# Patient Record
Sex: Female | Born: 1940 | Hispanic: Yes | Smoking: Never smoker
Health system: Southern US, Community
[De-identification: ages and names within clinical notes are randomized; demographics above are authoritative.]

## PROBLEM LIST (undated history)

## (undated) DIAGNOSIS — G629 Polyneuropathy, unspecified: Secondary | ICD-10-CM

## (undated) DIAGNOSIS — R0600 Dyspnea, unspecified: Secondary | ICD-10-CM

## (undated) DIAGNOSIS — K432 Incisional hernia without obstruction or gangrene: Secondary | ICD-10-CM

## (undated) DIAGNOSIS — R112 Nausea with vomiting, unspecified: Secondary | ICD-10-CM

## (undated) DIAGNOSIS — E785 Hyperlipidemia, unspecified: Secondary | ICD-10-CM

## (undated) DIAGNOSIS — Z5189 Encounter for other specified aftercare: Secondary | ICD-10-CM

## (undated) DIAGNOSIS — I4891 Unspecified atrial fibrillation: Secondary | ICD-10-CM

## (undated) DIAGNOSIS — I10 Essential (primary) hypertension: Secondary | ICD-10-CM

## (undated) DIAGNOSIS — I509 Heart failure, unspecified: Secondary | ICD-10-CM

## (undated) DIAGNOSIS — E559 Vitamin D deficiency, unspecified: Secondary | ICD-10-CM

## (undated) DIAGNOSIS — R413 Other amnesia: Secondary | ICD-10-CM

## (undated) DIAGNOSIS — C50919 Malignant neoplasm of unspecified site of unspecified female breast: Secondary | ICD-10-CM

## (undated) DIAGNOSIS — IMO0001 Reserved for inherently not codable concepts without codable children: Secondary | ICD-10-CM

## (undated) DIAGNOSIS — E039 Hypothyroidism, unspecified: Secondary | ICD-10-CM

## (undated) DIAGNOSIS — K635 Polyp of colon: Secondary | ICD-10-CM

## (undated) DIAGNOSIS — Z992 Dependence on renal dialysis: Secondary | ICD-10-CM

## (undated) DIAGNOSIS — I480 Paroxysmal atrial fibrillation: Secondary | ICD-10-CM

## (undated) DIAGNOSIS — K219 Gastro-esophageal reflux disease without esophagitis: Secondary | ICD-10-CM

## (undated) DIAGNOSIS — D649 Anemia, unspecified: Secondary | ICD-10-CM

## (undated) DIAGNOSIS — N186 End stage renal disease: Secondary | ICD-10-CM

## (undated) DIAGNOSIS — Z9889 Other specified postprocedural states: Secondary | ICD-10-CM

## (undated) DIAGNOSIS — R011 Cardiac murmur, unspecified: Secondary | ICD-10-CM

## (undated) DIAGNOSIS — M160 Bilateral primary osteoarthritis of hip: Secondary | ICD-10-CM

## (undated) HISTORY — DX: Hyperlipidemia, unspecified: E78.5

## (undated) HISTORY — PX: TUMOR EXCISION: SHX421

## (undated) HISTORY — DX: Hypothyroidism, unspecified: E03.9

## (undated) HISTORY — DX: Reserved for inherently not codable concepts without codable children: IMO0001

## (undated) HISTORY — DX: Polyp of colon: K63.5

## (undated) HISTORY — PX: EYE SURGERY: SHX253

## (undated) HISTORY — DX: Anemia, unspecified: D64.9

## (undated) HISTORY — DX: Heart failure, unspecified: I50.9

## (undated) HISTORY — DX: Encounter for other specified aftercare: Z51.89

## (undated) HISTORY — PX: ABDOMINAL HYSTERECTOMY: SHX81

## (undated) HISTORY — DX: Malignant neoplasm of unspecified site of unspecified female breast: C50.919

## (undated) HISTORY — DX: Vitamin D deficiency, unspecified: E55.9

## (undated) HISTORY — DX: Cardiac murmur, unspecified: R01.1

## (undated) HISTORY — PX: BREAST SURGERY: SHX581

## (undated) HISTORY — PX: APPENDECTOMY: SHX54

## (undated) HISTORY — PX: MASTECTOMY, MODIFIED RADICAL W/RECONSTRUCTION: SHX708

## (undated) HISTORY — PX: IUD REMOVAL: SHX5392

## (undated) HISTORY — DX: Incisional hernia without obstruction or gangrene: K43.2

## (undated) HISTORY — DX: Paroxysmal atrial fibrillation: I48.0

---

## 1996-10-30 HISTORY — PX: MASTECTOMY, MODIFIED RADICAL W/RECONSTRUCTION: SHX708

## 1998-12-21 ENCOUNTER — Encounter: Admission: RE | Admit: 1998-12-21 | Discharge: 1999-03-21 | Payer: Self-pay | Admitting: Endocrinology

## 1999-04-11 ENCOUNTER — Other Ambulatory Visit: Admission: RE | Admit: 1999-04-11 | Discharge: 1999-04-11 | Payer: Self-pay | Admitting: Obstetrics & Gynecology

## 1999-04-11 ENCOUNTER — Other Ambulatory Visit: Admission: RE | Admit: 1999-04-11 | Discharge: 1999-04-11 | Payer: Self-pay | Admitting: Gynecology

## 1999-09-09 ENCOUNTER — Encounter: Payer: Self-pay | Admitting: Oncology

## 1999-09-09 ENCOUNTER — Encounter: Admission: RE | Admit: 1999-09-09 | Discharge: 1999-09-09 | Payer: Self-pay | Admitting: Oncology

## 1999-09-17 ENCOUNTER — Encounter: Payer: Self-pay | Admitting: Emergency Medicine

## 1999-09-17 ENCOUNTER — Emergency Department (HOSPITAL_COMMUNITY): Admission: EM | Admit: 1999-09-17 | Discharge: 1999-09-17 | Payer: Self-pay | Admitting: Emergency Medicine

## 2000-04-25 ENCOUNTER — Other Ambulatory Visit: Admission: RE | Admit: 2000-04-25 | Discharge: 2000-04-25 | Payer: Self-pay | Admitting: Gynecology

## 2001-04-26 ENCOUNTER — Encounter: Admission: RE | Admit: 2001-04-26 | Discharge: 2001-04-26 | Payer: Self-pay | Admitting: Oncology

## 2001-04-26 ENCOUNTER — Encounter: Payer: Self-pay | Admitting: Oncology

## 2002-05-09 ENCOUNTER — Emergency Department (HOSPITAL_COMMUNITY): Admission: EM | Admit: 2002-05-09 | Discharge: 2002-05-09 | Payer: Self-pay | Admitting: Emergency Medicine

## 2002-06-02 ENCOUNTER — Encounter: Admission: RE | Admit: 2002-06-02 | Discharge: 2002-06-02 | Payer: Self-pay | Admitting: Oncology

## 2002-06-02 ENCOUNTER — Encounter: Payer: Self-pay | Admitting: Oncology

## 2002-06-13 ENCOUNTER — Other Ambulatory Visit: Admission: RE | Admit: 2002-06-13 | Discharge: 2002-06-13 | Payer: Self-pay | Admitting: Obstetrics and Gynecology

## 2002-11-20 ENCOUNTER — Encounter: Admission: RE | Admit: 2002-11-20 | Discharge: 2003-02-18 | Payer: Self-pay | Admitting: Endocrinology

## 2003-01-29 ENCOUNTER — Encounter: Payer: Self-pay | Admitting: Oncology

## 2003-01-29 ENCOUNTER — Ambulatory Visit (HOSPITAL_COMMUNITY): Admission: RE | Admit: 2003-01-29 | Discharge: 2003-01-29 | Payer: Self-pay | Admitting: Oncology

## 2003-02-05 ENCOUNTER — Encounter: Admission: RE | Admit: 2003-02-05 | Discharge: 2003-02-05 | Payer: Self-pay | Admitting: Obstetrics and Gynecology

## 2003-02-05 ENCOUNTER — Encounter: Payer: Self-pay | Admitting: Obstetrics and Gynecology

## 2003-03-28 ENCOUNTER — Encounter: Payer: Self-pay | Admitting: Emergency Medicine

## 2003-03-28 ENCOUNTER — Emergency Department (HOSPITAL_COMMUNITY): Admission: EM | Admit: 2003-03-28 | Discharge: 2003-03-28 | Payer: Self-pay | Admitting: Emergency Medicine

## 2003-10-01 ENCOUNTER — Emergency Department (HOSPITAL_COMMUNITY): Admission: EM | Admit: 2003-10-01 | Discharge: 2003-10-01 | Payer: Self-pay | Admitting: Emergency Medicine

## 2004-03-25 ENCOUNTER — Encounter: Admission: RE | Admit: 2004-03-25 | Discharge: 2004-03-25 | Payer: Self-pay | Admitting: Oncology

## 2005-02-11 ENCOUNTER — Emergency Department (HOSPITAL_COMMUNITY): Admission: EM | Admit: 2005-02-11 | Discharge: 2005-02-11 | Payer: Self-pay | Admitting: Emergency Medicine

## 2005-03-28 ENCOUNTER — Encounter: Admission: RE | Admit: 2005-03-28 | Discharge: 2005-03-28 | Payer: Self-pay | Admitting: Oncology

## 2005-04-10 ENCOUNTER — Ambulatory Visit: Payer: Self-pay | Admitting: Oncology

## 2005-06-28 ENCOUNTER — Encounter: Admission: RE | Admit: 2005-06-28 | Discharge: 2005-06-28 | Payer: Self-pay | Admitting: Plastic Surgery

## 2005-07-12 ENCOUNTER — Ambulatory Visit: Payer: Self-pay | Admitting: Oncology

## 2005-09-29 ENCOUNTER — Encounter: Admission: RE | Admit: 2005-09-29 | Discharge: 2005-09-29 | Payer: Self-pay | Admitting: Family Medicine

## 2006-04-05 ENCOUNTER — Ambulatory Visit: Payer: Self-pay | Admitting: Oncology

## 2006-04-10 LAB — CBC WITH DIFFERENTIAL/PLATELET
BASO%: 0.2 % (ref 0.0–2.0)
Basophils Absolute: 0 10*3/uL (ref 0.0–0.1)
EOS%: 1.3 % (ref 0.0–7.0)
Eosinophils Absolute: 0.1 10*3/uL (ref 0.0–0.5)
HGB: 11.4 g/dL — ABNORMAL LOW (ref 11.6–15.9)
MCHC: 33.7 g/dL (ref 32.0–36.0)
MCV: 88.5 fL (ref 81.0–101.0)
MONO#: 0.7 10*3/uL (ref 0.1–0.9)
MONO%: 10.3 % (ref 0.0–13.0)
WBC: 7.2 10*3/uL (ref 3.9–10.0)
lymph#: 1.7 10*3/uL (ref 0.9–3.3)

## 2006-04-10 LAB — RETICULOCYTES: RETIC #: 52.7 10*3/uL (ref 19.7–115.1)

## 2006-04-27 LAB — CBC WITH DIFFERENTIAL/PLATELET
LYMPH%: 21.4 % (ref 14.0–48.0)
MCH: 29.9 pg (ref 26.0–34.0)
MCHC: 33.7 g/dL (ref 32.0–36.0)
MONO%: 8.2 % (ref 0.0–13.0)
Platelets: 445 10*3/uL — ABNORMAL HIGH (ref 145–400)
RDW: 13.6 % (ref 11.3–14.5)
lymph#: 1.9 10*3/uL (ref 0.9–3.3)

## 2006-05-11 ENCOUNTER — Encounter: Admission: RE | Admit: 2006-05-11 | Discharge: 2006-05-11 | Payer: Self-pay | Admitting: Oncology

## 2006-08-03 ENCOUNTER — Ambulatory Visit: Payer: Self-pay | Admitting: Oncology

## 2007-02-11 ENCOUNTER — Ambulatory Visit: Payer: Self-pay | Admitting: Oncology

## 2007-02-14 LAB — CBC WITH DIFFERENTIAL/PLATELET
HCT: 35.6 % (ref 34.8–46.6)
HGB: 12.3 g/dL (ref 11.6–15.9)
LYMPH%: 35.4 % (ref 14.0–48.0)
MCH: 30.2 pg (ref 26.0–34.0)
MONO#: 0.7 10*3/uL (ref 0.1–0.9)
MONO%: 10.1 % (ref 0.0–13.0)
NEUT#: 3.6 10*3/uL (ref 1.5–6.5)
RBC: 4.07 10*6/uL (ref 3.70–5.32)
RDW: 13.1 % (ref 11.3–14.5)
lymph#: 2.4 10*3/uL (ref 0.9–3.3)

## 2007-03-04 ENCOUNTER — Ambulatory Visit: Payer: Self-pay | Admitting: Gastroenterology

## 2007-03-31 DIAGNOSIS — K635 Polyp of colon: Secondary | ICD-10-CM | POA: Insufficient documentation

## 2007-03-31 HISTORY — DX: Polyp of colon: K63.5

## 2007-04-02 ENCOUNTER — Encounter: Payer: Self-pay | Admitting: Gastroenterology

## 2007-04-02 ENCOUNTER — Ambulatory Visit: Payer: Self-pay | Admitting: Gastroenterology

## 2007-05-20 ENCOUNTER — Encounter: Admission: RE | Admit: 2007-05-20 | Discharge: 2007-05-20 | Payer: Self-pay | Admitting: Oncology

## 2008-02-11 ENCOUNTER — Ambulatory Visit: Payer: Self-pay | Admitting: Oncology

## 2008-04-23 ENCOUNTER — Ambulatory Visit: Payer: Self-pay | Admitting: Oncology

## 2008-05-20 ENCOUNTER — Encounter: Admission: RE | Admit: 2008-05-20 | Discharge: 2008-05-20 | Payer: Self-pay | Admitting: Oncology

## 2010-02-07 ENCOUNTER — Encounter: Admission: RE | Admit: 2010-02-07 | Discharge: 2010-02-07 | Payer: Self-pay | Admitting: Obstetrics and Gynecology

## 2010-02-07 ENCOUNTER — Emergency Department (HOSPITAL_COMMUNITY): Admission: EM | Admit: 2010-02-07 | Discharge: 2010-02-07 | Payer: Self-pay | Admitting: Emergency Medicine

## 2010-11-20 ENCOUNTER — Encounter: Payer: Self-pay | Admitting: Plastic Surgery

## 2010-11-21 ENCOUNTER — Encounter: Payer: Self-pay | Admitting: Oncology

## 2011-02-10 ENCOUNTER — Other Ambulatory Visit: Payer: Self-pay | Admitting: Obstetrics and Gynecology

## 2011-02-10 DIAGNOSIS — Z9010 Acquired absence of unspecified breast and nipple: Secondary | ICD-10-CM

## 2011-02-16 ENCOUNTER — Ambulatory Visit: Payer: Self-pay

## 2011-02-17 ENCOUNTER — Ambulatory Visit
Admission: RE | Admit: 2011-02-17 | Discharge: 2011-02-17 | Disposition: A | Discharge status: Home / Self Care | Payer: Medicare Other | Source: Ambulatory Visit | Attending: Obstetrics and Gynecology | Admitting: Obstetrics and Gynecology

## 2011-02-17 DIAGNOSIS — Z9010 Acquired absence of unspecified breast and nipple: Secondary | ICD-10-CM

## 2011-03-17 NOTE — Assessment & Plan Note (Signed)
Elizabeth OFFICE NOTE   Elizabeth Olsen, Olsen                       MRN:          [id number]  DATE:03/04/2007                            DOB:          October 09, 1941    PROBLEM:  For screening colonoscopy.   HISTORY:  This is a very nice 70 year old Hispanic female with a history  of breast cancer, diagnosed in 60.  She had mastectomy and subsequent  chemotherapy and has remained in remission.  She also has history of  insulin-dependent diabetes mellitus, hypothyroidism, hyperlipidemia, and  has had hysterectomy, bilateral tubal ligation, and appendectomy.   The patient has not had any previous colon screening.  She has no family  history of colon cancer or polyps that she is aware of.  She denies any  current difficulty with constipation, diarrhea, changes in her bowel  habits, abdominal pain or cramping, change in stool caliber and has not  noted any melena or hematochezia.   CURRENT MEDICATIONS:  1. Humalog 50/50 b.i.d.  2. Synthroid 1 daily.  3. Aspirin 81 mg, 2 p.o. daily.  4. Flax seed oil supplement.  5. Multivitamin.  6. Fish oil.  7. CholestOff supplement.  8. Calcium supplement.  9. Vitamin D supplement.  10.Symlin 10, 60 mg b.i.d.   ALLERGIES:  1. PENICILLIN.  2. SULFA.  3. ERYTHROMYCIN.  4. NYQUIL.   FAMILY HISTORY:  Negative for colon cancer and polyps.  Positive for  heart disease in her father and grandfather and alcoholism.   SOCIAL HISTORY:  The patient is married.  She is a homemaker, lives with  her husband.  She has three children.  She is a nonsmoker, nondrinker.   REVIEW OF SYSTEMS:  Pertinent for edema in her lower extremities and  allergy/sinus symptoms.  Other complete review of systems entirely  negative.   PHYSICAL EXAMINATION:  GENERAL:  Well-developed Hispanic female in no  acute distress.  VITAL SIGNS:  Weight 200.6.  Height 5 feet 3 inches.  Blood pressure  110/68.  Pulse 96.  HEENT:  Nontraumatic, normocephalic.  EOMI, PERRLA.  Sclerae anicteric.  NECK:  Supple.  CARDIOVASCULAR:  Regular rate and rhythm with S1 and S2.  No murmur,  rub, or gallop.  PULMONARY:  Clear to A & P.  ABDOMEN:  Soft.  Bowel sounds are active.  She is nontender.  There is  no mass or hepatosplenomegaly.  She has midline incisional scar and a  tram-flap scar on her abdomen.  RECTAL EXAM:  Not done today.   IMPRESSION:  1. This is a 70 year old female referred for colon neoplasia      surveillance.  2. Personal history of breast cancer, in remission.  3. Insulin-dependent diabetes mellitus, type 2.   PLAN:  1. Schedule colonoscopy at her convenience.  The procedure, risks, and      alternatives were discussed with her today.  2. Adjust insulin regimen for colonoscopy prep.      Nicoletta Ba, PA-C  Electronically Signed      Pricilla Riffle. Fuller Plan, MD, [facility]  Electronically Signed   AE/MedQ  DD: 03/04/2007  DT: 03/04/2007  Job #: [id number]   cc:   Izola Price. Benay Spice, M.D.

## 2011-10-03 ENCOUNTER — Encounter (INDEPENDENT_AMBULATORY_CARE_PROVIDER_SITE_OTHER): Payer: Medicare Other | Admitting: Ophthalmology

## 2011-10-31 HISTORY — PX: COLONOSCOPY: SHX174

## 2012-01-11 ENCOUNTER — Other Ambulatory Visit: Payer: Self-pay | Admitting: Obstetrics and Gynecology

## 2012-01-11 DIAGNOSIS — N63 Unspecified lump in unspecified breast: Secondary | ICD-10-CM

## 2012-01-17 ENCOUNTER — Ambulatory Visit
Admission: RE | Admit: 2012-01-17 | Discharge: 2012-01-17 | Disposition: A | Discharge status: Home / Self Care | Payer: Medicare Other | Source: Ambulatory Visit | Attending: Obstetrics and Gynecology | Admitting: Obstetrics and Gynecology

## 2012-01-17 ENCOUNTER — Other Ambulatory Visit: Payer: Self-pay | Admitting: Obstetrics and Gynecology

## 2012-01-17 DIAGNOSIS — N6489 Other specified disorders of breast: Secondary | ICD-10-CM | POA: Diagnosis not present

## 2012-01-17 DIAGNOSIS — N63 Unspecified lump in unspecified breast: Secondary | ICD-10-CM

## 2012-01-17 DIAGNOSIS — Z853 Personal history of malignant neoplasm of breast: Secondary | ICD-10-CM | POA: Diagnosis not present

## 2012-01-19 ENCOUNTER — Ambulatory Visit
Admission: RE | Admit: 2012-01-19 | Discharge: 2012-01-19 | Disposition: A | Discharge status: Home / Self Care | Payer: Medicare Other | Source: Ambulatory Visit | Attending: Obstetrics and Gynecology | Admitting: Obstetrics and Gynecology

## 2012-01-19 DIAGNOSIS — N63 Unspecified lump in unspecified breast: Secondary | ICD-10-CM

## 2012-01-19 DIAGNOSIS — Z853 Personal history of malignant neoplasm of breast: Secondary | ICD-10-CM | POA: Diagnosis not present

## 2012-01-19 DIAGNOSIS — N6039 Fibrosclerosis of unspecified breast: Secondary | ICD-10-CM | POA: Diagnosis not present

## 2012-01-23 ENCOUNTER — Other Ambulatory Visit: Payer: Medicare Other

## 2012-01-24 ENCOUNTER — Encounter: Payer: Self-pay | Admitting: Gastroenterology

## 2012-02-20 ENCOUNTER — Encounter: Payer: Self-pay | Admitting: Gastroenterology

## 2012-02-28 DIAGNOSIS — E1142 Type 2 diabetes mellitus with diabetic polyneuropathy: Secondary | ICD-10-CM | POA: Diagnosis not present

## 2012-02-28 DIAGNOSIS — E1149 Type 2 diabetes mellitus with other diabetic neurological complication: Secondary | ICD-10-CM | POA: Diagnosis not present

## 2012-02-28 DIAGNOSIS — E11319 Type 2 diabetes mellitus with unspecified diabetic retinopathy without macular edema: Secondary | ICD-10-CM | POA: Diagnosis not present

## 2012-02-28 DIAGNOSIS — E039 Hypothyroidism, unspecified: Secondary | ICD-10-CM | POA: Diagnosis not present

## 2012-03-12 DIAGNOSIS — R209 Unspecified disturbances of skin sensation: Secondary | ICD-10-CM | POA: Diagnosis not present

## 2012-03-13 DIAGNOSIS — E1149 Type 2 diabetes mellitus with other diabetic neurological complication: Secondary | ICD-10-CM | POA: Diagnosis not present

## 2012-03-13 DIAGNOSIS — E11319 Type 2 diabetes mellitus with unspecified diabetic retinopathy without macular edema: Secondary | ICD-10-CM | POA: Diagnosis not present

## 2012-04-02 ENCOUNTER — Encounter (INDEPENDENT_AMBULATORY_CARE_PROVIDER_SITE_OTHER): Payer: Medicare Other | Admitting: Ophthalmology

## 2012-04-15 ENCOUNTER — Ambulatory Visit (AMBULATORY_SURGERY_CENTER): Payer: Medicare Other | Admitting: *Deleted

## 2012-04-15 VITALS — Ht 63.0 in | Wt 168.0 lb

## 2012-04-15 DIAGNOSIS — Z1211 Encounter for screening for malignant neoplasm of colon: Secondary | ICD-10-CM

## 2012-04-15 DIAGNOSIS — Z860100 Personal history of colon polyps, unspecified: Secondary | ICD-10-CM

## 2012-04-15 MED ORDER — MOVIPREP 100 G PO SOLR: ORAL | Status: DC

## 2012-04-26 ENCOUNTER — Encounter: Payer: Self-pay | Admitting: Gastroenterology

## 2012-04-26 ENCOUNTER — Ambulatory Visit (AMBULATORY_SURGERY_CENTER): Payer: Medicare Other | Admitting: Gastroenterology

## 2012-04-26 VITALS — BP 150/90 | HR 86 | Temp 96.3°F | Resp 16 | Ht 63.0 in | Wt 168.0 lb

## 2012-04-26 DIAGNOSIS — Z860100 Personal history of colon polyps, unspecified: Secondary | ICD-10-CM

## 2012-04-26 DIAGNOSIS — D126 Benign neoplasm of colon, unspecified: Secondary | ICD-10-CM

## 2012-04-26 DIAGNOSIS — Z1211 Encounter for screening for malignant neoplasm of colon: Secondary | ICD-10-CM

## 2012-04-26 MED ORDER — SODIUM CHLORIDE 0.9 % IV SOLN: 500.0000 mL | INTRAVENOUS | Status: DC

## 2012-04-26 NOTE — Patient Instructions (Addendum)
Discharge instructions given with verbal understanding. Handouts on polyps given. Resume previous medications. YOU HAD AN ENDOSCOPIC PROCEDURE TODAY AT [facility]: Refer to the procedure report that was given to you for any specific questions about what was found during the examination.  If the procedure report does not answer your questions, please call your gastroenterologist to clarify.  If you requested that your care partner not be given the details of your procedure findings, then the procedure report has been included in a sealed envelope for you to review at your convenience later.  YOU SHOULD EXPECT: Some feelings of bloating in the abdomen. Passage of more gas than usual.  Walking can help get rid of the air that was put into your GI tract during the procedure and reduce the bloating. If you had a lower endoscopy (such as a colonoscopy or flexible sigmoidoscopy) you may notice spotting of blood in your stool or on the toilet paper. If you underwent a bowel prep for your procedure, then you may not have a normal bowel movement for a few days.  DIET: Your first meal following the procedure should be a light meal and then it is ok to progress to your normal diet.  A half-sandwich or bowl of soup is an example of a good first meal.  Heavy or fried foods are harder to digest and may make you feel nauseous or bloated.  Likewise meals heavy in dairy and vegetables can cause extra gas to form and this can also increase the bloating.  Drink plenty of fluids but you should avoid alcoholic beverages for 24 hours.  ACTIVITY: Your care partner should take you home directly after the procedure.  You should plan to take it easy, moving slowly for the rest of the day.  You can resume normal activity the day after the procedure however you should NOT DRIVE or use heavy machinery for 24 hours (because of the sedation medicines used during the test).    SYMPTOMS TO REPORT IMMEDIATELY: A  gastroenterologist can be reached at any hour.  During normal business hours, 8:30 AM to 5:00 PM Monday through Friday, call [phone].  After hours and on weekends, please call the GI answering service at [phone] who will take a message and have the physician on call contact you.   Following lower endoscopy (colonoscopy or flexible sigmoidoscopy):  Excessive amounts of blood in the stool  Significant tenderness or worsening of abdominal pains  Swelling of the abdomen that is new, acute  Fever of 100F or higher  FOLLOW UP: If any biopsies were taken you will be contacted by phone or by letter within the next 1-3 weeks.  Call your gastroenterologist if you have not heard about the biopsies in 3 weeks.  Our staff will call the home number listed on your records the next business day following your procedure to check on you and address any questions or concerns that you may have at that time regarding the information given to you following your procedure. This is a courtesy call and so if there is no answer at the home number and we have not heard from you through the emergency physician on call, we will assume that you have returned to your regular daily activities without incident.  SIGNATURES/CONFIDENTIALITY: You and/or your care partner have signed paperwork which will be entered into your electronic medical record.  These signatures attest to the fact that that the information above on your After Visit Summary has  been reviewed and is understood.  Full responsibility of the confidentiality of this discharge information lies with you and/or your care-partner.  

## 2012-04-26 NOTE — Progress Notes (Signed)
Patient did not experience any of the following events: a burn prior to discharge; a fall within the facility; wrong site/side/patient/procedure/implant event; or a hospital transfer or hospital admission upon discharge from the facility. (G8907) Patient did not have preoperative order for IV antibiotic SSI prophylaxis. (G8918)  

## 2012-04-26 NOTE — Op Note (Signed)
[address] [place], [place]  [id number]  COLONOSCOPY PROCEDURE REPORT  PATIENT:  Elizabeth Olsen, Elizabeth Olsen  MR#:  [id number] BIRTHDATE:  [birth year], 20 yrs. old  GENDER:  female ENDOSCOPIST:  Norberto Sorenson T. Fuller Plan, MD, [facility]  PROCEDURE DATE:  04/26/2012 PROCEDURE:  Colonoscopy with snare polypectomy ASA CLASS:  Class II INDICATIONS:  1) surveillance and high-risk screening  2) history of pre-cancerous (adenomatous) colon polyps: 03/2007 MEDICATIONS:   MAC sedation, administered by CRNA, propofol (Diprivan) 150 mg IV DESCRIPTION OF PROCEDURE:   After the risks benefits and alternatives of the procedure were thoroughly explained, informed consent was obtained.  Digital rectal exam was performed and revealed no abnormalities.   The LB CF-[id number] endoscope was introduced through the anus and advanced to the cecum, which was identified by both the appendix and ileocecal valve, without limitations.  The quality of the prep was excellent, using MoviPrep.  The instrument was then slowly withdrawn as the colon was fully examined. <<PROCEDUREIMAGES>> FINDINGS:  A sessile polyp was found in the mid transverse colon. It was 8 mm in size. Polyp was snared, then cauterized with monopolar cautery. Retrieval was successful. A sessile polyp was found in the mid transverse colon. It was 4 mm in size. Polyp was snared without cautery. Retrieval was successful. Otherwise normal colonoscopy without other polyps, masses, vascular ectasias, or inflammatory changes.  Retroflexed views in the rectum revealed no abnormalities.  The time to cecum =  4  minutes. The scope was then withdrawn (time =  10  min) from the patient and the procedure completed.  COMPLICATIONS:  None  ENDOSCOPIC IMPRESSION: 1) 8 mm sessile polyp in the mid transverse colon 2) 4 mm sessile polyp in the mid transverse colon  RECOMMENDATIONS: 1) Await pathology results 2) Repeat Colonoscopy in 5 years.  Elizabeth Olsen.  Fuller Plan, MD, [facility]  n. eSIGNED:   [name]. Elizabeth Olsen at 04/26/2012 08:54 AM  Marion Downer, [id number]

## 2012-04-29 ENCOUNTER — Telehealth: Payer: Self-pay

## 2012-04-29 NOTE — Telephone Encounter (Signed)
  Follow up Call-  Call back number 04/26/2012  Post procedure Call Back phone  # [phone]  Permission to leave phone message Yes     Patient questions:  Do you have a fever, pain , or abdominal swelling? no Pain Score  0 *  Have you tolerated food without any problems? yes  Have you been able to return to your normal activities? yes  Do you have any questions about your discharge instructions: Diet   no Medications  no Follow up visit  no  Do you have questions or concerns about your Care? no  Actions: * If pain score is 4 or above: No action needed, pain <4.

## 2012-05-05 ENCOUNTER — Encounter: Payer: Self-pay | Admitting: Gastroenterology

## 2012-05-15 DIAGNOSIS — E1142 Type 2 diabetes mellitus with diabetic polyneuropathy: Secondary | ICD-10-CM | POA: Diagnosis not present

## 2012-05-15 DIAGNOSIS — E1149 Type 2 diabetes mellitus with other diabetic neurological complication: Secondary | ICD-10-CM | POA: Diagnosis not present

## 2012-06-27 DIAGNOSIS — E1149 Type 2 diabetes mellitus with other diabetic neurological complication: Secondary | ICD-10-CM | POA: Diagnosis not present

## 2012-06-27 DIAGNOSIS — D126 Benign neoplasm of colon, unspecified: Secondary | ICD-10-CM | POA: Diagnosis not present

## 2012-06-27 DIAGNOSIS — E785 Hyperlipidemia, unspecified: Secondary | ICD-10-CM | POA: Diagnosis not present

## 2012-06-27 DIAGNOSIS — E559 Vitamin D deficiency, unspecified: Secondary | ICD-10-CM | POA: Diagnosis not present

## 2012-06-27 DIAGNOSIS — K7689 Other specified diseases of liver: Secondary | ICD-10-CM | POA: Diagnosis not present

## 2012-06-27 DIAGNOSIS — E039 Hypothyroidism, unspecified: Secondary | ICD-10-CM | POA: Diagnosis not present

## 2012-07-11 DIAGNOSIS — E1142 Type 2 diabetes mellitus with diabetic polyneuropathy: Secondary | ICD-10-CM | POA: Diagnosis not present

## 2012-07-11 DIAGNOSIS — E1149 Type 2 diabetes mellitus with other diabetic neurological complication: Secondary | ICD-10-CM | POA: Diagnosis not present

## 2012-07-11 DIAGNOSIS — R809 Proteinuria, unspecified: Secondary | ICD-10-CM | POA: Diagnosis not present

## 2012-07-25 DIAGNOSIS — D239 Other benign neoplasm of skin, unspecified: Secondary | ICD-10-CM | POA: Diagnosis not present

## 2012-07-25 DIAGNOSIS — L821 Other seborrheic keratosis: Secondary | ICD-10-CM | POA: Diagnosis not present

## 2012-07-25 DIAGNOSIS — D1801 Hemangioma of skin and subcutaneous tissue: Secondary | ICD-10-CM | POA: Diagnosis not present

## 2012-07-25 DIAGNOSIS — L819 Disorder of pigmentation, unspecified: Secondary | ICD-10-CM | POA: Diagnosis not present

## 2012-07-25 DIAGNOSIS — L570 Actinic keratosis: Secondary | ICD-10-CM | POA: Diagnosis not present

## 2012-08-02 DIAGNOSIS — Z23 Encounter for immunization: Secondary | ICD-10-CM | POA: Diagnosis not present

## 2012-09-04 ENCOUNTER — Encounter (HOSPITAL_COMMUNITY): Payer: Self-pay

## 2012-09-04 ENCOUNTER — Emergency Department (INDEPENDENT_AMBULATORY_CARE_PROVIDER_SITE_OTHER)
Admission: EM | Admit: 2012-09-04 | Discharge: 2012-09-04 | Disposition: A | Discharge status: Home / Self Care | Payer: Medicare Other | Source: Home / Self Care

## 2012-09-04 DIAGNOSIS — I10 Essential (primary) hypertension: Secondary | ICD-10-CM | POA: Diagnosis not present

## 2012-09-04 DIAGNOSIS — R079 Chest pain, unspecified: Secondary | ICD-10-CM

## 2012-09-04 NOTE — ED Provider Notes (Signed)
Medical screening examination/treatment/procedure(s) were performed by non-physician practitioner and as supervising physician I was immediately available for consultation/collaboration.  Burnett Kanaris, MD 09/04/12 1421

## 2012-09-04 NOTE — ED Notes (Addendum)
States she has had a "burning sensation " left lateral rib area ; "I think it's inside; I think it's my heart" history of mastectomy; was with a client acting as an interpreter, when she had her BP checked, and had an elevated reading. Diabetic, has not eaten breakfast or lunch. NAD, w/d/color good, calm , conversant  "I'm getting ready to go on a trip to [place], and I want to be sure my heart is okay" Relates a lot of recent stress

## 2012-09-04 NOTE — ED Provider Notes (Signed)
History     CSN: [id number]  Arrival date & time 09/04/12  1119   None     Chief Complaint  Patient presents with  . Hypertension    (Consider location/radiation/quality/duration/timing/severity/associated sxs/prior treatment) HPI Comments: 71 year old pleasant female who appears at least 10 years younger presents with concerns about her blood pressure. She has a history of type 2 diabetes for which is treated with insulin and  Bydureon. When she was taking off into the doctor's office today she decided to get her blood pressure checked as well the readings were 182/102, and later repeated was 172/92. She is not experiencing any symptoms associated with blood pressure change except for the fact she just has not been feeling as well as she thought she should. She denies problems with vision speech hearing swallowing she denies heaviness tightness fullness or pressure of the chest although she does say that for several months she been having intermittent femoral discomfort on the left side of her chest where she had her breast cancer. She denies shortness of breath or DOE or PND, diaphoresis, nausea, vomiting. She denies current edema although she does have puffy ankles for the end of the day after being up and about walking. She recently placed on Altace 2.5 mg for proteinuria by her PCP, as she is on no other antihypertensives and this dose is subtherapeutic for hypertension.   Past Medical History  Diagnosis Date  . Diabetes mellitus   . Thyroid disease   . Blood transfusion   . Breast cancer 15 years ago  . Heart murmur     as child  . Colon polyp 03/2007    adenomatous    Past Surgical History  Procedure Date  . Mastectomy, modified radical w/reconstruction 15 years ago    left  . Abdominal hysterectomy   . Iud removal   . Appendectomy     Family History  Problem Relation Age of Onset  . Colon cancer Neg Hx     History  Substance Use Topics  . Smoking status: Never  Smoker   . Smokeless tobacco: Never Used  . Alcohol Use: No    OB History    Grav Para Term Preterm Abortions TAB SAB Ect Mult Living                  Review of Systems  Constitutional: Negative for fever, activity change and fatigue.  HENT: Negative.   Respiratory: Negative for cough, choking, chest tightness, shortness of breath and wheezing.   Cardiovascular: Positive for chest pain. Negative for palpitations and leg swelling.  Gastrointestinal: Negative.   Genitourinary: Negative.   Musculoskeletal: Negative.   Skin: Negative for color change, pallor and rash.  Neurological: Negative.   Psychiatric/Behavioral: Negative.     Allergies  Erythromycin; Penicillins; Nyquil; and Sulfa antibiotics  Home Medications   Current Outpatient Rx  Name  Route  Sig  Dispense  Refill  . BYDUREON 2 MG [place] SUSR   Injection   Inject 1 each as directed once a week. On Tuesday         . FENOFIBRATE 160 MG PO TABS   Oral   Take 160 mg by mouth daily.         . INSULIN LISPRO (HUMAN) 100 UNIT/ML [place] SOLN   Subcutaneous   Inject into the skin 3 (three) times daily before meals.         Marland Kitchen LEVEMIR FLEXPEN 100 UNIT/ML  SOLN   Injection   Inject  1 each as directed Daily.         Marland Kitchen LOVAZA 1 G PO CAPS   Oral   Take 2 capsules by mouth Twice daily.         Marland Kitchen RAMIPRIL 1.25 MG PO CAPS   Oral   Take 1.25 mg by mouth daily.         Marland Kitchen VITAMIN D (ERGOCALCIFEROL) 50000 UNITS PO CAPS   Oral   Take 1 capsule by mouth 2 (two) times a week.         . BD PEN NEEDLE MINI U/F 31G X 5 MM MISC               . SYNTHROID 88 MCG PO TABS   Oral   Take 1 tablet by mouth Daily. 6 days week/not on sundays           BP 178/84  Pulse 89  Temp 98 F (36.7 C) (Oral)  Resp 20  SpO2 96%  Physical Exam  Constitutional: She is oriented to person, place, and time. She appears well-developed and well-nourished. No distress.  Eyes: EOM are normal. Pupils are equal, round, and  reactive to light.  Neck: Normal range of motion. Neck supple.  Cardiovascular: Normal rate.   Murmur heard. Pulmonary/Chest: Effort normal and breath sounds normal. No respiratory distress. She has no wheezes. She has no rales.  Abdominal: Soft. There is no tenderness.  Musculoskeletal: Normal range of motion.       There is mild bilateral ankle puffiness, nonpitting  Lymphadenopathy:    She has no cervical adenopathy.  Neurological: She is alert and oriented to person, place, and time. No cranial nerve deficit.  Skin: Skin is warm and dry.  Psychiatric: She has a normal mood and affect.    ED Course  Procedures (including critical care time)  Labs Reviewed - No data to display No results found.   1. HTN (hypertension), benign   2. Chest pain       MDM  She is unable to describe her chest discomfort. She states it does not feel like a heaviness, tightness, fullness, pressure, or squeezing. It is quite fleeting and occurs every 4-5 days. She said she has had this sensation for several months off and on and there are no changes. She is currently on Altace 2.5 mg however I explained to her it was not in her best interest to me to start any medication without having more information specifically her lab work. She had an appointment with her PCP at 3:30 today and decided to come here instead. She is asymptomatic in association with her hypertension. She agrees that she will call her physician within the hour to see if that appointment time still open and to ask if she can at least be treated over the phone by her PCP for her hypertension she cannot be seen in the office. EKG: Normal sinus rhythm, no ST-T changes, no ischemic changes.          Elizabeth Napoleon, NP 09/04/12 1407

## 2012-09-12 DIAGNOSIS — E1149 Type 2 diabetes mellitus with other diabetic neurological complication: Secondary | ICD-10-CM | POA: Diagnosis not present

## 2012-09-12 DIAGNOSIS — M79609 Pain in unspecified limb: Secondary | ICD-10-CM | POA: Diagnosis not present

## 2012-09-27 DIAGNOSIS — M79609 Pain in unspecified limb: Secondary | ICD-10-CM | POA: Diagnosis not present

## 2012-10-28 DIAGNOSIS — E1149 Type 2 diabetes mellitus with other diabetic neurological complication: Secondary | ICD-10-CM | POA: Diagnosis not present

## 2012-10-28 DIAGNOSIS — K7689 Other specified diseases of liver: Secondary | ICD-10-CM | POA: Diagnosis not present

## 2012-10-28 DIAGNOSIS — R809 Proteinuria, unspecified: Secondary | ICD-10-CM | POA: Diagnosis not present

## 2012-10-28 DIAGNOSIS — E1142 Type 2 diabetes mellitus with diabetic polyneuropathy: Secondary | ICD-10-CM | POA: Diagnosis not present

## 2012-10-31 DIAGNOSIS — Z1289 Encounter for screening for malignant neoplasm of other sites: Secondary | ICD-10-CM | POA: Diagnosis not present

## 2012-11-04 DIAGNOSIS — J019 Acute sinusitis, unspecified: Secondary | ICD-10-CM | POA: Diagnosis not present

## 2012-11-06 ENCOUNTER — Emergency Department (HOSPITAL_COMMUNITY)
Admission: EM | Admit: 2012-11-06 | Discharge: 2012-11-06 | Disposition: A | Discharge status: Home / Self Care | Payer: Medicare Other | Attending: Emergency Medicine | Admitting: Emergency Medicine

## 2012-11-06 DIAGNOSIS — Z8679 Personal history of other diseases of the circulatory system: Secondary | ICD-10-CM | POA: Diagnosis not present

## 2012-11-06 DIAGNOSIS — Z853 Personal history of malignant neoplasm of breast: Secondary | ICD-10-CM | POA: Diagnosis not present

## 2012-11-06 DIAGNOSIS — E119 Type 2 diabetes mellitus without complications: Secondary | ICD-10-CM | POA: Diagnosis not present

## 2012-11-06 DIAGNOSIS — Z794 Long term (current) use of insulin: Secondary | ICD-10-CM | POA: Diagnosis not present

## 2012-11-06 DIAGNOSIS — H6690 Otitis media, unspecified, unspecified ear: Secondary | ICD-10-CM | POA: Insufficient documentation

## 2012-11-06 DIAGNOSIS — E079 Disorder of thyroid, unspecified: Secondary | ICD-10-CM | POA: Insufficient documentation

## 2012-11-06 DIAGNOSIS — Z79899 Other long term (current) drug therapy: Secondary | ICD-10-CM | POA: Insufficient documentation

## 2012-11-06 DIAGNOSIS — Z860100 Personal history of colon polyps, unspecified: Secondary | ICD-10-CM | POA: Insufficient documentation

## 2012-11-06 DIAGNOSIS — J019 Acute sinusitis, unspecified: Secondary | ICD-10-CM | POA: Diagnosis not present

## 2012-11-06 DIAGNOSIS — R05 Cough: Secondary | ICD-10-CM | POA: Diagnosis not present

## 2012-11-06 DIAGNOSIS — J329 Chronic sinusitis, unspecified: Secondary | ICD-10-CM | POA: Diagnosis not present

## 2012-11-06 DIAGNOSIS — H65199 Other acute nonsuppurative otitis media, unspecified ear: Secondary | ICD-10-CM | POA: Diagnosis not present

## 2012-11-06 MED ORDER — ANTIPYRINE-BENZOCAINE 5.4-1.4 % OT SOLN: 3.0000 [drp] | OTIC | Status: DC | PRN

## 2012-11-06 MED ORDER — FLUTICASONE PROPIONATE 50 MCG/ACT NA SUSP: 2.0000 | Freq: Every day | NASAL | Status: DC

## 2012-11-06 NOTE — ED Notes (Signed)
Pt states that she has been having R ear pain with headache and neck pain.  No distress noted.

## 2012-11-06 NOTE — ED Provider Notes (Signed)
History     CSN: [id number]  Arrival date & time 11/06/12  0740   First MD Initiated Contact with Patient 11/06/12 [phone]      Chief Complaint  Patient presents with  . Otalgia    (Consider location/radiation/quality/duration/timing/severity/associated sxs/prior treatment) HPI Comments: Patient comes to the ER for evaluation of right ear pain which began last night. Patient has been sick for several days with sinus congestion and cough. She was seen in urgent care 2 days ago it started on a Z-Pak. Patient reports that her cough and nasal pressure and congestion continue. Pain was severe through the night in the right ear and the right side of her head. She took a pain pill and hour ago and the pain is much better. There has not been any hearing loss or drainage.  Patient is a 72 y.o. female presenting with ear pain.  Otalgia Associated symptoms include cough.    Past Medical History  Diagnosis Date  . Diabetes mellitus   . Thyroid disease   . Blood transfusion   . Breast cancer 15 years ago  . Heart murmur     as child  . Colon polyp 03/2007    adenomatous    Past Surgical History  Procedure Date  . Mastectomy, modified radical w/reconstruction 15 years ago    left  . Abdominal hysterectomy   . Iud removal   . Appendectomy     Family History  Problem Relation Age of Onset  . Colon cancer Neg Hx     History  Substance Use Topics  . Smoking status: Never Smoker   . Smokeless tobacco: Never Used  . Alcohol Use: No    OB History    Grav Para Term Preterm Abortions TAB SAB Ect Mult Living                  Review of Systems  Constitutional: Negative for fever.  HENT: Positive for ear pain and congestion.   Respiratory: Positive for cough.     Allergies  Erythromycin; Penicillins; Nyquil; and Sulfa antibiotics  Home Medications   Current Outpatient Rx  Name  Route  Sig  Dispense  Refill  . BD PEN NEEDLE MINI U/F 31G X 5 MM MISC               .  BYDUREON 2 MG [place] SUSR   Injection   Inject 1 each as directed once a week. On Tuesday         . FENOFIBRATE 160 MG PO TABS   Oral   Take 160 mg by mouth daily.         . INSULIN LISPRO (HUMAN) 100 UNIT/ML [place] SOLN   Subcutaneous   Inject into the skin 3 (three) times daily before meals.         Marland Kitchen LEVEMIR FLEXPEN 100 UNIT/ML [place] SOLN   Injection   Inject 1 each as directed Daily.         Marland Kitchen LOVAZA 1 G PO CAPS   Oral   Take 2 capsules by mouth Twice daily.         Marland Kitchen RAMIPRIL 1.25 MG PO CAPS   Oral   Take 1.25 mg by mouth daily.         Marland Kitchen SYNTHROID 88 MCG PO TABS   Oral   Take 1 tablet by mouth Daily. 6 days week/not on sundays         . VITAMIN D (ERGOCALCIFEROL) 50000 UNITS PO  CAPS   Oral   Take 1 capsule by mouth 2 (two) times a week.           There were no vitals taken for this visit.  Physical Exam  Constitutional: She is oriented to person, place, and time. She appears well-developed and well-nourished. No distress.  HENT:  Head: Normocephalic and atraumatic.  Right Ear: Hearing normal. Tympanic membrane is erythematous and retracted. Tympanic membrane is not perforated.  Nose: Right sinus exhibits maxillary sinus tenderness. Left sinus exhibits maxillary sinus tenderness.  Mouth/Throat: Oropharynx is clear and moist and mucous membranes are normal.  Eyes: Conjunctivae normal and EOM are normal. Pupils are equal, round, and reactive to light.  Neck: Normal range of motion. Neck supple. No Brudzinski's sign and no Kernig's sign noted.  Cardiovascular: Normal rate, regular rhythm, S1 normal and S2 normal.  Exam reveals no gallop and no friction rub.   No murmur heard. Pulmonary/Chest: Effort normal and breath sounds normal. No respiratory distress. She exhibits no tenderness.  Abdominal: Soft. Normal appearance and bowel sounds are normal. There is no hepatosplenomegaly. There is no tenderness. There is no rebound, no guarding, no tenderness at  McBurney's point and negative Murphy's sign. No hernia.  Musculoskeletal: Normal range of motion.  Lymphadenopathy:    She has no cervical adenopathy.  Neurological: She is alert and oriented to person, place, and time. She has normal strength. No cranial nerve deficit or sensory deficit. Coordination normal. GCS eye subscore is 4. GCS verbal subscore is 5. GCS motor subscore is 6.  Skin: Skin is warm, dry and intact. No rash noted. No cyanosis.  Psychiatric: She has a normal mood and affect. Her speech is normal and behavior is normal. Thought content normal.    ED Course  Procedures (including critical care time)  Labs Reviewed - No data to display No results found.   1. Otitis media   2. Sinusitis       MDM  Examination does reveal retraction, swelling and erythema of the right tympanic membrane consistent with otitis media. Patient just started a Z-Pak, should cover this. Patient will be given additional symptomatic treatment with Auralgan drops. She reports persistent thick nasal drainage as well, add Flonase.        Orpah Greek, MD 11/06/12 [phone]

## 2012-11-08 DIAGNOSIS — J209 Acute bronchitis, unspecified: Secondary | ICD-10-CM | POA: Diagnosis not present

## 2012-11-21 DIAGNOSIS — E1149 Type 2 diabetes mellitus with other diabetic neurological complication: Secondary | ICD-10-CM | POA: Diagnosis not present

## 2013-01-22 DIAGNOSIS — M6740 Ganglion, unspecified site: Secondary | ICD-10-CM | POA: Diagnosis not present

## 2013-01-22 DIAGNOSIS — R05 Cough: Secondary | ICD-10-CM | POA: Diagnosis not present

## 2013-01-22 DIAGNOSIS — R059 Cough, unspecified: Secondary | ICD-10-CM | POA: Diagnosis not present

## 2013-01-28 DIAGNOSIS — E1149 Type 2 diabetes mellitus with other diabetic neurological complication: Secondary | ICD-10-CM | POA: Diagnosis not present

## 2013-02-18 ENCOUNTER — Other Ambulatory Visit: Payer: Self-pay | Admitting: Orthopedic Surgery

## 2013-02-28 DIAGNOSIS — C50919 Malignant neoplasm of unspecified site of unspecified female breast: Secondary | ICD-10-CM | POA: Diagnosis not present

## 2013-02-28 DIAGNOSIS — E1149 Type 2 diabetes mellitus with other diabetic neurological complication: Secondary | ICD-10-CM | POA: Diagnosis not present

## 2013-02-28 DIAGNOSIS — E039 Hypothyroidism, unspecified: Secondary | ICD-10-CM | POA: Diagnosis not present

## 2013-02-28 DIAGNOSIS — E785 Hyperlipidemia, unspecified: Secondary | ICD-10-CM | POA: Diagnosis not present

## 2013-02-28 DIAGNOSIS — E1142 Type 2 diabetes mellitus with diabetic polyneuropathy: Secondary | ICD-10-CM | POA: Diagnosis not present

## 2013-02-28 DIAGNOSIS — E559 Vitamin D deficiency, unspecified: Secondary | ICD-10-CM | POA: Diagnosis not present

## 2013-02-28 DIAGNOSIS — E11319 Type 2 diabetes mellitus with unspecified diabetic retinopathy without macular edema: Secondary | ICD-10-CM | POA: Diagnosis not present

## 2013-02-28 DIAGNOSIS — D126 Benign neoplasm of colon, unspecified: Secondary | ICD-10-CM | POA: Diagnosis not present

## 2013-03-06 DIAGNOSIS — I10 Essential (primary) hypertension: Secondary | ICD-10-CM | POA: Diagnosis not present

## 2013-03-06 DIAGNOSIS — E1149 Type 2 diabetes mellitus with other diabetic neurological complication: Secondary | ICD-10-CM | POA: Diagnosis not present

## 2013-03-06 DIAGNOSIS — E785 Hyperlipidemia, unspecified: Secondary | ICD-10-CM | POA: Diagnosis not present

## 2013-03-17 ENCOUNTER — Encounter (HOSPITAL_BASED_OUTPATIENT_CLINIC_OR_DEPARTMENT_OTHER): Payer: Self-pay | Admitting: *Deleted

## 2013-03-17 ENCOUNTER — Encounter (HOSPITAL_BASED_OUTPATIENT_CLINIC_OR_DEPARTMENT_OTHER)
Admission: RE | Admit: 2013-03-17 | Discharge: 2013-03-17 | Disposition: A | Discharge status: Home / Self Care | Payer: Medicare Other | Source: Ambulatory Visit | Attending: Orthopedic Surgery | Admitting: Orthopedic Surgery

## 2013-03-17 DIAGNOSIS — Z881 Allergy status to other antibiotic agents status: Secondary | ICD-10-CM | POA: Diagnosis not present

## 2013-03-17 DIAGNOSIS — Z87828 Personal history of other (healed) physical injury and trauma: Secondary | ICD-10-CM | POA: Diagnosis not present

## 2013-03-17 DIAGNOSIS — Z880 Allergy status to penicillin: Secondary | ICD-10-CM | POA: Diagnosis not present

## 2013-03-17 DIAGNOSIS — I10 Essential (primary) hypertension: Secondary | ICD-10-CM | POA: Diagnosis not present

## 2013-03-17 DIAGNOSIS — M19049 Primary osteoarthritis, unspecified hand: Secondary | ICD-10-CM | POA: Diagnosis not present

## 2013-03-17 DIAGNOSIS — Z853 Personal history of malignant neoplasm of breast: Secondary | ICD-10-CM | POA: Diagnosis not present

## 2013-03-17 DIAGNOSIS — D2110 Benign neoplasm of connective and other soft tissue of unspecified upper limb, including shoulder: Secondary | ICD-10-CM | POA: Diagnosis not present

## 2013-03-17 DIAGNOSIS — M898X9 Other specified disorders of bone, unspecified site: Secondary | ICD-10-CM | POA: Diagnosis not present

## 2013-03-17 DIAGNOSIS — E079 Disorder of thyroid, unspecified: Secondary | ICD-10-CM | POA: Diagnosis not present

## 2013-03-17 DIAGNOSIS — Z889 Allergy status to unspecified drugs, medicaments and biological substances status: Secondary | ICD-10-CM | POA: Diagnosis not present

## 2013-03-17 DIAGNOSIS — E119 Type 2 diabetes mellitus without complications: Secondary | ICD-10-CM | POA: Diagnosis not present

## 2013-03-17 DIAGNOSIS — Z882 Allergy status to sulfonamides status: Secondary | ICD-10-CM | POA: Diagnosis not present

## 2013-03-17 LAB — BASIC METABOLIC PANEL
BUN: 21 mg/dL (ref 6–23)
Calcium: 9.4 mg/dL (ref 8.4–10.5)
Creatinine, Ser: 0.51 mg/dL (ref 0.50–1.10)
GFR calc Af Amer: >90 mL/min (ref >90)
GFR calc non Af Amer: >90 mL/min (ref >90)

## 2013-03-17 NOTE — Progress Notes (Signed)
Very nice lady-here for bmet-no cardiac problems

## 2013-03-19 ENCOUNTER — Encounter (HOSPITAL_BASED_OUTPATIENT_CLINIC_OR_DEPARTMENT_OTHER): Payer: Self-pay | Admitting: Certified Registered Nurse Anesthetist

## 2013-03-19 ENCOUNTER — Encounter (HOSPITAL_BASED_OUTPATIENT_CLINIC_OR_DEPARTMENT_OTHER): Payer: Self-pay | Admitting: Orthopedic Surgery

## 2013-03-19 ENCOUNTER — Ambulatory Visit (HOSPITAL_BASED_OUTPATIENT_CLINIC_OR_DEPARTMENT_OTHER)
Admission: RE | Admit: 2013-03-19 | Discharge: 2013-03-19 | Disposition: A | Discharge status: Home / Self Care | Payer: Medicare Other | Source: Ambulatory Visit | Attending: Orthopedic Surgery | Admitting: Orthopedic Surgery

## 2013-03-19 ENCOUNTER — Ambulatory Visit (HOSPITAL_BASED_OUTPATIENT_CLINIC_OR_DEPARTMENT_OTHER): Payer: Medicare Other | Admitting: Certified Registered Nurse Anesthetist

## 2013-03-19 ENCOUNTER — Encounter (HOSPITAL_BASED_OUTPATIENT_CLINIC_OR_DEPARTMENT_OTHER)
Admission: RE | Disposition: A | Discharge status: Home / Self Care | Payer: Self-pay | Source: Ambulatory Visit | Attending: Orthopedic Surgery

## 2013-03-19 DIAGNOSIS — D492 Neoplasm of unspecified behavior of bone, soft tissue, and skin: Secondary | ICD-10-CM | POA: Diagnosis not present

## 2013-03-19 DIAGNOSIS — Z87828 Personal history of other (healed) physical injury and trauma: Secondary | ICD-10-CM | POA: Diagnosis not present

## 2013-03-19 DIAGNOSIS — M7130 Other bursal cyst, unspecified site: Secondary | ICD-10-CM | POA: Diagnosis not present

## 2013-03-19 DIAGNOSIS — M19049 Primary osteoarthritis, unspecified hand: Secondary | ICD-10-CM | POA: Diagnosis not present

## 2013-03-19 DIAGNOSIS — D2110 Benign neoplasm of connective and other soft tissue of unspecified upper limb, including shoulder: Secondary | ICD-10-CM | POA: Diagnosis not present

## 2013-03-19 DIAGNOSIS — Z882 Allergy status to sulfonamides status: Secondary | ICD-10-CM | POA: Insufficient documentation

## 2013-03-19 DIAGNOSIS — M898X9 Other specified disorders of bone, unspecified site: Secondary | ICD-10-CM | POA: Insufficient documentation

## 2013-03-19 DIAGNOSIS — Z853 Personal history of malignant neoplasm of breast: Secondary | ICD-10-CM | POA: Insufficient documentation

## 2013-03-19 DIAGNOSIS — Z881 Allergy status to other antibiotic agents status: Secondary | ICD-10-CM | POA: Insufficient documentation

## 2013-03-19 DIAGNOSIS — E119 Type 2 diabetes mellitus without complications: Secondary | ICD-10-CM | POA: Insufficient documentation

## 2013-03-19 DIAGNOSIS — I10 Essential (primary) hypertension: Secondary | ICD-10-CM | POA: Insufficient documentation

## 2013-03-19 DIAGNOSIS — Z889 Allergy status to unspecified drugs, medicaments and biological substances status: Secondary | ICD-10-CM | POA: Insufficient documentation

## 2013-03-19 DIAGNOSIS — E079 Disorder of thyroid, unspecified: Secondary | ICD-10-CM | POA: Insufficient documentation

## 2013-03-19 DIAGNOSIS — Z880 Allergy status to penicillin: Secondary | ICD-10-CM | POA: Insufficient documentation

## 2013-03-19 HISTORY — DX: Essential (primary) hypertension: I10

## 2013-03-19 SURGERY — EXCISION METACARPAL MASS
Anesthesia: Regional | Site: Thumb | Laterality: Right | Wound class: Clean

## 2013-03-19 MED ORDER — 0.9 % SODIUM CHLORIDE (POUR BTL) OPTIME
TOPICAL | Status: DC | PRN
  Administered 2013-03-19: 100 mL

## 2013-03-19 MED ORDER — OXYCODONE-ACETAMINOPHEN 7.5-325 MG PO TABS: 1.0000 | ORAL_TABLET | ORAL | Status: DC | PRN

## 2013-03-19 MED ORDER — OXYCODONE HCL 5 MG/5ML PO SOLN: 5.0000 mg | Freq: Once | ORAL | Status: DC | PRN

## 2013-03-19 MED ORDER — MIDAZOLAM HCL 2 MG/2ML IJ SOLN: 1.0000 mg | INTRAMUSCULAR | Status: DC | PRN

## 2013-03-19 MED ORDER — FENTANYL CITRATE 0.05 MG/ML IJ SOLN: 50.0000 ug | INTRAMUSCULAR | Status: DC | PRN

## 2013-03-19 MED ORDER — FENTANYL CITRATE 0.05 MG/ML IJ SOLN: 25.0000 ug | INTRAMUSCULAR | Status: DC | PRN

## 2013-03-19 MED ORDER — CHLORHEXIDINE GLUCONATE 4 % EX LIQD: 60.0000 mL | Freq: Once | CUTANEOUS | Status: DC

## 2013-03-19 MED ORDER — PROPOFOL INFUSION 10 MG/ML OPTIME
INTRAVENOUS | Status: DC | PRN
  Administered 2013-03-19: 100 ug/kg/min via INTRAVENOUS

## 2013-03-19 MED ORDER — LIDOCAINE HCL (PF) 0.5 % IJ SOLN
INTRAMUSCULAR | Status: DC | PRN
  Administered 2013-03-19: 30 mL via INTRAVENOUS

## 2013-03-19 MED ORDER — CHLORHEXIDINE GLUCONATE 4 % EX LIQD
60.0000 mL | Freq: Once | CUTANEOUS | Status: DC
Start: 2013-03-19 — End: 2013-03-19

## 2013-03-19 MED ORDER — ONDANSETRON HCL 4 MG/2ML IJ SOLN
INTRAMUSCULAR | Status: DC | PRN
  Administered 2013-03-19: 4 mg via INTRAVENOUS

## 2013-03-19 MED ORDER — VANCOMYCIN HCL IN DEXTROSE 1-5 GM/200ML-% IV SOLN
1000.0000 mg | INTRAVENOUS | Status: AC
  Administered 2013-03-19: 1000 mg via INTRAVENOUS

## 2013-03-19 MED ORDER — BUPIVACAINE HCL (PF) 0.25 % IJ SOLN
INTRAMUSCULAR | Status: DC | PRN
  Administered 2013-03-19: 7.5 mL

## 2013-03-19 MED ORDER — FENTANYL CITRATE 0.05 MG/ML IJ SOLN
INTRAMUSCULAR | Status: DC | PRN
  Administered 2013-03-19 (×2): 50 ug via INTRAVENOUS

## 2013-03-19 MED ORDER — LACTATED RINGERS IV SOLN
INTRAVENOUS | Status: DC
  Administered 2013-03-19: 09:00:00 via INTRAVENOUS

## 2013-03-19 MED ORDER — OXYCODONE HCL 5 MG PO TABS: 5.0000 mg | ORAL_TABLET | Freq: Once | ORAL | Status: DC | PRN

## 2013-03-19 MED ORDER — MEPERIDINE HCL 25 MG/ML IJ SOLN: 6.2500 mg | INTRAMUSCULAR | Status: DC | PRN

## 2013-03-19 SURGICAL SUPPLY — 52 items
BANDAGE COBAN STERILE 2 (GAUZE/BANDAGES/DRESSINGS) IMPLANT
BANDAGE GAUZE ELAST BULKY 4 IN (GAUZE/BANDAGES/DRESSINGS) IMPLANT
BLADE MINI RND TIP GREEN BEAV (BLADE) IMPLANT
BLADE SURG 15 STRL LF DISP TIS (BLADE) ×1 IMPLANT
BLADE SURG 15 STRL SS (BLADE) ×1
BNDG COHESIVE 1X5 TAN STRL LF (GAUZE/BANDAGES/DRESSINGS) ×2 IMPLANT
BNDG COHESIVE 3X5 TAN STRL LF (GAUZE/BANDAGES/DRESSINGS) IMPLANT
BNDG ESMARK 4X9 LF (GAUZE/BANDAGES/DRESSINGS) IMPLANT
CHLORAPREP W/TINT 26ML (MISCELLANEOUS) ×2 IMPLANT
CLOTH BEACON ORANGE TIMEOUT ST (SAFETY) ×2 IMPLANT
CORDS BIPOLAR (ELECTRODE) ×2 IMPLANT
COVER MAYO STAND STRL (DRAPES) ×2 IMPLANT
COVER TABLE BACK 60X90 (DRAPES) ×2 IMPLANT
CUFF TOURNIQUET SINGLE 18IN (TOURNIQUET CUFF) ×2 IMPLANT
DECANTER SPIKE VIAL GLASS SM (MISCELLANEOUS) IMPLANT
DRAIN PENROSE 1/2X12 LTX STRL (WOUND CARE) IMPLANT
DRAPE EXTREMITY T 121X128X90 (DRAPE) ×2 IMPLANT
DRAPE SURG 17X23 STRL (DRAPES) ×2 IMPLANT
GAUZE XEROFORM 1X8 LF (GAUZE/BANDAGES/DRESSINGS) ×2 IMPLANT
GLOVE BIO SURGEON STRL SZ 6.5 (GLOVE) ×2 IMPLANT
GLOVE BIOGEL PI IND STRL 7.0 (GLOVE) ×1 IMPLANT
GLOVE BIOGEL PI IND STRL 8.5 (GLOVE) ×1 IMPLANT
GLOVE BIOGEL PI INDICATOR 7.0 (GLOVE) ×1
GLOVE BIOGEL PI INDICATOR 8.5 (GLOVE) ×1
GLOVE EXAM NITRILE MD LF STRL (GLOVE) ×2 IMPLANT
GLOVE SURG ORTHO 8.0 STRL STRW (GLOVE) ×2 IMPLANT
GOWN BRE IMP PREV XXLGXLNG (GOWN DISPOSABLE) IMPLANT
GOWN PREVENTION PLUS XLARGE (GOWN DISPOSABLE) IMPLANT
NDL SAFETY ECLIPSE 18X1.5 (NEEDLE) ×1 IMPLANT
NEEDLE 27GAX1X1/2 (NEEDLE) ×2 IMPLANT
NEEDLE HYPO 18GX1.5 SHARP (NEEDLE) ×1
NS IRRIG 1000ML POUR BTL (IV SOLUTION) ×2 IMPLANT
PACK BASIN DAY SURGERY FS (CUSTOM PROCEDURE TRAY) ×2 IMPLANT
PAD CAST 3X4 CTTN HI CHSV (CAST SUPPLIES) IMPLANT
PADDING CAST ABS 3INX4YD NS (CAST SUPPLIES)
PADDING CAST ABS 4INX4YD NS (CAST SUPPLIES) ×1
PADDING CAST ABS COTTON 3X4 (CAST SUPPLIES) IMPLANT
PADDING CAST ABS COTTON 4X4 ST (CAST SUPPLIES) ×1 IMPLANT
PADDING CAST COTTON 3X4 STRL (CAST SUPPLIES)
SPLINT FNGR BALL END 5/8X4.25 (SOFTGOODS) ×1 IMPLANT
SPLINT PLASTALUME BALL 4 1/4IN (SOFTGOODS) ×2
SPLINT PLASTER CAST XFAST 3X15 (CAST SUPPLIES) IMPLANT
SPLINT PLASTER XTRA FASTSET 3X (CAST SUPPLIES)
SPONGE GAUZE 4X4 12PLY (GAUZE/BANDAGES/DRESSINGS) ×2 IMPLANT
STOCKINETTE 4X48 STRL (DRAPES) ×2 IMPLANT
SUT VIC AB 4-0 P2 18 (SUTURE) IMPLANT
SUT VICRYL RAPID 5 0 P 3 (SUTURE) IMPLANT
SUT VICRYL RAPIDE 4/0 PS 2 (SUTURE) ×2 IMPLANT
SYR BULB 3OZ (MISCELLANEOUS) ×2 IMPLANT
SYR CONTROL 10ML LL (SYRINGE) ×2 IMPLANT
TOWEL OR 17X24 6PK STRL BLUE (TOWEL DISPOSABLE) ×4 IMPLANT
UNDERPAD 30X30 INCONTINENT (UNDERPADS AND DIAPERS) ×2 IMPLANT

## 2013-03-19 NOTE — Anesthesia Procedure Notes (Signed)
Anesthesia Regional Block:  Bier block (IV Regional)  Pre-Anesthetic Checklist: ,, timeout performed, Correct Patient, Correct Site, Correct Laterality, Correct Procedure,, site marked, surgical consent,, at surgeon's request  Laterality: Right     Needles:  Injection technique: Single-shot  Needle Type: Other      Needle Gauge: 22 and 22 G    Additional Needles: Bier block (IV Regional) Narrative:   Performed by: Personally   Bier block (IV Regional)

## 2013-03-19 NOTE — Anesthesia Preprocedure Evaluation (Signed)
Anesthesia Evaluation  Patient identified by MRN, date of birth, ID band Patient awake    Reviewed: Allergy & Precautions, H&P , NPO status   History of Anesthesia Complications Negative for: history of anesthetic complications  Airway Mallampati: II      Dental  (+) Teeth Intact   Pulmonary  breath sounds clear to auscultation        Cardiovascular hypertension, + Valvular Problems/Murmurs Rhythm:Regular Rate:Normal     Neuro/Psych    GI/Hepatic negative GI ROS, Neg liver ROS,   Endo/Other  diabetes  Renal/GU      Musculoskeletal   Abdominal   Peds  Hematology   Anesthesia Other Findings   Reproductive/Obstetrics                           Anesthesia Physical Anesthesia Plan  ASA: III  Anesthesia Plan: Bier Block and MAC   Post-op Pain Management:    Induction: Intravenous  Airway Management Planned: Simple Face Mask and Natural Airway  Additional Equipment:   Intra-op Plan:   Post-operative Plan:   Informed Consent: I have reviewed the patients History and Physical, chart, labs and discussed the procedure including the risks, benefits and alternatives for the proposed anesthesia with the patient or authorized representative who has indicated his/her understanding and acceptance.     Plan Discussed with: CRNA and Surgeon  Anesthesia Plan Comments:         Anesthesia Quick Evaluation

## 2013-03-19 NOTE — Anesthesia Postprocedure Evaluation (Signed)
  Anesthesia Post-op Note  Patient: Elizabeth Olsen  Procedure(s) Performed: Procedure(s): EXCISION TUMOR RIGHT THUMB; DEBRIDEMENT INTERPHALANGEAL RIGHT THUMB (Right)  Patient Location: PACU  Anesthesia Type:Regional  Level of Consciousness: awake  Airway and Oxygen Therapy: Patient Spontanous Breathing  Post-op Pain: mild  Post-op Assessment: Post-op Vital signs reviewed  Post-op Vital Signs: stable  Complications: No apparent anesthesia complications

## 2013-03-19 NOTE — Op Note (Signed)
Elizabeth Olsen, Elizabeth Olsen                ACCOUNT NO.:  [id number]  MEDICAL RECORD NO.:  [id number]  LOCATION:                                 FACILITY:  PHYSICIAN:  Daryll Brod, M.D.       DATE OF BIRTH:  07-07-1941  DATE OF PROCEDURE:  03/19/2013 DATE OF DISCHARGE:                              OPERATIVE REPORT   PREOPERATIVE DIAGNOSIS:  Mucoid tumor right thumb.  POSTOPERATIVE DIAGNOSIS:  Mucoid tumor right thumb.  OPERATION:  Excision of mucoid tumor; debridement of interphalangeal joint, right thumb.  SURGEON:  Daryll Brod, MD  ANESTHESIA:  Forearm-based IV regional with local metacarpal block.  ANESTHESIOLOGIST:  Ala Dach, M.D.  HISTORY:  The patient is a 72 year old female with a history of a large mass over the interphalangeal joint of her right thumb, this transilluminates.  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  She desires having this excised along with debridement of the IP joint.  Pre, peri, and postoperative course have been discussed along with risks and complications.  She is aware that there is no guarantee with the surgery; possibility of infection; recurrence of injury to arteries, nerves, tendons, incomplete relief of symptoms, dystrophy.  In the preoperative area, the patient is seen, the extremity marked by both patient and surgeon.  Antibiotic given.  PROCEDURE IN DETAIL:  The patient was brought to the operating room where a.  Forearm-based IV regional anesthetic was carried out without difficulty.  She was prepped using ChloraPrep in supine position with the right arm free.  A 3-minute dry time was allowed.  Time-out taken confirming the patient and procedure.  A curvilinear incision was made over the interphalangeal joint of the right thumb, carried down through subcutaneous tissue.  Bleeders were electrocauterized with bipolar.  A cyst was immediately encountered.  This was multiloculated loculated distally.  This  was removed with small rongeur taking care to protect the extensor portion and the tendon and the dorsal matrix of the nail bed.  An incision was then made on the radial aspect of the thumb opening the joint.  A large osteophyte was present dorsally on the middle proximal phalanx.  This was removed with a rongeur along with a complete synovectomy.  The extensor tendon was moderately elongated secondary to the swelling in the osteophyte.  This was, however, left intact.  The wound was copiously irrigated with saline.  The skin was then closed with interrupted 4-0 Vicryl Rapide sutures.  Specimen was sent to Pathology.  A metacarpal block was given 0.25% Marcaine without epinephrine, approximately 8 mL was used.  Sterile compressive dressing and splint to the IP joint and extension was applied.  On deflation of the tourniquet, remaining fingers pinked.  She was taken to the recovery room for observation in satisfactory condition.  She will be discharged home to return to [place] in 1 week on Vicodin.          ______________________________ Daryll Brod, M.D.     GK/MEDQ  D:  03/19/2013  T:  03/19/2013  Job:  [id number]

## 2013-03-19 NOTE — Op Note (Signed)
Dictation Number [id number]

## 2013-03-19 NOTE — H&P (Signed)
Elizabeth Olsen is a 72 year-old right-hand dominant female who comes in today with a complaint of a mass on the dorsal aspect right thumb IP joint for the past two months. She is referred by Dr. Alyson Ingles.  She has no history of injury. She has history of fracture three years ago to the thumb.  She has history of diabetes, thyroid problems, no history of gout. She is not complaining of any pain or discomfort with this.  She states that she does have mild dull aching type feeling of swelling and weakness to it.  Sensation is intact.  Activity seems to make it worse. Rest makes it better. She states that it has slightly enlarged.    ALLERGIES:    Penicillin, e-mycin, sulfa and NyQuil.  MEDICATIONS:    Levemir, Humalog, Synthroid, vitamin D, fenofibrate, verapamil.   SURGICAL HISTORY:     Hysterectomy and breast cancer.  FAMILY MEDICAL HISTORY:   Positive for diabetes, heart disease, otherwise negative.  SOCIAL HISTORY:     She does not smoke or drink.  She is married.  Homemaker.  REVIEW OF SYSTEMS:     Positive for breast cancer, glasses, cataracts, otherwise negative 14 points. Elizabeth Olsen is an 72 y.o. female.   Chief Complaint: Mucoid cyst rt thumb with DJD HPI: see above  Past Medical History  Diagnosis Date  . Diabetes mellitus   . Thyroid disease   . Blood transfusion   . Breast cancer 15 years ago  . Heart murmur     as child  . 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 Hypertension     Past Surgical History  Procedure Laterality Date  . Mastectomy, modified radical w/reconstruction  15 years ago    left-10 nodes out  . Abdominal hysterectomy    . Iud removal    . Appendectomy    . Tumor excision      lt neck    Family History  Problem Relation Age of Onset  . Colon cancer Neg Hx    Social History:  reports that she has never smoked. She has never used smokeless tobacco. She reports that she does not drink alcohol or use illicit drugs.  Allergies:  Allergies  Allergen  Reactions  . Erythromycin Swelling    "throat swelling"  . Penicillins Rash  . Nyquil (Pseudoeph-Doxylamine-Dm-Apap) Rash  . Sulfa Antibiotics Rash    No prescriptions prior to admission    Results for orders placed during the hospital encounter of 03/19/13 (from the past 48 hour(s))  BASIC METABOLIC PANEL     Status: Abnormal   Collection Time    03/17/13  4:00 PM      Result Value Range   Sodium 140  135 - 145 mEq/L   Potassium 4.3  3.5 - 5.1 mEq/L   Chloride 101  96 - 112 mEq/L   CO2 27  19 - 32 mEq/L   Glucose, Bld 143 (*) 70 - 99 mg/dL   BUN 21  6 - 23 mg/dL   Creatinine, Ser 0.51  0.50 - 1.10 mg/dL   Calcium 9.4  8.4 - 10.5 mg/dL   GFR calc non Af Amer >90  >90 mL/min   GFR calc Af Amer >90  >90 mL/min   Comment:            The eGFR has been calculated     using the CKD EPI equation.     This calculation has not been  validated in all clinical     situations.     eGFR's persistently     <90 mL/min signify     possible Chronic Kidney Disease.    No results found.   Pertinent items are noted in HPI.  Height 5\' 3"  (1.6 m), weight 73.029 kg (161 lb).  General appearance: alert, cooperative and appears stated age Head: Normocephalic, without obvious abnormality Neck: no JVD Resp: clear to auscultation bilaterally Cardio: regular rate and rhythm, S1, S2 normal, no murmur, click, rub or gallop GI: soft, non-tender; bowel sounds normal; no masses,  no organomegaly Extremities: extremities normal, atraumatic, no cyanosis or edema Pulses: 2+ and symmetric Skin: Skin color, texture, turgor normal. No rashes or lesions Neurologic: Grossly normal Incision/Wound: na  Assessment/Plan RADIOGRAPHS:     X-rays reveal degenerative arthritis at the IP joint of her right thumb.  DIAGNOSIS:       Mucoid cyst with degenerative arthritis, right thumb.  RECOMMENDATIONS/PLAN:   We have discussed the etiology of this with her along with various treatment alternatives.  We  would recommend surgical excision along with debridement of the joint.  She is aware of the possibility of infection, recurrence, injury to arteries, nerves, tendons, incomplete relief of symptoms and dystrophy.  She would like to schedule this after her vacation in May.  I will see her back following surgery. This will be scheduled at [facility] for excision cyst, debridement of IP joint right thumb.  Kmari Brian R 03/19/2013, 7:49 AM

## 2013-03-19 NOTE — Brief Op Note (Signed)
03/19/2013  10:08 AM  PATIENT:  Elizabeth Olsen  72 y.o. female  PRE-OPERATIVE DIAGNOSIS:  MUCOID TUMOR RIGHT THUMB DEGENERATIVE JOINT DISEASE RIGHT THUMB INTERPHALANGEAL  POST-OPERATIVE DIAGNOSIS:  MUCOID TUMOR RIGHT THUMB DEGENERATIVE JOINT DISEASE RIGHT THUMB   PROCEDURE:  Procedure(s): EXCISION TUMOR RIGHT THUMB; DEBRIDEMENT INTERPHALANGEAL RIGHT THUMB (Right)  SURGEON:  Surgeon(s) and Role:    * Wynonia Sours, MD - Primary  PHYSICIAN ASSISTANT:   ASSISTANTS: none   ANESTHESIA:   local and regional  EBL:     BLOOD ADMINISTERED:none  DRAINS: none   LOCAL MEDICATIONS USED:  MARCAINE     SPECIMEN:  Excision  DISPOSITION OF SPECIMEN:  PATHOLOGY  COUNTS:  YES  TOURNIQUET:   Total Tourniquet Time Documented: Forearm (Right) - 23 minutes Total: Forearm (Right) - 23 minutes   DICTATION: .Other Dictation: Dictation Number [id number]  PLAN OF CARE: Discharge to home after PACU  PATIENT DISPOSITION:  PACU - hemodynamically stable.

## 2013-03-19 NOTE — Transfer of Care (Signed)
Immediate Anesthesia Transfer of Care Note  Patient: Elizabeth Olsen  Procedure(s) Performed: Procedure(s): EXCISION TUMOR RIGHT THUMB; DEBRIDEMENT INTERPHALANGEAL RIGHT THUMB (Right)  Patient Location: PACU  Anesthesia Type:Bier block  Level of Consciousness: awake, alert  and oriented  Airway & Oxygen Therapy: Patient Spontanous Breathing and Patient connected to face mask oxygen  Post-op Assessment: Report given to PACU RN and Post -op Vital signs reviewed and stable  Post vital signs: Reviewed and stable  Complications: No apparent anesthesia complications

## 2013-04-08 DIAGNOSIS — E785 Hyperlipidemia, unspecified: Secondary | ICD-10-CM | POA: Diagnosis not present

## 2013-04-08 DIAGNOSIS — I10 Essential (primary) hypertension: Secondary | ICD-10-CM | POA: Diagnosis not present

## 2013-04-12 ENCOUNTER — Emergency Department (HOSPITAL_COMMUNITY)
Admission: EM | Admit: 2013-04-12 | Discharge: 2013-04-12 | Disposition: A | Discharge status: Home / Self Care | Payer: Medicare Other | Attending: Emergency Medicine | Admitting: Emergency Medicine

## 2013-04-12 ENCOUNTER — Encounter (HOSPITAL_COMMUNITY): Payer: Self-pay | Admitting: Family Medicine

## 2013-04-12 ENCOUNTER — Emergency Department (HOSPITAL_COMMUNITY): Payer: Medicare Other

## 2013-04-12 DIAGNOSIS — R011 Cardiac murmur, unspecified: Secondary | ICD-10-CM | POA: Insufficient documentation

## 2013-04-12 DIAGNOSIS — E119 Type 2 diabetes mellitus without complications: Secondary | ICD-10-CM | POA: Diagnosis not present

## 2013-04-12 DIAGNOSIS — E039 Hypothyroidism, unspecified: Secondary | ICD-10-CM | POA: Insufficient documentation

## 2013-04-12 DIAGNOSIS — Z853 Personal history of malignant neoplasm of breast: Secondary | ICD-10-CM | POA: Insufficient documentation

## 2013-04-12 DIAGNOSIS — IMO0002 Reserved for concepts with insufficient information to code with codable children: Secondary | ICD-10-CM | POA: Insufficient documentation

## 2013-04-12 DIAGNOSIS — R109 Unspecified abdominal pain: Secondary | ICD-10-CM | POA: Insufficient documentation

## 2013-04-12 DIAGNOSIS — Z794 Long term (current) use of insulin: Secondary | ICD-10-CM | POA: Diagnosis not present

## 2013-04-12 DIAGNOSIS — Z860100 Personal history of colon polyps, unspecified: Secondary | ICD-10-CM | POA: Insufficient documentation

## 2013-04-12 DIAGNOSIS — M79604 Pain in right leg: Secondary | ICD-10-CM

## 2013-04-12 DIAGNOSIS — M79609 Pain in unspecified limb: Secondary | ICD-10-CM | POA: Insufficient documentation

## 2013-04-12 DIAGNOSIS — Z880 Allergy status to penicillin: Secondary | ICD-10-CM | POA: Insufficient documentation

## 2013-04-12 DIAGNOSIS — Z79899 Other long term (current) drug therapy: Secondary | ICD-10-CM | POA: Insufficient documentation

## 2013-04-12 DIAGNOSIS — I10 Essential (primary) hypertension: Secondary | ICD-10-CM | POA: Insufficient documentation

## 2013-04-12 DIAGNOSIS — M25559 Pain in unspecified hip: Secondary | ICD-10-CM | POA: Diagnosis not present

## 2013-04-12 LAB — POCT I-STAT, CHEM 8
BUN: 18 mg/dL (ref 6–23)
Calcium, Ion: 1.22 mmol/L (ref 1.13–1.30)
Creatinine, Ser: 0.7 mg/dL (ref 0.50–1.10)
Glucose, Bld: 393 mg/dL — ABNORMAL HIGH (ref 70–99)
TCO2: 29 mmol/L (ref 0–100)

## 2013-04-12 LAB — URINALYSIS, ROUTINE W REFLEX MICROSCOPIC
Bilirubin Urine: NEGATIVE
Glucose, UA: >1000 mg/dL — AB
Hgb urine dipstick: NEGATIVE
Ketones, ur: NEGATIVE mg/dL
Leukocytes, UA: NEGATIVE
pH: 7 (ref 5.0–8.0)

## 2013-04-12 LAB — GLUCOSE, CAPILLARY: Glucose-Capillary: 408 mg/dL — ABNORMAL HIGH (ref 70–99)

## 2013-04-12 MED ORDER — PROMETHAZINE HCL 25 MG PO TABS: 25.0000 mg | ORAL_TABLET | Freq: Four times a day (QID) | ORAL | Status: DC | PRN

## 2013-04-12 MED ORDER — ONDANSETRON 4 MG PO TBDP
8.0000 mg | ORAL_TABLET | Freq: Once | ORAL | Status: AC
  Administered 2013-04-12: 8 mg via ORAL
  Filled 2013-04-12: qty 2

## 2013-04-12 MED ORDER — KETOROLAC TROMETHAMINE 60 MG/2ML IM SOLN
60.0000 mg | Freq: Once | INTRAMUSCULAR | Status: AC
  Administered 2013-04-12: 60 mg via INTRAMUSCULAR
  Filled 2013-04-12: qty 2

## 2013-04-12 MED ORDER — HYDROCODONE-ACETAMINOPHEN 5-325 MG PO TABS: 2.0000 | ORAL_TABLET | Freq: Four times a day (QID) | ORAL | Status: DC | PRN

## 2013-04-12 MED ORDER — HYDROCODONE-ACETAMINOPHEN 5-325 MG PO TABS
2.0000 | ORAL_TABLET | Freq: Once | ORAL | Status: AC
  Administered 2013-04-12: 2 via ORAL
  Filled 2013-04-12: qty 2

## 2013-04-12 NOTE — ED Notes (Signed)
Pt reports pain has improved

## 2013-04-12 NOTE — ED Notes (Signed)
Pillow and snack given to pt.

## 2013-04-12 NOTE — ED Notes (Signed)
Per pt sts she woke up with upper right leg pain. Denies injury.

## 2013-04-12 NOTE — ED Notes (Signed)
MD at bedside. 

## 2013-04-12 NOTE — ED Provider Notes (Signed)
History     CSN: [id number]  Arrival date & time 04/12/13  1138   First MD Initiated Contact with Patient 04/12/13 1209      Chief Complaint  Patient presents with  . Leg Pain    (Consider location/radiation/quality/duration/timing/severity/associated sxs/prior treatment) HPI Comments: Patient is a 72 year old female with history of diabetes, hypothyroid, HTN, prior hernia repair in abdomen, who presents today with sudden onset right groin pain. It began when she woke up this morning. She describes it as a severe, sharp pain that radiated down into the anterior aspect of her right leg. It is worse with movement and the worst with hip flexion. She has been in the process of moving and doing quite a bit of extra activity as well as lifting boxes. She otherwise feels well, denies urinary symptoms such as frequency, urgency, dysuria, abdominal pain, nausea, vomiting, numbness, weakness, sob.  She does note she lives with some chronic urinary incontinence.   The history is provided by the patient. No language interpreter was used.    Past Medical History  Diagnosis Date  . Diabetes mellitus   . Thyroid disease   . Blood transfusion   . Breast cancer 15 years ago  . 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 Hypertension   . Heart murmur     as child    Past Surgical History  Procedure Laterality Date  . Mastectomy, modified radical w/reconstruction  15 years ago    left-10 nodes out  . Abdominal hysterectomy    . Iud removal    . Appendectomy    . Tumor excision      lt neck    Family History  Problem Relation Age of Onset  . Colon cancer Neg Hx     History  Substance Use Topics  . Smoking status: Never Smoker   . Smokeless tobacco: Never Used  . Alcohol Use: No    OB History   Grav Para Term Preterm Abortions TAB SAB Ect Mult Living                  Review of Systems  Constitutional: Negative for fever and chills.  Respiratory: Negative for shortness of breath.    Cardiovascular: Negative for chest pain.  Gastrointestinal: Negative for nausea, vomiting and abdominal pain.  Genitourinary: Negative for dysuria, urgency and frequency.  Musculoskeletal: Positive for myalgias, arthralgias and gait problem.  All other systems reviewed and are negative.    Allergies  Erythromycin; Penicillins; Nyquil; and Sulfa antibiotics  Home Medications   Current Outpatient Rx  Name  Route  Sig  Dispense  Refill  . BYDUREON 2 MG SUSR   Injection   Inject 1 each as directed once a week. On Tuesday         . fenofibrate 160 MG tablet   Oral   Take 160 mg by mouth daily.         . fluticasone (FLONASE) 50 MCG/ACT nasal spray   Nasal   Place 2 sprays into the nose daily.   16 g   0   . insulin lispro (HUMALOG) 100 UNIT/ML injection   Subcutaneous   Inject into the skin 3 (three) times daily before meals.         Marland Kitchen LEVEMIR FLEXPEN 100 UNIT/ML injection   Injection   Inject 1 each as directed Daily. 25u am and pm-         . LOVAZA 1 G capsule  Oral   Take 2 capsules by mouth Twice daily.         Marland Kitchen oxyCODONE-acetaminophen (PERCOCET) 7.5-325 MG per tablet   Oral   Take 1 tablet by mouth every 4 (four) hours as needed for pain.   30 tablet   0   . ramipril (ALTACE) 1.25 MG capsule   Oral   Take 1.25 mg by mouth daily.         Marland Kitchen SYNTHROID 88 MCG tablet   Oral   Take 1 tablet by mouth Daily. 1/2 daily         . Vitamin D, Ergocalciferol, (DRISDOL) 50000 UNITS CAPS   Oral   Take 1 capsule by mouth 2 (two) times a week.           BP 173/78  Pulse 94  Temp(Src) 97.7 F (36.5 C) (Oral)  Resp 20  SpO2 97%  Physical Exam  Nursing note and vitals reviewed. Constitutional: She is oriented to person, place, and time. She appears well-developed and well-nourished. No distress.  HENT:  Head: Normocephalic and atraumatic.  Right Ear: External ear normal.  Left Ear: External ear normal.  Nose: Nose normal.  Mouth/Throat:  Oropharynx is clear and moist.  Eyes: Conjunctivae are normal.  Neck: Normal range of motion.  Cardiovascular: Normal rate, regular rhythm, normal heart sounds, intact distal pulses and normal pulses.   Pulses:      Femoral pulses are 2+ on the right side, and 2+ on the left side.      Dorsalis pedis pulses are 2+ on the right side, and 2+ on the left side.       Posterior tibial pulses are 2+ on the right side, and 2+ on the left side.  Pulmonary/Chest: Effort normal and breath sounds normal. No stridor. No respiratory distress. She has no wheezes. She has no rales.  Abdominal: Soft. She exhibits no distension. Hernia confirmed negative in the right inguinal area and confirmed negative in the left inguinal area.  Musculoskeletal: Normal range of motion.  Pain with active flexion of right leg; log roll on right positive; ttp on right groin  Neurological: She is alert and oriented to person, place, and time. She has normal strength.  Skin: Skin is warm and dry. She is not diaphoretic. No erythema.  Psychiatric: She has a normal mood and affect. Her behavior is normal.    ED Course  Procedures (including critical care time)  Labs Reviewed  URINALYSIS, ROUTINE W REFLEX MICROSCOPIC - Abnormal; Notable for the following:    Glucose, UA >1000 (*)    All other components within normal limits  URINE MICROSCOPIC-ADD ON - Abnormal; Notable for the following:    Squamous Epithelial / LPF FEW (*)    All other components within normal limits  GLUCOSE, CAPILLARY - Abnormal; Notable for the following:    Glucose-Capillary 471 (*)    All other components within normal limits  GLUCOSE, CAPILLARY - Abnormal; Notable for the following:    Glucose-Capillary 516 (*)    All other components within normal limits  GLUCOSE, CAPILLARY - Abnormal; Notable for the following:    Glucose-Capillary 408 (*)    All other components within normal limits  GLUCOSE, CAPILLARY - Abnormal; Notable for the following:     Glucose-Capillary 314 (*)    All other components within normal limits  POCT I-STAT, CHEM 8 - Abnormal; Notable for the following:    Sodium 133 (*)    Glucose, Bld 393 (*)  Hemoglobin 10.5 (*)    HCT 31.0 (*)    All other components within normal limits   Dg Hip Complete Right  04/12/2013   *RADIOLOGY REPORT*  Clinical Data: Right hip pain.  No known injury.  RIGHT HIP - COMPLETE 2+ VIEW  Comparison: None.  Findings: Both hips are located.  There is no fracture.  No notable degenerative change is seen about the hips, symphysis pubis or sacroiliac joints.  No focal bony lesion is identified.  Surgical clips in the left lower quadrant of the abdomen are noted.  IMPRESSION: No acute finding.   Original Report Authenticated By: Orlean Patten, M.D.     1. Right leg pain   2. Diabetes       MDM  Patient presents with right leg pain. This is likely msk in nature. XR shows no acute findings. Toradol and norco controlled pain effectively in ED. Will be sent home with rx for pain medication. Urine showed >1000 glucose. CBG done with glucose at 471. Patient took her home dose of insulin. She is compliant with meds, but has poor glucose control. Sees a diabetes clinic. Pushed PO fluids. I-stat shows no gap. CBG was 314 prior to discharge. Follow up with diabetes clinic for better glucose control. Follow up with PCP if leg pain does not improve. Return instructions given. Vital signs stable for discharge. Dr. Wyvonnia Dusky evaluated this patient and agrees with plan. Patient / Family / Caregiver informed of clinical course, understand medical decision-making process, and agree with plan.         Elwyn Lade, PA-C 04/13/13 2127

## 2013-04-12 NOTE — ED Notes (Signed)
PA at bedside.

## 2013-04-12 NOTE — ED Notes (Signed)
PA notified of CBG result.

## 2013-04-14 NOTE — ED Provider Notes (Signed)
Medical screening examination/treatment/procedure(s) were conducted as a shared visit with non-physician practitioner(s) and myself.  I personally evaluated the patient during the encounter  R groin pain after moving some boxes. Pain with hip flexion, external rotation, abduction. 5/5 strength in bilateral lower extremities. Ankle plantar and dorsiflexion intact. Great toe extension intact bilaterally. +2 DP and PT pulses. +2 patellar reflexes bilaterally. Normal gait. UA negative. Hyperglycemia without DKA.  Ezequiel Essex, MD 04/14/13 [phone]

## 2013-04-15 DIAGNOSIS — Z780 Asymptomatic menopausal state: Secondary | ICD-10-CM | POA: Diagnosis not present

## 2013-06-24 DIAGNOSIS — N390 Urinary tract infection, site not specified: Secondary | ICD-10-CM | POA: Diagnosis not present

## 2013-07-02 DIAGNOSIS — L821 Other seborrheic keratosis: Secondary | ICD-10-CM | POA: Diagnosis not present

## 2013-07-07 DIAGNOSIS — K7689 Other specified diseases of liver: Secondary | ICD-10-CM | POA: Diagnosis not present

## 2013-07-07 DIAGNOSIS — D126 Benign neoplasm of colon, unspecified: Secondary | ICD-10-CM | POA: Diagnosis not present

## 2013-07-07 DIAGNOSIS — C50919 Malignant neoplasm of unspecified site of unspecified female breast: Secondary | ICD-10-CM | POA: Diagnosis not present

## 2013-07-07 DIAGNOSIS — E1142 Type 2 diabetes mellitus with diabetic polyneuropathy: Secondary | ICD-10-CM | POA: Diagnosis not present

## 2013-07-07 DIAGNOSIS — E1149 Type 2 diabetes mellitus with other diabetic neurological complication: Secondary | ICD-10-CM | POA: Diagnosis not present

## 2013-07-07 DIAGNOSIS — Z6827 Body mass index (BMI) 27.0-27.9, adult: Secondary | ICD-10-CM | POA: Diagnosis not present

## 2013-08-07 DIAGNOSIS — L821 Other seborrheic keratosis: Secondary | ICD-10-CM | POA: Diagnosis not present

## 2013-08-07 DIAGNOSIS — L570 Actinic keratosis: Secondary | ICD-10-CM | POA: Diagnosis not present

## 2013-08-07 DIAGNOSIS — D239 Other benign neoplasm of skin, unspecified: Secondary | ICD-10-CM | POA: Diagnosis not present

## 2013-08-20 DIAGNOSIS — Z6830 Body mass index (BMI) 30.0-30.9, adult: Secondary | ICD-10-CM | POA: Diagnosis not present

## 2013-08-20 DIAGNOSIS — R809 Proteinuria, unspecified: Secondary | ICD-10-CM | POA: Diagnosis not present

## 2013-08-20 DIAGNOSIS — I10 Essential (primary) hypertension: Secondary | ICD-10-CM | POA: Diagnosis not present

## 2013-08-20 DIAGNOSIS — Z23 Encounter for immunization: Secondary | ICD-10-CM | POA: Diagnosis not present

## 2013-09-05 DIAGNOSIS — J309 Allergic rhinitis, unspecified: Secondary | ICD-10-CM | POA: Diagnosis not present

## 2013-09-05 DIAGNOSIS — R059 Cough, unspecified: Secondary | ICD-10-CM | POA: Diagnosis not present

## 2013-09-05 DIAGNOSIS — R05 Cough: Secondary | ICD-10-CM | POA: Diagnosis not present

## 2013-09-05 DIAGNOSIS — I10 Essential (primary) hypertension: Secondary | ICD-10-CM | POA: Diagnosis not present

## 2013-09-07 DIAGNOSIS — S0990XA Unspecified injury of head, initial encounter: Secondary | ICD-10-CM | POA: Diagnosis not present

## 2013-09-07 DIAGNOSIS — IMO0002 Reserved for concepts with insufficient information to code with codable children: Secondary | ICD-10-CM | POA: Diagnosis not present

## 2013-09-11 DIAGNOSIS — S0003XA Contusion of scalp, initial encounter: Secondary | ICD-10-CM | POA: Diagnosis not present

## 2013-10-02 DIAGNOSIS — IMO0001 Reserved for inherently not codable concepts without codable children: Secondary | ICD-10-CM | POA: Diagnosis not present

## 2013-10-02 DIAGNOSIS — Z0000 Encounter for general adult medical examination without abnormal findings: Secondary | ICD-10-CM | POA: Diagnosis not present

## 2013-11-06 DIAGNOSIS — J069 Acute upper respiratory infection, unspecified: Secondary | ICD-10-CM | POA: Diagnosis not present

## 2013-11-10 DIAGNOSIS — Z1331 Encounter for screening for depression: Secondary | ICD-10-CM | POA: Diagnosis not present

## 2013-11-10 DIAGNOSIS — J209 Acute bronchitis, unspecified: Secondary | ICD-10-CM | POA: Diagnosis not present

## 2013-11-10 DIAGNOSIS — E559 Vitamin D deficiency, unspecified: Secondary | ICD-10-CM | POA: Diagnosis not present

## 2013-11-10 DIAGNOSIS — E11319 Type 2 diabetes mellitus with unspecified diabetic retinopathy without macular edema: Secondary | ICD-10-CM | POA: Diagnosis not present

## 2013-11-10 DIAGNOSIS — E1149 Type 2 diabetes mellitus with other diabetic neurological complication: Secondary | ICD-10-CM | POA: Diagnosis not present

## 2013-11-10 DIAGNOSIS — D126 Benign neoplasm of colon, unspecified: Secondary | ICD-10-CM | POA: Diagnosis not present

## 2013-11-10 DIAGNOSIS — M810 Age-related osteoporosis without current pathological fracture: Secondary | ICD-10-CM | POA: Diagnosis not present

## 2013-11-10 DIAGNOSIS — E039 Hypothyroidism, unspecified: Secondary | ICD-10-CM | POA: Diagnosis not present

## 2013-11-10 DIAGNOSIS — E1142 Type 2 diabetes mellitus with diabetic polyneuropathy: Secondary | ICD-10-CM | POA: Diagnosis not present

## 2013-11-10 DIAGNOSIS — E785 Hyperlipidemia, unspecified: Secondary | ICD-10-CM | POA: Diagnosis not present

## 2013-12-03 DIAGNOSIS — R809 Proteinuria, unspecified: Secondary | ICD-10-CM | POA: Diagnosis not present

## 2013-12-03 DIAGNOSIS — I10 Essential (primary) hypertension: Secondary | ICD-10-CM | POA: Diagnosis not present

## 2013-12-03 DIAGNOSIS — E1149 Type 2 diabetes mellitus with other diabetic neurological complication: Secondary | ICD-10-CM | POA: Diagnosis not present

## 2013-12-03 DIAGNOSIS — E1142 Type 2 diabetes mellitus with diabetic polyneuropathy: Secondary | ICD-10-CM | POA: Diagnosis not present

## 2013-12-03 DIAGNOSIS — Z6831 Body mass index (BMI) 31.0-31.9, adult: Secondary | ICD-10-CM | POA: Diagnosis not present

## 2013-12-04 DIAGNOSIS — H43399 Other vitreous opacities, unspecified eye: Secondary | ICD-10-CM | POA: Diagnosis not present

## 2013-12-04 DIAGNOSIS — H3500 Unspecified background retinopathy: Secondary | ICD-10-CM | POA: Diagnosis not present

## 2013-12-04 DIAGNOSIS — H04129 Dry eye syndrome of unspecified lacrimal gland: Secondary | ICD-10-CM | POA: Diagnosis not present

## 2013-12-04 DIAGNOSIS — H26499 Other secondary cataract, unspecified eye: Secondary | ICD-10-CM | POA: Diagnosis not present

## 2014-02-10 DIAGNOSIS — E11319 Type 2 diabetes mellitus with unspecified diabetic retinopathy without macular edema: Secondary | ICD-10-CM | POA: Diagnosis not present

## 2014-02-10 DIAGNOSIS — E1142 Type 2 diabetes mellitus with diabetic polyneuropathy: Secondary | ICD-10-CM | POA: Diagnosis not present

## 2014-02-10 DIAGNOSIS — E039 Hypothyroidism, unspecified: Secondary | ICD-10-CM | POA: Diagnosis not present

## 2014-02-10 DIAGNOSIS — C50919 Malignant neoplasm of unspecified site of unspecified female breast: Secondary | ICD-10-CM | POA: Diagnosis not present

## 2014-02-10 DIAGNOSIS — E1149 Type 2 diabetes mellitus with other diabetic neurological complication: Secondary | ICD-10-CM | POA: Diagnosis not present

## 2014-02-10 DIAGNOSIS — E559 Vitamin D deficiency, unspecified: Secondary | ICD-10-CM | POA: Diagnosis not present

## 2014-02-10 DIAGNOSIS — E785 Hyperlipidemia, unspecified: Secondary | ICD-10-CM | POA: Diagnosis not present

## 2014-02-10 DIAGNOSIS — I10 Essential (primary) hypertension: Secondary | ICD-10-CM | POA: Diagnosis not present

## 2014-03-29 ENCOUNTER — Emergency Department (HOSPITAL_COMMUNITY)
Admission: EM | Admit: 2014-03-29 | Discharge: 2014-03-29 | Disposition: A | Discharge status: Home / Self Care | Payer: Medicare Other | Attending: Emergency Medicine | Admitting: Emergency Medicine

## 2014-03-29 ENCOUNTER — Encounter (HOSPITAL_COMMUNITY): Payer: Self-pay | Admitting: Emergency Medicine

## 2014-03-29 ENCOUNTER — Emergency Department (HOSPITAL_COMMUNITY): Payer: Medicare Other

## 2014-03-29 DIAGNOSIS — Z860100 Personal history of colon polyps, unspecified: Secondary | ICD-10-CM | POA: Insufficient documentation

## 2014-03-29 DIAGNOSIS — E119 Type 2 diabetes mellitus without complications: Secondary | ICD-10-CM | POA: Diagnosis not present

## 2014-03-29 DIAGNOSIS — Z880 Allergy status to penicillin: Secondary | ICD-10-CM | POA: Diagnosis not present

## 2014-03-29 DIAGNOSIS — E079 Disorder of thyroid, unspecified: Secondary | ICD-10-CM | POA: Insufficient documentation

## 2014-03-29 DIAGNOSIS — D649 Anemia, unspecified: Secondary | ICD-10-CM | POA: Insufficient documentation

## 2014-03-29 DIAGNOSIS — Z853 Personal history of malignant neoplasm of breast: Secondary | ICD-10-CM | POA: Diagnosis not present

## 2014-03-29 DIAGNOSIS — Z79899 Other long term (current) drug therapy: Secondary | ICD-10-CM | POA: Diagnosis not present

## 2014-03-29 DIAGNOSIS — R011 Cardiac murmur, unspecified: Secondary | ICD-10-CM | POA: Diagnosis not present

## 2014-03-29 DIAGNOSIS — R079 Chest pain, unspecified: Secondary | ICD-10-CM

## 2014-03-29 DIAGNOSIS — R739 Hyperglycemia, unspecified: Secondary | ICD-10-CM

## 2014-03-29 DIAGNOSIS — R0789 Other chest pain: Secondary | ICD-10-CM | POA: Insufficient documentation

## 2014-03-29 DIAGNOSIS — F411 Generalized anxiety disorder: Secondary | ICD-10-CM | POA: Diagnosis not present

## 2014-03-29 DIAGNOSIS — I10 Essential (primary) hypertension: Secondary | ICD-10-CM | POA: Diagnosis not present

## 2014-03-29 DIAGNOSIS — Z794 Long term (current) use of insulin: Secondary | ICD-10-CM | POA: Insufficient documentation

## 2014-03-29 LAB — BASIC METABOLIC PANEL
BUN: 19 mg/dL (ref 6–23)
CALCIUM: 9.5 mg/dL (ref 8.4–10.5)
CHLORIDE: 100 meq/L (ref 96–112)
CO2: 27 meq/L (ref 19–32)
Creatinine, Ser: 0.69 mg/dL (ref 0.50–1.10)
GFR calc Af Amer: >90 mL/min (ref >90)
GFR calc non Af Amer: 85 mL/min — ABNORMAL LOW (ref >90)
Glucose, Bld: 239 mg/dL — ABNORMAL HIGH (ref 70–99)
POTASSIUM: 4.1 meq/L (ref 3.7–5.3)
SODIUM: 138 meq/L (ref 137–147)

## 2014-03-29 LAB — CBC
HCT: 29.4 % — ABNORMAL LOW (ref 36.0–46.0)
HEMOGLOBIN: 9.7 g/dL — AB (ref 12.0–15.0)
MCH: 28.9 pg (ref 26.0–34.0)
MCHC: 33 g/dL (ref 30.0–36.0)
MCV: 87.5 fL (ref 78.0–100.0)
PLATELETS: 368 10*3/uL (ref 150–400)
RBC: 3.36 MIL/uL — AB (ref 3.87–5.11)
RDW: 12.8 % (ref 11.5–15.5)
WBC: 7.8 10*3/uL (ref 4.0–10.5)

## 2014-03-29 LAB — TROPONIN I

## 2014-03-29 MED ORDER — LORAZEPAM 0.5 MG PO TABS
0.5000 mg | ORAL_TABLET | Freq: Once | ORAL | Status: AC
  Administered 2014-03-29: 0.5 mg via ORAL
  Filled 2014-03-29: qty 1

## 2014-03-29 NOTE — ED Notes (Signed)
Pt reports chest pain episodes since yesterday, more frequent today, sharp, last for a few seconds then go away. Shob. No diaphoresis, n/v.

## 2014-03-29 NOTE — Discharge Instructions (Signed)
From today's lab tests, your blood is low (hemoglobin 9.7), although very similar to prior test results - follow up with your doctor in the next 1-2 weeks. Also from the lab tests your blood sugar is high (239) - eat diabietic diet, continue your medication, and follow up with your doctor in the next 1-2 weeks. Also follow up with your doctor for recent brief episodes chest discomfort. Return to ER if worse, persistent/recurrent chest pain, trouble breathing, other concern.   You were given medication in the ER that can cause drowsiness - no driving for the next 4 hours.    Chest Pain (Nonspecific) It is often hard to give a specific diagnosis for the cause of chest pain. There is always a chance that your pain could be related to something serious, such as a heart attack or a blood clot in the lungs. You need to follow up with your caregiver for further evaluation. CAUSES   Heartburn.  Pneumonia or bronchitis.  Anxiety or stress.  Inflammation around your heart (pericarditis) or lung (pleuritis or pleurisy).  A blood clot in the lung.  A collapsed lung (pneumothorax). It can develop suddenly on its own (spontaneous pneumothorax) or from injury (trauma) to the chest.  Shingles infection (herpes zoster virus). The chest wall is composed of bones, muscles, and cartilage. Any of these can be the source of the pain.  The bones can be bruised by injury.  The muscles or cartilage can be strained by coughing or overwork.  The cartilage can be affected by inflammation and become sore (costochondritis). DIAGNOSIS  Lab tests or other studies, such as X-rays, electrocardiography, stress testing, or cardiac imaging, may be needed to find the cause of your pain.  TREATMENT   Treatment depends on what may be causing your chest pain. Treatment may include:  Acid blockers for heartburn.  Anti-inflammatory medicine.  Pain medicine for inflammatory conditions.  Antibiotics if an infection  is present.  You may be advised to change lifestyle habits. This includes stopping smoking and avoiding alcohol, caffeine, and chocolate.  You may be advised to keep your head raised (elevated) when sleeping. This reduces the chance of acid going backward from your stomach into your esophagus.  Most of the time, nonspecific chest pain will improve within 2 to 3 days with rest and mild pain medicine. HOME CARE INSTRUCTIONS   If antibiotics were prescribed, take your antibiotics as directed. Finish them even if you start to feel better.  For the next few days, avoid physical activities that bring on chest pain. Continue physical activities as directed.  Do not smoke.  Avoid drinking alcohol.  Only take over-the-counter or prescription medicine for pain, discomfort, or fever as directed by your caregiver.  Follow your caregiver's suggestions for further testing if your chest pain does not go away.  Keep any follow-up appointments you made. If you do not go to an appointment, you could develop lasting (chronic) problems with pain. If there is any problem keeping an appointment, you must call to reschedule. SEEK MEDICAL CARE IF:   You think you are having problems from the medicine you are taking. Read your medicine instructions carefully.  Your chest pain does not go away, even after treatment.  You develop a rash with blisters on your chest. SEEK IMMEDIATE MEDICAL CARE IF:   You have increased chest pain or pain that spreads to your arm, neck, jaw, back, or abdomen.  You develop shortness of breath, an increasing cough, or you are  coughing up blood.  You have severe back or abdominal pain, feel nauseous, or vomit.  You develop severe weakness, fainting, or chills.  You have a fever. THIS IS AN EMERGENCY. Do not wait to see if the pain will go away. Get medical help at once. Call your local emergency services (911 in [place] Do not drive yourself to the hospital. MAKE SURE YOU:     Understand these instructions.  Will watch your condition.  Will get help right away if you are not doing well or get worse. Document Released: 07/26/2005 Document Revised: 01/08/2012 Document Reviewed: 05/21/2008 [facility] Patient Information 2014 [place].   Hyperglycemia Hyperglycemia occurs when the glucose (sugar) in your blood is too high. Hyperglycemia can happen for many reasons, but it most often happens to people who do not know they have diabetes or are not managing their diabetes properly.  CAUSES  Whether you have diabetes or not, there are other causes of hyperglycemia. Hyperglycemia can occur when you have diabetes, but it can also occur in other situations that you might not be as aware of, such as: Diabetes  If you have diabetes and are having problems controlling your blood glucose, hyperglycemia could occur because of some of the following reasons:  Not following your meal plan.  Not taking your diabetes medications or not taking it properly.  Exercising less or doing less activity than you normally do.  Being sick. Pre-diabetes  This cannot be ignored. Before people develop Type 2 diabetes, they almost always have "pre-diabetes." This is when your blood glucose levels are higher than normal, but not yet high enough to be diagnosed as diabetes. Research has shown that some long-term damage to the body, especially the heart and circulatory system, may already be occurring during pre-diabetes. If you take action to manage your blood glucose when you have pre-diabetes, you may delay or prevent Type 2 diabetes from developing. Stress  If you have diabetes, you may be "diet" controlled or on oral medications or insulin to control your diabetes. However, you may find that your blood glucose is higher than usual in the hospital whether you have diabetes or not. This is often referred to as "stress hyperglycemia." Stress can elevate your blood glucose. This happens  because of hormones put out by the body during times of stress. If stress has been the cause of your high blood glucose, it can be followed regularly by your caregiver. That way he/she can make sure your hyperglycemia does not continue to get worse or progress to diabetes. Steroids  Steroids are medications that act on the infection fighting system (immune system) to block inflammation or infection. One side effect can be a rise in blood glucose. Most people can produce enough extra insulin to allow for this rise, but for those who cannot, steroids make blood glucose levels go even higher. It is not unusual for steroid treatments to "uncover" diabetes that is developing. It is not always possible to determine if the hyperglycemia will go away after the steroids are stopped. A special blood test called an A1c is sometimes done to determine if your blood glucose was elevated before the steroids were started. SYMPTOMS  Thirsty.  Frequent urination.  Dry mouth.  Blurred vision.  Tired or fatigue.  Weakness.  Sleepy.  Tingling in feet or leg. DIAGNOSIS  Diagnosis is made by monitoring blood glucose in one or all of the following ways:  A1c test. This is a chemical found in your blood.  Fingerstick  blood glucose monitoring.  Laboratory results. TREATMENT  First, knowing the cause of the hyperglycemia is important before the hyperglycemia can be treated. Treatment may include, but is not be limited to:  Education.  Change or adjustment in medications.  Change or adjustment in meal plan.  Treatment for an illness, infection, etc.  More frequent blood glucose monitoring.  Change in exercise plan.  Decreasing or stopping steroids.  Lifestyle changes. HOME CARE INSTRUCTIONS   Test your blood glucose as directed.  Exercise regularly. Your caregiver will give you instructions about exercise. Pre-diabetes or diabetes which comes on with stress is helped by exercising.  Eat  wholesome, balanced meals. Eat often and at regular, fixed times. Your caregiver or nutritionist will give you a meal plan to guide your sugar intake.  Being at an ideal weight is important. If needed, losing as little as 10 to 15 pounds may help improve blood glucose levels. SEEK MEDICAL CARE IF:   You have questions about medicine, activity, or diet.  You continue to have symptoms (problems such as increased thirst, urination, or weight gain). SEEK IMMEDIATE MEDICAL CARE IF:   You are vomiting or have diarrhea.  Your breath smells fruity.  You are breathing faster or slower.  You are very sleepy or incoherent.  You have numbness, tingling, or pain in your feet or hands.  You have chest pain.  Your symptoms get worse even though you have been following your caregiver's orders.  If you have any other questions or concerns. Document Released: 04/11/2001 Document Revised: 01/08/2012 Document Reviewed: 02/12/2012 [facility] Patient Information 2014 [place], [place].    Anemia, Nonspecific Anemia is a condition in which the concentration of red blood cells or hemoglobin in the blood is below normal. Hemoglobin is a substance in red blood cells that carries oxygen to the tissues of the body. Anemia results in not enough oxygen reaching these tissues.  CAUSES  Common causes of anemia include:   Excessive bleeding. Bleeding may be internal or external. This includes excessive bleeding from periods (in women) or from the intestine.   Poor nutrition.   Chronic kidney, thyroid, and liver disease.  Bone marrow disorders that decrease red blood cell production.  Cancer and treatments for cancer.  HIV, AIDS, and their treatments.  Spleen problems that increase red blood cell destruction.  Blood disorders.  Excess destruction of red blood cells due to infection, medicines, and autoimmune disorders. SIGNS AND SYMPTOMS   Minor weakness.   Dizziness.    Headache.  Palpitations.   Shortness of breath, especially with exercise.   Paleness.  Cold sensitivity.  Indigestion.  Nausea.  Difficulty sleeping.  Difficulty concentrating. Symptoms may occur suddenly or they may develop slowly.  DIAGNOSIS  Additional blood tests are often needed. These help your health care provider determine the best treatment. Your health care provider will check your stool for blood and look for other causes of blood loss.  TREATMENT  Treatment varies depending on the cause of the anemia. Treatment can include:   Supplements of iron, vitamin 123456, or folic acid.   Hormone medicines.   A blood transfusion. This may be needed if blood loss is severe.   Hospitalization. This may be needed if there is significant continual blood loss.   Dietary changes.  Spleen removal. HOME CARE INSTRUCTIONS Keep all follow-up appointments. It often takes many weeks to correct anemia, and having your health care provider check on your condition and your response to treatment is very important. SEEK  IMMEDIATE MEDICAL CARE IF:   You develop extreme weakness, shortness of breath, or chest pain.   You become dizzy or have trouble concentrating.  You develop heavy vaginal bleeding.   You develop a rash.   You have bloody or black, tarry stools.   You faint.   You vomit up blood.   You vomit repeatedly.   You have abdominal pain.  You have a fever or persistent symptoms for more than 2 3 days.   You have a fever and your symptoms suddenly get worse.   You are dehydrated.  MAKE SURE YOU:  Understand these instructions.  Will watch your condition.  Will get help right away if you are not doing well or get worse. Document Released: 11/23/2004 Document Revised: 06/18/2013 Document Reviewed: 04/11/2013 [facility] Patient Information 2014 [place].

## 2014-03-29 NOTE — ED Provider Notes (Signed)
CSN: [id number]     Arrival date & time 03/29/14  1723 History   First MD Initiated Contact with Patient 03/29/14 1804     Chief Complaint  Patient presents with  . Chest Pain     (Consider location/radiation/quality/duration/timing/severity/associated sxs/prior Treatment) Patient is a 73 y.o. female presenting with chest pain. The history is provided by the patient.  Chest Pain Associated symptoms: no abdominal pain, no back pain, no cough, no fever, no headache, no nausea, no palpitations, no shortness of breath and not vomiting   pt c/o episodes chest pain in past day. At rest. Recurrent episodes each lasting approximately 1-2 seconds. Sharp, non radiating. No more prolonged episodes of chest pain or discomfort. No associated nv, diaphoresis, sob, or palpitations. No faintness or dizziness. No unusual doe or fatigue. Denies hx cad or dysrhythmia. Denies chest wall injury or strain. No heartburn or reflux symptoms. No cough or uri c/o. No fever or chills. No change in symptoms whether upright or supine. No fam hx cad.     Past Medical History  Diagnosis Date  . Diabetes mellitus   . Thyroid disease   . Blood transfusion   . Breast cancer 15 years ago  . 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 Hypertension   . Heart murmur     as child   Past Surgical History  Procedure Laterality Date  . Mastectomy, modified radical w/reconstruction  15 years ago    left-10 nodes out  . Abdominal hysterectomy    . Iud removal    . Appendectomy    . Tumor excision      lt neck   Family History  Problem Relation Age of Onset  . Colon cancer Neg Hx    History  Substance Use Topics  . Smoking status: Never Smoker   . Smokeless tobacco: Never Used  . Alcohol Use: No   OB History   Grav Para Term Preterm Abortions TAB SAB Ect Mult Living                 Review of Systems  Constitutional: Negative for fever and chills.  HENT: Negative for sore throat.   Eyes: Negative for redness.   Respiratory: Negative for cough and shortness of breath.   Cardiovascular: Positive for chest pain. Negative for palpitations and leg swelling.  Gastrointestinal: Negative for nausea, vomiting and abdominal pain.  Genitourinary: Negative for flank pain.  Musculoskeletal: Negative for back pain and neck pain.  Skin: Negative for rash.  Neurological: Negative for headaches.  Hematological: Does not bruise/bleed easily.  Psychiatric/Behavioral: Negative for confusion.      Allergies  Erythromycin; Penicillins; Nyquil; and Sulfa antibiotics  Home Medications   Prior to Admission medications   Medication Sig Start Date End Date Taking? Authorizing Provider  BYDUREON 2 MG SUSR Inject 1 each as directed once a week. On Tuesday 03/18/12   Historical Provider, MD  fenofibrate 160 MG tablet Take 160 mg by mouth daily.    Historical Provider, MD  fish oil-omega-3 fatty acids 1000 MG capsule Take 8 g by mouth daily.    Historical Provider, MD  HYDROcodone-acetaminophen (NORCO/VICODIN) 5-325 MG per tablet Take 2 tablets by mouth every 6 (six) hours as needed for pain. 04/12/13   Elwyn Lade, PA-C  insulin detemir (LEVEMIR) 100 UNIT/ML injection Inject 25 Units into the skin 2 (two) times daily.    Historical Provider, MD  insulin lispro (HUMALOG) 100 UNIT/ML injection Inject into the skin 3 (  three) times daily before meals.    Historical Provider, MD  promethazine (PHENERGAN) 25 MG tablet Take 1 tablet (25 mg total) by mouth every 6 (six) hours as needed for nausea. 04/12/13   Elwyn Lade, PA-C  ramipril (ALTACE) 1.25 MG capsule Take 1.25 mg by mouth daily.    Historical Provider, MD  SYNTHROID 88 MCG tablet Take 1 tablet by mouth Daily. 1/2 daily 04/02/12   Historical Provider, MD  Vitamin D, Ergocalciferol, (DRISDOL) 50000 UNITS CAPS Take 50,000 Units by mouth 2 (two) times a week.  02/20/12   Historical Provider, MD   BP 149/69  Pulse 91  Temp(Src) 98.6 F (37 C) (Oral)  Resp 12  SpO2  97% Physical Exam  Nursing note and vitals reviewed. Constitutional: She is oriented to person, place, and time. She appears well-developed and well-nourished. No distress.  HENT:  Mouth/Throat: Oropharynx is clear and moist.  Eyes: Conjunctivae are normal. No scleral icterus.  Neck: Neck supple. No tracheal deviation present. No thyromegaly present.  Cardiovascular: Normal rate, regular rhythm, normal heart sounds and intact distal pulses.  Exam reveals no gallop and no friction rub.   No murmur heard. Pulmonary/Chest: Effort normal and breath sounds normal. No respiratory distress. She exhibits no tenderness.  Abdominal: Soft. Normal appearance and bowel sounds are normal. She exhibits no distension. There is no tenderness.  Musculoskeletal: She exhibits no edema and no tenderness.  Neurological: She is alert and oriented to person, place, and time.  Skin: Skin is warm and dry. No rash noted.  Psychiatric:  anxious    ED Course  Procedures (including critical care time) Labs Review  Results for orders placed during the hospital encounter of 03/29/14  CBC      Result Value Ref Range   WBC 7.8  4.0 - 10.5 K/uL   RBC 3.36 (*) 3.87 - 5.11 MIL/uL   Hemoglobin 9.7 (*) 12.0 - 15.0 g/dL   HCT 29.4 (*) 36.0 - 46.0 %   MCV 87.5  78.0 - 100.0 fL   MCH 28.9  26.0 - 34.0 pg   MCHC 33.0  30.0 - 36.0 g/dL   RDW 12.8  11.5 - 15.5 %   Platelets 368  150 - 400 K/uL  BASIC METABOLIC PANEL      Result Value Ref Range   Sodium 138  137 - 147 mEq/L   Potassium 4.1  3.7 - 5.3 mEq/L   Chloride 100  96 - 112 mEq/L   CO2 27  19 - 32 mEq/L   Glucose, Bld 239 (*) 70 - 99 mg/dL   BUN 19  6 - 23 mg/dL   Creatinine, Ser 0.69  0.50 - 1.10 mg/dL   Calcium 9.5  8.4 - 10.5 mg/dL   GFR calc non Af Amer 85 (*) >90 mL/min   GFR calc Af Amer >90  >90 mL/min  TROPONIN I      Result Value Ref Range   Troponin I <0.30  <0.30 ng/mL   Dg Chest 2 View  03/29/2014   CLINICAL DATA:  Chest pain  EXAM: CHEST  2  VIEW  COMPARISON:  None  FINDINGS: Cardiac silhouette is top-normal in size. Normal mediastinal and hilar contours. Lungs are mildly hyperexpanded but clear. No pleural effusion. No pneumothorax. There multiple surgical vascular clips projecting in the left upper quadrant, left breast and left axilla. The bony thorax is demineralized but intact.  IMPRESSION: No acute cardiopulmonary disease.   Electronically Signed   By: Shanon Brow  Ormond M.D.   On: 03/29/2014 19:06       EKG Interpretation   Date/Time:  Sunday Mar 29 2014 17:33:36 EDT Ventricular Rate:  98 PR Interval:  166 QRS Duration: 100 QT Interval:  363 QTC Calculation: 463 R Axis:   65 Text Interpretation:  Normal sinus rhythm Normal ECG No significant change  since last tracing Confirmed by Ashok Cordia  MD, Lennette Bihari ([id number]) on 03/29/2014  6:11:08 PM      MDM  Labs. Cxr. Ecg.  Reviewed nursing notes and prior charts for additional history.   Pt appears anxious, states spouse v ill recently/chemotherapy pt. States 'theres one', then 'theres another one', each episode lasting literally 1 second, with no change on monitor. Pt visibly moves/jumps w each episode, appearing more anxious.   Ativan .5 mg po.   Recheck pt calm and alert.  nsr on monitor. No pvcs or dysrhythmia w earlier 1 second episodes of pain.   On recheck completely asymptomatic.       Mirna Mires, MD 03/29/14 [phone]

## 2014-03-31 ENCOUNTER — Encounter: Payer: Self-pay | Admitting: Gastroenterology

## 2014-03-31 DIAGNOSIS — E1149 Type 2 diabetes mellitus with other diabetic neurological complication: Secondary | ICD-10-CM | POA: Diagnosis not present

## 2014-03-31 DIAGNOSIS — I10 Essential (primary) hypertension: Secondary | ICD-10-CM | POA: Diagnosis not present

## 2014-03-31 DIAGNOSIS — E1142 Type 2 diabetes mellitus with diabetic polyneuropathy: Secondary | ICD-10-CM | POA: Diagnosis not present

## 2014-04-09 DIAGNOSIS — D509 Iron deficiency anemia, unspecified: Secondary | ICD-10-CM | POA: Diagnosis not present

## 2014-04-09 DIAGNOSIS — K219 Gastro-esophageal reflux disease without esophagitis: Secondary | ICD-10-CM | POA: Diagnosis not present

## 2014-05-01 DIAGNOSIS — R197 Diarrhea, unspecified: Secondary | ICD-10-CM | POA: Diagnosis not present

## 2014-05-27 DIAGNOSIS — E1149 Type 2 diabetes mellitus with other diabetic neurological complication: Secondary | ICD-10-CM | POA: Diagnosis not present

## 2014-05-27 DIAGNOSIS — M810 Age-related osteoporosis without current pathological fracture: Secondary | ICD-10-CM | POA: Diagnosis not present

## 2014-05-27 DIAGNOSIS — K7689 Other specified diseases of liver: Secondary | ICD-10-CM | POA: Diagnosis not present

## 2014-05-27 DIAGNOSIS — E1142 Type 2 diabetes mellitus with diabetic polyneuropathy: Secondary | ICD-10-CM | POA: Diagnosis not present

## 2014-05-27 DIAGNOSIS — E11319 Type 2 diabetes mellitus with unspecified diabetic retinopathy without macular edema: Secondary | ICD-10-CM | POA: Diagnosis not present

## 2014-05-27 DIAGNOSIS — E559 Vitamin D deficiency, unspecified: Secondary | ICD-10-CM | POA: Diagnosis not present

## 2014-05-27 DIAGNOSIS — E039 Hypothyroidism, unspecified: Secondary | ICD-10-CM | POA: Diagnosis not present

## 2014-05-27 DIAGNOSIS — E785 Hyperlipidemia, unspecified: Secondary | ICD-10-CM | POA: Diagnosis not present

## 2014-06-01 ENCOUNTER — Encounter: Payer: Self-pay | Admitting: Gastroenterology

## 2014-06-01 ENCOUNTER — Other Ambulatory Visit (INDEPENDENT_AMBULATORY_CARE_PROVIDER_SITE_OTHER): Payer: Medicare Other

## 2014-06-01 ENCOUNTER — Ambulatory Visit (INDEPENDENT_AMBULATORY_CARE_PROVIDER_SITE_OTHER): Payer: Medicare Other | Admitting: Gastroenterology

## 2014-06-01 VITALS — BP 154/70 | HR 88 | Ht 62.0 in | Wt 163.0 lb

## 2014-06-01 DIAGNOSIS — Z86010 Personal history of colonic polyps: Secondary | ICD-10-CM

## 2014-06-01 DIAGNOSIS — D649 Anemia, unspecified: Secondary | ICD-10-CM | POA: Diagnosis not present

## 2014-06-01 LAB — IBC PANEL
IRON: 64 ug/dL (ref 42–145)
SATURATION RATIOS: 19.6 % — AB (ref 20.0–50.0)
TRANSFERRIN: 233.5 mg/dL (ref 212.0–360.0)

## 2014-06-01 LAB — VITAMIN B12: Vitamin B-12: >1500 pg/mL — ABNORMAL HIGH (ref 211–911)

## 2014-06-01 LAB — IRON: IRON: 64 ug/dL (ref 42–145)

## 2014-06-01 LAB — FOLATE

## 2014-06-01 LAB — FERRITIN: Ferritin: 244.8 ng/mL (ref 10.0–291.0)

## 2014-06-01 NOTE — Patient Instructions (Signed)
Your physician has requested that you go to the basement for lab work before leaving today. CC:  Elizabeth Dus MD

## 2014-06-01 NOTE — Progress Notes (Signed)
History of Present Illness: This is a 73 year old female with fatigue and a normocytic anemia: Hb=9.8, MCV=89.9. Recent stool hemoccults reported were negative. She has a 3 day episode of diarrhea 1 month ago that resolved. She has mild chronic constipation. She underwent colonoscopy in 03/2012 showing small colon polyps. Denies weight loss, abdominal pain, change in stool caliber, melena, hematochezia, nausea, vomiting, dysphagia, reflux symptoms, chest pain.  Allergies  Allergen Reactions  . Erythromycin Swelling    "throat swelling", sob, thrashing ,   . Penicillins Rash  . Nyquil [Pseudoeph-Doxylamine-Dm-Apap] Rash  . Sulfa Antibiotics Rash   Outpatient Prescriptions Prior to Visit  Medication Sig Dispense Refill  . fenofibrate 160 MG tablet Take 160 mg by mouth daily.      . fish oil-omega-3 fatty acids 1000 MG capsule Take 2 g by mouth daily.       . insulin aspart protamine- aspart (NOVOLOG MIX 70/30) (70-30) 100 UNIT/ML injection Inject 10-30 Units into the skin 2 (two) times daily with a meal. 30 units in the am and 10 units in the pm      . levothyroxine (SYNTHROID, LEVOTHROID) 112 MCG tablet Take 112 mcg by mouth daily before breakfast.      . Multiple Vitamins-Minerals (MULTIVITAMIN WITH MINERALS) tablet Take 1 tablet by mouth daily.      . ramipril (ALTACE) 1.25 MG capsule Take 1.25 mg by mouth daily.      . Vitamin D, Ergocalciferol, (DRISDOL) 50000 UNITS CAPS Take 50,000 Units by mouth 2 (two) times a week.        No facility-administered medications prior to visit.   Past Medical History  Diagnosis Date  . Diabetes mellitus   . Hypothyroidism   . Blood transfusion   . Breast cancer 15 years ago  . 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 Hypertension   . Heart murmur     as child  . Hyperlipidemia   . Vitamin D deficiency   . Hernia, incisional   . Anemia    Past Surgical History  Procedure Laterality Date  . Mastectomy, modified radical w/reconstruction Left  15 years ago    10 nodes out  . Abdominal hysterectomy    . Iud removal      with appendectomy  . Appendectomy    . Tumor excision Left     x 2, neck, head   History   Social History  . Marital Status: Married    Spouse Name: N/A    Number of Children: 3  . Years of Education: N/A   Occupational History  . homemaker    Social History Main Topics  . Smoking status: Never Smoker   . Smokeless tobacco: Never Used  . Alcohol Use: No  . Drug Use: No  . Sexual Activity: None   Other Topics Concern  . None   Social History Narrative  . None   Family History  Problem Relation Age of Onset  . Colon cancer Neg Hx   . Diabetes Other     both sides of family  . Hypertension Father   . Hypertension Maternal Grandfather   . Hypertension Maternal Grandmother   . Stomach cancer Maternal Grandmother     GGM      Physical Exam: General: Well developed , well nourished, no acute distress Head: Normocephalic and atraumatic Eyes:  sclerae anicteric, EOMI Ears: Normal auditory acuity Mouth: No deformity or lesions Lungs: Clear throughout to auscultation Heart: Regular rate  and rhythm; no murmurs, rubs or bruits Abdomen: Soft, non tender and non distended. No masses, hepatosplenomegaly or hernias noted. Normal Bowel sounds Musculoskeletal: Symmetrical with no gross deformities  Pulses:  Normal pulses noted Extremities: No clubbing, cyanosis, edema or deformities noted Neurological: Alert oriented x 4, grossly nonfocal Psychological:  Alert and cooperative. Normal mood and affect  Assessment and Recommendations:  1. Normocytic anemia, fatigue. Send iron, TIBC, ferritin, B12 and folate. If she has iron deficiency will proceed with upper endoscopy for further evaluation. Otherwise return to her primary physician for further management of her anemia.  2. Personal history of adenomatous colon polyps. Surveillance colonoscopy recommended June 2018.

## 2014-07-02 DIAGNOSIS — E1149 Type 2 diabetes mellitus with other diabetic neurological complication: Secondary | ICD-10-CM | POA: Diagnosis not present

## 2014-07-02 DIAGNOSIS — I10 Essential (primary) hypertension: Secondary | ICD-10-CM | POA: Diagnosis not present

## 2014-07-02 DIAGNOSIS — Z6828 Body mass index (BMI) 28.0-28.9, adult: Secondary | ICD-10-CM | POA: Diagnosis not present

## 2014-07-07 ENCOUNTER — Other Ambulatory Visit: Payer: Self-pay | Admitting: Obstetrics and Gynecology

## 2014-07-07 DIAGNOSIS — Z1289 Encounter for screening for malignant neoplasm of other sites: Secondary | ICD-10-CM | POA: Diagnosis not present

## 2014-07-07 DIAGNOSIS — Z124 Encounter for screening for malignant neoplasm of cervix: Secondary | ICD-10-CM | POA: Diagnosis not present

## 2014-07-07 DIAGNOSIS — Z01419 Encounter for gynecological examination (general) (routine) without abnormal findings: Secondary | ICD-10-CM | POA: Diagnosis not present

## 2014-07-08 LAB — CYTOLOGY - PAP

## 2014-07-21 ENCOUNTER — Ambulatory Visit (AMBULATORY_SURGERY_CENTER): Payer: Self-pay

## 2014-07-21 VITALS — Ht 62.0 in | Wt 158.8 lb

## 2014-07-21 DIAGNOSIS — D509 Iron deficiency anemia, unspecified: Secondary | ICD-10-CM

## 2014-07-21 NOTE — Progress Notes (Signed)
Pt came into the office today for her pre-visit prior to her endoscopy on 07/28/14 with Dr Fuller Plan. Pt states she has been having some left-side to mid abd pain for a few weeks and was concerned about having the endoscopy done.She also has had burning pain and diarrhea a couple times.No fever, no nausea or vomiting noted. Pt felt like she should see a doctor first before the endoscopy. I spoke with Dr Ventura Bruns office who states they will let the doctor know and call the pt back and schedule an appointment in the office. The endoscopy on 07/28/14 and pre-visit for today were cancelled.The pt understood.

## 2014-07-22 ENCOUNTER — Other Ambulatory Visit: Payer: Self-pay | Admitting: Family Medicine

## 2014-07-22 DIAGNOSIS — Z23 Encounter for immunization: Secondary | ICD-10-CM | POA: Diagnosis not present

## 2014-07-22 DIAGNOSIS — R1032 Left lower quadrant pain: Secondary | ICD-10-CM

## 2014-07-23 ENCOUNTER — Ambulatory Visit
Admission: RE | Admit: 2014-07-23 | Discharge: 2014-07-23 | Disposition: A | Discharge status: Home / Self Care | Payer: Medicare Other | Source: Ambulatory Visit | Attending: Family Medicine | Admitting: Family Medicine

## 2014-07-23 DIAGNOSIS — R1032 Left lower quadrant pain: Secondary | ICD-10-CM | POA: Diagnosis not present

## 2014-07-23 DIAGNOSIS — K7689 Other specified diseases of liver: Secondary | ICD-10-CM | POA: Diagnosis not present

## 2014-07-23 DIAGNOSIS — R933 Abnormal findings on diagnostic imaging of other parts of digestive tract: Secondary | ICD-10-CM | POA: Diagnosis not present

## 2014-07-23 MED ORDER — IOHEXOL 300 MG/ML  SOLN: 100.0000 mL | Freq: Once | INTRAMUSCULAR | Status: AC | PRN

## 2014-07-24 ENCOUNTER — Encounter: Payer: Self-pay | Admitting: Gastroenterology

## 2014-07-27 DIAGNOSIS — D509 Iron deficiency anemia, unspecified: Secondary | ICD-10-CM | POA: Diagnosis not present

## 2014-07-28 ENCOUNTER — Encounter: Payer: Medicare Other | Admitting: Gastroenterology

## 2014-08-07 ENCOUNTER — Ambulatory Visit (AMBULATORY_SURGERY_CENTER): Payer: Self-pay | Admitting: *Deleted

## 2014-08-07 VITALS — Ht 62.0 in | Wt 160.4 lb

## 2014-08-07 DIAGNOSIS — D509 Iron deficiency anemia, unspecified: Secondary | ICD-10-CM

## 2014-08-07 NOTE — Progress Notes (Signed)
No home 02 or c pap. ewm  No egg or soy allergy. ewm  No problems with past sedation. ewm  No bp/stick left side. ewm  No diet pills. ewm  Pt states if Dr Fuller Plan has a cancellation for the morning she would rather have a morning appt.for her egd. Please call her. ewm

## 2014-08-20 DIAGNOSIS — R202 Paresthesia of skin: Secondary | ICD-10-CM | POA: Diagnosis not present

## 2014-08-20 DIAGNOSIS — R079 Chest pain, unspecified: Secondary | ICD-10-CM | POA: Diagnosis not present

## 2014-08-21 ENCOUNTER — Ambulatory Visit (AMBULATORY_SURGERY_CENTER): Payer: Medicare Other | Admitting: Gastroenterology

## 2014-08-21 ENCOUNTER — Encounter: Payer: Self-pay | Admitting: Gastroenterology

## 2014-08-21 ENCOUNTER — Telehealth: Payer: Self-pay | Admitting: *Deleted

## 2014-08-21 VITALS — BP 142/71 | HR 87 | Temp 96.1°F | Resp 21 | Ht 62.0 in | Wt 160.0 lb

## 2014-08-21 DIAGNOSIS — D509 Iron deficiency anemia, unspecified: Secondary | ICD-10-CM | POA: Diagnosis not present

## 2014-08-21 DIAGNOSIS — I10 Essential (primary) hypertension: Secondary | ICD-10-CM | POA: Diagnosis not present

## 2014-08-21 DIAGNOSIS — K2940 Chronic atrophic gastritis without bleeding: Secondary | ICD-10-CM

## 2014-08-21 DIAGNOSIS — K2980 Duodenitis without bleeding: Secondary | ICD-10-CM

## 2014-08-21 DIAGNOSIS — E669 Obesity, unspecified: Secondary | ICD-10-CM | POA: Diagnosis not present

## 2014-08-21 DIAGNOSIS — E119 Type 2 diabetes mellitus without complications: Secondary | ICD-10-CM | POA: Diagnosis not present

## 2014-08-21 LAB — GLUCOSE, CAPILLARY
GLUCOSE-CAPILLARY: 356 mg/dL — AB (ref 70–99)
GLUCOSE-CAPILLARY: 256 mg/dL — AB (ref 70–99)
Glucose-Capillary: 340 mg/dL — ABNORMAL HIGH (ref 70–99)

## 2014-08-21 MED ORDER — INSULIN REGULAR HUMAN 100 UNIT/ML IJ SOLN
5.0000 [IU] | Freq: Once | INTRAMUSCULAR | Status: AC
  Administered 2014-08-21: 5 [IU] via SUBCUTANEOUS

## 2014-08-21 MED ORDER — SODIUM CHLORIDE 0.9 % IV SOLN: 500.0000 mL | INTRAVENOUS | Status: DC

## 2014-08-21 NOTE — Patient Instructions (Signed)

## 2014-08-21 NOTE — Progress Notes (Signed)
Report to PACU, RN, vss, BBS= Clear.  

## 2014-08-21 NOTE — Telephone Encounter (Signed)
Patient scheduled for 330 EGD. Calling in as her blood sugar is 357 this. She normally takes 10 units at night and 30 units in the morning. She did not take her insulin last night as she states she was instructed not to. Informed Dr. Fuller Plan and he advised for her to take 15 units Novolog 70/30 now and move her appointment from 330 pm to 1130 am if she has transportation. The patient does have transportation and will be here at 1030 am for an 1130 am appointment. She is aware of what dose of insulin to take, recheck her blood sugar before she leaves the house and nothing by mouth from now until after her procedure.

## 2014-08-21 NOTE — Op Note (Signed)
[facility]  [address] [place], [address]   ENDOSCOPY PROCEDURE REPORT  PATIENT: Elizabeth Olsen, Elizabeth Olsen  MR#: [id number] BIRTHDATE: 12-Jun-1941 , 73  yrs. old GENDER: female ENDOSCOPIST: Ladene Artist, MD, [facility] REFERRED BY:  [name], M.D. PROCEDURE DATE:  08/21/2014 PROCEDURE:  EGD w/ biopsy ASA CLASS:     Class III INDICATIONS:  iron deficiency anemia. MEDICATIONS: Monitored anesthesia care and Propofol 180 mg IV TOPICAL ANESTHETIC: none DESCRIPTION OF PROCEDURE: After the risks benefits and alternatives of the procedure were thoroughly explained, informed consent was obtained.  The LB [id number] endoscope was introduced through the mouth and advanced to the second portion of the duodenum , Without limitations.  The instrument was slowly withdrawn as the mucosa was fully examined.    STOMACH: Atrophic abnormal mucosa was found in the gastric fundus and gastric body.  The mucosa was atrophic.  Multiple biopsies were performed. DUODENUM: The duodenal mucosa showed no abnormalities in the bulb and 2nd part of the duodenum.  Multiple biopsies were performed. ESOPHAGUS: The mucosa of the esophagus appeared normal.  Retroflexed views revealed no abnormalities.     The scope was then withdrawn from the patient and the procedure completed.  COMPLICATIONS: There were no immediate complications.  ENDOSCOPIC IMPRESSION: 1.   Atrophic appearing gastric mucosa; multiple biopsies performed 2.   Duodenal mucosa appeared normal; multiple biopsies performed  RECOMMENDATIONS: 1.  Await pathology results 2.  Fe bid with meals for 2 months 3.  Follow up with PCP in 2 months  eSigned:  Ladene Artist, MD, [facility] 08/21/2014 11:23 AM

## 2014-08-21 NOTE — Progress Notes (Signed)
Blood sugar rechecked by Randye Lobo @ [id number] was 340. Dr Fuller Plan informed.

## 2014-08-21 NOTE — Progress Notes (Signed)
Called to room to assist during endoscopic procedure.  Patient ID and intended procedure confirmed with present staff. Received instructions for my participation in the procedure from the performing physician.  

## 2014-08-24 ENCOUNTER — Telehealth: Payer: Self-pay | Admitting: *Deleted

## 2014-08-24 NOTE — Telephone Encounter (Signed)
  Follow up Call-  Call back number 08/21/2014 04/26/2012  Post procedure Call Back phone  # [phone]  Permission to leave phone message Yes Yes     Patient questions:  Do you have a fever, pain , or abdominal swelling? No. Pain Score  0 *  Have you tolerated food without any problems? Yes.    Have you been able to return to your normal activities? Yes.    Do you have any questions about your discharge instructions: Diet   No. Medications  No. Follow up visit  No.  Do you have questions or concerns about your Care? No.  Actions: * If pain score is 4 or above: No action needed, pain <4.

## 2014-08-25 ENCOUNTER — Encounter: Payer: Self-pay | Admitting: Gastroenterology

## 2014-09-02 ENCOUNTER — Telehealth: Payer: Self-pay | Admitting: Gastroenterology

## 2014-09-02 NOTE — Telephone Encounter (Signed)
No treatment needed at this time.

## 2014-09-02 NOTE — Telephone Encounter (Signed)
Patient notified

## 2014-09-02 NOTE — Telephone Encounter (Signed)
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  She is not on a PPI or other GI meds.  Please advise if she needs anything?

## 2014-09-21 DIAGNOSIS — H26493 Other secondary cataract, bilateral: Secondary | ICD-10-CM | POA: Diagnosis not present

## 2014-09-29 DIAGNOSIS — E039 Hypothyroidism, unspecified: Secondary | ICD-10-CM | POA: Diagnosis not present

## 2014-09-29 DIAGNOSIS — K769 Liver disease, unspecified: Secondary | ICD-10-CM | POA: Diagnosis not present

## 2014-09-29 DIAGNOSIS — E11319 Type 2 diabetes mellitus with unspecified diabetic retinopathy without macular edema: Secondary | ICD-10-CM | POA: Diagnosis not present

## 2014-09-29 DIAGNOSIS — E785 Hyperlipidemia, unspecified: Secondary | ICD-10-CM | POA: Diagnosis not present

## 2014-09-29 DIAGNOSIS — E559 Vitamin D deficiency, unspecified: Secondary | ICD-10-CM | POA: Diagnosis not present

## 2014-09-29 DIAGNOSIS — I10 Essential (primary) hypertension: Secondary | ICD-10-CM | POA: Diagnosis not present

## 2014-09-29 DIAGNOSIS — E1142 Type 2 diabetes mellitus with diabetic polyneuropathy: Secondary | ICD-10-CM | POA: Diagnosis not present

## 2014-09-29 DIAGNOSIS — C50919 Malignant neoplasm of unspecified site of unspecified female breast: Secondary | ICD-10-CM | POA: Diagnosis not present

## 2014-11-02 DIAGNOSIS — I10 Essential (primary) hypertension: Secondary | ICD-10-CM | POA: Diagnosis not present

## 2014-11-02 DIAGNOSIS — E1140 Type 2 diabetes mellitus with diabetic neuropathy, unspecified: Secondary | ICD-10-CM | POA: Diagnosis not present

## 2014-11-02 DIAGNOSIS — F4320 Adjustment disorder, unspecified: Secondary | ICD-10-CM | POA: Diagnosis not present

## 2014-11-02 DIAGNOSIS — E1142 Type 2 diabetes mellitus with diabetic polyneuropathy: Secondary | ICD-10-CM | POA: Diagnosis not present

## 2014-11-02 DIAGNOSIS — Z6827 Body mass index (BMI) 27.0-27.9, adult: Secondary | ICD-10-CM | POA: Diagnosis not present

## 2014-11-12 DIAGNOSIS — Z6827 Body mass index (BMI) 27.0-27.9, adult: Secondary | ICD-10-CM | POA: Diagnosis not present

## 2014-11-12 DIAGNOSIS — E1140 Type 2 diabetes mellitus with diabetic neuropathy, unspecified: Secondary | ICD-10-CM | POA: Diagnosis not present

## 2014-11-12 DIAGNOSIS — F4320 Adjustment disorder, unspecified: Secondary | ICD-10-CM | POA: Diagnosis not present

## 2014-11-12 DIAGNOSIS — I10 Essential (primary) hypertension: Secondary | ICD-10-CM | POA: Diagnosis not present

## 2014-12-20 DIAGNOSIS — J029 Acute pharyngitis, unspecified: Secondary | ICD-10-CM | POA: Diagnosis not present

## 2014-12-20 DIAGNOSIS — I10 Essential (primary) hypertension: Secondary | ICD-10-CM | POA: Diagnosis not present

## 2014-12-20 DIAGNOSIS — R05 Cough: Secondary | ICD-10-CM | POA: Diagnosis not present

## 2014-12-20 DIAGNOSIS — J069 Acute upper respiratory infection, unspecified: Secondary | ICD-10-CM | POA: Diagnosis not present

## 2014-12-20 DIAGNOSIS — R070 Pain in throat: Secondary | ICD-10-CM | POA: Diagnosis not present

## 2014-12-23 DIAGNOSIS — J0100 Acute maxillary sinusitis, unspecified: Secondary | ICD-10-CM | POA: Diagnosis not present

## 2014-12-23 DIAGNOSIS — I10 Essential (primary) hypertension: Secondary | ICD-10-CM | POA: Diagnosis not present

## 2015-01-15 DIAGNOSIS — R0602 Shortness of breath: Secondary | ICD-10-CM | POA: Diagnosis not present

## 2015-01-15 DIAGNOSIS — R609 Edema, unspecified: Secondary | ICD-10-CM | POA: Diagnosis not present

## 2015-01-15 DIAGNOSIS — D649 Anemia, unspecified: Secondary | ICD-10-CM | POA: Diagnosis not present

## 2015-01-15 DIAGNOSIS — D539 Nutritional anemia, unspecified: Secondary | ICD-10-CM | POA: Diagnosis not present

## 2015-01-15 DIAGNOSIS — E11649 Type 2 diabetes mellitus with hypoglycemia without coma: Secondary | ICD-10-CM | POA: Diagnosis not present

## 2015-03-02 ENCOUNTER — Telehealth: Payer: Self-pay | Admitting: *Deleted

## 2015-03-02 DIAGNOSIS — J069 Acute upper respiratory infection, unspecified: Secondary | ICD-10-CM | POA: Diagnosis not present

## 2015-03-02 NOTE — Telephone Encounter (Addendum)
Voicemail received from patient requesting forms from [employer] be completed by Dr. Benay Spice.  Called patient and learned she received a letter that the water where her family was stationed in [place], [place]. Was contaminated.  The family has all been diagnosed with cancer or have died.  Currently on medication that cost $1400.00/mos she can get from [place] for $27.  Instructed to have forms completed by the doctor who treated her for cancer.  This will help her obtain medications at a lower cost.  Will notify Dr. Benay Spice of this request.  Patient's last visit was 04-27-2008 under [facility].     Will bring form tomorrow.  Explained forma take 7 to 10 business days.  Return number [phone].

## 2015-03-23 DIAGNOSIS — E109 Type 1 diabetes mellitus without complications: Secondary | ICD-10-CM | POA: Diagnosis not present

## 2015-03-23 DIAGNOSIS — E039 Hypothyroidism, unspecified: Secondary | ICD-10-CM | POA: Diagnosis not present

## 2015-03-23 DIAGNOSIS — Z1389 Encounter for screening for other disorder: Secondary | ICD-10-CM | POA: Diagnosis not present

## 2015-03-23 DIAGNOSIS — N951 Menopausal and female climacteric states: Secondary | ICD-10-CM | POA: Diagnosis not present

## 2015-03-23 DIAGNOSIS — E782 Mixed hyperlipidemia: Secondary | ICD-10-CM | POA: Diagnosis not present

## 2015-03-23 DIAGNOSIS — Z23 Encounter for immunization: Secondary | ICD-10-CM | POA: Diagnosis not present

## 2015-03-23 DIAGNOSIS — Z0000 Encounter for general adult medical examination without abnormal findings: Secondary | ICD-10-CM | POA: Diagnosis not present

## 2015-03-23 DIAGNOSIS — I10 Essential (primary) hypertension: Secondary | ICD-10-CM | POA: Diagnosis not present

## 2015-03-31 ENCOUNTER — Other Ambulatory Visit: Payer: Self-pay | Admitting: Obstetrics and Gynecology

## 2015-03-31 DIAGNOSIS — N644 Mastodynia: Secondary | ICD-10-CM

## 2015-04-02 ENCOUNTER — Other Ambulatory Visit: Payer: Self-pay | Admitting: Obstetrics and Gynecology

## 2015-04-02 DIAGNOSIS — Z9012 Acquired absence of left breast and nipple: Secondary | ICD-10-CM

## 2015-04-02 DIAGNOSIS — N644 Mastodynia: Secondary | ICD-10-CM

## 2015-04-02 DIAGNOSIS — Z780 Asymptomatic menopausal state: Secondary | ICD-10-CM | POA: Diagnosis not present

## 2015-04-05 ENCOUNTER — Ambulatory Visit
Admission: RE | Admit: 2015-04-05 | Discharge: 2015-04-05 | Disposition: A | Discharge status: Home / Self Care | Payer: Medicare Other | Source: Ambulatory Visit | Attending: Obstetrics and Gynecology | Admitting: Obstetrics and Gynecology

## 2015-04-05 DIAGNOSIS — N644 Mastodynia: Secondary | ICD-10-CM

## 2015-04-05 DIAGNOSIS — Z9012 Acquired absence of left breast and nipple: Secondary | ICD-10-CM

## 2015-04-05 DIAGNOSIS — N6459 Other signs and symptoms in breast: Secondary | ICD-10-CM | POA: Diagnosis not present

## 2015-04-14 ENCOUNTER — Telehealth: Payer: Self-pay | Admitting: *Deleted

## 2015-04-14 NOTE — Telephone Encounter (Signed)
Patient requesting records.  Called and informed patient that it will be 1-2 weeks before we will receive those records.  Per Elby Showers. Marcello Moores, NP.  Patient verbalized understanding.

## 2015-05-05 DIAGNOSIS — E039 Hypothyroidism, unspecified: Secondary | ICD-10-CM | POA: Diagnosis not present

## 2015-06-08 ENCOUNTER — Telehealth: Payer: Self-pay | Admitting: *Deleted

## 2015-06-08 NOTE — Telephone Encounter (Signed)
Patient called stating that she is waiting for medical records dating back to 2009 when she received chemotherapy. Medical records notified and will try to retreive records for patient. Medical records will call patient. Patient informed and verbalized understanding.

## 2015-06-10 ENCOUNTER — Telehealth: Payer: Self-pay | Admitting: *Deleted

## 2015-06-10 ENCOUNTER — Ambulatory Visit (INDEPENDENT_AMBULATORY_CARE_PROVIDER_SITE_OTHER): Payer: Medicare Other | Admitting: Podiatry

## 2015-06-10 ENCOUNTER — Encounter: Payer: Self-pay | Admitting: Podiatry

## 2015-06-10 ENCOUNTER — Ambulatory Visit: Payer: Medicare Other

## 2015-06-10 VITALS — BP 168/89 | HR 69 | Resp 12

## 2015-06-10 DIAGNOSIS — R52 Pain, unspecified: Secondary | ICD-10-CM

## 2015-06-10 DIAGNOSIS — B351 Tinea unguium: Secondary | ICD-10-CM | POA: Diagnosis not present

## 2015-06-10 DIAGNOSIS — L603 Nail dystrophy: Secondary | ICD-10-CM

## 2015-06-10 DIAGNOSIS — L601 Onycholysis: Secondary | ICD-10-CM | POA: Diagnosis not present

## 2015-06-10 DIAGNOSIS — E1142 Type 2 diabetes mellitus with diabetic polyneuropathy: Secondary | ICD-10-CM | POA: Diagnosis not present

## 2015-06-10 NOTE — Progress Notes (Signed)
   Subjective:    Patient ID: Elizabeth Olsen Feeling, female    DOB: 22-Mar-1941, 74 y.o.   MRN: [id number]  HPI She states that she has had discolored painful hallux nails for many years. States they have been loose and elevated for several years. States they're becoming more painful as time goes on and elected consider her options.   Review of Systems  Skin: Positive for color change.       Objective:   Physical Exam: I have reviewed her past medical history medications allergies surgery social history review of systems. 74 year old female in apparent good nutritional status. Pulses are strongly palpable bilateral. Neurologic sensorium is intact per Semmes-Weinstein monofilament. Deep tendon reflexes are intact bilateral and muscle strength is 5 over 5 dorsiflexion plantar flexors and inverters everters onto the musculature is intact. Orthopedic evaluation there is trace all joints distal to the ankle range of motion without crepitation. Cutaneous evaluation demonstrates supple well-hydrated cutis there is no erythema edema saline as drainage or odor with exception of the hallux nail plates bilaterally which demonstrate distal onychomycosis with subungual debris.        Assessment & Plan:  Assessment: 74 year old female with hallux nails which appear to be dystrophic or possibly mycotic.  Plan: Discussed etiology pathology conservative versus surgical therapies. We took a sample of the toenail and skin today for pathologic evaluation. Once this returns we will consider all therapy or laser therapy. I will follow-up with her with results.

## 2015-06-11 NOTE — Telephone Encounter (Signed)
Entered in error

## 2015-06-25 ENCOUNTER — Telehealth: Payer: Self-pay | Admitting: *Deleted

## 2015-06-25 NOTE — Telephone Encounter (Signed)
Pt called for toenail culture result.  I left message informing pt the test took 4-6 weeks and would call back with results when available.

## 2015-06-30 ENCOUNTER — Telehealth: Payer: Self-pay | Admitting: *Deleted

## 2015-06-30 MED ORDER — FLUCONAZOLE 150 MG PO TABS: ORAL_TABLET | ORAL | Status: DC

## 2015-06-30 NOTE — Telephone Encounter (Addendum)
Dr. Milinda Pointer reviewed pt's toenail culture of 06/10/2015 a + yeast and to treat with Diflucan 150mg  #56 1 tablet every day for 14 days then off 14 days as 1 cycle, and repeat for 3 cycles. I informed pt of the results and the medication and directions, and ordered .

## 2015-07-28 DIAGNOSIS — B351 Tinea unguium: Secondary | ICD-10-CM | POA: Diagnosis not present

## 2015-07-28 DIAGNOSIS — E039 Hypothyroidism, unspecified: Secondary | ICD-10-CM | POA: Diagnosis not present

## 2015-07-28 DIAGNOSIS — E1140 Type 2 diabetes mellitus with diabetic neuropathy, unspecified: Secondary | ICD-10-CM | POA: Diagnosis not present

## 2015-07-28 DIAGNOSIS — E785 Hyperlipidemia, unspecified: Secondary | ICD-10-CM | POA: Diagnosis not present

## 2015-07-28 DIAGNOSIS — E559 Vitamin D deficiency, unspecified: Secondary | ICD-10-CM | POA: Diagnosis not present

## 2015-07-28 DIAGNOSIS — Z6828 Body mass index (BMI) 28.0-28.9, adult: Secondary | ICD-10-CM | POA: Diagnosis not present

## 2015-07-28 DIAGNOSIS — K769 Liver disease, unspecified: Secondary | ICD-10-CM | POA: Diagnosis not present

## 2015-07-28 DIAGNOSIS — K635 Polyp of colon: Secondary | ICD-10-CM | POA: Diagnosis not present

## 2015-07-28 DIAGNOSIS — C50919 Malignant neoplasm of unspecified site of unspecified female breast: Secondary | ICD-10-CM | POA: Diagnosis not present

## 2015-07-28 DIAGNOSIS — Z23 Encounter for immunization: Secondary | ICD-10-CM | POA: Diagnosis not present

## 2015-07-28 DIAGNOSIS — E1142 Type 2 diabetes mellitus with diabetic polyneuropathy: Secondary | ICD-10-CM | POA: Diagnosis not present

## 2015-08-17 DIAGNOSIS — I10 Essential (primary) hypertension: Secondary | ICD-10-CM | POA: Diagnosis not present

## 2015-08-17 DIAGNOSIS — Z6827 Body mass index (BMI) 27.0-27.9, adult: Secondary | ICD-10-CM | POA: Diagnosis not present

## 2015-08-17 DIAGNOSIS — E1140 Type 2 diabetes mellitus with diabetic neuropathy, unspecified: Secondary | ICD-10-CM | POA: Diagnosis not present

## 2015-09-05 DIAGNOSIS — H6503 Acute serous otitis media, bilateral: Secondary | ICD-10-CM | POA: Diagnosis not present

## 2015-09-06 ENCOUNTER — Encounter: Payer: Self-pay | Admitting: Podiatry

## 2015-09-27 DIAGNOSIS — I10 Essential (primary) hypertension: Secondary | ICD-10-CM | POA: Diagnosis not present

## 2015-09-27 DIAGNOSIS — E782 Mixed hyperlipidemia: Secondary | ICD-10-CM | POA: Diagnosis not present

## 2015-09-28 DIAGNOSIS — Z6828 Body mass index (BMI) 28.0-28.9, adult: Secondary | ICD-10-CM | POA: Diagnosis not present

## 2015-09-28 DIAGNOSIS — I10 Essential (primary) hypertension: Secondary | ICD-10-CM | POA: Diagnosis not present

## 2015-09-28 DIAGNOSIS — E1165 Type 2 diabetes mellitus with hyperglycemia: Secondary | ICD-10-CM | POA: Diagnosis not present

## 2015-09-28 DIAGNOSIS — E1140 Type 2 diabetes mellitus with diabetic neuropathy, unspecified: Secondary | ICD-10-CM | POA: Diagnosis not present

## 2015-10-05 ENCOUNTER — Telehealth: Payer: Self-pay | Admitting: *Deleted

## 2015-10-05 NOTE — Telephone Encounter (Signed)
Pt dropped off [place] forms while in the office with her husband about 2 weeks ago. Needs additional information- chemotherapy treatment dates which pre-date our electronic medical record.  Confirmed treatment dates were 06/1997 through 11/1997. Pt received Adriamycin followed by Taxol then Cytoxan (sequentially). Left message on voicemail informing pt that form is complete. Awaiting documentation from HIM dept to submit with form.

## 2015-10-08 ENCOUNTER — Encounter: Payer: Self-pay | Admitting: Medical Oncology

## 2015-10-08 NOTE — Progress Notes (Signed)
Request for med records from [facility] given to [place] in med records.

## 2015-10-13 ENCOUNTER — Telehealth: Payer: Self-pay | Admitting: *Deleted

## 2015-10-13 NOTE — Telephone Encounter (Signed)
Pt came in to office to pick up Sydnee [facility] forms and medical records. Spoke with Tiffany in HIM dept, she will get forms to pt.

## 2015-10-15 ENCOUNTER — Encounter: Payer: Self-pay | Admitting: Gastroenterology

## 2015-11-03 ENCOUNTER — Telehealth: Payer: Self-pay | Admitting: *Deleted

## 2015-11-03 NOTE — Telephone Encounter (Addendum)
"  I'm a patient of Der. [facility].  I was under a clinical trial and was told the medication would raise my blood sugar.  I now have diabetes and never had a problem until taking this medication.  I pay $400.00 for one insulin, $300.00 for another and $90.00 for needles.  If a letter reads the second chemotherapy I received had this risk of raising blood sugar, the [employer] will pay for all my blood sugar medicines.  I need this letter ASAP.  I can come by next Wednesday.  Return number [phone].  [place] [id number] per  [facility].

## 2015-11-04 NOTE — Telephone Encounter (Signed)
Per Dr. Benay Spice: Chemo did not cause her diabetes. Left message on voicemail for pt to call office.

## 2015-11-08 DIAGNOSIS — I10 Essential (primary) hypertension: Secondary | ICD-10-CM | POA: Diagnosis not present

## 2015-11-17 DIAGNOSIS — E113312 Type 2 diabetes mellitus with moderate nonproliferative diabetic retinopathy with macular edema, left eye: Secondary | ICD-10-CM | POA: Diagnosis not present

## 2015-11-17 DIAGNOSIS — E113311 Type 2 diabetes mellitus with moderate nonproliferative diabetic retinopathy with macular edema, right eye: Secondary | ICD-10-CM | POA: Diagnosis not present

## 2015-11-17 DIAGNOSIS — H5213 Myopia, bilateral: Secondary | ICD-10-CM | POA: Diagnosis not present

## 2015-11-23 DIAGNOSIS — E784 Other hyperlipidemia: Secondary | ICD-10-CM | POA: Diagnosis not present

## 2015-11-23 DIAGNOSIS — E1142 Type 2 diabetes mellitus with diabetic polyneuropathy: Secondary | ICD-10-CM | POA: Diagnosis not present

## 2015-11-23 DIAGNOSIS — K769 Liver disease, unspecified: Secondary | ICD-10-CM | POA: Diagnosis not present

## 2015-11-23 DIAGNOSIS — Z6828 Body mass index (BMI) 28.0-28.9, adult: Secondary | ICD-10-CM | POA: Diagnosis not present

## 2015-11-23 DIAGNOSIS — I10 Essential (primary) hypertension: Secondary | ICD-10-CM | POA: Diagnosis not present

## 2015-11-23 DIAGNOSIS — K635 Polyp of colon: Secondary | ICD-10-CM | POA: Diagnosis not present

## 2015-11-23 DIAGNOSIS — E038 Other specified hypothyroidism: Secondary | ICD-10-CM | POA: Diagnosis not present

## 2015-11-23 DIAGNOSIS — E559 Vitamin D deficiency, unspecified: Secondary | ICD-10-CM | POA: Diagnosis not present

## 2015-11-23 DIAGNOSIS — E1140 Type 2 diabetes mellitus with diabetic neuropathy, unspecified: Secondary | ICD-10-CM | POA: Diagnosis not present

## 2015-12-03 DIAGNOSIS — J101 Influenza due to other identified influenza virus with other respiratory manifestations: Secondary | ICD-10-CM | POA: Diagnosis not present

## 2015-12-08 DIAGNOSIS — J029 Acute pharyngitis, unspecified: Secondary | ICD-10-CM | POA: Diagnosis not present

## 2015-12-27 DIAGNOSIS — N3941 Urge incontinence: Secondary | ICD-10-CM | POA: Diagnosis not present

## 2015-12-27 DIAGNOSIS — Z0000 Encounter for general adult medical examination without abnormal findings: Secondary | ICD-10-CM | POA: Diagnosis not present

## 2015-12-29 DIAGNOSIS — E1140 Type 2 diabetes mellitus with diabetic neuropathy, unspecified: Secondary | ICD-10-CM | POA: Diagnosis not present

## 2015-12-29 DIAGNOSIS — Z6828 Body mass index (BMI) 28.0-28.9, adult: Secondary | ICD-10-CM | POA: Diagnosis not present

## 2015-12-29 DIAGNOSIS — I10 Essential (primary) hypertension: Secondary | ICD-10-CM | POA: Diagnosis not present

## 2016-02-24 DIAGNOSIS — E113312 Type 2 diabetes mellitus with moderate nonproliferative diabetic retinopathy with macular edema, left eye: Secondary | ICD-10-CM | POA: Diagnosis not present

## 2016-02-24 DIAGNOSIS — H40003 Preglaucoma, unspecified, bilateral: Secondary | ICD-10-CM | POA: Diagnosis not present

## 2016-02-24 DIAGNOSIS — E113311 Type 2 diabetes mellitus with moderate nonproliferative diabetic retinopathy with macular edema, right eye: Secondary | ICD-10-CM | POA: Diagnosis not present

## 2016-03-03 ENCOUNTER — Other Ambulatory Visit: Payer: Self-pay

## 2016-03-03 DIAGNOSIS — Z1231 Encounter for screening mammogram for malignant neoplasm of breast: Secondary | ICD-10-CM

## 2016-03-15 DIAGNOSIS — Z6828 Body mass index (BMI) 28.0-28.9, adult: Secondary | ICD-10-CM | POA: Diagnosis not present

## 2016-03-15 DIAGNOSIS — I10 Essential (primary) hypertension: Secondary | ICD-10-CM | POA: Diagnosis not present

## 2016-03-15 DIAGNOSIS — D6489 Other specified anemias: Secondary | ICD-10-CM | POA: Diagnosis not present

## 2016-03-15 DIAGNOSIS — E038 Other specified hypothyroidism: Secondary | ICD-10-CM | POA: Diagnosis not present

## 2016-03-15 DIAGNOSIS — E559 Vitamin D deficiency, unspecified: Secondary | ICD-10-CM | POA: Diagnosis not present

## 2016-03-15 DIAGNOSIS — K635 Polyp of colon: Secondary | ICD-10-CM | POA: Diagnosis not present

## 2016-03-15 DIAGNOSIS — E1142 Type 2 diabetes mellitus with diabetic polyneuropathy: Secondary | ICD-10-CM | POA: Diagnosis not present

## 2016-03-15 DIAGNOSIS — Z1389 Encounter for screening for other disorder: Secondary | ICD-10-CM | POA: Diagnosis not present

## 2016-03-15 DIAGNOSIS — C50919 Malignant neoplasm of unspecified site of unspecified female breast: Secondary | ICD-10-CM | POA: Diagnosis not present

## 2016-03-15 DIAGNOSIS — E784 Other hyperlipidemia: Secondary | ICD-10-CM | POA: Diagnosis not present

## 2016-03-15 DIAGNOSIS — E1140 Type 2 diabetes mellitus with diabetic neuropathy, unspecified: Secondary | ICD-10-CM | POA: Diagnosis not present

## 2016-03-15 DIAGNOSIS — E11319 Type 2 diabetes mellitus with unspecified diabetic retinopathy without macular edema: Secondary | ICD-10-CM | POA: Diagnosis not present

## 2016-04-12 DIAGNOSIS — K2970 Gastritis, unspecified, without bleeding: Secondary | ICD-10-CM | POA: Diagnosis not present

## 2016-04-17 ENCOUNTER — Other Ambulatory Visit: Payer: Self-pay | Admitting: Obstetrics and Gynecology

## 2016-04-17 ENCOUNTER — Ambulatory Visit
Admission: RE | Admit: 2016-04-17 | Discharge: 2016-04-17 | Disposition: A | Discharge status: Home / Self Care | Payer: Medicare Other | Source: Ambulatory Visit

## 2016-04-17 ENCOUNTER — Ambulatory Visit
Admission: RE | Admit: 2016-04-17 | Discharge: 2016-04-17 | Disposition: A | Discharge status: Home / Self Care | Payer: Medicare Other | Source: Ambulatory Visit | Attending: Obstetrics and Gynecology | Admitting: Obstetrics and Gynecology

## 2016-04-17 DIAGNOSIS — Z1231 Encounter for screening mammogram for malignant neoplasm of breast: Secondary | ICD-10-CM | POA: Diagnosis not present

## 2016-04-24 DIAGNOSIS — E109 Type 1 diabetes mellitus without complications: Secondary | ICD-10-CM | POA: Diagnosis not present

## 2016-04-24 DIAGNOSIS — E782 Mixed hyperlipidemia: Secondary | ICD-10-CM | POA: Diagnosis not present

## 2016-04-24 DIAGNOSIS — K219 Gastro-esophageal reflux disease without esophagitis: Secondary | ICD-10-CM | POA: Diagnosis not present

## 2016-04-24 DIAGNOSIS — Z853 Personal history of malignant neoplasm of breast: Secondary | ICD-10-CM | POA: Diagnosis not present

## 2016-04-24 DIAGNOSIS — Z0000 Encounter for general adult medical examination without abnormal findings: Secondary | ICD-10-CM | POA: Diagnosis not present

## 2016-04-24 DIAGNOSIS — E559 Vitamin D deficiency, unspecified: Secondary | ICD-10-CM | POA: Diagnosis not present

## 2016-04-24 DIAGNOSIS — Z780 Asymptomatic menopausal state: Secondary | ICD-10-CM | POA: Diagnosis not present

## 2016-04-24 DIAGNOSIS — I10 Essential (primary) hypertension: Secondary | ICD-10-CM | POA: Diagnosis not present

## 2016-04-24 DIAGNOSIS — E039 Hypothyroidism, unspecified: Secondary | ICD-10-CM | POA: Diagnosis not present

## 2016-06-19 DIAGNOSIS — D509 Iron deficiency anemia, unspecified: Secondary | ICD-10-CM | POA: Diagnosis not present

## 2016-07-11 DIAGNOSIS — R600 Localized edema: Secondary | ICD-10-CM | POA: Diagnosis not present

## 2016-07-11 DIAGNOSIS — D6489 Other specified anemias: Secondary | ICD-10-CM | POA: Diagnosis not present

## 2016-07-11 DIAGNOSIS — Z6830 Body mass index (BMI) 30.0-30.9, adult: Secondary | ICD-10-CM | POA: Diagnosis not present

## 2016-07-11 DIAGNOSIS — M810 Age-related osteoporosis without current pathological fracture: Secondary | ICD-10-CM | POA: Diagnosis not present

## 2016-07-11 DIAGNOSIS — K635 Polyp of colon: Secondary | ICD-10-CM | POA: Diagnosis not present

## 2016-07-11 DIAGNOSIS — E038 Other specified hypothyroidism: Secondary | ICD-10-CM | POA: Diagnosis not present

## 2016-07-11 DIAGNOSIS — E11319 Type 2 diabetes mellitus with unspecified diabetic retinopathy without macular edema: Secondary | ICD-10-CM | POA: Diagnosis not present

## 2016-07-11 DIAGNOSIS — E1140 Type 2 diabetes mellitus with diabetic neuropathy, unspecified: Secondary | ICD-10-CM | POA: Diagnosis not present

## 2016-07-11 DIAGNOSIS — E559 Vitamin D deficiency, unspecified: Secondary | ICD-10-CM | POA: Diagnosis not present

## 2016-07-11 DIAGNOSIS — C50919 Malignant neoplasm of unspecified site of unspecified female breast: Secondary | ICD-10-CM | POA: Diagnosis not present

## 2016-07-11 DIAGNOSIS — E784 Other hyperlipidemia: Secondary | ICD-10-CM | POA: Diagnosis not present

## 2016-08-06 DIAGNOSIS — Z23 Encounter for immunization: Secondary | ICD-10-CM | POA: Diagnosis not present

## 2016-08-31 DIAGNOSIS — H40003 Preglaucoma, unspecified, bilateral: Secondary | ICD-10-CM | POA: Diagnosis not present

## 2016-08-31 DIAGNOSIS — E113311 Type 2 diabetes mellitus with moderate nonproliferative diabetic retinopathy with macular edema, right eye: Secondary | ICD-10-CM | POA: Diagnosis not present

## 2016-08-31 DIAGNOSIS — E113312 Type 2 diabetes mellitus with moderate nonproliferative diabetic retinopathy with macular edema, left eye: Secondary | ICD-10-CM | POA: Diagnosis not present

## 2016-09-25 DIAGNOSIS — D509 Iron deficiency anemia, unspecified: Secondary | ICD-10-CM | POA: Diagnosis not present

## 2016-09-28 DIAGNOSIS — D509 Iron deficiency anemia, unspecified: Secondary | ICD-10-CM | POA: Diagnosis not present

## 2016-09-28 DIAGNOSIS — M25551 Pain in right hip: Secondary | ICD-10-CM | POA: Diagnosis not present

## 2016-10-26 ENCOUNTER — Other Ambulatory Visit: Payer: Self-pay | Admitting: Family Medicine

## 2016-10-26 DIAGNOSIS — R209 Unspecified disturbances of skin sensation: Secondary | ICD-10-CM | POA: Diagnosis not present

## 2016-10-27 ENCOUNTER — Ambulatory Visit
Admission: RE | Admit: 2016-10-27 | Discharge: 2016-10-27 | Disposition: A | Discharge status: Home / Self Care | Payer: Medicare Other | Source: Ambulatory Visit | Attending: Family Medicine | Admitting: Family Medicine

## 2016-10-27 ENCOUNTER — Other Ambulatory Visit: Payer: Self-pay | Admitting: Family Medicine

## 2016-10-27 DIAGNOSIS — R200 Anesthesia of skin: Secondary | ICD-10-CM

## 2016-10-28 DIAGNOSIS — B349 Viral infection, unspecified: Secondary | ICD-10-CM | POA: Diagnosis not present

## 2016-11-04 DIAGNOSIS — J019 Acute sinusitis, unspecified: Secondary | ICD-10-CM | POA: Diagnosis not present

## 2016-12-12 DIAGNOSIS — E113311 Type 2 diabetes mellitus with moderate nonproliferative diabetic retinopathy with macular edema, right eye: Secondary | ICD-10-CM | POA: Diagnosis not present

## 2016-12-12 DIAGNOSIS — E113312 Type 2 diabetes mellitus with moderate nonproliferative diabetic retinopathy with macular edema, left eye: Secondary | ICD-10-CM | POA: Diagnosis not present

## 2016-12-14 DIAGNOSIS — E11319 Type 2 diabetes mellitus with unspecified diabetic retinopathy without macular edema: Secondary | ICD-10-CM | POA: Diagnosis not present

## 2016-12-14 DIAGNOSIS — R0789 Other chest pain: Secondary | ICD-10-CM | POA: Diagnosis not present

## 2016-12-14 DIAGNOSIS — I10 Essential (primary) hypertension: Secondary | ICD-10-CM | POA: Diagnosis not present

## 2016-12-14 DIAGNOSIS — E559 Vitamin D deficiency, unspecified: Secondary | ICD-10-CM | POA: Diagnosis not present

## 2016-12-14 DIAGNOSIS — E038 Other specified hypothyroidism: Secondary | ICD-10-CM | POA: Diagnosis not present

## 2016-12-14 DIAGNOSIS — C50919 Malignant neoplasm of unspecified site of unspecified female breast: Secondary | ICD-10-CM | POA: Diagnosis not present

## 2016-12-14 DIAGNOSIS — E1165 Type 2 diabetes mellitus with hyperglycemia: Secondary | ICD-10-CM | POA: Diagnosis not present

## 2016-12-14 DIAGNOSIS — E784 Other hyperlipidemia: Secondary | ICD-10-CM | POA: Diagnosis not present

## 2016-12-14 DIAGNOSIS — D649 Anemia, unspecified: Secondary | ICD-10-CM | POA: Diagnosis not present

## 2016-12-14 DIAGNOSIS — E1140 Type 2 diabetes mellitus with diabetic neuropathy, unspecified: Secondary | ICD-10-CM | POA: Diagnosis not present

## 2016-12-14 DIAGNOSIS — M810 Age-related osteoporosis without current pathological fracture: Secondary | ICD-10-CM | POA: Diagnosis not present

## 2016-12-14 DIAGNOSIS — Z1389 Encounter for screening for other disorder: Secondary | ICD-10-CM | POA: Diagnosis not present

## 2016-12-14 DIAGNOSIS — K635 Polyp of colon: Secondary | ICD-10-CM | POA: Diagnosis not present

## 2016-12-14 DIAGNOSIS — K769 Liver disease, unspecified: Secondary | ICD-10-CM | POA: Diagnosis not present

## 2016-12-14 DIAGNOSIS — E1142 Type 2 diabetes mellitus with diabetic polyneuropathy: Secondary | ICD-10-CM | POA: Diagnosis not present

## 2016-12-15 DIAGNOSIS — D649 Anemia, unspecified: Secondary | ICD-10-CM | POA: Diagnosis not present

## 2017-03-12 DIAGNOSIS — E784 Other hyperlipidemia: Secondary | ICD-10-CM | POA: Diagnosis not present

## 2017-03-12 DIAGNOSIS — E559 Vitamin D deficiency, unspecified: Secondary | ICD-10-CM | POA: Diagnosis not present

## 2017-03-12 DIAGNOSIS — E1140 Type 2 diabetes mellitus with diabetic neuropathy, unspecified: Secondary | ICD-10-CM | POA: Diagnosis not present

## 2017-03-12 DIAGNOSIS — Z6830 Body mass index (BMI) 30.0-30.9, adult: Secondary | ICD-10-CM | POA: Diagnosis not present

## 2017-03-12 DIAGNOSIS — I10 Essential (primary) hypertension: Secondary | ICD-10-CM | POA: Diagnosis not present

## 2017-03-12 DIAGNOSIS — R3989 Other symptoms and signs involving the genitourinary system: Secondary | ICD-10-CM | POA: Diagnosis not present

## 2017-03-12 DIAGNOSIS — E1142 Type 2 diabetes mellitus with diabetic polyneuropathy: Secondary | ICD-10-CM | POA: Diagnosis not present

## 2017-03-12 DIAGNOSIS — E038 Other specified hypothyroidism: Secondary | ICD-10-CM | POA: Diagnosis not present

## 2017-04-25 ENCOUNTER — Encounter: Payer: Self-pay | Admitting: Gastroenterology

## 2017-05-01 DIAGNOSIS — E039 Hypothyroidism, unspecified: Secondary | ICD-10-CM | POA: Diagnosis not present

## 2017-05-01 DIAGNOSIS — Z853 Personal history of malignant neoplasm of breast: Secondary | ICD-10-CM | POA: Diagnosis not present

## 2017-05-01 DIAGNOSIS — D509 Iron deficiency anemia, unspecified: Secondary | ICD-10-CM | POA: Diagnosis not present

## 2017-05-01 DIAGNOSIS — E559 Vitamin D deficiency, unspecified: Secondary | ICD-10-CM | POA: Diagnosis not present

## 2017-05-01 DIAGNOSIS — E2839 Other primary ovarian failure: Secondary | ICD-10-CM | POA: Diagnosis not present

## 2017-05-01 DIAGNOSIS — Z1389 Encounter for screening for other disorder: Secondary | ICD-10-CM | POA: Diagnosis not present

## 2017-05-01 DIAGNOSIS — N3946 Mixed incontinence: Secondary | ICD-10-CM | POA: Diagnosis not present

## 2017-05-01 DIAGNOSIS — Z0000 Encounter for general adult medical examination without abnormal findings: Secondary | ICD-10-CM | POA: Diagnosis not present

## 2017-05-01 DIAGNOSIS — E10311 Type 1 diabetes mellitus with unspecified diabetic retinopathy with macular edema: Secondary | ICD-10-CM | POA: Diagnosis not present

## 2017-05-01 DIAGNOSIS — E782 Mixed hyperlipidemia: Secondary | ICD-10-CM | POA: Diagnosis not present

## 2017-05-01 DIAGNOSIS — I10 Essential (primary) hypertension: Secondary | ICD-10-CM | POA: Diagnosis not present

## 2017-05-01 DIAGNOSIS — K219 Gastro-esophageal reflux disease without esophagitis: Secondary | ICD-10-CM | POA: Diagnosis not present

## 2017-05-04 ENCOUNTER — Other Ambulatory Visit: Payer: Self-pay | Admitting: Family Medicine

## 2017-05-04 DIAGNOSIS — Z1231 Encounter for screening mammogram for malignant neoplasm of breast: Secondary | ICD-10-CM

## 2017-05-10 ENCOUNTER — Encounter: Payer: Self-pay | Admitting: Gastroenterology

## 2017-05-14 DIAGNOSIS — N3941 Urge incontinence: Secondary | ICD-10-CM | POA: Diagnosis not present

## 2017-05-17 DIAGNOSIS — E860 Dehydration: Secondary | ICD-10-CM | POA: Diagnosis not present

## 2017-05-18 ENCOUNTER — Ambulatory Visit: Payer: Medicare Other

## 2017-05-18 DIAGNOSIS — H0015 Chalazion left lower eyelid: Secondary | ICD-10-CM | POA: Diagnosis not present

## 2017-06-08 DIAGNOSIS — N3941 Urge incontinence: Secondary | ICD-10-CM | POA: Diagnosis not present

## 2017-06-08 DIAGNOSIS — Z01419 Encounter for gynecological examination (general) (routine) without abnormal findings: Secondary | ICD-10-CM | POA: Diagnosis not present

## 2017-06-08 DIAGNOSIS — Z1231 Encounter for screening mammogram for malignant neoplasm of breast: Secondary | ICD-10-CM | POA: Diagnosis not present

## 2017-06-08 DIAGNOSIS — R350 Frequency of micturition: Secondary | ICD-10-CM | POA: Diagnosis not present

## 2017-06-08 DIAGNOSIS — R3915 Urgency of urination: Secondary | ICD-10-CM | POA: Diagnosis not present

## 2017-06-08 DIAGNOSIS — N644 Mastodynia: Secondary | ICD-10-CM | POA: Diagnosis not present

## 2017-06-12 ENCOUNTER — Other Ambulatory Visit: Payer: Self-pay | Admitting: Obstetrics and Gynecology

## 2017-06-12 DIAGNOSIS — M8588 Other specified disorders of bone density and structure, other site: Secondary | ICD-10-CM | POA: Diagnosis not present

## 2017-06-12 DIAGNOSIS — Z780 Asymptomatic menopausal state: Secondary | ICD-10-CM | POA: Diagnosis not present

## 2017-06-12 DIAGNOSIS — E2839 Other primary ovarian failure: Secondary | ICD-10-CM | POA: Diagnosis not present

## 2017-06-17 DIAGNOSIS — Z23 Encounter for immunization: Secondary | ICD-10-CM | POA: Diagnosis not present

## 2017-06-19 ENCOUNTER — Emergency Department (HOSPITAL_COMMUNITY)
Admission: EM | Admit: 2017-06-19 | Discharge: 2017-06-19 | Disposition: A | Discharge status: Home / Self Care | Payer: Medicare Other | Attending: Emergency Medicine | Admitting: Emergency Medicine

## 2017-06-19 ENCOUNTER — Emergency Department (HOSPITAL_COMMUNITY): Payer: Medicare Other

## 2017-06-19 ENCOUNTER — Encounter (HOSPITAL_COMMUNITY): Payer: Self-pay | Admitting: Emergency Medicine

## 2017-06-19 DIAGNOSIS — Z79899 Other long term (current) drug therapy: Secondary | ICD-10-CM | POA: Diagnosis not present

## 2017-06-19 DIAGNOSIS — I10 Essential (primary) hypertension: Secondary | ICD-10-CM | POA: Insufficient documentation

## 2017-06-19 DIAGNOSIS — E785 Hyperlipidemia, unspecified: Secondary | ICD-10-CM | POA: Diagnosis not present

## 2017-06-19 DIAGNOSIS — Z794 Long term (current) use of insulin: Secondary | ICD-10-CM | POA: Diagnosis not present

## 2017-06-19 DIAGNOSIS — R079 Chest pain, unspecified: Secondary | ICD-10-CM | POA: Diagnosis not present

## 2017-06-19 DIAGNOSIS — D649 Anemia, unspecified: Secondary | ICD-10-CM

## 2017-06-19 DIAGNOSIS — R0789 Other chest pain: Secondary | ICD-10-CM | POA: Diagnosis not present

## 2017-06-19 DIAGNOSIS — E119 Type 2 diabetes mellitus without complications: Secondary | ICD-10-CM | POA: Diagnosis not present

## 2017-06-19 DIAGNOSIS — E039 Hypothyroidism, unspecified: Secondary | ICD-10-CM | POA: Insufficient documentation

## 2017-06-19 DIAGNOSIS — Z853 Personal history of malignant neoplasm of breast: Secondary | ICD-10-CM | POA: Diagnosis not present

## 2017-06-19 DIAGNOSIS — J9811 Atelectasis: Secondary | ICD-10-CM | POA: Diagnosis not present

## 2017-06-19 LAB — HEPATIC FUNCTION PANEL
ALK PHOS: 51 U/L (ref 38–126)
ALT: 15 U/L (ref 14–54)
AST: 30 U/L (ref 15–41)
Albumin: 3.2 g/dL — ABNORMAL LOW (ref 3.5–5.0)
BILIRUBIN INDIRECT: 0.5 mg/dL (ref 0.3–0.9)
BILIRUBIN TOTAL: 0.6 mg/dL (ref 0.3–1.2)
Bilirubin, Direct: 0.1 mg/dL (ref 0.1–0.5)
TOTAL PROTEIN: 6.3 g/dL — AB (ref 6.5–8.1)

## 2017-06-19 LAB — CBC
HEMATOCRIT: 27.1 % — AB (ref 36.0–46.0)
Hemoglobin: 8.7 g/dL — ABNORMAL LOW (ref 12.0–15.0)
MCH: 28.5 pg (ref 26.0–34.0)
MCHC: 32.1 g/dL (ref 30.0–36.0)
MCV: 88.9 fL (ref 78.0–100.0)
Platelets: 361 10*3/uL (ref 150–400)
RBC: 3.05 MIL/uL — ABNORMAL LOW (ref 3.87–5.11)
RDW: 12.9 % (ref 11.5–15.5)
WBC: 8.2 10*3/uL (ref 4.0–10.5)

## 2017-06-19 LAB — BASIC METABOLIC PANEL
Anion gap: 7 (ref 5–15)
BUN: 15 mg/dL (ref 6–20)
CHLORIDE: 101 mmol/L (ref 101–111)
CO2: 27 mmol/L (ref 22–32)
Calcium: 8.8 mg/dL — ABNORMAL LOW (ref 8.9–10.3)
Creatinine, Ser: 0.94 mg/dL (ref 0.44–1.00)
GFR calc Af Amer: >60 mL/min (ref >60)
GFR calc non Af Amer: 58 mL/min — ABNORMAL LOW (ref >60)
Glucose, Bld: 342 mg/dL — ABNORMAL HIGH (ref 65–99)
POTASSIUM: 5.2 mmol/L — AB (ref 3.5–5.1)
Sodium: 135 mmol/L (ref 135–145)

## 2017-06-19 LAB — POC OCCULT BLOOD, ED: Fecal Occult Bld: NEGATIVE

## 2017-06-19 LAB — I-STAT TROPONIN, ED: Troponin i, poc: 0 ng/mL (ref 0.00–0.08)

## 2017-06-19 NOTE — ED Notes (Signed)
Patient transported to X-ray 

## 2017-06-19 NOTE — Discharge Instructions (Signed)
Continue your medications as previously prescribed.  Ibuprofen 600 mg every 6 hours as needed for pain.  Return to the emergency department if your symptoms significantly worsen or change.

## 2017-06-19 NOTE — ED Provider Notes (Signed)
[facility] DEPT Provider Note   CSN: [id number] Arrival date & time: 06/19/17  0904     History   Chief Complaint Chief Complaint  Patient presents with  . Chest Pain    HPI Elizabeth Olsen is a 76 y.o. female.  Patient is a 76 year old female with past medical history of diabetes, hypertension, and chronic anemia with suspected GI source. She presents today for evaluation of increased fatigue for the past 2 weeks and chest discomfort that started 2 days ago. She reports a pressure in the center of her chest with no associated diaphoresis, shortness of breath, or radiation. She does report having dark stools, but does take iron twice daily. She has had gastritis in the past and reports taking an antacid for this. She has also had an endoscopy which has revealed a gastritis, however biopsies were negative.   The history is provided by the patient.  Chest Pain   This is a new problem. Episode onset: 3 days ago. The problem occurs constantly. The problem has not changed since onset.The pain is associated with movement and walking. The pain is present in the substernal region. The pain is mild. The quality of the pain is described as pressure-like. The pain does not radiate. Associated symptoms include weakness. Pertinent negatives include no cough, no diaphoresis, no fever, no lower extremity edema, no nausea and no shortness of breath.    Past Medical History:  Diagnosis Date  . Anemia   . Blood transfusion   . Blood transfusion without reported diagnosis    with breast surgery  . Breast cancer ([place]) 15 years ago   left   . 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 Diabetes mellitus   . Heart murmur    as child  . Hernia, incisional   . Hyperlipidemia   . Hypertension   . Hypothyroidism   . Vitamin D deficiency     There are no active problems to display for this patient.   Past Surgical History:  Procedure Laterality Date  . ABDOMINAL HYSTERECTOMY    . APPENDECTOMY      . COLONOSCOPY  2013   due next 03-2017  . IUD REMOVAL     with appendectomy  . MASTECTOMY, MODIFIED RADICAL W/RECONSTRUCTION Left 15 years ago   10 nodes out  . TUMOR EXCISION Left    x 2, neck, head    OB History    No data available       Home Medications    Prior to Admission medications   Medication Sig Start Date End Date Taking? Authorizing Provider  fenofibrate 160 MG tablet Take 160 mg by mouth daily.    [provider]  fish oil-omega-3 fatty acids 1000 MG capsule Take 2 g by mouth daily.     [provider]  fluconazole (DIFLUCAN) 150 MG tablet Take 1 tablet every day for 14 days  then off 14 days as one cycle, and repeat 3 more cycles. 06/30/15   Hyatt, Max T, DPM  HUMALOG KWIKPEN 100 UNIT/ML KiwkPen INJECT 3 UNITS WITH MEALS PLUS SLIDING SCALE (ISF 50, TARGET 100). 30 UNITS DAILY 05/17/15   [provider]  insulin aspart protamine- aspart (NOVOLOG MIX 70/30) (70-30) 100 UNIT/ML injection Inject 10-30 Units into the skin 2 (two) times daily with a meal. 30 units in the am and 10 units in the pm    [provider]  LANTUS SOLOSTAR 100 UNIT/ML Solostar Pen INJECT 24 UNITS ONCE DAILY 05/17/15  [provider]  levothyroxine (SYNTHROID, LEVOTHROID) 112 MCG tablet Take 112 mcg by mouth daily before breakfast.    [provider]  losartan (COZAAR) 100 MG tablet Take 100 mg by mouth daily.    [provider]  Multiple Vitamins-Minerals (MULTIVITAMIN WITH MINERALS) tablet Take 1 tablet by mouth daily.    [provider]  Vitamin D, Ergocalciferol, (DRISDOL) 50000 UNITS CAPS Take 50,000 Units by mouth 2 (two) times a week.  02/20/12   [provider]    Family History Family History  Problem Relation Age of Onset  . Colon cancer Neg Hx   . Diabetes Other        both sides of family  . Hypertension Father   . Hypertension Maternal Grandfather   . Hypertension Maternal Grandmother   . Stomach  cancer Maternal Grandmother        GGM    Social History Social History  Substance Use Topics  . Smoking status: Never Smoker  . Smokeless tobacco: Never Used  . Alcohol use No     Allergies   Erythromycin; Penicillins; Nyquil [pseudoeph-doxylamine-dm-apap]; and Sulfa antibiotics   Review of Systems Review of Systems  Constitutional: Negative for diaphoresis and fever.  Respiratory: Negative for cough and shortness of breath.   Cardiovascular: Positive for chest pain.  Gastrointestinal: Negative for nausea.  Neurological: Positive for weakness.  All other systems reviewed and are negative.    Physical Exam Updated Vital Signs BP (!) 157/66 (BP Location: Right Arm)   Pulse 82   Temp 97.6 F (36.4 C) (Oral)   Resp 18   SpO2 98%   Physical Exam  Constitutional: She is oriented to person, place, and time. She appears well-developed and well-nourished. No distress.  HENT:  Head: Normocephalic and atraumatic.  Neck: Normal range of motion. Neck supple.  Cardiovascular: Normal rate and regular rhythm.  Exam reveals no gallop and no friction rub.   No murmur heard. Pulmonary/Chest: Effort normal and breath sounds normal. No respiratory distress. She has no wheezes.  Abdominal: Soft. Bowel sounds are normal. She exhibits no distension. There is no tenderness.  Genitourinary:  Genitourinary Comments: Rectal examination reveals scant stool in the rectal vault. It is brown in color and guaiac negative. There are no obvious hemorrhoids or other lesions palpable.  Musculoskeletal: Normal range of motion. She exhibits no edema.  Neurological: She is alert and oriented to person, place, and time.  Skin: Skin is warm and dry. She is not diaphoretic.  Nursing note and vitals reviewed.    ED Treatments / Results  Labs (all labs ordered are listed, but only abnormal results are displayed) Labs Reviewed  BASIC METABOLIC PANEL - Abnormal; Notable for the following:       Result  Value   Potassium 5.2 (*)    Glucose, Bld 342 (*)    Calcium 8.8 (*)    GFR calc non Af Amer 58 (*)    All other components within normal limits  CBC - Abnormal; Notable for the following:    RBC 3.05 (*)    Hemoglobin 8.7 (*)    HCT 27.1 (*)    All other components within normal limits  HEPATIC FUNCTION PANEL  I-STAT TROPONIN, ED    EKG  EKG Interpretation  Date/Time:  Tuesday June 19 2017 09:07:00 EDT Ventricular Rate:  87 PR Interval:  168 QRS Duration: 90 QT Interval:  388 QTC Calculation: 466 R Axis:   61 Text Interpretation:  Normal sinus  rhythm Normal ECG Confirmed by Veryl Speak [phone]) on 06/19/2017 9:26:26 AM       Radiology Dg Chest 2 View  Result Date: 06/19/2017 CLINICAL DATA:  Chest heaviness over the last few days, worse recently, fatigue, history of breast carcinoma EXAM: CHEST  2 VIEW COMPARISON:  Chest x-ray of 03/29/2014 FINDINGS: Minimal linear scarring or atelectasis is noted at both lung bases. No pneumonia or effusion is seen. Mediastinal and hilar contours are unremarkable. The heart is within upper limits normal. The bones appear somewhat osteopenic with mild degenerative change in the mid to lower thoracic spine. Surgical clips overlie the left breast and lateral chest. IMPRESSION: Mild linear atelectasis or scarring at both lung bases. No definite active process. Electronically Signed   By: Ivar Drape M.D.   On: 06/19/2017 09:51    Procedures Procedures (including critical care time)  Medications Ordered in ED Medications - No data to display   Initial Impression / Assessment and Plan / ED Course  I have reviewed the triage vital signs and the nursing notes.  Pertinent labs & imaging results that were available during my care of the patient were reviewed by me and considered in my medical decision making (see chart for details).  Patient presents with complaints of chest discomfort. She reports pain in the left lower chest that has been  ongoing for the past 3 days. There is no exertional component and her symptoms are worse when she lies on that side and presses on it. There is nothing today that would indicate a cardiac etiology. Her EKG is unchanged and troponin is negative despite 3 days of symptoms.  She does have a worsening anemia with her hemoglobin of 8.7. She is known to have an anemia and is taking iron twice daily. She reports dark stools, however her stools today are heme-negative. He has an appointment in the next few weeks with her gastroenterologist for a colonoscopy and I will advise her to keep this.  I see no indication for admission and highly doubt a cardiac etiology. She does have a history of breast cancer with some scarring in her breast. Due to the nature of her symptoms this may well be the cause. She is to take ibuprofen as needed and follow-up with her primary doctor.  Final Clinical Impressions(s) / ED Diagnoses   Final diagnoses:  None    New Prescriptions New Prescriptions   No medications on file     Veryl Speak, MD 06/19/17 1103

## 2017-06-19 NOTE — ED Triage Notes (Signed)
Pt sts left sided chest pain and SOB x 3 days

## 2017-07-04 ENCOUNTER — Telehealth: Payer: Self-pay | Admitting: *Deleted

## 2017-07-04 ENCOUNTER — Ambulatory Visit (AMBULATORY_SURGERY_CENTER): Payer: Self-pay | Admitting: *Deleted

## 2017-07-04 VITALS — Ht 62.0 in | Wt 165.4 lb

## 2017-07-04 DIAGNOSIS — Z86010 Personal history of colonic polyps: Secondary | ICD-10-CM

## 2017-07-04 MED ORDER — NA SULFATE-K SULFATE-MG SULF 17.5-3.13-1.6 GM/177ML PO SOLN: ORAL | 0 refills | Status: DC

## 2017-07-04 NOTE — Progress Notes (Signed)
No allergies to eggs or soy. No problems with anesthesia.  Pt given Emmi instructions for colonoscopy  No oxygen use  No diet drug use  

## 2017-07-04 NOTE — Telephone Encounter (Signed)
Dr Fuller Plan: pt is scheduled for recall colonoscopy for hx colon polyps 08/09/2017.  Pt had ER visit for chest pain 8/21 and Hgb 8/7. Chest pain determined to be non cardiac.  Is pt ok for direct colonoscopy?  Thanks, Juliann Pulse in [facility]

## 2017-07-05 NOTE — Telephone Encounter (Signed)
Yes ok for direct colonoscopy

## 2017-07-06 NOTE — Telephone Encounter (Signed)
Pt notified okay to proceed as scheduled per Dr Fuller Plan  As direct colon   Elizabeth Olsen

## 2017-07-10 DIAGNOSIS — D649 Anemia, unspecified: Secondary | ICD-10-CM | POA: Diagnosis not present

## 2017-07-10 DIAGNOSIS — I10 Essential (primary) hypertension: Secondary | ICD-10-CM | POA: Diagnosis not present

## 2017-07-10 DIAGNOSIS — K769 Liver disease, unspecified: Secondary | ICD-10-CM | POA: Diagnosis not present

## 2017-07-10 DIAGNOSIS — E1142 Type 2 diabetes mellitus with diabetic polyneuropathy: Secondary | ICD-10-CM | POA: Diagnosis not present

## 2017-07-10 DIAGNOSIS — M810 Age-related osteoporosis without current pathological fracture: Secondary | ICD-10-CM | POA: Diagnosis not present

## 2017-07-10 DIAGNOSIS — R0602 Shortness of breath: Secondary | ICD-10-CM | POA: Diagnosis not present

## 2017-07-10 DIAGNOSIS — E784 Other hyperlipidemia: Secondary | ICD-10-CM | POA: Diagnosis not present

## 2017-07-10 DIAGNOSIS — K635 Polyp of colon: Secondary | ICD-10-CM | POA: Diagnosis not present

## 2017-07-10 DIAGNOSIS — E038 Other specified hypothyroidism: Secondary | ICD-10-CM | POA: Diagnosis not present

## 2017-07-10 DIAGNOSIS — Z6829 Body mass index (BMI) 29.0-29.9, adult: Secondary | ICD-10-CM | POA: Diagnosis not present

## 2017-07-10 DIAGNOSIS — E1165 Type 2 diabetes mellitus with hyperglycemia: Secondary | ICD-10-CM | POA: Diagnosis not present

## 2017-07-10 DIAGNOSIS — C50919 Malignant neoplasm of unspecified site of unspecified female breast: Secondary | ICD-10-CM | POA: Diagnosis not present

## 2017-07-11 ENCOUNTER — Telehealth (HOSPITAL_COMMUNITY): Payer: Self-pay | Admitting: Endocrinology

## 2017-07-12 ENCOUNTER — Other Ambulatory Visit: Payer: Self-pay | Admitting: Endocrinology

## 2017-07-12 DIAGNOSIS — R0602 Shortness of breath: Secondary | ICD-10-CM

## 2017-07-13 NOTE — Telephone Encounter (Signed)
User: Elizabeth Olsen A Date/time: 07/11/17 9:50 AM  Comment: Called pt and lmsg for her to CB to get sch for echo.  Context:  Outcome: Left Message  Phone number: [phone] Phone Type: Home Phone  Comm. type: Telephone Call type: Outgoing  Contact: Elizabeth Olsen E Relation to patient: Self

## 2017-07-18 ENCOUNTER — Other Ambulatory Visit: Payer: Self-pay

## 2017-07-18 ENCOUNTER — Encounter: Payer: Medicare Other | Admitting: Gastroenterology

## 2017-07-18 ENCOUNTER — Ambulatory Visit (HOSPITAL_COMMUNITY): Payer: Medicare Other | Attending: Cardiovascular Disease

## 2017-07-18 DIAGNOSIS — E119 Type 2 diabetes mellitus without complications: Secondary | ICD-10-CM | POA: Diagnosis not present

## 2017-07-18 DIAGNOSIS — E785 Hyperlipidemia, unspecified: Secondary | ICD-10-CM | POA: Insufficient documentation

## 2017-07-18 DIAGNOSIS — I10 Essential (primary) hypertension: Secondary | ICD-10-CM | POA: Diagnosis not present

## 2017-07-18 DIAGNOSIS — R0602 Shortness of breath: Secondary | ICD-10-CM | POA: Insufficient documentation

## 2017-07-23 ENCOUNTER — Encounter: Payer: Self-pay | Admitting: Cardiology

## 2017-07-23 ENCOUNTER — Ambulatory Visit (INDEPENDENT_AMBULATORY_CARE_PROVIDER_SITE_OTHER): Payer: Medicare Other | Admitting: Cardiology

## 2017-07-23 VITALS — BP 150/66 | HR 68 | Resp 10 | Ht 62.5 in | Wt 159.0 lb

## 2017-07-23 DIAGNOSIS — Z794 Long term (current) use of insulin: Secondary | ICD-10-CM | POA: Diagnosis not present

## 2017-07-23 DIAGNOSIS — I429 Cardiomyopathy, unspecified: Secondary | ICD-10-CM | POA: Insufficient documentation

## 2017-07-23 DIAGNOSIS — E611 Iron deficiency: Secondary | ICD-10-CM | POA: Insufficient documentation

## 2017-07-23 DIAGNOSIS — E782 Mixed hyperlipidemia: Secondary | ICD-10-CM

## 2017-07-23 DIAGNOSIS — E559 Vitamin D deficiency, unspecified: Secondary | ICD-10-CM | POA: Diagnosis not present

## 2017-07-23 DIAGNOSIS — I420 Dilated cardiomyopathy: Secondary | ICD-10-CM | POA: Diagnosis not present

## 2017-07-23 DIAGNOSIS — R7989 Other specified abnormal findings of blood chemistry: Secondary | ICD-10-CM

## 2017-07-23 DIAGNOSIS — E119 Type 2 diabetes mellitus without complications: Secondary | ICD-10-CM | POA: Insufficient documentation

## 2017-07-23 DIAGNOSIS — E1139 Type 2 diabetes mellitus with other diabetic ophthalmic complication: Secondary | ICD-10-CM

## 2017-07-23 DIAGNOSIS — E109 Type 1 diabetes mellitus without complications: Secondary | ICD-10-CM | POA: Insufficient documentation

## 2017-07-23 DIAGNOSIS — E785 Hyperlipidemia, unspecified: Secondary | ICD-10-CM | POA: Insufficient documentation

## 2017-07-23 HISTORY — DX: Iron deficiency: E61.1

## 2017-07-23 HISTORY — DX: Cardiomyopathy, unspecified: I42.9

## 2017-07-23 HISTORY — DX: Type 1 diabetes mellitus without complications: E10.9

## 2017-07-23 MED ORDER — CARVEDILOL 3.125 MG PO TABS: 3.1250 mg | ORAL_TABLET | Freq: Two times a day (BID) | ORAL | 4 refills | Status: DC

## 2017-07-23 NOTE — Progress Notes (Signed)
Cardiology Consultation:    Date:  07/23/2017   ID:  ANACLARA ACKLIN, DOB 01-14-1941, MRN [id number]  PCP:  Elizabeth Dus, MD  Cardiologist:  Elizabeth Campus, MD   Referring MD: Elizabeth Dus, MD   Chief Complaint  Patient presents with  . Abnormal Echo  I'm not sure why I'm here  History of Present Illness:    Elizabeth Olsen is a 76 y.o. female who is being seen today for the evaluation of cardiac myopathy at the request of Elizabeth Dus, MD. Elizabeth Olsen been complaining of weakness fatigue and tiredness as well as swelling of lower extremities for last few months. Echo was done which showed ejection fraction of 45-50%. She denies having any tightness squeezing pressure burning in the chest, exertional shortness of breath is the leading complaint.. Swelling of lower extremities happens at night.she does not have difficulty sleeping at night, no other cardiac complaints. Never had coronary artery disease, never had myocardial infarction. Does have family history of heart trouble but no coronary artery disease that is premature  Past Medical History:  Diagnosis Date  . Anemia   . Blood transfusion   . Blood transfusion without reported diagnosis    with breast surgery  . Breast cancer ([place]) 15 years ago   left   . 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 Diabetes mellitus   . Heart murmur    as child  . Hernia, incisional   . Hyperlipidemia   . Hypertension   . Hypothyroidism   . Vitamin D deficiency     Past Surgical History:  Procedure Laterality Date  . ABDOMINAL HYSTERECTOMY    . APPENDECTOMY    . COLONOSCOPY  2013   due next 03-2017  . IUD REMOVAL     with appendectomy  . MASTECTOMY, MODIFIED RADICAL W/RECONSTRUCTION Left 15 years ago   10 nodes out  . TUMOR EXCISION Left    x 2, neck, head    Current Medications: Current Meds  Medication Sig  . calcium carbonate (OSCAL) 1500 (600 Ca) MG TABS tablet Take by mouth daily with breakfast.  . fenofibrate 160 MG tablet  Take 160 mg by mouth daily.  . fish oil-omega-3 fatty acids 1000 MG capsule Take 2 g by mouth daily.   Marland Kitchen HUMALOG KWIKPEN 100 UNIT/ML KiwkPen INJECT 3 UNITS WITH MEALS PLUS SLIDING SCALE (ISF 50, TARGET 100). 30 UNITS DAILY  . iron polysaccharides (NIFEREX) 150 MG capsule Take 150 mg by mouth daily.  Marland Kitchen LANTUS SOLOSTAR 100 UNIT/ML Solostar Pen INJECT 24 UNITS ONCE DAILY  . levothyroxine (SYNTHROID, LEVOTHROID) 112 MCG tablet Take 112 mcg by mouth daily before breakfast.  . losartan (COZAAR) 100 MG tablet Take 100 mg by mouth daily.  . Multiple Vitamins-Minerals (MULTIVITAMIN WITH MINERALS) tablet Take 1 tablet by mouth daily.  . Na Sulfate-K Sulfate-Mg Sulf (SUPREP BOWEL PREP KIT) 17.5-3.13-1.6 GM/180ML SOLN suprep as directed.  No substitutions  . ranitidine (ZANTAC) 300 MG capsule Take 300 mg by mouth every evening.  . Vitamin D, Ergocalciferol, (DRISDOL) 50000 UNITS CAPS Take 50,000 Units by mouth 2 (two) times a week.      Allergies:   Erythromycin; Penicillins; Nyquil [pseudoeph-doxylamine-dm-apap]; and Sulfa antibiotics   Social History   Social History  . Marital status: Married    Spouse name: N/A  . Number of children: 3  . Years of education: N/A   Occupational History  . homemaker    Social History Main Topics  . Smoking status: Never  Smoker  . Smokeless tobacco: Never Used  . Alcohol use No  . Drug use: No  . Sexual activity: Not Asked   Other Topics Concern  . None   Social History Narrative  . None     Family History: The patient's family history includes Congestive Heart Failure in her father; Diabetes in her other; Heart attack in her brother; Hypertension in her father, maternal grandfather, and maternal grandmother; Peripheral vascular disease in her father; Stomach cancer in her maternal grandmother. There is no history of Colon cancer. ROS:   Please see the history of present illness.    All 14 point review of systems negative except as described per  history of present illness.  EKGs/Labs/Other Studies Reviewed:    The following studies were reviewed today: Echocardiogram showing ejection fraction 45-50%  EKG:  EKG is  ordered today.  The ekg ordered today demonstrates normal sinus rhythm, normal. Interval, normal QS complex duration morphology no ST-T segment changes  Recent Labs: 06/19/2017: ALT 15; BUN 15; Creatinine, Ser 0.94; Hemoglobin 8.7; Platelets 361; Potassium 5.2; Sodium 135  Recent Lipid Panel No results found for: CHOL, TRIG, HDL, CHOLHDL, VLDL, LDLCALC, LDLDIRECT  Physical Exam:    VS:  BP (!) 150/66   Pulse 68   Resp 10   Ht 5' 2.5" (1.588 m)   Wt 159 lb (72.1 kg)   BMI 28.62 kg/m     Wt Readings from Last 3 Encounters:  07/23/17 159 lb (72.1 kg)  07/04/17 165 lb 6.4 oz (75 kg)  08/21/14 160 lb (72.6 kg)     GEN:  Well nourished, well developed in no acute distress HEENT: Normal NECK: No JVD; No carotid bruits LYMPHATICS: No lymphadenopathy CARDIAC: RRR, no murmurs, no rubs, no gallops RESPIRATORY:  Clear to auscultation without rales, wheezing or rhonchi  ABDOMEN: Soft, non-tender, non-distended MUSCULOSKELETAL:  No edema; No deformity  SKIN: Warm and dry NEUROLOGIC:  Alert and oriented x 3 PSYCHIATRIC:  Normal affect   ASSESSMENT:    1. Type 2 diabetes mellitus with other ophthalmic complication, with long-term current use of insulin ([place])   2. Dilated cardiomyopathy ([place])   3. Mixed hyperlipidemia    PLAN:    In order of problems listed above:  1. Dilated cardiomyopathy: She is already on ARB which I will continue, I will add carvedilol 3.125 twice daily to her medical regimen.it looks like she will not be candidate for Aldactone secondary to her potassium being high. In terms of etiology I will ask her to have a stress test to make sure there is no inducible ischemia. Ideally she needs to be an aspirin however she is anemic today and I will check her CBC today as well. 2. Dyslipidemia: She  can benefit from high intensity statin but I will check herfasting lipid profile today to establish baseline. 3. Possibility of coronary artery disease:aking into the consideration multiple risk factors for it I will schedule her to have a stress test. 4. Diabetes mellitus poorly controlled. She definitely need to tighten her diabetes control.   Medication Adjustments/Labs and Tests Ordered: Current medicines are reviewed at length with the patient today.  Concerns regarding medicines are outlined above.  No orders of the defined types were placed in this encounter.  No orders of the defined types were placed in this encounter.   Signed, Park Liter, MD, [facility]. 07/23/2017 3:03 PM    [facility]

## 2017-07-23 NOTE — Patient Instructions (Addendum)
Medication Instructions:  Your physician has recommended you make the following change in your medication:  1.) START carvedilol (Coreg) 3.125 mg twice daily.   Labwork: Your physician recommends that you have lab work done today to check your blood cell count, cholesterol, kidneys and sodium/potassium level.   Testing/Procedures: EKG in office today  Your physician has requested that you have a lexiscan myoview. For further information please visit [email or web address]. Please follow instruction sheet, as given.  Please report to [address] [place], [place] the day of your testing.   Follow-Up: Your physician recommends that you schedule a follow-up appointment in: 3 weeks    Any Other Special Instructions Will Be Listed Below (If Applicable).  Please note that any paperwork needing to be filled out by the provider will need to be addressed at the front desk prior to seeing the provider. Please note that any paperwork FMLA, Disability or other documents regarding health condition is subject to a $25.00 charge that must be received prior to completion of paperwork in the form of a money order or check.    If you need a refill on your cardiac medications before your next appointment, please call your pharmacy.

## 2017-07-24 ENCOUNTER — Telehealth (HOSPITAL_COMMUNITY): Payer: Self-pay | Admitting: *Deleted

## 2017-07-24 ENCOUNTER — Telehealth: Payer: Self-pay | Admitting: Cardiology

## 2017-07-24 LAB — CBC
Hematocrit: 30.5 % — ABNORMAL LOW (ref 34.0–46.6)
Hemoglobin: 9.7 g/dL — ABNORMAL LOW (ref 11.1–15.9)
MCH: 28.3 pg (ref 26.6–33.0)
MCHC: 31.8 g/dL (ref 31.5–35.7)
MCV: 89 fL (ref 79–97)
Platelets: 436 10*3/uL — ABNORMAL HIGH (ref 150–379)
RBC: 3.43 x10E6/uL — AB (ref 3.77–5.28)
RDW: 13.8 % (ref 12.3–15.4)
WBC: 8.4 10*3/uL (ref 3.4–10.8)

## 2017-07-24 LAB — BASIC METABOLIC PANEL
BUN / CREAT RATIO: 18 (ref 12–28)
BUN: 22 mg/dL (ref 8–27)
CO2: 28 mmol/L (ref 20–29)
CREATININE: 1.19 mg/dL — AB (ref 0.57–1.00)
Calcium: 9.7 mg/dL (ref 8.7–10.3)
Chloride: 100 mmol/L (ref 96–106)
GFR calc Af Amer: 52 mL/min/{1.73_m2} — ABNORMAL LOW (ref >59)
GFR, EST NON AFRICAN AMERICAN: 45 mL/min/{1.73_m2} — AB (ref >59)
Glucose: 83 mg/dL (ref 65–99)
Potassium: 4.2 mmol/L (ref 3.5–5.2)
SODIUM: 142 mmol/L (ref 134–144)

## 2017-07-24 LAB — LIPID PANEL
CHOLESTEROL TOTAL: 184 mg/dL (ref 100–199)
Chol/HDL Ratio: 2.4 ratio (ref 0.0–4.4)
HDL: 77 mg/dL (ref >39)
LDL Calculated: 89 mg/dL (ref 0–99)
Triglycerides: 91 mg/dL (ref 0–149)
VLDL CHOLESTEROL CAL: 18 mg/dL (ref 5–40)

## 2017-07-24 LAB — VITAMIN D 25 HYDROXY (VIT D DEFICIENCY, FRACTURES): Vit D, 25-Hydroxy: 48.5 ng/mL (ref 30.0–100.0)

## 2017-07-24 NOTE — Telephone Encounter (Signed)
wabts to know about her moms visit yesterday

## 2017-07-24 NOTE — Telephone Encounter (Signed)
Left message for patient's daughter to call back. 

## 2017-07-24 NOTE — Telephone Encounter (Signed)
Patient's daughter given detailed instructions per Myocardial Perfusion Study Information Sheet for the test on 07/27/17 at 0715. Patient notified to arrive 15 minutes early and that it is imperative to arrive on time for appointment to keep from having the test rescheduled.  If you need to cancel or reschedule your appointment, please call the office within 24 hours of your appointment. . Patient verbalized understanding.Thos Matsumoto, Ranae [place]ms

## 2017-07-25 ENCOUNTER — Telehealth: Payer: Self-pay

## 2017-07-25 MED ORDER — ATORVASTATIN CALCIUM 10 MG PO TABS: 10.0000 mg | ORAL_TABLET | Freq: Every day | ORAL | 11 refills | Status: DC

## 2017-07-25 NOTE — Telephone Encounter (Signed)
-----   Message from Park Liter, MD sent at 07/24/2017  4:18 PM EDT ----- Labs ar eacceptable, start Lipitor 10 mg po qd

## 2017-07-25 NOTE — Telephone Encounter (Signed)
S/w pt and daughter regarding results. Also discussed all questions and concerns that daughter had regarding the test and reason for the stress test. Also discussed the echo performed by a previous provider.

## 2017-07-27 ENCOUNTER — Ambulatory Visit (HOSPITAL_COMMUNITY): Payer: Medicare Other | Attending: Cardiovascular Disease

## 2017-07-27 DIAGNOSIS — R079 Chest pain, unspecified: Secondary | ICD-10-CM | POA: Diagnosis not present

## 2017-07-27 DIAGNOSIS — I10 Essential (primary) hypertension: Secondary | ICD-10-CM | POA: Insufficient documentation

## 2017-07-27 DIAGNOSIS — E109 Type 1 diabetes mellitus without complications: Secondary | ICD-10-CM | POA: Insufficient documentation

## 2017-07-27 DIAGNOSIS — E785 Hyperlipidemia, unspecified: Secondary | ICD-10-CM | POA: Insufficient documentation

## 2017-07-27 DIAGNOSIS — I420 Dilated cardiomyopathy: Secondary | ICD-10-CM | POA: Diagnosis not present

## 2017-07-27 MED ORDER — TECHNETIUM TC 99M TETROFOSMIN IV KIT
10.2000 | PACK | Freq: Once | INTRAVENOUS | Status: AC | PRN
  Administered 2017-07-27: 10.2 via INTRAVENOUS
  Filled 2017-07-27: qty 11

## 2017-07-27 MED ORDER — TECHNETIUM TC 99M TETROFOSMIN IV KIT
31.8000 | PACK | Freq: Once | INTRAVENOUS | Status: AC | PRN
  Administered 2017-07-27: 31.8 via INTRAVENOUS
  Filled 2017-07-27: qty 32

## 2017-07-27 MED ORDER — REGADENOSON 0.4 MG/5ML IV SOLN
0.4000 mg | Freq: Once | INTRAVENOUS | Status: AC
  Administered 2017-07-27: 0.4 mg via INTRAVENOUS

## 2017-07-30 LAB — MYOCARDIAL PERFUSION IMAGING
CHL CUP NUCLEAR SDS: 2
CHL CUP NUCLEAR SRS: 2
CHL CUP NUCLEAR SSS: 4
CSEPPHR: 92 {beats}/min
LV dias vol: 115 mL (ref 46–106)
LV sys vol: 54 mL
RATE: 0.32
Rest HR: 82 {beats}/min
TID: 0.94

## 2017-08-07 ENCOUNTER — Telehealth: Payer: Self-pay | Admitting: Gastroenterology

## 2017-08-07 NOTE — Telephone Encounter (Signed)
Pt wanted to discuss what she could have PO/Meds wise on the day before compared to the day of the procedure.  RN advised and answered all her questions. Elizabeth Olsen/PV

## 2017-08-09 ENCOUNTER — Encounter: Payer: Self-pay | Admitting: Gastroenterology

## 2017-08-09 ENCOUNTER — Ambulatory Visit (AMBULATORY_SURGERY_CENTER): Payer: Medicare Other | Admitting: Gastroenterology

## 2017-08-09 VITALS — BP 158/78 | HR 80 | Temp 98.0°F | Resp 19 | Ht 62.0 in | Wt 165.0 lb

## 2017-08-09 DIAGNOSIS — D124 Benign neoplasm of descending colon: Secondary | ICD-10-CM

## 2017-08-09 DIAGNOSIS — I10 Essential (primary) hypertension: Secondary | ICD-10-CM | POA: Diagnosis not present

## 2017-08-09 DIAGNOSIS — E119 Type 2 diabetes mellitus without complications: Secondary | ICD-10-CM | POA: Diagnosis not present

## 2017-08-09 DIAGNOSIS — Z86010 Personal history of colonic polyps: Secondary | ICD-10-CM

## 2017-08-09 MED ORDER — SODIUM CHLORIDE 0.9 % IV SOLN: 500.0000 mL | INTRAVENOUS | Status: DC

## 2017-08-09 NOTE — Op Note (Signed)
[facility] Patient Name: Elizabeth Olsen Procedure Date: 08/09/2017 11:23 AM MRN: [id number] Endoscopist: Ladene Artist , MD Age: 76 Referring MD:  Date of Birth: Feb 04, 1941 Gender: Female Account #: [id number] Procedure:                Colonoscopy Indications:              Surveillance: Personal history of adenomatous                            polyps on last colonoscopy 5 years ago Medicines:                Monitored Anesthesia Care Procedure:                Pre-Anesthesia Assessment:                           - Prior to the procedure, a History and Physical                            was performed, and patient medications and                            allergies were reviewed. The patient's tolerance of                            previous anesthesia was also reviewed. The risks                            and benefits of the procedure and the sedation                            options and risks were discussed with the patient.                            All questions were answered, and informed consent                            was obtained. Prior Anticoagulants: The patient has                            taken no previous anticoagulant or antiplatelet                            agents. ASA Grade Assessment: II - A patient with                            mild systemic disease. After reviewing the risks                            and benefits, the patient was deemed in                            satisfactory condition to undergo the procedure.  After obtaining informed consent, the colonoscope                            was passed under direct vision. Throughout the                            procedure, the patient's blood pressure, pulse, and                            oxygen saturations were monitored continuously. The                            Colonoscope was introduced through the anus and                            advanced to the the cecum,  identified by                            appendiceal orifice and ileocecal valve. The                            ileocecal valve, appendiceal orifice, and rectum                            were photographed. The quality of the bowel                            preparation was adequate. The colonoscopy was                            performed without difficulty. The patient tolerated                            the procedure well. Scope In: 11:30:56 AM Scope Out: 62:37:62 AM Scope Withdrawal Time: 0 hours 12 minutes 13 seconds  Total Procedure Duration: 0 hours 15 minutes 45 seconds  Findings:                 The perianal and digital rectal examinations were                            normal.                           A 7 mm polyp was found in the descending colon. The                            polyp was sessile. The polyp was removed with a                            cold snare. Resection and retrieval were complete.                           Internal hemorrhoids were found during  retroflexion. The hemorrhoids were small and Grade                            I (internal hemorrhoids that do not prolapse).                           The exam was otherwise without abnormality on                            direct and retroflexion views. Complications:            No immediate complications. Estimated blood loss:                            None. Estimated Blood Loss:     Estimated blood loss: none. Impression:               - One 7 mm polyp in the descending colon, removed                            with a cold snare. Resected and retrieved.                           - Internal hemorrhoids.                           - The examination was otherwise normal on direct                            and retroflexion views. Recommendation:           - Patient has a contact number available for                            emergencies. The signs and symptoms of potential                             delayed complications were discussed with the                            patient. Return to normal activities tomorrow.                            Written discharge instructions were provided to the                            patient.                           - Resume previous diet.                           - Continue present medications.                           - Await pathology results.                           -  No repeat colonoscopy due to age. Ladene Artist, MD 08/09/2017 11:53:22 AM This report has been signed electronically.

## 2017-08-09 NOTE — Progress Notes (Signed)
Pt took Humalog 5 units yesterday evening per pt. maw

## 2017-08-09 NOTE — Patient Instructions (Signed)
YOU HAD AN ENDOSCOPIC PROCEDURE TODAY AT THE [facility]:   Refer to the procedure report that was given to you for any specific questions about what was found during the examination.  If the procedure report does not answer your questions, please call your gastroenterologist to clarify.  If you requested that your care partner not be given the details of your procedure findings, then the procedure report has been included in a sealed envelope for you to review at your convenience later.  YOU SHOULD EXPECT: Some feelings of bloating in the abdomen. Passage of more gas than usual.  Walking can help get rid of the air that was put into your GI tract during the procedure and reduce the bloating. If you had a lower endoscopy (such as a colonoscopy or flexible sigmoidoscopy) you may notice spotting of blood in your stool or on the toilet paper. If you underwent a bowel prep for your procedure, you may not have a normal bowel movement for a few days.  Please Note:  You might notice some irritation and congestion in your nose or some drainage.  This is from the oxygen used during your procedure.  There is no need for concern and it should clear up in a day or so.  SYMPTOMS TO REPORT IMMEDIATELY:   Following lower endoscopy (colonoscopy or flexible sigmoidoscopy):  Excessive amounts of blood in the stool  Significant tenderness or worsening of abdominal pains  Swelling of the abdomen that is new, acute  Fever of 100F or higher   For urgent or emergent issues, a gastroenterologist can be reached at any hour by calling [phone].   DIET:  We do recommend a small meal at first, but then you may proceed to your regular diet.  Drink plenty of fluids but you should avoid alcoholic beverages for 24 hours.  ACTIVITY:  You should plan to take it easy for the rest of today and you should NOT DRIVE or use heavy machinery until tomorrow (because of the sedation medicines used during the test).     FOLLOW UP: Our staff will call the number listed on your records the next business day following your procedure to check on you and address any questions or concerns that you may have regarding the information given to you following your procedure. If we do not reach you, we will leave a message.  However, if you are feeling well and you are not experiencing any problems, there is no need to return our call.  We will assume that you have returned to your regular daily activities without incident.  If any biopsies were taken you will be contacted by phone or by letter within the next 1-3 weeks.  Please call us at [phone] if you have not heard about the biopsies in 3 weeks.    SIGNATURES/CONFIDENTIALITY: You and/or your care partner have signed paperwork which will be entered into your electronic medical record.  These signatures attest to the fact that that the information above on your After Visit Summary has been reviewed and is understood.  Full responsibility of the confidentiality of this discharge information lies with you and/or your care-partner.  Read all handouts given to you by your recovery room nurse. 

## 2017-08-09 NOTE — Progress Notes (Signed)
Pt's states no medical or surgical changes since previsit or office visit. maw 

## 2017-08-09 NOTE — Progress Notes (Signed)
Called to room to assist during endoscopic procedure.  Patient ID and intended procedure confirmed with present staff. Received instructions for my participation in the procedure from the performing physician.  

## 2017-08-09 NOTE — Progress Notes (Signed)
Lantus insulin pt takes 12 units in the am and 56 units with dinner per pt. Maw  Pt had breast cancer - she reported 20 years ago.  Pt requested not to have b/p cuff on her lower extremity.  She said the last time b/p was taken on her right lower leg, it caused sever pain and pt said she still has pain on her right lower leg.  I asked her to discuss this with her CRNA.  maw

## 2017-08-09 NOTE — Progress Notes (Signed)
Report given to PACU, vss 

## 2017-08-10 ENCOUNTER — Ambulatory Visit (INDEPENDENT_AMBULATORY_CARE_PROVIDER_SITE_OTHER): Payer: Medicare Other | Admitting: Cardiology

## 2017-08-10 ENCOUNTER — Encounter: Payer: Self-pay | Admitting: Cardiology

## 2017-08-10 ENCOUNTER — Telehealth: Payer: Self-pay | Admitting: *Deleted

## 2017-08-10 VITALS — BP 118/62 | HR 80 | Resp 12 | Ht 62.5 in | Wt 164.0 lb

## 2017-08-10 DIAGNOSIS — I429 Cardiomyopathy, unspecified: Secondary | ICD-10-CM

## 2017-08-10 DIAGNOSIS — E611 Iron deficiency: Secondary | ICD-10-CM

## 2017-08-10 DIAGNOSIS — E1139 Type 2 diabetes mellitus with other diabetic ophthalmic complication: Secondary | ICD-10-CM

## 2017-08-10 DIAGNOSIS — E782 Mixed hyperlipidemia: Secondary | ICD-10-CM | POA: Diagnosis not present

## 2017-08-10 DIAGNOSIS — Z794 Long term (current) use of insulin: Secondary | ICD-10-CM | POA: Diagnosis not present

## 2017-08-10 MED ORDER — CARVEDILOL 6.25 MG PO TABS: 6.2500 mg | ORAL_TABLET | Freq: Two times a day (BID) | ORAL | 11 refills | Status: DC

## 2017-08-10 NOTE — Telephone Encounter (Signed)
  Follow up Call-  Call back number 08/09/2017  Post procedure Call Back phone  # [phone] cell  Permission to leave phone message Yes  Some recent data might be hidden     Patient questions:  Do you have a fever, pain , or abdominal swelling? No. Pain Score  0 *  Have you tolerated food without any problems? Yes.    Have you been able to return to your normal activities? Yes.    Do you have any questions about your discharge instructions: Diet   No. Medications  No. Follow up visit  No.  Do you have questions or concerns about your Care? No.  Actions: * If pain score is 4 or above: No action needed, pain <4.

## 2017-08-10 NOTE — Patient Instructions (Addendum)
Medication Instructions:  Your physician has recommended you make the following change in your medication:  INCREASE carvedilol 6.25 mg twice daily.  Labwork: Your physician recommends that you return for lab work in: today. BMP  Testing/Procedures: You had an EKG today.  Follow-Up: Your physician recommends that you schedule a follow-up appointment in: 1 month.  Any Other Special Instructions Will Be Listed Below (If Applicable).     If you need a refill on your cardiac medications before your next appointment, please call your pharmacy.

## 2017-08-12 LAB — BASIC METABOLIC PANEL
BUN / CREAT RATIO: 18 (ref 12–28)
BUN: 17 mg/dL (ref 8–27)
CO2: 24 mmol/L (ref 20–29)
CREATININE: 0.93 mg/dL (ref 0.57–1.00)
Calcium: 9 mg/dL (ref 8.7–10.3)
Chloride: 102 mmol/L (ref 96–106)
GFR calc non Af Amer: 60 mL/min/{1.73_m2} (ref >59)
GFR, EST AFRICAN AMERICAN: 70 mL/min/{1.73_m2} (ref >59)
Glucose: 153 mg/dL — ABNORMAL HIGH (ref 65–99)
Potassium: 4.3 mmol/L (ref 3.5–5.2)
Sodium: 141 mmol/L (ref 134–144)

## 2017-08-21 DIAGNOSIS — E1140 Type 2 diabetes mellitus with diabetic neuropathy, unspecified: Secondary | ICD-10-CM | POA: Diagnosis not present

## 2017-08-21 DIAGNOSIS — D6489 Other specified anemias: Secondary | ICD-10-CM | POA: Diagnosis not present

## 2017-08-21 DIAGNOSIS — Z794 Long term (current) use of insulin: Secondary | ICD-10-CM | POA: Diagnosis not present

## 2017-08-21 DIAGNOSIS — Z6830 Body mass index (BMI) 30.0-30.9, adult: Secondary | ICD-10-CM | POA: Diagnosis not present

## 2017-08-21 DIAGNOSIS — E1139 Type 2 diabetes mellitus with other diabetic ophthalmic complication: Secondary | ICD-10-CM | POA: Diagnosis not present

## 2017-08-21 DIAGNOSIS — I10 Essential (primary) hypertension: Secondary | ICD-10-CM | POA: Diagnosis not present

## 2017-08-22 ENCOUNTER — Encounter: Payer: Self-pay | Admitting: Gastroenterology

## 2017-08-22 DIAGNOSIS — E038 Other specified hypothyroidism: Secondary | ICD-10-CM | POA: Diagnosis not present

## 2017-08-28 DIAGNOSIS — D509 Iron deficiency anemia, unspecified: Secondary | ICD-10-CM | POA: Diagnosis not present

## 2017-08-28 DIAGNOSIS — R079 Chest pain, unspecified: Secondary | ICD-10-CM | POA: Diagnosis not present

## 2017-08-28 NOTE — Progress Notes (Signed)
Cardiology Office Note:    Date:  08/28/2017   ID:  Elizabeth Olsen, DOB 1941-09-01, MRN [id number]  PCP:  [name], MD  Cardiologist:  [name], MD    Referring MD: [name], MD   Chief Complaint  Patient presents with  . 3 week follow up  I am doing well  History of Present Illness:    Elizabeth Olsen is a 76 y.o. female with recently diagnosed cardiomyopathy.  She came to [place] for management.  We will also trying to establish the reason for cardiomyopathy.  She did have ischemia workup in form of stress testing which showed no evidence of ischemia.  Overall she seems to be doing well.  Denies have any chest pain tightness squeezing pressure burning in the chest.  Past Medical History:  Diagnosis Date  . Anemia   . Blood transfusion   . Blood transfusion without reported diagnosis    with breast surgery  . Breast cancer ([place]) 15 years ago   left   . 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 Diabetes mellitus   . Heart murmur    as child  . Hernia, incisional   . Hyperlipidemia   . Hypertension   . Hypothyroidism   . Vitamin D deficiency     Past Surgical History:  Procedure Laterality Date  . ABDOMINAL HYSTERECTOMY    . APPENDECTOMY    . COLONOSCOPY  2013   due next 03-2017  . IUD REMOVAL     with appendectomy  . MASTECTOMY, MODIFIED RADICAL W/RECONSTRUCTION Left 15 years ago   10 nodes out  . TUMOR EXCISION Left    x 2, neck, head    Current Medications: Current Meds  Medication Sig  . atorvastatin (LIPITOR) 10 MG tablet Take 1 tablet (10 mg total) by mouth daily.  . calcium carbonate (OSCAL) 1500 (600 Ca) MG TABS tablet Take by mouth daily with breakfast.  . carvedilol (COREG) 6.25 MG tablet Take 1 tablet (6.25 mg total) by mouth 2 (two) times daily.  . fenofibrate 160 MG tablet Take 160 mg by mouth daily.  . fish oil-omega-3 fatty acids 1000 MG capsule Take 2 g by mouth daily.   Marland Kitchen HUMALOG KWIKPEN 100 UNIT/ML KiwkPen INJECT 3 UNITS WITH MEALS  PLUS SLIDING SCALE (ISF 50, TARGET 100). 30 UNITS DAILY  . iron polysaccharides (NIFEREX) 150 MG capsule Take 150 mg by mouth daily.  Marland Kitchen LANTUS SOLOSTAR 100 UNIT/ML Solostar Pen Inject 12 units in the morning and 6 units in the evening  . levothyroxine (SYNTHROID, LEVOTHROID) 112 MCG tablet Take 112 mcg by mouth daily before breakfast.  . losartan (COZAAR) 100 MG tablet Take 100 mg by mouth daily.  . Multiple Vitamins-Minerals (MULTIVITAMIN WITH MINERALS) tablet Take 1 tablet by mouth daily.  . ranitidine (ZANTAC) 300 MG capsule Take 300 mg by mouth every evening.  . Vitamin D, Ergocalciferol, (DRISDOL) 50000 UNITS CAPS Take 50,000 Units by mouth 2 (two) times a week.   . [DISCONTINUED] carvedilol (COREG) 3.125 MG tablet Take 1 tablet (3.125 mg total) by mouth 2 (two) times daily.     Allergies:   Erythromycin; Penicillins; Nyquil [pseudoeph-doxylamine-dm-apap]; and Sulfa antibiotics   Social History   Social History  . Marital status: Married    Spouse name: N/A  . Number of children: 3  . Years of education: N/A   Occupational History  . homemaker    Social History Main Topics  . Smoking status: Never Smoker  .  Smokeless tobacco: Never Used  . Alcohol use No  . Drug use: No  . Sexual activity: Not Asked   Other Topics Concern  . None   Social History Narrative  . None     Family History: The patient's family history includes Congestive Heart Failure in her father; Diabetes in her other; Heart attack in her brother; Hypertension in her father, maternal grandfather, and maternal grandmother; Peripheral vascular disease in her father; Stomach cancer in her maternal grandmother. There is no history of Colon cancer, Esophageal cancer, Pancreatic cancer, or Rectal cancer. ROS:   Please see the history of present illness.    All 14 point review of systems negative except as described per history of present illness  EKGs/Labs/Other Studies Reviewed:      Recent  Labs: 06/19/2017: ALT 15 07/23/2017: Hemoglobin 9.7; Platelets 436 08/10/2017: BUN 17; Creatinine, Ser 0.93; Potassium 4.3; Sodium 141  Recent Lipid Panel    Component Value Date/Time   CHOL 184 07/23/2017 1538   TRIG 91 07/23/2017 1538   HDL 77 07/23/2017 1538   CHOLHDL 2.4 07/23/2017 1538   LDLCALC 89 07/23/2017 1538    Physical Exam:    VS:  BP 118/62   Pulse 80   Resp 12   Ht 5' 2.5" (1.588 m)   Wt 164 lb (74.4 kg)   BMI 29.52 kg/m     Wt Readings from Last 3 Encounters:  08/10/17 164 lb (74.4 kg)  08/09/17 165 lb (74.8 kg)  07/23/17 159 lb (72.1 kg)     GEN:  Well nourished, well developed in no acute distress HEENT: Normal NECK: No JVD; No carotid bruits LYMPHATICS: No lymphadenopathy CARDIAC: RRR, no murmurs, no rubs, no gallops RESPIRATORY:  Clear to auscultation without rales, wheezing or rhonchi  ABDOMEN: Soft, non-tender, non-distended MUSCULOSKELETAL:  No edema; No deformity  SKIN: Warm and dry LOWER EXTREMITIES: no swelling NEUROLOGIC:  Alert and oriented x 3 PSYCHIATRIC:  Normal affect   ASSESSMENT:    1. Cardiomyopathy, unspecified type ([place])   2. Type 2 diabetes mellitus with other ophthalmic complication, with long-term current use of insulin ([place])   3. Mixed hyperlipidemia   4. Iron deficiency    PLAN:    In order of problems listed above:  1. Cardiomyopathy: Appears to be nonischemic.  She is already on carvedilol I will increase the dose of the medication.  Rest of the medication will be unchanged available see her back within a month to intensify her medical therapy. 2. Diabetes: Apparently improving. 3. Dyslipidemia: We will continue with statin. 4. Iron deficiency anemia followed by primary care physician   Medication Adjustments/Labs and Tests Ordered: Current medicines are reviewed at length with the patient today.  Concerns regarding medicines are outlined above.  Orders Placed This Encounter  Procedures  . Basic metabolic panel   . EKG 12-Lead   Medication changes:  Meds ordered this encounter  Medications  . carvedilol (COREG) 6.25 MG tablet    Sig: Take 1 tablet (6.25 mg total) by mouth 2 (two) times daily.    Dispense:  60 tablet    Refill:  11    Signed, Park Liter, MD, [facility] 08/28/2017 11:30 AM    [place]

## 2017-09-06 ENCOUNTER — Encounter: Payer: Self-pay | Admitting: Cardiology

## 2017-09-06 ENCOUNTER — Ambulatory Visit (INDEPENDENT_AMBULATORY_CARE_PROVIDER_SITE_OTHER): Payer: Medicare Other | Admitting: Cardiology

## 2017-09-06 ENCOUNTER — Other Ambulatory Visit: Payer: Self-pay | Admitting: Cardiology

## 2017-09-06 VITALS — BP 170/80 | HR 72 | Resp 12 | Ht 62.5 in | Wt 166.0 lb

## 2017-09-06 DIAGNOSIS — Z794 Long term (current) use of insulin: Secondary | ICD-10-CM

## 2017-09-06 DIAGNOSIS — I420 Dilated cardiomyopathy: Secondary | ICD-10-CM

## 2017-09-06 DIAGNOSIS — E782 Mixed hyperlipidemia: Secondary | ICD-10-CM

## 2017-09-06 DIAGNOSIS — E1139 Type 2 diabetes mellitus with other diabetic ophthalmic complication: Secondary | ICD-10-CM

## 2017-09-06 NOTE — Patient Instructions (Addendum)
Medication Instructions:  Your physician recommends that you continue on your current medications as directed. Please refer to the Current Medication list given to you today.  Labwork: Your physician recommends that you have lab work today to check your fluid congestion level and Kidney function as well as sodium level and potassium.   Testing/Procedures: None   Follow-Up: Your physician recommends that you schedule a follow-up appointment in: 1 months   Any Other Special Instructions Will Be Listed Below (If Applicable).  Please note that any paperwork needing to be filled out by the provider will need to be addressed at the front desk prior to seeing the provider. Please note that any paperwork FMLA, Disability or other documents regarding health condition is subject to a $25.00 charge that must be received prior to completion of paperwork in the form of a money order or check.     If you need a refill on your cardiac medications before your next appointment, please call your pharmacy.

## 2017-09-06 NOTE — Progress Notes (Signed)
Cardiology Office Note:    Date:  09/06/2017   ID:  [name], DOB 1941-01-16, MRN [id number]  PCP:  [name], MD  Cardiologist:  [name], MD    Referring MD: [name], MD   Chief Complaint  Patient presents with  . 4 week follow up  Having some shortness of breath  History of Present Illness:    Elizabeth Olsen is a 76 y.o. female with nonischemic cardiomyopathy, ejection fraction 45-50% on ARB as well as beta-blocker.  Doing well but complained of having one episodes then she developed shortness of breath at night.  Also described to have some minimal swelling of lower extremities.  Exercise is also being some shortness of breath.  On a physical exam I do not see any gross evidence of overloaded with fluid and become present congestive heart failure but to clarify this I will ask him to have proBNP as well as Chem-7.  Past Medical History:  Diagnosis Date  . Anemia   . Blood transfusion   . Blood transfusion without reported diagnosis    with breast surgery  . Breast cancer ([place]) 15 years ago   left   . 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 Diabetes mellitus   . Heart murmur    as child  . Hernia, incisional   . Hyperlipidemia   . Hypertension   . Hypothyroidism   . Vitamin D deficiency     Past Surgical History:  Procedure Laterality Date  . ABDOMINAL HYSTERECTOMY    . APPENDECTOMY    . COLONOSCOPY  2013   due next 03-2017  . IUD REMOVAL     with appendectomy  . MASTECTOMY, MODIFIED RADICAL W/RECONSTRUCTION Left 15 years ago   10 nodes out  . TUMOR EXCISION Left    x 2, neck, head    Current Medications: Current Meds  Medication Sig  . atorvastatin (LIPITOR) 10 MG tablet Take 1 tablet (10 mg total) by mouth daily.  . calcium carbonate (OSCAL) 1500 (600 Ca) MG TABS tablet Take by mouth daily with breakfast.  . carvedilol (COREG) 6.25 MG tablet Take 1 tablet (6.25 mg total) by mouth 2 (two) times daily.  . fenofibrate 160 MG tablet Take  160 mg by mouth daily.  . fish oil-omega-3 fatty acids 1000 MG capsule Take 2 g by mouth daily.   Marland Kitchen HUMALOG KWIKPEN 100 UNIT/ML KiwkPen INJECT 3 UNITS WITH MEALS PLUS SLIDING SCALE (ISF 50, TARGET 100). 30 UNITS DAILY  . iron polysaccharides (NIFEREX) 150 MG capsule Take 150 mg by mouth daily.  Marland Kitchen LANTUS SOLOSTAR 100 UNIT/ML Solostar Pen Inject 12 units in the morning and 6 units in the evening  . levothyroxine (SYNTHROID, LEVOTHROID) 112 MCG tablet Take 112 mcg by mouth daily before breakfast.  . losartan (COZAAR) 100 MG tablet Take 100 mg by mouth daily.  . Multiple Vitamins-Minerals (MULTIVITAMIN WITH MINERALS) tablet Take 1 tablet by mouth daily.  . ranitidine (ZANTAC) 300 MG capsule Take 300 mg by mouth every evening.  . Vitamin D, Ergocalciferol, (DRISDOL) 50000 UNITS CAPS Take 50,000 Units by mouth 2 (two) times a week.      Allergies:   Erythromycin; Penicillins; Nyquil [pseudoeph-doxylamine-dm-apap]; and Sulfa antibiotics   Social History   Socioeconomic History  . Marital status: Married    Spouse name: None  . Number of children: 3  . Years of education: None  . Highest education level: None  Social Needs  . Financial resource strain: None  .  Food insecurity - worry: None  . Food insecurity - inability: None  . Transportation needs - medical: None  . Transportation needs - non-medical: None  Occupational History  . Occupation: homemaker  Tobacco Use  . Smoking status: Never Smoker  . Smokeless tobacco: Never Used  Substance and Sexual Activity  . Alcohol use: No  . Drug use: No  . Sexual activity: None  Other Topics Concern  . None  Social History Narrative  . None     Family History: The patient's family history includes Congestive Heart Failure in her father; Diabetes in her other; Heart attack in her brother; Hypertension in her father, maternal grandfather, and maternal grandmother; Peripheral vascular disease in her father; Stomach cancer in her maternal  grandmother. There is no history of Colon cancer, Esophageal cancer, Pancreatic cancer, or Rectal cancer. ROS:   Please see the history of present illness.    All 14 point review of systems negative except as described per history of present illness  EKGs/Labs/Other Studies Reviewed:      Recent Labs: 06/19/2017: ALT 15 07/23/2017: Hemoglobin 9.7; Platelets 436 08/10/2017: BUN 17; Creatinine, Ser 0.93; Potassium 4.3; Sodium 141  Recent Lipid Panel    Component Value Date/Time   CHOL 184 07/23/2017 1538   TRIG 91 07/23/2017 1538   HDL 77 07/23/2017 1538   CHOLHDL 2.4 07/23/2017 1538   LDLCALC 89 07/23/2017 1538    Physical Exam:    VS:  BP (!) 170/80   Pulse 72   Resp 12   Ht 5' 2.5" (1.588 m)   Wt 166 lb (75.3 kg)   BMI 29.88 kg/m     Wt Readings from Last 3 Encounters:  09/06/17 166 lb (75.3 kg)  08/10/17 164 lb (74.4 kg)  08/09/17 165 lb (74.8 kg)     GEN:  Well nourished, well developed in no acute distress HEENT: Normal NECK: No JVD; No carotid bruits LYMPHATICS: No lymphadenopathy CARDIAC: RRR, no murmurs, no rubs, no gallops RESPIRATORY:  Clear to auscultation without rales, wheezing or rhonchi  ABDOMEN: Soft, non-tender, non-distended MUSCULOSKELETAL:  No edema; No deformity  SKIN: Warm and dry LOWER EXTREMITIES: Minimal swelling NEUROLOGIC:  Alert and oriented x 3 PSYCHIATRIC:  Normal affect   ASSESSMENT:    1. Dilated cardiomyopathy ([place])   2. Type 2 diabetes mellitus with other ophthalmic complication, with long-term current use of insulin ([place])   3. Mixed hyperlipidemia    PLAN:    In order of problems listed above:  1. Dilated cardiomyopathy: On appropriate medications which I will continue Chem-7 and proBNP will be checked 2. Next dyslipidemia continue with statin. 3. Type 2 diabetes doing well from the point of view actually better she is sticking with the diet much better than before   Medication Adjustments/Labs and Tests  Ordered: Current medicines are reviewed at length with the patient today.  Concerns regarding medicines are outlined above.  Orders Placed This Encounter  Procedures  . Basic metabolic panel  . Brain natriuretic peptide   Medication changes: No orders of the defined types were placed in this encounter.   Signed, Park Liter, MD, [facility] 09/06/2017 11:16 AM    [place]

## 2017-09-08 LAB — BASIC METABOLIC PANEL
BUN/Creatinine Ratio: 16 (ref 12–28)
BUN: 17 mg/dL (ref 8–27)
CO2: 28 mmol/L (ref 20–29)
CREATININE: 1.09 mg/dL — AB (ref 0.57–1.00)
Calcium: 9.1 mg/dL (ref 8.7–10.3)
Chloride: 100 mmol/L (ref 96–106)
GFR calc Af Amer: 57 mL/min/{1.73_m2} — ABNORMAL LOW (ref >59)
GFR, EST NON AFRICAN AMERICAN: 50 mL/min/{1.73_m2} — AB (ref >59)
GLUCOSE: 216 mg/dL — AB (ref 65–99)
POTASSIUM: 5.3 mmol/L — AB (ref 3.5–5.2)
Sodium: 141 mmol/L (ref 134–144)

## 2017-09-08 LAB — BRAIN NATRIURETIC PEPTIDE: BNP: 263.3 pg/mL — AB (ref 0.0–100.0)

## 2017-09-12 ENCOUNTER — Telehealth: Payer: Self-pay | Admitting: Cardiology

## 2017-09-12 NOTE — Telephone Encounter (Signed)
S/w pt and discussed lab results. Per Dr. Agustin Cree, pt should decrease Losartan to 50 mg daily. Pt is currently out of town and will not return for a few months. We will recheck her labs when she returns for her follow up. Pt encouraged to call our office with any problems or concerns.

## 2017-09-12 NOTE — Telephone Encounter (Signed)
States someone was to call her with lab results

## 2017-10-02 DIAGNOSIS — N289 Disorder of kidney and ureter, unspecified: Secondary | ICD-10-CM | POA: Diagnosis not present

## 2017-10-02 DIAGNOSIS — D509 Iron deficiency anemia, unspecified: Secondary | ICD-10-CM | POA: Diagnosis not present

## 2017-10-03 DIAGNOSIS — Z6829 Body mass index (BMI) 29.0-29.9, adult: Secondary | ICD-10-CM | POA: Diagnosis not present

## 2017-10-03 DIAGNOSIS — Z794 Long term (current) use of insulin: Secondary | ICD-10-CM | POA: Diagnosis not present

## 2017-10-03 DIAGNOSIS — I10 Essential (primary) hypertension: Secondary | ICD-10-CM | POA: Diagnosis not present

## 2017-10-03 DIAGNOSIS — E1140 Type 2 diabetes mellitus with diabetic neuropathy, unspecified: Secondary | ICD-10-CM | POA: Diagnosis not present

## 2017-10-04 ENCOUNTER — Ambulatory Visit (INDEPENDENT_AMBULATORY_CARE_PROVIDER_SITE_OTHER): Payer: Medicare Other | Admitting: Cardiology

## 2017-10-04 ENCOUNTER — Encounter: Payer: Self-pay | Admitting: Cardiology

## 2017-10-04 VITALS — BP 160/80 | HR 81 | Ht 62.5 in | Wt 160.0 lb

## 2017-10-04 DIAGNOSIS — E1139 Type 2 diabetes mellitus with other diabetic ophthalmic complication: Secondary | ICD-10-CM

## 2017-10-04 DIAGNOSIS — I420 Dilated cardiomyopathy: Secondary | ICD-10-CM

## 2017-10-04 DIAGNOSIS — Z794 Long term (current) use of insulin: Secondary | ICD-10-CM | POA: Diagnosis not present

## 2017-10-04 DIAGNOSIS — E782 Mixed hyperlipidemia: Secondary | ICD-10-CM

## 2017-10-04 DIAGNOSIS — E611 Iron deficiency: Secondary | ICD-10-CM

## 2017-10-04 NOTE — Progress Notes (Signed)
Cardiology Office Note:    Date:  10/04/2017   ID:  Elizabeth Olsen, DOB May 10, 1941, MRN [id number]  PCP:  [name], MD  Cardiologist:  [name], MD    Referring MD: [name], MD   Chief Complaint  Patient presents with  . Follow-up  Doing well  History of Present Illness:    Elizabeth Olsen is a 76 y.o. female with cardiomyopathy which appears to be nonischemic in origin.  She was put on appropriate medications however about month and a half ago she stopped taking her medications.  She lives in [place] and [place] and she simply forget to take her medication with her and that is why she did not take it now she is back to normal, and a back on medications for about a week.  In the meantime when she was not taking medication she complained of having some shortness of breath at night.  Now she is feeling better.  Denies having any chest pain tightness squeezing pressure burning in her chest.  Past Medical History:  Diagnosis Date  . Anemia   . Blood transfusion   . Blood transfusion without reported diagnosis    with breast surgery  . Breast cancer ([place]) 15 years ago   left   . 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 Diabetes mellitus   . Heart murmur    as child  . Hernia, incisional   . Hyperlipidemia   . Hypertension   . Hypothyroidism   . Vitamin D deficiency     Past Surgical History:  Procedure Laterality Date  . ABDOMINAL HYSTERECTOMY    . APPENDECTOMY    . COLONOSCOPY  2013   due next 03-2017  . IUD REMOVAL     with appendectomy  . MASTECTOMY, MODIFIED RADICAL W/RECONSTRUCTION Left 15 years ago   10 nodes out  . TUMOR EXCISION Left    x 2, neck, head    Current Medications: Current Meds  Medication Sig  . atorvastatin (LIPITOR) 10 MG tablet Take 1 tablet (10 mg total) by mouth daily.  . calcium carbonate (OSCAL) 1500 (600 Ca) MG TABS tablet Take by mouth daily with breakfast.  . carvedilol (COREG) 6.25 MG tablet Take 1 tablet (6.25 mg  total) by mouth 2 (two) times daily.  . fenofibrate 160 MG tablet Take 160 mg by mouth daily.  . fish oil-omega-3 fatty acids 1000 MG capsule Take 2 g by mouth daily.   Marland Kitchen HUMALOG KWIKPEN 100 UNIT/ML KiwkPen INJECT 3 UNITS WITH MEALS PLUS SLIDING SCALE (ISF 50, TARGET 100). 30 UNITS DAILY  . iron polysaccharides (NIFEREX) 150 MG capsule Take 150 mg by mouth daily.  Marland Kitchen LANTUS SOLOSTAR 100 UNIT/ML Solostar Pen Inject 12 units in the morning and 6 units in the evening  . levothyroxine (SYNTHROID, LEVOTHROID) 112 MCG tablet Take 112 mcg by mouth daily before breakfast.  . losartan (COZAAR) 100 MG tablet Take 100 mg by mouth daily.  . Multiple Vitamins-Minerals (MULTIVITAMIN WITH MINERALS) tablet Take 1 tablet by mouth daily.  Glory Rosebush VERIO test strip USE TO TEST UP TO 10 TIMES D  . ranitidine (ZANTAC) 300 MG capsule Take 300 mg by mouth every evening.  . Vitamin D, Ergocalciferol, (DRISDOL) 50000 UNITS CAPS Take 50,000 Units by mouth 2 (two) times a week.      Allergies:   Erythromycin; Penicillins; Nyquil [pseudoeph-doxylamine-dm-apap]; and Sulfa antibiotics   Social History   Socioeconomic History  . Marital status: Married  Spouse name: None  . Number of children: 3  . Years of education: None  . Highest education level: None  Social Needs  . Financial resource strain: None  . Food insecurity - worry: None  . Food insecurity - inability: None  . Transportation needs - medical: None  . Transportation needs - non-medical: None  Occupational History  . Occupation: homemaker  Tobacco Use  . Smoking status: Never Smoker  . Smokeless tobacco: Never Used  Substance and Sexual Activity  . Alcohol use: No  . Drug use: No  . Sexual activity: None  Other Topics Concern  . None  Social History Narrative  . None     Family History: The patient's family history includes Congestive Heart Failure in her father; Diabetes in her other; Heart attack in her brother; Hypertension in her  father, maternal grandfather, and maternal grandmother; Peripheral vascular disease in her father; Stomach cancer in her maternal grandmother. There is no history of Colon cancer, Esophageal cancer, Pancreatic cancer, or Rectal cancer. ROS:   Please see the history of present illness.    All 14 point review of systems negative except as described per history of present illness  EKGs/Labs/Other Studies Reviewed:      Recent Labs: 06/19/2017: ALT 15 07/23/2017: Hemoglobin 9.7; Platelets 436 09/06/2017: BNP 263.3; BUN 17; Creatinine, Ser 1.09; Potassium 5.3; Sodium 141  Recent Lipid Panel    Component Value Date/Time   CHOL 184 07/23/2017 1538   TRIG 91 07/23/2017 1538   HDL 77 07/23/2017 1538   CHOLHDL 2.4 07/23/2017 1538   LDLCALC 89 07/23/2017 1538    Physical Exam:    VS:  BP (!) 160/80 (BP Location: Right Arm, Patient Position: Sitting, Cuff Size: Normal)   Pulse 81   Ht 5' 2.5" (1.588 m)   Wt 160 lb (72.6 kg)   SpO2 97%   BMI 28.80 kg/m     Wt Readings from Last 3 Encounters:  10/04/17 160 lb (72.6 kg)  09/06/17 166 lb (75.3 kg)  08/10/17 164 lb (74.4 kg)     GEN:  Well nourished, well developed in no acute distress HEENT: Normal NECK: No JVD; No carotid bruits LYMPHATICS: No lymphadenopathy CARDIAC: RRR, no murmurs, no rubs, no gallops RESPIRATORY:  Clear to auscultation without rales, wheezing or rhonchi  ABDOMEN: Soft, non-tender, non-distended MUSCULOSKELETAL:  No edema; No deformity  SKIN: Warm and dry LOWER EXTREMITIES: no swelling NEUROLOGIC:  Alert and oriented x 3 PSYCHIATRIC:  Normal affect   ASSESSMENT:    1. Dilated cardiomyopathy ([place])   2. Type 2 diabetes mellitus with other ophthalmic complication, with long-term current use of insulin ([place])   3. Mixed hyperlipidemia   4. Iron deficiency    PLAN:    In order of problems listed above:  1. Dilated cardia myopathy back on appropriate medications I wanted to repeat echocardiogram now obviously  since she did not take medication for 6 weeks we will postpone repeating echocardiogram and will do it probably in about 1-1/69-month. 2. Mixed dyslipidemia will continue with statin. 3. Type 2 diabetes apparently her hemoglobin A1c from 12 went down to 9 which is a progress but still plenty of room for improvement.   Medication Adjustments/Labs and Tests Ordered: Current medicines are reviewed at length with the patient today.  Concerns regarding medicines are outlined above.  No orders of the defined types were placed in this encounter.  Medication changes: No orders of the defined types were placed in this encounter.  Signed, Park Liter, MD, [facility] 10/04/2017 11:49 AM    [place]

## 2017-10-04 NOTE — Patient Instructions (Signed)
Medication Instructions:  Your physician recommends that you continue on your current medications as directed. Please refer to the Current Medication list given to you today. Please make sure you are taking all of your medications daily as directed.  Labwork: None ordered  Testing/Procedures: None ordered  Follow-Up: Your physician recommends that you schedule a follow-up appointment in: 6 weeks with Dr. Agustin Cree  Any Other Special Instructions Will Be Listed Below (If Applicable).     If you need a refill on your cardiac medications before your next appointment, please call your pharmacy.

## 2017-10-13 ENCOUNTER — Encounter (HOSPITAL_COMMUNITY): Payer: Self-pay | Admitting: Oncology

## 2017-10-13 ENCOUNTER — Observation Stay (HOSPITAL_BASED_OUTPATIENT_CLINIC_OR_DEPARTMENT_OTHER): Payer: Medicare Other

## 2017-10-13 ENCOUNTER — Emergency Department (HOSPITAL_COMMUNITY): Payer: Medicare Other

## 2017-10-13 ENCOUNTER — Observation Stay (HOSPITAL_COMMUNITY)
Admission: EM | Admit: 2017-10-13 | Discharge: 2017-10-14 | Disposition: A | Discharge status: Home / Self Care | Payer: Medicare Other | Attending: Internal Medicine | Admitting: Internal Medicine

## 2017-10-13 ENCOUNTER — Other Ambulatory Visit: Payer: Self-pay

## 2017-10-13 DIAGNOSIS — Z9114 Patient's other noncompliance with medication regimen: Secondary | ICD-10-CM | POA: Insufficient documentation

## 2017-10-13 DIAGNOSIS — E109 Type 1 diabetes mellitus without complications: Secondary | ICD-10-CM

## 2017-10-13 DIAGNOSIS — D649 Anemia, unspecified: Secondary | ICD-10-CM | POA: Insufficient documentation

## 2017-10-13 DIAGNOSIS — E1139 Type 2 diabetes mellitus with other diabetic ophthalmic complication: Secondary | ICD-10-CM

## 2017-10-13 DIAGNOSIS — E1169 Type 2 diabetes mellitus with other specified complication: Secondary | ICD-10-CM | POA: Diagnosis not present

## 2017-10-13 DIAGNOSIS — I420 Dilated cardiomyopathy: Secondary | ICD-10-CM | POA: Diagnosis not present

## 2017-10-13 DIAGNOSIS — I429 Cardiomyopathy, unspecified: Secondary | ICD-10-CM

## 2017-10-13 DIAGNOSIS — E782 Mixed hyperlipidemia: Secondary | ICD-10-CM

## 2017-10-13 DIAGNOSIS — E559 Vitamin D deficiency, unspecified: Secondary | ICD-10-CM | POA: Diagnosis not present

## 2017-10-13 DIAGNOSIS — D638 Anemia in other chronic diseases classified elsewhere: Secondary | ICD-10-CM | POA: Diagnosis present

## 2017-10-13 DIAGNOSIS — Z9119 Patient's noncompliance with other medical treatment and regimen: Secondary | ICD-10-CM | POA: Diagnosis not present

## 2017-10-13 DIAGNOSIS — I110 Hypertensive heart disease with heart failure: Secondary | ICD-10-CM | POA: Diagnosis not present

## 2017-10-13 DIAGNOSIS — Z8249 Family history of ischemic heart disease and other diseases of the circulatory system: Secondary | ICD-10-CM | POA: Insufficient documentation

## 2017-10-13 DIAGNOSIS — J810 Acute pulmonary edema: Secondary | ICD-10-CM

## 2017-10-13 DIAGNOSIS — J9601 Acute respiratory failure with hypoxia: Principal | ICD-10-CM | POA: Diagnosis present

## 2017-10-13 DIAGNOSIS — Z79899 Other long term (current) drug therapy: Secondary | ICD-10-CM | POA: Diagnosis not present

## 2017-10-13 DIAGNOSIS — R0600 Dyspnea, unspecified: Secondary | ICD-10-CM

## 2017-10-13 DIAGNOSIS — Z794 Long term (current) use of insulin: Secondary | ICD-10-CM | POA: Diagnosis not present

## 2017-10-13 DIAGNOSIS — E785 Hyperlipidemia, unspecified: Secondary | ICD-10-CM | POA: Diagnosis not present

## 2017-10-13 DIAGNOSIS — I361 Nonrheumatic tricuspid (valve) insufficiency: Secondary | ICD-10-CM | POA: Diagnosis not present

## 2017-10-13 DIAGNOSIS — I5021 Acute systolic (congestive) heart failure: Secondary | ICD-10-CM | POA: Diagnosis present

## 2017-10-13 DIAGNOSIS — R0602 Shortness of breath: Secondary | ICD-10-CM | POA: Diagnosis not present

## 2017-10-13 DIAGNOSIS — Z853 Personal history of malignant neoplasm of breast: Secondary | ICD-10-CM | POA: Diagnosis not present

## 2017-10-13 DIAGNOSIS — E039 Hypothyroidism, unspecified: Secondary | ICD-10-CM | POA: Diagnosis present

## 2017-10-13 DIAGNOSIS — E119 Type 2 diabetes mellitus without complications: Secondary | ICD-10-CM

## 2017-10-13 HISTORY — DX: Acute systolic (congestive) heart failure: I50.21

## 2017-10-13 HISTORY — DX: Hypothyroidism, unspecified: E03.9

## 2017-10-13 HISTORY — DX: Acute respiratory failure with hypoxia: J96.01

## 2017-10-13 HISTORY — DX: Anemia in other chronic diseases classified elsewhere: D63.8

## 2017-10-13 LAB — ECHOCARDIOGRAM COMPLETE
Ao-asc: 31 cm
Area-P 1/2: 4.31 cm2
E decel time: 173 msec
FS: 22 % — AB (ref 28–44)
HEIGHTINCHES: 62 in
IV/PV OW: 0.89
LA diam end sys: 42 mm
LA vol index: 42.7 mL/m2
LADIAMINDEX: 2.34 cm/m2
LASIZE: 42 mm
LAVOL: 76.7 mL
LAVOLA4C: 66.5 mL
LV PW d: 11.3 mm — AB (ref 0.6–1.1)
LVOT area: 3.46 cm2
LVOT diameter: 21 mm
MV Dec: 173
MV Peak grad: 6 mmHg
MVPKAVEL: 103 m/s
MVPKEVEL: 123 m/s
MVSPHT: 51 ms
P 1/2 time: 515 ms
Reg peak vel: 247 cm/s
TAPSE: 24.3 mm
TR max vel: 247 cm/s
WEIGHTICAEL: 2533 [oz_av]

## 2017-10-13 LAB — GLUCOSE, CAPILLARY
GLUCOSE-CAPILLARY: 95 mg/dL (ref 65–99)
Glucose-Capillary: 210 mg/dL — ABNORMAL HIGH (ref 65–99)
Glucose-Capillary: 106 mg/dL — ABNORMAL HIGH (ref 65–99)

## 2017-10-13 LAB — COMPREHENSIVE METABOLIC PANEL
ALBUMIN: 3.1 g/dL — AB (ref 3.5–5.0)
ALK PHOS: 45 U/L (ref 38–126)
ALT: 15 U/L (ref 14–54)
AST: 39 U/L (ref 15–41)
Anion gap: 7 (ref 5–15)
BILIRUBIN TOTAL: 0.4 mg/dL (ref 0.3–1.2)
BUN: 16 mg/dL (ref 6–20)
CALCIUM: 8.7 mg/dL — AB (ref 8.9–10.3)
CO2: 29 mmol/L (ref 22–32)
Chloride: 99 mmol/L — ABNORMAL LOW (ref 101–111)
Creatinine, Ser: 1.1 mg/dL — ABNORMAL HIGH (ref 0.44–1.00)
GFR calc Af Amer: 55 mL/min — ABNORMAL LOW (ref >60)
GFR calc non Af Amer: 48 mL/min — ABNORMAL LOW (ref >60)
GLUCOSE: 233 mg/dL — AB (ref 65–99)
Potassium: 4.2 mmol/L (ref 3.5–5.1)
SODIUM: 135 mmol/L (ref 135–145)
TOTAL PROTEIN: 5.9 g/dL — AB (ref 6.5–8.1)

## 2017-10-13 LAB — CBC WITH DIFFERENTIAL/PLATELET
Basophils Absolute: 0 10*3/uL (ref 0.0–0.1)
Basophils Relative: 0 %
Eosinophils Absolute: 0.2 10*3/uL (ref 0.0–0.7)
Eosinophils Relative: 3 %
HEMATOCRIT: 25.6 % — AB (ref 36.0–46.0)
HEMOGLOBIN: 8.4 g/dL — AB (ref 12.0–15.0)
LYMPHS ABS: 1.5 10*3/uL (ref 0.7–4.0)
LYMPHS PCT: 20 %
MCH: 29.3 pg (ref 26.0–34.0)
MCHC: 32.8 g/dL (ref 30.0–36.0)
MCV: 89.2 fL (ref 78.0–100.0)
MONOS PCT: 12 %
Monocytes Absolute: 0.9 10*3/uL (ref 0.1–1.0)
NEUTROS ABS: 4.9 10*3/uL (ref 1.7–7.7)
NEUTROS PCT: 65 %
Platelets: 374 10*3/uL (ref 150–400)
RBC: 2.87 MIL/uL — AB (ref 3.87–5.11)
RDW: 13.1 % (ref 11.5–15.5)
WBC: 7.4 10*3/uL (ref 4.0–10.5)

## 2017-10-13 LAB — BRAIN NATRIURETIC PEPTIDE: B Natriuretic Peptide: 332 pg/mL — ABNORMAL HIGH (ref 0.0–100.0)

## 2017-10-13 LAB — I-STAT TROPONIN, ED: Troponin i, poc: 0 ng/mL (ref 0.00–0.08)

## 2017-10-13 MED ORDER — FUROSEMIDE 10 MG/ML IJ SOLN
20.0000 mg | Freq: Three times a day (TID) | INTRAMUSCULAR | Status: DC
  Administered 2017-10-13 – 2017-10-14 (×4): 20 mg via INTRAVENOUS
  Filled 2017-10-13 (×4): qty 2

## 2017-10-13 MED ORDER — INSULIN ASPART 100 UNIT/ML ~~LOC~~ SOLN
0.0000 [IU] | Freq: Three times a day (TID) | SUBCUTANEOUS | Status: DC
  Administered 2017-10-13: 5 [IU] via SUBCUTANEOUS
  Administered 2017-10-14: 11 [IU] via SUBCUTANEOUS

## 2017-10-13 MED ORDER — SODIUM CHLORIDE 0.9% FLUSH
3.0000 mL | Freq: Two times a day (BID) | INTRAVENOUS | Status: DC
  Administered 2017-10-13 – 2017-10-14 (×2): 3 mL via INTRAVENOUS

## 2017-10-13 MED ORDER — ACETAMINOPHEN 325 MG PO TABS: 650.0000 mg | ORAL_TABLET | ORAL | Status: DC | PRN

## 2017-10-13 MED ORDER — INSULIN GLARGINE 100 UNIT/ML ~~LOC~~ SOLN
6.0000 [IU] | Freq: Two times a day (BID) | SUBCUTANEOUS | Status: DC
  Administered 2017-10-13 – 2017-10-14 (×2): 6 [IU] via SUBCUTANEOUS
  Filled 2017-10-13 (×3): qty 0.06

## 2017-10-13 MED ORDER — INSULIN LISPRO 100 UNIT/ML (KWIKPEN)
5.0000 [IU] | PEN_INJECTOR | Freq: Three times a day (TID) | SUBCUTANEOUS | Status: DC
  Filled 2017-10-13: qty 3

## 2017-10-13 MED ORDER — LEVOTHYROXINE SODIUM 112 MCG PO TABS
112.0000 ug | ORAL_TABLET | Freq: Every day | ORAL | Status: DC
  Filled 2017-10-13: qty 1

## 2017-10-13 MED ORDER — INSULIN GLARGINE 100 UNITS/ML SOLOSTAR PEN
6.0000 [IU] | PEN_INJECTOR | Freq: Two times a day (BID) | SUBCUTANEOUS | Status: DC
  Filled 2017-10-13: qty 3

## 2017-10-13 MED ORDER — CARVEDILOL 12.5 MG PO TABS: 6.2500 mg | ORAL_TABLET | Freq: Two times a day (BID) | ORAL | Status: DC

## 2017-10-13 MED ORDER — ATORVASTATIN CALCIUM 10 MG PO TABS
10.0000 mg | ORAL_TABLET | Freq: Every day | ORAL | Status: DC
  Administered 2017-10-13 – 2017-10-14 (×2): 10 mg via ORAL
  Filled 2017-10-13 (×2): qty 1

## 2017-10-13 MED ORDER — ONDANSETRON HCL 4 MG/2ML IJ SOLN: 4.0000 mg | Freq: Four times a day (QID) | INTRAMUSCULAR | Status: DC | PRN

## 2017-10-13 MED ORDER — FUROSEMIDE 10 MG/ML IJ SOLN
20.0000 mg | Freq: Once | INTRAMUSCULAR | Status: AC
  Administered 2017-10-13: 20 mg via INTRAVENOUS
  Filled 2017-10-13: qty 2

## 2017-10-13 MED ORDER — LOSARTAN POTASSIUM 50 MG PO TABS
100.0000 mg | ORAL_TABLET | Freq: Every day | ORAL | Status: DC
  Administered 2017-10-13 – 2017-10-14 (×2): 100 mg via ORAL
  Filled 2017-10-13 (×3): qty 2

## 2017-10-13 MED ORDER — INSULIN ASPART 100 UNIT/ML ~~LOC~~ SOLN: 0.0000 [IU] | Freq: Every day | SUBCUTANEOUS | Status: DC

## 2017-10-13 MED ORDER — FAMOTIDINE 20 MG PO TABS
20.0000 mg | ORAL_TABLET | Freq: Two times a day (BID) | ORAL | Status: DC
  Administered 2017-10-13 – 2017-10-14 (×3): 20 mg via ORAL
  Filled 2017-10-13 (×3): qty 1

## 2017-10-13 MED ORDER — INSULIN ASPART 100 UNIT/ML ~~LOC~~ SOLN: 5.0000 [IU] | Freq: Three times a day (TID) | SUBCUTANEOUS | Status: DC

## 2017-10-13 MED ORDER — FENOFIBRATE 160 MG PO TABS
160.0000 mg | ORAL_TABLET | Freq: Every day | ORAL | Status: DC
  Administered 2017-10-13 – 2017-10-14 (×2): 160 mg via ORAL
  Filled 2017-10-13 (×2): qty 1

## 2017-10-13 MED ORDER — CARVEDILOL 6.25 MG PO TABS
6.2500 mg | ORAL_TABLET | Freq: Two times a day (BID) | ORAL | Status: DC
  Administered 2017-10-13 – 2017-10-14 (×3): 6.25 mg via ORAL
  Filled 2017-10-13 (×3): qty 1

## 2017-10-13 MED ORDER — SODIUM CHLORIDE 0.9 % IV SOLN: 250.0000 mL | INTRAVENOUS | Status: DC | PRN

## 2017-10-13 MED ORDER — SODIUM CHLORIDE 0.9% FLUSH: 3.0000 mL | INTRAVENOUS | Status: DC | PRN

## 2017-10-13 MED ORDER — ENOXAPARIN SODIUM 40 MG/0.4ML ~~LOC~~ SOLN
40.0000 mg | SUBCUTANEOUS | Status: DC
  Administered 2017-10-13 – 2017-10-14 (×2): 40 mg via SUBCUTANEOUS
  Filled 2017-10-13 (×2): qty 0.4

## 2017-10-13 NOTE — Plan of Care (Signed)
Patients shortness of breath has almost completely resolved after receiving IV lasix.  This RN educated patient and family regarding heart failure including reviewing living with heart failure booklet, answered many of granddaughters questions regarding heart failure and medications to control heart failure and reduce hospital admissions.  Patient with oxygen saturation in the high 90's to 100 on room air.

## 2017-10-13 NOTE — Progress Notes (Signed)
Patient admitted to [place]  from emergency room, VSS, in no distress.  Patient states she feels tremendously improved since receiving Lasix in the Emergency Room.  Maintaining oxygen saturation in the high 90's on room air.  Sister at bedside.

## 2017-10-13 NOTE — Progress Notes (Signed)
  Echocardiogram 2D Echocardiogram has been performed.  Iris Hairston G Corrigan Kretschmer 10/13/2017, 3:05 PM

## 2017-10-13 NOTE — ED Notes (Signed)
Pt reports that she is feeling much better.  Pt is able to speak in full sentences at this time.

## 2017-10-13 NOTE — H&P (Addendum)
History and Physical  Elizabeth Olsen VOH:[id number] DOB: 04-10-1941 DOA: 10/13/2017  Referring physician: Margarita Mail, PA-C, ED provider PCP: [name], MD  Outpatient Specialists:  Dr Agustin Cree (Cardiology)  Patient Coming From: home  Chief Complaint: Acute shortness of breath  HPI: Elizabeth Olsen is a 76 y.o. female with a history of anemia, history of breast cancer status post lumpectomy, dilated cardiomyopathy, diabetes type 2, systolic heart failure with an EF of 45-50% and diffuse hypokinesis on echocardiogram 07/18/2017.  Patient seen for acute shortness of breath this morning, which woke her up.  When EMS arrived, she was hypoxic to approximately 69% with a systolic blood pressure of 200.  She was given 4 doses of sublingual nitroglycerin, which improved her blood pressure to 164/94.  She was placed on 4 L nasal cannula with improvement of her oxygen saturation.  No other palliating or provoking factors.  Her breathing is not improving and she is currently on room air.  In conversing with the patient, as well as her last office visit with her cardiologist on 12/6, the patient has been noncompliant with her carvedilol as it "makes her feel funny".  In pressing patient, she gets short of breath with exertion when she is taking the carvedilol, likely produced by the beta-blockade.  Emergency Department Course: Patient's oxygenation has improved and she is resting comfortably on room air.  Chest x-ray shows moderate pulmonary edema in the lower lobes.  BNP 332.  She was given 20 mg of IV Lasix as she is Lasix nave.  Review of Systems:   Pt denies any fevers, chills, nausea, vomiting, diarrhea, constipation, abdominal pain, orthopnea, cough, wheezing, palpitations, headache, vision changes, lightheadedness, dizziness, melena, rectal bleeding.  Review of systems are otherwise negative  Past Medical History:  Diagnosis Date  . Anemia   . Blood transfusion   . Blood transfusion  without reported diagnosis    with breast surgery  . Breast cancer ([place]) 15 years ago   left   . 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 Diabetes mellitus   . Heart murmur    as child  . Hernia, incisional   . Hyperlipidemia   . Hypertension   . Hypothyroidism   . Vitamin D deficiency    Past Surgical History:  Procedure Laterality Date  . ABDOMINAL HYSTERECTOMY    . APPENDECTOMY    . COLONOSCOPY  2013   due next 03-2017  . IUD REMOVAL     with appendectomy  . MASTECTOMY, MODIFIED RADICAL W/RECONSTRUCTION Left 15 years ago   10 nodes out  . TUMOR EXCISION Left    x 2, neck, head   Social History:  reports that  has never smoked. she has never used smokeless tobacco. She reports that she does not drink alcohol or use drugs. Patient lives at home  Allergies  Allergen Reactions  . Erythromycin Swelling    "throat swelling", sob, thrashing ,   . Penicillins Rash  . Nyquil [Pseudoeph-Doxylamine-Dm-Apap] Rash  . Sulfa Antibiotics Rash    Family History  Problem Relation Age of Onset  . Diabetes Other        both sides of family  . Hypertension Father   . Congestive Heart Failure Father   . Peripheral vascular disease Father   . Hypertension Maternal Grandfather   . Hypertension Maternal Grandmother   . Stomach cancer Maternal Grandmother        GGM  . Heart attack Brother   . Colon cancer Neg  Hx   . Esophageal cancer Neg Hx   . Pancreatic cancer Neg Hx   . Rectal cancer Neg Hx       Prior to Admission medications   Medication Sig Start Date End Date Taking? Authorizing Provider  atorvastatin (LIPITOR) 10 MG tablet Take 1 tablet (10 mg total) by mouth daily. 07/25/17   Park Liter, MD  calcium carbonate (OSCAL) 1500 (600 Ca) MG TABS tablet Take by mouth daily with breakfast.    [provider]  carvedilol (COREG) 6.25 MG tablet Take 1 tablet (6.25 mg total) by mouth 2 (two) times daily. 08/10/17 11/08/17  Park Liter, MD  fenofibrate 160 MG  tablet Take 160 mg by mouth daily.    [provider]  fish oil-omega-3 fatty acids 1000 MG capsule Take 2 g by mouth daily.     [provider]  HUMALOG KWIKPEN 100 UNIT/ML KiwkPen INJECT 3 UNITS WITH MEALS PLUS SLIDING SCALE (ISF 50, TARGET 100). 30 UNITS DAILY 05/17/15   [provider]  iron polysaccharides (NIFEREX) 150 MG capsule Take 150 mg by mouth daily.    [provider]  LANTUS SOLOSTAR 100 UNIT/ML Solostar Pen Inject 12 units in the morning and 6 units in the evening 05/17/15   [provider]  levothyroxine (SYNTHROID, LEVOTHROID) 112 MCG tablet Take 112 mcg by mouth daily before breakfast.    [provider]  losartan (COZAAR) 100 MG tablet Take 100 mg by mouth daily.    [provider]  Multiple Vitamins-Minerals (MULTIVITAMIN WITH MINERALS) tablet Take 1 tablet by mouth daily.    [provider]  ONETOUCH VERIO test strip USE TO TEST UP TO 10 TIMES D 08/24/17   [provider]  ranitidine (ZANTAC) 300 MG capsule Take 300 mg by mouth every evening.    [provider]  Vitamin D, Ergocalciferol, (DRISDOL) 50000 UNITS CAPS Take 50,000 Units by mouth 2 (two) times a week.  02/20/12   [provider]    Physical Exam: BP (!) 171/95   Pulse 92   Temp 97.7 F (36.5 C) (Oral)   Resp (!) 25   Ht 5\' 2"  (1.575 m)   Wt 72.6 kg (160 lb)   SpO2 97%   BMI 29.26 kg/m   General: Elderly woman. Awake and alert and oriented x3. No acute cardiopulmonary distress.  HEENT: Normocephalic atraumatic.  Right and left ears normal in appearance.  Pupils equal, round, reactive to light. Extraocular muscles are intact. Sclerae anicteric and noninjected.  Moist mucosal membranes. No mucosal lesions.  Neck: Neck supple without lymphadenopathy. No carotid bruits. No masses palpated.  Cardiovascular: Regular rate with normal S1-S2 sounds. No murmurs, rubs, gallops auscultated.  JVD to 8 cm.  Respiratory: Good  respiratory effort.  Rales in the bases bilaterally. No accessory muscle use. Abdomen: Soft, nontender, nondistended. Active bowel sounds. No masses or hepatosplenomegaly  Skin: No rashes, lesions, or ulcerations.  Dry, warm to touch. 2+ dorsalis pedis and radial pulses. Musculoskeletal: No calf or leg pain. All major joints not erythematous nontender.  No upper or lower joint deformation.  Good ROM.  No contractures  Psychiatric: Intact judgment and insight. Pleasant and cooperative. Neurologic: No focal neurological deficits. Strength is 5/5 and symmetric in upper and lower extremities.  Cranial nerves II through XII are grossly intact.           Labs on Admission: I have personally reviewed following labs and imaging studies  CBC: Recent Labs  Lab 10/13/17 0640  WBC 7.4  NEUTROABS 4.9  HGB 8.4*  HCT 25.6*  MCV 89.2  PLT 333   Basic Metabolic Panel: Recent Labs  Lab 10/13/17 0640  NA 135  K 4.2  CL 99*  CO2 29  GLUCOSE 233*  BUN 16  CREATININE 1.10*  CALCIUM 8.7*   GFR: Estimated Creatinine Clearance: 41.2 mL/min (A) (by C-G formula based on SCr of 1.1 mg/dL (H)). Liver Function Tests: Recent Labs  Lab 10/13/17 0640  AST 39  ALT 15  ALKPHOS 45  BILITOT 0.4  PROT 5.9*  ALBUMIN 3.1*   No results for input(s): LIPASE, AMYLASE in the last 168 hours. No results for input(s): AMMONIA in the last 168 hours. Coagulation Profile: No results for input(s): INR, PROTIME in the last 168 hours. Cardiac Enzymes: No results for input(s): CKTOTAL, CKMB, CKMBINDEX, TROPONINI in the last 168 hours. BNP (last 3 results) No results for input(s): PROBNP in the last 8760 hours. HbA1C: No results for input(s): HGBA1C in the last 72 hours. CBG: No results for input(s): GLUCAP in the last 168 hours. Lipid Profile: No results for input(s): CHOL, HDL, LDLCALC, TRIG, CHOLHDL, LDLDIRECT in the last 72 hours. Thyroid Function Tests: No results for input(s): TSH, T4TOTAL, FREET4,  T3FREE, THYROIDAB in the last 72 hours. Anemia Panel: No results for input(s): VITAMINB12, FOLATE, FERRITIN, TIBC, IRON, RETICCTPCT in the last 72 hours. Urine analysis:    Component Value Date/Time   COLORURINE YELLOW 04/12/2013 1421   APPEARANCEUR CLEAR 04/12/2013 1421   LABSPEC 1.026 04/12/2013 1421   PHURINE 7.0 04/12/2013 1421   GLUCOSEU >1000 (A) 04/12/2013 1421   HGBUR NEGATIVE 04/12/2013 1421   BILIRUBINUR NEGATIVE 04/12/2013 1421   KETONESUR NEGATIVE 04/12/2013 1421   PROTEINUR NEGATIVE 04/12/2013 1421   UROBILINOGEN 0.2 04/12/2013 1421   NITRITE NEGATIVE 04/12/2013 1421   LEUKOCYTESUR NEGATIVE 04/12/2013 1421   Sepsis Labs: @LABRCNTIP (procalcitonin:4,lacticidven:4) )No results found for this or any previous visit (from the past 240 hour(s)).   Radiological Exams on Admission: Dg Chest 2 View  Result Date: 10/13/2017 CLINICAL DATA:  Shortness of Breath EXAM: CHEST  2 VIEW COMPARISON:  06/19/2017 FINDINGS: Cardiac shadow is mildly enlarged but stable. The lungs are well aerated bilaterally. Mild central vascular congestion is noted. Patchy infiltrate is noted in the medial right lung base. No other focal abnormality is seen. IMPRESSION: Mild vascular congestion. Patchy infiltrate in the right lung base. Electronically Signed   By: Inez Catalina M.D.   On: 10/13/2017 07:36    EKG: Independently reviewed.  Sinus rhythm with ventricular rate of 92.  No acute ST elevation or depression.  Assessment/Plan: Principal Problem:   Acute respiratory failure with hypoxia (HCC) Active Problems:   Diabetes mellitus ([place])   Hyperlipidemia   Cardiomyopathy ([place])   Acute systolic heart failure (HCC)   Hypothyroid   Anemia associated with diabetes mellitus ([place])    This patient was discussed with the ED physician, including pertinent vitals, physical exam findings, labs, and imaging.  We also discussed care given by the ED provider.  1.  Acute respiratory failure with  hypoxia  Currently compensated and stable on room air 2.  Acute systolic heart failure Telemetry monitoring Strict I/O Daily Weights Diuresis: Lasix 20 mg 3 times daily Potassium: 20 mEq twice a day by mouth Echo cardiac exam tomorrow Repeat BMP tomorrow Continue carvedilol and losartan 3.  Diabetes.  Home insulin regimen with sliding scales  CBGs before meals and nightly  Check hemoglobin A1c  4.  Hypothyroidism  Synthroid at home dose 5.  Cardiomyopathy  Continue carvedilol 6.  Anemia 7.  Hyperlipidemia  Continue statin  DVT prophylaxis: Lovenox Consultants: None Code Status: Full code Family Communication: Granddaughter in the room Disposition Plan: Patient to return home following hospital stay   Truett Mainland, DO [facility] Pager [phone]  If 7PM-7AM, please contact night-coverage [email or web address] Password [email or web address]

## 2017-10-13 NOTE — ED Triage Notes (Signed)
Pt bib [facility] from home d/t shob.  Per EMS, upon waking pt attempted to ambulate to the bathroom however was too shob.  Denies hx of CHF.  Per EMS, pt's initial O2 sat was in the 80's.  Pt placed on 4L O2 via [place] w/ improvement in O2 sats.  Pt was hypertensive in the field at 220/160, given nitro 0.4 mg x 5.  BP 164/94.  EMS reports crackles to all lung fields.

## 2017-10-13 NOTE — ED Provider Notes (Signed)
[facility] EMERGENCY DEPARTMENT Provider Note   CSN: [id number] Arrival date & time: 10/13/17  3086     History   Chief Complaint Chief Complaint  Patient presents with  . Shortness of Breath    HPI  Elizabeth Olsen is a 76 y.o. female Who presents for SOB. She has a pmh of DM, Hypertension and cardiomyopathy. She is followed by Dr. Drue Dun in cardiology. The patient states that she awoke around 4:30 AM gasping for air. She had to walk around and sit upright. She tried kept it to herself for about 10 minutes because eshe did not want to bother her husband but finally asked him to call 911. She has had similar episodes in the past, but states that "none has been as bad as this!" EMS found the patient to have BP of 220/160 and oxygen saturations in the 80s. She was placed on 02 and given nitroglycerine with significant improvement in breathing. She did not receive ASA. She denies CP. She  Feels back to basline. She does not have a Dx of CHF. She had an echocardiogram in September of this year that showed diffuse hypokinesis of the left ventricle with some dilation of the ventricle. There was some mild diastolic dysfunction, but she had an EF of 45-50%.  Patient was placed on 2 medications by Dr. Agustin Cree, 1 of which is carvedilol.  She has been noncompliant with the medication for 2 reasons.  One her daughter does not want her taking so much medication and has tried to find natural alternative, and to the patient said that it was making her feel terrible.  She denies any weight gain.  She has chronic mild peripheral edema.  HPI  Past Medical History:  Diagnosis Date  . Anemia   . Blood transfusion   . Blood transfusion without reported diagnosis    with breast surgery  . Breast cancer ([place]) 15 years ago   left   . 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 Diabetes mellitus   . Heart murmur    as child  . Hernia, incisional   . Hyperlipidemia   . Hypertension   .  Hypothyroidism   . Vitamin D deficiency     Patient Active Problem List   Diagnosis Date Noted  . Diabetes mellitus ([place]) 07/23/2017  . Hyperlipidemia 07/23/2017  . Iron deficiency 07/23/2017  . Cardiomyopathy ([place]) 07/23/2017    Past Surgical History:  Procedure Laterality Date  . ABDOMINAL HYSTERECTOMY    . APPENDECTOMY    . COLONOSCOPY  2013   due next 03-2017  . IUD REMOVAL     with appendectomy  . MASTECTOMY, MODIFIED RADICAL W/RECONSTRUCTION Left 15 years ago   10 nodes out  . TUMOR EXCISION Left    x 2, neck, head    OB History    No data available       Home Medications    Prior to Admission medications   Medication Sig Start Date End Date Taking? Authorizing Provider  atorvastatin (LIPITOR) 10 MG tablet Take 1 tablet (10 mg total) by mouth daily. 07/25/17   Park Liter, MD  calcium carbonate (OSCAL) 1500 (600 Ca) MG TABS tablet Take by mouth daily with breakfast.    [provider]  carvedilol (COREG) 6.25 MG tablet Take 1 tablet (6.25 mg total) by mouth 2 (two) times daily. 08/10/17 11/08/17  Park Liter, MD  fenofibrate 160 MG tablet Take 160 mg by mouth daily.  [provider]  fish oil-omega-3 fatty acids 1000 MG capsule Take 2 g by mouth daily.     [provider]  HUMALOG KWIKPEN 100 UNIT/ML KiwkPen INJECT 3 UNITS WITH MEALS PLUS SLIDING SCALE (ISF 50, TARGET 100). 30 UNITS DAILY 05/17/15   [provider]  iron polysaccharides (NIFEREX) 150 MG capsule Take 150 mg by mouth daily.    [provider]  LANTUS SOLOSTAR 100 UNIT/ML Solostar Pen Inject 12 units in the morning and 6 units in the evening 05/17/15   [provider]  levothyroxine (SYNTHROID, LEVOTHROID) 112 MCG tablet Take 112 mcg by mouth daily before breakfast.    [provider]  losartan (COZAAR) 100 MG tablet Take 100 mg by mouth daily.    [provider]  Multiple Vitamins-Minerals (MULTIVITAMIN WITH  MINERALS) tablet Take 1 tablet by mouth daily.    [provider]  ONETOUCH VERIO test strip USE TO TEST UP TO 10 TIMES D 08/24/17   [provider]  ranitidine (ZANTAC) 300 MG capsule Take 300 mg by mouth every evening.    [provider]  Vitamin D, Ergocalciferol, (DRISDOL) 50000 UNITS CAPS Take 50,000 Units by mouth 2 (two) times a week.  02/20/12   [provider]    Family History Family History  Problem Relation Age of Onset  . Diabetes Other        both sides of family  . Hypertension Father   . Congestive Heart Failure Father   . Peripheral vascular disease Father   . Hypertension Maternal Grandfather   . Hypertension Maternal Grandmother   . Stomach cancer Maternal Grandmother        GGM  . Heart attack Brother   . Colon cancer Neg Hx   . Esophageal cancer Neg Hx   . Pancreatic cancer Neg Hx   . Rectal cancer Neg Hx     Social History Social History   Tobacco Use  . Smoking status: Never Smoker  . Smokeless tobacco: Never Used  Substance Use Topics  . Alcohol use: No  . Drug use: No     Allergies   Erythromycin; Penicillins; Nyquil [pseudoeph-doxylamine-dm-apap]; and Sulfa antibiotics   Review of Systems Review of Systems Ten systems reviewed and are negative for acute change, except as noted in the HPI.    Physical Exam Updated Vital Signs BP (!) 166/79   Pulse 89   Temp 97.7 F (36.5 C) (Oral)   Resp (!) 22   Ht 5\' 2"  (1.575 m)   Wt 72.6 kg (160 lb)   SpO2 97%   BMI 29.26 kg/m   Physical Exam  Constitutional: She is oriented to person, place, and time. She appears well-developed and well-nourished. No distress.  HENT:  Head: Normocephalic and atraumatic.  Eyes: Conjunctivae are normal. No scleral icterus.  Neck: Normal range of motion.  Cardiovascular: Normal rate, regular rhythm and normal heart sounds. Exam reveals no gallop and no friction rub.  No murmur heard. Pulmonary/Chest: Effort normal. No  respiratory distress. She has rales in the right middle field, the right lower field, the left middle field and the left lower field.  Abdominal: Soft. Bowel sounds are normal. She exhibits no distension and no mass. There is no tenderness. There is no guarding.  Musculoskeletal:       Right lower leg: She exhibits edema.       Left lower leg: She exhibits edema.  Neurological: She is alert and oriented to person, place, and  time.  Skin: Skin is warm and dry. She is not diaphoretic.  Psychiatric: Her behavior is normal.  Nursing note and vitals reviewed.    ED Treatments / Results  Labs (all labs ordered are listed, but only abnormal results are displayed) Labs Reviewed  CBC WITH DIFFERENTIAL/PLATELET - Abnormal; Notable for the following components:      Result Value   RBC 2.87 (*)    Hemoglobin 8.4 (*)    HCT 25.6 (*)    All other components within normal limits  BRAIN NATRIURETIC PEPTIDE  COMPREHENSIVE METABOLIC PANEL  I-STAT TROPONIN, ED    EKG  EKG Interpretation  Date/Time:  Saturday October 13 2017 06:03:37 EST Ventricular Rate:  92 PR Interval:    QRS Duration: 102 QT Interval:  380 QTC Calculation: 471 R Axis:   72 Text Interpretation:  Sinus rhythm Normal ECG Confirmed by Veryl Speak [phone]) on 10/13/2017 6:41:48 AM       Radiology No results found.  Procedures Procedures (including critical care time)  Medications Ordered in ED Medications - No data to display   Initial Impression / Assessment and Plan / ED Course  I have reviewed the triage vital signs and the nursing notes.  Pertinent labs & imaging results that were available during my care of the patient were reviewed by me and considered in my medical decision making (see chart for details).  Clinical Course as of Oct 13 750  Sat Oct 13, 2017  7322 Hemoglobin: (!) 8.4 [AH]  0254 Patient saw Dr. Agustin Cree on 12 6.  Because she had not been taking her medications in 6 weeks he wanted her  to continue with her appropriate medicines for about a month to month and a half and repeat her echocardiogram.  Patient appears to have pulmonary edema on her chest x-ray.  Although she has a patchy infiltrate I do not feel that this represents pneumonia and is fluid as the patient is not complaining of cough, or upper respiratory symptoms other than her shortness of breath.  She has bilateral crackles from the middle lungs down to the bases.  And her physical exam also shows bilateral 1+ pitting edema in the lower extremities.  Patient does not appear to be on any diuretics.  I have placed her on 20 mg of IV Lasix.  Feel she would need admission.  Patient will definitely need encouragement to maintain compliance with her medications.  I had a long discussion with her about her previous echocardiogram and what her diagnosis of dilated cardiomyopathy meant.  I also discussed why she was placed on a beta-blocker to increase decrease her inotropic function and that she would eventually feel improved after she became used to it.  [AH]    Clinical Course User Index [AH] Margarita Mail, PA-C    Patient will be admitted for Pulmonary edema. Improved during visit.  Final Clinical Impressions(s) / ED Diagnoses   Final diagnoses:  Acute pulmonary edema ([place])  PND (paroxysmal nocturnal dyspnea)    ED Discharge Orders    None       Margarita Mail, PA-C 10/13/17 1638    Malvin Johns, MD 10/14/17 [phone]

## 2017-10-14 DIAGNOSIS — J9601 Acute respiratory failure with hypoxia: Secondary | ICD-10-CM

## 2017-10-14 LAB — BASIC METABOLIC PANEL
ANION GAP: 7 (ref 5–15)
BUN: 18 mg/dL (ref 6–20)
CALCIUM: 8.7 mg/dL — AB (ref 8.9–10.3)
CHLORIDE: 100 mmol/L — AB (ref 101–111)
CO2: 30 mmol/L (ref 22–32)
Creatinine, Ser: 1.49 mg/dL — ABNORMAL HIGH (ref 0.44–1.00)
GFR calc non Af Amer: 33 mL/min — ABNORMAL LOW (ref >60)
GFR, EST AFRICAN AMERICAN: 38 mL/min — AB (ref >60)
Glucose, Bld: 158 mg/dL — ABNORMAL HIGH (ref 65–99)
Potassium: 3.7 mmol/L (ref 3.5–5.1)
Sodium: 137 mmol/L (ref 135–145)

## 2017-10-14 LAB — GLUCOSE, CAPILLARY
GLUCOSE-CAPILLARY: 127 mg/dL — AB (ref 65–99)
GLUCOSE-CAPILLARY: 162 mg/dL — AB (ref 65–99)
Glucose-Capillary: 342 mg/dL — ABNORMAL HIGH (ref 65–99)
Glucose-Capillary: 142 mg/dL — ABNORMAL HIGH (ref 65–99)

## 2017-10-14 LAB — TSH: TSH: 0.98 u[IU]/mL (ref 0.350–4.500)

## 2017-10-14 MED ORDER — FUROSEMIDE 40 MG PO TABS: 40.0000 mg | ORAL_TABLET | Freq: Every day | ORAL | 11 refills | Status: DC

## 2017-10-14 NOTE — Plan of Care (Signed)
Discharge instructions reviewed with patient and daughters, questions answered, verbalized understanding.  Living with heart failure booklet sent home with patient and family.  Patient being transported home by granddaughter.

## 2017-10-14 NOTE — Progress Notes (Signed)
Daughter in room with lots of questions regarding patients CHF and how she can stay out of the hospital.  Martin Majestic over CHF booklet again with her and patient.  Discussed the main ways of managing CHF and avoiding readmissions is to take medications as prescribed (patient was not taking her Coreg and had apparently refused Lasix prior), daily weights which patient was not doing, avoiding sodium which patient was not doing.  Patient says lemon juice will also help her diurese and seems to be more in favor of natural remedies over prescribed medications.

## 2017-10-14 NOTE — Discharge Summary (Signed)
Physician Discharge Summary  Patient ID: Elizabeth Olsen MRN: [id number] DOB/AGE: [birth year] 75 y.o.  Admit date: 10/13/2017 Discharge date: 10/14/2017  Admission Diagnoses:  Discharge Diagnoses:  Principal Problem:   Acute respiratory failure with hypoxia ([place]) Active Problems:   Diabetes mellitus ([place])   Hyperlipidemia   Cardiomyopathy ([place])   Acute systolic heart failure (HCC)   Hypothyroid   Anemia associated with diabetes mellitus ([place])   Discharged Condition: stable  Hospital Course: Patient is a 76 year old female with a history of anemia, history of breast cancer status post lumpectomy, dilated cardiomyopathy, diabetes type 2, systolic heart failure with an EF of 45-50% and diffuse hypokinesis on echocardiogram 07/18/2017. Patient has not been compliant with diuretics and coreg. Patient was admitted with exacerbation of systolic congestive heart failure. Impaired renal function was also noted. Patient has chronic anemia, with component of iron deficiency. Patient was admitted and treated with IV lasix and coreg, and other medications were continues. Patient is back to her baseline and is eager to be discharged back home today. There will be need for the PCP to monitor the renal function, and manage accordingly, including necessary referrals. Patient will follow up with the Cardiology team.   Consults: None  Significant Diagnostic Studies: Monitor Renal function and Hemoglobin.  Discharge Exam: Blood pressure (!) 140/59, pulse 73, temperature 98 F (36.7 C), temperature source Oral, resp. rate 18, height 5\' 2"  (1.575 m), weight 70.9 kg (156 lb 4.8 oz), SpO2 97 %.   Disposition: 01-Home or Self Care  Discharge Instructions    Activity as tolerated - No restrictions   Complete by:  As directed    Call MD for:   Complete by:  As directed    Call MD if symptoms worsen.   Diet - low sodium heart healthy   Complete by:  As directed    Discharge instructions   Complete by:   As directed    Check BMP, mg and phos at your PCP's office in 3 days     Allergies as of 10/14/2017      Reactions   Erythromycin Swelling   "throat swelling", sob, thrashing ,    Penicillins Rash   Nyquil [pseudoeph-doxylamine-dm-apap] Rash   Sulfa Antibiotics Rash      Medication List    STOP taking these medications   APPLE CIDER VINEGAR PO   Black Currant Seed Oil 500 MG Caps     TAKE these medications   ascorbic acid 500 MG/5ML syrup Commonly known as:  VITAMIN C Take 500 mg by mouth daily.   atorvastatin 10 MG tablet Commonly known as:  LIPITOR Take 1 tablet (10 mg total) by mouth daily.   calcium carbonate 1500 (600 Ca) MG Tabs tablet Commonly known as:  OSCAL Take by mouth daily with breakfast.   carvedilol 6.25 MG tablet Commonly known as:  COREG Take 1 tablet (6.25 mg total) by mouth 2 (two) times daily.   fenofibrate 160 MG tablet Take 160 mg by mouth daily.   fish oil-omega-3 fatty acids 1000 MG capsule Take 2 g by mouth daily.   furosemide 40 MG tablet Commonly known as:  LASIX Take 1 tablet (40 mg total) by mouth daily.   HUMALOG KWIKPEN 100 UNIT/ML KiwkPen Generic drug:  insulin lispro INJECT 5  UNITS WITH MEALS PLUS SLIDING SCALE (ISF 50, TARGET 100). 30 UNITS DAILY   iron polysaccharides 150 MG capsule Commonly known as:  NIFEREX Take 150 mg by mouth 2 (two) times daily.  LANTUS SOLOSTAR 100 UNIT/ML Solostar Pen Generic drug:  Insulin Glargine Inject 6 units in the morning and 6 units in the evening   levothyroxine 112 MCG tablet Commonly known as:  SYNTHROID, LEVOTHROID Take 112 mcg by mouth daily before breakfast.   losartan 100 MG tablet Commonly known as:  COZAAR Take 100 mg by mouth See admin instructions. Patient taking one-half tablet (50mg ) daily   multivitamin with minerals tablet Take 1 tablet by mouth daily.   MUSCLE RUB 10-15 % Crea Apply 1 application topically as needed for muscle pain.   ONETOUCH VERIO test  strip Generic drug:  glucose blood USE TO TEST UP TO 10 TIMES D   OVER THE COUNTER MEDICATION Take 5 mLs by mouth daily.   ranitidine 300 MG capsule Commonly known as:  ZANTAC Take 300 mg by mouth 2 (two) times daily.   Vitamin D (Ergocalciferol) 50000 units Caps capsule Commonly known as:  DRISDOL Take 50,000 Units by mouth 2 (two) times a week.        SignedBonnell Public 10/14/2017, 1:22 PM

## 2017-10-15 ENCOUNTER — Telehealth: Payer: Self-pay | Admitting: Cardiology

## 2017-10-15 NOTE — Telephone Encounter (Signed)
Patient scheduled for tomorrow afternoon at 3 pm.

## 2017-10-15 NOTE — Telephone Encounter (Signed)
Elizabeth Olsen released from hospital yesterday. In hospital for shortness of breath. She is not scheduled until January, should she make an appt. for sooner?

## 2017-10-16 ENCOUNTER — Encounter: Payer: Self-pay | Admitting: Cardiology

## 2017-10-16 ENCOUNTER — Ambulatory Visit (INDEPENDENT_AMBULATORY_CARE_PROVIDER_SITE_OTHER): Payer: Medicare Other | Admitting: Cardiology

## 2017-10-16 VITALS — BP 120/56 | HR 70 | Ht 62.5 in | Wt 159.0 lb

## 2017-10-16 DIAGNOSIS — D638 Anemia in other chronic diseases classified elsewhere: Secondary | ICD-10-CM | POA: Diagnosis not present

## 2017-10-16 DIAGNOSIS — Z794 Long term (current) use of insulin: Secondary | ICD-10-CM | POA: Diagnosis not present

## 2017-10-16 DIAGNOSIS — E1169 Type 2 diabetes mellitus with other specified complication: Secondary | ICD-10-CM | POA: Diagnosis not present

## 2017-10-16 DIAGNOSIS — E1139 Type 2 diabetes mellitus with other diabetic ophthalmic complication: Secondary | ICD-10-CM | POA: Diagnosis not present

## 2017-10-16 DIAGNOSIS — E782 Mixed hyperlipidemia: Secondary | ICD-10-CM

## 2017-10-16 DIAGNOSIS — I420 Dilated cardiomyopathy: Secondary | ICD-10-CM | POA: Diagnosis not present

## 2017-10-16 LAB — HEMOGLOBIN A1C
HEMOGLOBIN A1C: 9.5 % — AB (ref 4.8–5.6)
Mean Plasma Glucose: 226 mg/dL

## 2017-10-16 NOTE — Progress Notes (Signed)
Cardiology Office Note:    Date:  10/16/2017   ID:  Elizabeth Olsen, DOB 1941/06/19, MRN [id number]  PCP:  [name], MD  Cardiologist:  [name], MD    Referring MD: [name], MD   Chief Complaint  Patient presents with  . Hospitalization Follow-up  Doing better  History of Present Illness:    Elizabeth Olsen is a 76 y.o. female with cardiomyopathy with mildly diminished left ventricular ejection fraction.  Recently she ended up going to the hospital because of decompensated congestive heart failure.  Probably contributing factor was the fact that she did not take all her medication the way she should.  She was diuresed while in the hospital got better and discharged home.  Now doing better denies having any incremental shortness of breath some shortness of breath with exertion, but is still some minimal swelling of lower extremities.  There was a lot of misunderstanding after being in the hospital one person told her she need to drink more another person told her she does not need to be doing a lot and one person told her she need to bring her legs up as somebody has told her now she need to keep them down quite a confusing story I try to clarify all those issues for her.  Past Medical History:  Diagnosis Date  . Anemia   . Blood transfusion   . Blood transfusion without reported diagnosis    with breast surgery  . Breast cancer ([place]) 15 years ago   left   . 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 Diabetes mellitus   . Heart murmur    as child  . Hernia, incisional   . Hyperlipidemia   . Hypertension   . Hypothyroidism   . Vitamin D deficiency     Past Surgical History:  Procedure Laterality Date  . ABDOMINAL HYSTERECTOMY    . APPENDECTOMY    . COLONOSCOPY  2013   due next 03-2017  . IUD REMOVAL     with appendectomy  . MASTECTOMY, MODIFIED RADICAL W/RECONSTRUCTION Left 15 years ago   10 nodes out  . TUMOR EXCISION Left    x 2, neck, head    Current  Medications: Current Meds  Medication Sig  . ascorbic acid (VITAMIN C) 500 MG/5ML syrup Take 500 mg by mouth daily.  Marland Kitchen atorvastatin (LIPITOR) 10 MG tablet Take 1 tablet (10 mg total) by mouth daily.  . calcium carbonate (OSCAL) 1500 (600 Ca) MG TABS tablet Take by mouth daily with breakfast.  . carvedilol (COREG) 6.25 MG tablet Take 1 tablet (6.25 mg total) by mouth 2 (two) times daily.  . fenofibrate 160 MG tablet Take 160 mg by mouth daily.  . fish oil-omega-3 fatty acids 1000 MG capsule Take 2 g by mouth daily.   . furosemide (LASIX) 40 MG tablet Take 1 tablet (40 mg total) by mouth daily.  Marland Kitchen HUMALOG KWIKPEN 100 UNIT/ML KiwkPen INJECT 5  UNITS WITH MEALS PLUS SLIDING SCALE (ISF 50, TARGET 100). 30 UNITS DAILY  . iron polysaccharides (NIFEREX) 150 MG capsule Take 150 mg by mouth 2 (two) times daily.   Marland Kitchen LANTUS SOLOSTAR 100 UNIT/ML Solostar Pen Inject 6 units in the morning and 6 units in the evening  . levothyroxine (SYNTHROID, LEVOTHROID) 112 MCG tablet Take 112 mcg by mouth daily before breakfast.  . losartan (COZAAR) 100 MG tablet Take 100 mg by mouth See admin instructions. Patient taking one-half tablet (50mg ) daily  .  Menthol-Methyl Salicylate (MUSCLE RUB) 10-15 % CREA Apply 1 application topically as needed for muscle pain.  . Multiple Vitamins-Minerals (MULTIVITAMIN WITH MINERALS) tablet Take 1 tablet by mouth daily.  Glory Rosebush VERIO test strip USE TO TEST UP TO 10 TIMES D  . OVER THE COUNTER MEDICATION Take 5 mLs by mouth daily.   . ranitidine (ZANTAC) 300 MG capsule Take 300 mg by mouth 2 (two) times daily.   . Vitamin D, Ergocalciferol, (DRISDOL) 50000 UNITS CAPS Take 50,000 Units by mouth 2 (two) times a week.      Allergies:   Erythromycin; Penicillins; Nyquil [pseudoeph-doxylamine-dm-apap]; and Sulfa antibiotics   Social History   Socioeconomic History  . Marital status: Married    Spouse name: None  . Number of children: 3  . Years of education: None  . Highest  education level: None  Social Needs  . Financial resource strain: None  . Food insecurity - worry: None  . Food insecurity - inability: None  . Transportation needs - medical: None  . Transportation needs - non-medical: None  Occupational History  . Occupation: homemaker  Tobacco Use  . Smoking status: Never Smoker  . Smokeless tobacco: Never Used  Substance and Sexual Activity  . Alcohol use: No  . Drug use: No  . Sexual activity: None  Other Topics Concern  . None  Social History Narrative  . None     Family History: The patient's family history includes Congestive Heart Failure in her father; Diabetes in her other; Heart attack in her brother; Hypertension in her father, maternal grandfather, and maternal grandmother; Peripheral vascular disease in her father; Stomach cancer in her maternal grandmother. There is no history of Colon cancer, Esophageal cancer, Pancreatic cancer, or Rectal cancer. ROS:   Please see the history of present illness.    All 14 point review of systems negative except as described per history of present illness  EKGs/Labs/Other Studies Reviewed:      Recent Labs: 10/13/2017: ALT 15; B Natriuretic Peptide 332.0; Hemoglobin 8.4; Platelets 374 10/14/2017: BUN 18; Creatinine, Ser 1.49; Potassium 3.7; Sodium 137; TSH 0.980  Recent Lipid Panel    Component Value Date/Time   CHOL 184 07/23/2017 1538   TRIG 91 07/23/2017 1538   HDL 77 07/23/2017 1538   CHOLHDL 2.4 07/23/2017 1538   LDLCALC 89 07/23/2017 1538    Physical Exam:    VS:  BP (!) 120/56   Pulse 70   Ht 5' 2.5" (1.588 m)   Wt 159 lb (72.1 kg)   SpO2 96%   BMI 28.62 kg/m     Wt Readings from Last 3 Encounters:  10/16/17 159 lb (72.1 kg)  10/14/17 156 lb 4.8 oz (70.9 kg)  10/04/17 160 lb (72.6 kg)     GEN:  Well nourished, well developed in no acute distress HEENT: Normal NECK: No JVD; No carotid bruits LYMPHATICS: No lymphadenopathy CARDIAC: RRR, no murmurs, no rubs, no  gallops RESPIRATORY:  Clear to auscultation without rales, wheezing or rhonchi  ABDOMEN: Soft, non-tender, non-distended MUSCULOSKELETAL:  No edema; No deformity  SKIN: Warm and dry LOWER EXTREMITIES: Minimal swelling NEUROLOGIC:  Alert and oriented x 3 PSYCHIATRIC:  Normal affect   ASSESSMENT:    1. Dilated cardiomyopathy ([place])   2. Type 2 diabetes mellitus with other ophthalmic complication, with long-term current use of insulin ([place])   3. Mixed hyperlipidemia   4. Anemia associated with diabetes mellitus ([place])    PLAN:    In order of problems  listed above:  1. Dilated cardiomyopathy: She is on ACE inhibitor and diuretic because of recent episode of decompensated congestive heart failure I will check a Chem-7 and proBNP today.  She is on ARB as well as with beta-blocker. 2. His lipidemia continue present medications. 3. Anemia stable   Medication Adjustments/Labs and Tests Ordered: Current medicines are reviewed at length with the patient today.  Concerns regarding medicines are outlined above.  No orders of the defined types were placed in this encounter.  Medication changes: No orders of the defined types were placed in this encounter.   Signed, Park Liter, MD, [facility] 10/16/2017 3:35 PM    [facility]

## 2017-10-16 NOTE — Addendum Note (Signed)
Addended by: Aleatha Borer on: 10/16/2017 03:44 PM   Modules accepted: Orders

## 2017-10-16 NOTE — Patient Instructions (Signed)
Medication Instructions:  Your physician recommends that you continue on your current medications as directed. Please refer to the Current Medication list given to you today.  Labwork: Your physician recommends that you have lab work today: BMP and BNP to check your kidney function, electrolytes and fluid that may be around your heart/lungs  Testing/Procedures: None ordered  Follow-Up: Your physician recommends that you schedule a follow-up appointment in: 3 weeks with Dr. Agustin Cree   Any Other Special Instructions Will Be Listed Below (If Applicable).     If you need a refill on your cardiac medications before your next appointment, please call your pharmacy.

## 2017-10-17 DIAGNOSIS — E1142 Type 2 diabetes mellitus with diabetic polyneuropathy: Secondary | ICD-10-CM | POA: Diagnosis not present

## 2017-10-17 DIAGNOSIS — E559 Vitamin D deficiency, unspecified: Secondary | ICD-10-CM | POA: Diagnosis not present

## 2017-10-17 DIAGNOSIS — D126 Benign neoplasm of colon, unspecified: Secondary | ICD-10-CM | POA: Diagnosis not present

## 2017-10-17 DIAGNOSIS — N08 Glomerular disorders in diseases classified elsewhere: Secondary | ICD-10-CM | POA: Diagnosis not present

## 2017-10-17 DIAGNOSIS — E038 Other specified hypothyroidism: Secondary | ICD-10-CM | POA: Diagnosis not present

## 2017-10-17 DIAGNOSIS — Z6828 Body mass index (BMI) 28.0-28.9, adult: Secondary | ICD-10-CM | POA: Diagnosis not present

## 2017-10-17 DIAGNOSIS — I10 Essential (primary) hypertension: Secondary | ICD-10-CM | POA: Diagnosis not present

## 2017-10-17 DIAGNOSIS — Z1389 Encounter for screening for other disorder: Secondary | ICD-10-CM | POA: Diagnosis not present

## 2017-10-17 DIAGNOSIS — E7849 Other hyperlipidemia: Secondary | ICD-10-CM | POA: Diagnosis not present

## 2017-10-17 DIAGNOSIS — I420 Dilated cardiomyopathy: Secondary | ICD-10-CM | POA: Diagnosis not present

## 2017-10-17 DIAGNOSIS — D6489 Other specified anemias: Secondary | ICD-10-CM | POA: Diagnosis not present

## 2017-10-17 DIAGNOSIS — E1140 Type 2 diabetes mellitus with diabetic neuropathy, unspecified: Secondary | ICD-10-CM | POA: Diagnosis not present

## 2017-10-17 LAB — BASIC METABOLIC PANEL
BUN/Creatinine Ratio: 21 (ref 12–28)
BUN: 29 mg/dL — ABNORMAL HIGH (ref 8–27)
CALCIUM: 9 mg/dL (ref 8.7–10.3)
CO2: 28 mmol/L (ref 20–29)
CREATININE: 1.37 mg/dL — AB (ref 0.57–1.00)
Chloride: 97 mmol/L (ref 96–106)
GFR, EST AFRICAN AMERICAN: 44 mL/min/{1.73_m2} — AB (ref >59)
GFR, EST NON AFRICAN AMERICAN: 38 mL/min/{1.73_m2} — AB (ref >59)
Glucose: 419 mg/dL — ABNORMAL HIGH (ref 65–99)
POTASSIUM: 5.3 mmol/L — AB (ref 3.5–5.2)
Sodium: 137 mmol/L (ref 134–144)

## 2017-10-17 LAB — PRO B NATRIURETIC PEPTIDE: NT-Pro BNP: 1348 pg/mL — ABNORMAL HIGH (ref 0–738)

## 2017-10-25 DIAGNOSIS — Z6829 Body mass index (BMI) 29.0-29.9, adult: Secondary | ICD-10-CM | POA: Diagnosis not present

## 2017-10-25 DIAGNOSIS — I5023 Acute on chronic systolic (congestive) heart failure: Secondary | ICD-10-CM | POA: Diagnosis not present

## 2017-10-29 DIAGNOSIS — I420 Dilated cardiomyopathy: Secondary | ICD-10-CM | POA: Diagnosis not present

## 2017-10-29 NOTE — Addendum Note (Signed)
Addended by: Mattie Marlin on: 10/29/2017 10:12 AM   Modules accepted: Orders

## 2017-10-30 LAB — BASIC METABOLIC PANEL
BUN / CREAT RATIO: 18 (ref 12–28)
BUN: 22 mg/dL (ref 8–27)
CO2: 28 mmol/L (ref 20–29)
Calcium: 9.4 mg/dL (ref 8.7–10.3)
Chloride: 101 mmol/L (ref 96–106)
Creatinine, Ser: 1.19 mg/dL — ABNORMAL HIGH (ref 0.57–1.00)
GFR, EST AFRICAN AMERICAN: 52 mL/min/{1.73_m2} — AB (ref >59)
GFR, EST NON AFRICAN AMERICAN: 45 mL/min/{1.73_m2} — AB (ref >59)
Glucose: 173 mg/dL — ABNORMAL HIGH (ref 65–99)
POTASSIUM: 5.1 mmol/L (ref 3.5–5.2)
SODIUM: 142 mmol/L (ref 134–144)

## 2017-10-30 LAB — PRO B NATRIURETIC PEPTIDE: NT-Pro BNP: 2010 pg/mL — ABNORMAL HIGH (ref 0–738)

## 2017-11-01 DIAGNOSIS — E113312 Type 2 diabetes mellitus with moderate nonproliferative diabetic retinopathy with macular edema, left eye: Secondary | ICD-10-CM | POA: Diagnosis not present

## 2017-11-06 ENCOUNTER — Encounter: Payer: Self-pay | Admitting: Cardiology

## 2017-11-06 ENCOUNTER — Ambulatory Visit (INDEPENDENT_AMBULATORY_CARE_PROVIDER_SITE_OTHER): Payer: Medicare Other | Admitting: Cardiology

## 2017-11-06 VITALS — BP 150/68 | HR 63 | Ht 62.5 in | Wt 161.0 lb

## 2017-11-06 DIAGNOSIS — Z794 Long term (current) use of insulin: Secondary | ICD-10-CM

## 2017-11-06 DIAGNOSIS — I420 Dilated cardiomyopathy: Secondary | ICD-10-CM | POA: Diagnosis not present

## 2017-11-06 DIAGNOSIS — E782 Mixed hyperlipidemia: Secondary | ICD-10-CM | POA: Diagnosis not present

## 2017-11-06 DIAGNOSIS — E1139 Type 2 diabetes mellitus with other diabetic ophthalmic complication: Secondary | ICD-10-CM | POA: Diagnosis not present

## 2017-11-06 NOTE — Addendum Note (Signed)
Addended by: Aleatha Borer on: 11/06/2017 04:22 PM   Modules accepted: Orders

## 2017-11-06 NOTE — Patient Instructions (Signed)
Medication Instructions:  Your physician recommends that you continue on your current medications as directed. Please refer to the Current Medication list given to you today.  Labwork: Your physician recommends that you have lab work today: BMP and BNP  Testing/Procedures: None ordered  Follow-Up: Your physician recommends that you schedule a follow-up appointment in: 1 month with Dr. Agustin Cree   Any Other Special Instructions Will Be Listed Below (If Applicable).     If you need a refill on your cardiac medications before your next appointment, please call your pharmacy.

## 2017-11-06 NOTE — Progress Notes (Signed)
Cardiology Office Note:    Date:  11/06/2017   ID:  Elizabeth Olsen, DOB Mar 19, 1941, MRN [id number]  PCP:  [name], MD  Cardiologist:  [name], MD    Referring MD: [name], MD   Chief Complaint  Patient presents with  . 3 week follow up  Doing well  History of Present Illness:    Elizabeth Olsen is a 77 y.o. female with nonischemic cardiomyopathy.  There was some issue with her medications as she did not take for a few days but now she is doing well.  Denies having shortness of breath chest pain tightness squeezing pressure burning chest no swelling of lower extremities  Past Medical History:  Diagnosis Date  . Anemia   . Blood transfusion   . Blood transfusion without reported diagnosis    with breast surgery  . Breast cancer ([place]) 15 years ago   left   . 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 Diabetes mellitus   . Heart murmur    as child  . Hernia, incisional   . Hyperlipidemia   . Hypertension   . Hypothyroidism   . Vitamin D deficiency     Past Surgical History:  Procedure Laterality Date  . ABDOMINAL HYSTERECTOMY    . APPENDECTOMY    . COLONOSCOPY  2013   due next 03-2017  . IUD REMOVAL     with appendectomy  . MASTECTOMY, MODIFIED RADICAL W/RECONSTRUCTION Left 15 years ago   10 nodes out  . TUMOR EXCISION Left    x 2, neck, head    Current Medications: Current Meds  Medication Sig  . ascorbic acid (VITAMIN C) 500 MG/5ML syrup Take 500 mg by mouth daily.  Marland Kitchen atorvastatin (LIPITOR) 10 MG tablet Take 1 tablet (10 mg total) by mouth daily.  . calcium carbonate (OSCAL) 1500 (600 Ca) MG TABS tablet Take by mouth daily with breakfast.  . carvedilol (COREG) 6.25 MG tablet Take 1 tablet (6.25 mg total) by mouth 2 (two) times daily.  . fenofibrate 160 MG tablet Take 160 mg by mouth daily.  . fish oil-omega-3 fatty acids 1000 MG capsule Take 2 g by mouth daily.   . furosemide (LASIX) 40 MG tablet Take 1 tablet (40 mg total) by mouth daily.  (Patient taking differently: Take 60 mg by mouth daily. )  . HUMALOG KWIKPEN 100 UNIT/ML KiwkPen INJECT 5  UNITS WITH MEALS PLUS SLIDING SCALE (ISF 50, TARGET 100). 30 UNITS DAILY  . iron polysaccharides (NIFEREX) 150 MG capsule Take 150 mg by mouth 2 (two) times daily.   Marland Kitchen LANTUS SOLOSTAR 100 UNIT/ML Solostar Pen Inject 7 units in the morning and 7 units in the evening  . levothyroxine (SYNTHROID, LEVOTHROID) 112 MCG tablet Take 112 mcg by mouth daily before breakfast.  . losartan (COZAAR) 100 MG tablet Take 100 mg by mouth See admin instructions. Patient taking one-half tablet (50mg ) daily  . Menthol-Methyl Salicylate (MUSCLE RUB) 10-15 % CREA Apply 1 application topically as needed for muscle pain.  . Multiple Vitamins-Minerals (MULTIVITAMIN WITH MINERALS) tablet Take 1 tablet by mouth daily.  Glory Rosebush VERIO test strip USE TO TEST UP TO 10 TIMES D  . OVER THE COUNTER MEDICATION Take 5 mLs by mouth daily.   . ranitidine (ZANTAC) 300 MG capsule Take 300 mg by mouth 2 (two) times daily.   . Vitamin D, Ergocalciferol, (DRISDOL) 50000 UNITS CAPS Take 50,000 Units by mouth 2 (two) times a week.  Allergies:   Erythromycin; Penicillins; Nyquil [pseudoeph-doxylamine-dm-apap]; and Sulfa antibiotics   Social History   Socioeconomic History  . Marital status: Married    Spouse name: None  . Number of children: 3  . Years of education: None  . Highest education level: None  Social Needs  . Financial resource strain: None  . Food insecurity - worry: None  . Food insecurity - inability: None  . Transportation needs - medical: None  . Transportation needs - non-medical: None  Occupational History  . Occupation: homemaker  Tobacco Use  . Smoking status: Never Smoker  . Smokeless tobacco: Never Used  Substance and Sexual Activity  . Alcohol use: No  . Drug use: No  . Sexual activity: None  Other Topics Concern  . None  Social History Narrative  . None     Family History: The  patient's family history includes Congestive Heart Failure in her father; Diabetes in her other; Heart attack in her brother; Hypertension in her father, maternal grandfather, and maternal grandmother; Peripheral vascular disease in her father; Stomach cancer in her maternal grandmother. There is no history of Colon cancer, Esophageal cancer, Pancreatic cancer, or Rectal cancer. ROS:   Please see the history of present illness.    All 14 point review of systems negative except as described per history of present illness  EKGs/Labs/Other Studies Reviewed:      Recent Labs: 10/13/2017: ALT 15; B Natriuretic Peptide 332.0; Hemoglobin 8.4; Platelets 374 10/14/2017: TSH 0.980 10/29/2017: BUN 22; Creatinine, Ser 1.19; NT-Pro BNP 2,010; Potassium 5.1; Sodium 142  Recent Lipid Panel    Component Value Date/Time   CHOL 184 07/23/2017 1538   TRIG 91 07/23/2017 1538   HDL 77 07/23/2017 1538   CHOLHDL 2.4 07/23/2017 1538   LDLCALC 89 07/23/2017 1538    Physical Exam:    VS:  BP (!) 150/68   Pulse 63   Ht 5' 2.5" (1.588 m)   Wt 161 lb (73 kg)   SpO2 96%   BMI 28.98 kg/m     Wt Readings from Last 3 Encounters:  11/06/17 161 lb (73 kg)  10/16/17 159 lb (72.1 kg)  10/14/17 156 lb 4.8 oz (70.9 kg)     GEN:  Well nourished, well developed in no acute distress HEENT: Normal NECK: No JVD; No carotid bruits LYMPHATICS: No lymphadenopathy CARDIAC: RRR, no murmurs, no rubs, no gallops RESPIRATORY:  Clear to auscultation without rales, wheezing or rhonchi  ABDOMEN: Soft, non-tender, non-distended MUSCULOSKELETAL:  No edema; No deformity  SKIN: Warm and dry LOWER EXTREMITIES: no swelling NEUROLOGIC:  Alert and oriented x 3 PSYCHIATRIC:  Normal affect   ASSESSMENT:    1. Dilated cardiomyopathy ([place])   2. Type 2 diabetes mellitus with other ophthalmic complication, with long-term current use of insulin ([place])   3. Mixed hyperlipidemia    PLAN:    In order of problems listed  above:  1. Dilated cardiomyopathy: Appropriate medications which I will continue we will check Chem-7 and proBNP today. 2. Type 2 diabetes: I stressed the importance of taking medication and following instructions. 3. Dyslipidemia we will continue present medications. 4. Essential hypertension and concerned about her blood pressure being low asked to check her blood pressure on the regular basis well at home.   Medication Adjustments/Labs and Tests Ordered: Current medicines are reviewed at length with the patient today.  Concerns regarding medicines are outlined above.  No orders of the defined types were placed in this encounter.  Medication changes:  No orders of the defined types were placed in this encounter.   Signed, Park Liter, MD, [facility] 11/06/2017 4:18 PM    [facility]

## 2017-11-07 ENCOUNTER — Inpatient Hospital Stay: Payer: Medicare Other

## 2017-11-07 ENCOUNTER — Inpatient Hospital Stay: Payer: Medicare Other | Attending: Hematology | Admitting: Hematology

## 2017-11-07 ENCOUNTER — Encounter: Payer: Self-pay | Admitting: Hematology

## 2017-11-07 ENCOUNTER — Telehealth: Payer: Self-pay | Admitting: Hematology

## 2017-11-07 DIAGNOSIS — D649 Anemia, unspecified: Secondary | ICD-10-CM

## 2017-11-07 DIAGNOSIS — Z800 Family history of malignant neoplasm of digestive organs: Secondary | ICD-10-CM | POA: Diagnosis not present

## 2017-11-07 DIAGNOSIS — I509 Heart failure, unspecified: Secondary | ICD-10-CM

## 2017-11-07 DIAGNOSIS — E119 Type 2 diabetes mellitus without complications: Secondary | ICD-10-CM | POA: Diagnosis not present

## 2017-11-07 DIAGNOSIS — N189 Chronic kidney disease, unspecified: Secondary | ICD-10-CM | POA: Diagnosis not present

## 2017-11-07 DIAGNOSIS — Z853 Personal history of malignant neoplasm of breast: Secondary | ICD-10-CM | POA: Diagnosis not present

## 2017-11-07 LAB — CMP (CANCER CENTER ONLY)
ALK PHOS: 42 U/L (ref 40–150)
ALT: 14 U/L (ref 0–55)
AST: 29 U/L (ref 5–34)
Albumin: 3.6 g/dL (ref 3.5–5.0)
Anion gap: 10 (ref 3–11)
BILIRUBIN TOTAL: 0.4 mg/dL (ref 0.2–1.2)
BUN: 34 mg/dL — AB (ref 7–26)
CALCIUM: 9.5 mg/dL (ref 8.4–10.4)
CO2: 31 mmol/L — ABNORMAL HIGH (ref 22–29)
CREATININE: 1.53 mg/dL — AB (ref 0.70–1.30)
Chloride: 99 mmol/L (ref 98–109)
GFR, EST NON AFRICAN AMERICAN: 32 mL/min — AB (ref >60)
GFR, Est AFR Am: 37 mL/min — ABNORMAL LOW (ref >60)
GLUCOSE: 106 mg/dL (ref 70–140)
POTASSIUM: 3.8 mmol/L (ref 3.3–4.7)
Sodium: 140 mmol/L (ref 136–145)
TOTAL PROTEIN: 7 g/dL (ref 6.4–8.3)

## 2017-11-07 LAB — CBC WITH DIFFERENTIAL (CANCER CENTER ONLY)
Basophils Absolute: 0 10*3/uL (ref 0.0–0.1)
Basophils Relative: 0 %
Eosinophils Absolute: 0.2 10*3/uL (ref 0.0–0.5)
Eosinophils Relative: 3 %
HEMATOCRIT: 27.9 % — AB (ref 34.8–46.6)
HEMOGLOBIN: 8.9 g/dL — AB (ref 11.6–15.9)
LYMPHS PCT: 35 %
Lymphs Abs: 2.3 10*3/uL (ref 0.9–3.3)
MCH: 28.7 pg (ref 25.1–34.0)
MCHC: 31.9 g/dL (ref 31.5–36.0)
MCV: 90 fL (ref 79.5–101.0)
MONO ABS: 0.9 10*3/uL (ref 0.1–0.9)
MONOS PCT: 14 %
NEUTROS ABS: 3.3 10*3/uL (ref 1.5–6.5)
NEUTROS PCT: 48 %
Platelet Count: 360 10*3/uL (ref 145–400)
RBC: 3.1 MIL/uL — ABNORMAL LOW (ref 3.70–5.45)
RDW: 13.1 % (ref 11.2–16.1)
WBC Count: 6.8 10*3/uL (ref 4.0–10.3)

## 2017-11-07 LAB — RETICULOCYTES
RBC.: 3.1 MIL/uL — ABNORMAL LOW (ref 3.70–5.45)
Retic Count, Absolute: 24.8 10*3/uL — ABNORMAL LOW (ref 33.7–90.7)
Retic Ct Pct: 0.8 % (ref 0.7–2.1)

## 2017-11-07 LAB — VITAMIN B12: VITAMIN B 12: 1318 pg/mL — AB (ref 180–914)

## 2017-11-07 NOTE — Progress Notes (Signed)
[facility]  [phone] [phone]  Clinic New consult Note   Patient Care Team: [name], MD as PCP - General (Family Medicine) Reynold Bowen, MD as Consulting Physician (Endocrinology) 11/07/2017  REFERRALPHYSICIAN: [name], MD  CHIEF COMPLAINTS/PURPOSE OF CONSULTATION:  Hypochromic microcytic Anemia  HISTORY OF PRESENTING ILLNESS:  Elizabeth Olsen Feeling 77 y.o. female is here because of  anemia. She was referred by Dr. Alyson Ingles at [facility] for her worsening microcytic hypochromic anemia despite iron supplementation. Her Hgb was 9, iron sat 18% and her iron level was 58 per most recent labs on 10/09/17. Pt states she has been diagnosed as anemic for over a year. Pt reports increasing fatigue over the past year and states doing errands and chores is sometimes difficult. Pt has some leg swelling that she treats with Lasix. Pt denies night sweats.   Pt has a PMHx of CHF, DM, Breast CA. She is treated by a cardiologist, Dr. Agustin Cree and her DM is monitored by her PCP, Dr. Alyson Ingles. Pt's CA was treated by Dr. Benay Spice in 1998. Pt underwent a left breast mastectomy and chemotherapy. She does not currently take any AEOT. She does get yearly screening mammograms. No other Fhx of CA. No tobacco use.   On review of systems, pt denies fever, chills, rash, mouth sores, decreased appetite, urinary complaints. Denies pain. Pt denies abdominal pain, nausea, vomiting. Pertinent positives are listed and detailed within the above HPI.    MEDICAL HISTORY:  Past Medical History:  Diagnosis Date  . Anemia   . Blood transfusion   . Blood transfusion without reported diagnosis    with breast surgery  . Breast cancer ([place]) 15 years ago   left   . CHF (congestive heart failure) ([place])   . 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 Diabetes mellitus   . Heart murmur    as child  . Hernia, incisional   . Hyperlipidemia   . Hypertension   . Hypothyroidism   .  Vitamin D deficiency     SURGICAL HISTORY: Past Surgical History:  Procedure Laterality Date  . ABDOMINAL HYSTERECTOMY    . APPENDECTOMY    . COLONOSCOPY  2013   due next 03-2017  . IUD REMOVAL     with appendectomy  . MASTECTOMY, MODIFIED RADICAL W/RECONSTRUCTION Left 15 years ago   10 nodes out  . TUMOR EXCISION Left    x 2, neck, head    SOCIAL HISTORY: Social History   Socioeconomic History  . Marital status: Married    Spouse name: Not on file  . Number of children: 3  . Years of education: Not on file  . Highest education level: Not on file  Social Needs  . Financial resource strain: Not on file  . Food insecurity - worry: Not on file  . Food insecurity - inability: Not on file  . Transportation needs - medical: Not on file  . Transportation needs - non-medical: Not on file  Occupational History  . Occupation: homemaker  Tobacco Use  . Smoking status: Never Smoker  . Smokeless tobacco: Never Used  Substance and Sexual Activity  . Alcohol use: No  . Drug use: No  . Sexual activity: Not on file  Other Topics Concern  . Not on file  Social History Narrative  . Not on file    FAMILY HISTORY: Family History  Problem Relation Age of Onset  . Diabetes Other  both sides of family  . Hypertension Father   . Congestive Heart Failure Father   . Peripheral vascular disease Father   . Hypertension Maternal Grandfather   . Hypertension Maternal Grandmother   . Stomach cancer Maternal Grandmother        GGM  . Heart attack Brother   . Colon cancer Neg Hx   . Esophageal cancer Neg Hx   . Pancreatic cancer Neg Hx   . Rectal cancer Neg Hx     ALLERGIES:  is allergic to erythromycin; penicillins; nyquil [pseudoeph-doxylamine-dm-apap]; and sulfa antibiotics.  MEDICATIONS:  Current Outpatient Medications  Medication Sig Dispense Refill  . ascorbic acid (VITAMIN C) 500 MG/5ML syrup Take 500 mg by mouth daily.    Marland Kitchen atorvastatin (LIPITOR) 10 MG tablet Take  1 tablet (10 mg total) by mouth daily. 30 tablet 11  . calcium carbonate (OSCAL) 1500 (600 Ca) MG TABS tablet Take by mouth daily with breakfast.    . carvedilol (COREG) 6.25 MG tablet Take 1 tablet (6.25 mg total) by mouth 2 (two) times daily. 60 tablet 11  . fenofibrate 160 MG tablet Take 160 mg by mouth daily.    . fish oil-omega-3 fatty acids 1000 MG capsule Take 2 g by mouth daily.     . furosemide (LASIX) 40 MG tablet Take 1 tablet (40 mg total) by mouth daily. (Patient taking differently: Take 60 mg by mouth daily. ) 30 tablet 11  . HUMALOG KWIKPEN 100 UNIT/ML KiwkPen INJECT 5  UNITS WITH MEALS PLUS SLIDING SCALE (ISF 50, TARGET 100). 30 UNITS DAILY  2  . iron polysaccharides (NIFEREX) 150 MG capsule Take 150 mg by mouth 2 (two) times daily.     Marland Kitchen LANTUS SOLOSTAR 100 UNIT/ML Solostar Pen Inject 7 units in the morning and 7 units in the evening  2  . levothyroxine (SYNTHROID, LEVOTHROID) 112 MCG tablet Take 112 mcg by mouth daily before breakfast.    . losartan (COZAAR) 100 MG tablet Take 100 mg by mouth See admin instructions. Patient taking one-half tablet (20m) daily    . Menthol-Methyl Salicylate (MUSCLE RUB) 10-15 % CREA Apply 1 application topically as needed for muscle pain.    . Multiple Vitamins-Minerals (MULTIVITAMIN WITH MINERALS) tablet Take 1 tablet by mouth daily.    .Glory RosebushVERIO test strip USE TO TEST UP TO 10 TIMES D  12  . OVER THE COUNTER MEDICATION Take 5 mLs by mouth daily.     . ranitidine (ZANTAC) 300 MG capsule Take 300 mg by mouth 2 (two) times daily.     . Vitamin D, Ergocalciferol, (DRISDOL) 50000 UNITS CAPS Take 50,000 Units by mouth 2 (two) times a week.      No current facility-administered medications for this visit.     REVIEW OF SYSTEMS:   Constitutional: Denies fevers, chills or abnormal night sweats Eyes: Denies blurriness of vision, double vision or watery eyes Ears, nose, mouth, throat, and face: Denies mucositis or sore throat Respiratory:  Denies cough, dyspnea or wheezes Cardiovascular: Denies palpitation, chest discomfort (+) lower extremity swelling Gastrointestinal:  Denies nausea, heartburn or change in bowel habits Skin: Denies abnormal skin rashes Lymphatics: Denies new lymphadenopathy or easy bruising Neurological:Denies numbness, tingling or new weaknesses Behavioral/Psych: Mood is stable, no new changes  All other systems were reviewed with the patient and are negative.  PHYSICAL EXAMINATION: ECOG PERFORMANCE STATUS: 1 - Symptomatic but completely ambulatory  Vitals:   11/07/17 1501  BP: (!) 135/59  Pulse: 78  Resp: 18  Temp: 98.3 F (36.8 C)  SpO2: 99%   Filed Weights   11/07/17 1501  Weight: 158 lb 6.4 oz (71.8 kg)    GENERAL:alert, no distress and comfortable SKIN: skin color, texture, turgor are normal, no rashes or significant lesions EYES: normal, conjunctiva are pink and non-injected, sclera clear OROPHARYNX:no exudate, no erythema and lips, buccal mucosa, and tongue normal  NECK: supple, thyroid normal size, non-tender, without nodularity LYMPH:  no palpable lymphadenopathy in the cervical, axillary or inguinal LUNGS: clear to auscultation and percussion with normal breathing effort HEART: regular rate & rhythm and no murmurs and no lower extremity edema ABDOMEN:abdomen soft, non-tender and normal bowel sounds Musculoskeletal:no cyanosis of digits and no clubbing  PSYCH: alert & oriented x 3 with fluent speech NEURO: no focal motor/sensory deficits  LABORATORY DATA:  I have reviewed the data as listed CBC Latest Ref Rng & Units 11/07/2017 10/13/2017 07/23/2017  WBC 4.0 - 10.5 K/uL - 7.4 8.4  Hemoglobin 12.0 - 15.0 g/dL - 8.4(L) 9.7(L)  Hematocrit 34.8 - 46.6 % 27.9(L) 25.6(L) 30.5(L)  Platelets 150 - 400 K/uL - 374 436(H)    CMP Latest Ref Rng & Units 11/07/2017 10/29/2017 10/16/2017  Glucose 70 - 140 mg/dL 106 173(H) 419(H)  BUN 7 - 26 mg/dL 34(H) 22 29(H)  Creatinine 0.57 - 1.00 mg/dL -  1.19(H) 1.37(H)  Sodium 136 - 145 mmol/L 140 142 137  Potassium 3.3 - 4.7 mmol/L 3.8 5.1 5.3(H)  Chloride 98 - 109 mmol/L 99 101 97  CO2 22 - 29 mmol/L 31(H) 28 28  Calcium 8.4 - 10.4 mg/dL 9.5 9.4 9.0  Total Protein 6.4 - 8.3 g/dL 7.0 - -  Total Bilirubin 0.2 - 1.2 mg/dL 0.4 - -  Alkaline Phos 40 - 150 U/L 42 - -  AST 5 - 34 U/L 29 - -  ALT 0 - 55 U/L 14 - -   PROCEDURE: Colonoscopy 08/09/17 FINDINGS - The perianal and digital rectal examinations were normal. - A 7 mm polyp was found in the descending colon. The polyp was sessile. The polyp was removed with a cold snare. Resection and retrieval were complete. - Internal hemorrhoids were found during retroflexion. The hemorrhoids were small and Grade I (internal hemorrhoids that do not prolapse). - The exam was otherwise without abnormality on direct and retroflexion views.  RADIOGRAPHIC STUDIES: I have personally reviewed the radiological images as listed and agreed with the findings in the report. Dg Chest 2 View  Result Date: 10/13/2017 CLINICAL DATA:  Shortness of Breath EXAM: CHEST  2 VIEW COMPARISON:  06/19/2017 FINDINGS: Cardiac shadow is mildly enlarged but stable. The lungs are well aerated bilaterally. Mild central vascular congestion is noted. Patchy infiltrate is noted in the medial right lung base. No other focal abnormality is seen. IMPRESSION: Mild vascular congestion. Patchy infiltrate in the right lung base. Electronically Signed   By: Inez Catalina M.D.   On: 10/13/2017 07:36    ASSESSMENT & PLAN:  No problem-specific Assessment & Plan notes found for this encounter.  1. Normocytic Anemia, hypo-productive  -I have reviewed her previous CBC and anemia workup, she had normal ferritin, iron study, M42 and folic acid in 6834, ret was low, normal MCV.   -She does have chronic disease including uncontrolled diabetes, congestive heart failure, and mild CKD, which can cause anemia of chronic disease -We discussed the  potential etiology of anemia, including nutritional anemia, anemia of chronic disease, and primary marrow disease, such as MDS. -  I command anemia workup today, including CBC, CMP, ret count, LDH, ferritin, iron study, B12 and folate, multiple myeloma panel, and erythropoietin level -Discussed possible treatment with Aranesp injections further in the future if she does have anemia of chronic disease  -if she does have persistent moderate anemia, and as above lab work is unremarkable, I would recommend a bone marrow biopsy before we start Aranesp injection. - I will call with lab results and discuss further steps then  2. CHF -Pt sees a cardiologist, Dr. Agustin Cree regularly. She takes 40 mg Lasix   3. DM - Follow up with PCP, Dr. Alyson Ingles for monitoring  PLAN: -lab today, I will call her in 1-2 weeks to review her lab results, and decide if she will proceed with a bone marrow biopsy. -lab and Follow up in 1-2 months  All questions were answered. The patient knows to call the clinic with any problems, questions or concerns . I spent 30 counseling the patient face to face. The total time spent in the appointment was 40 and more than 50% was on counseling.     Truitt Merle, MD 11/07/17   This document serves as a record of services personally performed by Truitt Merle, MD. It was created on her behalf by Theresia Bough, a trained medical scribe. The creation of this record is based on the scribe's personal observations and the provider's statements to them.   I have reviewed the above documentation for accuracy and completeness, and I agree with the above.

## 2017-11-07 NOTE — Telephone Encounter (Signed)
Gave avs and calendar for february °

## 2017-11-08 ENCOUNTER — Encounter: Payer: Self-pay | Admitting: Hematology

## 2017-11-08 LAB — FOLATE RBC
Folate, RBC: >2199 ng/mL (ref >498)
Hematocrit: 28.2 % — ABNORMAL LOW (ref 34.0–46.6)

## 2017-11-08 LAB — ERYTHROPOIETIN: Erythropoietin: 12.2 m[IU]/mL (ref 2.6–18.5)

## 2017-11-08 LAB — IRON AND TIBC
IRON: 56 ug/dL (ref 41–142)
Saturation Ratios: 16 % — ABNORMAL LOW (ref 21–57)
TIBC: 344 ug/dL (ref 236–444)
UIBC: 287 ug/dL

## 2017-11-08 LAB — HEAVY METALS, BLOOD
Arsenic: 9 ug/L (ref 2–23)
Lead: NOT DETECTED ug/dL (ref 0–4)
Mercury: NOT DETECTED ug/L (ref 0.0–14.9)

## 2017-11-08 LAB — FERRITIN: FERRITIN: 332 ng/mL — AB (ref 9–269)

## 2017-11-08 LAB — LACTATE DEHYDROGENASE: LDH: 241 U/L (ref 125–245)

## 2017-11-09 LAB — METHYLMALONIC ACID, SERUM: Methylmalonic Acid, Quantitative: 381 nmol/L — ABNORMAL HIGH (ref 0–378)

## 2017-11-12 LAB — MULTIPLE MYELOMA PANEL, SERUM
ALBUMIN/GLOB SERPL: 1.2 (ref 0.7–1.7)
ALPHA 1: 0.3 g/dL (ref 0.0–0.4)
ALPHA2 GLOB SERPL ELPH-MCNC: 0.8 g/dL (ref 0.4–1.0)
Albumin SerPl Elph-Mcnc: 3.7 g/dL (ref 2.9–4.4)
B-Globulin SerPl Elph-Mcnc: 1.1 g/dL (ref 0.7–1.3)
GAMMA GLOB SERPL ELPH-MCNC: 0.9 g/dL (ref 0.4–1.8)
GLOBULIN, TOTAL: 3.1 g/dL (ref 2.2–3.9)
IGG (IMMUNOGLOBIN G), SERUM: 943 mg/dL (ref 700–1600)
IgA: 267 mg/dL (ref 64–422)
IgM (Immunoglobulin M), Srm: 37 mg/dL (ref 26–217)
Total Protein ELP: 6.8 g/dL (ref 6.0–8.5)

## 2017-11-14 ENCOUNTER — Telehealth: Payer: Self-pay | Admitting: Hematology

## 2017-11-14 NOTE — Telephone Encounter (Signed)
Spoke to patient regarding upcoming January appointments per 1/15 sch message

## 2017-11-16 ENCOUNTER — Telehealth: Payer: Self-pay | Admitting: *Deleted

## 2017-11-16 NOTE — Telephone Encounter (Signed)
-----   Message from Truitt Merle, MD sent at 11/13/2017  2:03 PM EST ----- Please call lab to schedule her bm bx 1/23 8am  Thanks  Krista Blue

## 2017-11-16 NOTE — Telephone Encounter (Signed)
Called lab/BMBX tech & scheduled BMBX for 11/21/17 @ 8 am.

## 2017-11-19 ENCOUNTER — Encounter (HOSPITAL_COMMUNITY): Payer: Self-pay | Admitting: Emergency Medicine

## 2017-11-19 ENCOUNTER — Other Ambulatory Visit: Payer: Self-pay

## 2017-11-19 ENCOUNTER — Emergency Department (HOSPITAL_COMMUNITY): Payer: Medicare Other

## 2017-11-19 ENCOUNTER — Emergency Department (HOSPITAL_COMMUNITY)
Admission: EM | Admit: 2017-11-19 | Discharge: 2017-11-20 | Disposition: A | Discharge status: Home / Self Care | Payer: Medicare Other | Attending: Emergency Medicine | Admitting: Emergency Medicine

## 2017-11-19 DIAGNOSIS — E108 Type 1 diabetes mellitus with unspecified complications: Secondary | ICD-10-CM | POA: Diagnosis not present

## 2017-11-19 DIAGNOSIS — R197 Diarrhea, unspecified: Secondary | ICD-10-CM | POA: Diagnosis not present

## 2017-11-19 DIAGNOSIS — R739 Hyperglycemia, unspecified: Secondary | ICD-10-CM | POA: Diagnosis not present

## 2017-11-19 DIAGNOSIS — E119 Type 2 diabetes mellitus without complications: Secondary | ICD-10-CM | POA: Insufficient documentation

## 2017-11-19 DIAGNOSIS — R112 Nausea with vomiting, unspecified: Secondary | ICD-10-CM | POA: Diagnosis not present

## 2017-11-19 DIAGNOSIS — I5020 Unspecified systolic (congestive) heart failure: Secondary | ICD-10-CM | POA: Insufficient documentation

## 2017-11-19 DIAGNOSIS — Z853 Personal history of malignant neoplasm of breast: Secondary | ICD-10-CM | POA: Insufficient documentation

## 2017-11-19 DIAGNOSIS — E1165 Type 2 diabetes mellitus with hyperglycemia: Secondary | ICD-10-CM | POA: Diagnosis not present

## 2017-11-19 DIAGNOSIS — Z794 Long term (current) use of insulin: Secondary | ICD-10-CM | POA: Insufficient documentation

## 2017-11-19 DIAGNOSIS — Z79899 Other long term (current) drug therapy: Secondary | ICD-10-CM | POA: Diagnosis not present

## 2017-11-19 DIAGNOSIS — E039 Hypothyroidism, unspecified: Secondary | ICD-10-CM | POA: Diagnosis not present

## 2017-11-19 DIAGNOSIS — I110 Hypertensive heart disease with heart failure: Secondary | ICD-10-CM | POA: Diagnosis not present

## 2017-11-19 DIAGNOSIS — R109 Unspecified abdominal pain: Secondary | ICD-10-CM | POA: Insufficient documentation

## 2017-11-19 DIAGNOSIS — R1031 Right lower quadrant pain: Secondary | ICD-10-CM | POA: Diagnosis not present

## 2017-11-19 LAB — CBC WITH DIFFERENTIAL/PLATELET
Basophils Absolute: 0 10*3/uL (ref 0.0–0.1)
Basophils Relative: 0 %
Eosinophils Absolute: 0.2 10*3/uL (ref 0.0–0.7)
Eosinophils Relative: 1 %
HCT: 27.8 % — ABNORMAL LOW (ref 36.0–46.0)
Hemoglobin: 9.3 g/dL — ABNORMAL LOW (ref 12.0–15.0)
Lymphocytes Relative: 6 %
Lymphs Abs: 0.8 10*3/uL (ref 0.7–4.0)
MCH: 29.2 pg (ref 26.0–34.0)
MCHC: 33.5 g/dL (ref 30.0–36.0)
MCV: 87.1 fL (ref 78.0–100.0)
Monocytes Absolute: 1.6 10*3/uL — ABNORMAL HIGH (ref 0.1–1.0)
Monocytes Relative: 12 %
Neutro Abs: 11.3 10*3/uL — ABNORMAL HIGH (ref 1.7–7.7)
Neutrophils Relative %: 81 %
Platelets: 379 10*3/uL (ref 150–400)
RBC: 3.19 MIL/uL — ABNORMAL LOW (ref 3.87–5.11)
RDW: 13.2 % (ref 11.5–15.5)
WBC: 13.9 10*3/uL — ABNORMAL HIGH (ref 4.0–10.5)

## 2017-11-19 LAB — COMPREHENSIVE METABOLIC PANEL
ALT: 14 U/L (ref 14–54)
AST: 52 U/L — ABNORMAL HIGH (ref 15–41)
Albumin: 3.5 g/dL (ref 3.5–5.0)
Alkaline Phosphatase: 34 U/L — ABNORMAL LOW (ref 38–126)
Anion gap: 9 (ref 5–15)
BUN: 41 mg/dL — ABNORMAL HIGH (ref 6–20)
CO2: 28 mmol/L (ref 22–32)
Calcium: 8.7 mg/dL — ABNORMAL LOW (ref 8.9–10.3)
Chloride: 98 mmol/L — ABNORMAL LOW (ref 101–111)
Creatinine, Ser: 1.45 mg/dL — ABNORMAL HIGH (ref 0.44–1.00)
GFR calc Af Amer: 40 mL/min — ABNORMAL LOW (ref >60)
GFR calc non Af Amer: 34 mL/min — ABNORMAL LOW (ref >60)
Glucose, Bld: 279 mg/dL — ABNORMAL HIGH (ref 65–99)
Potassium: 4.4 mmol/L (ref 3.5–5.1)
Sodium: 135 mmol/L (ref 135–145)
Total Bilirubin: 1.4 mg/dL — ABNORMAL HIGH (ref 0.3–1.2)
Total Protein: 6.6 g/dL (ref 6.5–8.1)

## 2017-11-19 LAB — URINALYSIS, ROUTINE W REFLEX MICROSCOPIC
Bilirubin Urine: NEGATIVE
Glucose, UA: >=500 mg/dL — AB
Hgb urine dipstick: NEGATIVE
Ketones, ur: NEGATIVE mg/dL
Nitrite: NEGATIVE
Protein, ur: 100 mg/dL — AB
Specific Gravity, Urine: 1.01 (ref 1.005–1.030)
pH: 6 (ref 5.0–8.0)

## 2017-11-19 LAB — CBG MONITORING, ED: Glucose-Capillary: 273 mg/dL — ABNORMAL HIGH (ref 65–99)

## 2017-11-19 NOTE — ED Triage Notes (Signed)
PT presents from home by EMS for evaluation of hyperglycemia along with nausea and vomiting. Pt reports administering 7 units of Novolog and Humalog prior to EMS arrival. EMS reports CBG of over 500. 500ML bolus NS given during transport.

## 2017-11-19 NOTE — ED Provider Notes (Signed)
[facility] DEPT Provider Note   CSN: [id number] Arrival date & time: 11/19/17  2134     History   Chief Complaint Chief Complaint  Patient presents with  . Hyperglycemia    HPI Elizabeth Olsen is a 77 y.o. female with history of anemia, left breast cancer, CHF, DM, HTN, HLD, hypothyroidism presents today with chief complaint acute onset, progressively improved right lower quadrant abdominal pain.  She states that approximately 1 hour prior to arrival she had sudden onset of crampy right lower quadrant abdominal pain which was relieved while having a bowel movement.  She states that she did have watery loose stools at that time and one episode of nausea and nonbloody nonbilious emesis.  She denies shortness of breath, chest pain, headache, polydipsia, or polyuria.  She denies any urinary symptoms.  She was found to have a CBG of over 500.  She self administered 8 units of Lantus and 8 units of NovoLog at that time.  EMS also administered 500 mL bolus of normal saline.   The history is provided by the patient.    Past Medical History:  Diagnosis Date  . Anemia   . Blood transfusion   . Blood transfusion without reported diagnosis    with breast surgery  . Breast cancer ([place]) 15 years ago   left   . CHF (congestive heart failure) ([place])   . 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 Diabetes mellitus   . Heart murmur    as child  . Hernia, incisional   . Hyperlipidemia   . Hypertension   . Hypothyroidism   . Vitamin D deficiency     Patient Active Problem List   Diagnosis Date Noted  . Anemia 11/07/2017  . Acute systolic heart failure ([place]) 10/13/2017  . Acute respiratory failure with hypoxia ([place]) 10/13/2017  . Hypothyroid 10/13/2017  . Anemia associated with diabetes mellitus ([place]) 10/13/2017  . Diabetes mellitus ([place]) 07/23/2017  . Hyperlipidemia 07/23/2017  . Iron deficiency 07/23/2017  . Cardiomyopathy ([place]) 07/23/2017    Past Surgical  History:  Procedure Laterality Date  . ABDOMINAL HYSTERECTOMY    . APPENDECTOMY    . COLONOSCOPY  2013   due next 03-2017  . IUD REMOVAL     with appendectomy  . MASTECTOMY, MODIFIED RADICAL W/RECONSTRUCTION Left 15 years ago   10 nodes out  . TUMOR EXCISION Left    x 2, neck, head    OB History    No data available       Home Medications    Prior to Admission medications   Medication Sig Start Date End Date Taking? Authorizing Provider  atorvastatin (LIPITOR) 10 MG tablet Take 1 tablet (10 mg total) by mouth daily. 07/25/17  Yes Park Liter, MD  calcium carbonate (OSCAL) 1500 (600 Ca) MG TABS tablet Take by mouth daily with breakfast.   Yes [provider]  carvedilol (COREG) 6.25 MG tablet Take 1 tablet (6.25 mg total) by mouth 2 (two) times daily. 08/10/17 11/19/17 Yes Park Liter, MD  fenofibrate 160 MG tablet Take 160 mg by mouth daily.   Yes [provider]  fish oil-omega-3 fatty acids 1000 MG capsule Take 2 g by mouth daily.    Yes [provider]  furosemide (LASIX) 40 MG tablet Take 1 tablet (40 mg total) by mouth daily. Patient taking differently: Take 60 mg by mouth daily.  10/14/17 10/14/18 Yes Bonnell Public, MD  HUMALOG KWIKPEN 100  UNIT/ML KiwkPen INJECT 5  UNITS WITH MEALS PLUS SLIDING SCALE (ISF 50, TARGET 100). 30 UNITS DAILY 05/17/15  Yes [provider]  iron polysaccharides (NIFEREX) 150 MG capsule Take 150 mg by mouth 2 (two) times daily.    Yes [provider]  LANTUS SOLOSTAR 100 UNIT/ML Solostar Pen Inject 7 units in the morning and 7 units in the evening 05/17/15  Yes [provider]  levothyroxine (SYNTHROID, LEVOTHROID) 112 MCG tablet Take 112 mcg by mouth daily before breakfast.   Yes [provider]  losartan (COZAAR) 100 MG tablet Take 100 mg by mouth See admin instructions. Patient taking one-half tablet (50mg ) daily   Yes [provider]  Menthol-Methyl  Salicylate (MUSCLE RUB) 10-15 % CREA Apply 1 application topically as needed for muscle pain.   Yes [provider]  Multiple Vitamins-Minerals (MULTIVITAMIN WITH MINERALS) tablet Take 1 tablet by mouth daily.   Yes [provider]  OVER THE COUNTER MEDICATION Take 5 mLs by mouth daily.    Yes [provider]  ranitidine (ZANTAC) 300 MG capsule Take 300 mg by mouth 2 (two) times daily.    Yes [provider]  vitamin C (ASCORBIC ACID) 500 MG tablet Take 500 mg by mouth daily.   Yes [provider]  Vitamin D, Ergocalciferol, (DRISDOL) 50000 UNITS CAPS Take 50,000 Units by mouth 2 (two) times a week.  02/20/12  Yes [provider]  ONETOUCH VERIO test strip USE TO TEST UP TO 10 TIMES D 08/24/17   [provider]    Family History Family History  Problem Relation Age of Onset  . Diabetes Other        both sides of family  . Hypertension Father   . Congestive Heart Failure Father   . Peripheral vascular disease Father   . Hypertension Maternal Grandfather   . Hypertension Maternal Grandmother   . Stomach cancer Maternal Grandmother        GGM  . Heart attack Brother   . Colon cancer Neg Hx   . Esophageal cancer Neg Hx   . Pancreatic cancer Neg Hx   . Rectal cancer Neg Hx     Social History Social History   Tobacco Use  . Smoking status: Never Smoker  . Smokeless tobacco: Never Used  Substance Use Topics  . Alcohol use: No  . Drug use: No     Allergies   Erythromycin; Penicillins; Nyquil [pseudoeph-doxylamine-dm-apap]; and Sulfa antibiotics   Review of Systems Review of Systems  Constitutional: Positive for chills. Negative for fever.  Respiratory: Negative for shortness of breath.   Cardiovascular: Negative for chest pain.  Gastrointestinal: Positive for abdominal pain, diarrhea, nausea and vomiting.  Endocrine: Negative for polyphagia and polyuria.  Genitourinary: Negative for dysuria, hematuria and  urgency.  Neurological: Negative for syncope, light-headedness and headaches.  All other systems reviewed and are negative.    Physical Exam Updated Vital Signs BP (!) 139/59   Pulse 83   Temp 97.8 F (36.6 C) (Oral)   Resp 16   Ht 5\' 2"  (1.575 m)   Wt 70.8 kg (156 lb)   SpO2 96%   BMI 28.53 kg/m   Physical Exam  Constitutional: She is oriented to person, place, and time. She appears well-developed and well-nourished. No distress.  HENT:  Head: Normocephalic and atraumatic.  Eyes: Conjunctivae and EOM are normal. Pupils are equal, round, and reactive to light. Right eye exhibits no discharge. Left eye exhibits no discharge.  Neck: Normal range of motion. Neck supple. No JVD present. No tracheal deviation present.  Cardiovascular: Normal rate, regular rhythm, normal heart sounds and intact distal pulses.  Pulmonary/Chest: Effort normal and breath sounds normal. No stridor. No respiratory distress. She has no wheezes. She has no rales. She exhibits no tenderness.  Equal rise and fall of chest, no increased work of breathing, speaking in full sentences without difficulty.  Abdominal: Soft. Bowel sounds are normal. She exhibits no distension. There is no tenderness. There is no guarding.  Musculoskeletal: Normal range of motion. She exhibits no edema or tenderness.  Neurological: She is alert and oriented to person, place, and time. No cranial nerve deficit or sensory deficit. She exhibits normal muscle tone.  Skin: Skin is warm and dry. No erythema.  Psychiatric: She has a normal mood and affect. Her behavior is normal.  Nursing note and vitals reviewed.    ED Treatments / Results  Labs (all labs ordered are listed, but only abnormal results are displayed) Labs Reviewed  COMPREHENSIVE METABOLIC PANEL - Abnormal; Notable for the following components:      Result Value   Chloride 98 (*)    Glucose, Bld 279 (*)    BUN 41 (*)    Creatinine, Ser 1.45 (*)    Calcium 8.7 (*)     AST 52 (*)    Alkaline Phosphatase 34 (*)    Total Bilirubin 1.4 (*)    GFR calc non Af Amer 34 (*)    GFR calc Af Amer 40 (*)    All other components within normal limits  CBC WITH DIFFERENTIAL/PLATELET - Abnormal; Notable for the following components:   WBC 13.9 (*)    RBC 3.19 (*)    Hemoglobin 9.3 (*)    HCT 27.8 (*)    Neutro Abs 11.3 (*)    Monocytes Absolute 1.6 (*)    All other components within normal limits  URINALYSIS, ROUTINE W REFLEX MICROSCOPIC - Abnormal; Notable for the following components:   APPearance HAZY (*)    Glucose, UA >=500 (*)    Protein, ur 100 (*)    Leukocytes, UA TRACE (*)    Bacteria, UA RARE (*)    Squamous Epithelial / LPF 0-5 (*)    All other components within normal limits  CBG MONITORING, ED - Abnormal; Notable for the following components:   Glucose-Capillary 273 (*)    All other components within normal limits  CBG MONITORING, ED - Abnormal; Notable for the following components:   Glucose-Capillary 293 (*)    All other components within normal limits  LIPASE, BLOOD    EKG  EKG Interpretation None       Radiology US Abdomen Limited Ruq  Result Date: 11/20/2017 CLINICAL DATA:  Abdominal pain with nausea and diarrhea EXAM: ULTRASOUND ABDOMEN LIMITED RIGHT UPPER QUADRANT COMPARISON:  CT 07/23/2014 FINDINGS: Gallbladder: Tumefactive sludge and/or nonshadowing stones are present. Negative sonographic Murphy. Normal wall thickness. Common bile duct: Diameter: 2.5 mm Liver: No focal lesion identified. Within normal limits in parenchymal echogenicity. Portal vein is patent on color Doppler imaging with normal direction of blood flow towards the liver. IMPRESSION: 1. Tumefactive sludge and/or nonshadowing small stones in the gallbladder. Negative for acute cholecystitis or biliary dilatation Electronically Signed   By: Donavan Foil M.D.   On: 11/20/2017 00:15    Procedures Procedures (including critical care time)  Medications Ordered in  ED Medications - No data to display   Initial Impression / Assessment and Plan /  ED Course  I have reviewed the triage vital signs and the nursing notes.  Pertinent labs & imaging results that were available during my care of the patient were reviewed by me and considered in my medical decision making (see chart for details).     Patient with hyperglycemia and acute onset, resolved episode of right sided abdominal pain. She had one episode of diarrhea and one episode of nonbloody nonbilious emesis prior to arrival.  Abdominal examination unremarkable.  CBG >500 at home, baseline around 180-200.  She self administered NovoLog and Lantus, 8 units each, prior to arrival and EMS administered 500 cc of normal saline.  CBG in the ED improved to 279.  She does have a leukocytosis as compared to lab work 12 days ago as well as elevated total bilirubin.  Creatinine has improved mildly. Anion gap within normal limits and no ketonuria, does not appear to be in DKA or HHS.  UA is not concerning for UTI but she does have gross glucosuria.  With right-sided abdominal pain and elevated total bilirubin, will obtain right upper quadrant ultrasound for further evaluation.  1:07 AM Ultrasound showed gallbladder sludge versus nonshadowing small stones in the gallbladder but no evidence of acute cholecystitis or biliary dilatation.  We will obtain CT scan of the abdomen for further evaluation.  Signed out to oncoming provider.  If CT scan shows no acute pathology, patient stable for discharge home with outpatient surgical follow-up for her gallbladder findings and PCP follow up for hyperglycemia.  Patient seen and evaluated by Dr. Ellender Hose who agrees with assessment and plan at this time.  Final Clinical Impressions(s) / ED Diagnoses   Final diagnoses:  Abdominal pain  Hyperglycemia    ED Discharge Orders    None      Debroah Baller 11/20/17 0112  Duffy Bruce, MD 11/20/17 1200

## 2017-11-19 NOTE — ED Notes (Signed)
Bed: UY23 Expected date:  Expected time:  Means of arrival:  Comments: EMS 77 yo female CBG 505 IVF's

## 2017-11-20 ENCOUNTER — Encounter (HOSPITAL_COMMUNITY): Payer: Self-pay

## 2017-11-20 ENCOUNTER — Emergency Department (HOSPITAL_COMMUNITY): Payer: Medicare Other

## 2017-11-20 DIAGNOSIS — R1031 Right lower quadrant pain: Secondary | ICD-10-CM | POA: Diagnosis not present

## 2017-11-20 DIAGNOSIS — E1122 Type 2 diabetes mellitus with diabetic chronic kidney disease: Secondary | ICD-10-CM | POA: Diagnosis not present

## 2017-11-20 DIAGNOSIS — I10 Essential (primary) hypertension: Secondary | ICD-10-CM | POA: Diagnosis not present

## 2017-11-20 DIAGNOSIS — R197 Diarrhea, unspecified: Secondary | ICD-10-CM | POA: Diagnosis not present

## 2017-11-20 DIAGNOSIS — K8020 Calculus of gallbladder without cholecystitis without obstruction: Secondary | ICD-10-CM | POA: Diagnosis not present

## 2017-11-20 DIAGNOSIS — D649 Anemia, unspecified: Secondary | ICD-10-CM | POA: Diagnosis not present

## 2017-11-20 DIAGNOSIS — I509 Heart failure, unspecified: Secondary | ICD-10-CM | POA: Diagnosis not present

## 2017-11-20 DIAGNOSIS — N183 Chronic kidney disease, stage 3 (moderate): Secondary | ICD-10-CM | POA: Diagnosis not present

## 2017-11-20 DIAGNOSIS — Z794 Long term (current) use of insulin: Secondary | ICD-10-CM | POA: Diagnosis not present

## 2017-11-20 DIAGNOSIS — R109 Unspecified abdominal pain: Secondary | ICD-10-CM | POA: Diagnosis not present

## 2017-11-20 LAB — CBG MONITORING, ED: Glucose-Capillary: 293 mg/dL — ABNORMAL HIGH (ref 65–99)

## 2017-11-20 LAB — LIPASE, BLOOD: Lipase: 20 U/L (ref 11–51)

## 2017-11-20 MED ORDER — SODIUM CHLORIDE 0.9 % IV BOLUS (SEPSIS)
500.0000 mL | Freq: Once | INTRAVENOUS | Status: AC
  Administered 2017-11-20: 500 mL via INTRAVENOUS

## 2017-11-20 MED ORDER — IOPAMIDOL (ISOVUE-300) INJECTION 61%
100.0000 mL | Freq: Once | INTRAVENOUS | Status: AC | PRN
  Administered 2017-11-20: 75 mL via INTRAVENOUS

## 2017-11-20 MED ORDER — IOPAMIDOL (ISOVUE-300) INJECTION 61%
INTRAVENOUS | Status: AC
  Administered 2017-11-20: 75 mL via INTRAVENOUS
  Filled 2017-11-20: qty 100

## 2017-11-20 NOTE — ED Notes (Signed)
Pt returned from CT scan via stretcher

## 2017-11-20 NOTE — ED Provider Notes (Signed)
Signout from [facility], PA-C at shift change See previous provider's note for full H&P  Briefly, patient presenting with hyperglycemia and self resolved episode of right upper and right lower quadrant pain.  No abdominal tenderness on exam, however considering course of symptoms and patient's age, CT abdomen pelvis was ordered and is pending at time of shift change.    If CT of the pelvis is negative, have patient follow-up with general surgery and PCP, as right upper quadrant ultrasound did show cholelithiasis and symptoms could have been related to biliary colic.  CT abdomen pelvis confirms cholelithiasis, however no other acute findings.  Patient is feeling well.  Blood glucose decreased in the ED with insulin and small fluid bolus.  Patient advised to follow-up as above.  She understands and agrees with plan.  Return precautions discussed.  Patient vitals stable throughout ED course and discharged in satisfactory condition.   Frederica Kuster, PA-C 11/20/17 0071    Duffy Bruce, MD 11/20/17 1200

## 2017-11-20 NOTE — Discharge Instructions (Addendum)
Continue taking your home medications as prescribed.  Follow-up with general surgery for reevaluation of your symptoms.  Your gallbladder ultrasound showed some abnormalities but no evidence of gallbladder infection.  Continue taking your diabetes medications and checking your blood sugar at home.  Follow-up with your primary care physician for reevaluation of your high blood sugars.  Return to the emergency department if any concerning signs or symptoms develop such as fevers, inability to tolerate food or fluids, altered mental status, chest pain, shortness of breath, or blood in the urine or stool.

## 2017-11-21 ENCOUNTER — Inpatient Hospital Stay (HOSPITAL_BASED_OUTPATIENT_CLINIC_OR_DEPARTMENT_OTHER): Payer: Medicare Other | Admitting: Hematology

## 2017-11-21 ENCOUNTER — Inpatient Hospital Stay: Payer: Medicare Other

## 2017-11-21 VITALS — BP 149/74 | HR 73 | Temp 98.2°F | Resp 16

## 2017-11-21 DIAGNOSIS — Z853 Personal history of malignant neoplasm of breast: Secondary | ICD-10-CM | POA: Diagnosis not present

## 2017-11-21 DIAGNOSIS — Z800 Family history of malignant neoplasm of digestive organs: Secondary | ICD-10-CM | POA: Diagnosis not present

## 2017-11-21 DIAGNOSIS — E119 Type 2 diabetes mellitus without complications: Secondary | ICD-10-CM | POA: Diagnosis not present

## 2017-11-21 DIAGNOSIS — D649 Anemia, unspecified: Secondary | ICD-10-CM

## 2017-11-21 DIAGNOSIS — I509 Heart failure, unspecified: Secondary | ICD-10-CM | POA: Diagnosis not present

## 2017-11-21 DIAGNOSIS — N189 Chronic kidney disease, unspecified: Secondary | ICD-10-CM | POA: Diagnosis not present

## 2017-11-21 DIAGNOSIS — D7589 Other specified diseases of blood and blood-forming organs: Secondary | ICD-10-CM | POA: Diagnosis not present

## 2017-11-21 LAB — CBC WITH DIFFERENTIAL (CANCER CENTER ONLY)
BASOS PCT: 0 %
Basophils Absolute: 0 10*3/uL (ref 0.0–0.1)
Eosinophils Absolute: 0.2 10*3/uL (ref 0.0–0.5)
Eosinophils Relative: 3 %
HEMATOCRIT: 26.4 % — AB (ref 34.8–46.6)
HEMOGLOBIN: 8.5 g/dL — AB (ref 11.6–15.9)
Lymphocytes Relative: 27 %
Lymphs Abs: 2 10*3/uL (ref 0.9–3.3)
MCH: 28.6 pg (ref 25.1–34.0)
MCHC: 32.2 g/dL (ref 31.5–36.0)
MCV: 88.9 fL (ref 79.5–101.0)
MONOS PCT: 9 %
Monocytes Absolute: 0.7 10*3/uL (ref 0.1–0.9)
Neutro Abs: 4.4 10*3/uL (ref 1.5–6.5)
Neutrophils Relative %: 61 %
Platelet Count: 350 10*3/uL (ref 145–400)
RBC: 2.97 MIL/uL — AB (ref 3.70–5.45)
RDW: 13.2 % (ref 11.2–16.1)
WBC Count: 7.2 10*3/uL (ref 3.9–10.3)

## 2017-11-21 NOTE — Progress Notes (Addendum)
INDICATION: anemia  Bone Marrow Biopsy and Aspiration Procedure Note   Informed consent was obtained and potential risks including bleeding, infection and pain were reviewed with the patient.  The patient's name, date of birth, identification, consent and allergies were verified prior to the start of procedure and time out was performed.  The right posterior iliac crest was chosen as the site of biopsy.  The skin was prepped with ChloraPrep.   10 cc of 2% lidocaine was used to provide local anaesthesia.   10 cc of bone marrow aspirate was obtained followed by 1cm biopsy.  Pressure was applied to the biopsy site and bandage was placed over the biopsy site. Patient was made to lie on the back for 15 mins prior to discharge.  The procedure was tolerated well. COMPLICATIONS: None BLOOD LOSS: none The patient was discharged home in stable condition with a 1 week follow up to review results.  Patient was provided with post bone marrow biopsy instructions and instructed to call if there was any bleeding or worsening pain.  Specimens sent for flow cytometry, cytogenetics and additional studies.  Signed Scot Dock, NP   Addendum  I supervised and presented during the entire bone marrow biopsy procedure.   Truitt Merle  11/22/2017

## 2017-11-27 DIAGNOSIS — Z794 Long term (current) use of insulin: Secondary | ICD-10-CM | POA: Diagnosis not present

## 2017-11-27 DIAGNOSIS — E1149 Type 2 diabetes mellitus with other diabetic neurological complication: Secondary | ICD-10-CM | POA: Diagnosis not present

## 2017-11-27 DIAGNOSIS — Z6827 Body mass index (BMI) 27.0-27.9, adult: Secondary | ICD-10-CM | POA: Diagnosis not present

## 2017-11-27 DIAGNOSIS — I10 Essential (primary) hypertension: Secondary | ICD-10-CM | POA: Diagnosis not present

## 2017-11-29 ENCOUNTER — Ambulatory Visit: Payer: Medicare Other | Admitting: Cardiology

## 2017-12-03 ENCOUNTER — Ambulatory Visit: Payer: Self-pay | Admitting: Surgery

## 2017-12-03 DIAGNOSIS — I255 Ischemic cardiomyopathy: Secondary | ICD-10-CM | POA: Diagnosis not present

## 2017-12-03 DIAGNOSIS — I509 Heart failure, unspecified: Secondary | ICD-10-CM | POA: Diagnosis not present

## 2017-12-03 DIAGNOSIS — K8010 Calculus of gallbladder with chronic cholecystitis without obstruction: Secondary | ICD-10-CM | POA: Diagnosis not present

## 2017-12-03 NOTE — H&P (Signed)
History of Present Illness Elizabeth Olsen. Elizabeth Witz MD; 12/03/2017 11:26 AM) The patient is a 77 year old female who presents for evaluation of gall stones. Referred by Dr. Marijo File for symptomatic gallstones Cardiology - Dr. Agustin Cree  [name] with multiple medical issues including ischemic cardiomyopathy, congestive heart failure, history of left breast cancer status post mastectomy with TRAM flap reconstruction who presents after recent ER visit.  On 11/19/17, the patient experienced sudden onset of nausea, vomiting, diarrhea, and right upper quadrant abdominal pain. She was evaluated in the emergency department and was noted to have gallstones. Liver function tests showed total bilirubin of 1.4, alkaline phosphatase 34, ALT 14, AST 52. Her symptoms resolved spontaneously. She has not had any similar episodes since that time. She does not remember any recent symptoms similar to that day. She is now referred for surgical evaluation.  In 1998 patient had a left mastectomy with TRAM flap. Apparently she developed an incisional hernia on the left and had this repair. She is also had a hysterectomy through a lower midline incision.  CLINICAL DATA: Abdominal pain with nausea and diarrhea  EXAM: ULTRASOUND ABDOMEN LIMITED RIGHT UPPER QUADRANT  COMPARISON: CT 07/23/2014  FINDINGS: Gallbladder:  Tumefactive sludge and/or nonshadowing stones are present. Negative sonographic Murphy. Normal wall thickness.  Common bile duct:  Diameter: 2.5 mm  Liver:  No focal lesion identified. Within normal limits in parenchymal echogenicity. Portal vein is patent on color Doppler imaging with normal direction of blood flow towards the liver.  IMPRESSION: 1. Tumefactive sludge and/or nonshadowing small stones in the gallbladder. Negative for acute cholecystitis or biliary dilatation   Electronically Signed By: Donavan Foil M.D. On: 11/20/2017 00:15  CLINICAL DATA:  Right lower quadrant pain and cramping during a bowel movement. Nausea. Elevated blood sugar. White cell count 13.9. History of left breast cancer, diabetes, hypertension, incisional hernia, appendectomy, and hysterectomy.  EXAM: CT ABDOMEN AND PELVIS WITH CONTRAST  TECHNIQUE: Multidetector CT imaging of the abdomen and pelvis was performed using the standard protocol following bolus administration of intravenous contrast.  CONTRAST: 75 mL Isovue 300  COMPARISON: 07/23/2014  FINDINGS: Lower chest: Dependent atelectasis in the lung bases.  Hepatobiliary: Cholelithiasis with small stones in the gallbladder. No inflammatory changes appreciated. No bile duct dilatation. Homogeneous liver parenchyma. No focal lesions.  Pancreas: Unremarkable. No pancreatic ductal dilatation or surrounding inflammatory changes.  Spleen: Normal in size without focal abnormality.  Adrenals/Urinary Tract: Adrenal glands are unremarkable. Kidneys are normal, without renal calculi, focal lesion, or hydronephrosis. Bladder is unremarkable.  Stomach/Bowel: Stomach, small bowel, and colon are mostly decompressed. No inflammatory stranding is identified. Appendix is surgically absent.  Vascular/Lymphatic: Aortic atherosclerosis. No enlarged abdominal or pelvic lymph nodes.  Reproductive: Status post hysterectomy. No adnexal masses.  Other: Scattered surgical clips in the abdomen and abdominal wall. Abdominal wall musculature appears intact. No free air or free fluid in the abdomen.  Musculoskeletal: Degenerative changes in the spine. No destructive bone lesions.  IMPRESSION: 1. No acute process demonstrated in the abdomen or pelvis. No evidence of bowel obstruction or inflammation. 2. Cholelithiasis without changes of cholecystitis. 3. Aortic atherosclerosis.   Electronically Signed By: Lucienne Capers M.D. On: 11/20/2017 02:07    Past Surgical History (Janette Ranson, CMA;  12/03/2017 10:24 AM) Appendectomy Breast Biopsy Left. Breast Reconstruction Left. Cataract Surgery Left. Colon Polyp Removal - Colonoscopy Hysterectomy (not due to cancer) - Partial Mastectomy Left.  Diagnostic Studies History (Janette Ranson, CMA; 12/03/2017 10:24 AM) Colonoscopy within last year  Mammogram 1-3 years ago Pap Smear 1-5 years ago  Allergies (Janette Ranson, CMA; 12/03/2017 10:29 AM) NyQuil *COUGH/COLD/ALLERGY* Penicillin G Benzathine & Proc *PENICILLINS* Sulfa Antibiotics  Medication History (Janette Ranson, CMA; 12/03/2017 10:32 AM) Atorvastatin Calcium (10MG  Tablet, Oral) Active. Carvedilol (6.25MG  Tablet, Oral) Active. Fenofibrate (160MG  Tablet, Oral) Active. Levothyroxine Sodium (112MCG Tablet, Oral) Active. Losartan Potassium (100MG  Tablet, Oral) Active. Lantus SoloStar (100UNIT/ML Soln Pen-inj, Subcutaneous) Active. Furosemide (40MG  Tablet, Oral) Active. OneTouch Verio (In Vitro) Active. Vitamin D (1000UNIT Tablet, Oral) Active. RaNITidine HCl (300MG  Tablet, Oral) Active. Calcium (500MG  Tablet, Oral) Active. Ascorbic Acid (250MG  Tablet Chewable, Oral) Active. Medications Reconciled  Social History (Janette Ranson, CMA; 12/03/2017 10:24 AM) Caffeine use Carbonated beverages, Tea. No alcohol use No drug use Tobacco use Never smoker.  Family History (Janette Ranson, CMA; 12/03/2017 10:24 AM) Alcohol Abuse Brother, Mother, Son. Cerebrovascular Accident Father. Diabetes Mellitus Brother, Sister. Heart Disease Brother, Father. Hypertension Brother, Father, Sister, Son. Thyroid problems Sister.  Pregnancy / Birth History (Janette Ranson, CMA; 12/03/2017 10:24 AM) Age at menarche 9 years. Age of menopause <46 Contraceptive History Contraceptive implant, Depo-provera, Intrauterine device, Oral contraceptives. Gravida 3 Maternal age 77-20 Para 3  Other Problems (Janette Ranson, CMA; 12/03/2017 10:24 AM) Bladder  Problems Breast Cancer Cholelithiasis Congestive Heart Failure Diabetes Mellitus Heart murmur Hemorrhoids High blood pressure Hypercholesterolemia Kidney Stone Lump In Breast Other disease, cancer, significant illness Thyroid Disease Ventral Hernia Repair     Review of Systems (Janette Ranson CMA; 12/03/2017 10:24 AM) General Present- Fatigue. Not Present- Appetite Loss, Chills, Fever, Night Sweats, Weight Gain and Weight Loss. Skin Present- Change in Wart/Mole. Not Present- Dryness, Hives, Jaundice, New Lesions, Non-Healing Wounds, Rash and Ulcer. HEENT Present- Wears glasses/contact lenses. Not Present- Earache, Hearing Loss, Hoarseness, Nose Bleed, Oral Ulcers, Ringing in the Ears, Seasonal Allergies, Sinus Pain, Sore Throat, Visual Disturbances and Yellow Eyes. Respiratory Not Present- Bloody sputum, Chronic Cough, Difficulty Breathing, Snoring and Wheezing. Cardiovascular Present- Leg Cramps and Swelling of Extremities. Not Present- Chest Pain, Difficulty Breathing Lying Down, Palpitations, Rapid Heart Rate and Shortness of Breath. Gastrointestinal Present- Change in Bowel Habits and Hemorrhoids. Not Present- Abdominal Pain, Bloating, Bloody Stool, Chronic diarrhea, Constipation, Difficulty Swallowing, Excessive gas, Gets full quickly at meals, Indigestion, Nausea, Rectal Pain and Vomiting. Female Genitourinary Present- Frequency, Nocturia and Urgency. Not Present- Painful Urination and Pelvic Pain. Musculoskeletal Not Present- Back Pain, Joint Pain, Joint Stiffness, Muscle Pain, Muscle Weakness and Swelling of Extremities. Neurological Not Present- Decreased Memory, Fainting, Headaches, Numbness, Seizures, Tingling, Tremor, Trouble walking and Weakness. Psychiatric Not Present- Anxiety, Bipolar, Change in Sleep Pattern, Depression, Fearful and Frequent crying. Endocrine Not Present- Cold Intolerance, Excessive Hunger, Hair Changes, Heat Intolerance, Hot flashes and New  Diabetes. Hematology Not Present- Blood Thinners, Easy Bruising, Excessive bleeding, Gland problems, HIV and Persistent Infections.  Vitals (Janette Ranson CMA; 12/03/2017 10:27 AM) 12/03/2017 10:25 AM Weight: 156.8 lb Height: 66in Body Surface Area: 1.8 m Body Mass Index: 25.31 kg/m  Pulse: 83 (Regular)  BP: 170/80 (Sitting, Left Arm, Standard)      Physical Exam Rodman Key K. Gregorio Worley MD; 12/03/2017 11:27 AM)  The physical exam findings are as follows: Note:WDWN in NAD Eyes: Pupils equal, round; sclera anicteric HENT: Oral mucosa moist; good dentition Neck: No masses palpated, no thyromegaly Chest: healed left mastectomy scar with reconstruction Lungs: CTA bilaterally; normal respiratory effort CV: Regular rate and rhythm; no murmurs; extremities well-perfused with no edema Abd: +bowel sounds, soft, non-tender, no palpable organomegaly; no palpable hernias; healed lower midline incisioin Skin:  Warm, dry; no sign of jaundice Psychiatric - alert and oriented x 4; calm mood and affect    Assessment & Plan Rodman Key K. Heidee Audi MD; 12/03/2017 11:11 AM)  CHRONIC CHOLECYSTITIS WITH CALCULUS (K80.10)   ISCHEMIC CARDIOMYOPATHY (I25.5)   CONGESTIVE HEART FAILURE (I50.9)  Current Plans Schedule for Surgery - Laparoscopic cholecystectomy with intraoperative cholangiogram. The surgical procedure has been discussed with the patient. Potential risks, benefits, alternative treatments, and expected outcomes have been explained. All of the patient's questions at this time have been answered. The likelihood of reaching the patient's treatment goal is good. The patient understand the proposed surgical procedure and wishes to proceed.  Cardiac clearance first by Dr. Derry Lory. Georgette Dover, MD, [facility]  General/ Trauma Surgery  12/03/2017 11:27 AM

## 2017-12-05 DIAGNOSIS — Z6827 Body mass index (BMI) 27.0-27.9, adult: Secondary | ICD-10-CM | POA: Diagnosis not present

## 2017-12-05 DIAGNOSIS — B351 Tinea unguium: Secondary | ICD-10-CM | POA: Diagnosis not present

## 2017-12-05 DIAGNOSIS — M79609 Pain in unspecified limb: Secondary | ICD-10-CM | POA: Diagnosis not present

## 2017-12-05 DIAGNOSIS — E1149 Type 2 diabetes mellitus with other diabetic neurological complication: Secondary | ICD-10-CM | POA: Diagnosis not present

## 2017-12-10 ENCOUNTER — Encounter: Payer: Self-pay | Admitting: Hematology

## 2017-12-10 DIAGNOSIS — D638 Anemia in other chronic diseases classified elsewhere: Secondary | ICD-10-CM

## 2017-12-10 DIAGNOSIS — D464 Refractory anemia, unspecified: Secondary | ICD-10-CM

## 2017-12-10 HISTORY — DX: Refractory anemia, unspecified: D46.4

## 2017-12-10 HISTORY — DX: Anemia in other chronic diseases classified elsewhere: D63.8

## 2017-12-19 ENCOUNTER — Encounter (HOSPITAL_COMMUNITY): Payer: Self-pay

## 2017-12-21 LAB — CHROMOSOME ANALYSIS, BONE MARROW

## 2017-12-24 ENCOUNTER — Telehealth: Payer: Self-pay | Admitting: Hematology

## 2017-12-24 NOTE — Telephone Encounter (Signed)
Patient came into scheduling office to R/S her appt.  Appointment moved per her request

## 2017-12-24 NOTE — Progress Notes (Signed)
[place]  Telephone:(336) [phone] Fax:(336) [phone]  Clinic Follow Up Note   Patient Care Team: [name], MD as PCP - General (Family Medicine) Reynold Bowen, MD as Consulting Physician (Endocrinology) 12/26/2017  CHIEF COMPLAINTS:  F/u anemia of chronic disease   HISTORY OF PRESENTING ILLNESS (11/07/2017):  Elizabeth Olsen 77 y.o. female is here because of  anemia. She was referred by Dr. Alyson Ingles at [place] for her worsening microcytic hypochromic anemia despite iron supplementation. Her Hgb was 9, iron sat 18% and her iron level was 58 per most recent labs on 10/09/17. Pt states she has been diagnosed as anemic for over a year. Pt reports increasing fatigue over the past year and states doing errands and chores is sometimes difficult. Pt has some leg swelling that she treats with Lasix. Pt denies night sweats.   Pt has a PMHx of CHF, DM, Breast CA. She is treated by a cardiologist, Dr. Agustin Cree and her DM is monitored by her PCP, Dr. Alyson Ingles. Pt's CA was treated by Dr. Benay Spice in 1998. Pt underwent a left breast mastectomy and chemotherapy. She does not currently take any AEOT. She does get yearly screening mammograms. No other Fhx of CA. No tobacco use.   On review of systems, pt denies fever, chills, rash, mouth sores, decreased appetite, urinary complaints. Denies pain. Pt denies abdominal pain, nausea, vomiting. Pertinent positives are listed and detailed within the above HPI.   CURRENT THERAPY: pending Aranesp injection monthly starting 01/01/18  INTERVAL HISTORY:  Elizabeth Olsen is here for follow up after her bone marrow biopsy. She presents tot he clinic today accompanied by her daughter. She reports continued fatigue that is stable. She states she is having a cholecystectomy on 01/22/18 and she has seen a cardiologist to be cleared for surgery. She takes 81 mg ASA daily.   On review of systems, pt denies abnormal bleeding, or any other complaints  at this time. Pertinent positives are listed and detailed within the above HPI.   MEDICAL HISTORY:  Past Medical History:  Diagnosis Date  . Anemia   . Blood transfusion   . Blood transfusion without reported diagnosis    with breast surgery  . Breast cancer ([place]) 15 years ago   left   . CHF (congestive heart failure) ([place])   . 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 Diabetes mellitus   . Heart murmur    as child  . Hernia, incisional   . Hyperlipidemia   . Hypertension   . Hypothyroidism   . Vitamin D deficiency     SURGICAL HISTORY: Past Surgical History:  Procedure Laterality Date  . ABDOMINAL HYSTERECTOMY    . APPENDECTOMY    . COLONOSCOPY  2013   due next 03-2017  . IUD REMOVAL     with appendectomy  . MASTECTOMY, MODIFIED RADICAL W/RECONSTRUCTION Left 15 years ago   10 nodes out  . TUMOR EXCISION Left    x 2, neck, head    SOCIAL HISTORY: Social History   Socioeconomic History  . Marital status: Married    Spouse name: Not on file  . Number of children: 3  . Years of education: Not on file  . Highest education level: Not on file  Social Needs  . Financial resource strain: Not on file  . Food insecurity - worry: Not on file  . Food insecurity - inability: Not on file  . Transportation needs - medical: Not on file  .  Transportation needs - non-medical: Not on file  Occupational History  . Occupation: homemaker  Tobacco Use  . Smoking status: Never Smoker  . Smokeless tobacco: Never Used  Substance and Sexual Activity  . Alcohol use: No  . Drug use: No  . Sexual activity: Not on file  Other Topics Concern  . Not on file  Social History Narrative  . Not on file    FAMILY HISTORY: Family History  Problem Relation Age of Onset  . Diabetes Other        both sides of family  . Hypertension Father   . Congestive Heart Failure Father   . Peripheral vascular disease Father   . Hypertension Maternal Grandfather   . Hypertension Maternal Grandmother     . Stomach cancer Maternal Grandmother        GGM  . Heart attack Brother   . Colon cancer Neg Hx   . Esophageal cancer Neg Hx   . Pancreatic cancer Neg Hx   . Rectal cancer Neg Hx     ALLERGIES:  is allergic to erythromycin; penicillins; nyquil [pseudoeph-doxylamine-dm-apap]; and sulfa antibiotics.  MEDICATIONS:  Current Outpatient Medications  Medication Sig Dispense Refill  . atorvastatin (LIPITOR) 10 MG tablet Take 1 tablet (10 mg total) by mouth daily. 30 tablet 11  . calcium carbonate (OSCAL) 1500 (600 Ca) MG TABS tablet Take by mouth daily with breakfast.    . carvedilol (COREG) 6.25 MG tablet Take 1 tablet (6.25 mg total) by mouth 2 (two) times daily. 60 tablet 11  . fenofibrate 160 MG tablet Take 160 mg by mouth daily.    . fish oil-omega-3 fatty acids 1000 MG capsule Take 2 g by mouth daily.     . furosemide (LASIX) 20 MG tablet Take 3 tablets (60 mg total) by mouth daily. 270 tablet 3  . HUMALOG KWIKPEN 100 UNIT/ML KiwkPen INJECT 5  UNITS WITH MEALS PLUS SLIDING SCALE (ISF 50, TARGET 100). 30 UNITS DAILY  2  . iron polysaccharides (NIFEREX) 150 MG capsule Take 150 mg by mouth 2 (two) times daily.     Marland Kitchen LANTUS SOLOSTAR 100 UNIT/ML Solostar Pen Inject 7 units in the morning and 7 units in the evening  2  . levothyroxine (SYNTHROID, LEVOTHROID) 112 MCG tablet Take 112 mcg by mouth daily before breakfast.    . losartan (COZAAR) 100 MG tablet Take 100 mg by mouth See admin instructions. Patient taking one-half tablet (55m) daily    . Menthol-Methyl Salicylate (MUSCLE RUB) 10-15 % CREA Apply 1 application topically as needed for muscle pain.    . Multiple Vitamins-Minerals (MULTIVITAMIN WITH MINERALS) tablet Take 1 tablet by mouth daily.    .Glory RosebushVERIO test strip USE TO TEST UP TO 10 TIMES D  12  . OVER THE COUNTER MEDICATION Take 5 mLs by mouth daily.     . ranitidine (ZANTAC) 300 MG capsule Take 300 mg by mouth 2 (two) times daily.     . vitamin C (ASCORBIC ACID) 500 MG  tablet Take 500 mg by mouth daily.    . Vitamin D, Ergocalciferol, (DRISDOL) 50000 UNITS CAPS Take 50,000 Units by mouth 2 (two) times a week.      No current facility-administered medications for this visit.     REVIEW OF SYSTEMS:   Constitutional: Denies fevers, chills or abnormal night sweats (+) fatigue Eyes: Denies blurriness of vision, double vision or watery eyes Ears, nose, mouth, throat, and face: Denies mucositis or sore throat Respiratory: Denies  cough, dyspnea or wheezes Cardiovascular: Denies palpitation, chest discomfort (+) lower extremity swelling Gastrointestinal:  Denies nausea, heartburn or change in bowel habits Skin: Denies abnormal skin rashes Lymphatics: Denies new lymphadenopathy or easy bruising Neurological:Denies numbness, tingling or new weaknesses Behavioral/Psych: Mood is stable, no new changes  All other systems were reviewed with the patient and are negative.  PHYSICAL EXAMINATION: ECOG PERFORMANCE STATUS: 1 - Symptomatic but completely ambulatory  Vitals:   12/26/17 1017  BP: (!) 146/65  Pulse: 80  Resp: 18  Temp: 97.6 F (36.4 C)  SpO2: 100%   Filed Weights   12/26/17 1017  Weight: 153 lb 14.4 oz (69.8 kg)    GENERAL:alert, no distress and comfortable SKIN: skin color, texture, turgor are normal, no rashes or significant lesions EYES: normal, conjunctiva are pink and non-injected, sclera clear OROPHARYNX:no exudate, no erythema and lips, buccal mucosa, and tongue normal  NECK: supple, thyroid normal size, non-tender, without nodularity LYMPH:  no palpable lymphadenopathy in the cervical, axillary or inguinal LUNGS: clear to auscultation and percussion with normal breathing effort HEART: regular rate & rhythm and no murmurs and no lower extremity edema ABDOMEN:abdomen soft, non-tender and normal bowel sounds Musculoskeletal:no cyanosis of digits and no clubbing  PSYCH: alert & oriented x 3 with fluent speech NEURO: no focal motor/sensory  deficits  LABORATORY DATA:  I have reviewed the data as listed CBC Latest Ref Rng & Units 12/26/2017 11/21/2017 11/19/2017  WBC 3.9 - 10.3 K/uL 7.7 7.2 13.9(H)  Hemoglobin 12.0 - 15.0 g/dL - - 9.3(L)  Hematocrit 34.8 - 46.6 % 27.1(L) 26.4(L) 27.8(L)  Platelets 145 - 400 K/uL 356 350 379    CMP Latest Ref Rng & Units 12/26/2017 11/19/2017 11/07/2017  Glucose 70 - 140 mg/dL 155(H) 279(H) 106  BUN 7 - 26 mg/dL 33(H) 41(H) 34(H)  Creatinine 0.60 - 1.10 mg/dL 1.30(H) 1.45(H) 1.53(H)  Sodium 136 - 145 mmol/L 137 135 140  Potassium 3.5 - 5.1 mmol/L 5.2(H) 4.4 3.8  Chloride 98 - 109 mmol/L 97(L) 98(L) 99  CO2 22 - 29 mmol/L 31(H) 28 31(H)  Calcium 8.4 - 10.4 mg/dL 9.7 8.7(L) 9.5  Total Protein 6.4 - 8.3 g/dL 6.9 6.6 7.0  Total Bilirubin 0.2 - 1.2 mg/dL 0.4 1.4(H) 0.4  Alkaline Phos 40 - 150 U/L 47 34(L) 42  AST 5 - 34 U/L 23 52(H) 29  ALT 0 - 55 U/L '14 14 14   ' PROCEDURE: Colonoscopy 08/09/17 FINDINGS - The perianal and digital rectal examinations were normal. - A 7 mm polyp was found in the descending colon. The polyp was sessile. The polyp was removed with a cold snare. Resection and retrieval were complete. - Internal hemorrhoids were found during retroflexion. The hemorrhoids were small and Grade I (internal hemorrhoids that do not prolapse). - The exam was otherwise without abnormality on direct and retroflexion views.  PATHOLOGY  Diagnosis 11/21/17 Bone Marrow, Aspirate,Biopsy, and Clot - HYPERCELLULAR BONE MARROW WITH GRANULOCYTIC HYPERPLASIA. - SEVERAL LYMPHOID AGGREGATES PRESENT. - SEE COMMENT. PERIPHERAL BLOOD: - NORMOCYTIC-NORMOCHROMIC ANEMIA. Diagnosis Note The bone marrow is hypercellular for age with granulocytic hyperplasia. Significant dyspoiesis is not present in myeloid cell lines and hence the changes are likely secondary in nature. In this background, there are several interstitial lymphoid aggregates mostly composed of small lymphoid cells. Immunohistochemical  stains and flow cytometric analysis failed to show any phenotypic abnormalities most consistent with a reactive lymphoid process. Correlation with cytogenetic studies is recommended. (BNS:gt, 12/08/17) Interpretation 11/21/17 Bone Marrow Flow Cytometry - NO  MONOCLONAL B-CELL POPULATION OR ABNORMAL T-CELL PHENOTYPE IDENTIFIED.  Cytogenetic Analysis 11/21/17 Normal: Cytogenetic analysis revealed the presence of normal female chromosomes with no observable clonal chromosomal abnormalities.   RADIOGRAPHIC STUDIES: I have personally reviewed the radiological images as listed and agreed with the findings in the report. No results found.  ASSESSMENT & PLAN:  No problem-specific Assessment & Plan notes found for this encounter.  1. Anemia of chronic diseases  -I have reviewed her previous CBC and anemia workup, she had normal ferritin, iron study, C94 and folic acid in 7096, ret was low, normal MCV.  No lab evidence for hemolysis or multiple myeloma.  Her erythropoietin level was normal. -She does have chronic disease including uncontrolled diabetes, congestive heart failure, and mild CKD, which can cause anemia of chronic disease --Pt underwent a bone marrow biopsy in 11/21/17 with Mendel Ryder.  I discussed her bone marrow biopsy with pathologist Dr. Gari Crown, she does have hypercellular bone marrow, with granulocytic hyperplasia, no evidence of dysplasia.  We feel her pulmonary change is likely secondary, no evidence of MDS or other primary marrow disease.  I think this is consistent with anemia of chronic disease.  -Labs reviewed today (12/26/17), her Hgb is 8.9, HCT is 27.1 and her RBC is 3.07.  -Since her Hgb is below 9 most of time, I recommend she have Aranesp injection monthly. I discussed the benefit (improve her anemia) and potential side effects, especially risk of thrombosis, stroke and heart attack. I don't think she needs this urgently since her Hgb is 8.9 today. I discussed that we can watch her and  start this some time this year if her anemia does not improve. -She is scheduled for a cholecystectomy in 1 month. I informed her that her anemia may become worse after surgery due to loss of blood so it would benefit her to start Aranesp injection before then.  She understands her options and she agrees with this. She knows to call if she has any trouble with the injection.  -Scheduled Aranesp injection for 1 week -F/u in 2 months injection appointment.  2. CHF -Pt sees a cardiologist, Dr. Agustin Cree regularly. She takes 40 mg Lasix   3. DM - Follow up with PCP, Dr. Alyson Ingles for monitoring -I advised her that if her DM is well controlled, this will improve her other comorbidities.   4. Hyperkalemia  -Her K is 5.2 today (12/26/17), she does not take a K supplement but she will check her multivitamin to see if it contains K, if so she will hold.  -I advised her on foods to avoid with high amount of K  5. Stage III CKD -Likely secondary to her diabetes.   -Her cr is 1.3 and her GFRs in in 40's. I advised her to drink plenty of fluids and to control her diabetes.   PLAN: -I discussed her bone marrow biopsy results with patient and her daughter.   -Aranesp injection start on 01/01/18, 185mg sq -F/u in 2 months with lab and injection  -Cholecystectomy on 01/22/18 with Dr. TGeorgette Dover I will check pre- op labs  -Avoid K rich foods   All questions were answered. The patient knows to call the clinic with any problems, questions or concerns . I spent 20 counseling the patient face to face. The total time spent in the appointment was 25 and more than 50% was on counseling.   This document serves as a record of services personally performed by YTruitt Merle MD. It was created on her  behalf by Theresia Bough, a trained medical scribe. The creation of this record is based on the scribe's personal observations and the provider's statements to them.   I have reviewed the above documentation for accuracy and  completeness, and I agree with the above.    Truitt Merle, MD 12/26/17 1:43 PM

## 2017-12-25 ENCOUNTER — Other Ambulatory Visit: Payer: Self-pay | Admitting: *Deleted

## 2017-12-25 ENCOUNTER — Ambulatory Visit (INDEPENDENT_AMBULATORY_CARE_PROVIDER_SITE_OTHER): Payer: Medicare Other | Admitting: Cardiology

## 2017-12-25 ENCOUNTER — Encounter: Payer: Self-pay | Admitting: Cardiology

## 2017-12-25 VITALS — BP 150/72 | HR 79 | Ht 62.5 in | Wt 152.1 lb

## 2017-12-25 DIAGNOSIS — Z01818 Encounter for other preprocedural examination: Secondary | ICD-10-CM

## 2017-12-25 DIAGNOSIS — I420 Dilated cardiomyopathy: Secondary | ICD-10-CM | POA: Diagnosis not present

## 2017-12-25 DIAGNOSIS — E782 Mixed hyperlipidemia: Secondary | ICD-10-CM

## 2017-12-25 DIAGNOSIS — Z794 Long term (current) use of insulin: Secondary | ICD-10-CM

## 2017-12-25 DIAGNOSIS — E1139 Type 2 diabetes mellitus with other diabetic ophthalmic complication: Secondary | ICD-10-CM | POA: Diagnosis not present

## 2017-12-25 DIAGNOSIS — D638 Anemia in other chronic diseases classified elsewhere: Secondary | ICD-10-CM

## 2017-12-25 MED ORDER — FUROSEMIDE 20 MG PO TABS: 60.0000 mg | ORAL_TABLET | Freq: Every day | ORAL | 3 refills | Status: DC

## 2017-12-25 NOTE — Addendum Note (Signed)
Addended by: Kathyrn Sheriff on: 12/25/2017 10:50 AM   Modules accepted: Orders

## 2017-12-25 NOTE — Progress Notes (Signed)
Cardiology Office Note:    Date:  12/25/2017   ID:  [name], DOB [birth year], MRN [id number]  PCP:  [name], MD  Cardiologist:  [name], MD    Referring MD: [name], MD   Chief Complaint  Patient presents with  . 1 month follow up  . Pre-op Exam  Doing well but I need my gallbladder surgery  History of Present Illness:    [name] is a 77 y.o. female with history of nonischemic cardiomyopathy latest ejection fraction 45-50%.  Stress test done in the last year showing no evidence of ischemia.  She is hemodynamically stable.  She does have long-standing diabetes which is difficult to control.  All she seems to be doing well.  She recently had some symptoms that were related to gallbladder and gallbladder surgery cholecystectomy is contemplated. She denies having any chest pain tightness squeezing pressure burning chest she can walk climb stairs with no major difficulties she does get tired easily but otherwise doing well.  Therefore from cardiac standpoint of view she should be a acceptable candidate for surgery.  Past Medical History:  Diagnosis Date  . Anemia   . Blood transfusion   . Blood transfusion without reported diagnosis    with breast surgery  . Breast cancer ([place]) 15 years ago   left   . CHF (congestive heart failure) ([place])   . 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 Diabetes mellitus   . Heart murmur    as child  . Hernia, incisional   . Hyperlipidemia   . Hypertension   . Hypothyroidism   . Vitamin D deficiency     Past Surgical History:  Procedure Laterality Date  . ABDOMINAL HYSTERECTOMY    . APPENDECTOMY    . COLONOSCOPY  2013   due next 03-2017  . IUD REMOVAL     with appendectomy  . MASTECTOMY, MODIFIED RADICAL W/RECONSTRUCTION Left 15 years ago   10 nodes out  . TUMOR EXCISION Left    x 2, neck, head    Current Medications: Current Meds  Medication Sig  . atorvastatin (LIPITOR) 10 MG tablet Take 1 tablet (10 mg  total) by mouth daily.  . calcium carbonate (OSCAL) 1500 (600 Ca) MG TABS tablet Take by mouth daily with breakfast.  . carvedilol (COREG) 6.25 MG tablet Take 1 tablet (6.25 mg total) by mouth 2 (two) times daily.  . fenofibrate 160 MG tablet Take 160 mg by mouth daily.  . fish oil-omega-3 fatty acids 1000 MG capsule Take 2 g by mouth daily.   . furosemide (LASIX) 40 MG tablet Take 1 tablet (40 mg total) by mouth daily. (Patient taking differently: Take 60 mg by mouth daily. )  . HUMALOG KWIKPEN 100 UNIT/ML KiwkPen INJECT 5  UNITS WITH MEALS PLUS SLIDING SCALE (ISF 50, TARGET 100). 30 UNITS DAILY  . iron polysaccharides (NIFEREX) 150 MG capsule Take 150 mg by mouth 2 (two) times daily.   Marland Kitchen LANTUS SOLOSTAR 100 UNIT/ML Solostar Pen Inject 7 units in the morning and 7 units in the evening  . levothyroxine (SYNTHROID, LEVOTHROID) 112 MCG tablet Take 112 mcg by mouth daily before breakfast.  . losartan (COZAAR) 100 MG tablet Take 100 mg by mouth See admin instructions. Patient taking one-half tablet (50mg ) daily  . Menthol-Methyl Salicylate (MUSCLE RUB) 10-15 % CREA Apply 1 application topically as needed for muscle pain.  . Multiple Vitamins-Minerals (MULTIVITAMIN WITH MINERALS) tablet Take 1 tablet by mouth daily.  Marland Kitchen  ONETOUCH VERIO test strip USE TO TEST UP TO 10 TIMES D  . OVER THE COUNTER MEDICATION Take 5 mLs by mouth daily.   . ranitidine (ZANTAC) 300 MG capsule Take 300 mg by mouth 2 (two) times daily.   . vitamin C (ASCORBIC ACID) 500 MG tablet Take 500 mg by mouth daily.  . Vitamin D, Ergocalciferol, (DRISDOL) 50000 UNITS CAPS Take 50,000 Units by mouth 2 (two) times a week.      Allergies:   Erythromycin; Penicillins; Nyquil [pseudoeph-doxylamine-dm-apap]; and Sulfa antibiotics   Social History   Socioeconomic History  . Marital status: Married    Spouse name: None  . Number of children: 3  . Years of education: None  . Highest education level: None  Social Needs  . Financial  resource strain: None  . Food insecurity - worry: None  . Food insecurity - inability: None  . Transportation needs - medical: None  . Transportation needs - non-medical: None  Occupational History  . Occupation: homemaker  Tobacco Use  . Smoking status: Never Smoker  . Smokeless tobacco: Never Used  Substance and Sexual Activity  . Alcohol use: No  . Drug use: No  . Sexual activity: None  Other Topics Concern  . None  Social History Narrative  . None     Family History: The patient's family history includes Congestive Heart Failure in her father; Diabetes in her other; Heart attack in her brother; Hypertension in her father, maternal grandfather, and maternal grandmother; Peripheral vascular disease in her father; Stomach cancer in her maternal grandmother. There is no history of Colon cancer, Esophageal cancer, Pancreatic cancer, or Rectal cancer. ROS:   Please see the history of present illness.    All 14 point review of systems negative except as described per history of present illness  EKGs/Labs/Other Studies Reviewed:      Recent Labs: 10/13/2017: B Natriuretic Peptide 332.0 10/14/2017: TSH 0.980 10/29/2017: NT-Pro BNP 2,010 11/19/2017: ALT 14; BUN 41; Creatinine, Ser 1.45; Hemoglobin 9.3; Potassium 4.4; Sodium 135 11/21/2017: Platelet Count 350  Recent Lipid Panel    Component Value Date/Time   CHOL 184 07/23/2017 1538   TRIG 91 07/23/2017 1538   HDL 77 07/23/2017 1538   CHOLHDL 2.4 07/23/2017 1538   LDLCALC 89 07/23/2017 1538    Physical Exam:    VS:  BP (!) 150/72   Pulse 79   Ht 5' 2.5" (1.588 m)   Wt 152 lb 1.9 oz (69 kg)   SpO2 98%   BMI 27.38 kg/m     Wt Readings from Last 3 Encounters:  12/25/17 152 lb 1.9 oz (69 kg)  11/19/17 156 lb (70.8 kg)  11/07/17 158 lb 6.4 oz (71.8 kg)     GEN:  Well nourished, well developed in no acute distress HEENT: Normal NECK: No JVD; No carotid bruits LYMPHATICS: No lymphadenopathy CARDIAC: RRR, no murmurs,  no rubs, no gallops RESPIRATORY:  Clear to auscultation without rales, wheezing or rhonchi  ABDOMEN: Soft, non-tender, non-distended MUSCULOSKELETAL:  No edema; No deformity  SKIN: Warm and dry LOWER EXTREMITIES: no swelling NEUROLOGIC:  Alert and oriented x 3 PSYCHIATRIC:  Normal affect   ASSESSMENT:    1. Dilated cardiomyopathy ([place])   2. Type 2 diabetes mellitus with other ophthalmic complication, with long-term current use of insulin ([place])   3. Mixed hyperlipidemia    PLAN:    In order of problems listed above:  1. Dilated cardiomyopathy: Stable and appropriate medication that she is able to  tolerate and I will continue with those. 2. Type 2 diabetes: She tells me that her hemoglobin A1c is less than 10 which is pretty good for her but still the goal will be to be close to 7 as possible. 3. Dyslipidemia: Continue with statin. 4. Preop evaluation for gallbladder surgery: Stable from cardiac standpoint reviewed to proceed.  We need to pay special attention to fluid management and surgical time.  On top of that she is taking carvedilol 6.25 twice daily that medication need to be given around surgical time.  If p.o. medication not possible to give then we may switch to metoprolol 5 mg IV every 8h until patient able to take p.o. medications   Medication Adjustments/Labs and Tests Ordered: Current medicines are reviewed at length with the patient today.  Concerns regarding medicines are outlined above.  No orders of the defined types were placed in this encounter.  Medication changes: No orders of the defined types were placed in this encounter.   Signed, Park Liter, MD, [facility] 12/25/2017 10:32 AM    [place]

## 2017-12-25 NOTE — Addendum Note (Signed)
Addended by: Kathyrn Sheriff on: 12/25/2017 10:44 AM   Modules accepted: Orders

## 2017-12-25 NOTE — Patient Instructions (Signed)
Medication Instructions:  Your physician recommends that you continue on your current medications as directed. Please refer to the Current Medication list given to you today.  Please have your surgeon review Dr. Wendy Poet suggestion regarding the carvedilol for surgery.    Labwork: None   Testing/Procedures: EKG today in office.   Follow-Up: Your physician recommends that you schedule a follow-up appointment in: 3 months   Any Other Special Instructions Will Be Listed Below (If Applicable).  Please note that any paperwork needing to be filled out by the provider will need to be addressed at the front desk prior to seeing the provider. Please note that any paperwork FMLA, Disability or other documents regarding health condition is subject to a $25.00 charge that must be received prior to completion of paperwork in the form of a money order or check.     If you need a refill on your cardiac medications before your next appointment, please call your pharmacy.

## 2017-12-26 ENCOUNTER — Inpatient Hospital Stay (HOSPITAL_BASED_OUTPATIENT_CLINIC_OR_DEPARTMENT_OTHER): Payer: Medicare Other | Admitting: Hematology

## 2017-12-26 ENCOUNTER — Inpatient Hospital Stay: Payer: Medicare Other | Attending: Hematology

## 2017-12-26 ENCOUNTER — Encounter: Payer: Self-pay | Admitting: Hematology

## 2017-12-26 ENCOUNTER — Telehealth: Payer: Self-pay

## 2017-12-26 VITALS — BP 146/65 | HR 80 | Temp 97.6°F | Resp 18 | Ht 62.5 in | Wt 153.9 lb

## 2017-12-26 DIAGNOSIS — D638 Anemia in other chronic diseases classified elsewhere: Secondary | ICD-10-CM | POA: Diagnosis not present

## 2017-12-26 DIAGNOSIS — E875 Hyperkalemia: Secondary | ICD-10-CM

## 2017-12-26 DIAGNOSIS — I509 Heart failure, unspecified: Secondary | ICD-10-CM | POA: Insufficient documentation

## 2017-12-26 DIAGNOSIS — N183 Chronic kidney disease, stage 3 (moderate): Secondary | ICD-10-CM

## 2017-12-26 DIAGNOSIS — E1122 Type 2 diabetes mellitus with diabetic chronic kidney disease: Secondary | ICD-10-CM

## 2017-12-26 DIAGNOSIS — D464 Refractory anemia, unspecified: Secondary | ICD-10-CM

## 2017-12-26 LAB — CBC WITH DIFFERENTIAL (CANCER CENTER ONLY)
Basophils Absolute: 0 10*3/uL (ref 0.0–0.1)
Basophils Relative: 0 %
Eosinophils Absolute: 0.1 10*3/uL (ref 0.0–0.5)
Eosinophils Relative: 2 %
HEMATOCRIT: 27.1 % — AB (ref 34.8–46.6)
HEMOGLOBIN: 8.9 g/dL — AB (ref 11.6–15.9)
LYMPHS ABS: 1.9 10*3/uL (ref 0.9–3.3)
Lymphocytes Relative: 25 %
MCH: 29 pg (ref 25.1–34.0)
MCHC: 32.8 g/dL (ref 31.5–36.0)
MCV: 88.3 fL (ref 79.5–101.0)
MONOS PCT: 13 %
Monocytes Absolute: 1 10*3/uL — ABNORMAL HIGH (ref 0.1–0.9)
NEUTROS ABS: 4.6 10*3/uL (ref 1.5–6.5)
NEUTROS PCT: 60 %
Platelet Count: 356 10*3/uL (ref 145–400)
RBC: 3.07 MIL/uL — ABNORMAL LOW (ref 3.70–5.45)
RDW: 13.6 % (ref 11.2–14.5)
WBC Count: 7.7 10*3/uL (ref 3.9–10.3)

## 2017-12-26 LAB — CMP (CANCER CENTER ONLY)
ALBUMIN: 3.4 g/dL — AB (ref 3.5–5.0)
ALT: 14 U/L (ref 0–55)
ANION GAP: 9 (ref 3–11)
AST: 23 U/L (ref 5–34)
Alkaline Phosphatase: 47 U/L (ref 40–150)
BUN: 33 mg/dL — ABNORMAL HIGH (ref 7–26)
CHLORIDE: 97 mmol/L — AB (ref 98–109)
CO2: 31 mmol/L — AB (ref 22–29)
Calcium: 9.7 mg/dL (ref 8.4–10.4)
Creatinine: 1.3 mg/dL — ABNORMAL HIGH (ref 0.60–1.10)
GFR, EST AFRICAN AMERICAN: 45 mL/min — AB (ref >60)
GFR, Estimated: 39 mL/min — ABNORMAL LOW (ref >60)
GLUCOSE: 155 mg/dL — AB (ref 70–140)
Potassium: 5.2 mmol/L — ABNORMAL HIGH (ref 3.5–5.1)
SODIUM: 137 mmol/L (ref 136–145)
Total Bilirubin: 0.4 mg/dL (ref 0.2–1.2)
Total Protein: 6.9 g/dL (ref 6.4–8.3)

## 2017-12-26 NOTE — Telephone Encounter (Signed)
Printed avs calender of upcoming appointment. Per 2/27 los

## 2017-12-27 ENCOUNTER — Ambulatory Visit: Payer: Medicare Other | Admitting: Hematology

## 2017-12-27 ENCOUNTER — Other Ambulatory Visit: Payer: Medicare Other

## 2018-01-01 ENCOUNTER — Inpatient Hospital Stay: Payer: Medicare Other

## 2018-01-01 ENCOUNTER — Inpatient Hospital Stay: Payer: Medicare Other | Attending: Hematology

## 2018-01-01 ENCOUNTER — Other Ambulatory Visit: Payer: Self-pay | Admitting: Emergency Medicine

## 2018-01-01 VITALS — BP 122/53 | HR 74 | Temp 98.6°F | Resp 18

## 2018-01-01 DIAGNOSIS — M9902 Segmental and somatic dysfunction of thoracic region: Secondary | ICD-10-CM | POA: Diagnosis not present

## 2018-01-01 DIAGNOSIS — D631 Anemia in chronic kidney disease: Secondary | ICD-10-CM | POA: Diagnosis present

## 2018-01-01 DIAGNOSIS — E1122 Type 2 diabetes mellitus with diabetic chronic kidney disease: Secondary | ICD-10-CM | POA: Insufficient documentation

## 2018-01-01 DIAGNOSIS — M62830 Muscle spasm of back: Secondary | ICD-10-CM | POA: Diagnosis not present

## 2018-01-01 DIAGNOSIS — D649 Anemia, unspecified: Secondary | ICD-10-CM | POA: Diagnosis not present

## 2018-01-01 DIAGNOSIS — D638 Anemia in other chronic diseases classified elsewhere: Secondary | ICD-10-CM

## 2018-01-01 DIAGNOSIS — N183 Chronic kidney disease, stage 3 (moderate): Secondary | ICD-10-CM | POA: Diagnosis not present

## 2018-01-01 DIAGNOSIS — M9901 Segmental and somatic dysfunction of cervical region: Secondary | ICD-10-CM | POA: Diagnosis not present

## 2018-01-01 DIAGNOSIS — E1169 Type 2 diabetes mellitus with other specified complication: Principal | ICD-10-CM

## 2018-01-01 DIAGNOSIS — M546 Pain in thoracic spine: Secondary | ICD-10-CM | POA: Diagnosis not present

## 2018-01-01 LAB — CBC WITH DIFFERENTIAL (CANCER CENTER ONLY)
BASOS ABS: 0 10*3/uL (ref 0.0–0.1)
BASOS PCT: 0 %
EOS PCT: 2 %
Eosinophils Absolute: 0.2 10*3/uL (ref 0.0–0.5)
HCT: 27.8 % — ABNORMAL LOW (ref 34.8–46.6)
Hemoglobin: 9 g/dL — ABNORMAL LOW (ref 11.6–15.9)
Lymphocytes Relative: 19 %
Lymphs Abs: 1.5 10*3/uL (ref 0.9–3.3)
MCH: 29 pg (ref 25.1–34.0)
MCHC: 32.4 g/dL (ref 31.5–36.0)
MCV: 89.7 fL (ref 79.5–101.0)
MONO ABS: 0.8 10*3/uL (ref 0.1–0.9)
Monocytes Relative: 10 %
Neutro Abs: 5.4 10*3/uL (ref 1.5–6.5)
Neutrophils Relative %: 69 %
PLATELETS: 361 10*3/uL (ref 145–400)
RBC: 3.1 MIL/uL — AB (ref 3.70–5.45)
RDW: 13.8 % (ref 11.2–14.5)
WBC: 7.9 10*3/uL (ref 3.9–10.3)

## 2018-01-01 MED ORDER — DARBEPOETIN ALFA 100 MCG/0.5ML IJ SOSY
100.0000 ug | PREFILLED_SYRINGE | Freq: Once | INTRAMUSCULAR | Status: AC
  Administered 2018-01-01: 100 ug via SUBCUTANEOUS
  Filled 2018-01-01: qty 0.5

## 2018-01-03 DIAGNOSIS — M9901 Segmental and somatic dysfunction of cervical region: Secondary | ICD-10-CM | POA: Diagnosis not present

## 2018-01-03 DIAGNOSIS — M9902 Segmental and somatic dysfunction of thoracic region: Secondary | ICD-10-CM | POA: Diagnosis not present

## 2018-01-03 DIAGNOSIS — M546 Pain in thoracic spine: Secondary | ICD-10-CM | POA: Diagnosis not present

## 2018-01-03 DIAGNOSIS — M62830 Muscle spasm of back: Secondary | ICD-10-CM | POA: Diagnosis not present

## 2018-01-14 DIAGNOSIS — D2230 Melanocytic nevi of unspecified part of face: Secondary | ICD-10-CM | POA: Diagnosis not present

## 2018-01-14 DIAGNOSIS — M546 Pain in thoracic spine: Secondary | ICD-10-CM | POA: Diagnosis not present

## 2018-01-14 DIAGNOSIS — M62830 Muscle spasm of back: Secondary | ICD-10-CM | POA: Diagnosis not present

## 2018-01-14 DIAGNOSIS — M9902 Segmental and somatic dysfunction of thoracic region: Secondary | ICD-10-CM | POA: Diagnosis not present

## 2018-01-14 DIAGNOSIS — Z23 Encounter for immunization: Secondary | ICD-10-CM | POA: Diagnosis not present

## 2018-01-14 DIAGNOSIS — L821 Other seborrheic keratosis: Secondary | ICD-10-CM | POA: Diagnosis not present

## 2018-01-14 DIAGNOSIS — M9901 Segmental and somatic dysfunction of cervical region: Secondary | ICD-10-CM | POA: Diagnosis not present

## 2018-01-15 DIAGNOSIS — M62830 Muscle spasm of back: Secondary | ICD-10-CM | POA: Diagnosis not present

## 2018-01-15 DIAGNOSIS — M9902 Segmental and somatic dysfunction of thoracic region: Secondary | ICD-10-CM | POA: Diagnosis not present

## 2018-01-15 DIAGNOSIS — M9901 Segmental and somatic dysfunction of cervical region: Secondary | ICD-10-CM | POA: Diagnosis not present

## 2018-01-15 DIAGNOSIS — M546 Pain in thoracic spine: Secondary | ICD-10-CM | POA: Diagnosis not present

## 2018-01-16 DIAGNOSIS — M9902 Segmental and somatic dysfunction of thoracic region: Secondary | ICD-10-CM | POA: Diagnosis not present

## 2018-01-16 DIAGNOSIS — M546 Pain in thoracic spine: Secondary | ICD-10-CM | POA: Diagnosis not present

## 2018-01-16 DIAGNOSIS — M62830 Muscle spasm of back: Secondary | ICD-10-CM | POA: Diagnosis not present

## 2018-01-16 DIAGNOSIS — M9901 Segmental and somatic dysfunction of cervical region: Secondary | ICD-10-CM | POA: Diagnosis not present

## 2018-01-17 NOTE — Pre-Procedure Instructions (Addendum)
Elizabeth Olsen  01/17/2018      [facility] [place], [address] [place] [address] Phone: [phone] Fax: [phone]    Your procedure is scheduled on Tuesday, March 26th   Report to [facility] Admitting at 5:30AM             (posted surgery time 7:30a - 9:00a)   Call this number if you have problems the morning of surgery:  [phone]   Remember:              4-5 days prior to surgery, STOP TAKING ANY Vitamins, Herbal Supplements, Anti-inflammatories, Blood Thinners.   Do not eat food or drink liquids after midnight, Monday.   Take these medicines the morning of surgery with A SIP OF WATER : Carvedilol, Levothyroxine, Zantac.   Do not wear jewelry, make-up or nail polish.  Do not wear lotions, powders,  perfumes, or deodorant.  Do not shave 48 hours prior to surgery.   Do not bring valuables to the hospital.  [facility] is not responsible for any belongings or valuables.  Contacts, dentures or bridgework may not be worn into surgery.  Leave your suitcase in the car.  After surgery it may be brought to your room.  For patients admitted to the hospital, discharge time will be determined by your treatment team.  Patients discharged the day of surgery will not be allowed to drive home, and will need someone to stay with you for the first 24 hrs.   Please read over the following fact sheets that you were given. Pain Booklet and Surgical Site Infection Prevention    How to Manage Your Diabetes Before and After Surgery  Why is it important to control my blood sugar before and after surgery? . Improving blood sugar levels before and after surgery helps healing and can limit problems. . A way of improving blood sugar control is eating a healthy diet by: o  Eating less sugar and carbohydrates o  Increasing activity/exercise o  Talking with your doctor about reaching  your blood sugar goals . High blood sugars (greater than 180 mg/dL) can raise your risk of infections and slow your recovery, so you will need to focus on controlling your diabetes during the weeks before surgery. . Make sure that the doctor who takes care of your diabetes knows about your planned surgery including the date and location.  How do I manage my blood sugar before surgery? . Check your blood sugar at least 4 times a day, starting 2 days before surgery, to make sure that the level is not too high or low. Marland Kitchen  o Check your blood sugar the morning of your surgery when you wake up and every 2 hours until you get to the Short Stay unit. o  . If your blood sugar is less than 70 mg/dL, you will need to treat for low blood sugar: o Do not take insulin. o Treat a low blood sugar (less than 70 mg/dL) with  cup of clear juice (cranberry or apple), 4 glucose tablets, OR glucose gel.  DO NOT drink orange juice. o  Recheck blood sugar in 15 minutes after treatment (to make sure it is greater than 70 mg/dL). If your blood sugar is not greater than 70 mg/dL on recheck, call [phone] o  for further instructions. . Report your blood sugar to the short  stay nurse when you get to Short Stay.  . If you are admitted to the hospital after surgery: o Your blood sugar will be checked by the staff and you will probably be given insulin after surgery (instead of oral diabetes medicines) to make sure you have good blood sugar levels. o  o The goal for blood sugar control after surgery is 80-180 mg/dL.   WHAT DO I DO ABOUT MY DIABETES MEDICATION?  Marland Kitchen Do not take oral diabetes medicines (pills) the morning of surgery.  . THE NIGHT BEFORE SURGERY, take ___________ units of ___________insulin.     . THE MORNING OF SURGERY, take _____________ units of __________insulin.  . The day of surgery, do not take other diabetes injectables, including Byetta (exenatide), Bydureon (exenatide ER), Victoza  (liraglutide), or Trulicity (dulaglutide).  . If your CBG is greater than 220 mg/dL, you may take  of your sliding scale (correction) dose of insulin.  Other Instructions:       Patient Signature:  Date:   Nurse Signature:  Date:    [place]- Preparing For Surgery  Before surgery, you can play an important role. Because skin is not sterile, your skin needs to be as free of germs as possible. You can reduce the number of germs on your skin by washing with CHG (chlorahexidine gluconate) Soap before surgery.  CHG is an antiseptic cleaner which kills germs and bonds with the skin to continue killing germs even after washing.  Please do not use if you have an allergy to CHG or antibacterial soaps. If your skin becomes reddened/irritated stop using the CHG.  Do not shave (including legs and underarms) for at least 48 hours prior to first CHG shower. It is OK to shave your face.  Please follow these instructions carefully.   1. Shower the NIGHT BEFORE SURGERY and the MORNING OF SURGERY with CHG.   2. If you chose to wash your hair, wash your hair first as usual with your normal shampoo.  3. After you shampoo, rinse your hair and body thoroughly to remove the shampoo.  4. Use CHG as you would any other liquid soap. You can apply CHG directly to the skin and wash gently with a scrungie or a clean washcloth.   5. Apply the CHG Soap to your body ONLY FROM THE NECK DOWN.  Do not use on open wounds or open sores. Avoid contact with your eyes, ears, mouth and genitals (private parts). Wash Face and genitals (private parts)  with your normal soap.  6. Wash thoroughly, paying special attention to the area where your surgery will be performed.  7. Thoroughly rinse your body with warm water from the neck down.  8. DO NOT shower/wash with your normal soap after using and rinsing off the CHG Soap.  9. Pat yourself dry with a CLEAN TOWEL.  10. Wear CLEAN PAJAMAS to bed the night before  surgery, wear comfortable clothes the morning of surgery  11. Place CLEAN SHEETS on your bed the night of your first shower and DO NOT SLEEP WITH PETS.    Day of Surgery: Do not apply any deodorants/lotions. Please wear clean clothes to the hospital/surgery center.

## 2018-01-17 NOTE — Progress Notes (Signed)
Elizabeth Olsen            01/17/2018                          [facility] [place], [address] AT [place] [address] Phone: [phone] Fax: [phone]              Your procedure is scheduled on Tuesday, March 26th             Report to [facility] Admitting at 5:30AM             (posted surgery time 7:30a - 9:00a)             Call this number if you have problems the morning of surgery:            [phone]             Remember:              4-5 days prior to surgery, STOP TAKING ANY Vitamins, Herbal Supplements, Anti-inflammatories, Blood Thinners.             Do not eat food or drink liquids after midnight, Monday.             Take these medicines the morning of surgery with A SIP OF WATER : Carvedilol, Levothyroxine, Zantac.             Do not wear jewelry, make-up or nail polish.            Do not wear lotions, powders,  perfumes, or deodorant.            Do not shave 48 hours prior to surgery.             Do not bring valuables to the hospital.            [facility] is not responsible for any belongings or valuables.  Contacts, dentures or bridgework may not be worn into surgery.  Leave your suitcase in the car.  After surgery it may be brought to your room.  For patients admitted to the hospital, discharge time will be determined by your treatment team.  Patients discharged the day of surgery will not be allowed to drive home, and will need someone to stay with you for the first 24 hrs.   Please read over the following fact sheets that you were given. Pain Booklet and Surgical Site Infection Prevention              HOW TO MANAGE YOUR DIABETES BEFORE AND AFTER SURGERY  Why is it important to control my blood sugar before and after surgery?  Improving blood sugar levels before and after surgery helps healing and can limit problems.  A way of  improving blood sugar control is eating a healthy diet by: ?  Eating less sugar and carbohydrates ?  Increasing activity/exercise ?  Talking with your doctor about reaching your blood sugar goals  High blood sugars (greater than 180 mg/dL) can raise your risk of infections and slow your recovery, so you will need to focus on controlling your diabetes during the weeks before surgery.  Make sure that the doctor who takes care of your diabetes knows about your planned surgery including the date and location.  How do I manage my blood sugar before surgery?  Check your blood sugar at least 4 times a day, starting 2 days before surgery, to make sure that the level is not too high or low.   ? Check your blood sugar the morning of your surgery when you wake up and every 2 hours until you get to the Short Stay unit. ?   If your blood sugar is less than 70 mg/dL, you will need to treat for low blood sugar: ? Do not take insulin. ? Treat a low blood sugar (less than 70 mg/dL) with  cup of clear juice (cranberry or apple), 4glucose tablets, OR glucose gel.  DO NOT drink orange juice. ?  Recheck blood sugar in 15 minutes after treatment (to make sure it is greater than 70 mg/dL). If your blood sugar is not greater than 70 mg/dL on recheck, call [phone] ?  for further instructions.  Report your blood sugar to the short stay nurse when you get to Short Stay.   If you are admitted to the hospital after surgery: ? Your blood sugar will be checked by the staff and you will probably be given insulin after surgery (instead of oral diabetes medicines) to make sure you have good blood sugar levels. ?  ? The goal for blood sugar control after surgery is 80-180 mg/dL.   WHAT DO I DO ABOUT MY DIABETES MEDICATION?   Do not take oral diabetes medicines (pills) the morning of surgery.   THE NIGHT BEFORE SURGERY, take your normal dose of humalog insulin (sliding scale insulin) with dinner  and 3 units of lantus nsulin.                                         THE MORNING OF SURGERY, take 3 units of lantus insulin and no humalog (sliding scale insulin) unless your CBG is greater than 220 mg/dL, you may take  of your sliding scale (correction) dose of insulin.     The day of surgery, do not take other diabetes injectables, including Byetta (exenatide), Bydureon (exenatide ER), Victoza (liraglutide), or Trulicity (dulaglutide).   If your CBG is greater than 220 mg/dL, you may take  of your sliding scale (correction) dose of insulin.                                                                [place]- Preparing For Surgery  Before surgery, you can play an important role. Because skin is not sterile, your skin needs to be as free of germs as possible. You can reduce the number of germs on your skin by washing with CHG (chlorahexidine gluconate) Soap before surgery.  CHG is an antiseptic cleaner which kills germs and bonds with the skin to continue killing germs even after washing.  Please do not use if you have an allergy to CHG or antibacterial soaps. If your skin becomes reddened/irritated stop using the CHG.  Do not shave (including legs and underarms) for at least 48 hours prior to first CHG shower. It is OK to shave your face.  Please follow these instructions carefully.  1. Shower the NIGHT BEFORE SURGERY and the MORNING OF SURGERY with CHG.   2. If you chose to wash your hair, wash your hair first as usual with your normal shampoo.  3. After you shampoo, rinse your hair and body thoroughly to remove the shampoo.  4. Use CHG as you would any other liquid soap. You can apply CHG directly to the skin and wash gently with a scrungie or a clean washcloth.   5. Apply the CHG Soap to your body ONLY FROM THE NECK DOWN.  Do not use on open  wounds or open sores. Avoid contact with your eyes, ears, mouth and genitals (private parts). Wash Face and genitals (private parts)  with your normal soap.  6. Wash thoroughly, paying special attention to the area where your surgery will be performed.  7. Thoroughly rinse your body with warm water from the neck down.  8. DO NOT shower/wash with your normal soap after using and rinsing off the CHG Soap.  9. Pat yourself dry with a CLEAN TOWEL.  10. Wear CLEAN PAJAMAS to bed the night before surgery, wear comfortable clothes the morning of surgery  11. Place CLEAN SHEETS on your bed the night of your first shower and DO NOT SLEEP WITH PETS.    Day of Surgery: Do not apply any deodorants/lotions. Please wear clean clothes to the hospital/surgery center.

## 2018-01-18 ENCOUNTER — Other Ambulatory Visit: Payer: Self-pay

## 2018-01-18 ENCOUNTER — Encounter (HOSPITAL_COMMUNITY): Payer: Self-pay

## 2018-01-18 ENCOUNTER — Encounter (HOSPITAL_COMMUNITY)
Admission: RE | Admit: 2018-01-18 | Discharge: 2018-01-18 | Disposition: A | Discharge status: Home / Self Care | Payer: Medicare Other | Source: Ambulatory Visit | Attending: Surgery | Admitting: Surgery

## 2018-01-18 DIAGNOSIS — E785 Hyperlipidemia, unspecified: Secondary | ICD-10-CM | POA: Diagnosis not present

## 2018-01-18 DIAGNOSIS — I509 Heart failure, unspecified: Secondary | ICD-10-CM | POA: Diagnosis not present

## 2018-01-18 DIAGNOSIS — E119 Type 2 diabetes mellitus without complications: Secondary | ICD-10-CM | POA: Insufficient documentation

## 2018-01-18 DIAGNOSIS — I110 Hypertensive heart disease with heart failure: Secondary | ICD-10-CM | POA: Diagnosis not present

## 2018-01-18 DIAGNOSIS — E039 Hypothyroidism, unspecified: Secondary | ICD-10-CM | POA: Diagnosis not present

## 2018-01-18 DIAGNOSIS — I428 Other cardiomyopathies: Secondary | ICD-10-CM | POA: Insufficient documentation

## 2018-01-18 DIAGNOSIS — D649 Anemia, unspecified: Secondary | ICD-10-CM | POA: Diagnosis not present

## 2018-01-18 DIAGNOSIS — K219 Gastro-esophageal reflux disease without esophagitis: Secondary | ICD-10-CM | POA: Insufficient documentation

## 2018-01-18 DIAGNOSIS — Z79899 Other long term (current) drug therapy: Secondary | ICD-10-CM | POA: Diagnosis not present

## 2018-01-18 DIAGNOSIS — Z853 Personal history of malignant neoplasm of breast: Secondary | ICD-10-CM | POA: Diagnosis not present

## 2018-01-18 DIAGNOSIS — Z79890 Hormone replacement therapy (postmenopausal): Secondary | ICD-10-CM | POA: Diagnosis not present

## 2018-01-18 DIAGNOSIS — Z01812 Encounter for preprocedural laboratory examination: Secondary | ICD-10-CM | POA: Insufficient documentation

## 2018-01-18 DIAGNOSIS — Z794 Long term (current) use of insulin: Secondary | ICD-10-CM | POA: Diagnosis not present

## 2018-01-18 HISTORY — DX: Gastro-esophageal reflux disease without esophagitis: K21.9

## 2018-01-18 HISTORY — DX: Dyspnea, unspecified: R06.00

## 2018-01-18 LAB — HEMOGLOBIN A1C
Hgb A1c MFr Bld: 9.5 % — ABNORMAL HIGH (ref 4.8–5.6)
MEAN PLASMA GLUCOSE: 225.95 mg/dL

## 2018-01-18 LAB — CBC
HEMATOCRIT: 31.7 % — AB (ref 36.0–46.0)
Hemoglobin: 10.1 g/dL — ABNORMAL LOW (ref 12.0–15.0)
MCH: 28.3 pg (ref 26.0–34.0)
MCHC: 31.9 g/dL (ref 30.0–36.0)
MCV: 88.8 fL (ref 78.0–100.0)
Platelets: 324 10*3/uL (ref 150–400)
RBC: 3.57 MIL/uL — AB (ref 3.87–5.11)
RDW: 14 % (ref 11.5–15.5)
WBC: 6.4 10*3/uL (ref 4.0–10.5)

## 2018-01-18 LAB — BASIC METABOLIC PANEL
ANION GAP: 10 (ref 5–15)
BUN: 36 mg/dL — ABNORMAL HIGH (ref 6–20)
CO2: 28 mmol/L (ref 22–32)
Calcium: 9.4 mg/dL (ref 8.9–10.3)
Chloride: 97 mmol/L — ABNORMAL LOW (ref 101–111)
Creatinine, Ser: 1.34 mg/dL — ABNORMAL HIGH (ref 0.44–1.00)
GFR calc Af Amer: 43 mL/min — ABNORMAL LOW (ref >60)
GFR, EST NON AFRICAN AMERICAN: 37 mL/min — AB (ref >60)
GLUCOSE: 146 mg/dL — AB (ref 65–99)
POTASSIUM: 4 mmol/L (ref 3.5–5.1)
Sodium: 135 mmol/L (ref 135–145)

## 2018-01-18 LAB — GLUCOSE, CAPILLARY: Glucose-Capillary: 161 mg/dL — ABNORMAL HIGH (ref 65–99)

## 2018-01-18 NOTE — Progress Notes (Addendum)
PCP is Dr. [name] Endocrinologist is Dr. Forde Dandy  LOV 10/2017 Cardio is Dr. Tamala Fothergill  Echo 09/2017 Stress 06/2017 Currently denies any sob, cp. Oncologist is Dr. Burr Medico.  She just started receiving "anemia" injections acouple of weeks ago, and if it improves will go monthly.  Otherwise she may go every 2 weeks. She checks her blood sugars 3-4 a day.  Usually can run 42--400 plus Last A1C 9.5 09/2017 (repeated today)  Patient stated, that's good because it was 12!!

## 2018-01-21 DIAGNOSIS — M546 Pain in thoracic spine: Secondary | ICD-10-CM | POA: Diagnosis not present

## 2018-01-21 DIAGNOSIS — M9902 Segmental and somatic dysfunction of thoracic region: Secondary | ICD-10-CM | POA: Diagnosis not present

## 2018-01-21 DIAGNOSIS — M62830 Muscle spasm of back: Secondary | ICD-10-CM | POA: Diagnosis not present

## 2018-01-21 DIAGNOSIS — M9901 Segmental and somatic dysfunction of cervical region: Secondary | ICD-10-CM | POA: Diagnosis not present

## 2018-01-21 NOTE — Progress Notes (Signed)
Anesthesia Chart Review: Patient is a 77 year old female scheduled for laparoscopic cholecystectomy on 01/22/18 by Dr. Donnie Mesa.  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  - Admission 10/13/18 -10/14/18 for acute respiratory failure with hypoxia, acute systolic CHF in the setting of non-compliance with diuretics and Coreg and chronic anemia. Also noted to have mild renal insufficiency and elevated A1c of 9.5 (which was reportedly down for 12).   - PCP is Dr. [name].  - Cardiologist is Dr. [name], last visit 12/25/17. He wrote, "Preop evaluation for gallbladder surgery: Stable from cardiac standpoint reviewed to proceed.  We need to pay special attention to fluid management and surgical time.  On top of that she is taking carvedilol 6.25 twice daily that medication need to be given around surgical time.  If p.o. medication not possible to give then we may switch to metoprolol 5 mg IV every 8h until patient able to take p.o. medications".  - Endocrinologist is Dr. Reynold Bowen. - HEM-ONC is Dr. Truitt Merle. By 12/26/17 note, she underwent bone marrow biopsy on 11/21/17 that showed "hypercellular bone marrow, with granulocytic hyperplasia, no evidence of dysplasia." Her anemia is felt most consistent with anemia of chronic disease.  Meds include Lipitor, Coreg, fenofibrate, fish oil omega-3 fatty acids, Lasix, Lantus, Humalog, Niferex, levothyroxine, losartan, Zantac.  BP (!) 149/66   Pulse 75   Temp 36.7 C   Resp 18   Ht _0  (1.575 m)   Wt 152 lb 1.6 oz (69 kg)   SpO2 100%   BMI 27.82 kg/m   EKG 12/25/17: NSR.  Echo 10/13/17: Study Conclusions - Left ventricle: The cavity size was normal.  Wall thickness was   increased in a pattern of mild LVH. Systolic function was mildly   reduced. The estimated ejection fraction was in the range of 45%   to 50%. Diffuse hypokinesis. Features are consistent with a   pseudonormal left ventricular filling pattern, with concomitant   abnormal relaxation and increased filling pressure (grade 2   diastolic dysfunction). - Aortic valve: Mildly calcified annulus. Trileaflet; mildly   calcified leaflets. Left coronary cusp mobility was restricted.   There was mild regurgitation. - Mitral valve: Mildly calcified annulus. There was mild to   moderate regurgitation. - Left atrium: The atrium was mildly to moderately dilated. - Tricuspid valve: There was mild regurgitation. - Pulmonary arteries: Systolic pressure could not be accurately   estimated. - Pericardium, extracardiac: A trivial pericardial effusion was   identified. (Comparison LVEF 45-50% 07/18/17.)  Nuclear stress test 07/27/17:  Nuclear stress EF: 53%.  There was no ST segment deviation noted during stress.  The study is normal.  This is a low risk study.  The left ventricular ejection fraction is mildly decreased (45-54%).  Mild diffuse hypokinesis. No evidence for prior infarct or ischemia.   CXR 10/13/17 (during admission CHF exacerbation):  IMPRESSION: Mild vascular congestion. Patchy infiltrate in the right lung base.  Preoperative labs noted. A1c remains elevated at 9.5. She reported variable home glucose readings of 42 to HIGH. Glucose with labs was 146. Cr stable at 1.34. K 4.0. H/H up to 10.1/31.7. PLT 324. She will get a fasting CBG on arrival. Labs also marked as reviewed by Dr. Georgette Dover.  If fasting glucose result acceptable and otherwise  no acute changes then I anticipate that she can proceed as planned.  George [facility] Short Stay Center/Anesthesiology Phone [phone] 01/21/2018 10:20 AM

## 2018-01-21 NOTE — Anesthesia Preprocedure Evaluation (Signed)
Anesthesia Evaluation  Patient identified by MRN, date of birth, ID band Patient awake    Reviewed: Allergy & Precautions, H&P , NPO status   History of Anesthesia Complications Negative for: history of anesthetic complications  Airway Mallampati: II       Dental  (+) Teeth Intact   Pulmonary    breath sounds clear to auscultation       Cardiovascular hypertension, Pt. on medications +CHF  + Valvular Problems/Murmurs  Rhythm:Regular Rate:Normal  EKG 12/25/17: NSR.  Echo 10/13/17: Study Conclusions - Left ventricle: The cavity size was normal. Wall thickness was increased in a pattern of mild LVH. Systolic function was mildly reduced. The estimated ejection fraction was in the range of 45% to 50%. Diffuse hypokinesis. Features are consistent with a pseudonormal left ventricular filling pattern, with concomitant abnormal relaxation and increased filling pressure (grade 2 diastolic dysfunction). - Aortic valve: Mildly calcified annulus. Trileaflet; mildly calcified leaflets. Left coronary cusp mobility was restricted. There was mild regurgitation. - Mitral valve: Mildly calcified annulus. There was mild to moderate regurgitation. - Left atrium: The atrium was mildly to moderately dilated. - Tricuspid valve: There was mild regurgitation. - Pulmonary arteries: Systolic pressure could not be accurately estimated. - Pericardium, extracardiac: A trivial pericardial effusion was identified. (Comparison LVEF 45-50% 07/18/17.)  Nuclear stress test 07/27/17:  Nuclear stress EF: 53%.  There was no ST segment deviation noted during stress.  The study is normal.  This is a low risk study.  The left ventricular ejection fraction is mildly decreased (45-54%). Mild diffuse hypokinesis. No evidence for prior infarct or ischemia.      Neuro/Psych negative neurological ROS  negative psych ROS   GI/Hepatic Neg liver ROS, GERD  Medicated,  Endo/Other  diabetes, Insulin DependentHypothyroidism   Renal/GU   negative genitourinary   Musculoskeletal negative musculoskeletal ROS (+)   Abdominal   Peds negative pediatric ROS (+)  Hematology  (+) anemia ,   Anesthesia Other Findings - Cardiologist is Dr. [name], last visit 12/25/17. He wrote, "Preop evaluation for gallbladder surgery: Stable from cardiac standpoint reviewed to proceed.  We need to pay special attention to fluid management and surgical time.  On top of that she is taking carvedilol 6.25 twice daily that medication need to be given around surgical time.  If p.o. medication not possible to give then we may switch to metoprolol 5 mg IV every 8h until patient able to take p.o. medications".   Reproductive/Obstetrics negative OB ROS                            Anesthesia Physical  Anesthesia Plan  ASA: III  Anesthesia Plan: General   Post-op Pain Management:    Induction: Intravenous  PONV Risk Score and Plan: 3 and Ondansetron and Treatment may vary due to age or medical condition  Airway Management Planned: Oral ETT and LMA  Additional Equipment:   Intra-op Plan:   Post-operative Plan: Extubation in OR  Informed Consent: I have reviewed the patients History and Physical, chart, labs and discussed the procedure including the risks, benefits and alternatives for the proposed anesthesia with the patient or authorized representative who has indicated his/her understanding and acceptance.     Plan Discussed with: CRNA, Surgeon and Anesthesiologist  Anesthesia Plan Comments: (  )        Anesthesia Quick Evaluation

## 2018-01-22 ENCOUNTER — Other Ambulatory Visit: Payer: Self-pay

## 2018-01-22 ENCOUNTER — Encounter (HOSPITAL_COMMUNITY)
Admission: RE | Disposition: A | Discharge status: Home / Self Care | Payer: Self-pay | Source: Ambulatory Visit | Attending: Surgery

## 2018-01-22 ENCOUNTER — Ambulatory Visit (HOSPITAL_COMMUNITY): Payer: Medicare Other | Admitting: Anesthesiology

## 2018-01-22 ENCOUNTER — Ambulatory Visit (HOSPITAL_COMMUNITY): Payer: Medicare Other | Admitting: Vascular Surgery

## 2018-01-22 ENCOUNTER — Encounter (HOSPITAL_COMMUNITY): Payer: Self-pay | Admitting: General Practice

## 2018-01-22 ENCOUNTER — Ambulatory Visit (HOSPITAL_COMMUNITY): Payer: Medicare Other

## 2018-01-22 ENCOUNTER — Observation Stay (HOSPITAL_COMMUNITY)
Admission: RE | Admit: 2018-01-22 | Discharge: 2018-01-23 | Disposition: A | Discharge status: Home / Self Care | Payer: Medicare Other | Source: Ambulatory Visit | Attending: Surgery | Admitting: Surgery

## 2018-01-22 DIAGNOSIS — I255 Ischemic cardiomyopathy: Secondary | ICD-10-CM | POA: Insufficient documentation

## 2018-01-22 DIAGNOSIS — K8010 Calculus of gallbladder with chronic cholecystitis without obstruction: Secondary | ICD-10-CM | POA: Diagnosis not present

## 2018-01-22 DIAGNOSIS — R011 Cardiac murmur, unspecified: Secondary | ICD-10-CM | POA: Diagnosis not present

## 2018-01-22 DIAGNOSIS — K219 Gastro-esophageal reflux disease without esophagitis: Secondary | ICD-10-CM | POA: Insufficient documentation

## 2018-01-22 DIAGNOSIS — K811 Chronic cholecystitis: Secondary | ICD-10-CM | POA: Diagnosis not present

## 2018-01-22 DIAGNOSIS — I5021 Acute systolic (congestive) heart failure: Secondary | ICD-10-CM | POA: Diagnosis not present

## 2018-01-22 DIAGNOSIS — Z9012 Acquired absence of left breast and nipple: Secondary | ICD-10-CM | POA: Insufficient documentation

## 2018-01-22 DIAGNOSIS — Z882 Allergy status to sulfonamides status: Secondary | ICD-10-CM | POA: Insufficient documentation

## 2018-01-22 DIAGNOSIS — E7800 Pure hypercholesterolemia, unspecified: Secondary | ICD-10-CM | POA: Insufficient documentation

## 2018-01-22 DIAGNOSIS — Z79899 Other long term (current) drug therapy: Secondary | ICD-10-CM | POA: Diagnosis not present

## 2018-01-22 DIAGNOSIS — J9601 Acute respiratory failure with hypoxia: Secondary | ICD-10-CM | POA: Diagnosis not present

## 2018-01-22 DIAGNOSIS — I110 Hypertensive heart disease with heart failure: Secondary | ICD-10-CM | POA: Diagnosis not present

## 2018-01-22 DIAGNOSIS — Z853 Personal history of malignant neoplasm of breast: Secondary | ICD-10-CM | POA: Insufficient documentation

## 2018-01-22 DIAGNOSIS — E039 Hypothyroidism, unspecified: Secondary | ICD-10-CM | POA: Diagnosis not present

## 2018-01-22 DIAGNOSIS — Z888 Allergy status to other drugs, medicaments and biological substances status: Secondary | ICD-10-CM | POA: Insufficient documentation

## 2018-01-22 DIAGNOSIS — Z8249 Family history of ischemic heart disease and other diseases of the circulatory system: Secondary | ICD-10-CM | POA: Insufficient documentation

## 2018-01-22 DIAGNOSIS — I509 Heart failure, unspecified: Secondary | ICD-10-CM | POA: Insufficient documentation

## 2018-01-22 DIAGNOSIS — Z794 Long term (current) use of insulin: Secondary | ICD-10-CM | POA: Diagnosis not present

## 2018-01-22 DIAGNOSIS — K8020 Calculus of gallbladder without cholecystitis without obstruction: Secondary | ICD-10-CM | POA: Diagnosis not present

## 2018-01-22 DIAGNOSIS — Z880 Allergy status to penicillin: Secondary | ICD-10-CM | POA: Diagnosis not present

## 2018-01-22 DIAGNOSIS — E119 Type 2 diabetes mellitus without complications: Secondary | ICD-10-CM | POA: Insufficient documentation

## 2018-01-22 DIAGNOSIS — Z881 Allergy status to other antibiotic agents status: Secondary | ICD-10-CM | POA: Insufficient documentation

## 2018-01-22 DIAGNOSIS — Z419 Encounter for procedure for purposes other than remedying health state, unspecified: Secondary | ICD-10-CM

## 2018-01-22 HISTORY — DX: Calculus of gallbladder with chronic cholecystitis without obstruction: K80.10

## 2018-01-22 HISTORY — PX: CHOLECYSTECTOMY: SHX55

## 2018-01-22 LAB — CBC
HEMATOCRIT: 30.5 % — AB (ref 36.0–46.0)
HEMOGLOBIN: 9.7 g/dL — AB (ref 12.0–15.0)
MCH: 28.8 pg (ref 26.0–34.0)
MCHC: 31.8 g/dL (ref 30.0–36.0)
MCV: 90.5 fL (ref 78.0–100.0)
PLATELETS: 332 10*3/uL (ref 150–400)
RBC: 3.37 MIL/uL — ABNORMAL LOW (ref 3.87–5.11)
RDW: 13.9 % (ref 11.5–15.5)
WBC: 6.3 10*3/uL (ref 4.0–10.5)

## 2018-01-22 LAB — GLUCOSE, CAPILLARY
GLUCOSE-CAPILLARY: 203 mg/dL — AB (ref 65–99)
GLUCOSE-CAPILLARY: 474 mg/dL — AB (ref 65–99)
GLUCOSE-CAPILLARY: 404 mg/dL — AB (ref 65–99)
GLUCOSE-CAPILLARY: 357 mg/dL — AB (ref 65–99)
Glucose-Capillary: 153 mg/dL — ABNORMAL HIGH (ref 65–99)
Glucose-Capillary: 152 mg/dL — ABNORMAL HIGH (ref 65–99)

## 2018-01-22 LAB — CREATININE, SERUM
Creatinine, Ser: 1.26 mg/dL — ABNORMAL HIGH (ref 0.44–1.00)
GFR calc non Af Amer: 40 mL/min — ABNORMAL LOW (ref >60)
GFR, EST AFRICAN AMERICAN: 47 mL/min — AB (ref >60)

## 2018-01-22 SURGERY — LAPAROSCOPIC CHOLECYSTECTOMY WITH INTRAOPERATIVE CHOLANGIOGRAM
Anesthesia: General | Site: Abdomen

## 2018-01-22 MED ORDER — ROCURONIUM BROMIDE 10 MG/ML (PF) SYRINGE
PREFILLED_SYRINGE | INTRAVENOUS | Status: AC
  Filled 2018-01-22: qty 5

## 2018-01-22 MED ORDER — ONDANSETRON 4 MG PO TBDP: 4.0000 mg | ORAL_TABLET | Freq: Four times a day (QID) | ORAL | Status: DC | PRN

## 2018-01-22 MED ORDER — CARVEDILOL 6.25 MG PO TABS
6.2500 mg | ORAL_TABLET | Freq: Two times a day (BID) | ORAL | Status: DC
  Administered 2018-01-22: 6.25 mg via ORAL
  Filled 2018-01-22: qty 1

## 2018-01-22 MED ORDER — ROCURONIUM BROMIDE 10 MG/ML (PF) SYRINGE
PREFILLED_SYRINGE | INTRAVENOUS | Status: DC | PRN
  Administered 2018-01-22: 50 mg via INTRAVENOUS

## 2018-01-22 MED ORDER — DEXAMETHASONE SODIUM PHOSPHATE 10 MG/ML IJ SOLN
INTRAMUSCULAR | Status: AC
  Filled 2018-01-22: qty 1

## 2018-01-22 MED ORDER — PROPOFOL 10 MG/ML IV BOLUS
INTRAVENOUS | Status: AC
  Filled 2018-01-22: qty 40

## 2018-01-22 MED ORDER — LIDOCAINE 2% (20 MG/ML) 5 ML SYRINGE
INTRAMUSCULAR | Status: DC | PRN
  Administered 2018-01-22: 60 mg via INTRAVENOUS

## 2018-01-22 MED ORDER — SODIUM CHLORIDE 0.9 % IV SOLN
INTRAVENOUS | Status: DC | PRN
  Administered 2018-01-22: 13 mL

## 2018-01-22 MED ORDER — ACETAMINOPHEN 650 MG RE SUPP: 650.0000 mg | Freq: Four times a day (QID) | RECTAL | Status: DC | PRN

## 2018-01-22 MED ORDER — FUROSEMIDE 20 MG PO TABS
60.0000 mg | ORAL_TABLET | Freq: Every day | ORAL | Status: DC
  Administered 2018-01-22: 60 mg via ORAL
  Filled 2018-01-22: qty 1

## 2018-01-22 MED ORDER — CHLORHEXIDINE GLUCONATE CLOTH 2 % EX PADS: 6.0000 | MEDICATED_PAD | Freq: Once | CUTANEOUS | Status: DC

## 2018-01-22 MED ORDER — STERILE WATER FOR IRRIGATION IR SOLN
Status: DC | PRN
  Administered 2018-01-22: 1000 mL

## 2018-01-22 MED ORDER — FENTANYL CITRATE (PF) 250 MCG/5ML IJ SOLN
INTRAMUSCULAR | Status: DC | PRN
  Administered 2018-01-22 (×3): 50 ug via INTRAVENOUS

## 2018-01-22 MED ORDER — TRAMADOL HCL 50 MG PO TABS: 50.0000 mg | ORAL_TABLET | Freq: Four times a day (QID) | ORAL | Status: DC | PRN

## 2018-01-22 MED ORDER — BUPIVACAINE-EPINEPHRINE 0.25% -1:200000 IJ SOLN
INTRAMUSCULAR | Status: DC | PRN
  Administered 2018-01-22: 10 mL

## 2018-01-22 MED ORDER — MORPHINE SULFATE (PF) 4 MG/ML IV SOLN: 2.0000 mg | INTRAVENOUS | Status: DC | PRN

## 2018-01-22 MED ORDER — CEFAZOLIN SODIUM-DEXTROSE 2-4 GM/100ML-% IV SOLN
2.0000 g | INTRAVENOUS | Status: AC
  Administered 2018-01-22: 2 g via INTRAVENOUS
  Filled 2018-01-22: qty 100

## 2018-01-22 MED ORDER — 0.9 % SODIUM CHLORIDE (POUR BTL) OPTIME
TOPICAL | Status: DC | PRN
  Administered 2018-01-22: 1000 mL

## 2018-01-22 MED ORDER — LACTATED RINGERS IV SOLN
INTRAVENOUS | Status: DC | PRN
  Administered 2018-01-22 (×2): via INTRAVENOUS

## 2018-01-22 MED ORDER — FENTANYL CITRATE (PF) 250 MCG/5ML IJ SOLN
INTRAMUSCULAR | Status: AC
  Filled 2018-01-22: qty 5

## 2018-01-22 MED ORDER — INSULIN ASPART 100 UNIT/ML ~~LOC~~ SOLN: 0.0000 [IU] | Freq: Every day | SUBCUTANEOUS | Status: DC

## 2018-01-22 MED ORDER — BUPIVACAINE-EPINEPHRINE (PF) 0.25% -1:200000 IJ SOLN
INTRAMUSCULAR | Status: AC
  Filled 2018-01-22: qty 30

## 2018-01-22 MED ORDER — IOPAMIDOL (ISOVUE-300) INJECTION 61%
INTRAVENOUS | Status: AC
  Filled 2018-01-22: qty 50

## 2018-01-22 MED ORDER — SUGAMMADEX SODIUM 200 MG/2ML IV SOLN
INTRAVENOUS | Status: DC | PRN
  Administered 2018-01-22: 150 mg via INTRAVENOUS

## 2018-01-22 MED ORDER — PHENYLEPHRINE HCL 10 MG/ML IJ SOLN
INTRAMUSCULAR | Status: DC | PRN
  Administered 2018-01-22: 25 ug/min via INTRAVENOUS

## 2018-01-22 MED ORDER — INSULIN ASPART 100 UNIT/ML ~~LOC~~ SOLN
0.0000 [IU] | Freq: Three times a day (TID) | SUBCUTANEOUS | Status: DC
  Administered 2018-01-22: 15 [IU] via SUBCUTANEOUS
  Administered 2018-01-23: 3 [IU] via SUBCUTANEOUS

## 2018-01-22 MED ORDER — LIDOCAINE HCL (CARDIAC) 20 MG/ML IV SOLN
INTRAVENOUS | Status: AC
  Filled 2018-01-22: qty 5

## 2018-01-22 MED ORDER — CALCIUM CARBONATE 1500 (600 CA) MG PO TABS
600.0000 mg | ORAL_TABLET | Freq: Every day | ORAL | Status: DC
  Administered 2018-01-23: 1500 mg via ORAL
  Filled 2018-01-22: qty 1

## 2018-01-22 MED ORDER — HYDROCODONE-ACETAMINOPHEN 5-325 MG PO TABS: 1.0000 | ORAL_TABLET | ORAL | Status: DC | PRN

## 2018-01-22 MED ORDER — ONDANSETRON HCL 4 MG/2ML IJ SOLN
INTRAMUSCULAR | Status: DC | PRN
  Administered 2018-01-22: 4 mg via INTRAVENOUS

## 2018-01-22 MED ORDER — DIPHENHYDRAMINE HCL 50 MG/ML IJ SOLN: 12.5000 mg | Freq: Four times a day (QID) | INTRAMUSCULAR | Status: DC | PRN

## 2018-01-22 MED ORDER — PROPOFOL 10 MG/ML IV BOLUS
INTRAVENOUS | Status: DC | PRN
  Administered 2018-01-22: 140 mg via INTRAVENOUS

## 2018-01-22 MED ORDER — ACETAMINOPHEN 325 MG PO TABS: 650.0000 mg | ORAL_TABLET | Freq: Four times a day (QID) | ORAL | Status: DC | PRN

## 2018-01-22 MED ORDER — INSULIN GLARGINE 100 UNIT/ML ~~LOC~~ SOLN
7.0000 [IU] | Freq: Two times a day (BID) | SUBCUTANEOUS | Status: DC
  Administered 2018-01-22: 7 [IU] via SUBCUTANEOUS
  Filled 2018-01-22 (×2): qty 0.07

## 2018-01-22 MED ORDER — ENOXAPARIN SODIUM 40 MG/0.4ML ~~LOC~~ SOLN
40.0000 mg | SUBCUTANEOUS | Status: DC
  Administered 2018-01-23: 40 mg via SUBCUTANEOUS
  Filled 2018-01-22: qty 0.4

## 2018-01-22 MED ORDER — DEXAMETHASONE SODIUM PHOSPHATE 10 MG/ML IJ SOLN
INTRAMUSCULAR | Status: DC | PRN
  Administered 2018-01-22: 5 mg via INTRAVENOUS

## 2018-01-22 MED ORDER — EPHEDRINE SULFATE-NACL 50-0.9 MG/10ML-% IV SOSY
PREFILLED_SYRINGE | INTRAVENOUS | Status: DC | PRN
  Administered 2018-01-22: 5 mg via INTRAVENOUS

## 2018-01-22 MED ORDER — DIPHENHYDRAMINE HCL 12.5 MG/5ML PO ELIX: 12.5000 mg | ORAL_SOLUTION | Freq: Four times a day (QID) | ORAL | Status: DC | PRN

## 2018-01-22 MED ORDER — ONDANSETRON HCL 4 MG/2ML IJ SOLN
INTRAMUSCULAR | Status: AC
  Filled 2018-01-22: qty 2

## 2018-01-22 MED ORDER — LEVOTHYROXINE SODIUM 112 MCG PO TABS
112.0000 ug | ORAL_TABLET | ORAL | Status: DC
  Filled 2018-01-22: qty 1

## 2018-01-22 MED ORDER — FENTANYL CITRATE (PF) 100 MCG/2ML IJ SOLN: 25.0000 ug | INTRAMUSCULAR | Status: DC | PRN

## 2018-01-22 MED ORDER — MEPERIDINE HCL 50 MG/ML IJ SOLN: 6.2500 mg | INTRAMUSCULAR | Status: DC | PRN

## 2018-01-22 MED ORDER — FENOFIBRATE 160 MG PO TABS
160.0000 mg | ORAL_TABLET | Freq: Every day | ORAL | Status: DC
  Administered 2018-01-22: 160 mg via ORAL
  Filled 2018-01-22 (×2): qty 1

## 2018-01-22 MED ORDER — ONDANSETRON HCL 4 MG/2ML IJ SOLN: 4.0000 mg | Freq: Four times a day (QID) | INTRAMUSCULAR | Status: DC | PRN

## 2018-01-22 MED ORDER — LOSARTAN POTASSIUM 50 MG PO TABS
50.0000 mg | ORAL_TABLET | Freq: Every day | ORAL | Status: DC
  Administered 2018-01-22: 50 mg via ORAL
  Filled 2018-01-22: qty 1

## 2018-01-22 MED ORDER — SODIUM CHLORIDE 0.9 % IV SOLN
INTRAVENOUS | Status: DC
  Administered 2018-01-22: 14:00:00 via INTRAVENOUS

## 2018-01-22 MED ORDER — SODIUM CHLORIDE 0.9 % IR SOLN
Status: DC | PRN
  Administered 2018-01-22: 1000 mL

## 2018-01-22 MED ORDER — CALCIUM CARBONATE 1500 (600 CA) MG PO TABS
600.0000 mg | ORAL_TABLET | Freq: Every day | ORAL | Status: DC
  Filled 2018-01-22: qty 1

## 2018-01-22 MED ORDER — ATORVASTATIN CALCIUM 10 MG PO TABS
10.0000 mg | ORAL_TABLET | Freq: Every day | ORAL | Status: DC
  Administered 2018-01-22: 10 mg via ORAL
  Filled 2018-01-22: qty 1

## 2018-01-22 SURGICAL SUPPLY — 44 items
APPLIER CLIP ROT 10 11.4 M/L (STAPLE) ×3
BENZOIN TINCTURE PRP APPL 2/3 (GAUZE/BANDAGES/DRESSINGS) ×3 IMPLANT
BLADE CLIPPER SURG (BLADE) IMPLANT
CANISTER SUCT 3000ML PPV (MISCELLANEOUS) ×3 IMPLANT
CHLORAPREP W/TINT 26ML (MISCELLANEOUS) ×3 IMPLANT
CLIP APPLIE ROT 10 11.4 M/L (STAPLE) ×1 IMPLANT
CLOSURE WOUND 1/2 X4 (GAUZE/BANDAGES/DRESSINGS) ×1
COVER MAYO STAND STRL (DRAPES) ×3 IMPLANT
COVER SURGICAL LIGHT HANDLE (MISCELLANEOUS) ×3 IMPLANT
DRAPE C-ARM 42X72 X-RAY (DRAPES) ×3 IMPLANT
DRSG TEGADERM 2-3/8X2-3/4 SM (GAUZE/BANDAGES/DRESSINGS) ×3 IMPLANT
DRSG TEGADERM 4X4.75 (GAUZE/BANDAGES/DRESSINGS) ×3 IMPLANT
ELECT REM PT RETURN 9FT ADLT (ELECTROSURGICAL) ×3
ELECTRODE REM PT RTRN 9FT ADLT (ELECTROSURGICAL) ×1 IMPLANT
FILTER SMOKE EVAC LAPAROSHD (FILTER) ×3 IMPLANT
GAUZE SPONGE 2X2 8PLY STRL LF (GAUZE/BANDAGES/DRESSINGS) ×1 IMPLANT
GLOVE BIO SURGEON STRL SZ7 (GLOVE) ×3 IMPLANT
GLOVE BIOGEL PI IND STRL 7.5 (GLOVE) ×1 IMPLANT
GLOVE BIOGEL PI INDICATOR 7.5 (GLOVE) ×2
GOWN STRL REUS W/ TWL LRG LVL3 (GOWN DISPOSABLE) ×3 IMPLANT
GOWN STRL REUS W/TWL LRG LVL3 (GOWN DISPOSABLE) ×6
KIT BASIN OR (CUSTOM PROCEDURE TRAY) ×3 IMPLANT
KIT ROOM TURNOVER OR (KITS) ×3 IMPLANT
NS IRRIG 1000ML POUR BTL (IV SOLUTION) ×3 IMPLANT
PAD ARMBOARD 7.5X6 YLW CONV (MISCELLANEOUS) ×3 IMPLANT
POUCH RETRIEVAL ECOSAC 10 (ENDOMECHANICALS) IMPLANT
POUCH RETRIEVAL ECOSAC 10MM (ENDOMECHANICALS)
POUCH SPECIMEN RETRIEVAL 10MM (ENDOMECHANICALS) ×3 IMPLANT
SCISSORS LAP 5X35 DISP (ENDOMECHANICALS) ×3 IMPLANT
SET CHOLANGIOGRAPH 5 50 .035 (SET/KITS/TRAYS/PACK) ×3 IMPLANT
SET IRRIG TUBING LAPAROSCOPIC (IRRIGATION / IRRIGATOR) ×3 IMPLANT
SLEEVE ENDOPATH XCEL 5M (ENDOMECHANICALS) ×3 IMPLANT
SPECIMEN JAR SMALL (MISCELLANEOUS) ×3 IMPLANT
SPONGE GAUZE 2X2 STER 10/PKG (GAUZE/BANDAGES/DRESSINGS) ×2
STRIP CLOSURE SKIN 1/2X4 (GAUZE/BANDAGES/DRESSINGS) ×2 IMPLANT
SUT MNCRL AB 4-0 PS2 18 (SUTURE) ×3 IMPLANT
TOWEL OR 17X24 6PK STRL BLUE (TOWEL DISPOSABLE) ×3 IMPLANT
TOWEL OR 17X26 10 PK STRL BLUE (TOWEL DISPOSABLE) ×3 IMPLANT
TRAY LAPAROSCOPIC MC (CUSTOM PROCEDURE TRAY) ×3 IMPLANT
TROCAR XCEL BLUNT TIP 100MML (ENDOMECHANICALS) ×3 IMPLANT
TROCAR XCEL NON-BLD 11X100MML (ENDOMECHANICALS) ×3 IMPLANT
TROCAR XCEL NON-BLD 5MMX100MML (ENDOMECHANICALS) ×3 IMPLANT
TUBING INSUFFLATION (TUBING) ×3 IMPLANT
WATER STERILE IRR 1000ML POUR (IV SOLUTION) ×3 IMPLANT

## 2018-01-22 NOTE — H&P (Signed)
History of Present Illness  The patient is a 77 year old female who presents for evaluation of gall stones. Referred by Dr. Marijo File for symptomatic gallstones Cardiology - Dr. Agustin Cree  This is a 77 year old female with multiple medical issues including ischemic cardiomyopathy, congestive heart failure, history of left breast cancer status post mastectomy with TRAM flap reconstruction who presents after recent ER visit.  On 11/19/17, the patient experienced sudden onset of nausea, vomiting, diarrhea, and right upper quadrant abdominal pain. She was evaluated in the emergency department and was noted to have gallstones. Liver function tests showed total bilirubin of 1.4, alkaline phosphatase 34, ALT 14, AST 52. Her symptoms resolved spontaneously. She has not had any similar episodes since that time. She does not remember any recent symptoms similar to that day. She is now referred for surgical evaluation.  In 1998 patient had a left mastectomy with TRAM flap. Apparently she developed an incisional hernia on the left and had this repair. She is also had a hysterectomy through a lower midline incision.  CLINICAL DATA: Abdominal pain with nausea and diarrhea  EXAM: ULTRASOUND ABDOMEN LIMITED RIGHT UPPER QUADRANT  COMPARISON: CT 07/23/2014  FINDINGS: Gallbladder:  Tumefactive sludge and/or nonshadowing stones are present. Negative sonographic Murphy. Normal wall thickness.  Common bile duct:  Diameter: 2.5 mm  Liver:  No focal lesion identified. Within normal limits in parenchymal echogenicity. Portal vein is patent on color Doppler imaging with normal direction of blood flow towards the liver.  IMPRESSION: 1. Tumefactive sludge and/or nonshadowing small stones in the gallbladder. Negative for acute cholecystitis or biliary dilatation   Electronically Signed By: Donavan Foil M.D. On: 11/20/2017 00:15  CLINICAL DATA: Right lower quadrant pain and  cramping during a bowel movement. Nausea. Elevated blood sugar. White cell count 13.9. History of left breast cancer, diabetes, hypertension, incisional hernia, appendectomy, and hysterectomy.  EXAM: CT ABDOMEN AND PELVIS WITH CONTRAST  TECHNIQUE: Multidetector CT imaging of the abdomen and pelvis was performed using the standard protocol following bolus administration of intravenous contrast.  CONTRAST: 75 mL Isovue 300  COMPARISON: 07/23/2014  FINDINGS: Lower chest: Dependent atelectasis in the lung bases.  Hepatobiliary: Cholelithiasis with small stones in the gallbladder. No inflammatory changes appreciated. No bile duct dilatation. Homogeneous liver parenchyma. No focal lesions.  Pancreas: Unremarkable. No pancreatic ductal dilatation or surrounding inflammatory changes.  Spleen: Normal in size without focal abnormality.  Adrenals/Urinary Tract: Adrenal glands are unremarkable. Kidneys are normal, without renal calculi, focal lesion, or hydronephrosis. Bladder is unremarkable.  Stomach/Bowel: Stomach, small bowel, and colon are mostly decompressed. No inflammatory stranding is identified. Appendix is surgically absent.  Vascular/Lymphatic: Aortic atherosclerosis. No enlarged abdominal or pelvic lymph nodes.  Reproductive: Status post hysterectomy. No adnexal masses.  Other: Scattered surgical clips in the abdomen and abdominal wall. Abdominal wall musculature appears intact. No free air or free fluid in the abdomen.  Musculoskeletal: Degenerative changes in the spine. No destructive bone lesions.  IMPRESSION: 1. No acute process demonstrated in the abdomen or pelvis. No evidence of bowel obstruction or inflammation. 2. Cholelithiasis without changes of cholecystitis. 3. Aortic atherosclerosis.   Electronically Signed By: Lucienne Capers M.D. On: 11/20/2017 02:07    Past Surgical History Appendectomy Breast Biopsy  Left. Breast Reconstruction Left. Cataract Surgery Left. Colon Polyp Removal - Colonoscopy Hysterectomy (not due to cancer) - Partial Mastectomy Left.  Diagnostic Studies History Colonoscopy within last year Mammogram 1-3 years ago Pap Smear 1-5 years ago  Allergies  NyQuil *COUGH/COLD/ALLERGY* Penicillin G Benzathine &  Proc *PENICILLINS* Sulfa Antibiotics  Medication History  Atorvastatin Calcium (10MG  Tablet, Oral) Active. Carvedilol (6.25MG  Tablet, Oral) Active. Fenofibrate (160MG  Tablet, Oral) Active. Levothyroxine Sodium (112MCG Tablet, Oral) Active. Losartan Potassium (100MG  Tablet, Oral) Active. Lantus SoloStar (100UNIT/ML Soln Pen-inj, Subcutaneous) Active. Furosemide (40MG  Tablet, Oral) Active. OneTouch Verio (In Vitro) Active. Vitamin D (1000UNIT Tablet, Oral) Active. RaNITidine HCl (300MG  Tablet, Oral) Active. Calcium (500MG  Tablet, Oral) Active. Ascorbic Acid (250MG  Tablet Chewable, Oral) Active. Medications Reconciled  Social History  Caffeine use Carbonated beverages, Tea. No alcohol use No drug use Tobacco use Never smoker.  Family History  Alcohol Abuse Brother, Mother, Son. Cerebrovascular Accident Father. Diabetes Mellitus Brother, Sister. Heart Disease Brother, Father. Hypertension Brother, Father, Sister, Son. Thyroid problems Sister.  Pregnancy / Birth History  Age at menarche 36 years. Age of menopause <46 Contraceptive History Contraceptive implant, Depo-provera, Intrauterine device, Oral contraceptives. Gravida 3 Maternal age 55-20 Para 3  Other Problems  Bladder Problems Breast Cancer Cholelithiasis Congestive Heart Failure Diabetes Mellitus Heart murmur Hemorrhoids High blood pressure Hypercholesterolemia Kidney Stone Lump In Breast Other disease, cancer, significant illness Thyroid Disease Ventral Hernia Repair     Review of Systems  General Present-  Fatigue. Not Present- Appetite Loss, Chills, Fever, Night Sweats, Weight Gain and Weight Loss. Skin Present- Change in Wart/Mole. Not Present- Dryness, Hives, Jaundice, New Lesions, Non-Healing Wounds, Rash and Ulcer. HEENT Present- Wears glasses/contact lenses. Not Present- Earache, Hearing Loss, Hoarseness, Nose Bleed, Oral Ulcers, Ringing in the Ears, Seasonal Allergies, Sinus Pain, Sore Throat, Visual Disturbances and Yellow Eyes. Respiratory Not Present- Bloody sputum, Chronic Cough, Difficulty Breathing, Snoring and Wheezing. Cardiovascular Present- Leg Cramps and Swelling of Extremities. Not Present- Chest Pain, Difficulty Breathing Lying Down, Palpitations, Rapid Heart Rate and Shortness of Breath. Gastrointestinal Present- Change in Bowel Habits and Hemorrhoids. Not Present- Abdominal Pain, Bloating, Bloody Stool, Chronic diarrhea, Constipation, Difficulty Swallowing, Excessive gas, Gets full quickly at meals, Indigestion, Nausea, Rectal Pain and Vomiting. Female Genitourinary Present- Frequency, Nocturia and Urgency. Not Present- Painful Urination and Pelvic Pain. Musculoskeletal Not Present- Back Pain, Joint Pain, Joint Stiffness, Muscle Pain, Muscle Weakness and Swelling of Extremities. Neurological Not Present- Decreased Memory, Fainting, Headaches, Numbness, Seizures, Tingling, Tremor, Trouble walking and Weakness. Psychiatric Not Present- Anxiety, Bipolar, Change in Sleep Pattern, Depression, Fearful and Frequent crying. Endocrine Not Present- Cold Intolerance, Excessive Hunger, Hair Changes, Heat Intolerance, Hot flashes and New Diabetes. Hematology Not Present- Blood Thinners, Easy Bruising, Excessive bleeding, Gland problems, HIV and Persistent Infections.  Vitals  Weight: 156.8 lb Height: 66in Body Surface Area: 1.8 m Body Mass Index: 25.31 kg/m  Pulse: 83 (Regular)  BP: 170/80 (Sitting, Left Arm, Standard)      Physical Exam   The physical exam findings  are as follows: Note:WDWN in NAD Eyes: Pupils equal, round; sclera anicteric HENT: Oral mucosa moist; good dentition Neck: No masses palpated, no thyromegaly Chest: healed left mastectomy scar with reconstruction Lungs: CTA bilaterally; normal respiratory effort CV: Regular rate and rhythm; no murmurs; extremities well-perfused with no edema Abd: +bowel sounds, soft, non-tender, no palpable organomegaly; no palpable hernias; healed lower midline incisioin Skin: Warm, dry; no sign of jaundice Psychiatric - alert and oriented x 4; calm mood and affect    Assessment & Plan   CHRONIC CHOLECYSTITIS WITH CALCULUS (K80.10)   ISCHEMIC CARDIOMYOPATHY (I25.5)   CONGESTIVE HEART FAILURE (I50.9)  Current Plans Schedule for Surgery - Laparoscopic cholecystectomy with intraoperative cholangiogram. The surgical procedure has been discussed with the patient. Potential risks, benefits, alternative treatments,  and expected outcomes have been explained. All of the patient's questions at this time have been answered. The likelihood of reaching the patient's treatment goal is good. The patient understand the proposed surgical procedure and wishes to proceed.    Imogene Burn. Georgette Dover, MD, [facility]  General/ Trauma Surgery  01/22/2018 7:12 AM

## 2018-01-22 NOTE — Anesthesia Procedure Notes (Signed)
Procedure Name: Intubation Date/Time: 01/22/2018 7:37 AM Performed by: Imagene Riches, CRNA Pre-anesthesia Checklist: Patient identified, Emergency Drugs available, Suction available and Patient being monitored Patient Re-evaluated:Patient Re-evaluated prior to induction Oxygen Delivery Method: Circle System Utilized Preoxygenation: Pre-oxygenation with 100% oxygen Induction Type: IV induction Ventilation: Mask ventilation without difficulty Laryngoscope Size: Miller and 2 Grade View: Grade I Tube type: Oral Tube size: 7.0 mm Number of attempts: 1 Airway Equipment and Method: Stylet and Oral airway Placement Confirmation: ETT inserted through vocal cords under direct vision,  positive ETCO2 and breath sounds checked- equal and bilateral Secured at: 22 cm Tube secured with: Tape Dental Injury: Teeth and Oropharynx as per pre-operative assessment

## 2018-01-22 NOTE — Transfer of Care (Signed)
Immediate Anesthesia Transfer of Care Note  Patient: Elizabeth Olsen  Procedure(s) Performed: LAPAROSCOPIC CHOLECYSTECTOMY WITH INTRAOPERATIVE CHOLANGIOGRAM (N/A Abdomen)  Patient Location: PACU  Anesthesia Type:General  Level of Consciousness: awake and alert   Airway & Oxygen Therapy: Patient Spontanous Breathing and Patient connected to nasal cannula oxygen  Post-op Assessment: Report given to RN and Post -op Vital signs reviewed and stable  Post vital signs: Reviewed and stable  Last Vitals:  Vitals Value Taken Time  BP    Temp    Pulse 71 01/22/2018  8:51 AM  Resp 5 01/22/2018  8:51 AM  SpO2 100 % 01/22/2018  8:51 AM  Vitals shown include unvalidated device data.  Last Pain:  Vitals:   01/22/18 0643  TempSrc:   PainSc: 0-No pain      Patients Stated Pain Goal: 3 (03/70/48 8891)  Complications: No apparent anesthesia complications

## 2018-01-22 NOTE — Op Note (Signed)
Laparoscopic Cholecystectomy with IOC Procedure Note  Indications: This is a 77 year old female with multiple medical issues including ischemic cardiomyopathy, congestive heart failure, history of left breast cancer status post mastectomy with TRAM flap reconstruction who presents after recent ER visit.  On 11/19/17, the patient experienced sudden onset of nausea, vomiting, diarrhea, and right upper quadrant abdominal pain. She was evaluated in the emergency department and was noted to have gallstones. Liver function tests showed total bilirubin of 1.4, alkaline phosphatase 34, ALT 14, AST 52. Her symptoms resolved spontaneously. She has not had any similar episodes since that time. She does not remember any recent symptoms similar to that day. She is now referred for surgical evaluation.   Pre-operative Diagnosis: Calculus of gallbladder with other cholecystitis, without mention of obstruction  Post-operative Diagnosis: Same  Surgeon: Maia Petties   Assistants: none  Anesthesia: General endotracheal anesthesia  ASA Class: 2  Procedure Details  The patient was seen again in the Holding Room. The risks, benefits, complications, treatment options, and expected outcomes were discussed with the patient. The possibilities of reaction to medication, pulmonary aspiration, perforation of viscus, bleeding, recurrent infection, finding a normal gallbladder, the need for additional procedures, failure to diagnose a condition, the possible need to convert to an open procedure, and creating a complication requiring transfusion or operation were discussed with the patient. The likelihood of improving the patient's symptoms with return to their baseline status is good.  The patient and/or family concurred with the proposed plan, giving informed consent. The site of surgery properly noted. The patient was taken to Operating Room, identified as Elizabeth Olsen and the procedure verified as Laparoscopic  Cholecystectomy with Intraoperative Cholangiogram. A Time Out was held and the above information confirmed.  Prior to the induction of general anesthesia, antibiotic prophylaxis was administered. General endotracheal anesthesia was then administered and tolerated well. After the induction, the abdomen was prepped with Chloraprep and draped in the sterile fashion. The patient was positioned in the supine position.  We began with a 5 mm Optiview port in the left upper quadrant because the patient had previous lower abdominal surgery.  We cannulated the peritoneal cavity with the Optiview trocar and insufflated CO2 maintaining a maximum pressure of 15 mmHg.  The patient has significant adhesions in the lower abdomen beginning at the level of the umbilicus.  We placed 2 5 mm ports in the right upper quadrant.  The original left upper quadrant Optiview port was upsized to an 11 mm port.  We took down the adhesions until we were posterior to the umbilicus.  Local anesthetic agent was injected into the skin above the umbilicus and an incision made. We dissected down to the abdominal fascia with blunt dissection.  There may be a layer of mesh in this area.  The fascia was incised vertically and we entered the peritoneal cavity bluntly.  A pursestring suture of 0-Vicryl was placed around the fascial opening.  All skin incisions were infiltrated with a local anesthetic agent before making the incision and placing the trocars.   We positioned the patient in reverse Trendelenburg, tilted slightly to the patient's left.  The gallbladder was identified, the fundus grasped and retracted cephalad. Adhesions were lysed bluntly and with the electrocautery where indicated, taking care not to injure any adjacent organs or viscus. The infundibulum was grasped and retracted laterally, exposing the peritoneum overlying the triangle of Calot. This was then divided and exposed in a blunt fashion. A critical view of the cystic  duct and  cystic artery was obtained.  The cystic duct was clearly identified and bluntly dissected circumferentially. The cystic duct was ligated with a clip distally.   An incision was made in the cystic duct and the [facility] cholangiogram catheter introduced. The catheter was secured using a clip. A cholangiogram was then obtained which showed good visualization of the distal and proximal biliary tree with no sign of filling defects or obstruction.  Contrast flowed easily into the duodenum. The catheter was then removed.   The cystic duct was then ligated with clips and divided. The cystic artery was identified, dissected free, ligated with clips and divided as well.   The gallbladder was dissected from the liver bed in retrograde fashion with the electrocautery. The gallbladder was removed and placed in an Endocatch sac. The liver bed was irrigated and inspected. Hemostasis was achieved with the electrocautery. Copious irrigation was utilized and was repeatedly aspirated until clear.  The gallbladder and Endocatch sac were then removed through the umbilical port site.  The pursestring suture was used to close the umbilical fascia.    We again inspected the right upper quadrant for hemostasis.  Pneumoperitoneum was released as we removed the trocars.  4-0 Monocryl was used to close the skin.   Benzoin, steri-strips, and clean dressings were applied. The patient was then extubated and brought to the recovery room in stable condition. Instrument, sponge, and needle counts were correct at closure and at the conclusion of the case.   Findings: Cholecystitis with Cholelithiasis  Estimated Blood Loss: Minimal         Drains: none         Specimens: Gallbladder           Complications: None; patient tolerated the procedure well.         Disposition: PACU - hemodynamically stable.         Condition: stable  Imogene Burn. Georgette Dover, MD, [facility] Surgery  General/ Trauma Surgery  01/22/2018 8:44  AM

## 2018-01-22 NOTE — Anesthesia Postprocedure Evaluation (Signed)
Anesthesia Post Note  Patient: Elizabeth Olsen  Procedure(s) Performed: LAPAROSCOPIC CHOLECYSTECTOMY WITH INTRAOPERATIVE CHOLANGIOGRAM (N/A Abdomen)     Patient location during evaluation: PACU Anesthesia Type: General Level of consciousness: awake and alert Pain management: pain level controlled Vital Signs Assessment: post-procedure vital signs reviewed and stable Respiratory status: spontaneous breathing, nonlabored ventilation, respiratory function stable and patient connected to nasal cannula oxygen Cardiovascular status: blood pressure returned to baseline and stable Postop Assessment: no apparent nausea or vomiting Anesthetic complications: no    Last Vitals:  Vitals:   01/22/18 1148 01/22/18 1202  BP: (!) 160/68 130/86  Pulse: 78 85  Resp: 14 17  Temp: 36.7 C 36.7 C  SpO2: 98% 100%    Last Pain:  Vitals:   01/22/18 1212  TempSrc:   PainSc: 0-No pain                 Addalyne Vandehei

## 2018-01-23 ENCOUNTER — Encounter (HOSPITAL_COMMUNITY): Payer: Self-pay | Admitting: Surgery

## 2018-01-23 DIAGNOSIS — K8010 Calculus of gallbladder with chronic cholecystitis without obstruction: Secondary | ICD-10-CM | POA: Diagnosis not present

## 2018-01-23 DIAGNOSIS — E7800 Pure hypercholesterolemia, unspecified: Secondary | ICD-10-CM | POA: Diagnosis not present

## 2018-01-23 DIAGNOSIS — E119 Type 2 diabetes mellitus without complications: Secondary | ICD-10-CM | POA: Diagnosis not present

## 2018-01-23 DIAGNOSIS — I110 Hypertensive heart disease with heart failure: Secondary | ICD-10-CM | POA: Diagnosis not present

## 2018-01-23 DIAGNOSIS — I509 Heart failure, unspecified: Secondary | ICD-10-CM | POA: Diagnosis not present

## 2018-01-23 DIAGNOSIS — R011 Cardiac murmur, unspecified: Secondary | ICD-10-CM | POA: Diagnosis not present

## 2018-01-23 LAB — GLUCOSE, CAPILLARY
GLUCOSE-CAPILLARY: 106 mg/dL — AB (ref 65–99)
GLUCOSE-CAPILLARY: 57 mg/dL — AB (ref 65–99)
Glucose-Capillary: 85 mg/dL (ref 65–99)
Glucose-Capillary: 156 mg/dL — ABNORMAL HIGH (ref 65–99)

## 2018-01-23 MED ORDER — HYDROCODONE-ACETAMINOPHEN 5-325 MG PO TABS: 1.0000 | ORAL_TABLET | ORAL | 0 refills | Status: DC | PRN

## 2018-01-23 MED ORDER — INSULIN ASPART 100 UNIT/ML ~~LOC~~ SOLN
10.0000 [IU] | Freq: Once | SUBCUTANEOUS | Status: AC
  Administered 2018-01-22: 10 [IU] via SUBCUTANEOUS

## 2018-01-23 MED ORDER — INSULIN ASPART 100 UNIT/ML ~~LOC~~ SOLN
20.0000 [IU] | Freq: Once | SUBCUTANEOUS | Status: AC
  Administered 2018-01-23: 20 [IU] via SUBCUTANEOUS

## 2018-01-23 NOTE — Care Management Obs Status (Signed)
[place] NOTIFICATION   Patient Details  Name: Elizabeth Olsen MRN: [id number] Date of Birth: August 16, 1941   Medicare Observation Status Notification Given:  Yes    Ella Bodo, RN 01/23/2018, 10:22 AM

## 2018-01-23 NOTE — Progress Notes (Signed)
2200 Pt's blood sugar at 407, Notified MD on call, Order to give 10 units 2330 Rechecked BS, 357, Notified MD on call, ordered 20 units. Will monitor pt.

## 2018-01-23 NOTE — Discharge Instructions (Signed)
[facility] ______[facility] °LAPAROSCOPIC SURGERY: POST OP INSTRUCTIONS °Always review your discharge instruction sheet given to you by the facility where your surgery was performed. °IF YOU HAVE DISABILITY OR FAMILY LEAVE FORMS, YOU MUST BRING THEM TO THE OFFICE FOR PROCESSING.   °DO NOT GIVE THEM TO YOUR DOCTOR. ° °1. A prescription for pain medication may be given to you upon discharge.  Take your pain medication as prescribed, if needed.  If narcotic pain medicine is not needed, then you may take acetaminophen (Tylenol) or ibuprofen (Advil) as needed. °2. Take your usually prescribed medications unless otherwise directed. °3. If you need a refill on your pain medication, please contact your pharmacy.  They will contact our office to request authorization. Prescriptions will not be filled after 5pm or on week-ends. °4. You should follow a light diet the first few days after arrival home, such as soup and crackers, etc.  Be sure to include lots of fluids daily. °5. Most patients will experience some swelling and bruising in the area of the incisions.  Ice packs will help.  Swelling and bruising can take several days to resolve.  °6. It is common to experience some constipation if taking pain medication after surgery.  Increasing fluid intake and taking a stool softener (such as Colace) will usually help or prevent this problem from occurring.  A mild laxative (Milk of Magnesia or Miralax) should be taken according to package instructions if there are no bowel movements after 48 hours. °7. Unless discharge instructions indicate otherwise, you may remove your bandages 24-48 hours after surgery, and you may shower at that time.  You may have steri-strips (small skin tapes) in place directly over the incision.  These strips should be left on the skin for 7-10 days.  If your surgeon used skin glue on the incision, you may shower in 24 hours.  The glue will flake off over the next 2-3 weeks.  Any sutures or  staples will be removed at the office during your follow-up visit. °8. ACTIVITIES:  You may resume regular (light) daily activities beginning the next day--such as daily self-care, walking, climbing stairs--gradually increasing activities as tolerated.  You may have sexual intercourse when it is comfortable.  Refrain from any heavy lifting or straining until approved by your doctor. °a. You may drive when you are no longer taking prescription pain medication, you can comfortably wear a seatbelt, and you can safely maneuver your car and apply brakes. °b. RETURN TO WORK:  __________________________________________________________ °9. You should see your doctor in the office for a follow-up appointment approximately 2-3 weeks after your surgery.  Make sure that you call for this appointment within a day or two after you arrive home to insure a convenient appointment time. °10. OTHER INSTRUCTIONS: __________________________________________________________________________________________________________________________ __________________________________________________________________________________________________________________________ °WHEN TO CALL YOUR DOCTOR: °1. Fever over 101.0 °2. Inability to urinate °3. Continued bleeding from incision. °4. Increased pain, redness, or drainage from the incision. °5. Increasing abdominal pain ° °The clinic staff is available to answer your questions during regular business hours.  Please don’t hesitate to call and ask to speak to one of the nurses for clinical concerns.  If you have a medical emergency, go to the nearest emergency room or call 911.  A surgeon from [facility] is always on call at the hospital. °[address], [address], [place], [place]  [address] ? [address] [address], [place], [place]   [address] °[phone] ? [phone] ? FAX [phone] °Web site:   [email or web address] °

## 2018-01-23 NOTE — Progress Notes (Signed)
Hypoglycemic Event  CBG: 57  Treatment: 15 GM carbohydrate snack  Symptoms: Shaky  Follow-up CBG: Time:0547CBG Result:85   Possible Reasons for Event:   Comments/MD notified:no    Elizabeth Olsen

## 2018-01-23 NOTE — Discharge Summary (Signed)
Physician Discharge Summary  Patient ID: Elizabeth Olsen MRN: [id number] DOB/AGE: 06/27/1941 77 y.o.  Admit date: 01/22/2018 Discharge date: 01/23/2018  Admission Diagnoses:  Chronic calculus cholecystitis      Diabetes  Discharge Diagnoses: same Active Problems:   Chronic cholecystitis with calculus   Discharged Condition: good  Hospital Course: Lap chole with IOC on 01/22/18.  Kept overnight for observation due to cardiac issues and comorbidities.  Had some issues overnight with blood sugars, but feeling much better now.  Tolerating diet.  No pain.  Consults: None   Treatments: surgery: lap chole with IOC  Discharge Exam: Blood pressure 135/67, pulse 79, temperature 97.9 F (36.6 C), temperature source Oral, resp. rate 17, SpO2 100 %. General appearance: alert, cooperative and no distress GI: soft, minimally tender Incisions c/d/i  Disposition: Discharge disposition: 01-Home or Self Care       Discharge Instructions    Call MD for:  persistant nausea and vomiting   Complete by:  As directed    Call MD for:  redness, tenderness, or signs of infection (pain, swelling, redness, odor or green/yellow discharge around incision site)   Complete by:  As directed    Call MD for:  severe uncontrolled pain   Complete by:  As directed    Call MD for:  temperature >100.4   Complete by:  As directed    Diet general   Complete by:  As directed    Driving Restrictions   Complete by:  As directed    Do not drive while taking pain medications   Increase activity slowly   Complete by:  As directed    May shower / Bathe   Complete by:  As directed      Allergies as of 01/23/2018      Reactions   Erythromycin Shortness Of Breath, Swelling   "throat swelling", sob, thrashing ,    Penicillins Rash   Has patient had a PCN reaction causing immediate rash, facial/tongue/throat swelling, SOB or lightheadedness with hypotension: Unknown Has patient had a PCN reaction causing severe  rash involving mucus membranes or skin necrosis: No Has patient had a PCN reaction that required hospitalization: No Has patient had a PCN reaction occurring within the last 10 years: No If all of the above answers are "NO", then may proceed with Cephalosporin use.   Nyquil [pseudoeph-doxylamine-dm-apap] Rash   Sulfa Antibiotics Rash      Medication List    TAKE these medications   atorvastatin 10 MG tablet Commonly known as:  LIPITOR Take 1 tablet (10 mg total) by mouth daily.   calcium carbonate 1500 (600 Ca) MG Tabs tablet Commonly known as:  OSCAL Take 600 mg by mouth daily with breakfast.   carvedilol 6.25 MG tablet Commonly known as:  COREG Take 1 tablet (6.25 mg total) by mouth 2 (two) times daily.   fenofibrate 160 MG tablet Take 160 mg by mouth daily.   fish oil-omega-3 fatty acids 1000 MG capsule Take 1 g by mouth daily.   furosemide 20 MG tablet Commonly known as:  LASIX Take 3 tablets (60 mg total) by mouth daily.   HUMALOG KWIKPEN 100 UNIT/ML KiwkPen Generic drug:  insulin lispro Inject 5-20 Units into the skin 3 (three) times daily. Sliding Scale   HYDROcodone-acetaminophen 5-325 MG tablet Commonly known as:  NORCO/VICODIN Take 1 tablet by mouth every 4 (four) hours as needed for moderate pain.   iron polysaccharides 150 MG capsule Commonly known as:  NIFEREX Take 150  mg by mouth 2 (two) times daily.   LANTUS SOLOSTAR 100 UNIT/ML Solostar Pen Generic drug:  Insulin Glargine Inject 7 Units into the skin 2 (two) times daily.   levothyroxine 112 MCG tablet Commonly known as:  SYNTHROID, LEVOTHROID Take 112 mcg by mouth See admin instructions. Take 1 tablet (112 mcg) by mouth on 6 days of the week; hold on Sundays.   losartan 100 MG tablet Commonly known as:  COZAAR Take 50 mg by mouth daily.   multivitamin with minerals tablet Take 1 tablet by mouth daily.   MUSCLE RUB 10-15 % Crea Apply 1 application topically 4 (four) times daily as needed for  muscle pain.   ONETOUCH VERIO test strip Generic drug:  glucose blood USE TO TEST UP TO 10 TIMES D   OVER THE COUNTER MEDICATION Take 5 mLs by mouth daily.   ranitidine 300 MG capsule Commonly known as:  ZANTAC Take 300 mg by mouth 2 (two) times daily.   vitamin C 500 MG tablet Commonly known as:  ASCORBIC ACID Take 500 mg by mouth daily.   Vitamin D (Ergocalciferol) 50000 units Caps capsule Commonly known as:  DRISDOL Take 50,000 Units by mouth 2 (two) times a week. Sundays & Wednesdays.      Follow-up Information    Donnie Mesa, MD. Schedule an appointment as soon as possible for a visit in 3 week(s).   Specialty:  General Surgery Contact information: [address] [place] [address] [phone]           Signed: Maia Petties 01/23/2018, 7:33 AM

## 2018-02-12 DIAGNOSIS — H81393 Other peripheral vertigo, bilateral: Secondary | ICD-10-CM | POA: Diagnosis not present

## 2018-02-18 DIAGNOSIS — M62830 Muscle spasm of back: Secondary | ICD-10-CM | POA: Diagnosis not present

## 2018-02-18 DIAGNOSIS — M546 Pain in thoracic spine: Secondary | ICD-10-CM | POA: Diagnosis not present

## 2018-02-18 DIAGNOSIS — M9901 Segmental and somatic dysfunction of cervical region: Secondary | ICD-10-CM | POA: Diagnosis not present

## 2018-02-18 DIAGNOSIS — M9902 Segmental and somatic dysfunction of thoracic region: Secondary | ICD-10-CM | POA: Diagnosis not present

## 2018-02-19 DIAGNOSIS — E113312 Type 2 diabetes mellitus with moderate nonproliferative diabetic retinopathy with macular edema, left eye: Secondary | ICD-10-CM | POA: Diagnosis not present

## 2018-02-19 NOTE — Progress Notes (Signed)
[facility]  [phone]) [phone] [phone]  Clinic Follow Up Note   Patient Care Team: [name], MD as PCP - General (Family Medicine) Reynold Bowen, MD as Consulting Physician (Endocrinology) 02/20/2018  CHIEF COMPLAINTS:  F/u anemia of chronic disease   HISTORY OF PRESENTING ILLNESS (11/07/2017):  Elizabeth Olsen 77 y.o. female is here because of  anemia. She was referred by Dr. Alyson Ingles at [facility] for her worsening microcytic hypochromic anemia despite iron supplementation. Her Hgb was 9, iron sat 18% and her iron level was 58 per most recent labs on 10/09/17. Pt states she has been diagnosed as anemic for over a year. Pt reports increasing fatigue over the past year and states doing errands and chores is sometimes difficult. Pt has some leg swelling that she treats with Lasix. Pt denies night sweats.   Pt has a PMHx of CHF, DM, Breast CA. She is treated by a cardiologist, Dr. Agustin Cree and her DM is monitored by her PCP, Dr. Alyson Ingles. Pt's CA was treated by Dr. Benay Spice in 1998. Pt underwent a left breast mastectomy and chemotherapy. She does not currently take any AEOT. She does get yearly screening mammograms. No other Fhx of CA. No tobacco use.   On review of systems, pt denies fever, chills, rash, mouth sores, decreased appetite, urinary complaints. Denies pain. Pt denies abdominal pain, nausea, vomiting. Pertinent positives are listed and detailed within the above HPI.   CURRENT THERAPY: Aranesp injection monthly starting 01/01/18  INTERVAL HISTORY:  Elizabeth Olsen is here for follow up after starting Aranesp injection. She presents to the clinic today by herself. She reports she is doing well and has recovered from her cholecystectomy. She states she did well with Aranesp injection but she did not notice any difference.   On review of systems, pt denies any other complaints at this time. Pertinent positives are listed and detailed within the  above HPI.   MEDICAL HISTORY:  Past Medical History:  Diagnosis Date  . Anemia   . Blood transfusion   . Blood transfusion without reported diagnosis    with breast surgery  . Breast cancer ([place]) 15 years ago   left   . CHF (congestive heart failure) ([place])   . 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 Diabetes mellitus    dx 1998.  was told prior to getting chemo that her bld sugar rose.  She thought it would go back  . Dyspnea    d/t anemia  . GERD (gastroesophageal reflux disease)   . Heart murmur    as child  . Hernia, incisional   . Hyperlipidemia   . Hypertension   . Hypothyroidism   . Vitamin D deficiency     SURGICAL HISTORY: Past Surgical History:  Procedure Laterality Date  . ABDOMINAL HYSTERECTOMY    . APPENDECTOMY    . BREAST SURGERY    . CHOLECYSTECTOMY  01/22/2018   LAPROSCOPIC   . CHOLECYSTECTOMY N/A 01/22/2018   Procedure: LAPAROSCOPIC CHOLECYSTECTOMY WITH INTRAOPERATIVE CHOLANGIOGRAM;  Surgeon: Donnie Mesa, MD;  Location: [facility];  Service: General;  Laterality: N/A;  . COLONOSCOPY  2013   due next 03-2017  . EYE SURGERY     bil cataracts  . IUD REMOVAL     with appendectomy  . MASTECTOMY, MODIFIED RADICAL W/RECONSTRUCTION Left 15 years ago   10 nodes out  . TUMOR EXCISION Left    x 2, neck, head    SOCIAL HISTORY: Social History  Socioeconomic History  . Marital status: Married    Spouse name: Not on file  . Number of children: 3  . Years of education: Not on file  . Highest education level: Not on file  Occupational History  . Occupation: homemaker  Social Needs  . Financial resource strain: Not on file  . Food insecurity:    Worry: Not on file    Inability: Not on file  . Transportation needs:    Medical: Not on file    Non-medical: Not on file  Tobacco Use  . Smoking status: Never Smoker  . Smokeless tobacco: Never Used  Substance and Sexual Activity  . Alcohol use: No  . Drug use: No  . Sexual activity: Not on file  Lifestyle    . Physical activity:    Days per week: Not on file    Minutes per session: Not on file  . Stress: Not on file  Relationships  . Social connections:    Talks on phone: Not on file    Gets together: Not on file    Attends religious service: Not on file    Active member of club or organization: Not on file    Attends meetings of clubs or organizations: Not on file    Relationship status: Not on file  . Intimate partner violence:    Fear of current or ex partner: Not on file    Emotionally abused: Not on file    Physically abused: Not on file    Forced sexual activity: Not on file  Other Topics Concern  . Not on file  Social History Narrative  . Not on file    FAMILY HISTORY: Family History  Problem Relation Age of Onset  . Diabetes Other        both sides of family  . Hypertension Father   . Congestive Heart Failure Father   . Peripheral vascular disease Father   . Hypertension Maternal Grandfather   . Hypertension Maternal Grandmother   . Stomach cancer Maternal Grandmother        GGM  . Heart attack Brother   . Colon cancer Neg Hx   . Esophageal cancer Neg Hx   . Pancreatic cancer Neg Hx   . Rectal cancer Neg Hx     ALLERGIES:  is allergic to erythromycin; penicillins; nyquil [pseudoeph-doxylamine-dm-apap]; and sulfa antibiotics.  MEDICATIONS:  Current Outpatient Medications  Medication Sig Dispense Refill  . atorvastatin (LIPITOR) 10 MG tablet Take 1 tablet (10 mg total) by mouth daily. 30 tablet 11  . calcium carbonate (OSCAL) 1500 (600 Ca) MG TABS tablet Take 600 mg by mouth daily with breakfast.     . carvedilol (COREG) 6.25 MG tablet Take 1 tablet (6.25 mg total) by mouth 2 (two) times daily. 60 tablet 11  . fenofibrate 160 MG tablet Take 160 mg by mouth daily.    . fish oil-omega-3 fatty acids 1000 MG capsule Take 1 g by mouth daily.     . furosemide (LASIX) 20 MG tablet Take 3 tablets (60 mg total) by mouth daily. 270 tablet 3  . HYDROcodone-acetaminophen  (NORCO/VICODIN) 5-325 MG tablet Take 1 tablet by mouth every 4 (four) hours as needed for moderate pain. 12 tablet 0  . Insulin Glargine (LANTUS SOLOSTAR) 100 UNIT/ML Solostar Pen Inject 7 Units into the skin 2 (two) times daily.    . insulin lispro (HUMALOG KWIKPEN) 100 UNIT/ML KiwkPen Inject 5-20 Units into the skin 3 (three) times daily. Sliding Scale    .  iron polysaccharides (NIFEREX) 150 MG capsule Take 150 mg by mouth 2 (two) times daily.     Marland Kitchen levothyroxine (SYNTHROID, LEVOTHROID) 112 MCG tablet Take 112 mcg by mouth See admin instructions. Take 1 tablet (112 mcg) by mouth on 6 days of the week; hold on Sundays.    Marland Kitchen losartan (COZAAR) 100 MG tablet Take 50 mg by mouth daily.     . Menthol-Methyl Salicylate (MUSCLE RUB) 10-15 % CREA Apply 1 application topically 4 (four) times daily as needed for muscle pain.     . Multiple Vitamins-Minerals (MULTIVITAMIN WITH MINERALS) tablet Take 1 tablet by mouth daily.    Glory Rosebush VERIO test strip USE TO TEST UP TO 10 TIMES D  12  . OVER THE COUNTER MEDICATION Take 5 mLs by mouth daily.     . ranitidine (ZANTAC) 300 MG capsule Take 300 mg by mouth 2 (two) times daily.     . vitamin C (ASCORBIC ACID) 500 MG tablet Take 500 mg by mouth daily.    . Vitamin D, Ergocalciferol, (DRISDOL) 50000 UNITS CAPS Take 50,000 Units by mouth 2 (two) times a week. Sundays & Wednesdays.     No current facility-administered medications for this visit.     REVIEW OF SYSTEMS:   Constitutional: Denies fevers, chills or abnormal night sweats (+) fatigue Eyes: Denies blurriness of vision, double vision or watery eyes Ears, nose, mouth, throat, and face: Denies mucositis or sore throat Respiratory: Denies cough, dyspnea or wheezes Cardiovascular: Denies palpitation, chest discomfort (+) lower extremity swelling Gastrointestinal:  Denies nausea, heartburn or change in bowel habits Skin: Denies abnormal skin rashes Lymphatics: Denies new lymphadenopathy or easy  bruising Neurological:Denies numbness, tingling or new weaknesses Behavioral/Psych: Mood is stable, no new changes  All other systems were reviewed with the patient and are negative.  PHYSICAL EXAMINATION: ECOG PERFORMANCE STATUS: 1 - Symptomatic but completely ambulatory Weight 150.8 pounds, blood pressure 157/45, pulse 81, respiratory rate 18, temperature 97.9, pulse ox 100% on room air GENERAL:alert, no distress and comfortable SKIN: skin color, texture, turgor are normal, no rashes or significant lesions EYES: normal, conjunctiva are pink and non-injected, sclera clear OROPHARYNX:no exudate, no erythema and lips, buccal mucosa, and tongue normal  NECK: supple, thyroid normal size, non-tender, without nodularity LYMPH:  no palpable lymphadenopathy in the cervical, axillary or inguinal LUNGS: clear to auscultation and percussion with normal breathing effort HEART: regular rate & rhythm and no murmurs and no lower extremity edema ABDOMEN:abdomen soft, non-tender and normal bowel sounds (+) she has 4 laparoscopic incisions that are healed well  Musculoskeletal:no cyanosis of digits and no clubbing  PSYCH: alert & oriented x 3 with fluent speech NEURO: no focal motor/sensory deficits  LABORATORY DATA:  I have reviewed the data as listed CBC Latest Ref Rng & Units 02/20/2018 01/22/2018 01/18/2018  WBC 3.9 - 10.3 K/uL 9.9 6.3 6.4  Hemoglobin 11.6 - 15.9 g/dL 9.4(L) 9.7(L) 10.1(L)  Hematocrit 34.8 - 46.6 % 28.0(L) 30.5(L) 31.7(L)  Platelets 145 - 400 K/uL 343 332 324    CMP Latest Ref Rng & Units 01/22/2018 01/18/2018 12/26/2017  Glucose 65 - 99 mg/dL - 146(H) 155(H)  BUN 6 - 20 mg/dL - 36(H) 33(H)  Creatinine 0.44 - 1.00 mg/dL 1.26(H) 1.34(H) 1.30(H)  Sodium 135 - 145 mmol/L - 135 137  Potassium 3.5 - 5.1 mmol/L - 4.0 5.2(H)  Chloride 101 - 111 mmol/L - 97(L) 97(L)  CO2 22 - 32 mmol/L - 28 31(H)  Calcium 8.9 - 10.3 mg/dL -  9.4 9.7  Total Protein 6.4 - 8.3 g/dL - - 6.9  Total Bilirubin  0.2 - 1.2 mg/dL - - 0.4  Alkaline Phos 40 - 150 U/L - - 47  AST 5 - 34 U/L - - 23  ALT 0 - 55 U/L - - 14   PROCEDURE: Colonoscopy 08/09/17 FINDINGS - The perianal and digital rectal examinations were normal. - A 7 mm polyp was found in the descending colon. The polyp was sessile. The polyp was removed with a cold snare. Resection and retrieval were complete. - Internal hemorrhoids were found during retroflexion. The hemorrhoids were small and Grade I (internal hemorrhoids that do not prolapse). - The exam was otherwise without abnormality on direct and retroflexion views.  PATHOLOGY  Diagnosis 11/21/17 Bone Marrow, Aspirate,Biopsy, and Clot - HYPERCELLULAR BONE MARROW WITH GRANULOCYTIC HYPERPLASIA. - SEVERAL LYMPHOID AGGREGATES PRESENT. - SEE COMMENT. PERIPHERAL BLOOD: - NORMOCYTIC-NORMOCHROMIC ANEMIA. Diagnosis Note The bone marrow is hypercellular for age with granulocytic hyperplasia. Significant dyspoiesis is not present in myeloid cell lines and hence the changes are likely secondary in nature. In this background, there are several interstitial lymphoid aggregates mostly composed of small lymphoid cells. Immunohistochemical stains and flow cytometric analysis failed to show any phenotypic abnormalities most consistent with a reactive lymphoid process. Correlation with cytogenetic studies is recommended. (BNS:gt, 2017/12/11) Interpretation 11/21/17 Bone Marrow Flow Cytometry - NO MONOCLONAL B-CELL POPULATION OR ABNORMAL T-CELL PHENOTYPE IDENTIFIED.  Cytogenetic Analysis 11/21/17 Normal: Cytogenetic analysis revealed the presence of normal female chromosomes with no observable clonal chromosomal abnormalities.   RADIOGRAPHIC STUDIES: I have personally reviewed the radiological images as listed and agreed with the findings in the report. Dg Cholangiogram Operative  Result Date: 01/22/2018 CLINICAL DATA:  77 year old female with a history of cholelithiasis EXAM: INTRAOPERATIVE  CHOLANGIOGRAM TECHNIQUE: Cholangiographic images from the C-arm fluoroscopic device were submitted for interpretation post-operatively. Please see the procedural report for the amount of contrast and the fluoroscopy time utilized. COMPARISON:  CT 11/20/2017 FINDINGS: Surgical instruments project over the upper abdomen. There is cannulation of the cystic duct/gallbladder neck, with antegrade infusion of contrast. Caliber of the extrahepatic ductal system within normal limits. No definite filling defect within the extrahepatic ducts identified. Free flow of contrast across the ampulla. IMPRESSION: Intraoperative cholangiogram demonstrates extrahepatic biliary ducts of unremarkable caliber, with no definite filling defects identified. Free flow of contrast across the ampulla. Please refer to the dictated operative report for full details of intraoperative findings and procedure Electronically Signed   By: Corrie Mckusick D.O.   On: 01/22/2018 09:40    ASSESSMENT & PLAN:  No problem-specific Assessment & Plan notes found for this encounter.  1. Anemia of chronic diseases  -I previously reviewed her previous CBC and anemia workup, she had normal ferritin, iron study, M84 and folic acid in 1324, ret was low, normal MCV.  No lab evidence for hemolysis or multiple myeloma.  Her erythropoietin level was normal. -She does have chronic disease including uncontrolled diabetes, congestive heart failure, and mild CKD, which can cause anemia of chronic disease --Pt underwent a bone marrow biopsy in 11/21/17 with Mendel Ryder.  I previously discussed her bone marrow biopsy with pathologist Dr. Gari Crown, she does have hypercellular bone marrow, with granulocytic hyperplasia, no evidence of dysplasia.  We feel her pulmonary change is likely secondary, no evidence of MDS or other primary marrow disease.  I think this is consistent with anemia of chronic disease.  -Since her Hgb is below 9 most of time, I recommended Aranesp injection  monthly. - She started Aranesp injection on 01/01/18. Hgb increased to 10.1 on her pre-op labs  -Labs reviewed today, Hgb is 9.4. She will have injection today and every month -F/u in 3 months and I will consider changing her injection dose if needed   2. CHF -Pt sees a cardiologist, Dr. Agustin Cree regularly. She takes 40 mg Lasix   3. DM - Follow up with PCP, Dr. Alyson Ingles for monitoring -I advised her that if her DM is well controlled, this will improve her other comorbidities.   4. Hyperkalemia  -Her K is 5.2  (12/26/17), she does not take a K supplement but she will check her multivitamin to see if it contains K, if so she will hold.  -I advised her on foods to avoid with high amount of K  5. Stage III CKD -Likely secondary to her diabetes.   -Her cr is 1.3 and her GFRs in in 40's. I previously advised her to drink plenty of fluids and to control her diabetes.   PLAN: -Aranesp injection today -Lab and injection every months  -F/u in 3 months and I will titrated dose if needed   All questions were answered. The patient knows to call the clinic with any problems, questions or concerns . I spent 15 counseling the patient face to face. The total time spent in the appointment was 20 and more than 50% was on counseling.  This document serves as a record of services personally performed by Truitt Merle, MD. It was created on her behalf by Theresia Bough, a trained medical scribe. The creation of this record is based on the scribe's personal observations and the provider's statements to them.   I have reviewed the above documentation for accuracy and completeness, and I agree with the above.    Truitt Merle, MD 02/20/18

## 2018-02-20 ENCOUNTER — Encounter: Payer: Self-pay | Admitting: Hematology

## 2018-02-20 ENCOUNTER — Inpatient Hospital Stay: Payer: Medicare Other | Attending: Hematology | Admitting: Hematology

## 2018-02-20 ENCOUNTER — Telehealth: Payer: Self-pay | Admitting: Hematology

## 2018-02-20 ENCOUNTER — Inpatient Hospital Stay: Payer: Medicare Other

## 2018-02-20 VITALS — BP 157/45 | HR 81 | Temp 97.6°F | Resp 18

## 2018-02-20 DIAGNOSIS — I10 Essential (primary) hypertension: Secondary | ICD-10-CM | POA: Diagnosis not present

## 2018-02-20 DIAGNOSIS — E1149 Type 2 diabetes mellitus with other diabetic neurological complication: Secondary | ICD-10-CM | POA: Diagnosis not present

## 2018-02-20 DIAGNOSIS — D638 Anemia in other chronic diseases classified elsewhere: Secondary | ICD-10-CM

## 2018-02-20 DIAGNOSIS — E7849 Other hyperlipidemia: Secondary | ICD-10-CM | POA: Diagnosis not present

## 2018-02-20 DIAGNOSIS — H3520 Other non-diabetic proliferative retinopathy, unspecified eye: Secondary | ICD-10-CM | POA: Diagnosis not present

## 2018-02-20 DIAGNOSIS — D6489 Other specified anemias: Secondary | ICD-10-CM | POA: Diagnosis not present

## 2018-02-20 DIAGNOSIS — E1122 Type 2 diabetes mellitus with diabetic chronic kidney disease: Secondary | ICD-10-CM | POA: Diagnosis not present

## 2018-02-20 DIAGNOSIS — I509 Heart failure, unspecified: Secondary | ICD-10-CM

## 2018-02-20 DIAGNOSIS — K769 Liver disease, unspecified: Secondary | ICD-10-CM | POA: Diagnosis not present

## 2018-02-20 DIAGNOSIS — E1142 Type 2 diabetes mellitus with diabetic polyneuropathy: Secondary | ICD-10-CM | POA: Diagnosis not present

## 2018-02-20 DIAGNOSIS — Z6827 Body mass index (BMI) 27.0-27.9, adult: Secondary | ICD-10-CM | POA: Diagnosis not present

## 2018-02-20 DIAGNOSIS — I420 Dilated cardiomyopathy: Secondary | ICD-10-CM | POA: Diagnosis not present

## 2018-02-20 DIAGNOSIS — N183 Chronic kidney disease, stage 3 (moderate): Secondary | ICD-10-CM | POA: Diagnosis not present

## 2018-02-20 DIAGNOSIS — Z794 Long term (current) use of insulin: Secondary | ICD-10-CM | POA: Diagnosis not present

## 2018-02-20 DIAGNOSIS — E875 Hyperkalemia: Secondary | ICD-10-CM | POA: Diagnosis not present

## 2018-02-20 DIAGNOSIS — E038 Other specified hypothyroidism: Secondary | ICD-10-CM | POA: Diagnosis not present

## 2018-02-20 LAB — CBC WITH DIFFERENTIAL (CANCER CENTER ONLY)
BASOS ABS: 0 10*3/uL (ref 0.0–0.1)
BASOS PCT: 0 %
Eosinophils Absolute: 0.2 10*3/uL (ref 0.0–0.5)
Eosinophils Relative: 2 %
HEMATOCRIT: 28 % — AB (ref 34.8–46.6)
Hemoglobin: 9.4 g/dL — ABNORMAL LOW (ref 11.6–15.9)
LYMPHS PCT: 15 %
Lymphs Abs: 1.5 10*3/uL (ref 0.9–3.3)
MCH: 29.6 pg (ref 25.1–34.0)
MCHC: 33.5 g/dL (ref 31.5–36.0)
MCV: 88.4 fL (ref 79.5–101.0)
Monocytes Absolute: 0.9 10*3/uL (ref 0.1–0.9)
Monocytes Relative: 9 %
NEUTROS ABS: 7.2 10*3/uL — AB (ref 1.5–6.5)
NEUTROS PCT: 74 %
Platelet Count: 343 10*3/uL (ref 145–400)
RBC: 3.17 MIL/uL — AB (ref 3.70–5.45)
RDW: 13.8 % (ref 11.2–14.5)
WBC: 9.9 10*3/uL (ref 3.9–10.3)

## 2018-02-20 MED ORDER — DARBEPOETIN ALFA 100 MCG/0.5ML IJ SOSY
100.0000 ug | PREFILLED_SYRINGE | Freq: Once | INTRAMUSCULAR | Status: AC
  Administered 2018-02-20: 100 ug via SUBCUTANEOUS
  Filled 2018-02-20: qty 0.5

## 2018-02-20 NOTE — Telephone Encounter (Signed)
Appointments scheduled Calendar/letter printed and mailed to patient per 4/24 los

## 2018-02-21 DIAGNOSIS — M9902 Segmental and somatic dysfunction of thoracic region: Secondary | ICD-10-CM | POA: Diagnosis not present

## 2018-02-21 DIAGNOSIS — M62830 Muscle spasm of back: Secondary | ICD-10-CM | POA: Diagnosis not present

## 2018-02-21 DIAGNOSIS — M9901 Segmental and somatic dysfunction of cervical region: Secondary | ICD-10-CM | POA: Diagnosis not present

## 2018-02-21 DIAGNOSIS — M546 Pain in thoracic spine: Secondary | ICD-10-CM | POA: Diagnosis not present

## 2018-02-27 DIAGNOSIS — M9901 Segmental and somatic dysfunction of cervical region: Secondary | ICD-10-CM | POA: Diagnosis not present

## 2018-02-27 DIAGNOSIS — M9902 Segmental and somatic dysfunction of thoracic region: Secondary | ICD-10-CM | POA: Diagnosis not present

## 2018-02-27 DIAGNOSIS — M62830 Muscle spasm of back: Secondary | ICD-10-CM | POA: Diagnosis not present

## 2018-02-27 DIAGNOSIS — M546 Pain in thoracic spine: Secondary | ICD-10-CM | POA: Diagnosis not present

## 2018-02-28 DIAGNOSIS — M9902 Segmental and somatic dysfunction of thoracic region: Secondary | ICD-10-CM | POA: Diagnosis not present

## 2018-02-28 DIAGNOSIS — M546 Pain in thoracic spine: Secondary | ICD-10-CM | POA: Diagnosis not present

## 2018-02-28 DIAGNOSIS — M62830 Muscle spasm of back: Secondary | ICD-10-CM | POA: Diagnosis not present

## 2018-02-28 DIAGNOSIS — M9901 Segmental and somatic dysfunction of cervical region: Secondary | ICD-10-CM | POA: Diagnosis not present

## 2018-03-20 ENCOUNTER — Inpatient Hospital Stay: Payer: Medicare Other

## 2018-03-20 ENCOUNTER — Inpatient Hospital Stay: Payer: Medicare Other | Attending: Hematology

## 2018-03-20 VITALS — BP 128/62 | HR 74 | Temp 98.5°F | Resp 17

## 2018-03-20 DIAGNOSIS — N183 Chronic kidney disease, stage 3 (moderate): Secondary | ICD-10-CM | POA: Insufficient documentation

## 2018-03-20 DIAGNOSIS — D638 Anemia in other chronic diseases classified elsewhere: Secondary | ICD-10-CM

## 2018-03-20 LAB — CBC WITH DIFFERENTIAL (CANCER CENTER ONLY)
BASOS ABS: 0 10*3/uL (ref 0.0–0.1)
BASOS PCT: 1 %
Eosinophils Absolute: 0.2 10*3/uL (ref 0.0–0.5)
Eosinophils Relative: 3 %
HCT: 30.4 % — ABNORMAL LOW (ref 34.8–46.6)
Hemoglobin: 10 g/dL — ABNORMAL LOW (ref 11.6–15.9)
LYMPHS PCT: 29 %
Lymphs Abs: 1.7 10*3/uL (ref 0.9–3.3)
MCH: 29 pg (ref 25.1–34.0)
MCHC: 33 g/dL (ref 31.5–36.0)
MCV: 87.9 fL (ref 79.5–101.0)
MONO ABS: 0.7 10*3/uL (ref 0.1–0.9)
Monocytes Relative: 13 %
Neutro Abs: 3.2 10*3/uL (ref 1.5–6.5)
Neutrophils Relative %: 54 %
PLATELETS: 384 10*3/uL (ref 145–400)
RBC: 3.46 MIL/uL — AB (ref 3.70–5.45)
RDW: 14.3 % (ref 11.2–14.5)
WBC: 5.9 10*3/uL (ref 3.9–10.3)

## 2018-03-20 MED ORDER — DARBEPOETIN ALFA 100 MCG/0.5ML IJ SOSY
100.0000 ug | PREFILLED_SYRINGE | Freq: Once | INTRAMUSCULAR | Status: AC
  Administered 2018-03-20: 100 ug via SUBCUTANEOUS
  Filled 2018-03-20: qty 0.5

## 2018-03-20 NOTE — Patient Instructions (Signed)

## 2018-04-01 ENCOUNTER — Telehealth: Payer: Self-pay | Admitting: *Deleted

## 2018-04-01 ENCOUNTER — Ambulatory Visit (INDEPENDENT_AMBULATORY_CARE_PROVIDER_SITE_OTHER): Payer: Medicare Other | Admitting: Cardiology

## 2018-04-01 ENCOUNTER — Encounter: Payer: Self-pay | Admitting: Cardiology

## 2018-04-01 VITALS — BP 174/74 | HR 73 | Ht 62.5 in | Wt 150.0 lb

## 2018-04-01 DIAGNOSIS — E1139 Type 2 diabetes mellitus with other diabetic ophthalmic complication: Secondary | ICD-10-CM

## 2018-04-01 DIAGNOSIS — R55 Syncope and collapse: Secondary | ICD-10-CM | POA: Insufficient documentation

## 2018-04-01 DIAGNOSIS — Z794 Long term (current) use of insulin: Secondary | ICD-10-CM | POA: Diagnosis not present

## 2018-04-01 DIAGNOSIS — E782 Mixed hyperlipidemia: Secondary | ICD-10-CM | POA: Diagnosis not present

## 2018-04-01 DIAGNOSIS — I420 Dilated cardiomyopathy: Secondary | ICD-10-CM | POA: Diagnosis not present

## 2018-04-01 HISTORY — DX: Syncope and collapse: R55

## 2018-04-01 NOTE — Patient Instructions (Signed)
Medication Instructions:  Your physician recommends that you continue on your current medications as directed. Please refer to the Current Medication list given to you today.   Labwork: None  Testing/Procedures: Your physician has recommended that you wear a holter monitor. Holter monitors are medical devices that record the heart's electrical activity. Doctors most often use these monitors to diagnose arrhythmias. Arrhythmias are problems with the speed or rhythm of the heartbeat. The monitor is a small, portable device. You can wear one while you do your normal daily activities. This is usually used to diagnose what is causing palpitations/syncope (passing out). Wear for 7 days.    Follow-Up: Your physician recommends that you schedule a follow-up appointment in: 2 months.  If you need a refill on your cardiac medications before your next appointment, please call your pharmacy.   Thank you for choosing [facility]! Robyne Peers, RN [phone]

## 2018-04-01 NOTE — Telephone Encounter (Signed)
Pt states had episode yesterday at church around 11:30 AM. According to her sister she laid her head down and sister states she was unresponsive for a few seconds. Pt states felt very tired and went home after church and went to bed and slept for long time. Pt states had no other symptoms, no nausea or chest pain. Please advise

## 2018-04-01 NOTE — Telephone Encounter (Signed)
Patient states that she had an episode during church where she had put her head down and her sister said that she was unresponsive. The patient states she is unaware of how long she was unresponsive and did not feel any dizziness or lightheadedness, chest pain or shortness of breath. She stated that she felt fine after words. She has agreed to an office visit today.

## 2018-04-01 NOTE — Progress Notes (Signed)
Cardiology Office Note:    Date:  04/01/2018   ID:  Elizabeth Olsen, DOB Nov 03, 1940, MRN [id number]  PCP:  [name], MD  Cardiologist:  [name], MD    Referring MD: [name], MD   Chief Complaint  Patient presents with  . Unresponsive at [facility]  I passed out at church  History of Present Illness:    Elizabeth Olsen is a 77 y.o. female with diabetes cardiomyopathy ejection fraction 45 to 50% ischemia work-up negative recent surgery for gallbladder which was successful without any complications apparently yesterday she was at church she started feeling poorly sweating a little dizzy she checked her sugar was 150 and then eventually it looks like she passed out however she was sitting on the bench she did not fall down her sister was next to her and she said she was drooling.  The entire episode lasted for few minutes after that people pray for her and she was fine she went to eat was good.  Never had episodes like this before.  Did not have any palpitations no chest pain.  There is no confusion after.  Past Medical History:  Diagnosis Date  . Anemia   . Blood transfusion   . Blood transfusion without reported diagnosis    with breast surgery  . Breast cancer ([place]) 15 years ago   left   . CHF (congestive heart failure) ([place])   . 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 Diabetes mellitus    dx 1998.  was told prior to getting chemo that her bld sugar rose.  She thought it would go back  . Dyspnea    d/t anemia  . GERD (gastroesophageal reflux disease)   . Heart murmur    as child  . Hernia, incisional   . Hyperlipidemia   . Hypertension   . Hypothyroidism   . Vitamin D deficiency     Past Surgical History:  Procedure Laterality Date  . ABDOMINAL HYSTERECTOMY    . APPENDECTOMY    . BREAST SURGERY    . CHOLECYSTECTOMY  01/22/2018   LAPROSCOPIC   . CHOLECYSTECTOMY N/A 01/22/2018   Procedure: LAPAROSCOPIC CHOLECYSTECTOMY WITH INTRAOPERATIVE CHOLANGIOGRAM;   Surgeon: Donnie Mesa, MD;  Location: [place];  Service: General;  Laterality: N/A;  . COLONOSCOPY  2013   due next 03-2017  . EYE SURGERY     bil cataracts  . IUD REMOVAL     with appendectomy  . MASTECTOMY, MODIFIED RADICAL W/RECONSTRUCTION Left 15 years ago   10 nodes out  . TUMOR EXCISION Left    x 2, neck, head    Current Medications: Current Meds  Medication Sig  . atorvastatin (LIPITOR) 10 MG tablet Take 1 tablet (10 mg total) by mouth daily.  . calcium carbonate (OSCAL) 1500 (600 Ca) MG TABS tablet Take 600 mg by mouth daily with breakfast.   . carvedilol (COREG) 6.25 MG tablet Take 1 tablet (6.25 mg total) by mouth 2 (two) times daily.  . fenofibrate 160 MG tablet Take 160 mg by mouth daily.  . fish oil-omega-3 fatty acids 1000 MG capsule Take 1 g by mouth daily.   . furosemide (LASIX) 20 MG tablet Take 3 tablets (60 mg total) by mouth daily.  . Insulin Glargine (LANTUS SOLOSTAR) 100 UNIT/ML Solostar Pen Inject 7 Units into the skin 2 (two) times daily.  . insulin lispro (HUMALOG KWIKPEN) 100 UNIT/ML KiwkPen Inject 5-20 Units into the skin 3 (three) times daily. Sliding Scale  .  iron polysaccharides (NIFEREX) 150 MG capsule Take 150 mg by mouth 2 (two) times daily.   Marland Kitchen levothyroxine (SYNTHROID, LEVOTHROID) 112 MCG tablet Take 112 mcg by mouth See admin instructions. Take 1 tablet (112 mcg) by mouth on 6 days of the week; hold on Sundays.  Marland Kitchen losartan (COZAAR) 100 MG tablet Take 50 mg by mouth daily.   . Menthol-Methyl Salicylate (MUSCLE RUB) 10-15 % CREA Apply 1 application topically 4 (four) times daily as needed for muscle pain.   . Multiple Vitamins-Minerals (MULTIVITAMIN WITH MINERALS) tablet Take 1 tablet by mouth daily.  Glory Rosebush VERIO test strip USE TO TEST UP TO 10 TIMES D  . OVER THE COUNTER MEDICATION Take 5 mLs by mouth daily.   . ranitidine (ZANTAC) 300 MG capsule Take 300 mg by mouth 2 (two) times daily.   . vitamin C (ASCORBIC ACID) 500 MG tablet Take 500 mg by  mouth daily.  . Vitamin D, Ergocalciferol, (DRISDOL) 50000 UNITS CAPS Take 50,000 Units by mouth 2 (two) times a week. Sundays & Wednesdays.     Allergies:   Erythromycin; Penicillins; Nyquil [pseudoeph-doxylamine-dm-apap]; and Sulfa antibiotics   Social History   Socioeconomic History  . Marital status: Married    Spouse name: Not on file  . Number of children: 3  . Years of education: Not on file  . Highest education level: Not on file  Occupational History  . Occupation: homemaker  Social Needs  . Financial resource strain: Not on file  . Food insecurity:    Worry: Not on file    Inability: Not on file  . Transportation needs:    Medical: Not on file    Non-medical: Not on file  Tobacco Use  . Smoking status: Never Smoker  . Smokeless tobacco: Never Used  Substance and Sexual Activity  . Alcohol use: No  . Drug use: No  . Sexual activity: Not on file  Lifestyle  . Physical activity:    Days per week: Not on file    Minutes per session: Not on file  . Stress: Not on file  Relationships  . Social connections:    Talks on phone: Not on file    Gets together: Not on file    Attends religious service: Not on file    Active member of club or organization: Not on file    Attends meetings of clubs or organizations: Not on file    Relationship status: Not on file  Other Topics Concern  . Not on file  Social History Narrative  . Not on file     Family History: The patient's family history includes Congestive Heart Failure in her father; Diabetes in her other; Heart attack in her brother; Hypertension in her father, maternal grandfather, and maternal grandmother; Peripheral vascular disease in her father; Stomach cancer in her maternal grandmother. There is no history of Colon cancer, Esophageal cancer, Pancreatic cancer, or Rectal cancer. ROS:   Please see the history of present illness.    All 14 point review of systems negative except as described per history of  present illness  EKGs/Labs/Other Studies Reviewed:      Recent Labs: 10/13/2017: B Natriuretic Peptide 332.0 10/14/2017: TSH 0.980 10/29/2017: NT-Pro BNP 2,010 12/26/2017: ALT 14 01/18/2018: BUN 36; Potassium 4.0; Sodium 135 01/22/2018: Creatinine, Ser 1.26 03/20/2018: Hemoglobin 10.0; Platelet Count 384  Recent Lipid Panel    Component Value Date/Time   CHOL 184 07/23/2017 1538   TRIG 91 07/23/2017 1538   HDL 77  07/23/2017 1538   CHOLHDL 2.4 07/23/2017 1538   LDLCALC 89 07/23/2017 1538    Physical Exam:    VS:  BP (!) 174/74   Pulse 73   Ht 5' 2.5" (1.588 m)   Wt 150 lb (68 kg)   SpO2 98%   BMI 27.00 kg/m     Wt Readings from Last 3 Encounters:  04/01/18 150 lb (68 kg)  01/18/18 152 lb 1.6 oz (69 kg)  12/26/17 153 lb 14.4 oz (69.8 kg)     GEN:  Well nourished, well developed in no acute distress HEENT: Normal NECK: No JVD; No carotid bruits LYMPHATICS: No lymphadenopathy CARDIAC: RRR, no murmurs, no rubs, no gallops RESPIRATORY:  Clear to auscultation without rales, wheezing or rhonchi  ABDOMEN: Soft, non-tender, non-distended MUSCULOSKELETAL:  No edema; No deformity  SKIN: Warm and dry LOWER EXTREMITIES: no swelling NEUROLOGIC:  Alert and oriented x 3 PSYCHIATRIC:  Normal affect   ASSESSMENT:    1. Dilated cardiomyopathy ([place])   2. Near syncope   3. Mixed hyperlipidemia   4. Type 2 diabetes mellitus with other ophthalmic complication, with long-term current use of insulin (HCC)    PLAN:    In order of problems listed above:  1. Dilated cardiomyopathy ejection fraction 4550 on appropriate medications.  We will continue 2. Near syncope doubt very much that this is arrhythmia since there were long symptoms before to happen however I cannot completely excluded therefore we will put 7 days Holter monitor on her to make sure there is no significant bradycardia tachycardia. 3. Dyslipidemia we will continue with statin therapy. 4. Type 2 diabetes followed by  internal medicine team still poorly controlled because of lack of her compliance we had a long discussion with her as well as with her family about it she understand there is a problem trying to improve it.  Back in 2 months sooner if she has a problem   Medication Adjustments/Labs and Tests Ordered: Current medicines are reviewed at length with the patient today.  Concerns regarding medicines are outlined above.  No orders of the defined types were placed in this encounter.  Medication changes: No orders of the defined types were placed in this encounter.   Signed, Park Liter, MD, [facility] 04/01/2018 1:57 PM    [place]

## 2018-04-06 DIAGNOSIS — R300 Dysuria: Secondary | ICD-10-CM | POA: Diagnosis not present

## 2018-04-06 DIAGNOSIS — N390 Urinary tract infection, site not specified: Secondary | ICD-10-CM | POA: Diagnosis not present

## 2018-04-08 ENCOUNTER — Ambulatory Visit: Payer: Medicare Other

## 2018-04-08 DIAGNOSIS — R55 Syncope and collapse: Secondary | ICD-10-CM | POA: Diagnosis not present

## 2018-04-13 ENCOUNTER — Emergency Department (HOSPITAL_COMMUNITY)
Admission: EM | Admit: 2018-04-13 | Discharge: 2018-04-13 | Disposition: A | Discharge status: Home / Self Care | Payer: Medicare Other | Attending: Emergency Medicine | Admitting: Emergency Medicine

## 2018-04-13 ENCOUNTER — Emergency Department (HOSPITAL_COMMUNITY): Payer: Medicare Other

## 2018-04-13 ENCOUNTER — Encounter (HOSPITAL_COMMUNITY): Payer: Self-pay | Admitting: *Deleted

## 2018-04-13 DIAGNOSIS — E119 Type 2 diabetes mellitus without complications: Secondary | ICD-10-CM | POA: Diagnosis not present

## 2018-04-13 DIAGNOSIS — E039 Hypothyroidism, unspecified: Secondary | ICD-10-CM | POA: Insufficient documentation

## 2018-04-13 DIAGNOSIS — Z79899 Other long term (current) drug therapy: Secondary | ICD-10-CM | POA: Diagnosis not present

## 2018-04-13 DIAGNOSIS — I5020 Unspecified systolic (congestive) heart failure: Secondary | ICD-10-CM | POA: Diagnosis not present

## 2018-04-13 DIAGNOSIS — R0789 Other chest pain: Secondary | ICD-10-CM | POA: Diagnosis not present

## 2018-04-13 DIAGNOSIS — R072 Precordial pain: Secondary | ICD-10-CM | POA: Diagnosis not present

## 2018-04-13 DIAGNOSIS — Z794 Long term (current) use of insulin: Secondary | ICD-10-CM | POA: Insufficient documentation

## 2018-04-13 DIAGNOSIS — Z853 Personal history of malignant neoplasm of breast: Secondary | ICD-10-CM | POA: Insufficient documentation

## 2018-04-13 DIAGNOSIS — I110 Hypertensive heart disease with heart failure: Secondary | ICD-10-CM | POA: Insufficient documentation

## 2018-04-13 DIAGNOSIS — R079 Chest pain, unspecified: Secondary | ICD-10-CM | POA: Diagnosis not present

## 2018-04-13 LAB — I-STAT CHEM 8, ED
BUN: 26 mg/dL — ABNORMAL HIGH (ref 6–20)
CHLORIDE: 99 mmol/L — AB (ref 101–111)
Calcium, Ion: 1.23 mmol/L (ref 1.15–1.40)
Creatinine, Ser: 1.3 mg/dL — ABNORMAL HIGH (ref 0.44–1.00)
Glucose, Bld: 228 mg/dL — ABNORMAL HIGH (ref 65–99)
HCT: 33 % — ABNORMAL LOW (ref 36.0–46.0)
HEMOGLOBIN: 11.2 g/dL — AB (ref 12.0–15.0)
POTASSIUM: 4.4 mmol/L (ref 3.5–5.1)
SODIUM: 139 mmol/L (ref 135–145)
TCO2: 28 mmol/L (ref 22–32)

## 2018-04-13 LAB — BASIC METABOLIC PANEL
Anion gap: 7 (ref 5–15)
BUN: 31 mg/dL — AB (ref 6–20)
CO2: 32 mmol/L (ref 22–32)
CREATININE: 1.36 mg/dL — AB (ref 0.44–1.00)
Calcium: 9.8 mg/dL (ref 8.9–10.3)
Chloride: 102 mmol/L (ref 101–111)
GFR calc Af Amer: 43 mL/min — ABNORMAL LOW (ref >60)
GFR, EST NON AFRICAN AMERICAN: 37 mL/min — AB (ref >60)
Glucose, Bld: 141 mg/dL — ABNORMAL HIGH (ref 65–99)
POTASSIUM: 5.5 mmol/L — AB (ref 3.5–5.1)
SODIUM: 141 mmol/L (ref 135–145)

## 2018-04-13 LAB — I-STAT TROPONIN, ED
TROPONIN I, POC: 0.01 ng/mL (ref 0.00–0.08)
Troponin i, poc: 0 ng/mL (ref 0.00–0.08)

## 2018-04-13 LAB — CBC
HEMATOCRIT: 34 % — AB (ref 36.0–46.0)
Hemoglobin: 10.7 g/dL — ABNORMAL LOW (ref 12.0–15.0)
MCH: 28.8 pg (ref 26.0–34.0)
MCHC: 31.5 g/dL (ref 30.0–36.0)
MCV: 91.4 fL (ref 78.0–100.0)
PLATELETS: 354 10*3/uL (ref 150–400)
RBC: 3.72 MIL/uL — ABNORMAL LOW (ref 3.87–5.11)
RDW: 14.6 % (ref 11.5–15.5)
WBC: 6.9 10*3/uL (ref 4.0–10.5)

## 2018-04-13 LAB — TROPONIN I: Troponin I: <0.03 ng/mL (ref <0.03)

## 2018-04-13 MED ORDER — SODIUM CHLORIDE 0.9 % IV BOLUS: 500.0000 mL | Freq: Once | INTRAVENOUS | Status: DC

## 2018-04-13 MED ORDER — SODIUM CHLORIDE 0.9 % IV BOLUS
1000.0000 mL | Freq: Once | INTRAVENOUS | Status: AC
  Administered 2018-04-13: 1000 mL via INTRAVENOUS

## 2018-04-13 NOTE — ED Provider Notes (Signed)
[place] DEPT Provider Note   CSN: [id number] Arrival date & time: 04/13/18  1548     History   Chief Complaint Chief Complaint  Patient presents with  . Chest Pain  . Fatigue    HPI ADLER ALTON is a 77 y.o. female.  The history is provided by the patient. No language interpreter was used.  Chest Pain   This is a new problem. The current episode started 3 to 5 hours ago. The problem occurs constantly. The problem has been resolved. Associated with: anxiety. The pain is present in the substernal region. The pain is mild. The quality of the pain is described as brief. The pain radiates to the upper back. The symptoms are aggravated by certain positions. She has tried nothing for the symptoms.  Pt became upset because she was told her nephew was dead.  Pt reports nephew is not dead but she became very upset. Pt is currently on a holter monitor because she had a syncopal episode a week ago.    Past Medical History:  Diagnosis Date  . Anemia   . Blood transfusion   . Blood transfusion without reported diagnosis    with breast surgery  . Breast cancer ([place]) 15 years ago   left   . CHF (congestive heart failure) ([place])   . 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 Diabetes mellitus    dx 1998.  was told prior to getting chemo that her bld sugar rose.  She thought it would go back  . Dyspnea    d/t anemia  . GERD (gastroesophageal reflux disease)   . Heart murmur    as child  . Hernia, incisional   . Hyperlipidemia   . Hypertension   . Hypothyroidism   . Vitamin D deficiency     Patient Active Problem List   Diagnosis Date Noted  . Near syncope 04/01/2018  . 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 Anemia of chronic disease 12/10/2017  . Refractory anemia, unspecified ([place]) 12/10/2017  . Anemia 11/07/2017  . Acute systolic heart failure ([place]) 10/13/2017  . Acute respiratory failure with hypoxia ([place]) 10/13/2017  . Hypothyroid  10/13/2017  . Anemia associated with diabetes mellitus ([place]) 10/13/2017  . Diabetes mellitus ([place]) 07/23/2017  . Hyperlipidemia 07/23/2017  . Iron deficiency 07/23/2017  . Cardiomyopathy ([place]) 07/23/2017    Past Surgical History:  Procedure Laterality Date  . ABDOMINAL HYSTERECTOMY    . APPENDECTOMY    . BREAST SURGERY    . CHOLECYSTECTOMY  01/22/2018   LAPROSCOPIC   . CHOLECYSTECTOMY N/A 01/22/2018   Procedure: LAPAROSCOPIC CHOLECYSTECTOMY WITH INTRAOPERATIVE CHOLANGIOGRAM;  Surgeon: Donnie Mesa, MD;  Location: [place];  Service: General;  Laterality: N/A;  . COLONOSCOPY  2013   due next 03-2017  . EYE SURGERY     bil cataracts  . IUD REMOVAL     with appendectomy  . MASTECTOMY, MODIFIED RADICAL W/RECONSTRUCTION Left 15 years ago   10 nodes out  . TUMOR EXCISION Left    x 2, neck, head     OB History   None      Home Medications    Prior to Admission medications   Medication Sig Start Date End Date Taking? Authorizing Provider  atorvastatin (LIPITOR) 10 MG tablet Take 1 tablet (10 mg total) by mouth daily. 07/25/17   Park Liter, MD  calcium carbonate (OSCAL) 1500 (600 Ca) MG TABS tablet Take 600 mg by mouth daily with breakfast.  [provider]  carvedilol (COREG) 6.25 MG tablet Take 1 tablet (6.25 mg total) by mouth 2 (two) times daily. 08/10/17 01/11/19  Park Liter, MD  fenofibrate 160 MG tablet Take 160 mg by mouth daily.    [provider]  fish oil-omega-3 fatty acids 1000 MG capsule Take 1 g by mouth daily.     [provider]  furosemide (LASIX) 20 MG tablet Take 3 tablets (60 mg total) by mouth daily. 12/25/17 04/02/19  Park Liter, MD  Insulin Glargine (LANTUS SOLOSTAR) 100 UNIT/ML Solostar Pen Inject 7 Units into the skin 2 (two) times daily.    [provider]  insulin lispro (HUMALOG KWIKPEN) 100 UNIT/ML KiwkPen Inject 5-20 Units into the skin 3 (three) times daily. Sliding Scale    [provider]  iron polysaccharides (NIFEREX) 150 MG capsule Take 150 mg by mouth 2 (two) times daily.     [provider]  levothyroxine (SYNTHROID, LEVOTHROID) 112 MCG tablet Take 112 mcg by mouth See admin instructions. Take 1 tablet (112 mcg) by mouth on 6 days of the week; hold on Sundays.    [provider]  losartan (COZAAR) 100 MG tablet Take 50 mg by mouth daily.     [provider]  Menthol-Methyl Salicylate (MUSCLE RUB) 10-15 % CREA Apply 1 application topically 4 (four) times daily as needed for muscle pain.     [provider]  Multiple Vitamins-Minerals (MULTIVITAMIN WITH MINERALS) tablet Take 1 tablet by mouth daily.    [provider]  ONETOUCH VERIO test strip USE TO TEST UP TO 10 TIMES D 08/24/17   [provider]  OVER THE COUNTER MEDICATION Take 5 mLs by mouth daily.     [provider]  ranitidine (ZANTAC) 300 MG capsule Take 300 mg by mouth 2 (two) times daily.     [provider]  vitamin C (ASCORBIC ACID) 500 MG tablet Take 500 mg by mouth daily.    [provider]  Vitamin D, Ergocalciferol, (DRISDOL) 50000 UNITS CAPS Take 50,000 Units by mouth 2 (two) times a week. Sundays & Wednesdays. 02/20/12   [provider]    Family History Family History  Problem Relation Age of Onset  . Diabetes Other        both sides of family  . Hypertension Father   . Congestive Heart Failure Father   . Peripheral vascular disease Father   . Hypertension Maternal Grandfather   . Hypertension Maternal Grandmother   . Stomach cancer Maternal Grandmother        GGM  . Heart attack Brother   . Colon cancer Neg Hx   . Esophageal cancer Neg Hx   . Pancreatic cancer Neg Hx   . Rectal cancer Neg Hx     Social History Social History   Tobacco Use  . Smoking status: Never Smoker  . Smokeless tobacco: Never Used  Substance Use Topics  . Alcohol use: No  . Drug use: No     Allergies     Erythromycin; Penicillins; Nyquil [pseudoeph-doxylamine-dm-apap]; and Sulfa antibiotics   Review of Systems Review of Systems  Cardiovascular: Positive for chest pain.  All other systems reviewed and are negative.    Physical Exam Updated Vital Signs BP (!) 190/75 (BP Location: Right Arm)   Pulse 73   Temp 97.9 F (36.6 C) (Oral)   Resp 12   SpO2 100%   Physical Exam  Constitutional: She is oriented to person,  place, and time. She appears well-developed and well-nourished.  HENT:  Head: Normocephalic.  Eyes: EOM are normal.  Neck: Normal range of motion.  Cardiovascular: Normal rate, regular rhythm and normal pulses.  Pulmonary/Chest: Effort normal and breath sounds normal.  Abdominal: She exhibits no distension.  Musculoskeletal: Normal range of motion.       Right lower leg: Normal.       Left lower leg: Normal.  Neurological: She is alert and oriented to person, place, and time.  Skin: Skin is warm.  Psychiatric: She has a normal mood and affect.  Nursing note and vitals reviewed.    ED Treatments / Results  Labs (all labs ordered are listed, but only abnormal results are displayed) Labs Reviewed  BASIC METABOLIC PANEL - Abnormal; Notable for the following components:      Result Value   Potassium 5.5 (*)    Glucose, Bld 141 (*)    BUN 31 (*)    Creatinine, Ser 1.36 (*)    GFR calc non Af Amer 37 (*)    GFR calc Af Amer 43 (*)    All other components within normal limits  CBC - Abnormal; Notable for the following components:   RBC 3.72 (*)    Hemoglobin 10.7 (*)    HCT 34.0 (*)    All other components within normal limits  TROPONIN I  I-STAT TROPONIN, ED  I-STAT TROPONIN, ED    EKG EKG Interpretation  Date/Time:  Saturday April 13 2018 15:57:46 EDT Ventricular Rate:  75 PR Interval:  168 QRS Duration: 92 QT Interval:  406 QTC Calculation: 453 R Axis:   27 Text Interpretation:  Normal sinus rhythm Normal ECG since last tracing no significant  change Confirmed by Daleen Bo [phone]) on 04/13/2018 8:05:21 PM   Radiology Dg Chest 2 View  Result Date: 04/13/2018 CLINICAL DATA:  Chest pain and fatigue since moving heavy objects today. EXAM: CHEST - 2 VIEW COMPARISON:  Chest x-ray dated 10/13/2017. FINDINGS: Stable mild cardiomegaly. Aortic atherosclerosis. Monitor overlying the LEFT hilum. Lungs are clear. No pleural effusion or pneumothorax seen. No acute or suspicious osseous finding. Mild degenerative spurring throughout the slightly kyphotic thoracic spine. IMPRESSION: No active cardiopulmonary disease. No evidence of pneumonia or pulmonary edema. Electronically Signed   By: Franki Cabot M.D.   On: 04/13/2018 17:28    Procedures Procedures (including critical care time)  Medications Ordered in ED Medications  sodium chloride 0.9 % bolus 1,000 mL (1,000 mLs Intravenous New Bag/Given 04/13/18 2042)     Initial Impression / Assessment and Plan / ED Course  I have reviewed the triage vital signs and the nursing notes.  Pertinent labs & imaging results that were available during my care of the patient were reviewed by me and considered in my medical decision making (see chart for details).    MDM Dr. Eulis Foster in to see and examine.  EKG is normal chest x-ray is normal.  She is currently pain-free.  Patient reports her pain was limited to the time that she was upset.  Final Clinical Impressions(s) / ED Diagnoses   Final diagnoses:  Atypical chest pain    ED Discharge Orders    None    Patient advised to follow-up with her primary care physician follow-up with cardiologist as previously directed.   Fransico Meadow, PA-C 04/13/18 2305    Daleen Bo, MD 04/14/18 [phone]

## 2018-04-13 NOTE — Discharge Instructions (Signed)
See your Physician for recheck next week.  Return if any problems.   °

## 2018-04-13 NOTE — ED Triage Notes (Signed)
Pt complains of chest pain and fatigue since moving heavy objects today. Pt states chest pain subsided on the way to the ED. Pt was recently placed on a Holter monitor d/t syncopal episode 6 days ago.

## 2018-04-13 NOTE — ED Provider Notes (Signed)
  Face-to-face evaluation   History: Patient is here for evaluation of chest discomfort which started after she learned that her nephew had died.  Later she learned that somebody was moving her about this.  She had a heaviness in her lower chest, but resolved spontaneously after about 2 hours.  During this time she was distraught about his death.  She was recently evaluated by cardiologist and had a event monitor placed, which has not yet been removed or recorded.  She did not have any other symptoms associated with chest discomfort.  Physical exam: Elderly, alert, cooperative.  Heart, regular rate and rhythm without murmur lungs clear anteriorly.  Chest nontender to palpation.  MDM-noncardiac chest pain associated with a traumatic event.  Evaluation ED is reassuring.  EKG is normal.  Troponin x2 normal.  Labs with mild renal insufficiency and elevated potassium, blood sent for recheck.   EKG Interpretation  Date/Time:  Saturday April 13 2018 15:57:46 EDT Ventricular Rate:  75 PR Interval:  168 QRS Duration: 92 QT Interval:  406 QTC Calculation: 453 R Axis:   27 Text Interpretation:  Normal sinus rhythm Normal ECG since last tracing no significant change Confirmed by Daleen Bo [phone]) on 04/13/2018 8:05:21 PM      Repeat K+, 4.4.  Medical screening examination/treatment/procedure(s) were conducted as a shared visit with non-physician practitioner(s) and myself.  I personally evaluated the patient during the encounter    Daleen Bo, MD 04/14/18 [phone]

## 2018-04-17 ENCOUNTER — Inpatient Hospital Stay: Payer: Medicare Other | Attending: Hematology

## 2018-04-17 ENCOUNTER — Inpatient Hospital Stay: Payer: Medicare Other

## 2018-04-17 VITALS — BP 157/74 | HR 81 | Temp 97.6°F | Resp 16

## 2018-04-17 DIAGNOSIS — D638 Anemia in other chronic diseases classified elsewhere: Secondary | ICD-10-CM | POA: Insufficient documentation

## 2018-04-17 DIAGNOSIS — N183 Chronic kidney disease, stage 3 (moderate): Secondary | ICD-10-CM | POA: Diagnosis not present

## 2018-04-17 LAB — CBC WITH DIFFERENTIAL (CANCER CENTER ONLY)
BASOS ABS: 0 10*3/uL (ref 0.0–0.1)
Basophils Relative: 1 %
EOS ABS: 0.1 10*3/uL (ref 0.0–0.5)
Eosinophils Relative: 2 %
HCT: 31.7 % — ABNORMAL LOW (ref 34.8–46.6)
HEMOGLOBIN: 10.5 g/dL — AB (ref 11.6–15.9)
LYMPHS ABS: 1.5 10*3/uL (ref 0.9–3.3)
LYMPHS PCT: 25 %
MCH: 29.5 pg (ref 25.1–34.0)
MCHC: 33.2 g/dL (ref 31.5–36.0)
MCV: 88.7 fL (ref 79.5–101.0)
Monocytes Absolute: 0.7 10*3/uL (ref 0.1–0.9)
Monocytes Relative: 12 %
NEUTROS PCT: 60 %
Neutro Abs: 3.7 10*3/uL (ref 1.5–6.5)
Platelet Count: 337 10*3/uL (ref 145–400)
RBC: 3.58 MIL/uL — AB (ref 3.70–5.45)
RDW: 15 % — ABNORMAL HIGH (ref 11.2–14.5)
WBC: 6.1 10*3/uL (ref 3.9–10.3)

## 2018-04-17 MED ORDER — DARBEPOETIN ALFA 100 MCG/0.5ML IJ SOSY
100.0000 ug | PREFILLED_SYRINGE | Freq: Once | INTRAMUSCULAR | Status: AC
  Administered 2018-04-17: 100 ug via SUBCUTANEOUS
  Filled 2018-04-17: qty 0.5

## 2018-04-17 NOTE — Patient Instructions (Signed)

## 2018-04-17 NOTE — Progress Notes (Signed)
Spoke with Dr. Burr Medico regarding patient's BP 187/90, Hbg 10.5, per Dr. Burr Medico recheck BP in 10 minutes if comes down to 160/90 give Aranesp if it does not do not give. Info given to [employer]

## 2018-05-06 NOTE — Progress Notes (Signed)
[facility]  [phone]) [phone] [phone]  Clinic Follow Up Note   Patient Care Team: [name], MD as PCP - General (Family Medicine) Reynold Bowen, MD as Consulting Physician (Endocrinology) 05/15/2018  CHIEF COMPLAINTS:  F/u anemia of chronic disease   HISTORY OF PRESENTING ILLNESS (11/07/2017):  Elizabeth Olsen 77 y.o. female is here because of  anemia. She was referred by Dr. Alyson Ingles at [facility] for her worsening microcytic hypochromic anemia despite iron supplementation. Her Hgb was 9, iron sat 18% and her iron level was 58 per most recent labs on 10/09/17. Pt states she has been diagnosed as anemic for over a year. Pt reports increasing fatigue over the past year and states doing errands and chores is sometimes difficult. Pt has some leg swelling that she treats with Lasix. Pt denies night sweats.   Pt has a PMHx of CHF, DM, Breast CA. She is treated by a cardiologist, Dr. Agustin Cree and her DM is monitored by her PCP, Dr. Alyson Ingles. Pt's CA was treated by Dr. Benay Spice in 1998. Pt underwent a left breast mastectomy and chemotherapy. She does not currently take any AEOT. She does get yearly screening mammograms. No other Fhx of CA. No tobacco use.   On review of systems, pt denies fever, chills, rash, mouth sores, decreased appetite, urinary complaints. Denies pain. Pt denies abdominal pain, nausea, vomiting. Pertinent positives are listed and detailed within the above HPI.   CURRENT THERAPY: Aranesp injection 129mg monthly started on 01/01/18  INTERVAL HISTORY:  Elizabeth FELKERis here for follow up. She is here with alone. She feels well. She feels tired in the morning, but feels better during the day. She states that she drinks a vitamin drink that helps her. No pain or other side effect from the injection. She reports LL edema sometimes, which she contributes to heart failure and is taking Lasix for that. She reports an episode of spacing out and  not responding one month ago. She didn't have headaches or memory loss after that. She is moving to a new place next week and she is very busy.    MEDICAL HISTORY:  Past Medical History:  Diagnosis Date  . Anemia   . Blood transfusion   . Blood transfusion without reported diagnosis    with breast surgery  [place]   [place]   . [place]   . 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 Diabetes mellitus    dx 1998.  was told prior to getting chemo that her bld sugar rose.  She thought it would go back  . Dyspnea    d/t anemia  . GERD (gastroesophageal reflux disease)   . Heart murmur    as child  . Hernia, incisional   . Hyperlipidemia   . Hypertension   . Hypothyroidism   . Vitamin D deficiency     SURGICAL HISTORY: Past Surgical History:  Procedure Laterality Date  . ABDOMINAL HYSTERECTOMY    . APPENDECTOMY    . BREAST SURGERY    . CHOLECYSTECTOMY  01/22/2018   LAPROSCOPIC   . CHOLECYSTECTOMY N/A 01/22/2018   Procedure: LAPAROSCOPIC CHOLECYSTECTOMY WITH INTRAOPERATIVE CHOLANGIOGRAM;  Surgeon: TDonnie Mesa MD;  [facility]  Service: General;  Laterality: N/A;  . COLONOSCOPY  2013   due next 03-2017  . EYE SURGERY     bil cataracts  . IUD REMOVAL     with appendectomy  .  MASTECTOMY, MODIFIED RADICAL W/RECONSTRUCTION Left 15 years ago   10 nodes out  . TUMOR EXCISION Left    x 2, neck, head    SOCIAL HISTORY: Social History   Socioeconomic History  . Marital status: Married    Spouse name: Not on file  . Number of children: 3  . Years of education: Not on file  . Highest education level: Not on file  Occupational History  . Occupation: homemaker  Social Needs  . Financial resource strain: Not on file  . Food insecurity:    Worry: Not on file    Inability: Not on file  . Transportation needs:    Medical: Not on file    Non-medical: Not on file  Tobacco Use  . Smoking status: Never Smoker  .  Smokeless tobacco: Never Used  Substance and Sexual Activity  . Alcohol use: No  . Drug use: No  . Sexual activity: Not on file  Lifestyle  . Physical activity:    Days per week: Not on file    Minutes per session: Not on file  . Stress: Not on file  Relationships  . Social connections:    Talks on phone: Not on file    Gets together: Not on file    Attends religious service: Not on file    Active member of club or organization: Not on file    Attends meetings of clubs or organizations: Not on file    Relationship status: Not on file  . Intimate partner violence:    Fear of current or ex partner: Not on file    Emotionally abused: Not on file    Physically abused: Not on file    Forced sexual activity: Not on file  Other Topics Concern  . Not on file  Social History Narrative  . Not on file    FAMILY HISTORY: Family History  Problem Relation Age of Onset  . Diabetes Other        both sides of family  . Hypertension Father   . Congestive Heart Failure Father   . Peripheral vascular disease Father   . Hypertension Maternal Grandfather   . Hypertension Maternal Grandmother   . Stomach cancer Maternal Grandmother        GGM  . Heart attack Brother   . Colon cancer Neg Hx   . Esophageal cancer Neg Hx   . Pancreatic cancer Neg Hx   . Rectal cancer Neg Hx     ALLERGIES:  is allergic to erythromycin; penicillins; nyquil [pseudoeph-doxylamine-dm-apap]; and sulfa antibiotics.  MEDICATIONS:  Current Outpatient Medications  Medication Sig Dispense Refill  . atorvastatin (LIPITOR) 10 MG tablet Take 1 tablet (10 mg total) by mouth daily. 30 tablet 11  . calcium carbonate (OSCAL) 1500 (600 Ca) MG TABS tablet Take 600 mg by mouth daily with breakfast.     . carvedilol (COREG) 6.25 MG tablet Take 1 tablet (6.25 mg total) by mouth 2 (two) times daily. 60 tablet 11  . fenofibrate 160 MG tablet Take 160 mg by mouth daily.    . fish oil-omega-3 fatty acids 1000 MG capsule Take 1 g  by mouth daily.     . furosemide (LASIX) 20 MG tablet Take 3 tablets (60 mg total) by mouth daily. 270 tablet 3  . Insulin Glargine (LANTUS SOLOSTAR) 100 UNIT/ML Solostar Pen Inject 7 Units into the skin 2 (two) times daily.    . insulin lispro (HUMALOG KWIKPEN) 100 UNIT/ML KiwkPen Inject 5-20 Units into  the skin 3 (three) times daily. Sliding Scale    . iron polysaccharides (NIFEREX) 150 MG capsule Take 150 mg by mouth 2 (two) times daily.     Marland Kitchen levothyroxine (SYNTHROID, LEVOTHROID) 112 MCG tablet Take 112 mcg by mouth See admin instructions. Take 1 tablet (112 mcg) by mouth on 6 days of the week; hold on Sundays.    Marland Kitchen losartan (COZAAR) 100 MG tablet Take 50 mg by mouth daily.     . Menthol-Methyl Salicylate (MUSCLE RUB) 10-15 % CREA Apply 1 application topically 4 (four) times daily as needed for muscle pain.     . Multiple Vitamins-Minerals (MULTIVITAMIN WITH MINERALS) tablet Take 1 tablet by mouth daily.    Glory Rosebush VERIO test strip USE TO TEST UP TO 10 TIMES D  12  . OVER THE COUNTER MEDICATION Take 5 mLs by mouth daily.     . ranitidine (ZANTAC) 300 MG capsule Take 300 mg by mouth 2 (two) times daily.     . vitamin C (ASCORBIC ACID) 500 MG tablet Take 500 mg by mouth daily.    . Vitamin D, Ergocalciferol, (DRISDOL) 50000 UNITS CAPS Take 50,000 Units by mouth 2 (two) times a week. Sundays & Wednesdays.     No current facility-administered medications for this visit.    Facility-Administered Medications Ordered in Other Visits  Medication Dose Route Frequency Provider Last Rate Last Dose  . Darbepoetin Alfa (ARANESP) injection 100 mcg  100 mcg Subcutaneous Once Truitt Merle, MD        REVIEW OF SYSTEMS:   Constitutional: Denies fevers, chills or abnormal night sweats (+) fatigue in the morning Eyes: Denies blurriness of vision, double vision or watery eyes Ears, nose, mouth, throat, and face: Denies mucositis or sore throat Respiratory: Denies cough, dyspnea or wheezes Cardiovascular:  Denies palpitation, chest discomfort (+) lower extremity swelling Gastrointestinal:  Denies nausea, heartburn or change in bowel habits Skin: Denies abnormal skin rashes Lymphatics: Denies new lymphadenopathy or easy bruising Neurological:Denies numbness, tingling or new weaknesses Behavioral/Psych: Mood is stable, no new changes  All other systems were reviewed with the patient and are negative.  PHYSICAL EXAMINATION: ECOG PERFORMANCE STATUS: 1 - Symptomatic but completely ambulatory   Today's Vitals   05/15/18 1044 05/15/18 1050 05/15/18 1114  BP: (!) 169/59  (!) 150/54  Pulse: 77  75  Resp: 20    Temp: 98.3 F (36.8 C)    TempSrc: Oral    SpO2: 98%    Weight: 148 lb 8 oz (67.4 kg)    Height: 5' 2.5" (1.588 m)    PainSc:  0-No pain     GENERAL:alert, no distress and comfortable SKIN: skin color, texture, turgor are normal, no rashes or significant lesions EYES: normal, conjunctiva are pink and non-injected, sclera clear OROPHARYNX:no exudate, no erythema and lips, buccal mucosa, and tongue normal  NECK: supple, thyroid normal size, non-tender, without nodularity LYMPH:  no palpable lymphadenopathy in the cervical, axillary or inguinal LUNGS: clear to auscultation and percussion with normal breathing effort HEART: regular rate & rhythm and no murmurs and no lower extremity edema ABDOMEN:abdomen soft, non-tender and normal bowel sounds (+) she has 4 laparoscopic incisions that are healed well  Musculoskeletal:no cyanosis of digits and no clubbing  PSYCH: alert & oriented x 3 with fluent speech NEURO: no focal motor/sensory deficits  LABORATORY DATA:  I have reviewed the data as listed CBC Latest Ref Rng & Units 05/15/2018 04/17/2018 04/13/2018  WBC 3.9 - 10.3 K/uL 7.0 6.1 -  Hemoglobin 11.6 - 15.9 g/dL 10.2(L) 10.5(L) 11.2(L)  Hematocrit 34.8 - 46.6 % 30.9(L) 31.7(L) 33.0(L)  Platelets 145 - 400 K/uL 365 337 -    CMP Latest Ref Rng & Units 04/13/2018 04/13/2018 01/22/2018    Glucose 65 - 99 mg/dL 228(H) 141(H) -  BUN 6 - 20 mg/dL 26(H) 31(H) -  Creatinine 0.44 - 1.00 mg/dL 1.30(H) 1.36(H) 1.26(H)  Sodium 135 - 145 mmol/L 139 141 -  Potassium 3.5 - 5.1 mmol/L 4.4 5.5(H) -  Chloride 101 - 111 mmol/L 99(L) 102 -  CO2 22 - 32 mmol/L - 32 -  Calcium 8.9 - 10.3 mg/dL - 9.8 -  Total Protein 6.4 - 8.3 g/dL - - -  Total Bilirubin 0.2 - 1.2 mg/dL - - -  Alkaline Phos 40 - 150 U/L - - -  AST 5 - 34 U/L - - -  ALT 0 - 55 U/L - - -   Iron/TIBC/Ferritin/ %Sat    Component Value Date/Time   IRON 56 11/07/2017 1608   TIBC 344 11/07/2017 1608   FERRITIN 332 (H) 11/07/2017 1608   IRONPCTSAT 16 (L) 11/07/2017 1608   CBC    Component Value Date/Time   WBC 7.0 05/15/2018 0957   WBC 6.9 04/13/2018 1844   RBC 3.50 (L) 05/15/2018 0957   HGB 10.2 (L) 05/15/2018 0957   HGB 9.7 (L) 07/23/2017 1538   HGB 12.3 02/14/2007 0909   HCT 30.9 (L) 05/15/2018 0957   HCT 28.2 (L) 11/07/2017 1608   HCT 35.6 02/14/2007 0909   PLT 365 05/15/2018 0957   PLT 436 (H) 07/23/2017 1538   MCV 88.3 05/15/2018 0957   MCV 89 07/23/2017 1538   MCV 87.5 02/14/2007 0909   MCH 29.1 05/15/2018 0957   MCHC 33.0 05/15/2018 0957   RDW 13.6 05/15/2018 0957   RDW 13.8 07/23/2017 1538   RDW 13.1 02/14/2007 0909   LYMPHSABS 2.3 05/15/2018 0957   LYMPHSABS 2.4 02/14/2007 0909   MONOABS 0.9 05/15/2018 0957   MONOABS 0.7 02/14/2007 0909   EOSABS 0.2 05/15/2018 0957   EOSABS 0.2 02/14/2007 0909   BASOSABS 0.0 05/15/2018 0957   BASOSABS 0.0 02/14/2007 0909      PROCEDURE: Colonoscopy 08/09/17 FINDINGS - The perianal and digital rectal examinations were normal. - A 7 mm polyp was found in the descending colon. The polyp was sessile. The polyp was removed with a cold snare. Resection and retrieval were complete. - Internal hemorrhoids were found during retroflexion. The hemorrhoids were small and Grade I (internal hemorrhoids that do not prolapse). - The exam was otherwise without  abnormality on direct and retroflexion views.  PATHOLOGY  Diagnosis 11/21/17 Bone Marrow, Aspirate,Biopsy, and Clot - HYPERCELLULAR BONE MARROW WITH GRANULOCYTIC HYPERPLASIA. - SEVERAL LYMPHOID AGGREGATES PRESENT. - SEE COMMENT. PERIPHERAL BLOOD: - NORMOCYTIC-NORMOCHROMIC ANEMIA. Diagnosis Note The bone marrow is hypercellular for age with granulocytic hyperplasia. Significant dyspoiesis is not present in myeloid cell lines and hence the changes are likely secondary in nature. In this background, there are several interstitial lymphoid aggregates mostly composed of small lymphoid cells. Immunohistochemical stains and flow cytometric analysis failed to show any phenotypic abnormalities most consistent with a reactive lymphoid process. Correlation with cytogenetic studies is recommended. (BNS:gt, 12/04/17) Interpretation 11/21/17 Bone Marrow Flow Cytometry - NO MONOCLONAL B-CELL POPULATION OR ABNORMAL T-CELL PHENOTYPE IDENTIFIED.  Cytogenetic Analysis 11/21/17 Normal: Cytogenetic analysis revealed the presence of normal female chromosomes with no observable clonal chromosomal abnormalities.   RADIOGRAPHIC STUDIES: I have personally reviewed the radiological  images as listed and agreed with the findings in the report. No results found.  ASSESSMENT & PLAN:  Elizabeth Olsen is a 77 y.o. female with anemia, CHF,DM, and CKD.  1. Anemia of chronic diseases  -I previously reviewed her previous CBC and anemia workup, she had normal ferritin, iron study, X32 and folic acid in 3557, ret was low, normal MCV.  No lab evidence for hemolysis or multiple myeloma.  Her erythropoietin level was normal. -She does have chronic disease including uncontrolled diabetes, congestive heart failure, and mild CKD, which can cause anemia of chronic disease -Pt underwent a bone marrow biopsy in 11/21/17 with Mendel Ryder.  I previously discussed her bone marrow biopsy with pathologist Dr. Gari Crown, she does have hypercellular  bone marrow, with granulocytic hyperplasia, no evidence of dysplasia.  We feel her pulmonary change is likely secondary, no evidence of MDS or other primary marrow disease.  I think this is consistent with anemia of chronic disease.  -Since her Hgb is below 9 most of time, she has started monthly Aranasp injection, she is tolerating well. -Labs reviewed today, Hgb is 10.2, she responded well, I will goal of therapy is maintained her hemoglobin in 10-11 range. She will have injection today and every month, will continue at the current dose, and adjust as needed. -F/u in 4 months  2. CHF -Pt sees a cardiologist, Dr. Agustin Cree regularly. She takes 40 mg Lasix   3. DM - Follow up with PCP, Dr. Alyson Ingles for monitoring -I advised her that if her DM is well controlled, this will improve her other comorbidities.   4. Hyperkalemia  -Her K was 5.2  (12/26/17), she does not take a K supplement but she will check her multivitamin to see if it contains K, if so she will hold.  -I advised her on foods to avoid with high amount of K  5. Stage III CKD -Likely secondary to her diabetes.   -I previously advised her to drink plenty of fluids and to control her diabetes.   PLAN: -Aranesp injection today -Lab and injection every months x4 -F/u in 4 motnhs  All questions were answered. The patient knows to call the clinic with any problems, questions or concerns . I spent 15 counseling the patient face to face. The total time spent in the appointment was 20 and more than 50% was on counseling.  Dierdre Searles Dweik am acting as scribe for Dr. Truitt Merle.  I have reviewed the above documentation for accuracy and completeness, and I agree with the above.    Truitt Merle, MD 05/15/18

## 2018-05-07 DIAGNOSIS — E039 Hypothyroidism, unspecified: Secondary | ICD-10-CM | POA: Diagnosis not present

## 2018-05-07 DIAGNOSIS — E782 Mixed hyperlipidemia: Secondary | ICD-10-CM | POA: Diagnosis not present

## 2018-05-07 DIAGNOSIS — D509 Iron deficiency anemia, unspecified: Secondary | ICD-10-CM | POA: Diagnosis not present

## 2018-05-07 DIAGNOSIS — K219 Gastro-esophageal reflux disease without esophagitis: Secondary | ICD-10-CM | POA: Diagnosis not present

## 2018-05-07 DIAGNOSIS — I10 Essential (primary) hypertension: Secondary | ICD-10-CM | POA: Diagnosis not present

## 2018-05-07 DIAGNOSIS — Z1389 Encounter for screening for other disorder: Secondary | ICD-10-CM | POA: Diagnosis not present

## 2018-05-07 DIAGNOSIS — Z853 Personal history of malignant neoplasm of breast: Secondary | ICD-10-CM | POA: Diagnosis not present

## 2018-05-07 DIAGNOSIS — E559 Vitamin D deficiency, unspecified: Secondary | ICD-10-CM | POA: Diagnosis not present

## 2018-05-07 DIAGNOSIS — E10311 Type 1 diabetes mellitus with unspecified diabetic retinopathy with macular edema: Secondary | ICD-10-CM | POA: Diagnosis not present

## 2018-05-07 DIAGNOSIS — E2839 Other primary ovarian failure: Secondary | ICD-10-CM | POA: Diagnosis not present

## 2018-05-07 DIAGNOSIS — Z0000 Encounter for general adult medical examination without abnormal findings: Secondary | ICD-10-CM | POA: Diagnosis not present

## 2018-05-07 DIAGNOSIS — N3946 Mixed incontinence: Secondary | ICD-10-CM | POA: Diagnosis not present

## 2018-05-15 ENCOUNTER — Inpatient Hospital Stay: Payer: Medicare Other | Attending: Hematology

## 2018-05-15 ENCOUNTER — Inpatient Hospital Stay (HOSPITAL_BASED_OUTPATIENT_CLINIC_OR_DEPARTMENT_OTHER): Payer: Medicare Other | Admitting: Hematology

## 2018-05-15 ENCOUNTER — Inpatient Hospital Stay: Payer: Medicare Other

## 2018-05-15 VITALS — BP 150/54 | HR 75 | Temp 98.3°F | Resp 20 | Ht 62.5 in | Wt 148.5 lb

## 2018-05-15 DIAGNOSIS — E113312 Type 2 diabetes mellitus with moderate nonproliferative diabetic retinopathy with macular edema, left eye: Secondary | ICD-10-CM | POA: Diagnosis not present

## 2018-05-15 DIAGNOSIS — E875 Hyperkalemia: Secondary | ICD-10-CM | POA: Diagnosis not present

## 2018-05-15 DIAGNOSIS — D509 Iron deficiency anemia, unspecified: Secondary | ICD-10-CM | POA: Diagnosis not present

## 2018-05-15 DIAGNOSIS — D638 Anemia in other chronic diseases classified elsewhere: Secondary | ICD-10-CM | POA: Insufficient documentation

## 2018-05-15 DIAGNOSIS — E1122 Type 2 diabetes mellitus with diabetic chronic kidney disease: Secondary | ICD-10-CM | POA: Diagnosis not present

## 2018-05-15 DIAGNOSIS — N183 Chronic kidney disease, stage 3 (moderate): Secondary | ICD-10-CM | POA: Insufficient documentation

## 2018-05-15 DIAGNOSIS — I509 Heart failure, unspecified: Secondary | ICD-10-CM

## 2018-05-15 DIAGNOSIS — E113311 Type 2 diabetes mellitus with moderate nonproliferative diabetic retinopathy with macular edema, right eye: Secondary | ICD-10-CM | POA: Diagnosis not present

## 2018-05-15 DIAGNOSIS — E782 Mixed hyperlipidemia: Secondary | ICD-10-CM | POA: Diagnosis not present

## 2018-05-15 DIAGNOSIS — I10 Essential (primary) hypertension: Secondary | ICD-10-CM | POA: Diagnosis not present

## 2018-05-15 DIAGNOSIS — E559 Vitamin D deficiency, unspecified: Secondary | ICD-10-CM | POA: Diagnosis not present

## 2018-05-15 LAB — CBC WITH DIFFERENTIAL (CANCER CENTER ONLY)
Basophils Absolute: 0 10*3/uL (ref 0.0–0.1)
Basophils Relative: 0 %
EOS PCT: 3 %
Eosinophils Absolute: 0.2 10*3/uL (ref 0.0–0.5)
HEMATOCRIT: 30.9 % — AB (ref 34.8–46.6)
Hemoglobin: 10.2 g/dL — ABNORMAL LOW (ref 11.6–15.9)
LYMPHS PCT: 33 %
Lymphs Abs: 2.3 10*3/uL (ref 0.9–3.3)
MCH: 29.1 pg (ref 25.1–34.0)
MCHC: 33 g/dL (ref 31.5–36.0)
MCV: 88.3 fL (ref 79.5–101.0)
MONO ABS: 0.9 10*3/uL (ref 0.1–0.9)
MONOS PCT: 12 %
NEUTROS ABS: 3.6 10*3/uL (ref 1.5–6.5)
Neutrophils Relative %: 52 %
Platelet Count: 365 10*3/uL (ref 145–400)
RBC: 3.5 MIL/uL — ABNORMAL LOW (ref 3.70–5.45)
RDW: 13.6 % (ref 11.2–14.5)
WBC Count: 7 10*3/uL (ref 3.9–10.3)

## 2018-05-15 MED ORDER — DARBEPOETIN ALFA 100 MCG/0.5ML IJ SOSY
100.0000 ug | PREFILLED_SYRINGE | Freq: Once | INTRAMUSCULAR | Status: AC
  Administered 2018-05-15: 100 ug via SUBCUTANEOUS
  Filled 2018-05-15: qty 0.5

## 2018-05-16 ENCOUNTER — Telehealth: Payer: Self-pay | Admitting: Hematology

## 2018-05-16 NOTE — Telephone Encounter (Signed)
Mailed patient calendar of upcoming appts.  °

## 2018-05-22 DIAGNOSIS — E162 Hypoglycemia, unspecified: Secondary | ICD-10-CM | POA: Diagnosis not present

## 2018-05-22 DIAGNOSIS — R404 Transient alteration of awareness: Secondary | ICD-10-CM | POA: Diagnosis not present

## 2018-05-22 DIAGNOSIS — I10 Essential (primary) hypertension: Secondary | ICD-10-CM | POA: Diagnosis not present

## 2018-05-22 DIAGNOSIS — E161 Other hypoglycemia: Secondary | ICD-10-CM | POA: Diagnosis not present

## 2018-05-22 DIAGNOSIS — R4020 Unspecified coma: Secondary | ICD-10-CM | POA: Diagnosis not present

## 2018-06-12 ENCOUNTER — Inpatient Hospital Stay: Payer: Medicare Other

## 2018-06-12 ENCOUNTER — Inpatient Hospital Stay: Payer: Medicare Other | Attending: Hematology

## 2018-06-12 VITALS — BP 150/68 | HR 69 | Temp 98.0°F | Resp 18

## 2018-06-12 DIAGNOSIS — N183 Chronic kidney disease, stage 3 (moderate): Secondary | ICD-10-CM | POA: Diagnosis not present

## 2018-06-12 DIAGNOSIS — D638 Anemia in other chronic diseases classified elsewhere: Secondary | ICD-10-CM

## 2018-06-12 LAB — HEMOGLOBIN: Hemoglobin: 10.4 g/dL — ABNORMAL LOW (ref 11.6–15.9)

## 2018-06-12 MED ORDER — DARBEPOETIN ALFA 100 MCG/0.5ML IJ SOSY
100.0000 ug | PREFILLED_SYRINGE | Freq: Once | INTRAMUSCULAR | Status: AC
  Administered 2018-06-12: 100 ug via SUBCUTANEOUS
  Filled 2018-06-12: qty 0.5

## 2018-06-12 NOTE — Patient Instructions (Signed)

## 2018-06-18 DIAGNOSIS — Z1231 Encounter for screening mammogram for malignant neoplasm of breast: Secondary | ICD-10-CM | POA: Diagnosis not present

## 2018-06-18 DIAGNOSIS — N3281 Overactive bladder: Secondary | ICD-10-CM | POA: Diagnosis not present

## 2018-06-18 DIAGNOSIS — N393 Stress incontinence (female) (male): Secondary | ICD-10-CM | POA: Diagnosis not present

## 2018-06-18 DIAGNOSIS — Z01419 Encounter for gynecological examination (general) (routine) without abnormal findings: Secondary | ICD-10-CM | POA: Diagnosis not present

## 2018-06-18 DIAGNOSIS — R350 Frequency of micturition: Secondary | ICD-10-CM | POA: Diagnosis not present

## 2018-06-24 DIAGNOSIS — R42 Dizziness and giddiness: Secondary | ICD-10-CM

## 2018-06-24 HISTORY — DX: Dizziness and giddiness: R42

## 2018-07-02 DIAGNOSIS — D509 Iron deficiency anemia, unspecified: Secondary | ICD-10-CM | POA: Diagnosis not present

## 2018-07-10 ENCOUNTER — Inpatient Hospital Stay: Payer: Medicare Other

## 2018-07-10 ENCOUNTER — Inpatient Hospital Stay: Payer: Medicare Other | Attending: Hematology

## 2018-07-10 VITALS — BP 148/70 | HR 77

## 2018-07-10 DIAGNOSIS — D638 Anemia in other chronic diseases classified elsewhere: Secondary | ICD-10-CM

## 2018-07-10 DIAGNOSIS — N183 Chronic kidney disease, stage 3 (moderate): Secondary | ICD-10-CM | POA: Insufficient documentation

## 2018-07-10 LAB — HEMOGLOBIN: HEMOGLOBIN: 10.7 g/dL — AB (ref 11.6–15.9)

## 2018-07-10 MED ORDER — DARBEPOETIN ALFA 100 MCG/0.5ML IJ SOSY
100.0000 ug | PREFILLED_SYRINGE | Freq: Once | INTRAMUSCULAR | Status: AC
  Administered 2018-07-10: 100 ug via SUBCUTANEOUS
  Filled 2018-07-10: qty 0.5

## 2018-07-10 MED ORDER — DARBEPOETIN ALFA 200 MCG/0.4ML IJ SOSY
PREFILLED_SYRINGE | INTRAMUSCULAR | Status: AC
  Filled 2018-07-10: qty 0.4

## 2018-07-10 NOTE — Patient Instructions (Signed)

## 2018-07-14 DIAGNOSIS — Z23 Encounter for immunization: Secondary | ICD-10-CM | POA: Diagnosis not present

## 2018-07-29 ENCOUNTER — Ambulatory Visit
Admission: RE | Admit: 2018-07-29 | Discharge: 2018-07-29 | Disposition: A | Discharge status: Home / Self Care | Payer: Medicare Other | Source: Ambulatory Visit | Attending: Family Medicine | Admitting: Family Medicine

## 2018-07-29 ENCOUNTER — Other Ambulatory Visit: Payer: Self-pay | Admitting: Family Medicine

## 2018-07-29 DIAGNOSIS — R52 Pain, unspecified: Secondary | ICD-10-CM

## 2018-07-29 DIAGNOSIS — M542 Cervicalgia: Secondary | ICD-10-CM | POA: Diagnosis not present

## 2018-07-29 DIAGNOSIS — Z23 Encounter for immunization: Secondary | ICD-10-CM | POA: Diagnosis not present

## 2018-08-06 ENCOUNTER — Ambulatory Visit: Payer: Medicare Other | Admitting: Cardiology

## 2018-08-06 DIAGNOSIS — I10 Essential (primary) hypertension: Secondary | ICD-10-CM | POA: Diagnosis not present

## 2018-08-06 DIAGNOSIS — D126 Benign neoplasm of colon, unspecified: Secondary | ICD-10-CM | POA: Diagnosis not present

## 2018-08-06 DIAGNOSIS — Z6825 Body mass index (BMI) 25.0-25.9, adult: Secondary | ICD-10-CM | POA: Diagnosis not present

## 2018-08-06 DIAGNOSIS — H3520 Other non-diabetic proliferative retinopathy, unspecified eye: Secondary | ICD-10-CM | POA: Diagnosis not present

## 2018-08-06 DIAGNOSIS — E1149 Type 2 diabetes mellitus with other diabetic neurological complication: Secondary | ICD-10-CM | POA: Diagnosis not present

## 2018-08-06 DIAGNOSIS — I420 Dilated cardiomyopathy: Secondary | ICD-10-CM | POA: Diagnosis not present

## 2018-08-06 DIAGNOSIS — E559 Vitamin D deficiency, unspecified: Secondary | ICD-10-CM | POA: Diagnosis not present

## 2018-08-06 DIAGNOSIS — Z794 Long term (current) use of insulin: Secondary | ICD-10-CM | POA: Diagnosis not present

## 2018-08-06 DIAGNOSIS — N183 Chronic kidney disease, stage 3 (moderate): Secondary | ICD-10-CM | POA: Diagnosis not present

## 2018-08-06 DIAGNOSIS — E038 Other specified hypothyroidism: Secondary | ICD-10-CM | POA: Diagnosis not present

## 2018-08-06 DIAGNOSIS — E7849 Other hyperlipidemia: Secondary | ICD-10-CM | POA: Diagnosis not present

## 2018-08-06 DIAGNOSIS — E1142 Type 2 diabetes mellitus with diabetic polyneuropathy: Secondary | ICD-10-CM | POA: Diagnosis not present

## 2018-08-07 ENCOUNTER — Inpatient Hospital Stay: Payer: Medicare Other

## 2018-08-07 ENCOUNTER — Inpatient Hospital Stay: Payer: Medicare Other | Attending: Hematology

## 2018-08-07 VITALS — BP 109/61 | HR 78 | Temp 98.0°F | Resp 18

## 2018-08-07 DIAGNOSIS — D638 Anemia in other chronic diseases classified elsewhere: Secondary | ICD-10-CM | POA: Insufficient documentation

## 2018-08-07 DIAGNOSIS — N183 Chronic kidney disease, stage 3 (moderate): Secondary | ICD-10-CM | POA: Insufficient documentation

## 2018-08-07 LAB — HEMOGLOBIN: HEMOGLOBIN: 10.1 g/dL — AB (ref 12.0–15.0)

## 2018-08-07 MED ORDER — DARBEPOETIN ALFA 100 MCG/0.5ML IJ SOSY
100.0000 ug | PREFILLED_SYRINGE | Freq: Once | INTRAMUSCULAR | Status: AC
  Administered 2018-08-07: 100 ug via SUBCUTANEOUS
  Filled 2018-08-07: qty 0.5

## 2018-08-07 NOTE — Patient Instructions (Signed)

## 2018-08-14 DIAGNOSIS — H40003 Preglaucoma, unspecified, bilateral: Secondary | ICD-10-CM | POA: Diagnosis not present

## 2018-08-14 DIAGNOSIS — E113513 Type 2 diabetes mellitus with proliferative diabetic retinopathy with macular edema, bilateral: Secondary | ICD-10-CM | POA: Diagnosis not present

## 2018-08-19 ENCOUNTER — Ambulatory Visit (INDEPENDENT_AMBULATORY_CARE_PROVIDER_SITE_OTHER): Payer: Medicare Other | Admitting: Cardiology

## 2018-08-19 ENCOUNTER — Encounter: Payer: Self-pay | Admitting: Cardiology

## 2018-08-19 VITALS — BP 160/64 | HR 73 | Ht 62.5 in | Wt 143.0 lb

## 2018-08-19 DIAGNOSIS — M542 Cervicalgia: Secondary | ICD-10-CM | POA: Diagnosis not present

## 2018-08-19 DIAGNOSIS — E1139 Type 2 diabetes mellitus with other diabetic ophthalmic complication: Secondary | ICD-10-CM | POA: Diagnosis not present

## 2018-08-19 DIAGNOSIS — E782 Mixed hyperlipidemia: Secondary | ICD-10-CM

## 2018-08-19 DIAGNOSIS — Z794 Long term (current) use of insulin: Secondary | ICD-10-CM

## 2018-08-19 DIAGNOSIS — I420 Dilated cardiomyopathy: Secondary | ICD-10-CM

## 2018-08-19 DIAGNOSIS — M25612 Stiffness of left shoulder, not elsewhere classified: Secondary | ICD-10-CM | POA: Diagnosis not present

## 2018-08-19 DIAGNOSIS — M62512 Muscle wasting and atrophy, not elsewhere classified, left shoulder: Secondary | ICD-10-CM | POA: Diagnosis not present

## 2018-08-19 DIAGNOSIS — M62511 Muscle wasting and atrophy, not elsewhere classified, right shoulder: Secondary | ICD-10-CM | POA: Diagnosis not present

## 2018-08-19 NOTE — Patient Instructions (Signed)
Medication Instructions:  Your physician recommends that you continue on your current medications as directed. Please refer to the Current Medication list given to you today.  If you need a refill on your cardiac medications before your next appointment, please call your pharmacy.   Lab work: Your physician recommends that you have lab work today: BMP  If you have labs (blood work) drawn today and your tests are completely normal, you will receive your results only by: Marland Kitchen MyChart Message (if you have MyChart) OR . A paper copy in the mail If you have any lab test that is abnormal or we need to change your treatment, we will call you to review the results.  Testing/Procedures: Your physician has requested that you have an echocardiogram. Echocardiography is a painless test that uses sound waves to create images of your heart. It provides your doctor with information about the size and shape of your heart and how well your heart's chambers and valves are working. This procedure takes approximately one hour. There are no restrictions for this procedure.  Follow-Up: At [facility], you and your health needs are our priority.  As part of our continuing mission to provide you with exceptional heart care, we have created designated Provider Care Teams.  These Care Teams include your primary Cardiologist (physician) and Advanced Practice Providers (APPs -  Physician Assistants and Nurse Practitioners) who all work together to provide you with the care you need, when you need it. You will need a follow up appointment in 5 months.  Please call our office 2 months in advance to schedule this appointment.  You may see [name], MD or another member of our [facility] Provider Team in [place]: Shirlee More, MD . Jyl Heinz, MD  Any Other Special Instructions Will Be Listed Below (If Applicable).

## 2018-08-19 NOTE — Progress Notes (Signed)
Cardiology Office Note:    Date:  08/19/2018   ID:  Elizabeth Olsen, DOB 1941/10/10, MRN [id number]  PCP:  [name], MD  Cardiologist:  [name], MD    Referring MD: [name], MD   Chief Complaint  Patient presents with  . 2 month follow up  Doing well  History of Present Illness:    Elizabeth Olsen is a 77 y.o. 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  Comes today to office for follow-up described to have some shortness of breath with exertion.  No swelling of lower extremities.  She described however the fact that her diabetes very poorly controlled and she admits that this is helpful because she does not eat right.  She tells me last hemoglobin A1c was 12.  I see number from the summer and 10.  Still unacceptably high she understand she is trying to make some changes doing diet to help with that she admits that she like to be tropical fruits but obviously have high sugar content. Denies having any chest pain tightness squeezing pressure burning chest exertional shortness of breath is there  Past Medical History:  Diagnosis Date  . Anemia   . Blood transfusion   . Blood transfusion without reported diagnosis    with breast surgery  . Breast cancer ([place]) 15 years ago   left   . CHF (congestive heart failure) ([place])   . 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 Diabetes mellitus    dx 1998.  was told prior to getting chemo that her bld sugar rose.  She thought it would go back  . Dyspnea    d/t anemia  . GERD (gastroesophageal reflux disease)   . Heart murmur    as child  . Hernia, incisional   . Hyperlipidemia   . Hypertension   . Hypothyroidism   . Vitamin D deficiency     Past Surgical History:  Procedure Laterality Date  . ABDOMINAL HYSTERECTOMY    . APPENDECTOMY    . BREAST SURGERY    . CHOLECYSTECTOMY  01/22/2018   LAPROSCOPIC   . CHOLECYSTECTOMY N/A 01/22/2018   Procedure:  LAPAROSCOPIC CHOLECYSTECTOMY WITH INTRAOPERATIVE CHOLANGIOGRAM;  Surgeon: Donnie Mesa, MD;  Location: [facility];  Service: General;  Laterality: N/A;  . COLONOSCOPY  2013   due next 03-2017  . EYE SURGERY     bil cataracts  . IUD REMOVAL     with appendectomy  . MASTECTOMY, MODIFIED RADICAL W/RECONSTRUCTION Left 15 years ago   10 nodes out  . TUMOR EXCISION Left    x 2, neck, head    Current Medications: Current Meds  Medication Sig  . atorvastatin (LIPITOR) 10 MG tablet Take 1 tablet (10 mg total) by mouth daily.  . calcium carbonate (OSCAL) 1500 (600 Ca) MG TABS tablet Take 600 mg by mouth daily with breakfast.   . carvedilol (COREG) 6.25 MG tablet Take 1 tablet (6.25 mg total) by mouth 2 (two) times daily.  . fenofibrate 160 MG tablet Take 160 mg by mouth daily.  . fish oil-omega-3 fatty acids 1000 MG capsule Take 1 g by mouth daily.   . furosemide (LASIX) 20 MG tablet Take 3 tablets (60 mg total) by mouth daily.  . Insulin Glargine (LANTUS SOLOSTAR) 100 UNIT/ML Solostar Pen Inject 7 Units into the skin 2 (two) times daily.  . insulin lispro (HUMALOG KWIKPEN) 100 UNIT/ML KiwkPen Inject 5-20 Units  into the skin 3 (three) times daily. Sliding Scale  . iron polysaccharides (NIFEREX) 150 MG capsule Take 150 mg by mouth 2 (two) times daily.   Marland Kitchen levothyroxine (SYNTHROID, LEVOTHROID) 112 MCG tablet Take 112 mcg by mouth See admin instructions. Take 1 tablet (112 mcg) by mouth on 6 days of the week; hold on Sundays.  Marland Kitchen losartan (COZAAR) 100 MG tablet Take 50 mg by mouth daily.   . Menthol-Methyl Salicylate (MUSCLE RUB) 10-15 % CREA Apply 1 application topically 4 (four) times daily as needed for muscle pain.   . Multiple Vitamins-Minerals (MULTIVITAMIN WITH MINERALS) tablet Take 1 tablet by mouth daily.  Glory Rosebush VERIO test strip USE TO TEST UP TO 10 TIMES D  . OVER THE COUNTER MEDICATION Take 5 mLs by mouth daily.   . ranitidine (ZANTAC) 300 MG capsule Take 300 mg by mouth 2 (two) times  daily.   . vitamin C (ASCORBIC ACID) 500 MG tablet Take 500 mg by mouth daily.  . Vitamin D, Ergocalciferol, (DRISDOL) 50000 UNITS CAPS Take 50,000 Units by mouth 2 (two) times a week. Sundays & Wednesdays.     Allergies:   Erythromycin; Penicillins; Nyquil [pseudoeph-doxylamine-dm-apap]; and Sulfa antibiotics   Social History   Socioeconomic History  . Marital status: Married    Spouse name: Not on file  . Number of children: 3  . Years of education: Not on file  . Highest education level: Not on file  Occupational History  . Occupation: homemaker  Social Needs  . Financial resource strain: Not on file  . Food insecurity:    Worry: Not on file    Inability: Not on file  . Transportation needs:    Medical: Not on file    Non-medical: Not on file  Tobacco Use  . Smoking status: Never Smoker  . Smokeless tobacco: Never Used  Substance and Sexual Activity  . Alcohol use: No  . Drug use: No  . Sexual activity: Not on file  Lifestyle  . Physical activity:    Days per week: Not on file    Minutes per session: Not on file  . Stress: Not on file  Relationships  . Social connections:    Talks on phone: Not on file    Gets together: Not on file    Attends religious service: Not on file    Active member of club or organization: Not on file    Attends meetings of clubs or organizations: Not on file    Relationship status: Not on file  Other Topics Concern  . Not on file  Social History Narrative  . Not on file     Family History: The patient's family history includes Congestive Heart Failure in her father; Diabetes in her other; Heart attack in her brother; Hypertension in her father, maternal grandfather, and maternal grandmother; Peripheral vascular disease in her father; Stomach cancer in her maternal grandmother. There is no history of Colon cancer, Esophageal cancer, Pancreatic cancer, or Rectal cancer. ROS:   Please see the history of present illness.    All 14 point  review of systems negative except as described per history of present illness  EKGs/Labs/Other Studies Reviewed:      Recent Labs: 10/13/2017: B Natriuretic Peptide 332.0 10/14/2017: TSH 0.980 10/29/2017: NT-Pro BNP 2,010 12/26/2017: ALT 14 04/13/2018: BUN 26; Creatinine, Ser 1.30; Potassium 4.4; Sodium 139 05/15/2018: Platelet Count 365 08/07/2018: Hemoglobin 10.1  Recent Lipid Panel    Component Value Date/Time   CHOL 184 07/23/2017  1538   TRIG 91 07/23/2017 1538   HDL 77 07/23/2017 1538   CHOLHDL 2.4 07/23/2017 1538   LDLCALC 89 07/23/2017 1538    Physical Exam:    VS:  BP (!) 160/64   Pulse 73   Ht 5' 2.5" (1.588 m)   Wt 143 lb (64.9 kg)   SpO2 94%   BMI 25.74 kg/m     Wt Readings from Last 3 Encounters:  08/19/18 143 lb (64.9 kg)  05/15/18 148 lb 8 oz (67.4 kg)  04/01/18 150 lb (68 kg)     GEN:  Well nourished, well developed in no acute distress HEENT: Normal NECK: No JVD; No carotid bruits LYMPHATICS: No lymphadenopathy CARDIAC: RRR, no murmurs, no rubs, no gallops RESPIRATORY:  Clear to auscultation without rales, wheezing or rhonchi  ABDOMEN: Soft, non-tender, non-distended MUSCULOSKELETAL:  No edema; No deformity  SKIN: Warm and dry LOWER EXTREMITIES: no swelling NEUROLOGIC:  Alert and oriented x 3 PSYCHIATRIC:  Normal affect   ASSESSMENT:    1. Dilated cardiomyopathy ([place])   2. Type 2 diabetes mellitus with other ophthalmic complication, with long-term current use of insulin ([place])   3. Mixed hyperlipidemia    PLAN:    In order of problems listed above:  1. Dilated cardiomyopathy.  Stand to check her echocardiogram which I will schedule her to have.  She does have kidney dysfunction however still because her I will check her Chem-7 today.  I will not alter any of her medications for now. 2. Type 2 diabetes with ophthalmologic complication poorly controlled which is related to her lack of compliance.  She promised to do better.  And I stressed  importance of proper control of diabetes. 3. Mixed dyslipidemia last fasting lipid profile was acceptable however because of her noncompliance with diet and diabetes I think we need to increase dose of Lipitor.  See her back in my office in about 5 months sooner if she get a problem   Medication Adjustments/Labs and Tests Ordered: Current medicines are reviewed at length with the patient today.  Concerns regarding medicines are outlined above.  No orders of the defined types were placed in this encounter.  Medication changes: No orders of the defined types were placed in this encounter.   Signed, Park Liter, MD, [facility] 08/19/2018 10:15 AM    [place]

## 2018-08-19 NOTE — Addendum Note (Signed)
Addended by: Aleatha Borer on: 08/19/2018 10:28 AM   Modules accepted: Orders

## 2018-08-20 ENCOUNTER — Other Ambulatory Visit: Payer: Self-pay | Admitting: Cardiology

## 2018-08-20 LAB — BASIC METABOLIC PANEL
BUN/Creatinine Ratio: 22 (ref 12–28)
BUN: 43 mg/dL — AB (ref 8–27)
CALCIUM: 10.4 mg/dL — AB (ref 8.7–10.3)
CHLORIDE: 94 mmol/L — AB (ref 96–106)
CO2: 33 mmol/L — AB (ref 20–29)
Creatinine, Ser: 1.94 mg/dL — ABNORMAL HIGH (ref 0.57–1.00)
GFR calc non Af Amer: 25 mL/min/{1.73_m2} — ABNORMAL LOW (ref >59)
GFR, EST AFRICAN AMERICAN: 28 mL/min/{1.73_m2} — AB (ref >59)
Glucose: 84 mg/dL (ref 65–99)
POTASSIUM: 5.9 mmol/L — AB (ref 3.5–5.2)
Sodium: 141 mmol/L (ref 134–144)

## 2018-08-21 ENCOUNTER — Telehealth: Payer: Self-pay | Admitting: Emergency Medicine

## 2018-08-21 ENCOUNTER — Other Ambulatory Visit: Payer: Self-pay

## 2018-08-21 DIAGNOSIS — E875 Hyperkalemia: Secondary | ICD-10-CM

## 2018-08-21 NOTE — Telephone Encounter (Signed)
Left message for patient to return call regarding lab results.  

## 2018-08-22 DIAGNOSIS — M25612 Stiffness of left shoulder, not elsewhere classified: Secondary | ICD-10-CM | POA: Diagnosis not present

## 2018-08-22 DIAGNOSIS — M542 Cervicalgia: Secondary | ICD-10-CM | POA: Diagnosis not present

## 2018-08-22 DIAGNOSIS — M62511 Muscle wasting and atrophy, not elsewhere classified, right shoulder: Secondary | ICD-10-CM | POA: Diagnosis not present

## 2018-08-22 DIAGNOSIS — M62512 Muscle wasting and atrophy, not elsewhere classified, left shoulder: Secondary | ICD-10-CM | POA: Diagnosis not present

## 2018-08-26 ENCOUNTER — Other Ambulatory Visit: Payer: Self-pay | Admitting: Cardiology

## 2018-08-26 DIAGNOSIS — M62512 Muscle wasting and atrophy, not elsewhere classified, left shoulder: Secondary | ICD-10-CM | POA: Diagnosis not present

## 2018-08-26 DIAGNOSIS — M62511 Muscle wasting and atrophy, not elsewhere classified, right shoulder: Secondary | ICD-10-CM | POA: Diagnosis not present

## 2018-08-26 DIAGNOSIS — M542 Cervicalgia: Secondary | ICD-10-CM | POA: Diagnosis not present

## 2018-08-26 DIAGNOSIS — M25612 Stiffness of left shoulder, not elsewhere classified: Secondary | ICD-10-CM | POA: Diagnosis not present

## 2018-08-26 DIAGNOSIS — E875 Hyperkalemia: Secondary | ICD-10-CM | POA: Diagnosis not present

## 2018-08-27 LAB — BASIC METABOLIC PANEL
BUN/Creatinine Ratio: 27 (ref 12–28)
BUN: 39 mg/dL — ABNORMAL HIGH (ref 8–27)
CHLORIDE: 96 mmol/L (ref 96–106)
CO2: 27 mmol/L (ref 20–29)
Calcium: 9.5 mg/dL (ref 8.7–10.3)
Creatinine, Ser: 1.44 mg/dL — ABNORMAL HIGH (ref 0.57–1.00)
GFR calc Af Amer: 41 mL/min/{1.73_m2} — ABNORMAL LOW (ref >59)
GFR calc non Af Amer: 35 mL/min/{1.73_m2} — ABNORMAL LOW (ref >59)
GLUCOSE: 390 mg/dL — AB (ref 65–99)
POTASSIUM: 5.4 mmol/L — AB (ref 3.5–5.2)
SODIUM: 137 mmol/L (ref 134–144)

## 2018-08-29 ENCOUNTER — Telehealth: Payer: Self-pay | Admitting: Emergency Medicine

## 2018-08-29 DIAGNOSIS — M62512 Muscle wasting and atrophy, not elsewhere classified, left shoulder: Secondary | ICD-10-CM | POA: Diagnosis not present

## 2018-08-29 DIAGNOSIS — M542 Cervicalgia: Secondary | ICD-10-CM | POA: Diagnosis not present

## 2018-08-29 DIAGNOSIS — M62511 Muscle wasting and atrophy, not elsewhere classified, right shoulder: Secondary | ICD-10-CM | POA: Diagnosis not present

## 2018-08-29 DIAGNOSIS — M25612 Stiffness of left shoulder, not elsewhere classified: Secondary | ICD-10-CM | POA: Diagnosis not present

## 2018-08-29 MED ORDER — LOSARTAN POTASSIUM 25 MG PO TABS: 25.0000 mg | ORAL_TABLET | Freq: Every day | ORAL | 3 refills | Status: DC

## 2018-08-29 NOTE — Telephone Encounter (Signed)
Patient informed of lab results. She was advised to decrease losartan 25 mg daily. Also informed her of poorly controlled diabetes, she has been following up with her pcp about this they are currently working on getting her a dietician. Patient verbally understands.

## 2018-09-02 DIAGNOSIS — M62512 Muscle wasting and atrophy, not elsewhere classified, left shoulder: Secondary | ICD-10-CM | POA: Diagnosis not present

## 2018-09-02 DIAGNOSIS — M62511 Muscle wasting and atrophy, not elsewhere classified, right shoulder: Secondary | ICD-10-CM | POA: Diagnosis not present

## 2018-09-02 DIAGNOSIS — M542 Cervicalgia: Secondary | ICD-10-CM | POA: Diagnosis not present

## 2018-09-02 DIAGNOSIS — M25612 Stiffness of left shoulder, not elsewhere classified: Secondary | ICD-10-CM | POA: Diagnosis not present

## 2018-09-02 NOTE — Progress Notes (Signed)
[facility]  Telephone:([phone]) [phone] Fax:([phone]) [phone]  Clinic Follow Up Note   Patient Care Team: [name], MD as PCP - General (Family Medicine) Park Liter, MD as PCP - Cardiology (Cardiology) Reynold Bowen, MD as Consulting Physician (Endocrinology) Bobbye Charleston, MD as Consulting Physician (Obstetrics and Gynecology) 09/04/2018  CHIEF COMPLAINTS:  F/u anemia of chronic disease   HISTORY OF PRESENTING ILLNESS (11/07/2017):  Elizabeth Olsen 77 y.o. female is here because of  anemia. She was referred by Dr. Alyson Ingles at [place] for her worsening microcytic hypochromic anemia despite iron supplementation. Her Hgb was 9, iron sat 18% and her iron level was 58 per most recent labs on 10/09/17. Pt states she has been diagnosed as anemic for over a year. Pt reports increasing fatigue over the past year and states doing errands and chores is sometimes difficult. Pt has some leg swelling that she treats with Lasix. Pt denies night sweats.   Pt has a PMHx of CHF, DM, Breast CA. She is treated by a cardiologist, Dr. Agustin Cree and her DM is monitored by her PCP, Dr. Alyson Ingles. Pt's CA was treated by Dr. Benay Spice in 1998. Pt underwent a left breast mastectomy and chemotherapy. She does not currently take any AEOT. She does get yearly screening mammograms. No other Fhx of CA. No tobacco use.   On review of systems, pt denies fever, chills, rash, mouth sores, decreased appetite, urinary complaints. Denies pain. Pt denies abdominal pain, nausea, vomiting. Pertinent positives are listed and detailed within the above HPI.   CURRENT THERAPY: Aranesp injection 148mg monthly started on 01/01/18  INTERVAL HISTORY:  LMONTEEN TOOPSis here for follow up. She has seen Dr. KAgustin Creefor DOE and cardiomyopathy. Today, she is here alone. She knows that her BG is running high. She feels thirsty all the time, and therefore drinks a lot of fluids. She is tolerating the injection  well, and has noticed increased energy. She complains of fatigue. She takes 2 iron pills a day.    MEDICAL HISTORY:  Past Medical History:  Diagnosis Date  . Anemia   . Blood transfusion   . Blood transfusion without reported diagnosis    with breast surgery  . [place] ago   left   . CHF ([place]estive heart failure) ([place]   . 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 Diabetes mellitus    dx 1998.  was told prior to getting chemo that her bld sugar rose.  She thought it would go back  . Dyspnea    d/t anemia  . GERD (gastroesophageal reflux disease)   . Heart murmur    as child  . Hernia, incisional   . Hyperlipidemia   . Hypertension   . Hypothyroidism   . Vitamin D deficiency     SURGICAL HISTORY: Past Surgical History:  Procedure Laterality Date  . ABDOMINAL HYSTERECTOMY    . APPENDECTOMY    . BREAST SURGERY    . CHOLECYSTECTOMY  01/22/2018   LAPROSCOPIC   . CHOLECYSTECTOMY N/A 01/22/2018   Procedure: LAPAROSCOPIC CHOLECYSTECTOMY WITH INTRAOPERATIVE CHOLANGIOGRAM;  Surgeon: TDonnie Mesa MD;  [place]  Service: General;  Laterality: N/A;  . COLONOSCOPY  2013   due next 03-2017  . EYE SURGERY     bil cataracts  . IUD REMOVAL     with appendectomy  . MASTECTOMY, MODIFIED RADICAL W/RECONSTRUCTION Left 15 years ago   10 nodes out  . TUMOR EXCISION Left  x 2, neck, head    SOCIAL HISTORY: Social History   Socioeconomic History  . Marital status: Married    Spouse name: Not on file  . Number of children: 3  . Years of education: Not on file  . Highest education level: Not on file  Occupational History  . Occupation: homemaker  Social Needs  . Financial resource strain: Not on file  . Food insecurity:    Worry: Not on file    Inability: Not on file  . Transportation needs:    Medical: Not on file    Non-medical: Not on file  Tobacco Use  . Smoking status: Never Smoker  . Smokeless tobacco: Never Used  Substance and Sexual  Activity  . Alcohol use: No  . Drug use: No  . Sexual activity: Not on file  Lifestyle  . Physical activity:    Days per week: Not on file    Minutes per session: Not on file  . Stress: Not on file  Relationships  . Social connections:    Talks on phone: Not on file    Gets together: Not on file    Attends religious service: Not on file    Active member of club or organization: Not on file    Attends meetings of clubs or organizations: Not on file    Relationship status: Not on file  . Intimate partner violence:    Fear of current or ex partner: Not on file    Emotionally abused: Not on file    Physically abused: Not on file    Forced sexual activity: Not on file  Other Topics Concern  . Not on file  Social History Narrative  . Not on file    FAMILY HISTORY: Family History  Problem Relation Age of Onset  . Diabetes Other        both sides of family  . Hypertension Father   . Congestive Heart Failure Father   . Peripheral vascular disease Father   . Hypertension Maternal Grandfather   . Hypertension Maternal Grandmother   . Stomach cancer Maternal Grandmother        GGM  . Heart attack Brother   . Colon cancer Neg Hx   . Esophageal cancer Neg Hx   . Pancreatic cancer Neg Hx   . Rectal cancer Neg Hx     ALLERGIES:  is allergic to erythromycin; penicillins; nyquil [pseudoeph-doxylamine-dm-apap]; and sulfa antibiotics.  MEDICATIONS:  Current Outpatient Medications  Medication Sig Dispense Refill  . atorvastatin (LIPITOR) 10 MG tablet TAKE 1 TABLET BY MOUTH EVERY DAY ONE TIME ONLY (Patient taking differently: Take 10 mg by mouth daily. ) 90 tablet 2  . calcium carbonate (OSCAL) 1500 (600 Ca) MG TABS tablet Take 600 mg by mouth daily with breakfast.     . carvedilol (COREG) 6.25 MG tablet Take 1 tablet (6.25 mg total) by mouth 2 (two) times daily. 60 tablet 11  . fenofibrate 160 MG tablet Take 160 mg by mouth daily.    . fish oil-omega-3 fatty acids 1000 MG capsule  Take 1 g by mouth daily.     . furosemide (LASIX) 20 MG tablet Take 3 tablets (60 mg total) by mouth daily. (Patient taking differently: Take 40 mg by mouth daily. ) 270 tablet 3  . Insulin Glargine (LANTUS SOLOSTAR) 100 UNIT/ML Solostar Pen Inject 8 Units into the skin 2 (two) times daily.     . insulin lispro (HUMALOG KWIKPEN) 100 UNIT/ML KiwkPen Inject 5-20 Units  into the skin 3 (three) times daily. Sliding Scale    . iron polysaccharides (NIFEREX) 150 MG capsule Take 150 mg by mouth 2 (two) times daily.     Marland Kitchen levothyroxine (SYNTHROID, LEVOTHROID) 112 MCG tablet Take 112 mcg by mouth See admin instructions. Take 1 tablet (112 mcg) by mouth on 6 days of the week; hold on Sundays.    Marland Kitchen losartan (COZAAR) 25 MG tablet Take 1 tablet (25 mg total) by mouth daily. 90 tablet 3  . Menthol-Methyl Salicylate (MUSCLE RUB) 10-15 % CREA Apply 1 application topically 4 (four) times daily as needed for muscle pain.     . Multiple Vitamins-Minerals (MULTIVITAMIN WITH MINERALS) tablet Take 1 tablet by mouth daily.    Glory Rosebush VERIO test strip USE TO TEST UP TO 10 TIMES D  12  . ranitidine (ZANTAC) 300 MG capsule Take 300 mg by mouth daily as needed for heartburn.     . vitamin C (ASCORBIC ACID) 500 MG tablet Take 500 mg by mouth daily.    . Vitamin D, Ergocalciferol, (DRISDOL) 50000 UNITS CAPS Take 50,000 Units by mouth 2 (two) times a week. Sundays & Wednesdays.     No current facility-administered medications for this visit.     REVIEW OF SYSTEMS:   Constitutional: Denies fevers, chills or abnormal night sweats (+) fatigue Eyes: Denies blurriness of vision, double vision or watery eyes Ears, nose, mouth, throat, and face: Denies mucositis or sore throat (+) feels thirsty with dry mouth Respiratory: Denies cough, dyspnea or wheezes Cardiovascular: Denies palpitation, chest discomfort (+) lower extremity swelling Gastrointestinal:  Denies nausea, heartburn or change in bowel habits Skin: Denies abnormal  skin rashes Lymphatics: Denies new lymphadenopathy or easy bruising Neurological:Denies numbness, tingling or new weaknesses Behavioral/Psych: Mood is stable, no new changes  All other systems were reviewed with the patient and are negative.  PHYSICAL EXAMINATION: ECOG PERFORMANCE STATUS: 1 - Symptomatic but completely ambulatory   Today's Vitals   09/04/18 1146 09/04/18 1150  BP: (!) 196/89   Pulse: 74   Resp: 18   Temp: 98.4 F (36.9 C)   TempSrc: Oral   SpO2: 100%   Weight: 144 lb 14.4 oz (65.7 kg)   Height: 5' 2.5" (1.588 m)   PainSc:  0-No pain    GENERAL:alert, no distress and comfortable SKIN: skin color, texture, turgor are normal, no rashes or significant lesions EYES: normal, conjunctiva are pink and non-injected, sclera clear OROPHARYNX:no exudate, no erythema and lips, buccal mucosa, and tongue normal  NECK: supple, thyroid normal size, non-tender, without nodularity LYMPH:  no palpable lymphadenopathy in the cervical, axillary or inguinal LUNGS: clear to auscultation and percussion with normal breathing effort HEART: regular rate & rhythm and no murmurs and no lower extremity edema ABDOMEN:abdomen soft, non-tender and normal bowel sounds (+) she has 4 laparoscopic incisions that are healed well  Musculoskeletal:no cyanosis of digits and no clubbing  PSYCH: alert & oriented x 3 with fluent speech NEURO: no focal motor/sensory deficits  LABORATORY DATA:  I have reviewed the data as listed CBC Latest Ref Rng & Units 09/04/2018 09/04/2018 08/07/2018  WBC 4.0 - 10.5 K/uL 6.4 6.0 -  Hemoglobin 12.0 - 15.0 g/dL 9.9(L) 9.7(L) 10.1(L)  Hematocrit 36.0 - 46.0 % 32.3(L) 30.3(L) -  Platelets 150 - 400 K/uL 295 293 -    CMP Latest Ref Rng & Units 09/04/2018 09/04/2018 08/26/2018  Glucose 70 - 99 mg/dL 478(H) 694(HH) 390(H)  BUN 8 - 23 mg/dL 42(H) 38(H) 39(H)  Creatinine 0.44 - 1.00 mg/dL 1.23(H) 1.60(H) 1.44(H)  Sodium 135 - 145 mmol/L 133(L) 133(L) 137  Potassium 3.5 -  5.1 mmol/L 4.6 5.1 5.4(H)  Chloride 98 - 111 mmol/L 93(L) 97(L) 96  CO2 22 - 32 mmol/L '30 27 27  ' Calcium 8.9 - 10.3 mg/dL 9.6 9.4 9.5  Total Protein 6.5 - 8.1 g/dL - 6.9 -  Total Bilirubin 0.3 - 1.2 mg/dL - 0.2(L) -  Alkaline Phos 38 - 126 U/L - 56 -  AST 15 - 41 U/L - 21 -  ALT 0 - 44 U/L - 14 -   Iron/TIBC/Ferritin/ %Sat    Component Value Date/Time   IRON 56 11/07/2017 1608   TIBC 344 11/07/2017 1608   FERRITIN 332 (H) 11/07/2017 1608   IRONPCTSAT 16 (L) 11/07/2017 1608    PROCEDURE: Colonoscopy 08/09/17 FINDINGS - The perianal and digital rectal examinations were normal. - A 7 mm polyp was found in the descending colon. The polyp was sessile. The polyp was removed with a cold snare. Resection and retrieval were complete. - Internal hemorrhoids were found during retroflexion. The hemorrhoids were small and Grade I (internal hemorrhoids that do not prolapse). - The exam was otherwise without abnormality on direct and retroflexion views.  PATHOLOGY  Diagnosis 11/21/17 Bone Marrow, Aspirate,Biopsy, and Clot - HYPERCELLULAR BONE MARROW WITH GRANULOCYTIC HYPERPLASIA. - SEVERAL LYMPHOID AGGREGATES PRESENT. - SEE COMMENT. PERIPHERAL BLOOD: - NORMOCYTIC-NORMOCHROMIC ANEMIA. Diagnosis Note The bone marrow is hypercellular for age with granulocytic hyperplasia. Significant dyspoiesis is not present in myeloid cell lines and hence the changes are likely secondary in nature. In this background, there are several interstitial lymphoid aggregates mostly composed of small lymphoid cells. Immunohistochemical stains and flow cytometric analysis failed to show any phenotypic abnormalities most consistent with a reactive lymphoid process. Correlation with cytogenetic studies is recommended. (BNS:gt, 12-05-2017) Interpretation 11/21/17 Bone Marrow Flow Cytometry - NO MONOCLONAL B-CELL POPULATION OR ABNORMAL T-CELL PHENOTYPE IDENTIFIED.  Cytogenetic Analysis 11/21/17 Normal: Cytogenetic  analysis revealed the presence of normal female chromosomes with no observable clonal chromosomal abnormalities.   RADIOGRAPHIC STUDIES: I have personally reviewed the radiological images as listed and agreed with the findings in the report. Dg Chest 2 View  Result Date: 09/04/2018 CLINICAL DATA:  Short of breath hyperglycemia EXAM: CHEST - 2 VIEW COMPARISON:  04/13/2018 FINDINGS: The heart size and mediastinal contours are within normal limits. Both lungs are clear. The visualized skeletal structures are unremarkable. Surgical clips in left breast from prior biopsy. IMPRESSION: No active cardiopulmonary disease. Electronically Signed   By: Franchot Gallo M.D.   On: 09/04/2018 14:55    ASSESSMENT & PLAN:  Elizabeth Olsen is a 77 y.o. female with anemia, CHF,DM, and CKD.  1. Anemia of chronic diseases  -I previously reviewed her previous CBC and anemia workup, she had normal ferritin, iron study, M62 and folic acid in 9476, ret was low, normal MCV.  No lab evidence for hemolysis or multiple myeloma.  Her erythropoietin level was normal. -She does have chronic disease including uncontrolled diabetes, congestive heart failure, and mild CKD, which can cause anemia of chronic disease -Pt underwent a bone marrow biopsy in 11/21/17 with Mendel Ryder.  I previously discussed her bone marrow biopsy with pathologist Dr. Gari Crown, she does have hypercellular bone marrow, with granulocytic hyperplasia, no evidence of dysplasia.  We feel her pulmonary change is likely secondary, no evidence of MDS or other primary marrow disease.  I think this is consistent with anemia of chronic disease.  -Since  her Hgb is below 9 most of time, she has started monthly Aranasp injection, she is tolerating well. -Labs reviewed today, Hgb 9.7. CMP showed BG 694. I advised her to see her endocrinologist Dr. Forde Dandy today and I called his office  -Due to her significant uncontrolled hypertension, I will hold Aranesp injection today -Continue  monthly CBC and Aranesp injection -F/u in 6 months   2. CHF and uncontrolled hypertension  -Pt sees a cardiologist, Dr. Agustin Cree regularly. She takes 40 mg Lasix  -Her blood pressure is very high, I strongly encouraged her to follow-up with her cardiologist  3. DM with hyperglycemia - Follow up with Dr. Forde Dandy  -Lab reviewed, her glucose was 694, bicarb normal at 27, creatinine 1.6.  Due to her extreme high blood glucose level today, I called Dr. Forde Dandy office, and she will be seen in the office this afternoon.  4. Stage III CKD -Likely secondary to her diabetes.   -I previously advised her to drink plenty of fluids and to control her diabetes.   PLAN: -Due to her severe hypertension and hyperglycemia, I will hold Aranesp injection today -Lab and injection every months x4 -F/u in 6 months -I called Dr. Baldwin Crown office, she will be seen in the afternoon today  All questions were answered. The patient knows to call the clinic with any problems, questions or concerns . I spent 20 counseling the patient face to face. The total time spent in the appointment was 30 and more than 50% was on counseling.  Dierdre Searles Dweik am acting as scribe for Dr. Truitt Merle.  I have reviewed the above documentation for accuracy and completeness, and I agree with the above.    Truitt Merle, MD 09/04/18

## 2018-09-03 ENCOUNTER — Other Ambulatory Visit: Payer: Self-pay | Admitting: Hematology

## 2018-09-03 DIAGNOSIS — D638 Anemia in other chronic diseases classified elsewhere: Secondary | ICD-10-CM

## 2018-09-03 DIAGNOSIS — N183 Chronic kidney disease, stage 3 (moderate): Secondary | ICD-10-CM | POA: Diagnosis not present

## 2018-09-04 ENCOUNTER — Telehealth: Payer: Self-pay

## 2018-09-04 ENCOUNTER — Encounter: Payer: Self-pay | Admitting: Hematology

## 2018-09-04 ENCOUNTER — Emergency Department (HOSPITAL_COMMUNITY)
Admission: EM | Admit: 2018-09-04 | Discharge: 2018-09-04 | Disposition: A | Discharge status: Home / Self Care | Payer: Medicare Other | Attending: Emergency Medicine | Admitting: Emergency Medicine

## 2018-09-04 ENCOUNTER — Inpatient Hospital Stay: Payer: Medicare Other | Attending: Hematology

## 2018-09-04 ENCOUNTER — Other Ambulatory Visit: Payer: Self-pay

## 2018-09-04 ENCOUNTER — Inpatient Hospital Stay: Payer: Medicare Other

## 2018-09-04 ENCOUNTER — Encounter (HOSPITAL_COMMUNITY): Payer: Self-pay

## 2018-09-04 ENCOUNTER — Emergency Department (HOSPITAL_COMMUNITY): Payer: Medicare Other

## 2018-09-04 ENCOUNTER — Inpatient Hospital Stay (HOSPITAL_BASED_OUTPATIENT_CLINIC_OR_DEPARTMENT_OTHER): Payer: Medicare Other | Admitting: Hematology

## 2018-09-04 VITALS — BP 196/89 | HR 74 | Temp 98.4°F | Resp 18 | Ht 62.5 in | Wt 144.9 lb

## 2018-09-04 DIAGNOSIS — I110 Hypertensive heart disease with heart failure: Secondary | ICD-10-CM | POA: Diagnosis not present

## 2018-09-04 DIAGNOSIS — I130 Hypertensive heart and chronic kidney disease with heart failure and stage 1 through stage 4 chronic kidney disease, or unspecified chronic kidney disease: Secondary | ICD-10-CM | POA: Insufficient documentation

## 2018-09-04 DIAGNOSIS — D638 Anemia in other chronic diseases classified elsewhere: Secondary | ICD-10-CM

## 2018-09-04 DIAGNOSIS — N183 Chronic kidney disease, stage 3 (moderate): Secondary | ICD-10-CM | POA: Diagnosis not present

## 2018-09-04 DIAGNOSIS — I509 Heart failure, unspecified: Secondary | ICD-10-CM | POA: Insufficient documentation

## 2018-09-04 DIAGNOSIS — Z794 Long term (current) use of insulin: Secondary | ICD-10-CM | POA: Insufficient documentation

## 2018-09-04 DIAGNOSIS — E1122 Type 2 diabetes mellitus with diabetic chronic kidney disease: Secondary | ICD-10-CM | POA: Diagnosis not present

## 2018-09-04 DIAGNOSIS — I10 Essential (primary) hypertension: Secondary | ICD-10-CM

## 2018-09-04 DIAGNOSIS — E039 Hypothyroidism, unspecified: Secondary | ICD-10-CM | POA: Insufficient documentation

## 2018-09-04 DIAGNOSIS — R5383 Other fatigue: Secondary | ICD-10-CM

## 2018-09-04 DIAGNOSIS — R0602 Shortness of breath: Secondary | ICD-10-CM | POA: Diagnosis not present

## 2018-09-04 DIAGNOSIS — E1165 Type 2 diabetes mellitus with hyperglycemia: Secondary | ICD-10-CM

## 2018-09-04 DIAGNOSIS — R739 Hyperglycemia, unspecified: Secondary | ICD-10-CM

## 2018-09-04 DIAGNOSIS — Z79899 Other long term (current) drug therapy: Secondary | ICD-10-CM | POA: Diagnosis not present

## 2018-09-04 LAB — CBC
HCT: 32.3 % — ABNORMAL LOW (ref 36.0–46.0)
Hemoglobin: 9.9 g/dL — ABNORMAL LOW (ref 12.0–15.0)
MCH: 28.4 pg (ref 26.0–34.0)
MCHC: 30.7 g/dL (ref 30.0–36.0)
MCV: 92.6 fL (ref 80.0–100.0)
Platelets: 295 10*3/uL (ref 150–400)
RBC: 3.49 MIL/uL — AB (ref 3.87–5.11)
RDW: 14.1 % (ref 11.5–15.5)
WBC: 6.4 10*3/uL (ref 4.0–10.5)
nRBC: 0 % (ref 0.0–0.2)

## 2018-09-04 LAB — CMP (CANCER CENTER ONLY)
ALBUMIN: 3.5 g/dL (ref 3.5–5.0)
ALK PHOS: 56 U/L (ref 38–126)
ALT: 14 U/L (ref 0–44)
AST: 21 U/L (ref 15–41)
Anion gap: 9 (ref 5–15)
BUN: 38 mg/dL — AB (ref 8–23)
CALCIUM: 9.4 mg/dL (ref 8.9–10.3)
CO2: 27 mmol/L (ref 22–32)
CREATININE: 1.6 mg/dL — AB (ref 0.44–1.00)
Chloride: 97 mmol/L — ABNORMAL LOW (ref 98–111)
GFR, Est AFR Am: 35 mL/min — ABNORMAL LOW (ref >60)
GFR, Estimated: 30 mL/min — ABNORMAL LOW (ref >60)
GLUCOSE: 694 mg/dL — AB (ref 70–99)
Potassium: 5.1 mmol/L (ref 3.5–5.1)
Sodium: 133 mmol/L — ABNORMAL LOW (ref 135–145)
Total Bilirubin: 0.2 mg/dL — ABNORMAL LOW (ref 0.3–1.2)
Total Protein: 6.9 g/dL (ref 6.5–8.1)

## 2018-09-04 LAB — URINALYSIS, ROUTINE W REFLEX MICROSCOPIC
BILIRUBIN URINE: NEGATIVE
Bacteria, UA: NONE SEEN
Hgb urine dipstick: NEGATIVE
KETONES UR: NEGATIVE mg/dL
Leukocytes, UA: NEGATIVE
NITRITE: NEGATIVE
PROTEIN: 30 mg/dL — AB
Specific Gravity, Urine: 1.008 (ref 1.005–1.030)
pH: 7 (ref 5.0–8.0)

## 2018-09-04 LAB — CBG MONITORING, ED
Glucose-Capillary: 481 mg/dL — ABNORMAL HIGH (ref 70–99)
Glucose-Capillary: 397 mg/dL — ABNORMAL HIGH (ref 70–99)
Glucose-Capillary: 304 mg/dL — ABNORMAL HIGH (ref 70–99)

## 2018-09-04 LAB — BASIC METABOLIC PANEL
ANION GAP: 10 (ref 5–15)
BUN: 42 mg/dL — ABNORMAL HIGH (ref 8–23)
CO2: 30 mmol/L (ref 22–32)
Calcium: 9.6 mg/dL (ref 8.9–10.3)
Chloride: 93 mmol/L — ABNORMAL LOW (ref 98–111)
Creatinine, Ser: 1.23 mg/dL — ABNORMAL HIGH (ref 0.44–1.00)
GFR, EST AFRICAN AMERICAN: 48 mL/min — AB (ref >60)
GFR, EST NON AFRICAN AMERICAN: 42 mL/min — AB (ref >60)
Glucose, Bld: 478 mg/dL — ABNORMAL HIGH (ref 70–99)
POTASSIUM: 4.6 mmol/L (ref 3.5–5.1)
SODIUM: 133 mmol/L — AB (ref 135–145)

## 2018-09-04 LAB — CBC WITH DIFFERENTIAL (CANCER CENTER ONLY)
Abs Immature Granulocytes: 0.02 10*3/uL (ref 0.00–0.07)
Basophils Absolute: 0 10*3/uL (ref 0.0–0.1)
Basophils Relative: 1 %
EOS PCT: 2 %
Eosinophils Absolute: 0.1 10*3/uL (ref 0.0–0.5)
HEMATOCRIT: 30.3 % — AB (ref 36.0–46.0)
HEMOGLOBIN: 9.7 g/dL — AB (ref 12.0–15.0)
Immature Granulocytes: 0 %
LYMPHS ABS: 1.3 10*3/uL (ref 0.7–4.0)
Lymphocytes Relative: 21 %
MCH: 29 pg (ref 26.0–34.0)
MCHC: 32 g/dL (ref 30.0–36.0)
MCV: 90.4 fL (ref 80.0–100.0)
MONOS PCT: 10 %
Monocytes Absolute: 0.6 10*3/uL (ref 0.1–1.0)
Neutro Abs: 3.9 10*3/uL (ref 1.7–7.7)
Neutrophils Relative %: 66 %
Platelet Count: 293 10*3/uL (ref 150–400)
RBC: 3.35 MIL/uL — ABNORMAL LOW (ref 3.87–5.11)
RDW: 14.1 % (ref 11.5–15.5)
WBC Count: 6 10*3/uL (ref 4.0–10.5)
nRBC: 0 % (ref 0.0–0.2)

## 2018-09-04 MED ORDER — INSULIN ASPART 100 UNIT/ML ~~LOC~~ SOLN
6.0000 [IU] | Freq: Once | SUBCUTANEOUS | Status: DC
  Filled 2018-09-04: qty 1

## 2018-09-04 MED ORDER — INSULIN ASPART 100 UNIT/ML ~~LOC~~ SOLN
6.0000 [IU] | Freq: Once | SUBCUTANEOUS | Status: AC
  Administered 2018-09-04: 6 [IU] via SUBCUTANEOUS

## 2018-09-04 MED ORDER — DARBEPOETIN ALFA 100 MCG/0.5ML IJ SOSY: 100.0000 ug | PREFILLED_SYRINGE | Freq: Once | INTRAMUSCULAR | Status: DC

## 2018-09-04 NOTE — ED Notes (Signed)
Pt ambulated to restroom without assistance.

## 2018-09-04 NOTE — Patient Instructions (Signed)

## 2018-09-04 NOTE — Progress Notes (Signed)
Spoke with Dr. Raul Del nurse she will call patient and get her in this afternoon for her blood glucose and BP.

## 2018-09-04 NOTE — Telephone Encounter (Signed)
[place] to speak to Dr. Forde Dandy, out of the office today, Dr. Brigitte Pulse taking calls, left her nurse a voice message that patient's glucose today is 694 and her BP is elevated. This is after morning insulin and BP medications.   Requested return call to see PCP and endocrinologist asap.

## 2018-09-04 NOTE — Discharge Instructions (Signed)
Follow-up with your primary care, cardiologist, and endocrinologist.  Use your insulin at home to help keep your sugar down.  You may need adjustment of your insulin or your blood pressure medicines.

## 2018-09-04 NOTE — ED Provider Notes (Signed)
[facility] DEPT Provider Note   CSN: [id number] Arrival date & time: 09/04/18  1303     History   Chief Complaint Chief Complaint  Patient presents with  . Hypertension  . Hyperglycemia    HPI Elizabeth Olsen is a 77 y.o. female.  HPI Patient sent in by oncology for hypertension and hyperglycemia.  Patient is diabetic and has history of hypertension.  States she was going to get a shot for her low hemoglobin.  States that her sugars been running up to 600 at home sometimes.  Has an endocrinologist a primary care doctor and a cardiologist that are managing her medicines.  No chest pain.  Does have some trouble breathing.  States worse with exertion.  States she is seeing cardiology for this.  States she did take her medicines this morning.  She is on insulin also. Past Medical History:  Diagnosis Date  . Anemia   . Blood transfusion   . Blood transfusion without reported diagnosis    with breast surgery  . Breast cancer ([place]) 15 years ago   left   . CHF (congestive heart failure) ([place])   . 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 Diabetes mellitus    dx 1998.  was told prior to getting chemo that her bld sugar rose.  She thought it would go back  . Dyspnea    d/t anemia  . GERD (gastroesophageal reflux disease)   . Heart murmur    as child  . Hernia, incisional   . Hyperlipidemia   . Hypertension   . Hypothyroidism   . Vitamin D deficiency     Patient Active Problem List   Diagnosis Date Noted  . Near syncope 04/01/2018  . 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 Anemia of chronic disease 12/10/2017  . Refractory anemia, unspecified ([place]) 12/10/2017  . Anemia 11/07/2017  . Acute systolic heart failure ([place]) 10/13/2017  . Acute respiratory failure with hypoxia ([place]) 10/13/2017  . Hypothyroid 10/13/2017  . Anemia associated with diabetes mellitus ([place]) 10/13/2017  . Diabetes mellitus ([place]) 07/23/2017  . Hyperlipidemia  07/23/2017  . Iron deficiency 07/23/2017  . Cardiomyopathy ([place]) 07/23/2017    Past Surgical History:  Procedure Laterality Date  . ABDOMINAL HYSTERECTOMY    . APPENDECTOMY    . BREAST SURGERY    . CHOLECYSTECTOMY  01/22/2018   LAPROSCOPIC   . CHOLECYSTECTOMY N/A 01/22/2018   Procedure: LAPAROSCOPIC CHOLECYSTECTOMY WITH INTRAOPERATIVE CHOLANGIOGRAM;  Surgeon: Donnie Mesa, MD;  Location: [facility];  Service: General;  Laterality: N/A;  . COLONOSCOPY  2013   due next 03-2017  . EYE SURGERY     bil cataracts  . IUD REMOVAL     with appendectomy  . MASTECTOMY, MODIFIED RADICAL W/RECONSTRUCTION Left 15 years ago   10 nodes out  . TUMOR EXCISION Left    x 2, neck, head     OB History   None      Home Medications    Prior to Admission medications   Medication Sig Start Date End Date Taking? Authorizing Provider  atorvastatin (LIPITOR) 10 MG tablet TAKE 1 TABLET BY MOUTH EVERY DAY ONE TIME ONLY Patient taking differently: Take 10 mg by mouth daily.  08/20/18  Yes Park Liter, MD  carvedilol (COREG) 6.25 MG tablet Take 1 tablet (6.25 mg total) by mouth 2 (two) times daily. 08/10/17 01/11/19 Yes Park Liter, MD  fenofibrate 160 MG tablet Take 160 mg by mouth daily.  Yes [provider]  furosemide (LASIX) 20 MG tablet Take 3 tablets (60 mg total) by mouth daily. Patient taking differently: Take 40 mg by mouth daily.  12/25/17 04/02/19 Yes Park Liter, MD  Insulin Glargine (LANTUS SOLOSTAR) 100 UNIT/ML Solostar Pen Inject 8 Units into the skin 2 (two) times daily.    Yes [provider]  insulin lispro (HUMALOG KWIKPEN) 100 UNIT/ML KiwkPen Inject 5-20 Units into the skin 3 (three) times daily. Sliding Scale   Yes [provider]  iron polysaccharides (NIFEREX) 150 MG capsule Take 150 mg by mouth 2 (two) times daily.    Yes [provider]  levothyroxine (SYNTHROID, LEVOTHROID) 112 MCG tablet Take 112 mcg by mouth See admin  instructions. Take 1 tablet (112 mcg) by mouth on 6 days of the week; hold on Sundays.   Yes [provider]  losartan (COZAAR) 25 MG tablet Take 1 tablet (25 mg total) by mouth daily. 08/29/18 11/27/18 Yes Park Liter, MD  Menthol-Methyl Salicylate (MUSCLE RUB) 10-15 % CREA Apply 1 application topically 4 (four) times daily as needed for muscle pain.    Yes [provider]  Multiple Vitamins-Minerals (MULTIVITAMIN WITH MINERALS) tablet Take 1 tablet by mouth daily.   Yes [provider]  ranitidine (ZANTAC) 300 MG capsule Take 300 mg by mouth daily as needed for heartburn.    Yes [provider]  vitamin C (ASCORBIC ACID) 500 MG tablet Take 500 mg by mouth daily.   Yes [provider]  Vitamin D, Ergocalciferol, (DRISDOL) 50000 UNITS CAPS Take 50,000 Units by mouth 2 (two) times a week. Sundays & Wednesdays. 02/20/12  Yes [provider]  calcium carbonate (OSCAL) 1500 (600 Ca) MG TABS tablet Take 600 mg by mouth daily with breakfast.     [provider]  fish oil-omega-3 fatty acids 1000 MG capsule Take 1 g by mouth daily.     [provider]  [facility] VERIO test strip USE TO TEST UP TO 10 TIMES D 08/24/17   [provider]    Family History Family History  Problem Relation Age of Onset  . Diabetes Other        both sides of family  . Hypertension Father   . Congestive Heart Failure Father   . Peripheral vascular disease Father   . Hypertension Maternal Grandfather   . Hypertension Maternal Grandmother   . Stomach cancer Maternal Grandmother        GGM  . Heart attack Brother   . Colon cancer Neg Hx   . Esophageal cancer Neg Hx   . Pancreatic cancer Neg Hx   . Rectal cancer Neg Hx     Social History Social History   Tobacco Use  . Smoking status: Never Smoker  . Smokeless tobacco: Never Used  Substance Use Topics  . Alcohol use: No  . Drug use: No     Allergies   Erythromycin;  Penicillins; Nyquil [pseudoeph-doxylamine-dm-apap]; and Sulfa antibiotics   Review of Systems Review of Systems  Constitutional: Negative for appetite change.  HENT: Negative for congestion.   Respiratory: Positive for shortness of breath.   Cardiovascular: Negative for chest pain.  Gastrointestinal: Negative for abdominal pain.  Genitourinary: Negative for flank pain.  Musculoskeletal: Negative for back pain.  Skin: Negative for rash.  Neurological: Negative for weakness.  Psychiatric/Behavioral: Negative for confusion.     Physical Exam Updated Vital Signs BP (!) 205/84 (BP Location: Right Arm)   Pulse 74  Temp (!) 97.3 F (36.3 C) (Oral)   Resp 18   Ht 5\' 2"  (1.575 m)   Wt 64.9 kg   SpO2 100%   BMI 26.15 kg/m   Physical Exam  Constitutional: She appears well-developed.  HENT:  Head: Normocephalic.  Eyes: Pupils are equal, round, and reactive to light.  Neck: Neck supple.  Cardiovascular: Normal rate.  Pulmonary/Chest: Effort normal.  Abdominal: There is no tenderness.  Musculoskeletal: She exhibits no edema.  Neurological: She is alert.  Skin: Skin is warm. Capillary refill takes less than 2 seconds.     ED Treatments / Results  Labs (all labs ordered are listed, but only abnormal results are displayed) Labs Reviewed  BASIC METABOLIC PANEL - Abnormal; Notable for the following components:      Result Value   Sodium 133 (*)    Chloride 93 (*)    Glucose, Bld 478 (*)    BUN 42 (*)    Creatinine, Ser 1.23 (*)    GFR calc non Af Amer 42 (*)    GFR calc Af Amer 48 (*)    All other components within normal limits  CBC - Abnormal; Notable for the following components:   RBC 3.49 (*)    Hemoglobin 9.9 (*)    HCT 32.3 (*)    All other components within normal limits  URINALYSIS, ROUTINE W REFLEX MICROSCOPIC - Abnormal; Notable for the following components:   Color, Urine STRAW (*)    Glucose, UA >=500 (*)    Protein, ur 30 (*)    All other components  within normal limits  CBG MONITORING, ED - Abnormal; Notable for the following components:   Glucose-Capillary 481 (*)    All other components within normal limits  CBG MONITORING, ED - Abnormal; Notable for the following components:   Glucose-Capillary 397 (*)    All other components within normal limits  CBG MONITORING, ED    EKG None  Radiology Dg Chest 2 View  Result Date: 09/04/2018 CLINICAL DATA:  Short of breath hyperglycemia EXAM: CHEST - 2 VIEW COMPARISON:  04/13/2018 FINDINGS: The heart size and mediastinal contours are within normal limits. Both lungs are clear. The visualized skeletal structures are unremarkable. Surgical clips in left breast from prior biopsy. IMPRESSION: No active cardiopulmonary disease. Electronically Signed   By: Franchot Gallo M.D.   On: 09/04/2018 14:55    Procedures Procedures (including critical care time)  Medications Ordered in ED Medications  insulin aspart (novoLOG) injection 6 Units (has no administration in time range)     Initial Impression / Assessment and Plan / ED Course  I have reviewed the triage vital signs and the nursing notes.  Pertinent labs & imaging results that were available during my care of the patient were reviewed by me and considered in my medical decision making (see chart for details).     Patient with high blood pressure.  No endorgan damage.  Outpatient management of blood pressure medicine should be fine.  Hyperglycemia without DKA.  Sugar improved somewhat here.  Has insulin at home.  Will discharge from  Final Clinical Impressions(s) / ED Diagnoses   Final diagnoses:  Hypertension, unspecified type  Hyperglycemia    ED Discharge Orders    None       Davonna Belling, MD 09/04/18 1549

## 2018-09-04 NOTE — ED Triage Notes (Signed)
Pt goes to the cancer center monthly. Pt states that she went over there, when the RN over there stated that her blood sugar and blood pressure were "too high" for treatment.

## 2018-09-04 NOTE — ED Notes (Signed)
Patient transported to X-ray 

## 2018-09-05 ENCOUNTER — Telehealth: Payer: Self-pay

## 2018-09-05 DIAGNOSIS — E1140 Type 2 diabetes mellitus with diabetic neuropathy, unspecified: Secondary | ICD-10-CM | POA: Diagnosis not present

## 2018-09-05 DIAGNOSIS — R739 Hyperglycemia, unspecified: Secondary | ICD-10-CM | POA: Diagnosis not present

## 2018-09-05 DIAGNOSIS — Z6825 Body mass index (BMI) 25.0-25.9, adult: Secondary | ICD-10-CM | POA: Diagnosis not present

## 2018-09-05 DIAGNOSIS — I10 Essential (primary) hypertension: Secondary | ICD-10-CM | POA: Diagnosis not present

## 2018-09-05 NOTE — Telephone Encounter (Signed)
Per 11/6 los patient requested appointments be matched up with husband appointments. Mailed a letter with a calender enclosed. Left a voice messge on the phone concerning these actions

## 2018-09-09 DIAGNOSIS — I10 Essential (primary) hypertension: Secondary | ICD-10-CM | POA: Diagnosis not present

## 2018-09-11 ENCOUNTER — Ambulatory Visit (INDEPENDENT_AMBULATORY_CARE_PROVIDER_SITE_OTHER): Payer: Medicare Other | Admitting: Cardiology

## 2018-09-11 ENCOUNTER — Encounter: Payer: Self-pay | Admitting: Cardiology

## 2018-09-11 VITALS — BP 150/60 | HR 73 | Wt 151.0 lb

## 2018-09-11 DIAGNOSIS — Z794 Long term (current) use of insulin: Secondary | ICD-10-CM

## 2018-09-11 DIAGNOSIS — I420 Dilated cardiomyopathy: Secondary | ICD-10-CM

## 2018-09-11 DIAGNOSIS — E1139 Type 2 diabetes mellitus with other diabetic ophthalmic complication: Secondary | ICD-10-CM

## 2018-09-11 DIAGNOSIS — E782 Mixed hyperlipidemia: Secondary | ICD-10-CM

## 2018-09-11 DIAGNOSIS — E162 Hypoglycemia, unspecified: Secondary | ICD-10-CM

## 2018-09-11 DIAGNOSIS — I10 Essential (primary) hypertension: Secondary | ICD-10-CM

## 2018-09-11 HISTORY — DX: Essential (primary) hypertension: I10

## 2018-09-11 LAB — GLUCOSE, POCT (MANUAL RESULT ENTRY): POC GLUCOSE: 59 mg/dL — AB (ref 70–99)

## 2018-09-11 NOTE — Progress Notes (Signed)
Cardiology Office Note:    Date:  09/11/2018   ID:  Elizabeth Olsen, DOB [birth year], MRN [id number]  PCP:  [name], MD  Cardiologist:  [name], MD    Referring MD: [name], MD   Chief Complaint  Patient presents with  . Hospitalization Follow-up  I was in the hospital  History of Present Illness:    Elizabeth Olsen is a 77 y.o. female with multiple medical problems which include poorly controlled diabetes, poorly controlled hypertension, cardiomyopathy.  She was recently admitted to hospital because of high blood pressure that was appropriately managed she was also find to have hyperkalemia as well as high sugar.  Overall doing well now, denies have any chest pain tightness squeezing pressure burning chest.  Past Medical History:  Diagnosis Date  . Anemia   . Blood transfusion   . Blood transfusion without reported diagnosis    with breast surgery  . Breast cancer ([place]) 15 years ago   left   . CHF (congestive heart failure) ([place])   . 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 Diabetes mellitus    dx 1998.  was told prior to getting chemo that her bld sugar rose.  She thought it would go back  . Dyspnea    d/t anemia  . GERD (gastroesophageal reflux disease)   . Heart murmur    as child  . Hernia, incisional   . Hyperlipidemia   . Hypertension   . Hypothyroidism   . Vitamin D deficiency     Past Surgical History:  Procedure Laterality Date  . ABDOMINAL HYSTERECTOMY    . APPENDECTOMY    . BREAST SURGERY    . CHOLECYSTECTOMY  01/22/2018   LAPROSCOPIC   . CHOLECYSTECTOMY N/A 01/22/2018   Procedure: LAPAROSCOPIC CHOLECYSTECTOMY WITH INTRAOPERATIVE CHOLANGIOGRAM;  Surgeon: Donnie Mesa, MD;  Location: [facility];  Service: General;  Laterality: N/A;  . COLONOSCOPY  2013   due next 03-2017  . EYE SURGERY     bil cataracts  . IUD REMOVAL     with appendectomy  . MASTECTOMY, MODIFIED RADICAL W/RECONSTRUCTION Left 15 years ago   10 nodes out  . TUMOR EXCISION  Left    x 2, neck, head    Current Medications: Current Meds  Medication Sig  . atorvastatin (LIPITOR) 10 MG tablet TAKE 1 TABLET BY MOUTH EVERY DAY ONE TIME ONLY (Patient taking differently: Take 10 mg by mouth daily. )  . calcium carbonate (OSCAL) 1500 (600 Ca) MG TABS tablet Take 600 mg by mouth daily with breakfast.   . carvedilol (COREG) 6.25 MG tablet Take 1 tablet (6.25 mg total) by mouth 2 (two) times daily.  . fenofibrate 160 MG tablet Take 160 mg by mouth daily.  . fish oil-omega-3 fatty acids 1000 MG capsule Take 1 g by mouth daily.   . furosemide (LASIX) 20 MG tablet Take 3 tablets (60 mg total) by mouth daily. (Patient taking differently: Take 40 mg by mouth daily. )  . Insulin Glargine (LANTUS SOLOSTAR) 100 UNIT/ML Solostar Pen Inject 8 Units into the skin 2 (two) times daily.   . insulin lispro (HUMALOG KWIKPEN) 100 UNIT/ML KiwkPen Inject 5-20 Units into the skin 3 (three) times daily. Sliding Scale  . iron polysaccharides (NIFEREX) 150 MG capsule Take 150 mg by mouth 2 (two) times daily.   Marland Kitchen levothyroxine (SYNTHROID, LEVOTHROID) 112 MCG tablet Take 112 mcg by mouth See admin instructions. Take 1 tablet (112 mcg) by mouth on 6 days  of the week; hold on Sundays.  Marland Kitchen losartan (COZAAR) 25 MG tablet Take 1 tablet (25 mg total) by mouth daily.  . Menthol-Methyl Salicylate (MUSCLE RUB) 10-15 % CREA Apply 1 application topically 4 (four) times daily as needed for muscle pain.   [facility] VERIO test strip USE TO TEST UP TO 10 TIMES D  . ranitidine (ZANTAC) 300 MG capsule Take 300 mg by mouth daily as needed for heartburn.   . vitamin C (ASCORBIC ACID) 500 MG tablet Take 500 mg by mouth daily.  . Vitamin D, Ergocalciferol, (DRISDOL) 50000 UNITS CAPS Take 50,000 Units by mouth 2 (two) times a week. Sundays & Wednesdays.     Allergies:   Erythromycin; Penicillins; Nyquil [pseudoeph-doxylamine-dm-apap]; and Sulfa antibiotics   Social History   Socioeconomic History  . Marital status:  Married    Spouse name: Not on file  . Number of children: 3  . Years of education: Not on file  . Highest education level: Not on file  Occupational History  . Occupation: homemaker  Social Needs  . Financial resource strain: Not on file  . Food insecurity:    Worry: Not on file    Inability: Not on file  . Transportation needs:    Medical: Not on file    Non-medical: Not on file  Tobacco Use  . Smoking status: Never Smoker  . Smokeless tobacco: Never Used  Substance and Sexual Activity  . Alcohol use: No  . Drug use: No  . Sexual activity: Not on file  Lifestyle  . Physical activity:    Days per week: Not on file    Minutes per session: Not on file  . Stress: Not on file  Relationships  . Social connections:    Talks on phone: Not on file    Gets together: Not on file    Attends religious service: Not on file    Active member of club or organization: Not on file    Attends meetings of clubs or organizations: Not on file    Relationship status: Not on file  Other Topics Concern  . Not on file  Social History Narrative  . Not on file     Family History: The patient's family history includes Congestive Heart Failure in her father; Diabetes in her other; Heart attack in her brother; Hypertension in her father, maternal grandfather, and maternal grandmother; Peripheral vascular disease in her father; Stomach cancer in her maternal grandmother. There is no history of Colon cancer, Esophageal cancer, Pancreatic cancer, or Rectal cancer. ROS:   Please see the history of present illness.    All 14 point review of systems negative except as described per history of present illness  EKGs/Labs/Other Studies Reviewed:      Recent Labs: 10/13/2017: B Natriuretic Peptide 332.0 10/14/2017: TSH 0.980 10/29/2017: NT-Pro BNP 2,010 09/04/2018: ALT 14; BUN 42; Creatinine, Ser 1.23; Hemoglobin 9.9; Platelets 295; Potassium 4.6; Sodium 133  Recent Lipid Panel    Component Value  Date/Time   CHOL 184 07/23/2017 1538   TRIG 91 07/23/2017 1538   HDL 77 07/23/2017 1538   CHOLHDL 2.4 07/23/2017 1538   LDLCALC 89 07/23/2017 1538    Physical Exam:    VS:  BP (!) 150/60   Pulse 73   Wt 151 lb (68.5 kg)   SpO2 94%   BMI 27.62 kg/m     Wt Readings from Last 3 Encounters:  09/11/18 151 lb (68.5 kg)  09/04/18 143 lb (64.9 kg)  09/04/18 144 lb 14.4 oz (65.7 kg)     GEN:  Well nourished, well developed in no acute distress HEENT: Normal NECK: No JVD; No carotid bruits LYMPHATICS: No lymphadenopathy CARDIAC: RRR, no murmurs, no rubs, no gallops RESPIRATORY:  Clear to auscultation without rales, wheezing or rhonchi  ABDOMEN: Soft, non-tender, non-distended MUSCULOSKELETAL:  No edema; No deformity  SKIN: Warm and dry LOWER EXTREMITIES: no swelling NEUROLOGIC:  Alert and oriented x 3 PSYCHIATRIC:  Normal affect   ASSESSMENT:    1. Low blood sugar   2. Dilated cardiomyopathy ([place])   3. Type 2 diabetes mellitus with other ophthalmic complication, with long-term current use of insulin ([place])   4. Mixed hyperlipidemia   5. Essential hypertension    PLAN:    In order of problems listed above:  1. Cardiomyopathy.  Hemodynamic she is compensated in spite of the fact she gained some weight.  She is on appropriate medication which I will continue for now she was advised to drink plenty of fluids and I cautioned her about doing not drinking too much.  I advised her to check her weight every single day in the morning. 2. Essential hypertension blood pressure still elevated today she was just recently given amlodipine will wait for results of this medication then she will require probably more medication to get her blood pressure better under control I asked her to check her blood pressure and regular basis 1 she is at home. 3. Dyslipidemia we will continue with current medications.   Medication Adjustments/Labs and Tests Ordered: Current medicines are reviewed at  length with the patient today.  Concerns regarding medicines are outlined above.  Orders Placed This Encounter  Procedures  . POCT Glucose (CBG)   Medication changes: No orders of the defined types were placed in this encounter.   Signed, Park Liter, MD, [facility] 09/11/2018 11:25 AM    [place]

## 2018-09-11 NOTE — Patient Instructions (Signed)
Medication Instructions:  Your physician recommends that you continue on your current medications as directed. Please refer to the Current Medication list given to you today.  If you need a refill on your cardiac medications before your next appointment, please call your pharmacy.   Lab work: None ordered If you have labs (blood work) drawn today and your tests are completely normal, you will receive your results only by: Marland Kitchen MyChart Message (if you have MyChart) OR . A paper copy in the mail If you have any lab test that is abnormal or we need to change your treatment, we will call you to review the results.  Testing/Procedures: None ordered  Follow-Up: At [facility], you and your health needs are our priority.  As part of our continuing mission to provide you with exceptional heart care, we have created designated Provider Care Teams.  These Care Teams include your primary Cardiologist (physician) and Advanced Practice Providers (APPs -  Physician Assistants and Nurse Practitioners) who all work together to provide you with the care you need, when you need it. You will need a follow up appointment in 1 months.   You may see [name], MD or another member of our [place] Provider Team in [place]: Shirlee More, MD . Jyl Heinz, MD  Any Other Special Instructions Will Be Listed Below (If Applicable).

## 2018-09-16 DIAGNOSIS — M62512 Muscle wasting and atrophy, not elsewhere classified, left shoulder: Secondary | ICD-10-CM | POA: Diagnosis not present

## 2018-09-16 DIAGNOSIS — M25612 Stiffness of left shoulder, not elsewhere classified: Secondary | ICD-10-CM | POA: Diagnosis not present

## 2018-09-16 DIAGNOSIS — M62511 Muscle wasting and atrophy, not elsewhere classified, right shoulder: Secondary | ICD-10-CM | POA: Diagnosis not present

## 2018-09-16 DIAGNOSIS — M542 Cervicalgia: Secondary | ICD-10-CM | POA: Diagnosis not present

## 2018-09-18 ENCOUNTER — Encounter: Payer: Medicare Other | Attending: Family Medicine | Admitting: *Deleted

## 2018-09-18 DIAGNOSIS — E1122 Type 2 diabetes mellitus with diabetic chronic kidney disease: Secondary | ICD-10-CM | POA: Insufficient documentation

## 2018-09-18 DIAGNOSIS — N189 Chronic kidney disease, unspecified: Secondary | ICD-10-CM | POA: Diagnosis not present

## 2018-09-18 DIAGNOSIS — Z713 Dietary counseling and surveillance: Secondary | ICD-10-CM | POA: Diagnosis not present

## 2018-09-18 NOTE — Patient Instructions (Signed)
Plan:   Aim for 2 Carb Choices per meal (30 grams) +/- 1 either way   Aim for 0-1 Carbs per snack if hungry   Include protein in moderation with your meals and snacks  Consider reading food labels for Total Carbohydrate of foods  Continue with your activity level with arm chair exercises and your physical thereapy for 30 minutes daily as tolerated  Continue checking BG at alternate times per day as directed by MD   Continue taking medication as directed by MD  We discussed the need to have 2 carb choices for the 5 units of insulin at your meals. Consider taking less insulin if you aren't eating any carb choices

## 2018-09-19 DIAGNOSIS — M25612 Stiffness of left shoulder, not elsewhere classified: Secondary | ICD-10-CM | POA: Diagnosis not present

## 2018-09-19 DIAGNOSIS — M62511 Muscle wasting and atrophy, not elsewhere classified, right shoulder: Secondary | ICD-10-CM | POA: Diagnosis not present

## 2018-09-19 DIAGNOSIS — M62512 Muscle wasting and atrophy, not elsewhere classified, left shoulder: Secondary | ICD-10-CM | POA: Diagnosis not present

## 2018-09-19 DIAGNOSIS — M542 Cervicalgia: Secondary | ICD-10-CM | POA: Diagnosis not present

## 2018-09-19 NOTE — Progress Notes (Signed)
Diabetes Self-Management Education  Visit Type: First/Initial  Appt. Start Time: 1400 Appt. End Time: 1530  09/19/2018  Elizabeth Olsen, identified by name and date of birth, is a 77 y.o. female with a diagnosis of Diabetes: Type 2. Patient here with her daughter who participated in the visit in a positive way. Patient concerned about variability of her BG from 40-400 mg/dl in a single day. She has had hypoglycemia on several occasions. She and her daughter expressed interest in [place] CGM as well as potentially an insulin pump if appropriate for her insulin delivery to be more accurate. She typically enjoys being active and does arm chair type exercises on Mondays and Thursdays, and would like to do Zumba as well.   ASSESSMENT  There were no vitals taken for this visit. There is no height or weight on file to calculate BMI.  Diabetes Self-Management Education - 09/18/18 1404      Visit Information   Visit Type  First/Initial      Initial Visit   Diabetes Type  Type 2    Are you currently following a meal plan?  No    Are you taking your medications as prescribed?  Yes    Date Diagnosed  1999      Health Coping   How would you rate your overall health?  Fair      Psychosocial Assessment   Patient Belief/Attitude about Diabetes  Motivated to manage diabetes    Self-care barriers  None    Self-management support  Family    Other persons present  Patient;Family Member    Patient Concerns  Nutrition/Meal planning;Glycemic Control    Preferred Learning Style  No preference indicated    Learning Readiness  Change in progress    How often do you need to have someone help you when you read instructions, pamphlets, or other written materials from your doctor or pharmacy?  1 - Never    What is the last grade level you completed in school?  11      Pre-Education Assessment   Patient understands the diabetes disease and treatment process.  Needs Instruction    Patient understands  incorporating nutritional management into lifestyle.  Needs Review    Patient undertands incorporating physical activity into lifestyle.  Needs Review    Patient understands using medications safely.  Needs Review    Patient understands monitoring blood glucose, interpreting and using results  Needs Review    Patient understands prevention, detection, and treatment of acute complications.  Needs Instruction    Patient understands prevention, detection, and treatment of chronic complications.  Needs Review    Patient understands how to develop strategies to address psychosocial issues.  Needs Review    Patient understands how to develop strategies to promote health/change behavior.  Needs Review      Complications   Last HgB A1C per patient/outside source  12 %    How often do you check your blood sugar?  3-4 times/day    Fasting Blood glucose range (mg/dL)  --   varies from 60-400 mg/dl   Number of hypoglycemic episodes per month  4    Have you had a dilated eye exam in the past 12 months?  Yes    Have you had a dental exam in the past 12 months?  No    Are you checking your feet?  No      Dietary Intake   Breakfast  1/4 c Almond cereal OR Ezekial Bread with  vitamin water    Snack (morning)  used to have fruit - 1/2 green apple    Lunch  left overs (avoids lunch meats) (likes rice or pasta but limits for BG now)    Dinner  meat, salad and vegetables    Beverage(s)  vitamin water, water      Exercise   Exercise Type  Light (walking / raking leaves)    How many days per week to you exercise?  2    How many minutes per day do you exercise?  90    Total minutes per week of exercise  180      Patient Education   Previous Diabetes Education  Yes (please comment)   1 year ago   Disease state   Explored patient's options for treatment of their diabetes    Nutrition management   Role of diet in the treatment of diabetes and the relationship between the three main macronutrients and blood  glucose level;Carbohydrate counting;Food label reading, portion sizes and measuring food.;Reviewed blood glucose goals for pre and post meals and how to evaluate the patients' food intake on their blood glucose level.;Other (comment)   potential modifcations for renal failure   Physical activity and exercise   Identified with patient nutritional and/or medication changes necessary with exercise.    Medications  Reviewed patients medication for diabetes, action, purpose, timing of dose and side effects.    Monitoring  Identified appropriate SMBG and/or A1C goals.    Acute complications  Taught treatment of hypoglycemia - the 15 rule.    Psychosocial adjustment  Role of stress on diabetes      Individualized Goals (developed by patient)   Nutrition  Follow meal plan discussed    Physical Activity  Exercise 3-5 times per week    Medications  take my medication as prescribed    Monitoring   test blood glucose pre and post meals as discussed      Post-Education Assessment   Patient understands the diabetes disease and treatment process.  Demonstrates understanding / competency    Patient understands incorporating nutritional management into lifestyle.  Demonstrates understanding / competency    Patient undertands incorporating physical activity into lifestyle.  Demonstrates understanding / competency    Patient understands using medications safely.  Demonstrates understanding / competency    Patient understands monitoring blood glucose, interpreting and using results  Demonstrates understanding / competency    Patient understands prevention, detection, and treatment of acute complications.  Demonstrates understanding / competency    Patient understands prevention, detection, and treatment of chronic complications.  Demonstrates understanding / competency    Patient understands how to develop strategies to address psychosocial issues.  Demonstrates understanding / competency    Patient understands  how to develop strategies to promote health/change behavior.  Demonstrates understanding / competency      Outcomes   Expected Outcomes  Demonstrated interest in learning. Expect positive outcomes    Future DMSE  PRN    Program Status  Completed       Individualized Plan for Diabetes Self-Management Training:   Learning Objective:  Patient will have a greater understanding of diabetes self-management. Patient education plan is to attend individual and/or group sessions per assessed needs and concerns.   Plan:   Patient Instructions  Plan:   Aim for 2 Carb Choices per meal (30 grams) +/- 1 either way   Aim for 0-1 Carbs per snack if hungry   Include protein in moderation with your meals  and snacks  Consider reading food labels for Total Carbohydrate of foods  Continue with your activity level with arm chair exercises and your physical thereapy for 30 minutes daily as tolerated  Continue checking BG at alternate times per day as directed by MD   Continue taking medication as directed by MD  We discussed the need to have 2 carb choices for the 5 units of insulin at your meals. Consider taking less insulin if you aren't eating any carb choices  Expected Outcomes:  Demonstrated interest in learning. Expect positive outcomes  Education material provided: Food label handouts, A1C conversion sheet, Meal plan card, Snack sheet and Carbohydrate counting sheet, [place] CGM brochure, Insulin Pump comparison sheet  If problems or questions, patient to contact team via:  Phone  Future DSME appointment: PRN

## 2018-09-25 DIAGNOSIS — N183 Chronic kidney disease, stage 3 (moderate): Secondary | ICD-10-CM | POA: Diagnosis not present

## 2018-09-25 DIAGNOSIS — M62512 Muscle wasting and atrophy, not elsewhere classified, left shoulder: Secondary | ICD-10-CM | POA: Diagnosis not present

## 2018-09-25 DIAGNOSIS — M25612 Stiffness of left shoulder, not elsewhere classified: Secondary | ICD-10-CM | POA: Diagnosis not present

## 2018-09-25 DIAGNOSIS — M62511 Muscle wasting and atrophy, not elsewhere classified, right shoulder: Secondary | ICD-10-CM | POA: Diagnosis not present

## 2018-09-25 DIAGNOSIS — M542 Cervicalgia: Secondary | ICD-10-CM | POA: Diagnosis not present

## 2018-09-25 DIAGNOSIS — Z794 Long term (current) use of insulin: Secondary | ICD-10-CM | POA: Diagnosis not present

## 2018-09-25 DIAGNOSIS — I10 Essential (primary) hypertension: Secondary | ICD-10-CM | POA: Diagnosis not present

## 2018-09-25 DIAGNOSIS — E1149 Type 2 diabetes mellitus with other diabetic neurological complication: Secondary | ICD-10-CM | POA: Diagnosis not present

## 2018-09-30 ENCOUNTER — Ambulatory Visit (HOSPITAL_BASED_OUTPATIENT_CLINIC_OR_DEPARTMENT_OTHER)
Admission: RE | Admit: 2018-09-30 | Discharge: 2018-09-30 | Disposition: A | Discharge status: Home / Self Care | Payer: Medicare Other | Source: Ambulatory Visit | Attending: Cardiology | Admitting: Cardiology

## 2018-09-30 DIAGNOSIS — I420 Dilated cardiomyopathy: Secondary | ICD-10-CM | POA: Diagnosis not present

## 2018-09-30 DIAGNOSIS — M62511 Muscle wasting and atrophy, not elsewhere classified, right shoulder: Secondary | ICD-10-CM | POA: Diagnosis not present

## 2018-09-30 DIAGNOSIS — M25612 Stiffness of left shoulder, not elsewhere classified: Secondary | ICD-10-CM | POA: Diagnosis not present

## 2018-09-30 DIAGNOSIS — M542 Cervicalgia: Secondary | ICD-10-CM | POA: Diagnosis not present

## 2018-09-30 DIAGNOSIS — M62512 Muscle wasting and atrophy, not elsewhere classified, left shoulder: Secondary | ICD-10-CM | POA: Diagnosis not present

## 2018-09-30 NOTE — Progress Notes (Signed)
  Echocardiogram 2D Echocardiogram has been performed.  Macey Wurtz T Bubba Vanbenschoten 09/30/2018, 11:42 AM

## 2018-10-01 ENCOUNTER — Inpatient Hospital Stay: Payer: Medicare Other

## 2018-10-01 ENCOUNTER — Inpatient Hospital Stay: Payer: Medicare Other | Attending: Hematology

## 2018-10-01 VITALS — BP 148/62

## 2018-10-01 DIAGNOSIS — D638 Anemia in other chronic diseases classified elsewhere: Secondary | ICD-10-CM | POA: Diagnosis not present

## 2018-10-01 DIAGNOSIS — N183 Chronic kidney disease, stage 3 (moderate): Secondary | ICD-10-CM | POA: Insufficient documentation

## 2018-10-01 DIAGNOSIS — E1165 Type 2 diabetes mellitus with hyperglycemia: Secondary | ICD-10-CM | POA: Diagnosis present

## 2018-10-01 LAB — CBC WITH DIFFERENTIAL (CANCER CENTER ONLY)
Abs Immature Granulocytes: 0.04 10*3/uL (ref 0.00–0.07)
BASOS ABS: 0 10*3/uL (ref 0.0–0.1)
Basophils Relative: 0 %
Eosinophils Absolute: 0.2 10*3/uL (ref 0.0–0.5)
Eosinophils Relative: 3 %
HEMATOCRIT: 25.7 % — AB (ref 36.0–46.0)
HEMOGLOBIN: 8.2 g/dL — AB (ref 12.0–15.0)
IMMATURE GRANULOCYTES: 1 %
LYMPHS ABS: 1.8 10*3/uL (ref 0.7–4.0)
LYMPHS PCT: 23 %
MCH: 29.5 pg (ref 26.0–34.0)
MCHC: 31.9 g/dL (ref 30.0–36.0)
MCV: 92.4 fL (ref 80.0–100.0)
Monocytes Absolute: 1 10*3/uL (ref 0.1–1.0)
Monocytes Relative: 13 %
NEUTROS PCT: 60 %
NRBC: 0 % (ref 0.0–0.2)
Neutro Abs: 4.7 10*3/uL (ref 1.7–7.7)
Platelet Count: 275 10*3/uL (ref 150–400)
RBC: 2.78 MIL/uL — AB (ref 3.87–5.11)
RDW: 14.6 % (ref 11.5–15.5)
WBC Count: 7.8 10*3/uL (ref 4.0–10.5)

## 2018-10-01 LAB — FERRITIN: Ferritin: 222 ng/mL (ref 11–307)

## 2018-10-01 LAB — IRON AND TIBC
Iron: 54 ug/dL (ref 41–142)
Saturation Ratios: 21 % (ref 21–57)
TIBC: 251 ug/dL (ref 236–444)
UIBC: 197 ug/dL (ref 120–384)

## 2018-10-01 MED ORDER — DARBEPOETIN ALFA 100 MCG/0.5ML IJ SOSY
100.0000 ug | PREFILLED_SYRINGE | Freq: Once | INTRAMUSCULAR | Status: AC
  Administered 2018-10-01: 100 ug via SUBCUTANEOUS
  Filled 2018-10-01: qty 0.5

## 2018-10-01 MED ORDER — DARBEPOETIN ALFA 200 MCG/0.4ML IJ SOSY
PREFILLED_SYRINGE | INTRAMUSCULAR | Status: AC
  Filled 2018-10-01: qty 0.4

## 2018-10-07 DIAGNOSIS — M25612 Stiffness of left shoulder, not elsewhere classified: Secondary | ICD-10-CM | POA: Diagnosis not present

## 2018-10-07 DIAGNOSIS — M62512 Muscle wasting and atrophy, not elsewhere classified, left shoulder: Secondary | ICD-10-CM | POA: Diagnosis not present

## 2018-10-07 DIAGNOSIS — M62511 Muscle wasting and atrophy, not elsewhere classified, right shoulder: Secondary | ICD-10-CM | POA: Diagnosis not present

## 2018-10-07 DIAGNOSIS — M542 Cervicalgia: Secondary | ICD-10-CM | POA: Diagnosis not present

## 2018-10-10 IMAGING — CR DG CHEST 2V
2 series · 2 of 2 positions shown · non-contrast
Comparison: Chest x-ray dated 10/13/2017.

CLINICAL DATA: Chest pain and fatigue since moving heavy objects
today.

EXAM:
CHEST - 2 VIEW

[w chest pa]
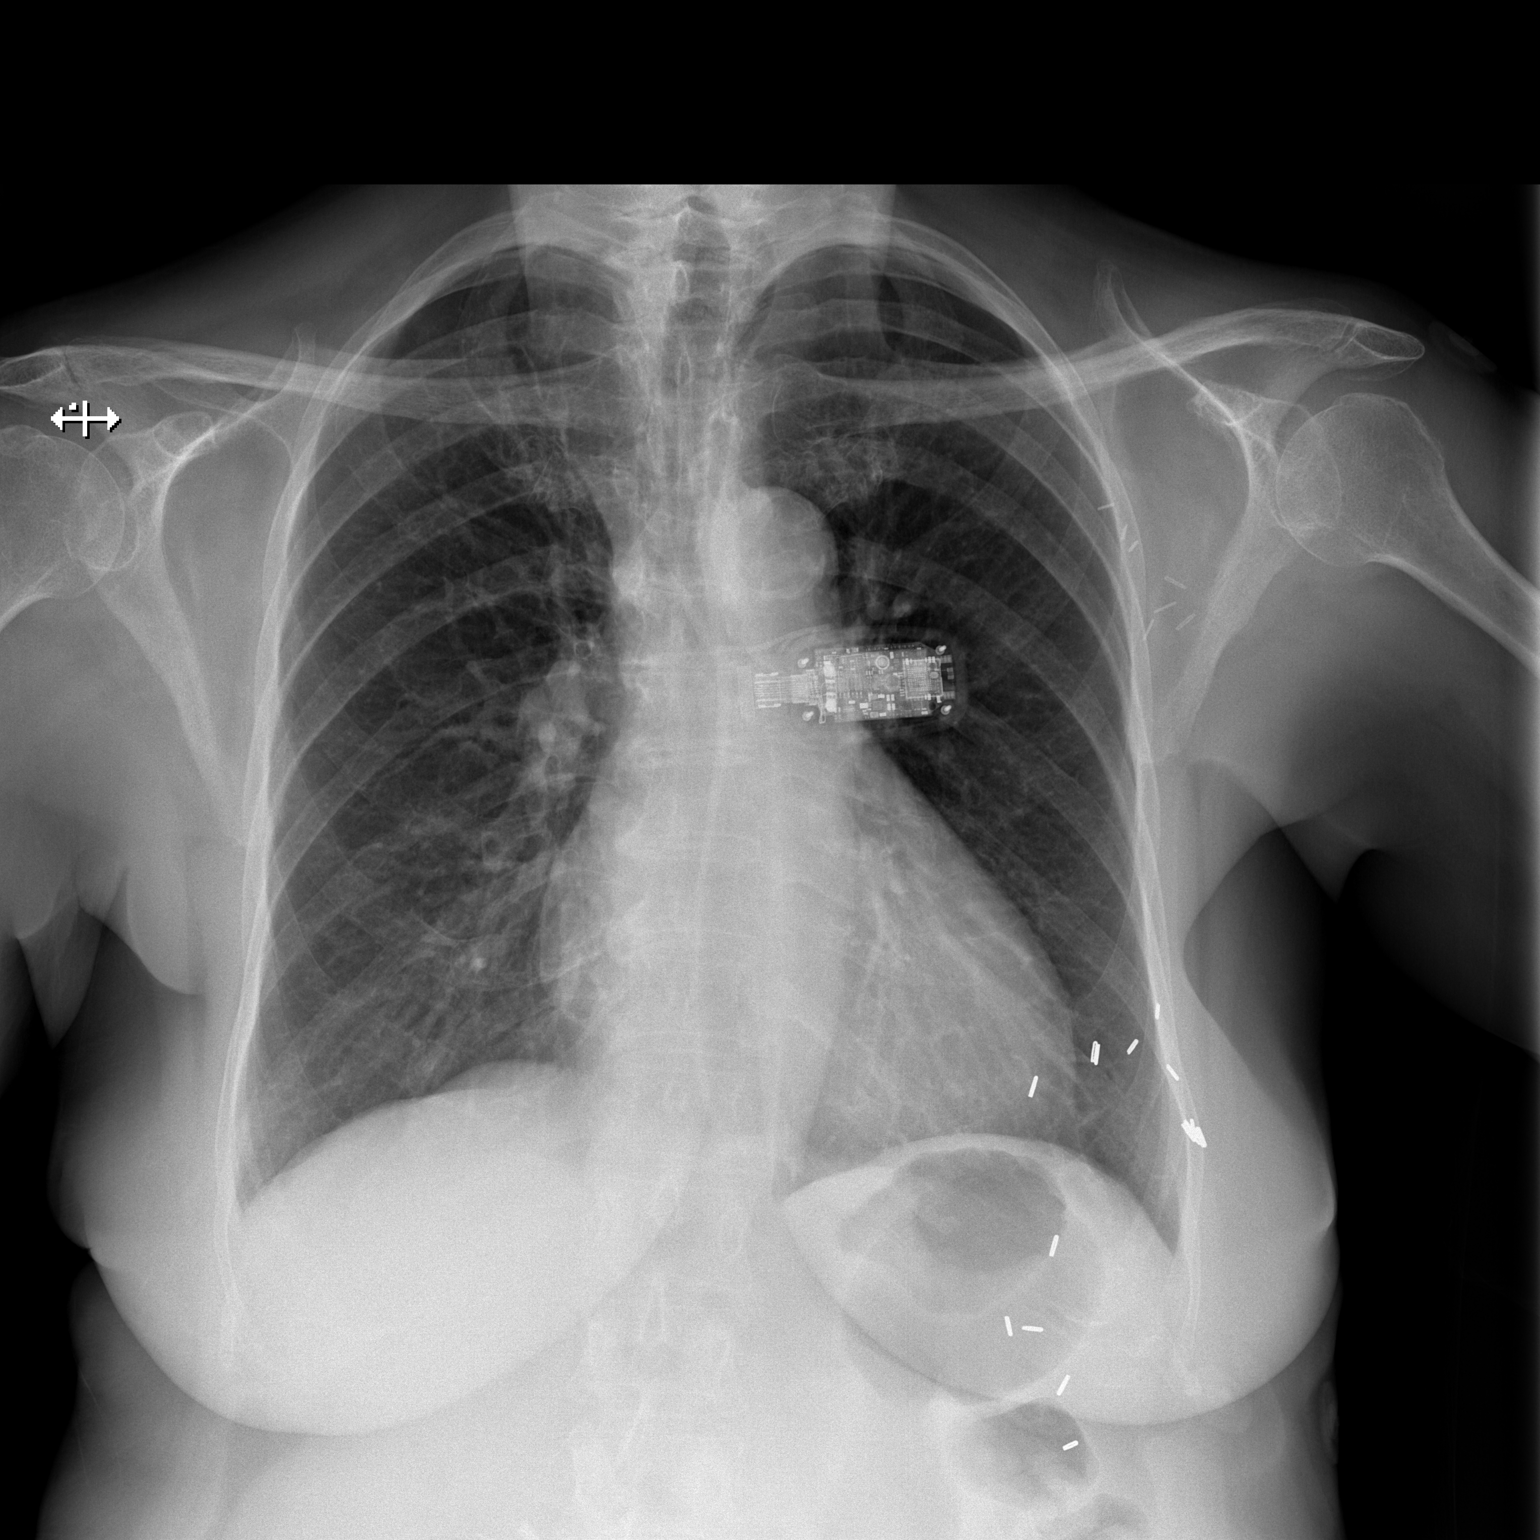

[w chest lat]
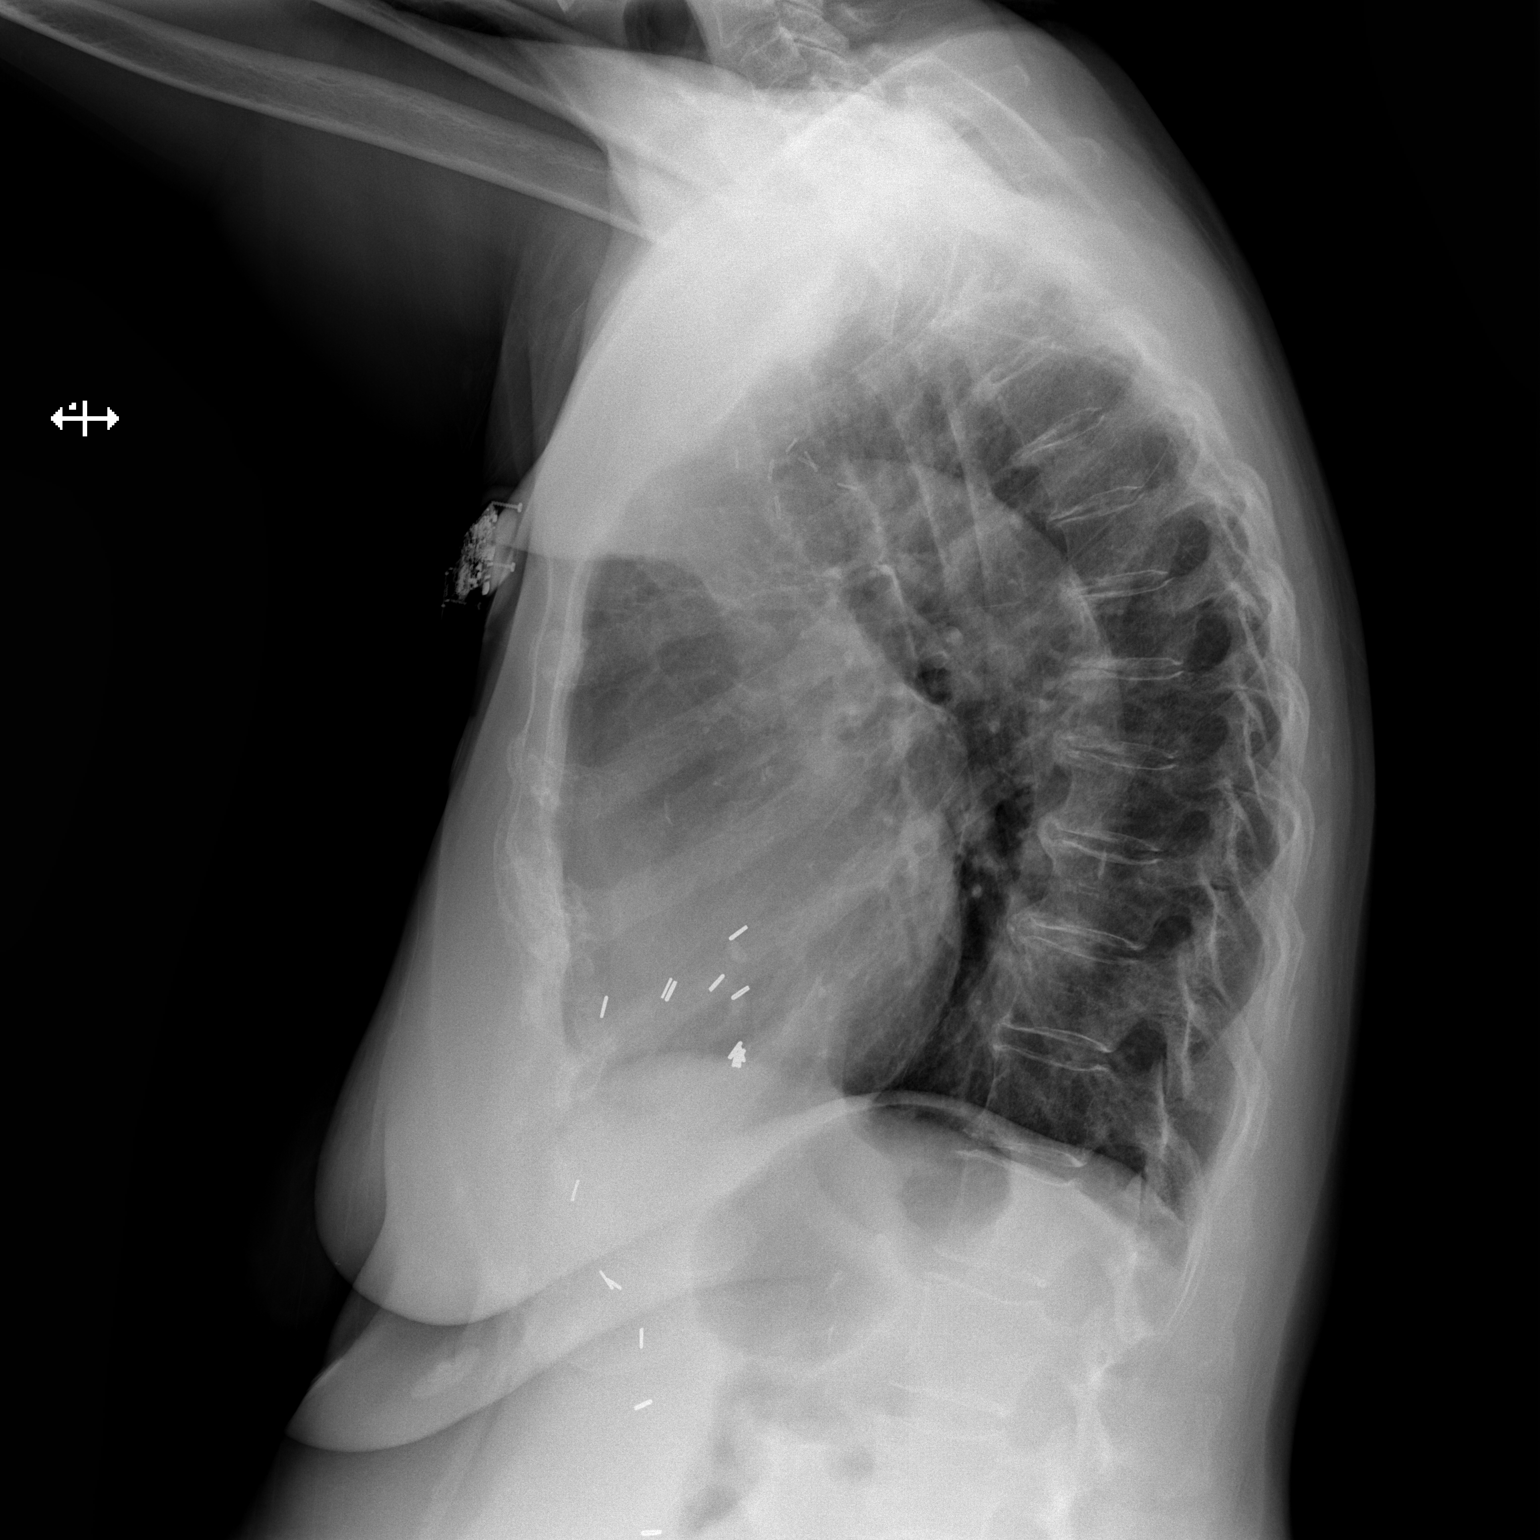

[2 of 2 positions shown; findings below may reference images not displayed]

FINDINGS: Stable mild cardiomegaly. Aortic atherosclerosis. Monitor overlying
the LEFT hilum.

Lungs are clear. No pleural effusion or pneumothorax seen. No acute
or suspicious osseous finding. Mild degenerative spurring throughout
the slightly kyphotic thoracic spine.
IMPRESSION: No active cardiopulmonary disease. No evidence of pneumonia or
pulmonary edema.

## 2018-10-14 DIAGNOSIS — E113513 Type 2 diabetes mellitus with proliferative diabetic retinopathy with macular edema, bilateral: Secondary | ICD-10-CM | POA: Diagnosis not present

## 2018-10-26 ENCOUNTER — Other Ambulatory Visit: Payer: Self-pay | Admitting: Cardiology

## 2018-10-29 ENCOUNTER — Inpatient Hospital Stay: Payer: Medicare Other

## 2018-10-29 VITALS — BP 146/66 | HR 77 | Temp 97.7°F | Resp 18

## 2018-10-29 DIAGNOSIS — N183 Chronic kidney disease, stage 3 (moderate): Secondary | ICD-10-CM | POA: Diagnosis not present

## 2018-10-29 DIAGNOSIS — D638 Anemia in other chronic diseases classified elsewhere: Secondary | ICD-10-CM | POA: Diagnosis not present

## 2018-10-29 LAB — CBC WITH DIFFERENTIAL (CANCER CENTER ONLY)
Abs Immature Granulocytes: 0.02 10*3/uL (ref 0.00–0.07)
BASOS PCT: 1 %
Basophils Absolute: 0 10*3/uL (ref 0.0–0.1)
EOS PCT: 2 %
Eosinophils Absolute: 0.1 10*3/uL (ref 0.0–0.5)
HCT: 31.8 % — ABNORMAL LOW (ref 36.0–46.0)
Hemoglobin: 10 g/dL — ABNORMAL LOW (ref 12.0–15.0)
Immature Granulocytes: 0 %
Lymphocytes Relative: 29 %
Lymphs Abs: 1.7 10*3/uL (ref 0.7–4.0)
MCH: 29.4 pg (ref 26.0–34.0)
MCHC: 31.4 g/dL (ref 30.0–36.0)
MCV: 93.5 fL (ref 80.0–100.0)
MONO ABS: 0.8 10*3/uL (ref 0.1–1.0)
Monocytes Relative: 13 %
NRBC: 0 % (ref 0.0–0.2)
Neutro Abs: 3.3 10*3/uL (ref 1.7–7.7)
Neutrophils Relative %: 55 %
PLATELETS: 298 10*3/uL (ref 150–400)
RBC: 3.4 MIL/uL — AB (ref 3.87–5.11)
RDW: 13.2 % (ref 11.5–15.5)
WBC: 5.9 10*3/uL (ref 4.0–10.5)

## 2018-10-29 LAB — IRON AND TIBC
IRON: 76 ug/dL (ref 41–142)
Saturation Ratios: 32 % (ref 21–57)
TIBC: 241 ug/dL (ref 236–444)
UIBC: 165 ug/dL (ref 120–384)

## 2018-10-29 LAB — FERRITIN: Ferritin: 216 ng/mL (ref 11–307)

## 2018-10-29 MED ORDER — DARBEPOETIN ALFA 100 MCG/0.5ML IJ SOSY
100.0000 ug | PREFILLED_SYRINGE | Freq: Once | INTRAMUSCULAR | Status: AC
  Administered 2018-10-29: 100 ug via SUBCUTANEOUS
  Filled 2018-10-29: qty 0.5

## 2018-10-31 ENCOUNTER — Telehealth: Payer: Self-pay

## 2018-10-31 NOTE — Telephone Encounter (Signed)
Spoke with patient per Dr. Burr Medico notified her lab results show anemia has improved, iron level is adequate, no concerns, patient verbalized an understanding.

## 2018-10-31 NOTE — Telephone Encounter (Signed)
-----   Message from Truitt Merle, MD sent at 10/30/2018  9:16 AM EST ----- Please let pt know her lab results, anemia improved, iron level adequate, no concerns, thanks   Truitt Merle  10/30/2018

## 2018-11-12 ENCOUNTER — Ambulatory Visit: Payer: Medicare Other | Admitting: Cardiology

## 2018-11-14 ENCOUNTER — Ambulatory Visit (INDEPENDENT_AMBULATORY_CARE_PROVIDER_SITE_OTHER): Payer: Medicare Other | Admitting: Cardiology

## 2018-11-14 ENCOUNTER — Ambulatory Visit: Payer: Medicare Other | Admitting: Cardiology

## 2018-11-14 ENCOUNTER — Encounter: Payer: Self-pay | Admitting: Cardiology

## 2018-11-14 VITALS — BP 140/64 | HR 74 | Ht 62.5 in | Wt 148.4 lb

## 2018-11-14 DIAGNOSIS — E782 Mixed hyperlipidemia: Secondary | ICD-10-CM

## 2018-11-14 DIAGNOSIS — I420 Dilated cardiomyopathy: Secondary | ICD-10-CM | POA: Diagnosis not present

## 2018-11-14 DIAGNOSIS — I10 Essential (primary) hypertension: Secondary | ICD-10-CM

## 2018-11-14 NOTE — Patient Instructions (Signed)
Medication Instructions:  Your physician recommends that you continue on your current medications as directed. Please refer to the Current Medication list given to you today.  If you need a refill on your cardiac medications before your next appointment, please call your pharmacy.   Lab work: None ordered If you have labs (blood work) drawn today and your tests are completely normal, you will receive your results only by: Marland Kitchen MyChart Message (if you have MyChart) OR . A paper copy in the mail If you have any lab test that is abnormal or we need to change your treatment, we will call you to review the results.  Testing/Procedures: None ordered  Follow-Up: At [facility], you and your health needs are our priority.  As part of our continuing mission to provide you with exceptional heart care, we have created designated Provider Care Teams.  These Care Teams include your primary Cardiologist (physician) and Advanced Practice Providers (APPs -  Physician Assistants and Nurse Practitioners) who all work together to provide you with the care you need, when you need it. You will need a follow up appointment in 3 months.  Please call our office 2 months in advance to schedule this appointment.  You may see [name], MD or another member of our [place] Provider Team in [place]: Shirlee More, MD . Jyl Heinz, MD  Any Other Special Instructions Will Be Listed Below (If Applicable).

## 2018-11-14 NOTE — Progress Notes (Signed)
Cardiology Office Note:    Date:  11/14/2018   ID:  Elizabeth Olsen, DOB April 27, 1941, MRN [id number]  PCP:  [name], MD  Cardiologist:  [name], MD    Referring MD: [name], MD   Chief Complaint  Patient presents with  . 1 month follow up  Doing well  History of Present Illness:    Elizabeth Olsen is a 78 y.o. female with diastolic congestive heart failure she did have cardiomyopathy with diminished ejection fraction however latest echocardiogram showed normalization of left ventricular ejection fraction the biggest problem is poorly controlled diabetes she perfectly understand what kind of problem she has however she cannot control herself and it and we talk about this she is laughing joking but the last hemoglobin A1c that I see is more than 12 I told her this is something that eventually will have very serious health consequences she seems to be understanding of that every single time we talk about this and adamantly should be able to do anything about it.  Denies have any cardiac issues no shortness of breath chest pain tightness squeezing pressure burning chest  Past Medical History:  Diagnosis Date  . Anemia   . Blood transfusion   . Blood transfusion without reported diagnosis    with breast surgery  . Breast cancer ([place]) 15 years ago   left   . CHF (congestive heart failure) ([place])   . 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 Diabetes mellitus    dx 1998.  was told prior to getting chemo that her bld sugar rose.  She thought it would go back  . Dyspnea    d/t anemia  . GERD (gastroesophageal reflux disease)   . Heart murmur    as child  . Hernia, incisional   . Hyperlipidemia   . Hypertension   . Hypothyroidism   . Vitamin D deficiency     Past Surgical History:  Procedure Laterality Date  . ABDOMINAL HYSTERECTOMY    . APPENDECTOMY    . BREAST SURGERY    . CHOLECYSTECTOMY  01/22/2018   LAPROSCOPIC   . CHOLECYSTECTOMY N/A 01/22/2018   Procedure:  LAPAROSCOPIC CHOLECYSTECTOMY WITH INTRAOPERATIVE CHOLANGIOGRAM;  Surgeon: Donnie Mesa, MD;  Location: [place];  Service: General;  Laterality: N/A;  . COLONOSCOPY  2013   due next 03-2017  . EYE SURGERY     bil cataracts  . IUD REMOVAL     with appendectomy  . MASTECTOMY, MODIFIED RADICAL W/RECONSTRUCTION Left 15 years ago   10 nodes out  . TUMOR EXCISION Left    x 2, neck, head    Current Medications: Current Meds  Medication Sig  . atorvastatin (LIPITOR) 10 MG tablet TAKE 1 TABLET BY MOUTH EVERY DAY ONE TIME ONLY (Patient taking differently: Take 10 mg by mouth daily. )  . calcium carbonate (OSCAL) 1500 (600 Ca) MG TABS tablet Take 600 mg by mouth daily with breakfast.   . carvedilol (COREG) 6.25 MG tablet TAKE 1 TABLET BY MOUTH TWICE A DAY  . fenofibrate 160 MG tablet Take 160 mg by mouth daily.  . fish oil-omega-3 fatty acids 1000 MG capsule Take 1 g by mouth daily.   . furosemide (LASIX) 20 MG tablet Take 3 tablets (60 mg total) by mouth daily. (Patient taking differently: Take 40 mg by mouth daily. )  . Insulin Glargine (LANTUS SOLOSTAR) 100 UNIT/ML Solostar Pen Inject 8 Units into the skin 2 (two) times daily.   . insulin lispro (  HUMALOG KWIKPEN) 100 UNIT/ML KiwkPen Inject 5-20 Units into the skin 3 (three) times daily. Sliding Scale  . iron polysaccharides (NIFEREX) 150 MG capsule Take 150 mg by mouth 2 (two) times daily.   Marland Kitchen levothyroxine (SYNTHROID, LEVOTHROID) 112 MCG tablet Take 112 mcg by mouth See admin instructions. Take 1 tablet (112 mcg) by mouth on 6 days of the week; hold on Sundays.  Marland Kitchen losartan (COZAAR) 25 MG tablet Take 1 tablet (25 mg total) by mouth daily.  . Menthol-Methyl Salicylate (MUSCLE RUB) 10-15 % CREA Apply 1 application topically 4 (four) times daily as needed for muscle pain.   Glory Rosebush VERIO test strip USE TO TEST UP TO 10 TIMES D  . ranitidine (ZANTAC) 300 MG capsule Take 300 mg by mouth daily as needed for heartburn.   . vitamin C (ASCORBIC ACID)  500 MG tablet Take 500 mg by mouth daily.  . Vitamin D, Ergocalciferol, (DRISDOL) 50000 UNITS CAPS Take 50,000 Units by mouth 2 (two) times a week. Sundays & Wednesdays.     Allergies:   Erythromycin; Penicillins; Nyquil [pseudoeph-doxylamine-dm-apap]; and Sulfa antibiotics   Social History   Socioeconomic History  . Marital status: Married    Spouse name: Not on file  . Number of children: 3  . Years of education: Not on file  . Highest education level: Not on file  Occupational History  . Occupation: homemaker  Social Needs  . Financial resource strain: Not on file  . Food insecurity:    Worry: Not on file    Inability: Not on file  . Transportation needs:    Medical: Not on file    Non-medical: Not on file  Tobacco Use  . Smoking status: Never Smoker  . Smokeless tobacco: Never Used  Substance and Sexual Activity  . Alcohol use: No  . Drug use: No  . Sexual activity: Not on file  Lifestyle  . Physical activity:    Days per week: Not on file    Minutes per session: Not on file  . Stress: Not on file  Relationships  . Social connections:    Talks on phone: Not on file    Gets together: Not on file    Attends religious service: Not on file    Active member of club or organization: Not on file    Attends meetings of clubs or organizations: Not on file    Relationship status: Not on file  Other Topics Concern  . Not on file  Social History Narrative  . Not on file     Family History: The patient's family history includes Congestive Heart Failure in her father; Diabetes in an other family member; Heart attack in her brother; Hypertension in her father, maternal grandfather, and maternal grandmother; Peripheral vascular disease in her father; Stomach cancer in her maternal grandmother. There is no history of Colon cancer, Esophageal cancer, Pancreatic cancer, or Rectal cancer. ROS:   Please see the history of present illness.    All 14 point review of systems  negative except as described per history of present illness  EKGs/Labs/Other Studies Reviewed:      Recent Labs: 09/04/2018: ALT 14; BUN 42; Creatinine, Ser 1.23; Potassium 4.6; Sodium 133 10/29/2018: Hemoglobin 10.0; Platelet Count 298  Recent Lipid Panel    Component Value Date/Time   CHOL 184 07/23/2017 1538   TRIG 91 07/23/2017 1538   HDL 77 07/23/2017 1538   CHOLHDL 2.4 07/23/2017 1538   LDLCALC 89 07/23/2017 1538  Physical Exam:    VS:  BP 140/64   Pulse 74   Ht 5' 2.5" (1.588 m)   Wt 148 lb 6.4 oz (67.3 kg)   SpO2 98%   BMI 26.71 kg/m     Wt Readings from Last 3 Encounters:  11/14/18 148 lb 6.4 oz (67.3 kg)  09/11/18 151 lb (68.5 kg)  09/04/18 143 lb (64.9 kg)     GEN:  Well nourished, well developed in no acute distress HEENT: Normal NECK: No JVD; No carotid bruits LYMPHATICS: No lymphadenopathy CARDIAC: RRR, no murmurs, no rubs, no gallops RESPIRATORY:  Clear to auscultation without rales, wheezing or rhonchi  ABDOMEN: Soft, non-tender, non-distended MUSCULOSKELETAL:  No edema; No deformity  SKIN: Warm and dry LOWER EXTREMITIES: no swelling NEUROLOGIC:  Alert and oriented x 3 PSYCHIATRIC:  Normal affect   ASSESSMENT:    1. Essential hypertension   2. Dilated cardiomyopathy ([place])   3. Mixed hyperlipidemia    PLAN:    In order of problems listed above:  1. Essential hypertension blood pressure well controlled 2. History of dilated cardiomyopathy with normalization on appropriate medications 3. Dyslipidemia we will continue with statin 4. Poorly controlled diabetes obesity major issue long discussion about this as described above   Medication Adjustments/Labs and Tests Ordered: Current medicines are reviewed at length with the patient today.  Concerns regarding medicines are outlined above.  No orders of the defined types were placed in this encounter.  Medication changes: No orders of the defined types were placed in this  encounter.   Signed, Park Liter, MD, [facility] 11/14/2018 10:34 AM    [place]

## 2018-11-15 DIAGNOSIS — E113513 Type 2 diabetes mellitus with proliferative diabetic retinopathy with macular edema, bilateral: Secondary | ICD-10-CM | POA: Diagnosis not present

## 2018-11-15 DIAGNOSIS — E113511 Type 2 diabetes mellitus with proliferative diabetic retinopathy with macular edema, right eye: Secondary | ICD-10-CM | POA: Diagnosis not present

## 2018-11-15 DIAGNOSIS — E113512 Type 2 diabetes mellitus with proliferative diabetic retinopathy with macular edema, left eye: Secondary | ICD-10-CM | POA: Diagnosis not present

## 2018-11-22 ENCOUNTER — Inpatient Hospital Stay: Payer: Medicare Other | Attending: Hematology

## 2018-11-22 ENCOUNTER — Inpatient Hospital Stay: Payer: Medicare Other

## 2018-11-22 DIAGNOSIS — E1165 Type 2 diabetes mellitus with hyperglycemia: Secondary | ICD-10-CM | POA: Diagnosis present

## 2018-11-22 DIAGNOSIS — D638 Anemia in other chronic diseases classified elsewhere: Secondary | ICD-10-CM | POA: Diagnosis present

## 2018-11-22 LAB — CBC WITH DIFFERENTIAL (CANCER CENTER ONLY)
Abs Immature Granulocytes: 0.02 10*3/uL (ref 0.00–0.07)
BASOS PCT: 1 %
Basophils Absolute: 0.1 10*3/uL (ref 0.0–0.1)
EOS ABS: 0.2 10*3/uL (ref 0.0–0.5)
Eosinophils Relative: 3 %
HCT: 36.1 % (ref 36.0–46.0)
Hemoglobin: 11.3 g/dL — ABNORMAL LOW (ref 12.0–15.0)
IMMATURE GRANULOCYTES: 0 %
Lymphocytes Relative: 30 %
Lymphs Abs: 2.1 10*3/uL (ref 0.7–4.0)
MCH: 29 pg (ref 26.0–34.0)
MCHC: 31.3 g/dL (ref 30.0–36.0)
MCV: 92.8 fL (ref 80.0–100.0)
MONOS PCT: 12 %
Monocytes Absolute: 0.9 10*3/uL (ref 0.1–1.0)
Neutro Abs: 3.8 10*3/uL (ref 1.7–7.7)
Neutrophils Relative %: 54 %
PLATELETS: 265 10*3/uL (ref 150–400)
RBC: 3.89 MIL/uL (ref 3.87–5.11)
RDW: 13.2 % (ref 11.5–15.5)
WBC: 7 10*3/uL (ref 4.0–10.5)
nRBC: 0 % (ref 0.0–0.2)

## 2018-11-22 LAB — IRON AND TIBC
Iron: 65 ug/dL (ref 41–142)
SATURATION RATIOS: 25 % (ref 21–57)
TIBC: 258 ug/dL (ref 236–444)
UIBC: 193 ug/dL (ref 120–384)

## 2018-11-22 LAB — FERRITIN: FERRITIN: 166 ng/mL (ref 11–307)

## 2018-11-22 NOTE — Progress Notes (Signed)
Pt did not meet parameters for injection. Hgb 11.3. Lab results given to pt and advised to call [facility] with any concerns.

## 2018-11-25 ENCOUNTER — Telehealth: Payer: Self-pay | Admitting: *Deleted

## 2018-11-25 NOTE — Telephone Encounter (Signed)
Attempted call back to patient regarding recent lab work.. Unable to leave VM. Iron studies normal

## 2018-11-25 NOTE — Telephone Encounter (Signed)
-----   Message from Truitt Merle, MD sent at 11/22/2018  8:25 PM EST ----- Please let pt know her iron study was normal, anemia improved, no concerns, thanks   Truitt Merle  11/22/2018

## 2018-11-25 NOTE — Telephone Encounter (Signed)
Received vm message  from patient.  TCT patient and was able to leave vm message that her lab work was normal.. no concerns per Dr. Burr Medico @ this time.

## 2018-11-27 ENCOUNTER — Other Ambulatory Visit: Payer: Medicare Other

## 2018-11-27 ENCOUNTER — Ambulatory Visit: Payer: Medicare Other

## 2018-12-17 ENCOUNTER — Inpatient Hospital Stay: Payer: Medicare Other | Attending: Hematology

## 2018-12-17 ENCOUNTER — Inpatient Hospital Stay: Payer: Medicare Other

## 2018-12-17 ENCOUNTER — Telehealth: Payer: Self-pay | Admitting: Hematology

## 2018-12-17 VITALS — BP 181/73 | HR 83 | Temp 98.1°F | Resp 18

## 2018-12-17 DIAGNOSIS — D638 Anemia in other chronic diseases classified elsewhere: Secondary | ICD-10-CM

## 2018-12-17 DIAGNOSIS — N183 Chronic kidney disease, stage 3 (moderate): Secondary | ICD-10-CM | POA: Insufficient documentation

## 2018-12-17 LAB — CBC WITH DIFFERENTIAL (CANCER CENTER ONLY)
Abs Immature Granulocytes: 0.02 10*3/uL (ref 0.00–0.07)
BASOS ABS: 0 10*3/uL (ref 0.0–0.1)
BASOS PCT: 0 %
EOS ABS: 0.3 10*3/uL (ref 0.0–0.5)
EOS PCT: 4 %
HCT: 30.5 % — ABNORMAL LOW (ref 36.0–46.0)
Hemoglobin: 9.8 g/dL — ABNORMAL LOW (ref 12.0–15.0)
IMMATURE GRANULOCYTES: 0 %
Lymphocytes Relative: 24 %
Lymphs Abs: 1.6 10*3/uL (ref 0.7–4.0)
MCH: 28.8 pg (ref 26.0–34.0)
MCHC: 32.1 g/dL (ref 30.0–36.0)
MCV: 89.7 fL (ref 80.0–100.0)
Monocytes Absolute: 0.8 10*3/uL (ref 0.1–1.0)
Monocytes Relative: 12 %
NEUTROS PCT: 60 %
NRBC: 0 % (ref 0.0–0.2)
Neutro Abs: 4.2 10*3/uL (ref 1.7–7.7)
PLATELETS: 278 10*3/uL (ref 150–400)
RBC: 3.4 MIL/uL — AB (ref 3.87–5.11)
RDW: 12.7 % (ref 11.5–15.5)
WBC: 6.9 10*3/uL (ref 4.0–10.5)

## 2018-12-17 LAB — IRON AND TIBC
Iron: 55 ug/dL (ref 41–142)
SATURATION RATIOS: 24 % (ref 21–57)
TIBC: 228 ug/dL — AB (ref 236–444)
UIBC: 173 ug/dL (ref 120–384)

## 2018-12-17 LAB — FERRITIN: Ferritin: 220 ng/mL (ref 11–307)

## 2018-12-17 MED ORDER — DARBEPOETIN ALFA 100 MCG/0.5ML IJ SOSY
100.0000 ug | PREFILLED_SYRINGE | Freq: Once | INTRAMUSCULAR | Status: AC
  Administered 2018-12-17: 100 ug via SUBCUTANEOUS
  Filled 2018-12-17: qty 0.5

## 2018-12-17 NOTE — Telephone Encounter (Signed)
Patient walk-in, already here with husband today, wanted to reschedule 02/26 appt for today, ok per charge nurse

## 2018-12-17 NOTE — Patient Instructions (Signed)

## 2018-12-17 NOTE — Progress Notes (Signed)
Per Dr. Burr Medico ok to give Aranesp 168mcg with BP 181/73

## 2018-12-18 ENCOUNTER — Telehealth: Payer: Self-pay

## 2018-12-18 NOTE — Telephone Encounter (Signed)
Left voice message for this patient regarding lab results, per Dr. Burr Medico iron study was normal yesterday, please f/u with your PCP for better BP control.  Encouraged patient to call back if she has any questions.

## 2018-12-18 NOTE — Telephone Encounter (Signed)
-----   Message from Truitt Merle, MD sent at 12/18/2018  6:43 AM EST ----- Please let pt know her iron study was normal yesterday, she needs to f/u with PCP for better BP control, thanks   Truitt Merle  12/18/2018

## 2018-12-25 ENCOUNTER — Other Ambulatory Visit: Payer: Medicare Other

## 2018-12-25 ENCOUNTER — Ambulatory Visit: Payer: Medicare Other

## 2019-01-03 ENCOUNTER — Other Ambulatory Visit: Payer: Self-pay | Admitting: Cardiology

## 2019-01-03 ENCOUNTER — Other Ambulatory Visit (HOSPITAL_COMMUNITY): Payer: Self-pay | Admitting: Cardiology

## 2019-01-07 ENCOUNTER — Telehealth: Payer: Self-pay | Admitting: Cardiology

## 2019-01-07 ENCOUNTER — Other Ambulatory Visit: Payer: Self-pay | Admitting: Cardiology

## 2019-01-07 DIAGNOSIS — I10 Essential (primary) hypertension: Secondary | ICD-10-CM

## 2019-01-07 NOTE — Telephone Encounter (Signed)
Patient states she went to pick up her furosemide prescription and it says two tablets twice a day and she thought she was supposed to take three tablets twice a day. Please advise.

## 2019-01-07 NOTE — Telephone Encounter (Signed)
3 tabl BID, check chem7 in 1 week

## 2019-01-08 ENCOUNTER — Ambulatory Visit: Payer: Medicare Other | Admitting: Cardiology

## 2019-01-08 IMAGING — US US ABDOMEN LIMITED
1 series · 14 of 25 positions shown · non-contrast
Comparison: CT 07/23/2014

CLINICAL DATA: Abdominal pain with nausea and diarrhea

EXAM:
ULTRASOUND ABDOMEN LIMITED RIGHT UPPER QUADRANT

[Series 1: us abdomen limited · 0.19mm/px · 14 of 102 slices shown]
[im 1/102]
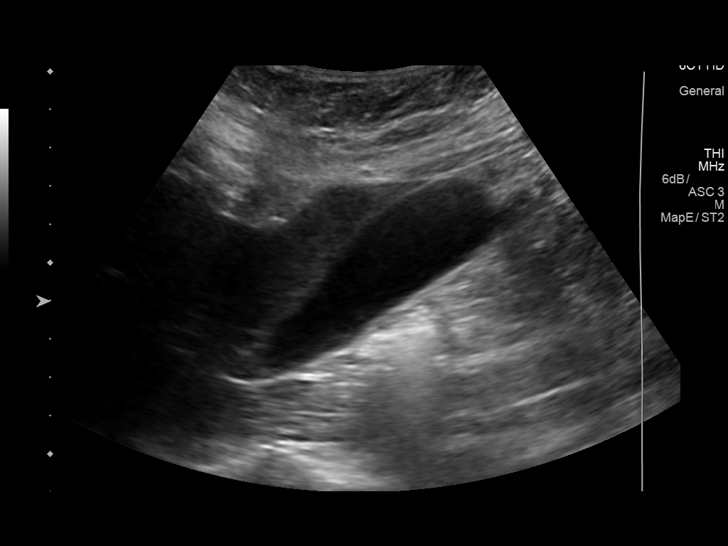
[im 9/102]
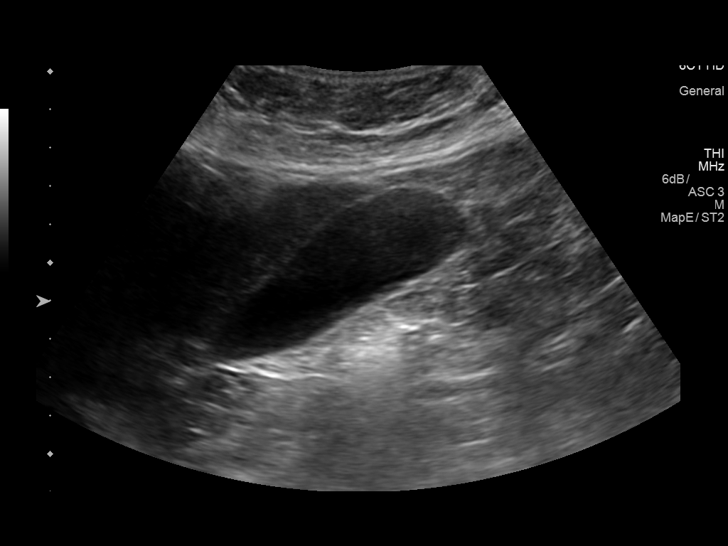
[im 17/102]
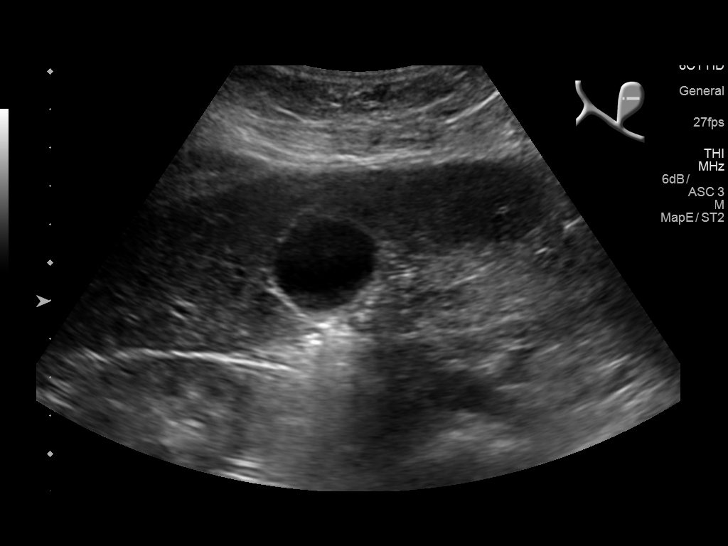
[im 26/102]
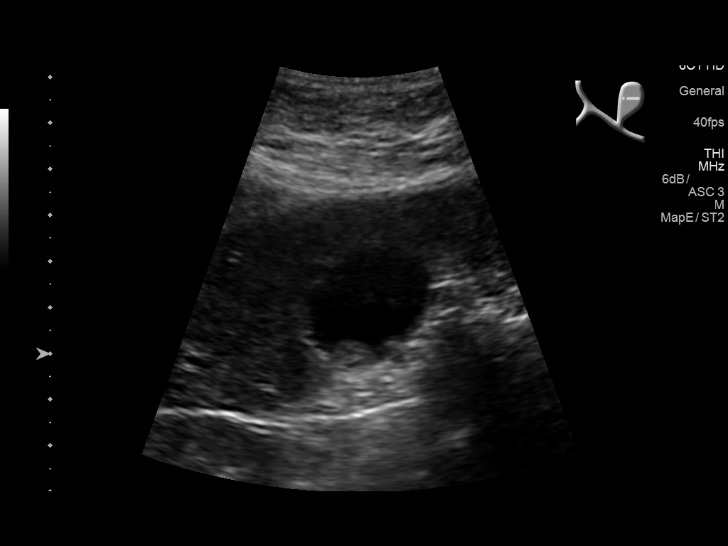
[im 34/102]
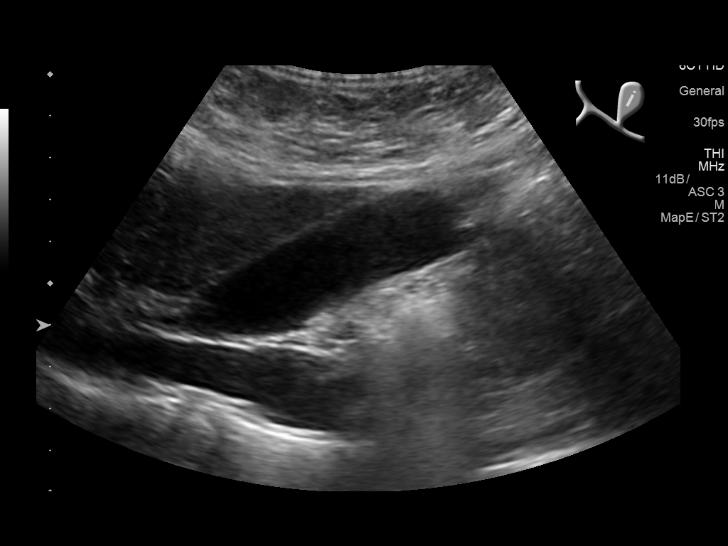
[im 38/102]
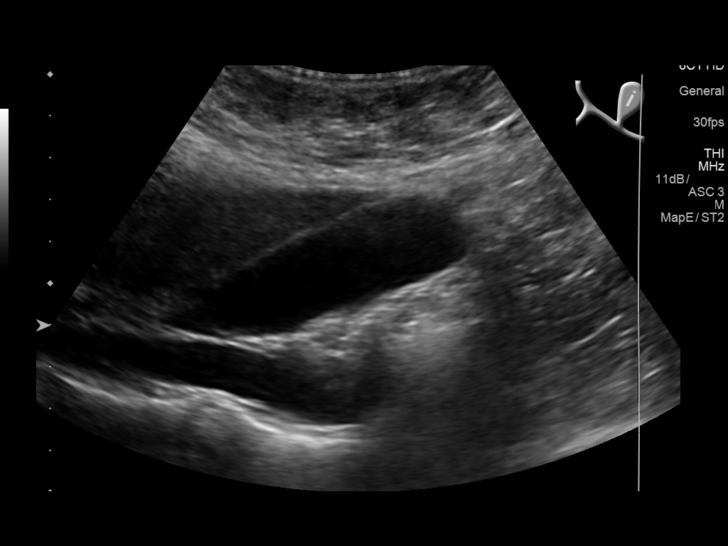
[im 47/102]
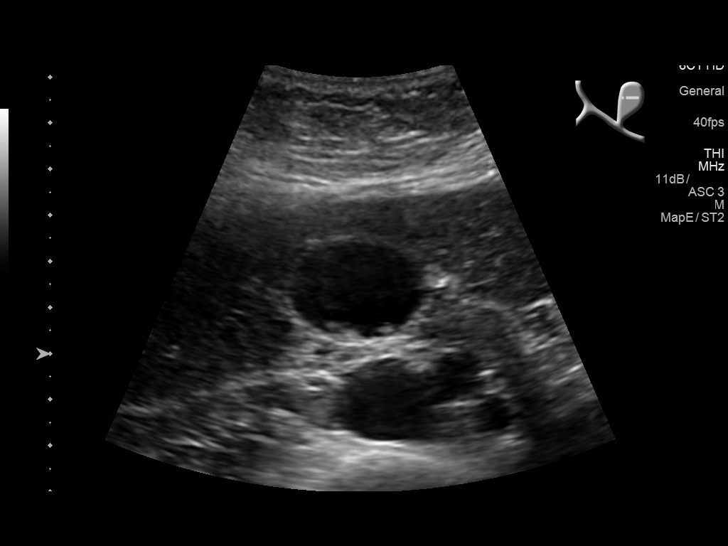
[im 55/102]
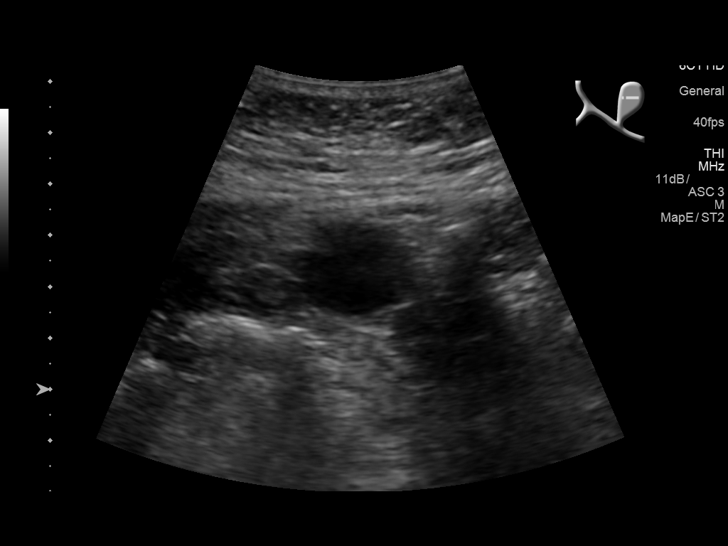
[im 64/102]
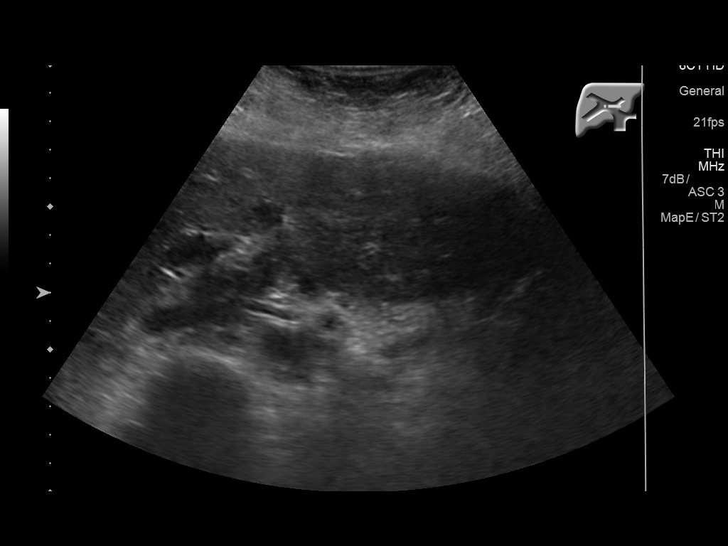
[im 68/102]
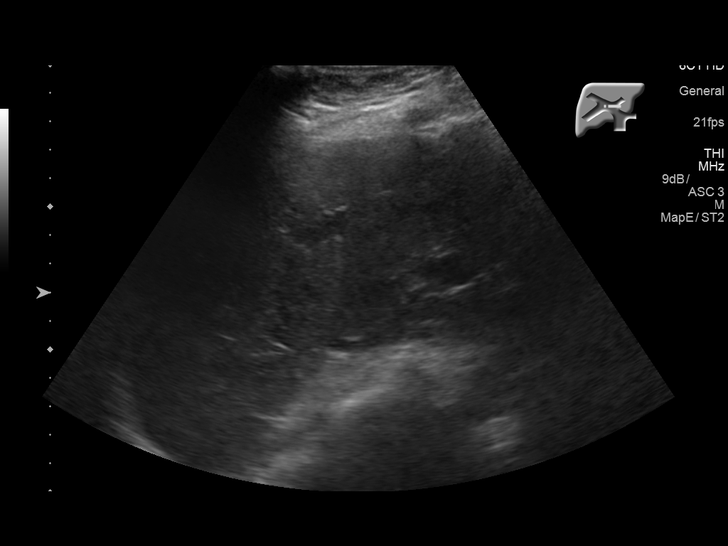
[im 76/102]
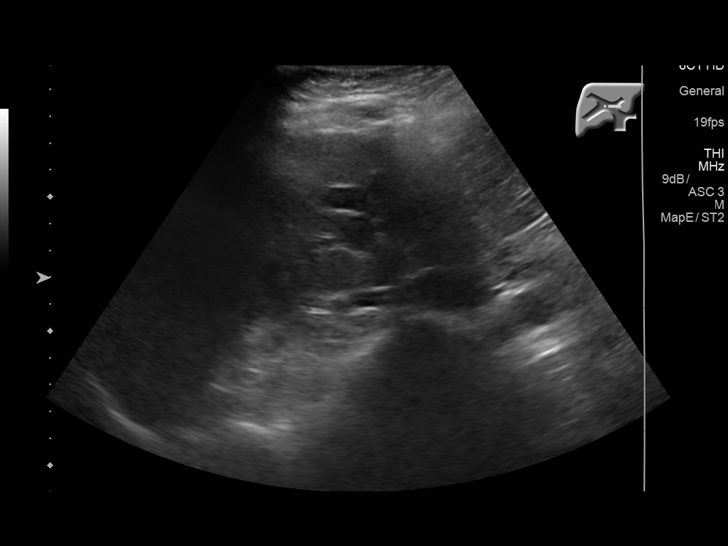
[im 85/102]
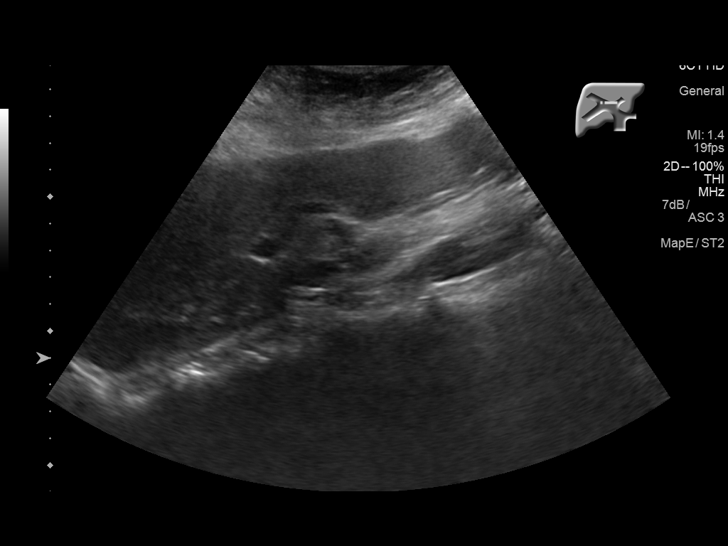
[im 93/102]
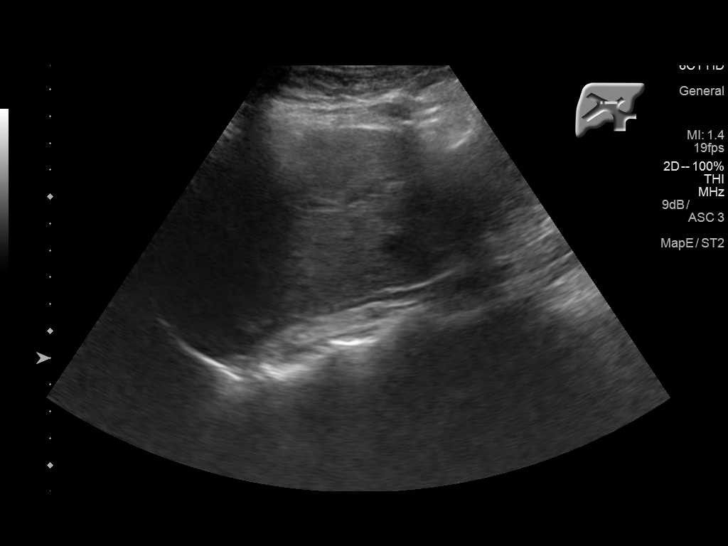
[im 102/102]
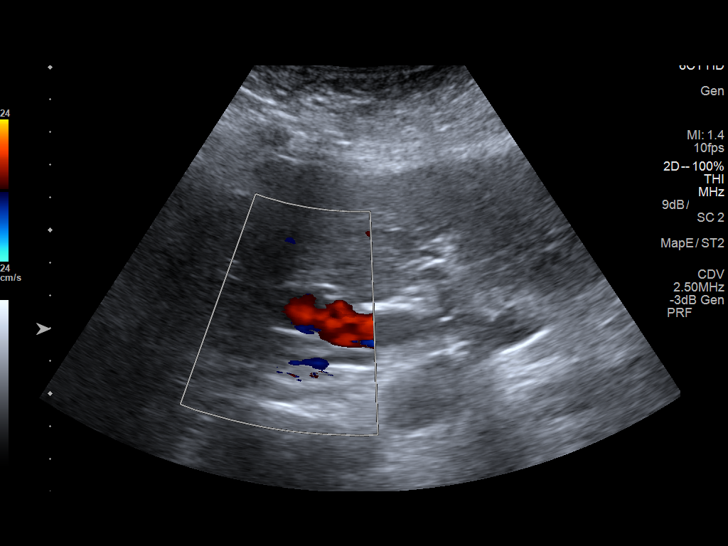

[14 of 25 positions shown; findings below may reference images not displayed]

FINDINGS: Gallbladder:

Tumefactive sludge and/or nonshadowing stones are present. Negative
sonographic Murphy. Normal wall thickness.

Common bile duct:

Diameter: 2.5 mm

Liver:

No focal lesion identified. Within normal limits in parenchymal
echogenicity. Portal vein is patent on color Doppler imaging with
normal direction of blood flow towards the liver.
IMPRESSION: 1. Tumefactive sludge and/or nonshadowing small stones in the
gallbladder. Negative for acute cholecystitis or biliary dilatation

## 2019-01-08 NOTE — Telephone Encounter (Signed)
Left message for patient to return call.

## 2019-01-09 NOTE — Telephone Encounter (Signed)
Patient informed to take lasix 20 mg 3 tablets twice daily. She was also informed to have labs drawn she reports she will come next week

## 2019-01-14 ENCOUNTER — Other Ambulatory Visit: Payer: Self-pay

## 2019-01-14 ENCOUNTER — Inpatient Hospital Stay: Payer: Medicare Other

## 2019-01-14 ENCOUNTER — Inpatient Hospital Stay: Payer: Medicare Other | Attending: Hematology

## 2019-01-14 VITALS — BP 148/72

## 2019-01-14 DIAGNOSIS — D638 Anemia in other chronic diseases classified elsewhere: Secondary | ICD-10-CM

## 2019-01-14 DIAGNOSIS — N183 Chronic kidney disease, stage 3 (moderate): Secondary | ICD-10-CM | POA: Diagnosis present

## 2019-01-14 LAB — CBC WITH DIFFERENTIAL (CANCER CENTER ONLY)
Abs Immature Granulocytes: 0.02 10*3/uL (ref 0.00–0.07)
BASOS ABS: 0 10*3/uL (ref 0.0–0.1)
Basophils Relative: 0 %
EOS PCT: 4 %
Eosinophils Absolute: 0.3 10*3/uL (ref 0.0–0.5)
HCT: 32.2 % — ABNORMAL LOW (ref 36.0–46.0)
HEMOGLOBIN: 10 g/dL — AB (ref 12.0–15.0)
Immature Granulocytes: 0 %
LYMPHS PCT: 26 %
Lymphs Abs: 2 10*3/uL (ref 0.7–4.0)
MCH: 28.5 pg (ref 26.0–34.0)
MCHC: 31.1 g/dL (ref 30.0–36.0)
MCV: 91.7 fL (ref 80.0–100.0)
Monocytes Absolute: 0.9 10*3/uL (ref 0.1–1.0)
Monocytes Relative: 12 %
NEUTROS ABS: 4.5 10*3/uL (ref 1.7–7.7)
NRBC: 0 % (ref 0.0–0.2)
Neutrophils Relative %: 58 %
Platelet Count: 345 10*3/uL (ref 150–400)
RBC: 3.51 MIL/uL — AB (ref 3.87–5.11)
RDW: 13.8 % (ref 11.5–15.5)
WBC: 7.7 10*3/uL (ref 4.0–10.5)

## 2019-01-14 LAB — IRON AND TIBC
Iron: 78 ug/dL (ref 41–142)
SATURATION RATIOS: 32 % (ref 21–57)
TIBC: 246 ug/dL (ref 236–444)
UIBC: 168 ug/dL (ref 120–384)

## 2019-01-14 LAB — FERRITIN: Ferritin: 194 ng/mL (ref 11–307)

## 2019-01-14 MED ORDER — DARBEPOETIN ALFA 100 MCG/0.5ML IJ SOSY
100.0000 ug | PREFILLED_SYRINGE | Freq: Once | INTRAMUSCULAR | Status: AC
  Administered 2019-01-14: 100 ug via SUBCUTANEOUS
  Filled 2019-01-14: qty 0.5

## 2019-01-16 ENCOUNTER — Telehealth: Payer: Self-pay

## 2019-01-16 NOTE — Telephone Encounter (Signed)
Spoke with patient regarding lab results, per Dr. Burr Medico iron and Hemoglobin are stable, no concerns, patient verbalized an understanding.

## 2019-01-16 NOTE — Telephone Encounter (Signed)
-----   Message from Truitt Merle, MD sent at 01/16/2019 10:47 AM EDT ----- Please let pt know her iron stable is normal, Hg stable, no concerns, thanks   Truitt Merle  01/16/2019

## 2019-01-22 ENCOUNTER — Other Ambulatory Visit: Payer: Medicare Other

## 2019-01-22 ENCOUNTER — Ambulatory Visit: Payer: Medicare Other

## 2019-01-26 ENCOUNTER — Other Ambulatory Visit: Payer: Self-pay | Admitting: Cardiology

## 2019-01-30 ENCOUNTER — Telehealth: Payer: Self-pay | Admitting: Cardiology

## 2019-01-30 MED ORDER — CARVEDILOL 6.25 MG PO TABS: 6.2500 mg | ORAL_TABLET | Freq: Two times a day (BID) | ORAL | 1 refills | Status: DC

## 2019-01-30 NOTE — Addendum Note (Signed)
Addended by: Polly Cobia A on: 01/30/2019 02:17 PM   Modules accepted: Orders

## 2019-01-30 NOTE — Telephone Encounter (Signed)
°*  STAT* If patient is at the pharmacy, call can be transferred to refill team.   1. Which medications need to be refilled? (please list name of each medication and dose if known) Coreg 2. Which pharmacy/location (including street and city if local pharmacy) is medication to be sent to? [facility], [address] [place], [place] [address]  Ph [phone] Fax [phone] 3. Do they need a 30 day or 90 day supply? [place]

## 2019-02-04 DIAGNOSIS — H3520 Other non-diabetic proliferative retinopathy, unspecified eye: Secondary | ICD-10-CM | POA: Diagnosis not present

## 2019-02-04 DIAGNOSIS — C50919 Malignant neoplasm of unspecified site of unspecified female breast: Secondary | ICD-10-CM | POA: Diagnosis not present

## 2019-02-04 DIAGNOSIS — R809 Proteinuria, unspecified: Secondary | ICD-10-CM | POA: Diagnosis not present

## 2019-02-04 DIAGNOSIS — I129 Hypertensive chronic kidney disease with stage 1 through stage 4 chronic kidney disease, or unspecified chronic kidney disease: Secondary | ICD-10-CM | POA: Diagnosis not present

## 2019-02-04 DIAGNOSIS — D649 Anemia, unspecified: Secondary | ICD-10-CM | POA: Diagnosis not present

## 2019-02-04 DIAGNOSIS — E1149 Type 2 diabetes mellitus with other diabetic neurological complication: Secondary | ICD-10-CM | POA: Diagnosis not present

## 2019-02-04 DIAGNOSIS — N183 Chronic kidney disease, stage 3 (moderate): Secondary | ICD-10-CM | POA: Diagnosis not present

## 2019-02-04 DIAGNOSIS — Z794 Long term (current) use of insulin: Secondary | ICD-10-CM | POA: Diagnosis not present

## 2019-02-04 DIAGNOSIS — D126 Benign neoplasm of colon, unspecified: Secondary | ICD-10-CM | POA: Diagnosis not present

## 2019-02-04 DIAGNOSIS — E559 Vitamin D deficiency, unspecified: Secondary | ICD-10-CM | POA: Diagnosis not present

## 2019-02-04 DIAGNOSIS — E1142 Type 2 diabetes mellitus with diabetic polyneuropathy: Secondary | ICD-10-CM | POA: Diagnosis not present

## 2019-02-04 DIAGNOSIS — I420 Dilated cardiomyopathy: Secondary | ICD-10-CM | POA: Diagnosis not present

## 2019-02-12 ENCOUNTER — Telehealth: Payer: Self-pay | Admitting: Cardiology

## 2019-02-12 NOTE — Telephone Encounter (Signed)
Virtual Visit Pre-Appointment Phone Call  Steps For Call:  1. Confirm consent - "In the setting of the current Covid19 crisis, you are scheduled for a (phone or video) visit with your provider on (date) at (time).  Just as we do with many in-office visits, in order for you to participate in this visit, we must obtain consent.  If you'd like, I can send this to your mychart (if signed up) or email for you to review.  Otherwise, I can obtain your verbal consent now.  All virtual visits are billed to your insurance company just like a normal visit would be.  By agreeing to a virtual visit, we'd like you to understand that the technology does not allow for your provider to perform an examination, and thus may limit your provider's ability to fully assess your condition.  Finally, though the technology is pretty good, we cannot assure that it will always work on either your or our end, and in the setting of a video visit, we may have to convert it to a phone-only visit.  In either situation, we cannot ensure that we have a secure connection.  Are you willing to proceed?" STAFF: Did the patient verbally acknowledge consent to telehealth visit? Document YES/NO here: YES  2. Confirm the BEST phone number to call the day of the visit by including in appointment notes  3. Give patient instructions for WebEx/MyChart download to smartphone as below or Doximity/Doxy.me if video visit (depending on what platform provider is using)  4. Advise patient to be prepared with their blood pressure, heart rate, weight, any heart rhythm information, their current medicines, and a piece of paper and pen handy for any instructions they may receive the day of their visit  5. Inform patient they will receive a phone call 15 minutes prior to their appointment time (may be from unknown caller ID) so they should be prepared to answer  6. Confirm that appointment type is correct in Epic appointment notes (VIDEO vs  PHONE)     TELEPHONE CALL NOTE  Elizabeth Olsen has been deemed a candidate for a follow-up tele-health visit to limit community exposure during the Covid-19 pandemic. I spoke with the patient via phone to ensure availability of phone/video source, confirm preferred email & phone number, and discuss instructions and expectations.  I reminded CHARIKA MIKELSON to be prepared with any vital sign and/or heart rhythm information that could potentially be obtained via home monitoring, at the time of her visit. I reminded PAYSLEE BATESON to expect a phone call at the time of her visit if her visit.  Isaiah Blakes 02/12/2019 9:45 AM   INSTRUCTIONS FOR DOWNLOADING THE WEBEX APP TO SMARTPHONE  - If Apple, ask patient to go to App Store and type in WebEx in the search bar. [employer], the blue/green circle. If Android, go to [employer] and type in [employer] in the search bar. The app is free but as with any other app downloads, their phone may require them to verify saved payment information or Apple/Android password.  - The patient does NOT have to create an account. - On the day of the visit, the assist will walk the patient through joining the meeting with the meeting number/password.  INSTRUCTIONS FOR DOWNLOADING THE MYCHART APP TO SMARTPHONE  - The patient must first make sure to have activated MyChart and know their login information - If Apple, go to [employer] and type in  MyChart in the search bar and download the app. If Android, ask patient to go to [employer] and type in [place] in the search bar and download the app. The app is free but as with any other app downloads, their phone may require them to verify saved payment information or Apple/Android password.  - The patient will need to then log into the app with their MyChart username and password, and select [facility] as their healthcare provider to link the account. When it is time for your visit, go to the  MyChart app, find appointments, and click Begin Video Visit. Be sure to Select Allow for your device to access the Microphone and Camera for your visit. You will then be connected, and your provider will be with you shortly.  **If they have any issues connecting, or need assistance please contact MyChart service desk [phone])**  **If using a computer, in order to ensure the best quality for their visit they will need to use either of the following Internet Browsers: [employer], or Google Chrome**  IF USING DOXIMITY or DOXY.ME - The patient will receive a link just prior to their visit, either by text or email (to be determined day of appointment depending on if it's doxy.me or Doximity).     FULL LENGTH CONSENT FOR TELE-HEALTH VISIT   I hereby voluntarily request, consent and authorize [facility] and its employed or contracted physicians, physician assistants, nurse practitioners or other licensed health care professionals (the Practitioner), to provide me with telemedicine health care services (the Services") as deemed necessary by the treating Practitioner. I acknowledge and consent to receive the Services by the Practitioner via telemedicine. I understand that the telemedicine visit will involve communicating with the Practitioner through live audiovisual communication technology and the disclosure of certain medical information by electronic transmission. I acknowledge that I have been given the opportunity to request an in-person assessment or other available alternative prior to the telemedicine visit and am voluntarily participating in the telemedicine visit.  I understand that I have the right to withhold or withdraw my consent to the use of telemedicine in the course of my care at any time, without affecting my right to future care or treatment, and that the Practitioner or I may terminate the telemedicine visit at any time. I understand that I have the right to  inspect all information obtained and/or recorded in the course of the telemedicine visit and may receive copies of available information for a reasonable fee.  I understand that some of the potential risks of receiving the Services via telemedicine include:   Delay or interruption in medical evaluation due to technological equipment failure or disruption;  Information transmitted may not be sufficient (e.g. poor resolution of images) to allow for appropriate medical decision making by the Practitioner; and/or   In rare instances, security protocols could fail, causing a breach of personal health information.  Furthermore, I acknowledge that it is my responsibility to provide information about my medical history, conditions and care that is complete and accurate to the best of my ability. I acknowledge that Practitioner's advice, recommendations, and/or decision may be based on factors not within their control, such as incomplete or inaccurate data provided by me or distortions of diagnostic images or specimens that may result from electronic transmissions. I understand that the practice of medicine is not an exact science and that Practitioner makes no warranties or guarantees regarding treatment outcomes. I acknowledge that I will receive a copy  of this consent concurrently upon execution via email to the email address I last provided but may also request a printed copy by calling the office of [facility].    I understand that my insurance will be billed for this visit.   I have read or had this consent read to me.  I understand the contents of this consent, which adequately explains the benefits and risks of the Services being provided via telemedicine.   I have been provided ample opportunity to ask questions regarding this consent and the Services and have had my questions answered to my satisfaction.  I give my informed consent for the services to be provided through the use of  telemedicine in my medical care  By participating in this telemedicine visit I agree to the above.

## 2019-02-17 ENCOUNTER — Encounter: Payer: Self-pay | Admitting: Cardiology

## 2019-02-17 ENCOUNTER — Other Ambulatory Visit: Payer: Self-pay

## 2019-02-17 ENCOUNTER — Telehealth: Payer: Self-pay | Admitting: Cardiology

## 2019-02-17 ENCOUNTER — Telehealth (INDEPENDENT_AMBULATORY_CARE_PROVIDER_SITE_OTHER): Payer: Medicare Other | Admitting: Cardiology

## 2019-02-17 VITALS — BP 129/61 | HR 77 | Wt 146.7 lb

## 2019-02-17 DIAGNOSIS — I10 Essential (primary) hypertension: Secondary | ICD-10-CM

## 2019-02-17 DIAGNOSIS — I420 Dilated cardiomyopathy: Secondary | ICD-10-CM

## 2019-02-17 DIAGNOSIS — Z794 Long term (current) use of insulin: Secondary | ICD-10-CM

## 2019-02-17 DIAGNOSIS — D638 Anemia in other chronic diseases classified elsewhere: Secondary | ICD-10-CM

## 2019-02-17 DIAGNOSIS — E1139 Type 2 diabetes mellitus with other diabetic ophthalmic complication: Secondary | ICD-10-CM

## 2019-02-17 DIAGNOSIS — E1169 Type 2 diabetes mellitus with other specified complication: Secondary | ICD-10-CM

## 2019-02-17 NOTE — Progress Notes (Signed)
Virtual Visit via Telephone Note   This visit type was conducted due to national recommendations for restrictions regarding the COVID-19 Pandemic (e.g. social distancing) in an effort to limit this patient's exposure and mitigate transmission in our community.  Due to her co-morbid illnesses, this patient is at least at moderate risk for complications without adequate follow up.  This format is felt to be most appropriate for this patient at this time.  The patient did not have access to video technology/had technical difficulties with video requiring transitioning to audio format only (telephone).  All issues noted in this document were discussed and addressed.  No physical exam could be performed with this format.  Please refer to the patient's chart for her  consent to telehealth for Elizabeth Olsen.  Evaluation Performed:  Follow-up visit  This visit type was conducted due to national recommendations for restrictions regarding the COVID-19 Pandemic (e.g. social distancing).  This format is felt to be most appropriate for this patient at this time.  All issues noted in this document were discussed and addressed.  No physical exam was performed (except for noted visual exam findings with Video Visits).  Please refer to the patient's chart (MyChart message for video visits and phone note for telephone visits) for the patient's consent to telehealth for Elizabeth Olsen.  Date:  02/17/2019  ID: Elizabeth Olsen, DOB 02/27/1941, MRN [id number]   Patient Location: [facility] O[place] [id number]   Provider location:   [facility]  PCP:  Elizabeth Dus, MD  Cardiologist:  Elizabeth Campus, MD     Chief Complaint: Doing well  History of Present Illness:    [name] is a 78 y.o. female  who presents via audio/video conferencing for a telehealth visit today.  With hypertension, which is under control, diabetes which is poorly controlled, history of cardiomyopathy likely  with normalization.  I am talking to her over the phone.  We are trying to establish video link but she in spite of the fact she got smart phone she was not able to do it.  Overall she is doing well she is staying at home her daughter is stuck in [place] and she is currently in over there.  Apparently tomorrow she is going to come back.  Patient is doing well there is some swelling of lower extremities only at any time when she stands all day cooking apparently her sugar is about 1 40-1 50.  She denies having a chest pain tightness squeezing pressure burning in the chest.   The patient does not have symptoms concerning for COVID-19 infection (fever, chills, cough, or new SHORTNESS OF BREATH).    Prior CV studies:   The following studies were reviewed today:       Past Medical History:  Diagnosis Date  . Anemia   . Blood transfusion   . Blood transfusion without reported diagnosis    with breast surgery  . Breast cancer ([place]) 15 years ago   left   . CHF (congestive heart failure) ([place])   . 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 Diabetes mellitus    dx 1998.  was told prior to getting chemo that her bld sugar rose.  She thought it would go back  . Dyspnea    d/t anemia  . GERD (gastroesophageal reflux disease)   . Heart murmur    as child  . Hernia, incisional   . Hyperlipidemia   .  Hypertension   . Hypothyroidism   . Vitamin D deficiency     Past Surgical History:  Procedure Laterality Date  . ABDOMINAL HYSTERECTOMY    . APPENDECTOMY    . BREAST SURGERY    . CHOLECYSTECTOMY  01/22/2018   LAPROSCOPIC   . CHOLECYSTECTOMY N/A 01/22/2018   Procedure: LAPAROSCOPIC CHOLECYSTECTOMY WITH INTRAOPERATIVE CHOLANGIOGRAM;  Surgeon: Donnie Mesa, MD;  Location: [facility];  Service: General;  Laterality: N/A;  . COLONOSCOPY  2013   due next 03-2017  . EYE SURGERY     bil cataracts  . IUD REMOVAL     with appendectomy  . MASTECTOMY, MODIFIED RADICAL W/RECONSTRUCTION Left 15 years  ago   10 nodes out  . TUMOR EXCISION Left    x 2, neck, head     Current Meds  Medication Sig  . atorvastatin (LIPITOR) 10 MG tablet TAKE 1 TABLET BY MOUTH EVERY DAY ONE TIME ONLY (Patient taking differently: Take 10 mg by mouth daily. )  . calcium carbonate (OSCAL) 1500 (600 Ca) MG TABS tablet Take 600 mg by mouth daily with breakfast.   . carvedilol (COREG) 6.25 MG tablet Take 1 tablet (6.25 mg total) by mouth 2 (two) times daily.  . fenofibrate 160 MG tablet Take 160 mg by mouth daily.  . fish oil-omega-3 fatty acids 1000 MG capsule Take 1 g by mouth daily.   . furosemide (LASIX) 20 MG tablet Take 2 tablets (40 mg total) by mouth daily.  . Insulin Glargine (LANTUS SOLOSTAR) 100 UNIT/ML Solostar Pen Inject 8 Units into the skin 2 (two) times daily.   . insulin lispro (HUMALOG KWIKPEN) 100 UNIT/ML KiwkPen Inject 5-20 Units into the skin 3 (three) times daily. Sliding Scale  . iron polysaccharides (NIFEREX) 150 MG capsule Take 150 mg by mouth 2 (two) times daily.   Marland Kitchen levothyroxine (SYNTHROID, LEVOTHROID) 112 MCG tablet Take 112 mcg by mouth See admin instructions. Take 1 tablet (112 mcg) by mouth on 6 days of the week; hold on Sundays.  Marland Kitchen losartan (COZAAR) 25 MG tablet Take 1 tablet (25 mg total) by mouth daily.  . Menthol-Methyl Salicylate (MUSCLE RUB) 10-15 % CREA Apply 1 application topically 4 (four) times daily as needed for muscle pain.   Glory Rosebush VERIO test strip USE TO TEST UP TO 10 TIMES D  . vitamin C (ASCORBIC ACID) 500 MG tablet Take 500 mg by mouth daily.  . Vitamin D, Ergocalciferol, (DRISDOL) 50000 UNITS CAPS Take 50,000 Units by mouth 2 (two) times a week. Sundays & Wednesdays.      Family History: The patient's family history includes Congestive Heart Failure in her father; Diabetes in an other family member; Heart attack in her brother; Hypertension in her father, maternal grandfather, and maternal grandmother; Peripheral vascular disease in her father; Stomach cancer  in her maternal grandmother. There is no history of Colon cancer, Esophageal cancer, Pancreatic cancer, or Rectal cancer.   ROS:   Please see the history of present illness.     All other systems reviewed and are negative.   Labs/Other Tests and Data Reviewed:     Recent Labs: 09/04/2018: ALT 14; BUN 42; Creatinine, Ser 1.23; Potassium 4.6; Sodium 133 01/14/2019: Hemoglobin 10.0; Platelet Count 345  Recent Lipid Panel    Component Value Date/Time   CHOL 184 07/23/2017 1538   TRIG 91 07/23/2017 1538   HDL 77 07/23/2017 1538   CHOLHDL 2.4 07/23/2017 1538   LDLCALC 89 07/23/2017 1538      Exam:  Vital Signs:  BP 129/61   Pulse 77   Wt 146 lb 11.2 oz (66.5 kg)   BMI 26.40 kg/m     Wt Readings from Last 3 Encounters:  02/17/19 146 lb 11.2 oz (66.5 kg)  11/14/18 148 lb 6.4 oz (67.3 kg)  09/11/18 151 lb (68.5 kg)     Well nourished, well developed in no acute distress. Alert awake and is done 53M talking to her over the phone.  She was unable to establish video link.  Diagnosis for this visit:   1. Dilated cardiomyopathy ([facility] [facility])   2. Essential hypertension   3. Type 2 diabetes mellitus with other ophthalmic complication, with long-term current use of insulin ([place])   4. Anemia associated with diabetes mellitus ([place])      ASSESSMENT & PLAN:    1.  Dilated cardiomyopathy over left ventricular ejection fraction was normal based on echocardiogram from December 2019.  We will continue present conservative approach. 2.  Essential hypertension her blood pressure today was 120/73.  I will advised her to continue present management. 3.  Diabetes mellitus which is always a problem poorly controlled.  However she tells me that her sugars 100 4250 right now.  We will continue present management. 4.  Anemia doing well.  Followed by oncology team  COVID-19 Education: The signs and symptoms of COVID-19 were discussed with the patient and how to seek care for testing (follow up with  PCP or arrange E-visit).  The importance of social distancing was discussed today.  Patient Risk:   After full review of this patients clinical status, I feel that they are at least moderate risk at this time.  Time:   Today, I have spent 15 minutes with the patient with telehealth technology discussing pt health issues.  I spent 45minutes reviewing her chart before the visit.  Visit was finished at 10:38.    Medication Adjustments/Labs and Tests Ordered: Current medicines are reviewed at length with the patient today.  Concerns regarding medicines are outlined above.  No orders of the defined types were placed in this encounter.  Medication changes: No orders of the defined types were placed in this encounter.    Disposition: Follow-up in 4 months.  Signed, Park Liter, MD, [facility] 02/17/2019 10:38 AM    [place]

## 2019-02-17 NOTE — Telephone Encounter (Signed)

## 2019-02-17 NOTE — Patient Instructions (Signed)
Medication Instructions:  Your physician recommends that you continue on your current medications as directed. Please refer to the Current Medication list given to you today.  If you need a refill on your cardiac medications before your next appointment, please call your pharmacy.   Lab work: None If you have labs (blood work) drawn today and your tests are completely normal, you will receive your results only by: Marland Kitchen MyChart Message (if you have MyChart) OR . A paper copy in the mail If you have any lab test that is abnormal or we need to change your treatment, we will call you to review the results.  Testing/Procedures: None  Follow-Up: At [facility], you and your health needs are our priority.  As part of our continuing mission to provide you with exceptional heart care, we have created designated Provider Care Teams.  These Care Teams include your primary Cardiologist (physician) and Advanced Practice Providers (APPs -  Physician Assistants and Nurse Practitioners) who all work together to provide you with the care you need, when you need it. You will need a follow up appointment in 4 months.  Any Other Special Instructions Will Be Listed Below (If Applicable).

## 2019-02-18 ENCOUNTER — Telehealth: Payer: Self-pay | Admitting: Cardiology

## 2019-02-18 NOTE — Telephone Encounter (Signed)
°*  STAT* If patient is at the pharmacy, call can be transferred to refill team.   1. Which medications need to be refilled? (please list name of each medication and dose if known) losartan (COZAAR) 25 MG tablet   2. Which pharmacy/location (including street and city if local pharmacy) is medication to be sent to?   [facility] - [place], [place] - [address] [phone] (Phone) [phone] (Fax)     3. Do they need a 30 day or 90 day supply? 90 day

## 2019-02-19 ENCOUNTER — Other Ambulatory Visit: Payer: Medicare Other

## 2019-02-19 ENCOUNTER — Ambulatory Visit: Payer: Medicare Other

## 2019-02-19 MED ORDER — LOSARTAN POTASSIUM 25 MG PO TABS: 25.0000 mg | ORAL_TABLET | Freq: Every day | ORAL | 3 refills | Status: DC

## 2019-02-19 NOTE — Telephone Encounter (Signed)
Cozaar refill sent to [facility] in [place] per pt preference

## 2019-02-26 ENCOUNTER — Other Ambulatory Visit: Payer: Self-pay | Admitting: Cardiology

## 2019-02-27 NOTE — Telephone Encounter (Signed)
Rx refill sent to pharmacy. 

## 2019-02-28 ENCOUNTER — Telehealth: Payer: Self-pay

## 2019-02-28 NOTE — Telephone Encounter (Signed)
Patient calls stating she wants appointment for lab work.  She is feeling extremely tired, wanting to sleep a lot, scheduled her for Monday 5/4.

## 2019-03-03 ENCOUNTER — Telehealth: Payer: Self-pay | Admitting: Nurse Practitioner

## 2019-03-03 ENCOUNTER — Other Ambulatory Visit: Payer: Self-pay

## 2019-03-03 ENCOUNTER — Inpatient Hospital Stay: Payer: Medicare Other | Attending: Hematology

## 2019-03-03 DIAGNOSIS — E785 Hyperlipidemia, unspecified: Secondary | ICD-10-CM | POA: Diagnosis not present

## 2019-03-03 DIAGNOSIS — E1122 Type 2 diabetes mellitus with diabetic chronic kidney disease: Secondary | ICD-10-CM | POA: Insufficient documentation

## 2019-03-03 DIAGNOSIS — N183 Chronic kidney disease, stage 3 (moderate): Secondary | ICD-10-CM | POA: Insufficient documentation

## 2019-03-03 DIAGNOSIS — Z794 Long term (current) use of insulin: Secondary | ICD-10-CM | POA: Diagnosis not present

## 2019-03-03 DIAGNOSIS — D638 Anemia in other chronic diseases classified elsewhere: Secondary | ICD-10-CM

## 2019-03-03 DIAGNOSIS — D649 Anemia, unspecified: Secondary | ICD-10-CM | POA: Diagnosis not present

## 2019-03-03 DIAGNOSIS — I130 Hypertensive heart and chronic kidney disease with heart failure and stage 1 through stage 4 chronic kidney disease, or unspecified chronic kidney disease: Secondary | ICD-10-CM | POA: Insufficient documentation

## 2019-03-03 LAB — CBC WITH DIFFERENTIAL (CANCER CENTER ONLY)
Abs Immature Granulocytes: 0.02 10*3/uL (ref 0.00–0.07)
Basophils Absolute: 0 10*3/uL (ref 0.0–0.1)
Basophils Relative: 0 %
Eosinophils Absolute: 0.2 10*3/uL (ref 0.0–0.5)
Eosinophils Relative: 3 %
HCT: 28.4 % — ABNORMAL LOW (ref 36.0–46.0)
Hemoglobin: 8.9 g/dL — ABNORMAL LOW (ref 12.0–15.0)
Immature Granulocytes: 0 %
Lymphocytes Relative: 32 %
Lymphs Abs: 2.4 10*3/uL (ref 0.7–4.0)
MCH: 28.3 pg (ref 26.0–34.0)
MCHC: 31.3 g/dL (ref 30.0–36.0)
MCV: 90.4 fL (ref 80.0–100.0)
Monocytes Absolute: 1 10*3/uL (ref 0.1–1.0)
Monocytes Relative: 13 %
Neutro Abs: 3.9 10*3/uL (ref 1.7–7.7)
Neutrophils Relative %: 52 %
Platelet Count: 296 10*3/uL (ref 150–400)
RBC: 3.14 MIL/uL — ABNORMAL LOW (ref 3.87–5.11)
RDW: 14.1 % (ref 11.5–15.5)
WBC Count: 7.6 10*3/uL (ref 4.0–10.5)
nRBC: 0 % (ref 0.0–0.2)

## 2019-03-03 LAB — CMP (CANCER CENTER ONLY)
ALT: 23 U/L (ref 0–44)
AST: 25 U/L (ref 15–41)
Albumin: 3 g/dL — ABNORMAL LOW (ref 3.5–5.0)
Alkaline Phosphatase: 72 U/L (ref 38–126)
Anion gap: 7 (ref 5–15)
BUN: 30 mg/dL — ABNORMAL HIGH (ref 8–23)
CO2: 33 mmol/L — ABNORMAL HIGH (ref 22–32)
Calcium: 9.1 mg/dL (ref 8.9–10.3)
Chloride: 99 mmol/L (ref 98–111)
Creatinine: 1.31 mg/dL — ABNORMAL HIGH (ref 0.44–1.00)
GFR, Est AFR Am: 45 mL/min — ABNORMAL LOW (ref >60)
GFR, Estimated: 39 mL/min — ABNORMAL LOW (ref >60)
Glucose, Bld: 105 mg/dL — ABNORMAL HIGH (ref 70–99)
Potassium: 5.5 mmol/L — ABNORMAL HIGH (ref 3.5–5.1)
Sodium: 139 mmol/L (ref 135–145)
Total Bilirubin: <0.2 mg/dL — ABNORMAL LOW (ref 0.3–1.2)
Total Protein: 6.4 g/dL — ABNORMAL LOW (ref 6.5–8.1)

## 2019-03-03 LAB — IRON AND TIBC
Iron: 65 ug/dL (ref 41–142)
Saturation Ratios: 28 % (ref 21–57)
TIBC: 229 ug/dL — ABNORMAL LOW (ref 236–444)
UIBC: 164 ug/dL (ref 120–384)

## 2019-03-03 LAB — FERRITIN: Ferritin: 205 ng/mL (ref 11–307)

## 2019-03-03 IMAGING — CR DG CHEST 2V
2 series · 2 of 2 positions shown · non-contrast
Comparison: 04/13/2018

CLINICAL DATA: Short of breath hyperglycemia

EXAM:
CHEST - 2 VIEW

[w chest pa]
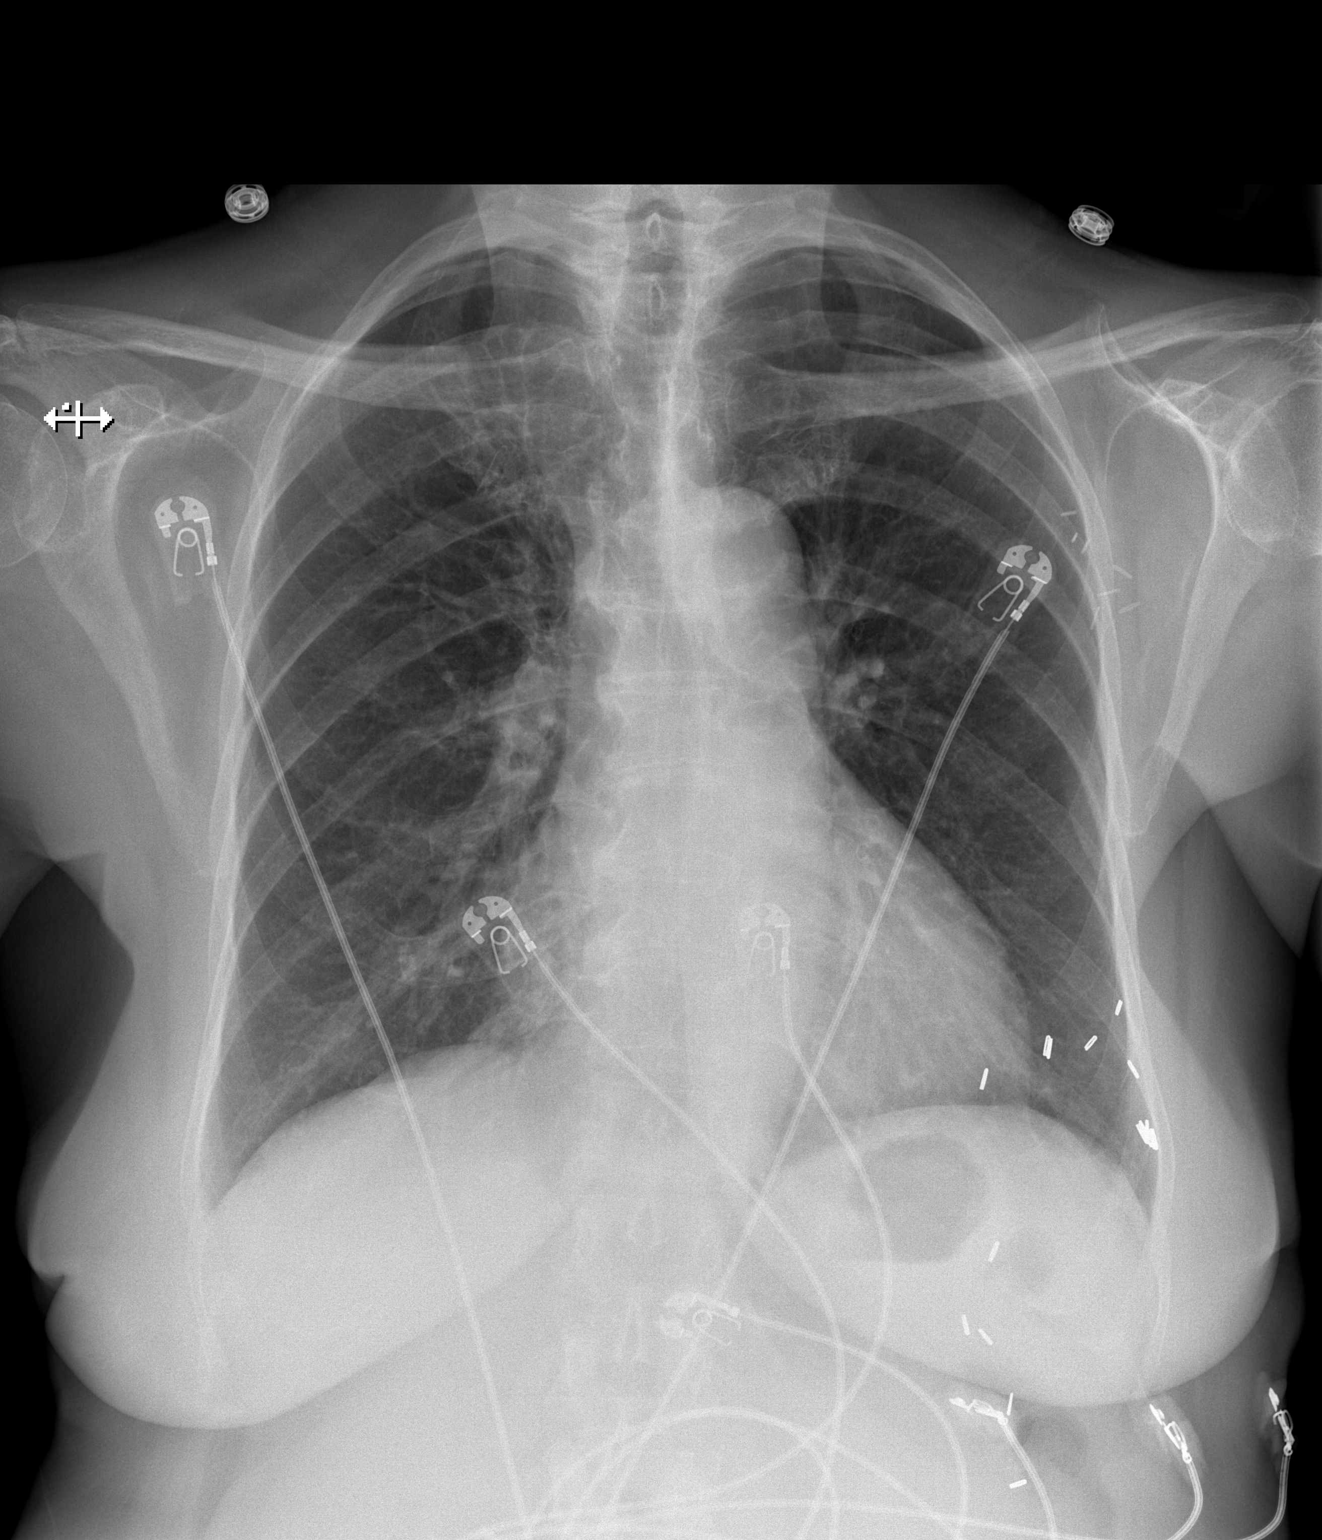

[w chest lat]
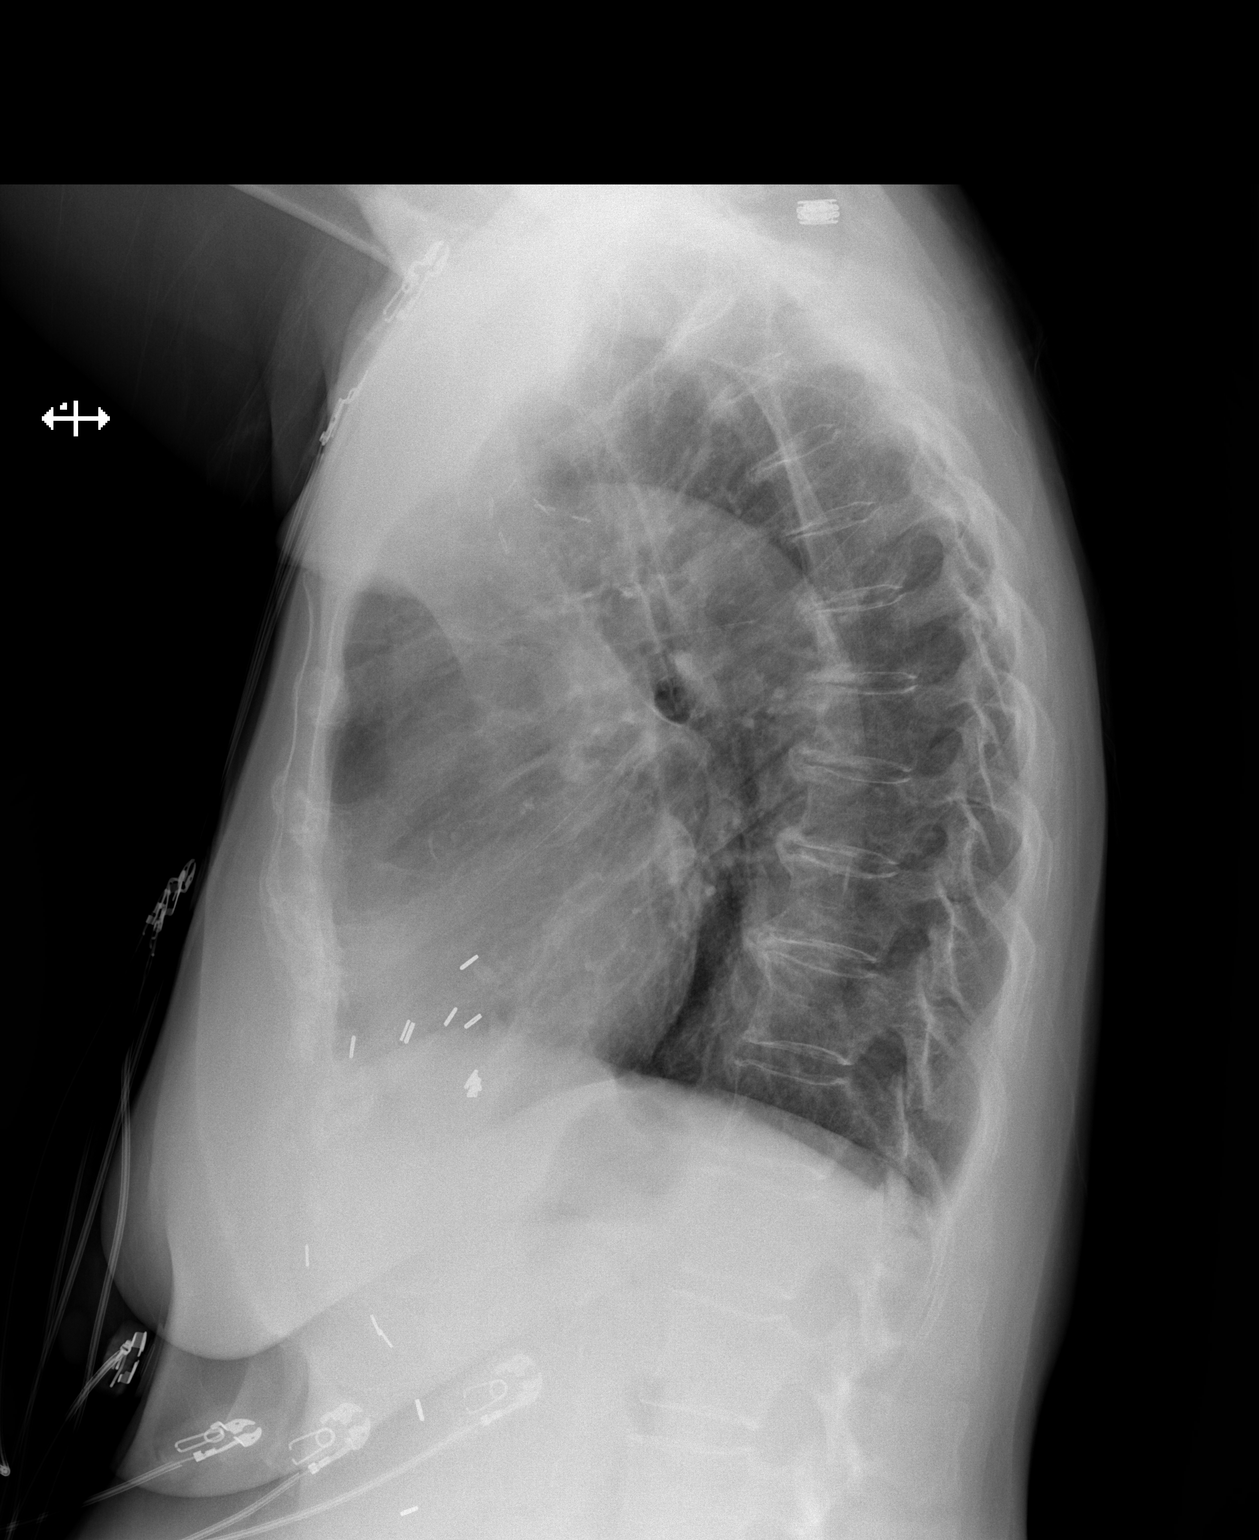

[2 of 2 positions shown; findings below may reference images not displayed]

FINDINGS: The heart size and mediastinal contours are within normal limits.
Both lungs are clear. The visualized skeletal structures are
unremarkable. Surgical clips in left breast from prior biopsy..
IMPRESSION: No active cardiopulmonary disease.

## 2019-03-03 NOTE — Telephone Encounter (Signed)
Elizabeth Olsen called desk nurse to report feeling very fatigued lately. She was brought in for lab only today. CBC shows Hgb 8.9. iron studies are adequate. K 5.5, BG 105, Cr. 1.3. she has not had aranesp inj since 12/2018. She tires easily and is dyspneic on exertion. Denies fever, cough, chest pain, palpitation, headache, diarrhea, vomiting. She is eating and drinking well. She has no other concerns, other than fatigue, which she commonly feels this way when Hgb is low. Will send schedule message to move 5/20 f/u and aranesp to 5/13 per patient's request. She denies other needs and appreciates the call. Elizabeth Rue, NP  03/03/2019

## 2019-03-04 ENCOUNTER — Telehealth: Payer: Self-pay | Admitting: Nurse Practitioner

## 2019-03-04 LAB — VITAMIN D 25 HYDROXY (VIT D DEFICIENCY, FRACTURES): Vit D, 25-Hydroxy: 60.4 ng/mL (ref 30.0–100.0)

## 2019-03-04 NOTE — Telephone Encounter (Signed)
Per sch msg, rescheduled 5/20 appt to 5/13. Called and spoke with patient. Patient needs 5/12 due to husband having appt that day already. Moved to 5/12 per patients request.

## 2019-03-05 ENCOUNTER — Other Ambulatory Visit: Payer: Medicare Other

## 2019-03-05 ENCOUNTER — Ambulatory Visit: Payer: Medicare Other

## 2019-03-05 ENCOUNTER — Ambulatory Visit: Payer: Medicare Other | Admitting: Hematology

## 2019-03-11 ENCOUNTER — Inpatient Hospital Stay: Payer: Medicare Other

## 2019-03-11 ENCOUNTER — Inpatient Hospital Stay (HOSPITAL_BASED_OUTPATIENT_CLINIC_OR_DEPARTMENT_OTHER): Payer: Medicare Other | Admitting: Nurse Practitioner

## 2019-03-11 ENCOUNTER — Encounter: Payer: Self-pay | Admitting: Nurse Practitioner

## 2019-03-11 ENCOUNTER — Other Ambulatory Visit: Payer: Self-pay

## 2019-03-11 VITALS — BP 158/74 | HR 87 | Temp 98.0°F | Resp 18 | Ht 62.5 in | Wt 154.4 lb

## 2019-03-11 DIAGNOSIS — D638 Anemia in other chronic diseases classified elsewhere: Secondary | ICD-10-CM

## 2019-03-11 DIAGNOSIS — E1122 Type 2 diabetes mellitus with diabetic chronic kidney disease: Secondary | ICD-10-CM

## 2019-03-11 DIAGNOSIS — E875 Hyperkalemia: Secondary | ICD-10-CM

## 2019-03-11 DIAGNOSIS — Z794 Long term (current) use of insulin: Secondary | ICD-10-CM

## 2019-03-11 DIAGNOSIS — K5900 Constipation, unspecified: Secondary | ICD-10-CM

## 2019-03-11 DIAGNOSIS — I130 Hypertensive heart and chronic kidney disease with heart failure and stage 1 through stage 4 chronic kidney disease, or unspecified chronic kidney disease: Secondary | ICD-10-CM

## 2019-03-11 DIAGNOSIS — D649 Anemia, unspecified: Secondary | ICD-10-CM | POA: Diagnosis not present

## 2019-03-11 DIAGNOSIS — N183 Chronic kidney disease, stage 3 (moderate): Secondary | ICD-10-CM

## 2019-03-11 DIAGNOSIS — M542 Cervicalgia: Secondary | ICD-10-CM

## 2019-03-11 LAB — CBC WITH DIFFERENTIAL (CANCER CENTER ONLY)
Abs Immature Granulocytes: 0.04 10*3/uL (ref 0.00–0.07)
Basophils Absolute: 0 10*3/uL (ref 0.0–0.1)
Basophils Relative: 1 %
Eosinophils Absolute: 0.2 10*3/uL (ref 0.0–0.5)
Eosinophils Relative: 3 %
HCT: 27.1 % — ABNORMAL LOW (ref 36.0–46.0)
Hemoglobin: 8.5 g/dL — ABNORMAL LOW (ref 12.0–15.0)
Immature Granulocytes: 1 %
Lymphocytes Relative: 29 %
Lymphs Abs: 2.2 10*3/uL (ref 0.7–4.0)
MCH: 28.3 pg (ref 26.0–34.0)
MCHC: 31.4 g/dL (ref 30.0–36.0)
MCV: 90.3 fL (ref 80.0–100.0)
Monocytes Absolute: 0.8 10*3/uL (ref 0.1–1.0)
Monocytes Relative: 11 %
Neutro Abs: 4.4 10*3/uL (ref 1.7–7.7)
Neutrophils Relative %: 55 %
Platelet Count: 297 10*3/uL (ref 150–400)
RBC: 3 MIL/uL — ABNORMAL LOW (ref 3.87–5.11)
RDW: 13.8 % (ref 11.5–15.5)
WBC Count: 7.7 10*3/uL (ref 4.0–10.5)
nRBC: 0 % (ref 0.0–0.2)

## 2019-03-11 LAB — IRON AND TIBC
Iron: 54 ug/dL (ref 41–142)
Saturation Ratios: 23 % (ref 21–57)
TIBC: 233 ug/dL — ABNORMAL LOW (ref 236–444)
UIBC: 178 ug/dL (ref 120–384)

## 2019-03-11 LAB — FERRITIN: Ferritin: 186 ng/mL (ref 11–307)

## 2019-03-11 MED ORDER — EPOETIN ALFA-EPBX 10000 UNIT/ML IJ SOLN
20000.0000 [IU] | Freq: Once | INTRAMUSCULAR | Status: AC
  Administered 2019-03-11: 20000 [IU] via SUBCUTANEOUS
  Filled 2019-03-11: qty 2

## 2019-03-11 NOTE — Patient Instructions (Signed)
Epoetin Alfa injection °What is this medicine? °EPOETIN ALFA (e POE e tin AL fa) helps your body make more red blood cells. This medicine is used to treat anemia caused by chronic kidney disease, cancer chemotherapy, or HIV-therapy. It may also be used before surgery if you have anemia. °This medicine may be used for other purposes; ask your health care provider or pharmacist if you have questions. °COMMON BRAND NAME(S): Epogen, Procrit, Retacrit °What should I tell my health care provider before I take this medicine? °They need to know if you have any of these conditions: °-cancer °-heart disease °-high blood pressure °-history of blood clots °-history of stroke °-low levels of folate, iron, or vitamin B12 in the blood °-seizures °-an unusual or allergic reaction to erythropoietin, albumin, benzyl alcohol, hamster proteins, other medicines, foods, dyes, or preservatives °-pregnant or trying to get pregnant °-breast-feeding °How should I use this medicine? °This medicine is for injection into a vein or under the skin. It is usually given by a health care professional in a hospital or clinic setting. °If you get this medicine at home, you will be taught how to prepare and give this medicine. Use exactly as directed. Take your medicine at regular intervals. Do not take your medicine more often than directed. °It is important that you put your used needles and syringes in a special sharps container. Do not put them in a trash can. If you do not have a sharps container, call your pharmacist or healthcare provider to get one. °A special MedGuide will be given to you by the pharmacist with each prescription and refill. Be sure to read this information carefully each time. °Talk to your pediatrician regarding the use of this medicine in children. While this drug may be prescribed for selected conditions, precautions do apply. °Overdosage: If you think you have taken too much of this medicine contact a poison control center  or emergency room at once. °NOTE: This medicine is only for you. Do not share this medicine with others. °What if I miss a dose? °If you miss a dose, take it as soon as you can. If it is almost time for your next dose, take only that dose. Do not take double or extra doses. °What may interact with this medicine? °Interactions have not been studied. °This list may not describe all possible interactions. Give your health care provider a list of all the medicines, herbs, non-prescription drugs, or dietary supplements you use. Also tell them if you smoke, drink alcohol, or use illegal drugs. Some items may interact with your medicine. °What should I watch for while using this medicine? °Your condition will be monitored carefully while you are receiving this medicine. °You may need blood work done while you are taking this medicine. °This medicine may cause a decrease in vitamin B6. You should make sure that you get enough vitamin B6 while you are taking this medicine. Discuss the foods you eat and the vitamins you take with your health care professional. °What side effects may I notice from receiving this medicine? °Side effects that you should report to your doctor or health care professional as soon as possible: °-allergic reactions like skin rash, itching or hives, swelling of the face, lips, or tongue °-seizures °-signs and symptoms of a blood clot such as breathing problems; changes in vision; chest pain; severe, sudden headache; pain, swelling, warmth in the leg; trouble speaking; sudden numbness or weakness of the face, arm or leg °-signs and symptoms of a stroke   like changes in vision; confusion; trouble speaking or understanding; severe headaches; sudden numbness or weakness of the face, arm or leg; trouble walking; dizziness; loss of balance or coordination °Side effects that usually do not require medical attention (report to your doctor or health care professional if they continue or are  bothersome): °-chills °-cough °-dizziness °-fever °-headaches °-joint pain °-muscle cramps °-muscle pain °-nausea, vomiting °-pain, redness, or irritation at site where injected °This list may not describe all possible side effects. Call your doctor for medical advice about side effects. You may report side effects to FDA at 1-800-FDA-1088. °Where should I keep my medicine? °Keep out of the reach of children. °Store in a refrigerator between 2 and 8 degrees C (36 and 46 degrees F). Do not freeze or shake. Throw away any unused portion if using a single-dose vial. Multi-dose vials can be kept in the refrigerator for up to 21 days after the initial dose. Throw away unused medicine. °NOTE: This sheet is a summary. It may not cover all possible information. If you have questions about this medicine, talk to your doctor, pharmacist, or health care provider. °© 2019 Elsevier/Gold Standard (2017-05-25 08:35:19) ° °

## 2019-03-11 NOTE — Progress Notes (Signed)
[facility]   Telephone:(336) [phone] Fax:(336) [phone]   Clinic Follow up Note   Patient Care Team: [name], MD as PCP - General (Family Medicine) Park Liter, MD as PCP - Cardiology (Cardiology) Reynold Bowen, MD as Consulting Physician (Endocrinology) Bobbye Charleston, MD as Consulting Physician (Obstetrics and Gynecology) 03/11/2019  CHIEF COMPLAINT: f/u anemia of chronic disease   CURRENT THERAPY:  Aranesp injection 150mcg monthly started on 01/01/18; last dose 01/14/2019 Change to Retacrit q2 weeks as of 03/11/19   INTERVAL HISTORY: Elizabeth Olsen returns for follow up as scheduled. She was last seen 08/2018. Last aranesp in 12/2018. She feels well, other than being very tired. She continues to function independently and cooks, does chores, etc with short breaks. She is eating healthy and takes vitamins. She drinks well. Weight has increased. Has intermittent constipation. Denies bleeding. DOE is at baseline. She has mild leg swelling after being on her feet all day, this has been going on "forever." Mild neck pain is unchanged. PT on hold for covid. She has had telehealth visits with PCP, cardiology, and endocrinology recently. BG ranges from 200 to over 600. Denies fever, chills, chest pain, dyspnea.    REVIEW OF SYSTEMS:   Constitutional: Denies fevers, chills or abnormal weight loss (+) weight gain (+) fatigue  Ears, nose, mouth, throat, and face: Denies mucositis or sore throat Respiratory: Denies cough or wheezes (+) DOE Cardiovascular: Denies palpitation, chest discomfort (+) lower extremity swelling Gastrointestinal:  Denies nausea, vomiting, diarrhea, GI bleeding, heartburn or change in bowel habits (+) constipation  Skin: Denies abnormal skin rashes Lymphatics: Denies new lymphadenopathy or easy bruising Neurological:Denies numbness, tingling or new weaknesses Behavioral/Psych: Mood is stable, no new changes  All other systems were reviewed with  the patient and are negative.  MEDICAL HISTORY:  Past Medical History:  Diagnosis Date  . Anemia   . Blood transfusion   . Blood transfusion without reported diagnosis    with breast surgery  . Breast cancer ([place]) 15 years ago   left   . CHF (congestive heart failure) ([place])   . 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 Diabetes mellitus    dx 1998.  was told prior to getting chemo that her bld sugar rose.  She thought it would go back  . Dyspnea    d/t anemia  . GERD (gastroesophageal reflux disease)   . Heart murmur    as child  . Hernia, incisional   . Hyperlipidemia   . Hypertension   . Hypothyroidism   . Vitamin D deficiency     SURGICAL HISTORY: Past Surgical History:  Procedure Laterality Date  . ABDOMINAL HYSTERECTOMY    . APPENDECTOMY    . BREAST SURGERY    . CHOLECYSTECTOMY  01/22/2018   LAPROSCOPIC   . CHOLECYSTECTOMY N/A 01/22/2018   Procedure: LAPAROSCOPIC CHOLECYSTECTOMY WITH INTRAOPERATIVE CHOLANGIOGRAM;  Surgeon: Donnie Mesa, MD;  Location: [facility];  Service: General;  Laterality: N/A;  . COLONOSCOPY  2013   due next 03-2017  . EYE SURGERY     bil cataracts  . IUD REMOVAL     with appendectomy  . MASTECTOMY, MODIFIED RADICAL W/RECONSTRUCTION Left 15 years ago   10 nodes out  . TUMOR EXCISION Left    x 2, neck, head    I have reviewed the social history and family history with the patient and they are unchanged from previous note.  ALLERGIES:  is allergic to erythromycin; penicillins; nyquil [pseudoeph-doxylamine-dm-apap]; and sulfa antibiotics.  MEDICATIONS:  Current Outpatient Medications  Medication Sig Dispense Refill  . atorvastatin (LIPITOR) 10 MG tablet TAKE 1 TABLET BY MOUTH EVERY DAY ONE TIME ONLY (Patient taking differently: Take 10 mg by mouth daily. ) 90 tablet 2  . calcium carbonate (OSCAL) 1500 (600 Ca) MG TABS tablet Take 600 mg by mouth daily with breakfast.     . carvedilol (COREG) 6.25 MG tablet Take 1 tablet (6.25 mg total) by mouth  2 (two) times daily. 180 tablet 1  . fenofibrate 160 MG tablet Take 160 mg by mouth daily.    . fish oil-omega-3 fatty acids 1000 MG capsule Take 1 g by mouth daily.     . furosemide (LASIX) 20 MG tablet TAKE 2 TABLETS(40 MG) BY MOUTH DAILY 90 tablet 1  . Insulin Glargine (LANTUS SOLOSTAR) 100 UNIT/ML Solostar Pen Inject 8 Units into the skin 2 (two) times daily.     . insulin lispro (HUMALOG KWIKPEN) 100 UNIT/ML KiwkPen Inject 5-20 Units into the skin 3 (three) times daily. Sliding Scale    . iron polysaccharides (NIFEREX) 150 MG capsule Take 150 mg by mouth 2 (two) times daily.     Marland Kitchen levothyroxine (SYNTHROID, LEVOTHROID) 112 MCG tablet Take 112 mcg by mouth See admin instructions. Take 1 tablet (112 mcg) by mouth on 6 days of the week; hold on Sundays.    Marland Kitchen losartan (COZAAR) 25 MG tablet Take 1 tablet (25 mg total) by mouth daily. 90 tablet 3  . Menthol-Methyl Salicylate (MUSCLE RUB) 10-15 % CREA Apply 1 application topically 4 (four) times daily as needed for muscle pain.     Glory Rosebush VERIO test strip USE TO TEST UP TO 10 TIMES D  12  . vitamin C (ASCORBIC ACID) 500 MG tablet Take 500 mg by mouth daily.    . Vitamin D, Ergocalciferol, (DRISDOL) 50000 UNITS CAPS Take 50,000 Units by mouth 2 (two) times a week. Sundays & Wednesdays.     No current facility-administered medications for this visit.     PHYSICAL EXAMINATION: ECOG PERFORMANCE STATUS: 1 - Symptomatic but completely ambulatory  Vitals:   03/11/19 1131  BP: (!) 158/74  Pulse: 87  Resp: 18  Temp: 98 F (36.7 C)  SpO2: 100%   Filed Weights   03/11/19 1131  Weight: 154 lb 6.4 oz (70 kg)    GENERAL:alert, no distress and comfortable SKIN: no rashes EYES: sclera clear LYMPH:  no palpable cervical lymphadenopathy LUNGS: clear to auscultation with normal breathing effort HEART: regular rate & rhythm, trace bilateral lower extremity edema ABDOMEN:abdomen soft, non-tender and normal bowel sounds Musculoskeletal:no  cyanosis of digits and no clubbing  NEURO: nonfocal  LABORATORY DATA:  I have reviewed the data as listed CBC Latest Ref Rng & Units 03/11/2019 03/03/2019 01/14/2019  WBC 4.0 - 10.5 K/uL 7.7 7.6 7.7  Hemoglobin 12.0 - 15.0 g/dL 8.5(L) 8.9(L) 10.0(L)  Hematocrit 36.0 - 46.0 % 27.1(L) 28.4(L) 32.2(L)  Platelets 150 - 400 K/uL 297 296 345     CMP Latest Ref Rng & Units 03/03/2019 09/04/2018 09/04/2018  Glucose 70 - 99 mg/dL 105(H) 478(H) 694(HH)  BUN 8 - 23 mg/dL 30(H) 42(H) 38(H)  Creatinine 0.44 - 1.00 mg/dL 1.31(H) 1.23(H) 1.60(H)  Sodium 135 - 145 mmol/L 139 133(L) 133(L)  Potassium 3.5 - 5.1 mmol/L 5.5(H) 4.6 5.1  Chloride 98 - 111 mmol/L 99 93(L) 97(L)  CO2 22 - 32 mmol/L 33(H) 30 27  Calcium 8.9 - 10.3 mg/dL 9.1 9.6 9.4  Total Protein  6.5 - 8.1 g/dL 6.4(L) - 6.9  Total Bilirubin 0.3 - 1.2 mg/dL <0.2(L) - 0.2(L)  Alkaline Phos 38 - 126 U/L 72 - 56  AST 15 - 41 U/L 25 - 21  ALT 0 - 44 U/L 23 - 14      RADIOGRAPHIC STUDIES: I have personally reviewed the radiological images as listed and agreed with the findings in the report. No results found.   ASSESSMENT & PLAN: 78 year old female with anemia, CHF, DM, and CKD   1. Anemia of chronic disease  2. CHF and HTN - followed by Dr. Agustin Cree 3. DM - followed by Dr. Forde Dandy 4. Stage III CKD  Elizabeth Olsen appears stable. Hgb trending down since March 2020 to 8.5 today, she is symptomatic with increased fatigue. Her worsened anemia is likely due to uncontrolled chronic conditions. She also has not had Aranesp since 12/2018. Her dose in 01/2019 was held for uncontrolled HTN. Will change Aranesp to Retacrit, short-acting epo, due to insurance. Will start every 2 weeks for now. May need to increase to weekly if needed. She will get first dose today and return for lab and injection in 2 weeks. F/u in 2 months.   For DM on insulin, her blood sugars run 200 to over 600 occasionally. Few episodes <100 per her meter. She is followed by Dr. Forde Dandy. She  had recent telehealth visit. We reviewed healthy diet, increasing fluids, exercise as tolerated, and adherence to insulin regimen.  Labs reviewed. CBC otherwise stable. Iron studies are normal, she takes 2 tabs daily. CMP on 03/03/19 showed hyperkalemia K 5.5, likely due to renal disease. She is not on potassium supplement but does have K-rich diet. I recommend she reduce potassium in her diet. She is on lasix. Cr is improved. Will monitor closely.   PLAN: -Labs reviewed  -Change Aranesp to Retacrit, due today -Lab, injection every 2 weeks  -F/u in 2 months  -F/u with PCP, cardiology, endo  All questions were answered. The patient knows to call the clinic with any problems, questions or concerns. No barriers to learning was detected.     Elizabeth Feeling, NP 03/11/19

## 2019-03-12 ENCOUNTER — Ambulatory Visit: Payer: Medicare Other | Admitting: Nurse Practitioner

## 2019-03-12 ENCOUNTER — Ambulatory Visit: Payer: Self-pay

## 2019-03-12 ENCOUNTER — Telehealth: Payer: Self-pay | Admitting: Nurse Practitioner

## 2019-03-12 ENCOUNTER — Other Ambulatory Visit: Payer: Medicare Other

## 2019-03-12 NOTE — Telephone Encounter (Signed)
Scheduled appt per 5/12 los.  A calendar will be mailed out.

## 2019-03-18 ENCOUNTER — Telehealth: Payer: Self-pay | Admitting: Cardiology

## 2019-03-18 NOTE — Telephone Encounter (Signed)
Check with Dr Raliegh Ip in AM

## 2019-03-18 NOTE — Telephone Encounter (Signed)
Patient was put on Epoetin Alfa by her oncologist. She states one side effect is blood clots so wanting to know if she can take two baby aspirins per day instead of just one. Please advise.

## 2019-03-19 ENCOUNTER — Ambulatory Visit: Payer: Medicare Other

## 2019-03-19 ENCOUNTER — Other Ambulatory Visit: Payer: Medicare Other

## 2019-03-19 ENCOUNTER — Ambulatory Visit: Payer: Medicare Other | Admitting: Hematology

## 2019-03-19 NOTE — Telephone Encounter (Signed)
Patient informed to only take one 81 mg aspirin daily. Patient verbally understands, no further questions.

## 2019-03-19 NOTE — Telephone Encounter (Signed)
No, she should keep doing just one asa 81 mg po qd

## 2019-03-25 ENCOUNTER — Inpatient Hospital Stay: Payer: Medicare Other

## 2019-03-25 ENCOUNTER — Other Ambulatory Visit: Payer: Self-pay

## 2019-03-25 VITALS — BP 155/70 | HR 88 | Temp 98.1°F | Resp 18

## 2019-03-25 DIAGNOSIS — D638 Anemia in other chronic diseases classified elsewhere: Secondary | ICD-10-CM

## 2019-03-25 DIAGNOSIS — D649 Anemia, unspecified: Secondary | ICD-10-CM | POA: Diagnosis not present

## 2019-03-25 LAB — FERRITIN: Ferritin: 156 ng/mL (ref 11–307)

## 2019-03-25 LAB — CBC WITH DIFFERENTIAL (CANCER CENTER ONLY)
Abs Immature Granulocytes: 0.01 10*3/uL (ref 0.00–0.07)
Basophils Absolute: 0 10*3/uL (ref 0.0–0.1)
Basophils Relative: 0 %
Eosinophils Absolute: 0.2 10*3/uL (ref 0.0–0.5)
Eosinophils Relative: 2 %
HCT: 29.1 % — ABNORMAL LOW (ref 36.0–46.0)
Hemoglobin: 9.1 g/dL — ABNORMAL LOW (ref 12.0–15.0)
Immature Granulocytes: 0 %
Lymphocytes Relative: 26 %
Lymphs Abs: 1.7 10*3/uL (ref 0.7–4.0)
MCH: 28.8 pg (ref 26.0–34.0)
MCHC: 31.3 g/dL (ref 30.0–36.0)
MCV: 92.1 fL (ref 80.0–100.0)
Monocytes Absolute: 0.9 10*3/uL (ref 0.1–1.0)
Monocytes Relative: 13 %
Neutro Abs: 3.9 10*3/uL (ref 1.7–7.7)
Neutrophils Relative %: 59 %
Platelet Count: 290 10*3/uL (ref 150–400)
RBC: 3.16 MIL/uL — ABNORMAL LOW (ref 3.87–5.11)
RDW: 14.6 % (ref 11.5–15.5)
WBC Count: 6.8 10*3/uL (ref 4.0–10.5)
nRBC: 0 % (ref 0.0–0.2)

## 2019-03-25 LAB — IRON AND TIBC
Iron: 82 ug/dL (ref 41–142)
Saturation Ratios: 34 % (ref 21–57)
TIBC: 243 ug/dL (ref 236–444)
UIBC: 161 ug/dL (ref 120–384)

## 2019-03-25 MED ORDER — EPOETIN ALFA-EPBX 10000 UNIT/ML IJ SOLN
20000.0000 [IU] | Freq: Once | INTRAMUSCULAR | Status: AC
  Administered 2019-03-25: 20000 [IU] via SUBCUTANEOUS
  Filled 2019-03-25: qty 2

## 2019-03-26 ENCOUNTER — Telehealth: Payer: Self-pay

## 2019-03-26 NOTE — Telephone Encounter (Signed)
Spoke with patient regarding lab results.  Per Dr. Burr Medico iron study is normal, no concerns.  Patient verbalized an understanding.

## 2019-03-26 NOTE — Telephone Encounter (Signed)
-----   Message from Truitt Merle, MD sent at 03/26/2019  7:35 AM EDT ----- Please let pt know her lab results, iron study normal, no concerns, thanks  Truitt Merle  03/26/2019

## 2019-04-08 ENCOUNTER — Inpatient Hospital Stay: Payer: Medicare Other

## 2019-04-08 ENCOUNTER — Other Ambulatory Visit: Payer: Self-pay

## 2019-04-08 ENCOUNTER — Inpatient Hospital Stay: Payer: Medicare Other | Attending: Hematology

## 2019-04-08 VITALS — BP 154/78 | HR 87 | Temp 98.2°F | Resp 17

## 2019-04-08 DIAGNOSIS — N183 Chronic kidney disease, stage 3 (moderate): Secondary | ICD-10-CM | POA: Diagnosis present

## 2019-04-08 DIAGNOSIS — D638 Anemia in other chronic diseases classified elsewhere: Secondary | ICD-10-CM

## 2019-04-08 DIAGNOSIS — D649 Anemia, unspecified: Secondary | ICD-10-CM | POA: Diagnosis present

## 2019-04-08 LAB — CBC WITH DIFFERENTIAL (CANCER CENTER ONLY)
Abs Immature Granulocytes: 0.01 10*3/uL (ref 0.00–0.07)
Basophils Absolute: 0 10*3/uL (ref 0.0–0.1)
Basophils Relative: 1 %
Eosinophils Absolute: 0.2 10*3/uL (ref 0.0–0.5)
Eosinophils Relative: 3 %
HCT: 31.4 % — ABNORMAL LOW (ref 36.0–46.0)
Hemoglobin: 9.8 g/dL — ABNORMAL LOW (ref 12.0–15.0)
Immature Granulocytes: 0 %
Lymphocytes Relative: 26 %
Lymphs Abs: 1.7 10*3/uL (ref 0.7–4.0)
MCH: 27.9 pg (ref 26.0–34.0)
MCHC: 31.2 g/dL (ref 30.0–36.0)
MCV: 89.5 fL (ref 80.0–100.0)
Monocytes Absolute: 0.8 10*3/uL (ref 0.1–1.0)
Monocytes Relative: 13 %
Neutro Abs: 3.8 10*3/uL (ref 1.7–7.7)
Neutrophils Relative %: 57 %
Platelet Count: 311 10*3/uL (ref 150–400)
RBC: 3.51 MIL/uL — ABNORMAL LOW (ref 3.87–5.11)
RDW: 14.4 % (ref 11.5–15.5)
WBC Count: 6.6 10*3/uL (ref 4.0–10.5)
nRBC: 0 % (ref 0.0–0.2)

## 2019-04-08 LAB — IRON AND TIBC
Iron: 80 ug/dL (ref 41–142)
Saturation Ratios: 32 % (ref 21–57)
TIBC: 248 ug/dL (ref 236–444)
UIBC: 167 ug/dL (ref 120–384)

## 2019-04-08 LAB — FERRITIN: Ferritin: 152 ng/mL (ref 11–307)

## 2019-04-08 MED ORDER — EPOETIN ALFA-EPBX 10000 UNIT/ML IJ SOLN
20000.0000 [IU] | Freq: Once | INTRAMUSCULAR | Status: AC
  Administered 2019-04-08: 20000 [IU] via SUBCUTANEOUS
  Filled 2019-04-08: qty 2

## 2019-04-08 NOTE — Patient Instructions (Signed)
Epoetin Alfa injection °What is this medicine? °EPOETIN ALFA (e POE e tin AL fa) helps your body make more red blood cells. This medicine is used to treat anemia caused by chronic kidney disease, cancer chemotherapy, or HIV-therapy. It may also be used before surgery if you have anemia. °This medicine may be used for other purposes; ask your health care provider or pharmacist if you have questions. °COMMON BRAND NAME(S): Epogen, Procrit, Retacrit °What should I tell my health care provider before I take this medicine? °They need to know if you have any of these conditions: °-cancer °-heart disease °-high blood pressure °-history of blood clots °-history of stroke °-low levels of folate, iron, or vitamin B12 in the blood °-seizures °-an unusual or allergic reaction to erythropoietin, albumin, benzyl alcohol, hamster proteins, other medicines, foods, dyes, or preservatives °-pregnant or trying to get pregnant °-breast-feeding °How should I use this medicine? °This medicine is for injection into a vein or under the skin. It is usually given by a health care professional in a hospital or clinic setting. °If you get this medicine at home, you will be taught how to prepare and give this medicine. Use exactly as directed. Take your medicine at regular intervals. Do not take your medicine more often than directed. °It is important that you put your used needles and syringes in a special sharps container. Do not put them in a trash can. If you do not have a sharps container, call your pharmacist or healthcare provider to get one. °A special MedGuide will be given to you by the pharmacist with each prescription and refill. Be sure to read this information carefully each time. °Talk to your pediatrician regarding the use of this medicine in children. While this drug may be prescribed for selected conditions, precautions do apply. °Overdosage: If you think you have taken too much of this medicine contact a poison control center  or emergency room at once. °NOTE: This medicine is only for you. Do not share this medicine with others. °What if I miss a dose? °If you miss a dose, take it as soon as you can. If it is almost time for your next dose, take only that dose. Do not take double or extra doses. °What may interact with this medicine? °Interactions have not been studied. °This list may not describe all possible interactions. Give your health care provider a list of all the medicines, herbs, non-prescription drugs, or dietary supplements you use. Also tell them if you smoke, drink alcohol, or use illegal drugs. Some items may interact with your medicine. °What should I watch for while using this medicine? °Your condition will be monitored carefully while you are receiving this medicine. °You may need blood work done while you are taking this medicine. °This medicine may cause a decrease in vitamin B6. You should make sure that you get enough vitamin B6 while you are taking this medicine. Discuss the foods you eat and the vitamins you take with your health care professional. °What side effects may I notice from receiving this medicine? °Side effects that you should report to your doctor or health care professional as soon as possible: °-allergic reactions like skin rash, itching or hives, swelling of the face, lips, or tongue °-seizures °-signs and symptoms of a blood clot such as breathing problems; changes in vision; chest pain; severe, sudden headache; pain, swelling, warmth in the leg; trouble speaking; sudden numbness or weakness of the face, arm or leg °-signs and symptoms of a stroke   like changes in vision; confusion; trouble speaking or understanding; severe headaches; sudden numbness or weakness of the face, arm or leg; trouble walking; dizziness; loss of balance or coordination °Side effects that usually do not require medical attention (report to your doctor or health care professional if they continue or are  bothersome): °-chills °-cough °-dizziness °-fever °-headaches °-joint pain °-muscle cramps °-muscle pain °-nausea, vomiting °-pain, redness, or irritation at site where injected °This list may not describe all possible side effects. Call your doctor for medical advice about side effects. You may report side effects to FDA at 1-800-FDA-1088. °Where should I keep my medicine? °Keep out of the reach of children. °Store in a refrigerator between 2 and 8 degrees C (36 and 46 degrees F). Do not freeze or shake. Throw away any unused portion if using a single-dose vial. Multi-dose vials can be kept in the refrigerator for up to 21 days after the initial dose. Throw away unused medicine. °NOTE: This sheet is a summary. It may not cover all possible information. If you have questions about this medicine, talk to your doctor, pharmacist, or health care provider. °© 2019 Elsevier/Gold Standard (2017-05-25 08:35:19) ° °

## 2019-04-10 ENCOUNTER — Telehealth: Payer: Self-pay | Admitting: *Deleted

## 2019-04-10 NOTE — Telephone Encounter (Signed)
Called pt and left message on voice mail re: iron study normal as per Dr. Burr Medico.

## 2019-04-10 NOTE — Telephone Encounter (Signed)
-----   Message from Truitt Merle, MD sent at 04/10/2019  7:48 AM EDT ----- Please let pt know her iron study was normal, thanks   Truitt Merle  04/10/2019

## 2019-04-22 ENCOUNTER — Other Ambulatory Visit: Payer: Self-pay

## 2019-04-22 ENCOUNTER — Inpatient Hospital Stay: Payer: Medicare Other

## 2019-04-22 VITALS — BP 154/76 | HR 85 | Temp 98.2°F | Resp 19

## 2019-04-22 DIAGNOSIS — D638 Anemia in other chronic diseases classified elsewhere: Secondary | ICD-10-CM

## 2019-04-22 DIAGNOSIS — D649 Anemia, unspecified: Secondary | ICD-10-CM | POA: Diagnosis not present

## 2019-04-22 LAB — CBC WITH DIFFERENTIAL (CANCER CENTER ONLY)
Abs Immature Granulocytes: 0.02 10*3/uL (ref 0.00–0.07)
Basophils Absolute: 0 10*3/uL (ref 0.0–0.1)
Basophils Relative: 0 %
Eosinophils Absolute: 0.2 10*3/uL (ref 0.0–0.5)
Eosinophils Relative: 3 %
HCT: 30.2 % — ABNORMAL LOW (ref 36.0–46.0)
Hemoglobin: 9.5 g/dL — ABNORMAL LOW (ref 12.0–15.0)
Immature Granulocytes: 0 %
Lymphocytes Relative: 30 %
Lymphs Abs: 2.1 10*3/uL (ref 0.7–4.0)
MCH: 27.9 pg (ref 26.0–34.0)
MCHC: 31.5 g/dL (ref 30.0–36.0)
MCV: 88.8 fL (ref 80.0–100.0)
Monocytes Absolute: 0.9 10*3/uL (ref 0.1–1.0)
Monocytes Relative: 13 %
Neutro Abs: 3.6 10*3/uL (ref 1.7–7.7)
Neutrophils Relative %: 54 %
Platelet Count: 329 10*3/uL (ref 150–400)
RBC: 3.4 MIL/uL — ABNORMAL LOW (ref 3.87–5.11)
RDW: 14.2 % (ref 11.5–15.5)
WBC Count: 6.9 10*3/uL (ref 4.0–10.5)
nRBC: 0 % (ref 0.0–0.2)

## 2019-04-22 LAB — IRON AND TIBC
Iron: 86 ug/dL (ref 41–142)
Saturation Ratios: 34 % (ref 21–57)
TIBC: 254 ug/dL (ref 236–444)
UIBC: 168 ug/dL (ref 120–384)

## 2019-04-22 LAB — FERRITIN: Ferritin: 164 ng/mL (ref 11–307)

## 2019-04-22 MED ORDER — EPOETIN ALFA-EPBX 10000 UNIT/ML IJ SOLN
20000.0000 [IU] | Freq: Once | INTRAMUSCULAR | Status: AC
  Administered 2019-04-22: 20000 [IU] via SUBCUTANEOUS
  Filled 2019-04-22: qty 2

## 2019-04-22 NOTE — Patient Instructions (Signed)
Epoetin Alfa injection °What is this medicine? °EPOETIN ALFA (e POE e tin AL fa) helps your body make more red blood cells. This medicine is used to treat anemia caused by chronic kidney disease, cancer chemotherapy, or HIV-therapy. It may also be used before surgery if you have anemia. °This medicine may be used for other purposes; ask your health care provider or pharmacist if you have questions. °COMMON BRAND NAME(S): Epogen, Procrit, Retacrit °What should I tell my health care provider before I take this medicine? °They need to know if you have any of these conditions: °-cancer °-heart disease °-high blood pressure °-history of blood clots °-history of stroke °-low levels of folate, iron, or vitamin B12 in the blood °-seizures °-an unusual or allergic reaction to erythropoietin, albumin, benzyl alcohol, hamster proteins, other medicines, foods, dyes, or preservatives °-pregnant or trying to get pregnant °-breast-feeding °How should I use this medicine? °This medicine is for injection into a vein or under the skin. It is usually given by a health care professional in a hospital or clinic setting. °If you get this medicine at home, you will be taught how to prepare and give this medicine. Use exactly as directed. Take your medicine at regular intervals. Do not take your medicine more often than directed. °It is important that you put your used needles and syringes in a special sharps container. Do not put them in a trash can. If you do not have a sharps container, call your pharmacist or healthcare provider to get one. °A special MedGuide will be given to you by the pharmacist with each prescription and refill. Be sure to read this information carefully each time. °Talk to your pediatrician regarding the use of this medicine in children. While this drug may be prescribed for selected conditions, precautions do apply. °Overdosage: If you think you have taken too much of this medicine contact a poison control center  or emergency room at once. °NOTE: This medicine is only for you. Do not share this medicine with others. °What if I miss a dose? °If you miss a dose, take it as soon as you can. If it is almost time for your next dose, take only that dose. Do not take double or extra doses. °What may interact with this medicine? °Interactions have not been studied. °This list may not describe all possible interactions. Give your health care provider a list of all the medicines, herbs, non-prescription drugs, or dietary supplements you use. Also tell them if you smoke, drink alcohol, or use illegal drugs. Some items may interact with your medicine. °What should I watch for while using this medicine? °Your condition will be monitored carefully while you are receiving this medicine. °You may need blood work done while you are taking this medicine. °This medicine may cause a decrease in vitamin B6. You should make sure that you get enough vitamin B6 while you are taking this medicine. Discuss the foods you eat and the vitamins you take with your health care professional. °What side effects may I notice from receiving this medicine? °Side effects that you should report to your doctor or health care professional as soon as possible: °-allergic reactions like skin rash, itching or hives, swelling of the face, lips, or tongue °-seizures °-signs and symptoms of a blood clot such as breathing problems; changes in vision; chest pain; severe, sudden headache; pain, swelling, warmth in the leg; trouble speaking; sudden numbness or weakness of the face, arm or leg °-signs and symptoms of a stroke   like changes in vision; confusion; trouble speaking or understanding; severe headaches; sudden numbness or weakness of the face, arm or leg; trouble walking; dizziness; loss of balance or coordination °Side effects that usually do not require medical attention (report to your doctor or health care professional if they continue or are  bothersome): °-chills °-cough °-dizziness °-fever °-headaches °-joint pain °-muscle cramps °-muscle pain °-nausea, vomiting °-pain, redness, or irritation at site where injected °This list may not describe all possible side effects. Call your doctor for medical advice about side effects. You may report side effects to FDA at 1-800-FDA-1088. °Where should I keep my medicine? °Keep out of the reach of children. °Store in a refrigerator between 2 and 8 degrees C (36 and 46 degrees F). Do not freeze or shake. Throw away any unused portion if using a single-dose vial. Multi-dose vials can be kept in the refrigerator for up to 21 days after the initial dose. Throw away unused medicine. °NOTE: This sheet is a summary. It may not cover all possible information. If you have questions about this medicine, talk to your doctor, pharmacist, or health care provider. °© 2019 Elsevier/Gold Standard (2017-05-25 08:35:19) ° °

## 2019-04-25 ENCOUNTER — Telehealth: Payer: Self-pay | Admitting: *Deleted

## 2019-04-25 NOTE — Telephone Encounter (Signed)
Pt notified of results

## 2019-04-25 NOTE — Telephone Encounter (Signed)
TCT patient regarding labs from 04/22/19. No answer but was able to leave vm message for patient. Anemia is stable, iron levels are normal.  Pt is to follow up as scheduled.

## 2019-05-06 ENCOUNTER — Inpatient Hospital Stay: Payer: Medicare Other

## 2019-05-06 ENCOUNTER — Other Ambulatory Visit: Payer: Self-pay

## 2019-05-06 ENCOUNTER — Inpatient Hospital Stay: Payer: Medicare Other | Attending: Hematology

## 2019-05-06 VITALS — BP 148/68 | HR 82 | Temp 98.2°F | Resp 18

## 2019-05-06 DIAGNOSIS — D631 Anemia in chronic kidney disease: Secondary | ICD-10-CM | POA: Diagnosis present

## 2019-05-06 DIAGNOSIS — N183 Chronic kidney disease, stage 3 (moderate): Secondary | ICD-10-CM | POA: Diagnosis present

## 2019-05-06 DIAGNOSIS — D638 Anemia in other chronic diseases classified elsewhere: Secondary | ICD-10-CM

## 2019-05-06 LAB — FERRITIN: Ferritin: 156 ng/mL (ref 11–307)

## 2019-05-06 LAB — CBC WITH DIFFERENTIAL (CANCER CENTER ONLY)
Abs Immature Granulocytes: 0.03 10*3/uL (ref 0.00–0.07)
Basophils Absolute: 0 10*3/uL (ref 0.0–0.1)
Basophils Relative: 0 %
Eosinophils Absolute: 0.2 10*3/uL (ref 0.0–0.5)
Eosinophils Relative: 3 %
HCT: 31.7 % — ABNORMAL LOW (ref 36.0–46.0)
Hemoglobin: 10.1 g/dL — ABNORMAL LOW (ref 12.0–15.0)
Immature Granulocytes: 0 %
Lymphocytes Relative: 26 %
Lymphs Abs: 1.9 10*3/uL (ref 0.7–4.0)
MCH: 28.1 pg (ref 26.0–34.0)
MCHC: 31.9 g/dL (ref 30.0–36.0)
MCV: 88.3 fL (ref 80.0–100.0)
Monocytes Absolute: 1 10*3/uL (ref 0.1–1.0)
Monocytes Relative: 13 %
Neutro Abs: 4.3 10*3/uL (ref 1.7–7.7)
Neutrophils Relative %: 58 %
Platelet Count: 349 10*3/uL (ref 150–400)
RBC: 3.59 MIL/uL — ABNORMAL LOW (ref 3.87–5.11)
RDW: 13.9 % (ref 11.5–15.5)
WBC Count: 7.5 10*3/uL (ref 4.0–10.5)
nRBC: 0 % (ref 0.0–0.2)

## 2019-05-06 LAB — IRON AND TIBC
Iron: 62 ug/dL (ref 41–142)
Saturation Ratios: 24 % (ref 21–57)
TIBC: 259 ug/dL (ref 236–444)
UIBC: 197 ug/dL (ref 120–384)

## 2019-05-06 MED ORDER — EPOETIN ALFA-EPBX 10000 UNIT/ML IJ SOLN
20000.0000 [IU] | Freq: Once | INTRAMUSCULAR | Status: AC
  Administered 2019-05-06: 20000 [IU] via SUBCUTANEOUS
  Filled 2019-05-06: qty 2

## 2019-05-06 NOTE — Patient Instructions (Signed)
Epoetin Alfa injection °What is this medicine? °EPOETIN ALFA (e POE e tin AL fa) helps your body make more red blood cells. This medicine is used to treat anemia caused by chronic kidney disease, cancer chemotherapy, or HIV-therapy. It may also be used before surgery if you have anemia. °This medicine may be used for other purposes; ask your health care provider or pharmacist if you have questions. °COMMON BRAND NAME(S): Epogen, Procrit, Retacrit °What should I tell my health care provider before I take this medicine? °They need to know if you have any of these conditions: °-cancer °-heart disease °-high blood pressure °-history of blood clots °-history of stroke °-low levels of folate, iron, or vitamin B12 in the blood °-seizures °-an unusual or allergic reaction to erythropoietin, albumin, benzyl alcohol, hamster proteins, other medicines, foods, dyes, or preservatives °-pregnant or trying to get pregnant °-breast-feeding °How should I use this medicine? °This medicine is for injection into a vein or under the skin. It is usually given by a health care professional in a hospital or clinic setting. °If you get this medicine at home, you will be taught how to prepare and give this medicine. Use exactly as directed. Take your medicine at regular intervals. Do not take your medicine more often than directed. °It is important that you put your used needles and syringes in a special sharps container. Do not put them in a trash can. If you do not have a sharps container, call your pharmacist or healthcare provider to get one. °A special MedGuide will be given to you by the pharmacist with each prescription and refill. Be sure to read this information carefully each time. °Talk to your pediatrician regarding the use of this medicine in children. While this drug may be prescribed for selected conditions, precautions do apply. °Overdosage: If you think you have taken too much of this medicine contact a poison control center  or emergency room at once. °NOTE: This medicine is only for you. Do not share this medicine with others. °What if I miss a dose? °If you miss a dose, take it as soon as you can. If it is almost time for your next dose, take only that dose. Do not take double or extra doses. °What may interact with this medicine? °Interactions have not been studied. °This list may not describe all possible interactions. Give your health care provider a list of all the medicines, herbs, non-prescription drugs, or dietary supplements you use. Also tell them if you smoke, drink alcohol, or use illegal drugs. Some items may interact with your medicine. °What should I watch for while using this medicine? °Your condition will be monitored carefully while you are receiving this medicine. °You may need blood work done while you are taking this medicine. °This medicine may cause a decrease in vitamin B6. You should make sure that you get enough vitamin B6 while you are taking this medicine. Discuss the foods you eat and the vitamins you take with your health care professional. °What side effects may I notice from receiving this medicine? °Side effects that you should report to your doctor or health care professional as soon as possible: °-allergic reactions like skin rash, itching or hives, swelling of the face, lips, or tongue °-seizures °-signs and symptoms of a blood clot such as breathing problems; changes in vision; chest pain; severe, sudden headache; pain, swelling, warmth in the leg; trouble speaking; sudden numbness or weakness of the face, arm or leg °-signs and symptoms of a stroke   like changes in vision; confusion; trouble speaking or understanding; severe headaches; sudden numbness or weakness of the face, arm or leg; trouble walking; dizziness; loss of balance or coordination °Side effects that usually do not require medical attention (report to your doctor or health care professional if they continue or are  bothersome): °-chills °-cough °-dizziness °-fever °-headaches °-joint pain °-muscle cramps °-muscle pain °-nausea, vomiting °-pain, redness, or irritation at site where injected °This list may not describe all possible side effects. Call your doctor for medical advice about side effects. You may report side effects to FDA at 1-800-FDA-1088. °Where should I keep my medicine? °Keep out of the reach of children. °Store in a refrigerator between 2 and 8 degrees C (36 and 46 degrees F). Do not freeze or shake. Throw away any unused portion if using a single-dose vial. Multi-dose vials can be kept in the refrigerator for up to 21 days after the initial dose. Throw away unused medicine. °NOTE: This sheet is a summary. It may not cover all possible information. If you have questions about this medicine, talk to your doctor, pharmacist, or health care provider. °© 2019 Elsevier/Gold Standard (2017-05-25 08:35:19) ° °

## 2019-05-09 ENCOUNTER — Encounter (HOSPITAL_COMMUNITY): Payer: Self-pay

## 2019-05-09 ENCOUNTER — Ambulatory Visit (HOSPITAL_COMMUNITY)
Admission: EM | Admit: 2019-05-09 | Discharge: 2019-05-09 | Disposition: A | Discharge status: Home / Self Care | Payer: Medicare Other | Attending: Family Medicine | Admitting: Family Medicine

## 2019-05-09 DIAGNOSIS — M79675 Pain in left toe(s): Secondary | ICD-10-CM

## 2019-05-09 NOTE — Discharge Instructions (Signed)
Please try stopping the treatment your are applying to your toe currently  Please try soaking in water and soap in the interim.  Please follow up if your symptoms fail to improve.

## 2019-05-09 NOTE — ED Provider Notes (Signed)
[facility]    CSN: [id number] Arrival date & time: 05/09/19  1617     History   Chief Complaint Chief Complaint  Patient presents with  . Foot Swelling    two big toes     HPI Elizabeth Olsen is a 78 y.o. female. She is presenting with left great toe pain. Pain starting yesterday. Localized to the proximal nail fold. No swelling. Pain is intermittent. No trauma or injury. Has been treating her toes for fungus. Pain is sharp and stabbing.   HPI  Past Medical History:  Diagnosis Date  . Anemia   . Blood transfusion   . Blood transfusion without reported diagnosis    with breast surgery  . Breast cancer ([facility]) 15 years ago   left   . CHF (congestive heart failure) ([facility])   . 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 Diabetes mellitus    dx 1998.  was told prior to getting chemo that her bld sugar rose.  She thought it would go back  . Dyspnea    d/t anemia  . GERD (gastroesophageal reflux disease)   . Heart murmur    as child  . Hernia, incisional   . Hyperlipidemia   . Hypertension   . Hypothyroidism   . Vitamin D deficiency     Patient Active Problem List   Diagnosis Date Noted  . Essential hypertension 09/11/2018  . Near syncope 04/01/2018  . 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 Anemia of chronic disease 12/10/2017  . Refractory anemia, unspecified ([facility]) 12/10/2017  . Anemia 11/07/2017  . Acute systolic heart failure ([facility]) 10/13/2017  . Acute respiratory failure with hypoxia ([facility] [facility]) 10/13/2017  . Hypothyroid 10/13/2017  . Anemia associated with diabetes mellitus ([facility]) 10/13/2017  . Diabetes mellitus ([place]) 07/23/2017  . Hyperlipidemia 07/23/2017  . Iron deficiency 07/23/2017  . Cardiomyopathy ([facility]) 07/23/2017    Past Surgical History:  Procedure Laterality Date  . ABDOMINAL HYSTERECTOMY    . APPENDECTOMY    . BREAST SURGERY    . CHOLECYSTECTOMY  01/22/2018   LAPROSCOPIC   . CHOLECYSTECTOMY N/A 01/22/2018   Procedure: LAPAROSCOPIC  CHOLECYSTECTOMY WITH INTRAOPERATIVE CHOLANGIOGRAM;  Surgeon: Donnie Mesa, MD;  Location: [facility];  Service: General;  Laterality: N/A;  . COLONOSCOPY  2013   due next 03-2017  . EYE SURGERY     bil cataracts  . IUD REMOVAL     with appendectomy  . MASTECTOMY, MODIFIED RADICAL W/RECONSTRUCTION Left 15 years ago   10 nodes out  . TUMOR EXCISION Left    x 2, neck, head    OB History   No obstetric history on file.      Home Medications    Prior to Admission medications   Medication Sig Start Date End Date Taking? Authorizing Provider  atorvastatin (LIPITOR) 10 MG tablet TAKE 1 TABLET BY MOUTH EVERY DAY ONE TIME ONLY Patient taking differently: Take 10 mg by mouth daily.  08/20/18   Park Liter, MD  calcium carbonate (OSCAL) 1500 (600 Ca) MG TABS tablet Take 600 mg by mouth daily with breakfast.     [provider]  carvedilol (COREG) 6.25 MG tablet Take 1 tablet (6.25 mg total) by mouth 2 (two) times daily. 01/30/19   Park Liter, MD  fenofibrate 160 MG tablet Take 160 mg by mouth daily.    [provider]  fish oil-omega-3 fatty acids 1000 MG capsule Take 1 g by mouth daily.  [provider]  furosemide (LASIX) 20 MG tablet TAKE 2 TABLETS(40 MG) BY MOUTH DAILY 02/27/19   Park Liter, MD  Insulin Glargine (LANTUS SOLOSTAR) 100 UNIT/ML Solostar Pen Inject 8 Units into the skin 2 (two) times daily.     [provider]  insulin lispro (HUMALOG KWIKPEN) 100 UNIT/ML KiwkPen Inject 5-20 Units into the skin 3 (three) times daily. Sliding Scale    [provider]  iron polysaccharides (NIFEREX) 150 MG capsule Take 150 mg by mouth 2 (two) times daily.     [provider]  levothyroxine (SYNTHROID, LEVOTHROID) 112 MCG tablet Take 112 mcg by mouth See admin instructions. Take 1 tablet (112 mcg) by mouth on 6 days of the week; hold on Sundays.    [provider]  losartan (COZAAR) 25 MG tablet Take 1 tablet (25  mg total) by mouth daily. 02/19/19 05/20/19  Park Liter, MD  Menthol-Methyl Salicylate (MUSCLE RUB) 10-15 % CREA Apply 1 application topically 4 (four) times daily as needed for muscle pain.     [provider]  ONETOUCH VERIO test strip USE TO TEST UP TO 10 TIMES D 08/24/17   [provider]  vitamin C (ASCORBIC ACID) 500 MG tablet Take 500 mg by mouth daily.    [provider]  Vitamin D, Ergocalciferol, (DRISDOL) 50000 UNITS CAPS Take 50,000 Units by mouth 2 (two) times a week. Sundays & Wednesdays. 02/20/12   [provider]    Family History Family History  Problem Relation Age of Onset  . Diabetes Other        both sides of family  . Hypertension Father   . Congestive Heart Failure Father   . Peripheral vascular disease Father   . Hypertension Maternal Grandfather   . Hypertension Maternal Grandmother   . Stomach cancer Maternal Grandmother        GGM  . Heart attack Brother   . Colon cancer Neg Hx   . Esophageal cancer Neg Hx   . Pancreatic cancer Neg Hx   . Rectal cancer Neg Hx     Social History Social History   Tobacco Use  . Smoking status: Never Smoker  . Smokeless tobacco: Never Used  Substance Use Topics  . Alcohol use: No  . Drug use: No     Allergies   Erythromycin, Penicillins, Nyquil [pseudoeph-doxylamine-dm-apap], and Sulfa antibiotics   Review of Systems Review of Systems  Constitutional: Negative for fever.  HENT: Negative for congestion.   Respiratory: Negative for cough.   Cardiovascular: Negative for chest pain.  Gastrointestinal: Negative for abdominal pain.  Musculoskeletal: Negative for joint swelling.  Skin: Negative for color change.  Neurological: Negative for weakness.  Hematological: Negative for adenopathy.     Physical Exam Triage Vital Signs ED Triage Vitals [05/09/19 1644]  Enc Vitals Group     BP (!) 165/73     Pulse Rate 87     Resp 16     Temp 98.1 F (36.7 C)     Temp  Source Oral     SpO2 99 %     Weight      Height      Head Circumference      Peak Flow      Pain Score 0     Pain Loc      Pain Edu?      Excl. in [place]?    No data found.  Updated Vital Signs BP (!) 165/73 (BP Location:  Right Arm)   Pulse 87   Temp 98.1 F (36.7 C) (Oral)   Resp 16   SpO2 99%   Visual Acuity Right Eye Distance:   Left Eye Distance:   Bilateral Distance:    Right Eye Near:   Left Eye Near:    Bilateral Near:     Physical Exam Gen: NAD, alert, cooperative with exam, well-appearing ENT: normal lips, normal nasal mucosa,  Eye: normal EOM, normal conjunctiva and lids CV:  no edema, +2 pedal pulses   Resp: no accessory muscle use, non-labored,   Skin: no rashes, no areas of induration  Neuro: normal tone, normal sensation to touch Psych:  normal insight, alert and oriented MSK:  Left great toe:  No swelling  No TTP of the proximal nail fold  Normal ROM  Chronic nail changes.  Neurovascularly intact   UC Treatments / Results  Labs (all labs ordered are listed, but only abnormal results are displayed) Labs Reviewed - No data to display  EKG   Radiology No results found.  Procedures Procedures (including critical care time)  Medications Ordered in UC Medications - No data to display  Initial Impression / Assessment and Plan / UC Course  I have reviewed the triage vital signs and the nursing notes.  Pertinent labs & imaging results that were available during my care of the patient were reviewed by me and considered in my medical decision making (see chart for details).     Ms. Pellman is a 78 yo F that is presenting with left great toe pain. Likely related to irritation.  Does not demonstrate any suggestion of the paronychia.  Has not had an injury or trauma.  Counseled on supportive care.  Given indications to return and follow-up.  Final Clinical Impressions(s) / UC Diagnoses   Final diagnoses:  Great toe pain, left     Discharge  Instructions     Please try stopping the treatment your are applying to your toe currently  Please try soaking in water and soap in the interim.  Please follow up if your symptoms fail to improve.     ED Prescriptions    None     Controlled Substance Prescriptions [place] Controlled Substance Registry consulted? Not Applicable   Rosemarie Ax, MD 05/09/19 1710

## 2019-05-09 NOTE — ED Triage Notes (Signed)
Pt has been treating both of her big toes with some antifungal liquid. But the big toes has gotten worst from the treatment and has begin to swell.

## 2019-05-14 NOTE — Progress Notes (Signed)
[place]   [phone] Fax:(336) [phone]   Clinic Follow up Note   Patient Care Team: [name], MD as PCP - General (Family Medicine) Park Liter, MD as PCP - Cardiology (Cardiology) Reynold Bowen, MD as Consulting Physician (Endocrinology) Bobbye Charleston, MD as Consulting Physician (Obstetrics and Gynecology)  Date of Service:  05/21/2019  CHIEF COMPLAINT: F/u anemia of chronic disease    CURRENT THERAPY:  Aranesp injection 163mg monthly started on 01/01/18. Due to insurance was changed to Retacrit every 2 weeks starting 03/11/19.   INTERVAL HISTORY:  Elizabeth RUPERTis here for a follow up anemia. She presents to the clinic alone. She notes she is doing well. She notes her husband is stick with stage IV lung cancer so she has been trying to not bring anything back to him. She has been tolerating Retacrit well. Since starting injections she feels a little better but still feels tired. She notes her Potassium was high but her oral potassium was not stopped.  She notes significant ankle swelling, R>L. She takes Lasix.    REVIEW OF SYSTEMS:   Constitutional: Denies fevers, chills or abnormal weight loss (+) fatigue  Eyes: Denies blurriness of vision Ears, nose, mouth, throat, and face: Denies mucositis or sore throat Respiratory: Denies cough, dyspnea or wheezes Cardiovascular: Denies palpitation, chest discomfort (+) lower extremity swelling, R>L Gastrointestinal:  Denies nausea, heartburn or change in bowel habits Skin: Denies abnormal skin rashes Lymphatics: Denies new lymphadenopathy or easy bruising Neurological:Denies numbness, tingling or new weaknesses Behavioral/Psych: Mood is stable, no new changes  All other systems were reviewed with the patient and are negative.  MEDICAL HISTORY:  Past Medical History:  Diagnosis Date  . Anemia   . Blood transfusion   . Blood transfusion without reported diagnosis    with breast  surgery  [place]   [place]   [place]   . 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 Diabetes mellitus    dx 1998.  was told prior to getting chemo that her bld sugar rose.  She thought it would go back  . Dyspnea    d/t anemia  . GERD (gastroesophageal reflux disease)   . Heart murmur    as child  . Hernia, incisional   . Hyperlipidemia   . Hypertension   . Hypothyroidism   . Vitamin D deficiency     SURGICAL HISTORY: Past Surgical History:  Procedure Laterality Date  . ABDOMINAL HYSTERECTOMY    . APPENDECTOMY    . BREAST SURGERY    . CHOLECYSTECTOMY  01/22/2018   LAPROSCOPIC   . CHOLECYSTECTOMY N/A 01/22/2018   Procedure: LAPAROSCOPIC CHOLECYSTECTOMY WITH INTRAOPERATIVE CHOLANGIOGRAM;  Surgeon: TDonnie Mesa MD;  [facility]  Service: General;  Laterality: N/A;  . COLONOSCOPY  2013   due next 03-2017  . EYE SURGERY     bil cataracts  . IUD REMOVAL     with appendectomy  . MASTECTOMY, MODIFIED RADICAL W/RECONSTRUCTION Left 15 years ago   10 nodes out  . TUMOR EXCISION Left    x 2, neck, head    I have reviewed the social history and family history with the patient and they are unchanged from previous note.  ALLERGIES:  is allergic to erythromycin; penicillins; nyquil [pseudoeph-doxylamine-dm-apap]; and sulfa antibiotics.  MEDICATIONS:  Current Outpatient Medications  Medication Sig Dispense Refill  . atorvastatin (LIPITOR) 10 MG tablet TAKE 1 TABLET BY MOUTH  EVERY DAY ONE TIME ONLY (Patient taking differently: Take 10 mg by mouth daily. ) 90 tablet 2  . calcium carbonate (OSCAL) 1500 (600 Ca) MG TABS tablet Take 600 mg by mouth daily with breakfast.     . carvedilol (COREG) 6.25 MG tablet Take 1 tablet (6.25 mg total) by mouth 2 (two) times daily. 180 tablet 1  . fenofibrate 160 MG tablet Take 160 mg by mouth daily.    . fish oil-omega-3 fatty acids 1000 MG capsule Take 1 g by mouth daily.     . furosemide  (LASIX) 20 MG tablet TAKE 2 TABLETS(40 MG) BY MOUTH DAILY 90 tablet 1  . Insulin Glargine (LANTUS SOLOSTAR) 100 UNIT/ML Solostar Pen Inject 8 Units into the skin 2 (two) times daily.     . insulin lispro (HUMALOG KWIKPEN) 100 UNIT/ML KiwkPen Inject 5-20 Units into the skin 3 (three) times daily. Sliding Scale    . iron polysaccharides (NIFEREX) 150 MG capsule Take 150 mg by mouth 2 (two) times daily.     Marland Kitchen levothyroxine (SYNTHROID, LEVOTHROID) 112 MCG tablet Take 112 mcg by mouth See admin instructions. Take 1 tablet (112 mcg) by mouth on 6 days of the week; hold on Sundays.    . Menthol-Methyl Salicylate (MUSCLE RUB) 10-15 % CREA Apply 1 application topically 4 (four) times daily as needed for muscle pain.     Glory Rosebush VERIO test strip USE TO TEST UP TO 10 TIMES D  12  . vitamin C (ASCORBIC ACID) 500 MG tablet Take 500 mg by mouth daily.    . Vitamin D, Ergocalciferol, (DRISDOL) 50000 UNITS CAPS Take 50,000 Units by mouth 2 (two) times a week. Sundays & Wednesdays.    Marland Kitchen losartan (COZAAR) 25 MG tablet Take 1 tablet (25 mg total) by mouth daily. 90 tablet 3   No current facility-administered medications for this visit.     PHYSICAL EXAMINATION: ECOG PERFORMANCE STATUS: 1 - Symptomatic but completely ambulatory  Vitals:   05/21/19 1041  BP: (!) 119/59  Pulse: 80  Resp: 17  Temp: 97.8 F (36.6 C)  SpO2: 100%   Filed Weights   05/21/19 1041  Weight: 152 lb (68.9 kg)    GENERAL:alert, no distress and comfortable SKIN: skin color, texture, turgor are normal, no rashes or significant lesions EYES: normal, Conjunctiva are pink and non-injected, sclera clear  NECK: supple, thyroid normal size, non-tender, without nodularity LYMPH:  no palpable lymphadenopathy in the cervical, axillary  LUNGS: clear to auscultation and percussion with normal breathing effort HEART: regular rate & rhythm and no murmurs (+) Moderate lower extremity edema, R>L ABDOMEN:abdomen soft, non-tender and normal  bowel sounds Musculoskeletal:no cyanosis of digits and no clubbing  NEURO: alert & oriented x 3 with fluent speech, no focal motor/sensory deficits  LABORATORY DATA:  I have reviewed the data as listed CBC Latest Ref Rng & Units 05/21/2019 05/06/2019 04/22/2019  WBC 4.0 - 10.5 K/uL 6.6 7.5 6.9  Hemoglobin 12.0 - 15.0 g/dL 10.0(L) 10.1(L) 9.5(L)  Hematocrit 36.0 - 46.0 % 32.1(L) 31.7(L) 30.2(L)  Platelets 150 - 400 K/uL 377 349 329     CMP Latest Ref Rng & Units 03/03/2019 09/04/2018 09/04/2018  Glucose 70 - 99 mg/dL 105(H) 478(H) 694(HH)  BUN 8 - 23 mg/dL 30(H) 42(H) 38(H)  Creatinine 0.44 - 1.00 mg/dL 1.31(H) 1.23(H) 1.60(H)  Sodium 135 - 145 mmol/L 139 133(L) 133(L)  Potassium 3.5 - 5.1 mmol/L 5.5(H) 4.6 5.1  Chloride 98 - 111 mmol/L 99 93(L) 97(L)  CO2 22 - 32 mmol/L 33(H) 30 27  Calcium 8.9 - 10.3 mg/dL 9.1 9.6 9.4  Total Protein 6.5 - 8.1 g/dL 6.4(L) - 6.9  Total Bilirubin 0.3 - 1.2 mg/dL <0.2(L) - 0.2(L)  Alkaline Phos 38 - 126 U/L 72 - 56  AST 15 - 41 U/L 25 - 21  ALT 0 - 44 U/L 23 - 14      RADIOGRAPHIC STUDIES: I have personally reviewed the radiological images as listed and agreed with the findings in the report. No results found.   ASSESSMENT & PLAN:  KERIE BADGER is a 78 y.o. female with   1. Anemia of chronic diseases  -I previously reviewed her previous CBC and anemia workup, she had normal ferritin, iron study, H29 and folic acid in 9242, ret was low, normal MCV.  No lab evidence for hemolysis or multiple myeloma.  Her erythropoietin level was normal. -She does have chronic disease including uncontrolled diabetes, congestive heart failure, and mild CKD, which can cause anemia of chronic disease -her bone marrow biopsy in 11/21/17 showed no evidence of MDS or other primary marrow disease.  I think this is consistent with anemia of chronic disease.  -Since her Hgb is below 9 most of time, she has started monthly Aranasp injection in 12/2017, she is tolerating well. Due  to insurance her Aranesp was changed to IV Retacrit every 2 weeks on 03/11/19 with goal Hg 10-11. We discussed the risk of thrombosis from above, especially when hemoglobin is above 11. -Last dose Retacrit on 05/06/19.  -Labs reviewed, CBC WNL except hg 10. Iron panel and Ferritin are still pending. Will continue current regimen of lab and injection q2weeks. Will proceed with Retacrit today.  -F/u in 4 months   2. CHF and uncontrolled hypertension  -Pt sees a cardiologist, Dr. Agustin Cree regularly. She takes 40 mg Lasix and potassium.  -Her blood pressure is very high, I strongly encouraged her to follow-up with her cardiologist -BP at 119/59 today (05/21/19)  3. DM with hyperglycemia -She had severe hyperglycemia on 09/05/19 with BG 694.  -She will continue to f/u with Dr. Forde Dandy for management   4. Stage III CKD -Likely secondary to her diabetes.   -I previously advised her to drink plenty of fluids and to control her diabetes.   PLAN: -Labs reviewed, proceed with Retacrit today  -Continue Lab and Retacrit injection every 2 weeks  -F/u in 4 months   No problem-specific Assessment & Plan notes found for this encounter.   No orders of the defined types were placed in this encounter.  All questions were answered. The patient knows to call the clinic with any problems, questions or concerns. No barriers to learning was detected. I spent 10 minutes counseling the patient face to face. The total time spent in the appointment was 15 minutes and more than 50% was on counseling and review of test results     Truitt Merle, MD 05/21/2019   I, Joslyn Devon, am acting as scribe for Truitt Merle, MD.   I have reviewed the above documentation for accuracy and completeness, and I agree with the above.

## 2019-05-21 ENCOUNTER — Inpatient Hospital Stay: Payer: Medicare Other

## 2019-05-21 ENCOUNTER — Other Ambulatory Visit: Payer: Self-pay

## 2019-05-21 ENCOUNTER — Inpatient Hospital Stay (HOSPITAL_BASED_OUTPATIENT_CLINIC_OR_DEPARTMENT_OTHER): Payer: Medicare Other | Admitting: Hematology

## 2019-05-21 ENCOUNTER — Encounter: Payer: Self-pay | Admitting: Hematology

## 2019-05-21 VITALS — BP 119/59 | HR 80 | Temp 97.8°F | Resp 17 | Ht 62.5 in | Wt 152.0 lb

## 2019-05-21 DIAGNOSIS — I130 Hypertensive heart and chronic kidney disease with heart failure and stage 1 through stage 4 chronic kidney disease, or unspecified chronic kidney disease: Secondary | ICD-10-CM

## 2019-05-21 DIAGNOSIS — D638 Anemia in other chronic diseases classified elsewhere: Secondary | ICD-10-CM

## 2019-05-21 DIAGNOSIS — E1165 Type 2 diabetes mellitus with hyperglycemia: Secondary | ICD-10-CM

## 2019-05-21 DIAGNOSIS — E1122 Type 2 diabetes mellitus with diabetic chronic kidney disease: Secondary | ICD-10-CM | POA: Diagnosis not present

## 2019-05-21 DIAGNOSIS — N183 Chronic kidney disease, stage 3 (moderate): Secondary | ICD-10-CM | POA: Diagnosis not present

## 2019-05-21 DIAGNOSIS — I509 Heart failure, unspecified: Secondary | ICD-10-CM

## 2019-05-21 LAB — CBC WITH DIFFERENTIAL (CANCER CENTER ONLY)
Abs Immature Granulocytes: 0.02 10*3/uL (ref 0.00–0.07)
Basophils Absolute: 0 10*3/uL (ref 0.0–0.1)
Basophils Relative: 1 %
Eosinophils Absolute: 0.2 10*3/uL (ref 0.0–0.5)
Eosinophils Relative: 3 %
HCT: 32.1 % — ABNORMAL LOW (ref 36.0–46.0)
Hemoglobin: 10 g/dL — ABNORMAL LOW (ref 12.0–15.0)
Immature Granulocytes: 0 %
Lymphocytes Relative: 28 %
Lymphs Abs: 1.8 10*3/uL (ref 0.7–4.0)
MCH: 28.2 pg (ref 26.0–34.0)
MCHC: 31.2 g/dL (ref 30.0–36.0)
MCV: 90.4 fL (ref 80.0–100.0)
Monocytes Absolute: 0.8 10*3/uL (ref 0.1–1.0)
Monocytes Relative: 13 %
Neutro Abs: 3.7 10*3/uL (ref 1.7–7.7)
Neutrophils Relative %: 55 %
Platelet Count: 377 10*3/uL (ref 150–400)
RBC: 3.55 MIL/uL — ABNORMAL LOW (ref 3.87–5.11)
RDW: 14.3 % (ref 11.5–15.5)
WBC Count: 6.6 10*3/uL (ref 4.0–10.5)
nRBC: 0 % (ref 0.0–0.2)

## 2019-05-21 LAB — IRON AND TIBC
Iron: 58 ug/dL (ref 41–142)
Saturation Ratios: 20 % — ABNORMAL LOW (ref 21–57)
TIBC: 288 ug/dL (ref 236–444)
UIBC: 230 ug/dL (ref 120–384)

## 2019-05-21 LAB — FERRITIN: Ferritin: 144 ng/mL (ref 11–307)

## 2019-05-21 MED ORDER — EPOETIN ALFA-EPBX 10000 UNIT/ML IJ SOLN
20000.0000 [IU] | Freq: Once | INTRAMUSCULAR | Status: AC
  Administered 2019-05-21: 20000 [IU] via SUBCUTANEOUS
  Filled 2019-05-21: qty 2

## 2019-05-21 NOTE — Patient Instructions (Signed)
Epoetin Alfa injection What is this medicine? EPOETIN ALFA (e POE e tin AL fa) helps your body make more red blood cells. This medicine is used to treat anemia caused by chronic kidney disease, cancer chemotherapy, or HIV-therapy. It may also be used before surgery if you have anemia. This medicine may be used for other purposes; ask your health care provider or pharmacist if you have questions. COMMON BRAND NAME(S): Epogen, Procrit, Retacrit What should I tell my health care provider before I take this medicine? They need to know if you have any of these conditions:  cancer  heart disease  high blood pressure  history of blood clots  history of stroke  low levels of folate, iron, or vitamin B12 in the blood  seizures  an unusual or allergic reaction to erythropoietin, albumin, benzyl alcohol, hamster proteins, other medicines, foods, dyes, or preservatives  pregnant or trying to get pregnant  breast-feeding How should I use this medicine? This medicine is for injection into a vein or under the skin. It is usually given by a health care professional in a hospital or clinic setting. If you get this medicine at home, you will be taught how to prepare and give this medicine. Use exactly as directed. Take your medicine at regular intervals. Do not take your medicine more often than directed. It is important that you put your used needles and syringes in a special sharps container. Do not put them in a trash can. If you do not have a sharps container, call your pharmacist or healthcare provider to get one. A special MedGuide will be given to you by the pharmacist with each prescription and refill. Be sure to read this information carefully each time. Talk to your pediatrician regarding the use of this medicine in children. While this drug may be prescribed for selected conditions, precautions do apply. Overdosage: If you think you have taken too much of this medicine contact a poison  control center or emergency room at once. NOTE: This medicine is only for you. Do not share this medicine with others. What if I miss a dose? If you miss a dose, take it as soon as you can. If it is almost time for your next dose, take only that dose. Do not take double or extra doses. What may interact with this medicine? Interactions have not been studied. This list may not describe all possible interactions. Give your health care provider a list of all the medicines, herbs, non-prescription drugs, or dietary supplements you use. Also tell them if you smoke, drink alcohol, or use illegal drugs. Some items may interact with your medicine. What should I watch for while using this medicine? Your condition will be monitored carefully while you are receiving this medicine. You may need blood work done while you are taking this medicine. This medicine may cause a decrease in vitamin B6. You should make sure that you get enough vitamin B6 while you are taking this medicine. Discuss the foods you eat and the vitamins you take with your health care professional. What side effects may I notice from receiving this medicine? Side effects that you should report to your doctor or health care professional as soon as possible:  allergic reactions like skin rash, itching or hives, swelling of the face, lips, or tongue  seizures  signs and symptoms of a blood clot such as breathing problems; changes in vision; chest pain; severe, sudden headache; pain, swelling, warmth in the leg; trouble speaking; sudden numbness or   weakness of the face, arm or leg  signs and symptoms of a stroke like changes in vision; confusion; trouble speaking or understanding; severe headaches; sudden numbness or weakness of the face, arm or leg; trouble walking; dizziness; loss of balance or coordination Side effects that usually do not require medical attention (report to your doctor or health care professional if they continue or are  bothersome):  chills  cough  dizziness  fever  headaches  joint pain  muscle cramps  muscle pain  nausea, vomiting  pain, redness, or irritation at site where injected This list may not describe all possible side effects. Call your doctor for medical advice about side effects. You may report side effects to FDA at 1-800-FDA-1088. Where should I keep my medicine? Keep out of the reach of children. Store in a refrigerator between 2 and 8 degrees C (36 and 46 degrees F). Do not freeze or shake. Throw away any unused portion if using a single-dose vial. Multi-dose vials can be kept in the refrigerator for up to 21 days after the initial dose. Throw away unused medicine. NOTE: This sheet is a summary. It may not cover all possible information. If you have questions about this medicine, talk to your doctor, pharmacist, or health care provider.  2020 Elsevier/Gold Standard (2017-05-25 08:35:19) Coronavirus (COVID-19) Are you at risk?  Are you at risk for the Coronavirus (COVID-19)?  To be considered HIGH RISK for Coronavirus (COVID-19), you have to meet the following criteria:  . Traveled to [place], [place], [place], [place] or [place]; or in the [place] to [place], [place], [place], or [place]; and have fever, cough, and shortness of breath within the last 2 weeks of travel OR . Been in close contact with a person diagnosed with COVID-19 within the last 2 weeks and have fever, cough, and shortness of breath . IF YOU DO NOT MEET THESE CRITERIA, YOU ARE CONSIDERED LOW RISK FOR COVID-19.  What to do if you are HIGH RISK for COVID-19?  . If you are having a medical emergency, call 911. . Seek medical care right away. Before you go to a doctor's office, urgent care or emergency department, call ahead and tell them about your recent travel, contact with someone diagnosed with COVID-19, and your symptoms. You should receive instructions from your physician's office regarding  next steps of care.  . When you arrive at healthcare provider, tell the healthcare staff immediately you have returned from visiting [place], [place], [place], [place] or [place]; or traveled in the [place] to [place], [place], [place], or [place]; in the last two weeks or you have been in close contact with a person diagnosed with COVID-19 in the last 2 weeks.   . Tell the health care staff about your symptoms: fever, cough and shortness of breath. . After you have been seen by a medical provider, you will be either: o Tested for (COVID-19) and discharged home on quarantine except to seek medical care if symptoms worsen, and asked to  - Stay home and avoid contact with others until you get your results (4-5 days)  - Avoid travel on public transportation if possible (such as bus, train, or airplane) or o Sent to the Emergency Department by EMS for evaluation, COVID-19 testing, and possible admission depending on your condition and test results.  What to do if you are LOW RISK for COVID-19?  Reduce your risk of any infection by using the same precautions used for   avoiding the common cold or flu:  . Wash your hands often with soap and warm water for at least 20 seconds.  If soap and water are not readily available, use an alcohol-based hand sanitizer with at least 60% alcohol.  . If coughing or sneezing, cover your mouth and nose by coughing or sneezing into the elbow areas of your shirt or coat, into a tissue or into your sleeve (not your hands). . Avoid shaking hands with others and consider head nods or verbal greetings only. . Avoid touching your eyes, nose, or mouth with unwashed hands.  . Avoid close contact with people who are sick. . Avoid places or events with large numbers of people in one location, like concerts or sporting events. . Carefully consider travel plans you have or are making. . If you are planning any travel outside or inside the [place], visit the CDC's Travelers'  Health webpage for the latest health notices. . If you have some symptoms but not all symptoms, continue to monitor at home and seek medical attention if your symptoms worsen. . If you are having a medical emergency, call 911.   ADDITIONAL HEALTHCARE OPTIONS FOR PATIENTS  [facility] / e-Visit: [email or web address]         [facility]: [phone]  [facility]: [phone]                   [facility]: [phone]  

## 2019-05-22 ENCOUNTER — Telehealth: Payer: Self-pay | Admitting: Hematology

## 2019-05-22 ENCOUNTER — Other Ambulatory Visit: Payer: Self-pay | Admitting: Cardiology

## 2019-05-22 NOTE — Telephone Encounter (Signed)
Scheduled appt per 7/22 los.  Spoke with patient and she is aware of her appt date and time.

## 2019-05-23 NOTE — Telephone Encounter (Signed)
Furosemide refill sent 

## 2019-06-02 ENCOUNTER — Other Ambulatory Visit: Payer: Self-pay | Admitting: Cardiology

## 2019-06-04 ENCOUNTER — Inpatient Hospital Stay: Payer: Medicare Other | Attending: Hematology

## 2019-06-04 ENCOUNTER — Inpatient Hospital Stay: Payer: Medicare Other

## 2019-06-04 ENCOUNTER — Other Ambulatory Visit: Payer: Self-pay

## 2019-06-04 VITALS — BP 128/62 | HR 82 | Temp 98.2°F | Resp 17

## 2019-06-04 DIAGNOSIS — E1165 Type 2 diabetes mellitus with hyperglycemia: Secondary | ICD-10-CM | POA: Diagnosis not present

## 2019-06-04 DIAGNOSIS — Z794 Long term (current) use of insulin: Secondary | ICD-10-CM | POA: Diagnosis not present

## 2019-06-04 DIAGNOSIS — D638 Anemia in other chronic diseases classified elsewhere: Secondary | ICD-10-CM | POA: Diagnosis present

## 2019-06-04 LAB — CBC WITH DIFFERENTIAL (CANCER CENTER ONLY)
Abs Immature Granulocytes: 0.01 10*3/uL (ref 0.00–0.07)
Basophils Absolute: 0 10*3/uL (ref 0.0–0.1)
Basophils Relative: 1 %
Eosinophils Absolute: 0.3 10*3/uL (ref 0.0–0.5)
Eosinophils Relative: 4 %
HCT: 33.3 % — ABNORMAL LOW (ref 36.0–46.0)
Hemoglobin: 10.3 g/dL — ABNORMAL LOW (ref 12.0–15.0)
Immature Granulocytes: 0 %
Lymphocytes Relative: 30 %
Lymphs Abs: 1.8 10*3/uL (ref 0.7–4.0)
MCH: 27.7 pg (ref 26.0–34.0)
MCHC: 30.9 g/dL (ref 30.0–36.0)
MCV: 89.5 fL (ref 80.0–100.0)
Monocytes Absolute: 0.9 10*3/uL (ref 0.1–1.0)
Monocytes Relative: 15 %
Neutro Abs: 3.1 10*3/uL (ref 1.7–7.7)
Neutrophils Relative %: 50 %
Platelet Count: 409 10*3/uL — ABNORMAL HIGH (ref 150–400)
RBC: 3.72 MIL/uL — ABNORMAL LOW (ref 3.87–5.11)
RDW: 14.7 % (ref 11.5–15.5)
WBC Count: 6.2 10*3/uL (ref 4.0–10.5)
nRBC: 0 % (ref 0.0–0.2)

## 2019-06-04 LAB — IRON AND TIBC
Iron: 74 ug/dL (ref 41–142)
Saturation Ratios: 24 % (ref 21–57)
TIBC: 313 ug/dL (ref 236–444)
UIBC: 239 ug/dL (ref 120–384)

## 2019-06-04 LAB — FERRITIN: Ferritin: 128 ng/mL (ref 11–307)

## 2019-06-04 MED ORDER — EPOETIN ALFA-EPBX 10000 UNIT/ML IJ SOLN
20000.0000 [IU] | Freq: Once | INTRAMUSCULAR | Status: AC
  Administered 2019-06-04: 20000 [IU] via SUBCUTANEOUS
  Filled 2019-06-04: qty 2

## 2019-06-04 NOTE — Patient Instructions (Signed)
Epoetin Alfa injection °What is this medicine? °EPOETIN ALFA (e POE e tin AL fa) helps your body make more red blood cells. This medicine is used to treat anemia caused by chronic kidney disease, cancer chemotherapy, or HIV-therapy. It may also be used before surgery if you have anemia. °This medicine may be used for other purposes; ask your health care provider or pharmacist if you have questions. °COMMON BRAND NAME(S): Epogen, Procrit, Retacrit °What should I tell my health care provider before I take this medicine? °They need to know if you have any of these conditions: °-cancer °-heart disease °-high blood pressure °-history of blood clots °-history of stroke °-low levels of folate, iron, or vitamin B12 in the blood °-seizures °-an unusual or allergic reaction to erythropoietin, albumin, benzyl alcohol, hamster proteins, other medicines, foods, dyes, or preservatives °-pregnant or trying to get pregnant °-breast-feeding °How should I use this medicine? °This medicine is for injection into a vein or under the skin. It is usually given by a health care professional in a hospital or clinic setting. °If you get this medicine at home, you will be taught how to prepare and give this medicine. Use exactly as directed. Take your medicine at regular intervals. Do not take your medicine more often than directed. °It is important that you put your used needles and syringes in a special sharps container. Do not put them in a trash can. If you do not have a sharps container, call your pharmacist or healthcare provider to get one. °A special MedGuide will be given to you by the pharmacist with each prescription and refill. Be sure to read this information carefully each time. °Talk to your pediatrician regarding the use of this medicine in children. While this drug may be prescribed for selected conditions, precautions do apply. °Overdosage: If you think you have taken too much of this medicine contact a poison control center  or emergency room at once. °NOTE: This medicine is only for you. Do not share this medicine with others. °What if I miss a dose? °If you miss a dose, take it as soon as you can. If it is almost time for your next dose, take only that dose. Do not take double or extra doses. °What may interact with this medicine? °Interactions have not been studied. °This list may not describe all possible interactions. Give your health care provider a list of all the medicines, herbs, non-prescription drugs, or dietary supplements you use. Also tell them if you smoke, drink alcohol, or use illegal drugs. Some items may interact with your medicine. °What should I watch for while using this medicine? °Your condition will be monitored carefully while you are receiving this medicine. °You may need blood work done while you are taking this medicine. °This medicine may cause a decrease in vitamin B6. You should make sure that you get enough vitamin B6 while you are taking this medicine. Discuss the foods you eat and the vitamins you take with your health care professional. °What side effects may I notice from receiving this medicine? °Side effects that you should report to your doctor or health care professional as soon as possible: °-allergic reactions like skin rash, itching or hives, swelling of the face, lips, or tongue °-seizures °-signs and symptoms of a blood clot such as breathing problems; changes in vision; chest pain; severe, sudden headache; pain, swelling, warmth in the leg; trouble speaking; sudden numbness or weakness of the face, arm or leg °-signs and symptoms of a stroke   like changes in vision; confusion; trouble speaking or understanding; severe headaches; sudden numbness or weakness of the face, arm or leg; trouble walking; dizziness; loss of balance or coordination °Side effects that usually do not require medical attention (report to your doctor or health care professional if they continue or are  bothersome): °-chills °-cough °-dizziness °-fever °-headaches °-joint pain °-muscle cramps °-muscle pain °-nausea, vomiting °-pain, redness, or irritation at site where injected °This list may not describe all possible side effects. Call your doctor for medical advice about side effects. You may report side effects to FDA at 1-800-FDA-1088. °Where should I keep my medicine? °Keep out of the reach of children. °Store in a refrigerator between 2 and 8 degrees C (36 and 46 degrees F). Do not freeze or shake. Throw away any unused portion if using a single-dose vial. Multi-dose vials can be kept in the refrigerator for up to 21 days after the initial dose. Throw away unused medicine. °NOTE: This sheet is a summary. It may not cover all possible information. If you have questions about this medicine, talk to your doctor, pharmacist, or health care provider. °© 2019 Elsevier/Gold Standard (2017-05-25 08:35:19) ° °

## 2019-06-14 ENCOUNTER — Ambulatory Visit (HOSPITAL_COMMUNITY)
Admission: EM | Admit: 2019-06-14 | Discharge: 2019-06-14 | Disposition: A | Discharge status: Home / Self Care | Payer: Medicare Other | Attending: Urgent Care | Admitting: Urgent Care

## 2019-06-14 ENCOUNTER — Encounter (HOSPITAL_COMMUNITY): Payer: Self-pay

## 2019-06-14 DIAGNOSIS — S40212A Abrasion of left shoulder, initial encounter: Secondary | ICD-10-CM

## 2019-06-14 DIAGNOSIS — S0083XA Contusion of other part of head, initial encounter: Secondary | ICD-10-CM

## 2019-06-14 DIAGNOSIS — S80212A Abrasion, left knee, initial encounter: Secondary | ICD-10-CM | POA: Diagnosis not present

## 2019-06-14 DIAGNOSIS — W19XXXA Unspecified fall, initial encounter: Secondary | ICD-10-CM

## 2019-06-14 DIAGNOSIS — S0990XA Unspecified injury of head, initial encounter: Secondary | ICD-10-CM | POA: Diagnosis not present

## 2019-06-14 MED ORDER — TRAMADOL HCL 50 MG PO TABS: 50.0000 mg | ORAL_TABLET | Freq: Two times a day (BID) | ORAL | 0 refills | Status: AC | PRN

## 2019-06-14 MED ORDER — MUPIROCIN 2 % EX OINT: 1.0000 "application " | TOPICAL_OINTMENT | Freq: Three times a day (TID) | CUTANEOUS | 0 refills | Status: DC

## 2019-06-14 NOTE — ED Provider Notes (Addendum)
MRN: [id number] DOB: [birth year]  Subjective:   Elizabeth Olsen is a 78 y.o. female presenting for suffering a fall yesterday.  Patient reports that it was accidental, she was in a hurry and was running subsequently slipped and fell hitting the left side of her face and body.  She had a difficult time getting up but eventually EMS arrived and was able to stabilize patient.  She did not need to go to the hospital after their evaluation.  Patient reports that she has had severe swelling of her left cheek and eye, lip swelling from cutting her lip open and bleeding.  She also has scrapes on her left shoulder and left knee.  Patient has kept her wounds clean with soap and water.  She has also used some icing.  urrently she states that he has very minimal pain, none over her face.  She does admit that she does feel pain when she touches her face or her lip.  Patient used to work as an Editor, commissioning, states that she is aware of signs and symptoms of head injury/brain injury.  Denies any confusion, vision changes, dizziness, nausea, vomiting, disorientation, headache.  She does not like to use pain medications.  Has been advised not to use Tylenol due to her allergy to NyQuil.  Has used Advil okay in the past.  Patient is the sole caregiver of her husband undergoing treatment for stage IV lung cancer.  She states that she cannot spend too much time away from him and is very reluctant to report to the emergency room.  She does have a PCP that she sees regularly.  No current facility-administered medications for this encounter.   Current Outpatient Medications:  .  atorvastatin (LIPITOR) 10 MG tablet, TAKE 1 TABLET BY MOUTH EVERY DAY ONE TIME ONLY (Patient taking differently: Take 10 mg by mouth daily. ), Disp: 90 tablet, Rfl: 2 .  calcium carbonate (OSCAL) 1500 (600 Ca) MG TABS tablet, Take 600 mg by mouth daily with breakfast. , Disp: , Rfl:  .  carvedilol (COREG) 6.25 MG tablet, TAKE 1 TABLET BY MOUTH  TWICE DAILY, Disp: 180 tablet, Rfl: 1 .  carvedilol (COREG) 6.25 MG tablet, TAKE 1 TABLET BY MOUTH TWICE DAILY, Disp: 180 tablet, Rfl: 1 .  fenofibrate 160 MG tablet, Take 160 mg by mouth daily., Disp: , Rfl:  .  fish oil-omega-3 fatty acids 1000 MG capsule, Take 1 g by mouth daily. , Disp: , Rfl:  .  furosemide (LASIX) 20 MG tablet, TAKE 3 TABLETS EVERY DAY, Disp: 270 tablet, Rfl: 0 .  Insulin Glargine (LANTUS SOLOSTAR) 100 UNIT/ML Solostar Pen, Inject 8 Units into the skin 2 (two) times daily. , Disp: , Rfl:  .  insulin lispro (HUMALOG KWIKPEN) 100 UNIT/ML KiwkPen, Inject 5-20 Units into the skin 3 (three) times daily. Sliding Scale, Disp: , Rfl:  .  iron polysaccharides (NIFEREX) 150 MG capsule, Take 150 mg by mouth 2 (two) times daily. , Disp: , Rfl:  .  levothyroxine (SYNTHROID, LEVOTHROID) 112 MCG tablet, Take 112 mcg by mouth See admin instructions. Take 1 tablet (112 mcg) by mouth on 6 days of the week; hold on Sundays., Disp: , Rfl:  .  losartan (COZAAR) 25 MG tablet, TAKE 1 TABLET BY MOUTH DAILY, Disp: 90 tablet, Rfl: 1 .  Menthol-Methyl Salicylate (MUSCLE RUB) 10-15 % CREA, Apply 1 application topically 4 (four) times daily as needed for muscle pain. , Disp: , Rfl:  .  mupirocin ointment (BACTROBAN)  2 %, Apply 1 application topically 3 (three) times daily., Disp: 15 g, Rfl: 0 .  ONETOUCH VERIO test strip, USE TO TEST UP TO 10 TIMES D, Disp: , Rfl: 12 .  traMADol (ULTRAM) 50 MG tablet, Take 1 tablet (50 mg total) by mouth every 12 (twelve) hours as needed for up to 5 days for severe pain., Disp: 10 tablet, Rfl: 0 .  vitamin C (ASCORBIC ACID) 500 MG tablet, Take 500 mg by mouth daily., Disp: , Rfl:  .  Vitamin D, Ergocalciferol, (DRISDOL) 50000 UNITS CAPS, Take 50,000 Units by mouth 2 (two) times a week. Sundays & Wednesdays., Disp: , Rfl:     Allergies  Allergen Reactions  . Erythromycin Shortness Of Breath and Swelling    "throat swelling", sob, thrashing ,   . Penicillins Rash     Has patient had a PCN reaction causing immediate rash, facial/tongue/throat swelling, SOB or lightheadedness with hypotension: Unknown Has patient had a PCN reaction causing severe rash involving mucus membranes or skin necrosis: No Has patient had a PCN reaction that required hospitalization: No Has patient had a PCN reaction occurring within the last 10 years: No If all of the above answers are "NO", then may proceed with Cephalosporin use.   Marland Kitchen Nyquil [Pseudoeph-Doxylamine-Dm-Apap] Rash  . Sulfa Antibiotics Rash    Past Medical History:  Diagnosis Date  . Anemia   . Blood transfusion   . Blood transfusion without reported diagnosis    with breast surgery  . Breast cancer ([place]) 15 years ago   left   . CHF (congestive heart failure) ([place])   . 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 Diabetes mellitus    dx 1998.  was told prior to getting chemo that her bld sugar rose.  She thought it would go back  . Dyspnea    d/t anemia  . GERD (gastroesophageal reflux disease)   . Heart murmur    as child  . Hernia, incisional   . Hyperlipidemia   . Hypertension   . Hypothyroidism   . Vitamin D deficiency      Past Surgical History:  Procedure Laterality Date  . ABDOMINAL HYSTERECTOMY    . APPENDECTOMY    . BREAST SURGERY    . CHOLECYSTECTOMY  01/22/2018   LAPROSCOPIC   . CHOLECYSTECTOMY N/A 01/22/2018   Procedure: LAPAROSCOPIC CHOLECYSTECTOMY WITH INTRAOPERATIVE CHOLANGIOGRAM;  Surgeon: Donnie Mesa, MD;  Location: [facility];  Service: General;  Laterality: N/A;  . COLONOSCOPY  2013   due next 03-2017  . EYE SURGERY     bil cataracts  . IUD REMOVAL     with appendectomy  . MASTECTOMY, MODIFIED RADICAL W/RECONSTRUCTION Left 15 years ago   10 nodes out  . TUMOR EXCISION Left    x 2, neck, head    ROS  Objective:   Vitals: BP 134/71 (BP Location: Right Arm)   Pulse 78   Temp 97.8 F (36.6 C) (Oral)   Resp 16   SpO2 100%   Physical Exam Constitutional:      General: She  is not in acute distress.    Appearance: She is well-developed. She is not ill-appearing.  HENT:     Head: Normocephalic and atraumatic.     Right Ear: Tympanic membrane and ear canal normal. No drainage or tenderness. No middle ear effusion. Tympanic membrane is not erythematous.     Left Ear: Tympanic membrane and ear canal normal. No drainage or tenderness.  No middle ear  effusion. Tympanic membrane is not erythematous.     Nose: No congestion or rhinorrhea.     Mouth/Throat:     Mouth: Mucous membranes are moist. No oral lesions.     Pharynx: Oropharynx is clear. No pharyngeal swelling, oropharyngeal exudate, posterior oropharyngeal erythema or uvula swelling.     Tonsils: No tonsillar exudate or tonsillar abscesses.  Eyes:     General: No scleral icterus.       Right eye: No discharge.        Left eye: No discharge.     Extraocular Movements: Extraocular movements intact.     Right eye: Normal extraocular motion.     Left eye: Normal extraocular motion.     Conjunctiva/sclera: Conjunctivae normal.     Pupils: Pupils are equal, round, and reactive to light.  Neck:     Musculoskeletal: Normal range of motion and neck supple.  Cardiovascular:     Rate and Rhythm: Normal rate.  Pulmonary:     Effort: Pulmonary effort is normal.  Lymphadenopathy:     Cervical: No cervical adenopathy.  Skin:    General: Skin is warm and dry.  Neurological:     General: No focal deficit present.     Mental Status: She is alert and oriented to person, place, and time.     Cranial Nerves: No cranial nerve deficit.     Coordination: Coordination normal.     Deep Tendon Reflexes: Reflexes normal.  Psychiatric:        Mood and Affect: Mood normal.        Behavior: Behavior normal.        Thought Content: Thought content normal.        Judgment: Judgment normal.        Assessment and Plan :   1. Fall, initial encounter   2. Contusion of face, initial encounter   3. Abrasion of left  shoulder, initial encounter   4. Injury of head, initial encounter   5. Abrasion of left knee, initial encounter     Counseled patient that she needs a CT of the orbit/head to rule out orbital fracture.  She does not want to go to the emergency room and given her vital signs, physical exam findings I do not believe this is an emergency either.  Counseled that she needs to contact her PCP to make sure she obtains an outpatient CT through her PCP.  She was not inclined to take any pain medicines as her pain is very minimal but she was agreeable to having tramadol on hand should her pain become more severe.  She will maintain strict ER precautions.   Jaynee Eagles, PA-C 06/14/19 1241

## 2019-06-14 NOTE — Discharge Instructions (Addendum)
You will need a head CT, CT of the eye orbit to make sure you did not suffer a fracture from your fall. I do not believe that you need to go to the ER right now. But if your pain worsens, your vision changes, you need to go to the ER. In the meantime, you can use Advil 200mg  once every 8 hours as needed for pain and inflammation. I will also prescribe Tramadol 50mg  to use twice daily as needed for severe pain.

## 2019-06-14 NOTE — ED Triage Notes (Signed)
Pt present a bruise on her left eye from a fall yesterday. Pt was running,  When  Slipped and fall and hit the left side of her face.

## 2019-06-16 ENCOUNTER — Other Ambulatory Visit: Payer: Self-pay | Admitting: Family Medicine

## 2019-06-16 ENCOUNTER — Telehealth: Payer: Medicare Other | Admitting: Cardiology

## 2019-06-16 ENCOUNTER — Ambulatory Visit
Admission: RE | Admit: 2019-06-16 | Discharge: 2019-06-16 | Disposition: A | Discharge status: Home / Self Care | Payer: Medicare Other | Source: Ambulatory Visit | Attending: Family Medicine | Admitting: Family Medicine

## 2019-06-16 DIAGNOSIS — S0993XA Unspecified injury of face, initial encounter: Secondary | ICD-10-CM

## 2019-06-18 ENCOUNTER — Inpatient Hospital Stay: Payer: Medicare Other

## 2019-06-19 ENCOUNTER — Inpatient Hospital Stay: Payer: Medicare Other

## 2019-06-19 ENCOUNTER — Other Ambulatory Visit: Payer: Self-pay

## 2019-06-19 VITALS — BP 122/62 | HR 73 | Temp 99.1°F | Resp 16

## 2019-06-19 DIAGNOSIS — E1165 Type 2 diabetes mellitus with hyperglycemia: Secondary | ICD-10-CM | POA: Diagnosis not present

## 2019-06-19 DIAGNOSIS — D638 Anemia in other chronic diseases classified elsewhere: Secondary | ICD-10-CM

## 2019-06-19 LAB — IRON AND TIBC
Iron: 92 ug/dL (ref 41–142)
Saturation Ratios: 31 % (ref 21–57)
TIBC: 301 ug/dL (ref 236–444)
UIBC: 209 ug/dL (ref 120–384)

## 2019-06-19 LAB — CBC WITH DIFFERENTIAL (CANCER CENTER ONLY)
Abs Immature Granulocytes: 0.01 10*3/uL (ref 0.00–0.07)
Basophils Absolute: 0 10*3/uL (ref 0.0–0.1)
Basophils Relative: 0 %
Eosinophils Absolute: 0.2 10*3/uL (ref 0.0–0.5)
Eosinophils Relative: 3 %
HCT: 33.4 % — ABNORMAL LOW (ref 36.0–46.0)
Hemoglobin: 10.3 g/dL — ABNORMAL LOW (ref 12.0–15.0)
Immature Granulocytes: 0 %
Lymphocytes Relative: 27 %
Lymphs Abs: 1.6 10*3/uL (ref 0.7–4.0)
MCH: 27.5 pg (ref 26.0–34.0)
MCHC: 30.8 g/dL (ref 30.0–36.0)
MCV: 89.1 fL (ref 80.0–100.0)
Monocytes Absolute: 0.7 10*3/uL (ref 0.1–1.0)
Monocytes Relative: 12 %
Neutro Abs: 3.3 10*3/uL (ref 1.7–7.7)
Neutrophils Relative %: 58 %
Platelet Count: 382 10*3/uL (ref 150–400)
RBC: 3.75 MIL/uL — ABNORMAL LOW (ref 3.87–5.11)
RDW: 14.9 % (ref 11.5–15.5)
WBC Count: 5.7 10*3/uL (ref 4.0–10.5)
nRBC: 0 % (ref 0.0–0.2)

## 2019-06-19 LAB — FERRITIN: Ferritin: 126 ng/mL (ref 11–307)

## 2019-06-19 MED ORDER — EPOETIN ALFA-EPBX 10000 UNIT/ML IJ SOLN
20000.0000 [IU] | Freq: Once | INTRAMUSCULAR | Status: AC
  Administered 2019-06-19: 20000 [IU] via SUBCUTANEOUS
  Filled 2019-06-19: qty 2

## 2019-06-19 MED ORDER — EPOETIN ALFA 20000 UNIT/ML IJ SOLN
INTRAMUSCULAR | Status: AC
  Filled 2019-06-19: qty 1

## 2019-06-19 NOTE — Patient Instructions (Signed)

## 2019-06-24 ENCOUNTER — Other Ambulatory Visit: Payer: Self-pay

## 2019-06-24 ENCOUNTER — Telehealth (INDEPENDENT_AMBULATORY_CARE_PROVIDER_SITE_OTHER): Payer: Medicare Other | Admitting: Cardiology

## 2019-06-24 ENCOUNTER — Encounter: Payer: Self-pay | Admitting: Cardiology

## 2019-06-24 VITALS — Wt 151.0 lb

## 2019-06-24 DIAGNOSIS — E1139 Type 2 diabetes mellitus with other diabetic ophthalmic complication: Secondary | ICD-10-CM

## 2019-06-24 DIAGNOSIS — I420 Dilated cardiomyopathy: Secondary | ICD-10-CM | POA: Diagnosis not present

## 2019-06-24 DIAGNOSIS — I10 Essential (primary) hypertension: Secondary | ICD-10-CM

## 2019-06-24 DIAGNOSIS — E782 Mixed hyperlipidemia: Secondary | ICD-10-CM

## 2019-06-24 NOTE — Progress Notes (Signed)
Virtual Visit via Telephone Note   This visit type was conducted due to national recommendations for restrictions regarding the COVID-19 Pandemic (e.g. social distancing) in an effort to limit this patient's exposure and mitigate transmission in our community.  Due to her co-morbid illnesses, this patient is at least at moderate risk for complications without adequate follow up.  This format is felt to be most appropriate for this patient at this time.  The patient did not have access to video technology/had technical difficulties with video requiring transitioning to audio format only (telephone).  All issues noted in this document were discussed and addressed.  No physical exam could be performed with this format.  Please refer to the patient's chart for her  consent to telehealth for [facility].  Evaluation Performed:  Follow-up visit  This visit type was conducted due to national recommendations for restrictions regarding the COVID-19 Pandemic (e.g. social distancing).  This format is felt to be most appropriate for this patient at this time.  All issues noted in this document were discussed and addressed.  No physical exam was performed (except for noted visual exam findings with Video Visits).  Please refer to the patient's chart (MyChart message for video visits and phone note for telephone visits) for the patient's consent to telehealth for [facility].  Date:  06/24/2019  ID: Elizabeth Olsen, DOB 1941-10-22, MRN [id number]   Patient Location: [place] [address]   Provider location:   [place] Office  PCP:  [name], MD  Cardiologist:  [name], MD     Chief Complaint: I am doing well  History of Present Illness:    Elizabeth Olsen is a 78 y.o. female  who presents via audio/video conferencing for a telehealth visit today.  With history of cardiomyopathy likely with normalization of left ventricular ejection fraction based  on last echocardiogram, history of congestive heart failure now compensated, diabetes which is poorly controlled, essential hypertension dyslipidemia.  Recently she had a falling down she actually lost her balance and she got to shopping bags in her hands and went down with face first.  Multiple bruises and then going to the emergency room was discharged home and doing better right now.  Overall she said she is feeling better denies having any chest pain tightness squeezing pressure burning chest.  Still trying to be active and try to do as much as she can like always she does do some dietary indiscretions but overall she said she is trying to take better care of herself.   The patient does not have symptoms concerning for COVID-19 infection (fever, chills, cough, or new SHORTNESS OF BREATH).    Prior CV studies:   The following studies were reviewed today:       Past Medical History:  Diagnosis Date  . Anemia   . Blood transfusion   . Blood transfusion without reported diagnosis    with breast surgery  . Breast cancer ([place]) 15 years ago   left   . CHF (congestive heart failure) ([place])   . 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 Diabetes mellitus    dx 1998.  was told prior to getting chemo that her bld sugar rose.  She thought it would go back  . Dyspnea    d/t anemia  . GERD (gastroesophageal reflux disease)   . Heart murmur    as child  . Hernia, incisional   . Hyperlipidemia   .  Hypertension   . Hypothyroidism   . Vitamin D deficiency     Past Surgical History:  Procedure Laterality Date  . ABDOMINAL HYSTERECTOMY    . APPENDECTOMY    . BREAST SURGERY    . CHOLECYSTECTOMY  01/22/2018   LAPROSCOPIC   . CHOLECYSTECTOMY N/A 01/22/2018   Procedure: LAPAROSCOPIC CHOLECYSTECTOMY WITH INTRAOPERATIVE CHOLANGIOGRAM;  Surgeon: Donnie Mesa, MD;  Location: [facility];  Service: General;  Laterality: N/A;  . COLONOSCOPY  2013   due next 03-2017  . EYE SURGERY     bil cataracts  . IUD  REMOVAL     with appendectomy  . MASTECTOMY, MODIFIED RADICAL W/RECONSTRUCTION Left 15 years ago   10 nodes out  . TUMOR EXCISION Left    x 2, neck, head     Current Meds  Medication Sig  . atorvastatin (LIPITOR) 10 MG tablet TAKE 1 TABLET BY MOUTH EVERY DAY ONE TIME ONLY (Patient taking differently: Take 10 mg by mouth daily. )  . calcium carbonate (OSCAL) 1500 (600 Ca) MG TABS tablet Take 600 mg by mouth daily with breakfast.   . carvedilol (COREG) 6.25 MG tablet TAKE 1 TABLET BY MOUTH TWICE DAILY  . carvedilol (COREG) 6.25 MG tablet TAKE 1 TABLET BY MOUTH TWICE DAILY  . fenofibrate 160 MG tablet Take 160 mg by mouth daily.  . fish oil-omega-3 fatty acids 1000 MG capsule Take 1 g by mouth daily.   . furosemide (LASIX) 20 MG tablet TAKE 3 TABLETS EVERY DAY  . Insulin Glargine (LANTUS SOLOSTAR) 100 UNIT/ML Solostar Pen Inject 8 Units into the skin 2 (two) times daily.   . insulin lispro (HUMALOG KWIKPEN) 100 UNIT/ML KiwkPen Inject 5-20 Units into the skin 3 (three) times daily. Sliding Scale  . iron polysaccharides (NIFEREX) 150 MG capsule Take 150 mg by mouth 2 (two) times daily.   Marland Kitchen levothyroxine (SYNTHROID, LEVOTHROID) 112 MCG tablet Take 112 mcg by mouth See admin instructions. Take 1 tablet (112 mcg) by mouth on 6 days of the week; hold on Sundays.  Marland Kitchen losartan (COZAAR) 25 MG tablet TAKE 1 TABLET BY MOUTH DAILY  . Menthol-Methyl Salicylate (MUSCLE RUB) 10-15 % CREA Apply 1 application topically 4 (four) times daily as needed for muscle pain.   . mupirocin ointment (BACTROBAN) 2 % Apply 1 application topically 3 (three) times daily.  Glory Rosebush VERIO test strip USE TO TEST UP TO 10 TIMES D  . vitamin C (ASCORBIC ACID) 500 MG tablet Take 500 mg by mouth daily.  . Vitamin D, Ergocalciferol, (DRISDOL) 50000 UNITS CAPS Take 50,000 Units by mouth 2 (two) times a week. Sundays & Wednesdays.      Family History: The patient's family history includes Congestive Heart Failure in her father;  Diabetes in an other family member; Heart attack in her brother; Hypertension in her father, maternal grandfather, and maternal grandmother; Peripheral vascular disease in her father; Stomach cancer in her maternal grandmother. There is no history of Colon cancer, Esophageal cancer, Pancreatic cancer, or Rectal cancer.   ROS:   Please see the history of present illness.     All other systems reviewed and are negative.   Labs/Other Tests and Data Reviewed:     Recent Labs: 03/03/2019: ALT 23; BUN 30; Creatinine 1.31; Potassium 5.5; Sodium 139 06/19/2019: Hemoglobin 10.3; Platelet Count 382  Recent Lipid Panel    Component Value Date/Time   CHOL 184 07/23/2017 1538   TRIG 91 07/23/2017 1538   HDL 77 07/23/2017 1538  CHOLHDL 2.4 07/23/2017 1538   LDLCALC 89 07/23/2017 1538      Exam:    Vital Signs:  Wt 151 lb (68.5 kg)   BMI 27.18 kg/m     Wt Readings from Last 3 Encounters:  06/24/19 151 lb (68.5 kg)  05/21/19 152 lb (68.9 kg)  03/11/19 154 lb 6.4 oz (70 kg)     Well nourished, well developed in no acute distress. Alert awake and x3 talking to me over the phone unable to establish video link.  Not in any distress  Diagnosis for this visit:   1. Dilated cardiomyopathy ([place])   2. Essential hypertension   3. Type 2 diabetes mellitus with other ophthalmic complication, with long-term current use of insulin ([place])   4. Mixed hyperlipidemia      ASSESSMENT & PLAN:    1.  Dilated cardiomyopathy in the wintertime we will repeat her echocardiogram physically and clinically she is doing well I will continue present management. Essential hypertension blood pressure well controlled continue present management. 3.  Type 2 diabetes always a problem because of her dietary indiscretions.  She said she is doing better but she reports the sugar going up and down. 4.  Mixed dyslipidemia she is on statin which I will continue.  Overall she seems to be doing well from cardiac  standpoint review we will continue present management we will see her back in 3 months  COVID-19 Education: The signs and symptoms of COVID-19 were discussed with the patient and how to seek care for testing (follow up with PCP or arrange E-visit).  The importance of social distancing was discussed today.  Patient Risk:   After full review of this patients clinical status, I feel that they are at least moderate risk at this time.  Time:   Today, I have spent 15 minutes with the patient with telehealth technology discussing pt health issues.  I spent 5 minutes reviewing her chart before the visit.  Visit was finished at 12:05 PM.    Medication Adjustments/Labs and Tests Ordered: Current medicines are reviewed at length with the patient today.  Concerns regarding medicines are outlined above.  No orders of the defined types were placed in this encounter.  Medication changes: No orders of the defined types were placed in this encounter.    Disposition: 3 months  Signed, Park Liter, MD, [facility] 06/24/2019 12:20 PM    [place]

## 2019-06-24 NOTE — Patient Instructions (Signed)
Medication Instructions:  Your physician recommends that you continue on your current medications as directed. Please refer to the Current Medication list given to you today.  If you need a refill on your cardiac medications before your next appointment, please call your pharmacy.   Lab work: None.  If you have labs (blood work) drawn today and your tests are completely normal, you will receive your results only by: . MyChart Message (if you have MyChart) OR . A paper copy in the mail If you have any lab test that is abnormal or we need to change your treatment, we will call you to review the results.  Testing/Procedures: None.   Follow-Up: At [facility], you and your health needs are our priority.  As part of our continuing mission to provide you with exceptional heart care, we have created designated Provider Care Teams.  These Care Teams include your primary Cardiologist (physician) and Advanced Practice Providers (APPs -  Physician Assistants and Nurse Practitioners) who all work together to provide you with the care you need, when you need it. You will need a follow up appointment in 3 months.  Please call our office 2 months in advance to schedule this appointment.  You may see Robert Krasowski, MD or another member of our [facility] Provider Team in [place]: Brian Munley, MD . Rajan Revankar, MD  Any Other Special Instructions Will Be Listed Below (If Applicable).    

## 2019-06-30 ENCOUNTER — Other Ambulatory Visit: Payer: Self-pay | Admitting: Hematology

## 2019-06-30 ENCOUNTER — Telehealth: Payer: Self-pay | Admitting: Hematology

## 2019-06-30 ENCOUNTER — Telehealth: Payer: Self-pay

## 2019-06-30 DIAGNOSIS — E039 Hypothyroidism, unspecified: Secondary | ICD-10-CM

## 2019-06-30 DIAGNOSIS — E559 Vitamin D deficiency, unspecified: Secondary | ICD-10-CM

## 2019-06-30 NOTE — Telephone Encounter (Signed)
Patient calls stating she would like to speak to Dr. Burr Medico, she is so fatigued that she cannot do anything around the house..She has a lab and injection appointment on 9/2.   [phone]

## 2019-06-30 NOTE — Telephone Encounter (Signed)
Added f/u to 9/2 lab/injection per 8/31 schedule message. Confirmed with patient.

## 2019-06-30 NOTE — Telephone Encounter (Signed)
I called her back, discussed with her and will see her when she comes in on 9/2.   Truitt Merle MD

## 2019-06-30 NOTE — Progress Notes (Signed)
[place]   [phone]) [phone] [phone]   Clinic Follow up Note   Patient Care Team: [name], MD as PCP - General (Family Medicine) Park Liter, MD as PCP - Cardiology (Cardiology) Reynold Bowen, MD as Consulting Physician (Endocrinology) Bobbye Charleston, MD as Consulting Physician (Obstetrics and Gynecology)  Date of Service:  07/02/2019  CHIEF COMPLAINT: F/u anemia of chronic disease    CURRENT THERAPY:  Aranesp injection 187mgmonthly started on 01/01/18. Due to insurance was changed to Retacrit every 2 weeks starting 03/11/19.   INTERVAL HISTORY:  Elizabeth DOOLANis here for a follow up of anemia. She presents to the clinic alone. She called her daughter to be included in the visit today. She notes she feels very tired lately. She will have to stop often during activities. She denies any pain. She notes she fell 3 weeks ago on her face on cement. She has bruised left eye, shoulder and knee. She notes she takes care of her husband who has stage IV lung cancer. He has significant neuropathy from chemo treatment. She stays with him consistently overall.  She denies any trouble sleeping, profuse sweating. She takes Vit D 50,000 units a  Week. She notes she purposefully trying to lose weight. She notes her latest Mammogram was normal.    REVIEW OF SYSTEMS:   Constitutional: Denies fevers, chills (+) Purposeful weight loss (+) Very fatigued  Eyes: Denies blurriness of vision Ears, nose, mouth, throat, and face: Denies mucositis or sore throat Respiratory: Denies cough, dyspnea or wheezes Cardiovascular: Denies palpitation, chest discomfort or lower extremity swelling Gastrointestinal:  Denies nausea, heartburn or change in bowel habits Skin: Denies abnormal skin rashes (+) Bruise of left eye, shoulder, knee  Lymphatics: Denies new lymphadenopathy or easy bruising Neurological:Denies numbness, tingling or new weaknesses Behavioral/Psych:  Mood is stable, no new changes  All other systems were reviewed with the patient and are negative.  MEDICAL HISTORY:  Past Medical History:  Diagnosis Date  . Anemia   . Blood transfusion   . Blood transfusion without reported diagnosis    with breast surgery  [place]   [place]   . [place]   . 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 Diabetes mellitus    dx 1998.  was told prior to getting chemo that her bld sugar rose.  She thought it would go back  . Dyspnea    d/t anemia  . GERD (gastroesophageal reflux disease)   . Heart murmur    as child  . Hernia, incisional   . Hyperlipidemia   . Hypertension   . Hypothyroidism   . Vitamin D deficiency     SURGICAL HISTORY: Past Surgical History:  Procedure Laterality Date  . ABDOMINAL HYSTERECTOMY    . APPENDECTOMY    . BREAST SURGERY    . CHOLECYSTECTOMY  01/22/2018   LAPROSCOPIC   . CHOLECYSTECTOMY N/A 01/22/2018   Procedure: LAPAROSCOPIC CHOLECYSTECTOMY WITH INTRAOPERATIVE CHOLANGIOGRAM;  Surgeon: TDonnie Mesa MD;  [facility]  Service: General;  Laterality: N/A;  . COLONOSCOPY  2013   due next 03-2017  . EYE SURGERY     bil cataracts  . IUD REMOVAL     with appendectomy  . MASTECTOMY, MODIFIED RADICAL W/RECONSTRUCTION Left 15 years ago   10 nodes out  . TUMOR EXCISION Left    x 2, neck, head    I have reviewed the social history and family  history with the patient and they are unchanged from previous note.  ALLERGIES:  is allergic to erythromycin; penicillins; nyquil [pseudoeph-doxylamine-dm-apap]; and sulfa antibiotics.  MEDICATIONS:  Current Outpatient Medications  Medication Sig Dispense Refill  . atorvastatin (LIPITOR) 10 MG tablet TAKE 1 TABLET BY MOUTH EVERY DAY ONE TIME ONLY (Patient taking differently: Take 10 mg by mouth daily. ) 90 tablet 2  . calcium carbonate (OSCAL) 1500 (600 Ca) MG TABS tablet Take 600 mg by mouth daily with breakfast.     .  carvedilol (COREG) 6.25 MG tablet TAKE 1 TABLET BY MOUTH TWICE DAILY 180 tablet 1  . carvedilol (COREG) 6.25 MG tablet TAKE 1 TABLET BY MOUTH TWICE DAILY 180 tablet 1  . fenofibrate 160 MG tablet Take 160 mg by mouth daily.    . fish oil-omega-3 fatty acids 1000 MG capsule Take 1 g by mouth daily.     . furosemide (LASIX) 20 MG tablet TAKE 3 TABLETS EVERY DAY 270 tablet 0  . Insulin Glargine (LANTUS SOLOSTAR) 100 UNIT/ML Solostar Pen Inject 8 Units into the skin 2 (two) times daily.     . insulin lispro (HUMALOG KWIKPEN) 100 UNIT/ML KiwkPen Inject 5-20 Units into the skin 3 (three) times daily. Sliding Scale    . iron polysaccharides (NIFEREX) 150 MG capsule Take 150 mg by mouth 2 (two) times daily.     Marland Kitchen levothyroxine (SYNTHROID, LEVOTHROID) 112 MCG tablet Take 112 mcg by mouth See admin instructions. Take 1 tablet (112 mcg) by mouth on 6 days of the week; hold on Sundays.    Marland Kitchen losartan (COZAAR) 25 MG tablet TAKE 1 TABLET BY MOUTH DAILY 90 tablet 1  . Menthol-Methyl Salicylate (MUSCLE RUB) 10-15 % CREA Apply 1 application topically 4 (four) times daily as needed for muscle pain.     . mupirocin ointment (BACTROBAN) 2 % Apply 1 application topically 3 (three) times daily. 15 g 0  . ONETOUCH VERIO test strip USE TO TEST UP TO 10 TIMES D  12  . vitamin C (ASCORBIC ACID) 500 MG tablet Take 500 mg by mouth daily.    . Vitamin D, Ergocalciferol, (DRISDOL) 50000 UNITS CAPS Take 50,000 Units by mouth 2 (two) times a week. Sundays & Wednesdays.     No current facility-administered medications for this visit.     PHYSICAL EXAMINATION: ECOG PERFORMANCE STATUS: 2 - Symptomatic, <50% confined to bed  Vitals:   07/02/19 1113  BP: 129/66  Pulse: 74  Resp: 18  Temp: 98.5 F (36.9 C)  SpO2: 100%   Filed Weights   07/02/19 1113  Weight: 149 lb 1.6 oz (67.6 kg)    GENERAL:alert, no distress and comfortable SKIN: skin color, texture, turgor are normal, no rashes or significant lesions EYES:  normal, Conjunctiva are pink and non-injected, sclera clear  NECK: supple, thyroid normal size, non-tender, without nodularity LYMPH:  no palpable lymphadenopathy in the cervical, axillary  LUNGS: clear to auscultation and percussion with normal breathing effort HEART: regular rate & rhythm and no murmurs and no lower extremity edema ABDOMEN:abdomen soft, non-tender and normal bowel sounds Musculoskeletal:no cyanosis of digits and no clubbing  NEURO: alert & oriented x 3 with fluent speech, no focal motor/sensory deficits  LABORATORY DATA:  I have reviewed the data as listed CBC Latest Ref Rng & Units 07/01/2019 06/19/2019 06/04/2019  WBC 4.0 - 10.5 K/uL 6.1 5.7 6.2  Hemoglobin 12.0 - 15.0 g/dL 10.8(L) 10.3(L) 10.3(L)  Hematocrit 36.0 - 46.0 % 35.3(L) 33.4(L) 33.3(L)  Platelets 150 -  400 K/uL 396 382 409(H)     CMP Latest Ref Rng & Units 03/03/2019 09/04/2018 09/04/2018  Glucose 70 - 99 mg/dL 105(H) 478(H) 694(HH)  BUN 8 - 23 mg/dL 30(H) 42(H) 38(H)  Creatinine 0.44 - 1.00 mg/dL 1.31(H) 1.23(H) 1.60(H)  Sodium 135 - 145 mmol/L 139 133(L) 133(L)  Potassium 3.5 - 5.1 mmol/L 5.5(H) 4.6 5.1  Chloride 98 - 111 mmol/L 99 93(L) 97(L)  CO2 22 - 32 mmol/L 33(H) 30 27  Calcium 8.9 - 10.3 mg/dL 9.1 9.6 9.4  Total Protein 6.5 - 8.1 g/dL 6.4(L) - 6.9  Total Bilirubin 0.3 - 1.2 mg/dL <0.2(L) - 0.2(L)  Alkaline Phos 38 - 126 U/L 72 - 56  AST 15 - 41 U/L 25 - 21  ALT 0 - 44 U/L 23 - 14      RADIOGRAPHIC STUDIES: I have personally reviewed the radiological images as listed and agreed with the findings in the report. No results found.   ASSESSMENT & PLAN:  Elizabeth Olsen is a 78 y.o. female with   1. Anemia of chronic diseases  -I previously reviewed her previous CBC and anemia workup, she had normal ferritin, iron study, X32 and folic acid in 4401, ret was low, normal MCV. No lab evidence for hemolysis or multiple myeloma. Her erythropoietin level was normal. -She does have chronic disease  including uncontrolled diabetes, congestive heart failure, and mild CKD, which can cause anemia of chronic disease -her bone marrow biopsy in 11/21/17 showed no evidence of MDS or other primary marrow disease. I think this is consistent with anemia of chronic disease.  -Since her Hgb is below 9 most of time, she has started monthly Aranesp injection in 12/2017, she is tolerating well. Due to insurance her Aranesp was changed to IV Retacrit every 2 weeks on 03/11/19 with goal Hg 10-11. We discussed the risk of thrombosis from above, especially when hemoglobin is above 11. She is tolerating well, and will continue  -Last dose Retacrit on 06/19/19 -She is clinically doing well except for her significant fatigue. She has also had purposeful weight loss. labs reviewed from yesterday, CBC WNL except Hg 10.8, HCT 35.3. Iron panel and Vit D normal, TSH 0.264.  -Will continue current regimen of lab and injection q2weeks. Will proceed with Retacrit today.  -F/u in 2 months   2. CHFand uncontrolled hypertension -Pt sees a cardiologist, Dr. Agustin Cree regularly. She takes 40 mg Lasixand potassium.  -Her blood pressure is very high,I strongly encouraged her to follow-up with her cardiologist -BP at 129/66 today (07/02/19)  3. DMwith hyperglycemia -She had severe hyperglycemia on 09/05/19 with BG 694.  -She will continue to f/u with Dr. Forde Dandy for management   4. Stage III CKD -Likely secondary to her diabetes.  -I previously advised her to drink plenty of fluids and to control her diabetes.   5. Hypothyroidsim, Significant Fatigue   -She is on Levothyroxine. She is also on Vit D 50,000 units twice a week. This is managed by Dr. Forde Dandy -She had a fall 3 weeks ago and resulted in multiple contusion. She is recovering well with no pain.  -Her 07/01/19 TSH was low at 0.264, lower than normal. I suspect her significant fatigue is related to his thyroid function. I recommend she see Dr. Forde Dandy for dose adjustment.   -Will repeat TSH in 4 weeks  -I also discussed fatigue could be related to her stress of taking care of her husband with stage IV lung cancer.   6.  Cancer screenings  -2020 Mammogram and DEXA was normal -Her last colonoscopy in 07/2017 with Dr. Cristobal Goldmann 1 benign polyp removed, otherwise normal.    PLAN: -Labs reviewed, proceed with Retacrit today  -Continue Lab and Retacrit injection every 2 weeks  -Repeat TSH in 4 weeks, will send her lab results to Dr. Forde Dandy  -F/u in 2 months    No problem-specific Assessment & Plan notes found for this encounter.   Orders Placed This Encounter  Procedures  . TSH    Standing Status:   Future    Standing Expiration Date:   07/01/2020   All questions were answered. The patient knows to call the clinic with any problems, questions or concerns. No barriers to learning was detected. I spent 15 minutes counseling the patient face to face. The total time spent in the appointment was 20 minutes and more than 50% was on counseling and review of test results     Truitt Merle, MD 07/02/2019   I, Joslyn Devon, am acting as scribe for Truitt Merle, MD.   I have reviewed the above documentation for accuracy and completeness, and I agree with the above.

## 2019-07-01 ENCOUNTER — Telehealth: Payer: Self-pay | Admitting: Hematology

## 2019-07-01 ENCOUNTER — Other Ambulatory Visit: Payer: Self-pay

## 2019-07-01 ENCOUNTER — Inpatient Hospital Stay: Payer: Medicare Other | Attending: Hematology

## 2019-07-01 DIAGNOSIS — D638 Anemia in other chronic diseases classified elsewhere: Secondary | ICD-10-CM | POA: Insufficient documentation

## 2019-07-01 DIAGNOSIS — E559 Vitamin D deficiency, unspecified: Secondary | ICD-10-CM

## 2019-07-01 DIAGNOSIS — E1165 Type 2 diabetes mellitus with hyperglycemia: Secondary | ICD-10-CM | POA: Diagnosis not present

## 2019-07-01 DIAGNOSIS — E039 Hypothyroidism, unspecified: Secondary | ICD-10-CM

## 2019-07-01 LAB — CBC WITH DIFFERENTIAL (CANCER CENTER ONLY)
Abs Immature Granulocytes: 0.02 10*3/uL (ref 0.00–0.07)
Basophils Absolute: 0.1 10*3/uL (ref 0.0–0.1)
Basophils Relative: 1 %
Eosinophils Absolute: 0.1 10*3/uL (ref 0.0–0.5)
Eosinophils Relative: 2 %
HCT: 35.3 % — ABNORMAL LOW (ref 36.0–46.0)
Hemoglobin: 10.8 g/dL — ABNORMAL LOW (ref 12.0–15.0)
Immature Granulocytes: 0 %
Lymphocytes Relative: 18 %
Lymphs Abs: 1.1 10*3/uL (ref 0.7–4.0)
MCH: 27 pg (ref 26.0–34.0)
MCHC: 30.6 g/dL (ref 30.0–36.0)
MCV: 88.3 fL (ref 80.0–100.0)
Monocytes Absolute: 0.5 10*3/uL (ref 0.1–1.0)
Monocytes Relative: 8 %
Neutro Abs: 4.4 10*3/uL (ref 1.7–7.7)
Neutrophils Relative %: 71 %
Platelet Count: 396 10*3/uL (ref 150–400)
RBC: 4 MIL/uL (ref 3.87–5.11)
RDW: 15 % (ref 11.5–15.5)
WBC Count: 6.1 10*3/uL (ref 4.0–10.5)
nRBC: 0 % (ref 0.0–0.2)

## 2019-07-01 LAB — IRON AND TIBC
Iron: 108 ug/dL (ref 41–142)
Saturation Ratios: 40 % (ref 21–57)
TIBC: 274 ug/dL (ref 236–444)
UIBC: 166 ug/dL (ref 120–384)

## 2019-07-01 LAB — FERRITIN: Ferritin: 150 ng/mL (ref 11–307)

## 2019-07-01 LAB — TSH: TSH: 0.264 u[IU]/mL — ABNORMAL LOW (ref 0.308–3.960)

## 2019-07-01 NOTE — Telephone Encounter (Signed)
Patient requested 09/02 lab appointment to be rescheduled for 09/01.

## 2019-07-01 NOTE — Telephone Encounter (Signed)
Opened by accident

## 2019-07-02 ENCOUNTER — Other Ambulatory Visit: Payer: Self-pay

## 2019-07-02 ENCOUNTER — Inpatient Hospital Stay: Payer: Medicare Other

## 2019-07-02 ENCOUNTER — Encounter: Payer: Self-pay | Admitting: Hematology

## 2019-07-02 ENCOUNTER — Inpatient Hospital Stay (HOSPITAL_BASED_OUTPATIENT_CLINIC_OR_DEPARTMENT_OTHER): Payer: Medicare Other | Admitting: Hematology

## 2019-07-02 ENCOUNTER — Telehealth: Payer: Self-pay

## 2019-07-02 VITALS — BP 128/64 | HR 72 | Temp 98.1°F | Resp 16

## 2019-07-02 VITALS — BP 129/66 | HR 74 | Temp 98.5°F | Resp 18 | Ht 62.5 in | Wt 149.1 lb

## 2019-07-02 DIAGNOSIS — D638 Anemia in other chronic diseases classified elsewhere: Secondary | ICD-10-CM | POA: Diagnosis not present

## 2019-07-02 DIAGNOSIS — E1165 Type 2 diabetes mellitus with hyperglycemia: Secondary | ICD-10-CM | POA: Diagnosis not present

## 2019-07-02 LAB — VITAMIN D 25 HYDROXY (VIT D DEFICIENCY, FRACTURES): Vit D, 25-Hydroxy: 34.6 ng/mL (ref 30.0–100.0)

## 2019-07-02 MED ORDER — EPOETIN ALFA-EPBX 10000 UNIT/ML IJ SOLN
20000.0000 [IU] | Freq: Once | INTRAMUSCULAR | Status: AC
  Administered 2019-07-02: 20000 [IU] via SUBCUTANEOUS
  Filled 2019-07-02: qty 2

## 2019-07-02 NOTE — Patient Instructions (Signed)
Epoetin Alfa injection °What is this medicine? °EPOETIN ALFA (e POE e tin AL fa) helps your body make more red blood cells. This medicine is used to treat anemia caused by chronic kidney disease, cancer chemotherapy, or HIV-therapy. It may also be used before surgery if you have anemia. °This medicine may be used for other purposes; ask your health care provider or pharmacist if you have questions. °COMMON BRAND NAME(S): Epogen, Procrit, Retacrit °What should I tell my health care provider before I take this medicine? °They need to know if you have any of these conditions: °-cancer °-heart disease °-high blood pressure °-history of blood clots °-history of stroke °-low levels of folate, iron, or vitamin B12 in the blood °-seizures °-an unusual or allergic reaction to erythropoietin, albumin, benzyl alcohol, hamster proteins, other medicines, foods, dyes, or preservatives °-pregnant or trying to get pregnant °-breast-feeding °How should I use this medicine? °This medicine is for injection into a vein or under the skin. It is usually given by a health care professional in a hospital or clinic setting. °If you get this medicine at home, you will be taught how to prepare and give this medicine. Use exactly as directed. Take your medicine at regular intervals. Do not take your medicine more often than directed. °It is important that you put your used needles and syringes in a special sharps container. Do not put them in a trash can. If you do not have a sharps container, call your pharmacist or healthcare provider to get one. °A special MedGuide will be given to you by the pharmacist with each prescription and refill. Be sure to read this information carefully each time. °Talk to your pediatrician regarding the use of this medicine in children. While this drug may be prescribed for selected conditions, precautions do apply. °Overdosage: If you think you have taken too much of this medicine contact a poison control center  or emergency room at once. °NOTE: This medicine is only for you. Do not share this medicine with others. °What if I miss a dose? °If you miss a dose, take it as soon as you can. If it is almost time for your next dose, take only that dose. Do not take double or extra doses. °What may interact with this medicine? °Interactions have not been studied. °This list may not describe all possible interactions. Give your health care provider a list of all the medicines, herbs, non-prescription drugs, or dietary supplements you use. Also tell them if you smoke, drink alcohol, or use illegal drugs. Some items may interact with your medicine. °What should I watch for while using this medicine? °Your condition will be monitored carefully while you are receiving this medicine. °You may need blood work done while you are taking this medicine. °This medicine may cause a decrease in vitamin B6. You should make sure that you get enough vitamin B6 while you are taking this medicine. Discuss the foods you eat and the vitamins you take with your health care professional. °What side effects may I notice from receiving this medicine? °Side effects that you should report to your doctor or health care professional as soon as possible: °-allergic reactions like skin rash, itching or hives, swelling of the face, lips, or tongue °-seizures °-signs and symptoms of a blood clot such as breathing problems; changes in vision; chest pain; severe, sudden headache; pain, swelling, warmth in the leg; trouble speaking; sudden numbness or weakness of the face, arm or leg °-signs and symptoms of a stroke   like changes in vision; confusion; trouble speaking or understanding; severe headaches; sudden numbness or weakness of the face, arm or leg; trouble walking; dizziness; loss of balance or coordination °Side effects that usually do not require medical attention (report to your doctor or health care professional if they continue or are  bothersome): °-chills °-cough °-dizziness °-fever °-headaches °-joint pain °-muscle cramps °-muscle pain °-nausea, vomiting °-pain, redness, or irritation at site where injected °This list may not describe all possible side effects. Call your doctor for medical advice about side effects. You may report side effects to FDA at 1-800-FDA-1088. °Where should I keep my medicine? °Keep out of the reach of children. °Store in a refrigerator between 2 and 8 degrees C (36 and 46 degrees F). Do not freeze or shake. Throw away any unused portion if using a single-dose vial. Multi-dose vials can be kept in the refrigerator for up to 21 days after the initial dose. Throw away unused medicine. °NOTE: This sheet is a summary. It may not cover all possible information. If you have questions about this medicine, talk to your doctor, pharmacist, or health care provider. °© 2019 Elsevier/Gold Standard (2017-05-25 08:35:19) ° °

## 2019-07-02 NOTE — Telephone Encounter (Signed)
Faxed lab results from 07/01/2019 to Dr. Reynold Bowen indicating TSH 0.264.  Faxe [phone]

## 2019-07-03 ENCOUNTER — Telehealth: Payer: Self-pay | Admitting: Hematology

## 2019-07-03 NOTE — Telephone Encounter (Signed)
No los per 9/2. °

## 2019-07-04 ENCOUNTER — Telehealth: Payer: Self-pay | Admitting: *Deleted

## 2019-07-04 NOTE — Telephone Encounter (Signed)
Per Dr.Feng, called pt regarding normal VitD levels. Message was left on vmail. Advised if there were any questions or concerns to call facility.

## 2019-07-04 NOTE — Telephone Encounter (Signed)
-----   Message from Truitt Merle, MD sent at 07/04/2019  7:29 AM EDT ----- Please let pt know her recent VitD level was normal, thanks  Truitt Merle  07/04/2019

## 2019-07-16 ENCOUNTER — Other Ambulatory Visit: Payer: Self-pay

## 2019-07-16 ENCOUNTER — Inpatient Hospital Stay: Payer: Medicare Other

## 2019-07-16 VITALS — BP 150/64 | HR 73 | Temp 97.8°F | Resp 16

## 2019-07-16 DIAGNOSIS — D638 Anemia in other chronic diseases classified elsewhere: Secondary | ICD-10-CM

## 2019-07-16 DIAGNOSIS — E1165 Type 2 diabetes mellitus with hyperglycemia: Secondary | ICD-10-CM | POA: Diagnosis not present

## 2019-07-16 LAB — CBC WITH DIFFERENTIAL (CANCER CENTER ONLY)
Abs Immature Granulocytes: 0.01 10*3/uL (ref 0.00–0.07)
Basophils Absolute: 0 10*3/uL (ref 0.0–0.1)
Basophils Relative: 1 %
Eosinophils Absolute: 0.2 10*3/uL (ref 0.0–0.5)
Eosinophils Relative: 3 %
HCT: 33.5 % — ABNORMAL LOW (ref 36.0–46.0)
Hemoglobin: 10.5 g/dL — ABNORMAL LOW (ref 12.0–15.0)
Immature Granulocytes: 0 %
Lymphocytes Relative: 26 %
Lymphs Abs: 1.6 10*3/uL (ref 0.7–4.0)
MCH: 27.7 pg (ref 26.0–34.0)
MCHC: 31.3 g/dL (ref 30.0–36.0)
MCV: 88.4 fL (ref 80.0–100.0)
Monocytes Absolute: 0.7 10*3/uL (ref 0.1–1.0)
Monocytes Relative: 11 %
Neutro Abs: 3.5 10*3/uL (ref 1.7–7.7)
Neutrophils Relative %: 59 %
Platelet Count: 357 10*3/uL (ref 150–400)
RBC: 3.79 MIL/uL — ABNORMAL LOW (ref 3.87–5.11)
RDW: 15.2 % (ref 11.5–15.5)
WBC Count: 6 10*3/uL (ref 4.0–10.5)
nRBC: 0 % (ref 0.0–0.2)

## 2019-07-16 LAB — IRON AND TIBC
Iron: 68 ug/dL (ref 41–142)
Saturation Ratios: 26 % (ref 21–57)
TIBC: 263 ug/dL (ref 236–444)
UIBC: 195 ug/dL (ref 120–384)

## 2019-07-16 LAB — FERRITIN: Ferritin: 133 ng/mL (ref 11–307)

## 2019-07-16 MED ORDER — EPOETIN ALFA-EPBX 10000 UNIT/ML IJ SOLN
20000.0000 [IU] | Freq: Once | INTRAMUSCULAR | Status: AC
  Administered 2019-07-16: 11:00:00 20000 [IU] via SUBCUTANEOUS
  Filled 2019-07-16: qty 2

## 2019-07-16 NOTE — Progress Notes (Signed)
PT stated she has been under a lot of stress I advised her to keep a check on BP and to call the office if BP isn't regulated.

## 2019-07-16 NOTE — Patient Instructions (Signed)
Darbepoetin Alfa injection What is this medicine? DARBEPOETIN ALFA (dar be POE e tin AL fa) helps your body make more red blood cells. It is used to treat anemia caused by chronic kidney failure and chemotherapy. This medicine may be used for other purposes; ask your health care provider or pharmacist if you have questions. COMMON BRAND NAME(S): Aranesp What should I tell my health care provider before I take this medicine? They need to know if you have any of these conditions:  blood clotting disorders or history of blood clots  cancer patient not on chemotherapy  cystic fibrosis  heart disease, such as angina, heart failure, or a history of a heart attack  hemoglobin level of 12 g/dL or greater  high blood pressure  low levels of folate, iron, or vitamin B12  seizures  an unusual or allergic reaction to darbepoetin, erythropoietin, albumin, hamster proteins, latex, other medicines, foods, dyes, or preservatives  pregnant or trying to get pregnant  breast-feeding How should I use this medicine? This medicine is for injection into a vein or under the skin. It is usually given by a health care professional in a hospital or clinic setting. If you get this medicine at home, you will be taught how to prepare and give this medicine. Use exactly as directed. Take your medicine at regular intervals. Do not take your medicine more often than directed. It is important that you put your used needles and syringes in a special sharps container. Do not put them in a trash can. If you do not have a sharps container, call your pharmacist or healthcare provider to get one. A special MedGuide will be given to you by the pharmacist with each prescription and refill. Be sure to read this information carefully each time. Talk to your pediatrician regarding the use of this medicine in children. While this medicine may be used in children as young as 1 month of age for selected conditions, precautions do  apply. Overdosage: If you think you have taken too much of this medicine contact a poison control center or emergency room at once. NOTE: This medicine is only for you. Do not share this medicine with others. What if I miss a dose? If you miss a dose, take it as soon as you can. If it is almost time for your next dose, take only that dose. Do not take double or extra doses. What may interact with this medicine? Do not take this medicine with any of the following medications:  epoetin alfa This list may not describe all possible interactions. Give your health care provider a list of all the medicines, herbs, non-prescription drugs, or dietary supplements you use. Also tell them if you smoke, drink alcohol, or use illegal drugs. Some items may interact with your medicine. What should I watch for while using this medicine? Your condition will be monitored carefully while you are receiving this medicine. You may need blood work done while you are taking this medicine. This medicine may cause a decrease in vitamin B6. You should make sure that you get enough vitamin B6 while you are taking this medicine. Discuss the foods you eat and the vitamins you take with your health care professional. What side effects may I notice from receiving this medicine? Side effects that you should report to your doctor or health care professional as soon as possible:  allergic reactions like skin rash, itching or hives, swelling of the face, lips, or tongue  breathing problems  changes in   vision  chest pain  confusion, trouble speaking or understanding  feeling faint or lightheaded, falls  high blood pressure  muscle aches or pains  pain, swelling, warmth in the leg  rapid weight gain  severe headaches  sudden numbness or weakness of the face, arm or leg  trouble walking, dizziness, loss of balance or coordination  seizures (convulsions)  swelling of the ankles, feet, hands  unusually weak or  tired Side effects that usually do not require medical attention (report to your doctor or health care professional if they continue or are bothersome):  diarrhea  fever, chills (flu-like symptoms)  headaches  nausea, vomiting  redness, stinging, or swelling at site where injected This list may not describe all possible side effects. Call your doctor for medical advice about side effects. You may report side effects to FDA at 1-800-FDA-1088. Where should I keep my medicine? Keep out of the reach of children. Store in a refrigerator between 2 and 8 degrees C (36 and 46 degrees F). Do not freeze. Do not shake. Throw away any unused portion if using a single-dose vial. Throw away any unused medicine after the expiration date. NOTE: This sheet is a summary. It may not cover all possible information. If you have questions about this medicine, talk to your doctor, pharmacist, or health care provider.  2020 Elsevier/Gold Standard (2017-10-31 16:44:20)  

## 2019-07-19 ENCOUNTER — Other Ambulatory Visit: Payer: Self-pay | Admitting: Hematology

## 2019-07-19 DIAGNOSIS — D464 Refractory anemia, unspecified: Secondary | ICD-10-CM

## 2019-07-30 ENCOUNTER — Other Ambulatory Visit: Payer: Self-pay

## 2019-07-30 ENCOUNTER — Inpatient Hospital Stay: Payer: Medicare Other

## 2019-07-30 VITALS — BP 148/62 | HR 72 | Temp 98.2°F | Resp 16

## 2019-07-30 DIAGNOSIS — D464 Refractory anemia, unspecified: Secondary | ICD-10-CM

## 2019-07-30 DIAGNOSIS — D638 Anemia in other chronic diseases classified elsewhere: Secondary | ICD-10-CM

## 2019-07-30 DIAGNOSIS — E1165 Type 2 diabetes mellitus with hyperglycemia: Secondary | ICD-10-CM | POA: Diagnosis not present

## 2019-07-30 LAB — CBC WITH DIFFERENTIAL (CANCER CENTER ONLY)
Abs Immature Granulocytes: 0.01 10*3/uL (ref 0.00–0.07)
Basophils Absolute: 0 10*3/uL (ref 0.0–0.1)
Basophils Relative: 0 %
Eosinophils Absolute: 0.2 10*3/uL (ref 0.0–0.5)
Eosinophils Relative: 3 %
HCT: 34.8 % — ABNORMAL LOW (ref 36.0–46.0)
Hemoglobin: 10.8 g/dL — ABNORMAL LOW (ref 12.0–15.0)
Immature Granulocytes: 0 %
Lymphocytes Relative: 23 %
Lymphs Abs: 1.7 10*3/uL (ref 0.7–4.0)
MCH: 27.4 pg (ref 26.0–34.0)
MCHC: 31 g/dL (ref 30.0–36.0)
MCV: 88.3 fL (ref 80.0–100.0)
Monocytes Absolute: 0.7 10*3/uL (ref 0.1–1.0)
Monocytes Relative: 10 %
Neutro Abs: 4.6 10*3/uL (ref 1.7–7.7)
Neutrophils Relative %: 64 %
Platelet Count: 374 10*3/uL (ref 150–400)
RBC: 3.94 MIL/uL (ref 3.87–5.11)
RDW: 15.9 % — ABNORMAL HIGH (ref 11.5–15.5)
WBC Count: 7.2 10*3/uL (ref 4.0–10.5)
nRBC: 0 % (ref 0.0–0.2)

## 2019-07-30 LAB — IRON AND TIBC
Iron: 99 ug/dL (ref 41–142)
Saturation Ratios: 35 % (ref 21–57)
TIBC: 286 ug/dL (ref 236–444)
UIBC: 187 ug/dL (ref 120–384)

## 2019-07-30 LAB — TSH: TSH: 0.853 u[IU]/mL (ref 0.308–3.960)

## 2019-07-30 LAB — FERRITIN: Ferritin: 127 ng/mL (ref 11–307)

## 2019-07-30 MED ORDER — EPOETIN ALFA-EPBX 10000 UNIT/ML IJ SOLN
20000.0000 [IU] | Freq: Once | INTRAMUSCULAR | Status: AC
  Administered 2019-07-30: 11:00:00 20000 [IU] via SUBCUTANEOUS
  Filled 2019-07-30: qty 2

## 2019-07-30 NOTE — Patient Instructions (Signed)
Epoetin Alfa injection °What is this medicine? °EPOETIN ALFA (e POE e tin AL fa) helps your body make more red blood cells. This medicine is used to treat anemia caused by chronic kidney disease, cancer chemotherapy, or HIV-therapy. It may also be used before surgery if you have anemia. °This medicine may be used for other purposes; ask your health care provider or pharmacist if you have questions. °COMMON BRAND NAME(S): Epogen, Procrit, Retacrit °What should I tell my health care provider before I take this medicine? °They need to know if you have any of these conditions: °-cancer °-heart disease °-high blood pressure °-history of blood clots °-history of stroke °-low levels of folate, iron, or vitamin B12 in the blood °-seizures °-an unusual or allergic reaction to erythropoietin, albumin, benzyl alcohol, hamster proteins, other medicines, foods, dyes, or preservatives °-pregnant or trying to get pregnant °-breast-feeding °How should I use this medicine? °This medicine is for injection into a vein or under the skin. It is usually given by a health care professional in a hospital or clinic setting. °If you get this medicine at home, you will be taught how to prepare and give this medicine. Use exactly as directed. Take your medicine at regular intervals. Do not take your medicine more often than directed. °It is important that you put your used needles and syringes in a special sharps container. Do not put them in a trash can. If you do not have a sharps container, call your pharmacist or healthcare provider to get one. °A special MedGuide will be given to you by the pharmacist with each prescription and refill. Be sure to read this information carefully each time. °Talk to your pediatrician regarding the use of this medicine in children. While this drug may be prescribed for selected conditions, precautions do apply. °Overdosage: If you think you have taken too much of this medicine contact a poison control center  or emergency room at once. °NOTE: This medicine is only for you. Do not share this medicine with others. °What if I miss a dose? °If you miss a dose, take it as soon as you can. If it is almost time for your next dose, take only that dose. Do not take double or extra doses. °What may interact with this medicine? °Interactions have not been studied. °This list may not describe all possible interactions. Give your health care provider a list of all the medicines, herbs, non-prescription drugs, or dietary supplements you use. Also tell them if you smoke, drink alcohol, or use illegal drugs. Some items may interact with your medicine. °What should I watch for while using this medicine? °Your condition will be monitored carefully while you are receiving this medicine. °You may need blood work done while you are taking this medicine. °This medicine may cause a decrease in vitamin B6. You should make sure that you get enough vitamin B6 while you are taking this medicine. Discuss the foods you eat and the vitamins you take with your health care professional. °What side effects may I notice from receiving this medicine? °Side effects that you should report to your doctor or health care professional as soon as possible: °-allergic reactions like skin rash, itching or hives, swelling of the face, lips, or tongue °-seizures °-signs and symptoms of a blood clot such as breathing problems; changes in vision; chest pain; severe, sudden headache; pain, swelling, warmth in the leg; trouble speaking; sudden numbness or weakness of the face, arm or leg °-signs and symptoms of a stroke   like changes in vision; confusion; trouble speaking or understanding; severe headaches; sudden numbness or weakness of the face, arm or leg; trouble walking; dizziness; loss of balance or coordination °Side effects that usually do not require medical attention (report to your doctor or health care professional if they continue or are  bothersome): °-chills °-cough °-dizziness °-fever °-headaches °-joint pain °-muscle cramps °-muscle pain °-nausea, vomiting °-pain, redness, or irritation at site where injected °This list may not describe all possible side effects. Call your doctor for medical advice about side effects. You may report side effects to FDA at 1-800-FDA-1088. °Where should I keep my medicine? °Keep out of the reach of children. °Store in a refrigerator between 2 and 8 degrees C (36 and 46 degrees F). Do not freeze or shake. Throw away any unused portion if using a single-dose vial. Multi-dose vials can be kept in the refrigerator for up to 21 days after the initial dose. Throw away unused medicine. °NOTE: This sheet is a summary. It may not cover all possible information. If you have questions about this medicine, talk to your doctor, pharmacist, or health care provider. °© 2019 Elsevier/Gold Standard (2017-05-25 08:35:19) ° °

## 2019-07-31 ENCOUNTER — Other Ambulatory Visit: Payer: Self-pay | Admitting: Hematology

## 2019-07-31 ENCOUNTER — Telehealth: Payer: Self-pay

## 2019-07-31 DIAGNOSIS — D638 Anemia in other chronic diseases classified elsewhere: Secondary | ICD-10-CM

## 2019-07-31 NOTE — Telephone Encounter (Signed)
-----   Message from Truitt Merle, MD sent at 07/31/2019  3:05 PM EDT ----- Let pt know her thyroid function and iron study were normal yesterday, thanks   Truitt Merle  07/31/2019

## 2019-07-31 NOTE — Telephone Encounter (Signed)
Left patient a voice message with lab results.  Per Dr. Burr Medico notified her thyroid function and iron study were normal yesterday.  Encouraged her to call back if she has questions.

## 2019-08-07 ENCOUNTER — Other Ambulatory Visit: Payer: Self-pay

## 2019-08-07 MED ORDER — CARVEDILOL 6.25 MG PO TABS: 6.2500 mg | ORAL_TABLET | Freq: Two times a day (BID) | ORAL | 1 refills | Status: DC

## 2019-08-13 ENCOUNTER — Other Ambulatory Visit: Payer: Self-pay

## 2019-08-13 ENCOUNTER — Inpatient Hospital Stay: Payer: Medicare Other | Attending: Hematology

## 2019-08-13 ENCOUNTER — Inpatient Hospital Stay: Payer: Medicare Other

## 2019-08-13 DIAGNOSIS — E1165 Type 2 diabetes mellitus with hyperglycemia: Secondary | ICD-10-CM | POA: Insufficient documentation

## 2019-08-13 DIAGNOSIS — D638 Anemia in other chronic diseases classified elsewhere: Secondary | ICD-10-CM | POA: Diagnosis present

## 2019-08-13 LAB — CBC WITH DIFFERENTIAL (CANCER CENTER ONLY)
Abs Immature Granulocytes: 0.03 10*3/uL (ref 0.00–0.07)
Basophils Absolute: 0 10*3/uL (ref 0.0–0.1)
Basophils Relative: 0 %
Eosinophils Absolute: 0.2 10*3/uL (ref 0.0–0.5)
Eosinophils Relative: 2 %
HCT: 35.1 % — ABNORMAL LOW (ref 36.0–46.0)
Hemoglobin: 10.9 g/dL — ABNORMAL LOW (ref 12.0–15.0)
Immature Granulocytes: 0 %
Lymphocytes Relative: 19 %
Lymphs Abs: 1.5 10*3/uL (ref 0.7–4.0)
MCH: 27.7 pg (ref 26.0–34.0)
MCHC: 31.1 g/dL (ref 30.0–36.0)
MCV: 89.3 fL (ref 80.0–100.0)
Monocytes Absolute: 0.8 10*3/uL (ref 0.1–1.0)
Monocytes Relative: 10 %
Neutro Abs: 5.3 10*3/uL (ref 1.7–7.7)
Neutrophils Relative %: 69 %
Platelet Count: 370 10*3/uL (ref 150–400)
RBC: 3.93 MIL/uL (ref 3.87–5.11)
RDW: 16 % — ABNORMAL HIGH (ref 11.5–15.5)
WBC Count: 7.9 10*3/uL (ref 4.0–10.5)
nRBC: 0 % (ref 0.0–0.2)

## 2019-08-13 LAB — FERRITIN: Ferritin: 115 ng/mL (ref 11–307)

## 2019-08-13 NOTE — Progress Notes (Signed)
Per Dr Burr Medico hold if 10.9 or higher. Gave PT a copy of labs and instructed her to return in 2 weeks for labs.

## 2019-08-15 ENCOUNTER — Other Ambulatory Visit: Payer: Self-pay | Admitting: Cardiology

## 2019-08-15 ENCOUNTER — Telehealth: Payer: Self-pay | Admitting: Cardiology

## 2019-08-15 DIAGNOSIS — I10 Essential (primary) hypertension: Secondary | ICD-10-CM

## 2019-08-15 DIAGNOSIS — I5021 Acute systolic (congestive) heart failure: Secondary | ICD-10-CM

## 2019-08-15 NOTE — Telephone Encounter (Signed)
Pt. Says her feet are swollen and they have been swollen for about a week now. She says she would like a call back from the nurse to see what she can do

## 2019-08-15 NOTE — Telephone Encounter (Signed)
Called patient she reports leg and ankle swelling for 1 week that is getting worse. She reports "somone" she is unsure who was decreasing her lasix from 3 (20mg ) tablets  A Day down to 2 tablets a day then down to 1 tablet a day. However 2 days ago she started back on lasix 20 mg tablets two tablets a day herself. She is still having swelling and is concerned. She reports shortness of breath on exertion, and a 2lb weight gain. Will consult with Dr. Agustin Cree about the next steps.

## 2019-08-15 NOTE — Telephone Encounter (Signed)
Left message for patient to return call.

## 2019-08-15 NOTE — Telephone Encounter (Signed)
Patient called back. Informed her to have labs drawn on Monday and continue lasix 20 mg 2 tablets daily. She verbally understood, no further questions.

## 2019-08-15 NOTE — Telephone Encounter (Signed)
Keep 2 tabl of lasix daily  On Monday - chem7 and proBNP

## 2019-08-15 NOTE — Telephone Encounter (Signed)
Patient needs call back, still having issues with swelling

## 2019-08-15 NOTE — Telephone Encounter (Signed)
Pt. Returning call.

## 2019-08-19 LAB — BASIC METABOLIC PANEL
BUN/Creatinine Ratio: 21 (ref 12–28)
BUN: 37 mg/dL — ABNORMAL HIGH (ref 8–27)
CO2: 25 mmol/L (ref 20–29)
Calcium: 8.9 mg/dL (ref 8.7–10.3)
Chloride: 96 mmol/L (ref 96–106)
Creatinine, Ser: 1.75 mg/dL — ABNORMAL HIGH (ref 0.57–1.00)
GFR calc Af Amer: 32 mL/min/{1.73_m2} — ABNORMAL LOW (ref >59)
GFR calc non Af Amer: 28 mL/min/{1.73_m2} — ABNORMAL LOW (ref >59)
Glucose: 401 mg/dL — ABNORMAL HIGH (ref 65–99)
Potassium: 4.8 mmol/L (ref 3.5–5.2)
Sodium: 137 mmol/L (ref 134–144)

## 2019-08-19 LAB — PRO B NATRIURETIC PEPTIDE: NT-Pro BNP: 1864 pg/mL — ABNORMAL HIGH (ref 0–738)

## 2019-08-22 ENCOUNTER — Telehealth: Payer: Self-pay | Admitting: Hematology

## 2019-08-22 NOTE — Telephone Encounter (Signed)
Called patient regarding rescheduling injection appointment, informed patient she will still be needing her injection appointment per md's nurse and it is moved to 10/27.

## 2019-08-22 NOTE — Telephone Encounter (Signed)
Patient called to reschedule 10/28 lab appointment for 10/27, per patient's request appointment has been rescheduled.

## 2019-08-26 ENCOUNTER — Inpatient Hospital Stay: Payer: Medicare Other

## 2019-08-26 ENCOUNTER — Other Ambulatory Visit: Payer: Self-pay

## 2019-08-26 ENCOUNTER — Other Ambulatory Visit: Payer: Medicare Other

## 2019-08-26 VITALS — BP 126/58 | HR 76 | Temp 98.0°F | Resp 19

## 2019-08-26 DIAGNOSIS — D638 Anemia in other chronic diseases classified elsewhere: Secondary | ICD-10-CM

## 2019-08-26 DIAGNOSIS — E1165 Type 2 diabetes mellitus with hyperglycemia: Secondary | ICD-10-CM | POA: Diagnosis not present

## 2019-08-26 LAB — CBC WITH DIFFERENTIAL (CANCER CENTER ONLY)
Abs Immature Granulocytes: 0.02 10*3/uL (ref 0.00–0.07)
Basophils Absolute: 0 10*3/uL (ref 0.0–0.1)
Basophils Relative: 1 %
Eosinophils Absolute: 0.2 10*3/uL (ref 0.0–0.5)
Eosinophils Relative: 3 %
HCT: 31.9 % — ABNORMAL LOW (ref 36.0–46.0)
Hemoglobin: 10.2 g/dL — ABNORMAL LOW (ref 12.0–15.0)
Immature Granulocytes: 0 %
Lymphocytes Relative: 29 %
Lymphs Abs: 2 10*3/uL (ref 0.7–4.0)
MCH: 28.2 pg (ref 26.0–34.0)
MCHC: 32 g/dL (ref 30.0–36.0)
MCV: 88.1 fL (ref 80.0–100.0)
Monocytes Absolute: 0.8 10*3/uL (ref 0.1–1.0)
Monocytes Relative: 11 %
Neutro Abs: 3.9 10*3/uL (ref 1.7–7.7)
Neutrophils Relative %: 56 %
Platelet Count: 343 10*3/uL (ref 150–400)
RBC: 3.62 MIL/uL — ABNORMAL LOW (ref 3.87–5.11)
RDW: 15.1 % (ref 11.5–15.5)
WBC Count: 6.8 10*3/uL (ref 4.0–10.5)
nRBC: 0 % (ref 0.0–0.2)

## 2019-08-26 MED ORDER — EPOETIN ALFA-EPBX 10000 UNIT/ML IJ SOLN
INTRAMUSCULAR | Status: AC
  Filled 2019-08-26: qty 1

## 2019-08-26 MED ORDER — EPOETIN ALFA-EPBX 10000 UNIT/ML IJ SOLN
20000.0000 [IU] | Freq: Once | INTRAMUSCULAR | Status: AC
  Administered 2019-08-26: 12:00:00 20000 [IU] via SUBCUTANEOUS

## 2019-08-27 ENCOUNTER — Other Ambulatory Visit: Payer: Medicare Other

## 2019-08-27 ENCOUNTER — Ambulatory Visit: Payer: Medicare Other

## 2019-09-05 NOTE — Progress Notes (Signed)
Elizabeth Olsen   [phone] [phone]   Clinic Follow up Note   Patient Care Team: [name], MD as PCP - General (Family Medicine) Park Liter, MD as PCP - Cardiology (Cardiology) Reynold Bowen, MD as Consulting Physician (Endocrinology) Bobbye Charleston, MD as Consulting Physician (Obstetrics and Gynecology)  Date of Service:  09/10/2019  CHIEF COMPLAINT: F/u anemia of chronic disease   CURRENT THERAPY:  Aranesp injection 127mgmonthly started on 01/01/18. Due to insurance was changed to Retacrit every 2 weeks starting 03/11/19.  INTERVAL HISTORY:  Elizabeth CRESPIis here for a follow up of anemia. She presents to the clinic alone. She notes she is doing well. She notes she tolerated Retacrit. She notes fatigue but she contributes this to taking care of her husband. She notes she is still able to take care of him well. She denies chest pain or any discomfort. She notes she would like to have her injections on days of her husbands infusions in their clinic.    REVIEW OF SYSTEMS:   Constitutional: Denies fevers, chills or abnormal weight loss (+) fatigue, manageable  Eyes: Denies blurriness of vision Ears, nose, mouth, throat, and face: Denies mucositis or sore throat Respiratory: Denies cough, dyspnea or wheezes Cardiovascular: Denies palpitation, chest discomfort or lower extremity swelling Gastrointestinal:  Denies nausea, heartburn or change in bowel habits Skin: Denies abnormal skin rashes Lymphatics: Denies new lymphadenopathy or easy bruising Neurological:Denies numbness, tingling or new weaknesses Behavioral/Psych: Mood is stable, no new changes  All other systems were reviewed with the patient and are negative.  MEDICAL HISTORY:  Past Medical History:  Diagnosis Date  . Anemia   . Blood transfusion   . Blood transfusion without reported diagnosis    with breast surgery  . Breast cancer [place]   left   .  CHF (congestive heart failure) ([place]   . 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 Diabetes mellitus    dx 1998.  was told prior to getting chemo that her bld sugar rose.  She thought it would go back  . Dyspnea    d/t anemia  . GERD (gastroesophageal reflux disease)   . Heart murmur    as child  . Hernia, incisional   . Hyperlipidemia   . Hypertension   . Hypothyroidism   . Vitamin D deficiency     SURGICAL HISTORY: Past Surgical History:  Procedure Laterality Date  . ABDOMINAL HYSTERECTOMY    . APPENDECTOMY    . BREAST SURGERY    . CHOLECYSTECTOMY  01/22/2018   LAPROSCOPIC   . CHOLECYSTECTOMY N/A 01/22/2018   Procedure: LAPAROSCOPIC CHOLECYSTECTOMY WITH INTRAOPERATIVE CHOLANGIOGRAM;  Surgeon: TDonnie Mesa MD;  [place]  Service: General;  Laterality: N/A;  . COLONOSCOPY  2013   due next 03-2017  . EYE SURGERY     bil cataracts  . IUD REMOVAL     with appendectomy  . MASTECTOMY, MODIFIED RADICAL W/RECONSTRUCTION Left 15 years ago   10 nodes out  . TUMOR EXCISION Left    x 2, neck, head    I have reviewed the social history and family history with the patient and they are unchanged from previous note.  ALLERGIES:  is allergic to erythromycin; penicillins; nyquil [pseudoeph-doxylamine-dm-apap]; and sulfa antibiotics.  MEDICATIONS:  Current Outpatient Medications  Medication Sig Dispense Refill  . atorvastatin (LIPITOR) 10 MG tablet TAKE 1 TABLET BY MOUTH EVERY DAY ONE TIME ONLY (Patient taking differently: Take 10 mg  by mouth daily. ) 90 tablet 2  . calcium carbonate (OSCAL) 1500 (600 Ca) MG TABS tablet Take 600 mg by mouth daily with breakfast.     . carvedilol (COREG) 6.25 MG tablet Take 1 tablet (6.25 mg total) by mouth 2 (two) times daily. 180 tablet 1  . fenofibrate 160 MG tablet Take 160 mg by mouth daily.    . fish oil-omega-3 fatty acids 1000 MG capsule Take 1 g by mouth daily.     . furosemide (LASIX) 20 MG tablet Take 2 tablets (40 mg total) by mouth  daily. 60 tablet 0  . Insulin Glargine (LANTUS SOLOSTAR) 100 UNIT/ML Solostar Pen Inject 8 Units into the skin 2 (two) times daily.     . insulin lispro (HUMALOG KWIKPEN) 100 UNIT/ML KiwkPen Inject 5-20 Units into the skin 3 (three) times daily. Sliding Scale    . iron polysaccharides (NIFEREX) 150 MG capsule Take 150 mg by mouth 2 (two) times daily.     Marland Kitchen levothyroxine (SYNTHROID, LEVOTHROID) 112 MCG tablet Take 112 mcg by mouth See admin instructions. Take 1 tablet (112 mcg) by mouth on 6 days of the week; hold on Sundays.    Marland Kitchen losartan (COZAAR) 25 MG tablet TAKE 1 TABLET BY MOUTH DAILY 90 tablet 1  . Menthol-Methyl Salicylate (MUSCLE RUB) 10-15 % CREA Apply 1 application topically 4 (four) times daily as needed for muscle pain.     . mupirocin ointment (BACTROBAN) 2 % Apply 1 application topically 3 (three) times daily. 15 g 0  . ONETOUCH VERIO test strip USE TO TEST UP TO 10 TIMES D  12  . vitamin C (ASCORBIC ACID) 500 MG tablet Take 500 mg by mouth daily.    . Vitamin D, Ergocalciferol, (DRISDOL) 50000 UNITS CAPS Take 50,000 Units by mouth 2 (two) times a week. Sundays & Wednesdays.     No current facility-administered medications for this visit.     PHYSICAL EXAMINATION: ECOG PERFORMANCE STATUS: 1 - Symptomatic but completely ambulatory  Vitals:   09/10/19 1136  BP: (!) 153/74  Pulse: 78  Resp: 17  Temp: 98.2 F (36.8 C)  SpO2: 99%   Filed Weights   09/10/19 1136  Weight: 155 lb 14.4 oz (70.7 kg)    GENERAL:alert, no distress and comfortable SKIN: skin color, texture, turgor are normal, no rashes or significant lesions EYES: normal, Conjunctiva are pink and non-injected, sclera clear  NECK: supple, thyroid normal size, non-tender, without nodularity LYMPH:  no palpable lymphadenopathy in the cervical, axillary  LUNGS: clear to auscultation and percussion with normal breathing effort HEART: regular rate & rhythm and no murmurs and no lower extremity edema ABDOMEN:abdomen  soft, non-tender and normal bowel sounds Musculoskeletal:no cyanosis of digits and no clubbing  NEURO: alert & oriented x 3 with fluent speech, no focal motor/sensory deficits  LABORATORY DATA:  I have reviewed the data as listed CBC Latest Ref Rng & Units 09/10/2019 08/26/2019 08/13/2019  WBC 4.0 - 10.5 K/uL 6.9 6.8 7.9  Hemoglobin 12.0 - 15.0 g/dL 9.5(L) 10.2(L) 10.9(L)  Hematocrit 36.0 - 46.0 % 30.4(L) 31.9(L) 35.1(L)  Platelets 150 - 400 K/uL 372 343 370     CMP Latest Ref Rng & Units 09/10/2019 08/18/2019 03/03/2019  Glucose 70 - 99 mg/dL 222(H) 401(H) 105(H)  BUN 8 - 23 mg/dL 41(H) 37(H) 30(H)  Creatinine 0.44 - 1.00 mg/dL 1.79(H) 1.75(H) 1.31(H)  Sodium 135 - 145 mmol/L 138 137 139  Potassium 3.5 - 5.1 mmol/L 5.1 4.8 5.5(H)  Chloride  98 - 111 mmol/L 99 96 99  CO2 22 - 32 mmol/L 32 25 33(H)  Calcium 8.9 - 10.3 mg/dL 8.6(L) 8.9 9.1  Total Protein 6.5 - 8.1 g/dL 6.5 - 6.4(L)  Total Bilirubin 0.3 - 1.2 mg/dL 0.5 - <0.2(L)  Alkaline Phos 38 - 126 U/L 49 - 72  AST 15 - 41 U/L 27 - 25  ALT 0 - 44 U/L 12 - 23      RADIOGRAPHIC STUDIES: I have personally reviewed the radiological images as listed and agreed with the findings in the report. No results found.   ASSESSMENT & PLAN:  VENDELA TROUNG is a 78 y.o. female with   1. Anemia of chronic diseases  -I previously reviewed her CBC and anemia workup, she had normal ferritin, iron study, H74 and folic acid in 1638, ret was low, normal MCV. No lab evidence for hemolysis or multiple myeloma. Her erythropoietin level was normal. -She does have chronic disease including uncontrolled diabetes, congestive heart failure, and mild CKD, which can cause anemia of chronic disease -Herbone marrow biopsy in 11/21/17 showedno evidence of MDS or other primary marrow disease. I think this is consistent with anemia of chronic disease.  -Since her Hgb is below 9 most of time, she started monthly Aranesp injectionin 12/2017, she is tolerating  well.Due to insurance her Aranesp was changed to Retacrit every 2 weeks on 03/11/19 with goal Hg 9-11.We discussedthe risk of thrombosis from above, especially when hemoglobin is above 11. She is tolerating well, and will continue  -She is clinically doing well. Labs reviewed, Hg 9.5. This remains in range. Will proceed with Retacrit today. She continues to tolerate well. Will continue every 2 weeks  -Per pt request I will schedule her injections on the days of her husbands infusion here starting 09/23/19.  -F/u in 4 months   2. CHFand hypertension -Continue to f/u with cardiologist, Dr. Agustin Cree regularly. She takes 40 mg Lasixand potassium, HTN not well controlled.   3. DMwith hyperglycemia -DM poorly controlled.  -She will continue to f/u with Dr. Forde Dandy for management  4. Stage III CKD -Likely secondary to her diabetes.  -I previously advised her to drink plenty of fluids and to control her diabetes.   5. Hypothyroidism, Significant Fatigue   -She is on Levothyroxine. She is also on Vit D 50,000 units twice a week. This is managed by Dr. Forde Dandy -Her 07/01/19 TSH was low at 0.264, lower than normal. I suspect her significant fatigue is related to his thyroid function. I recommend she see Dr. Forde Dandy for dose adjustment.   6. Cancer screenings  -2020 Mammogram and DEXA was normal -Her last colonoscopy in 07/2017 with Dr. Cristobal Goldmann 1 benign polyp removed, otherwise normal.    PLAN: -Labs reviewed, stable. Will proceed with Retacrit today  -Continue Lab and Retacritinjection 20K U every 2 weeks  -F/u in 4 months   No problem-specific Assessment & Plan notes found for this encounter.   No orders of the defined types were placed in this encounter.  All questions were answered. The patient knows to call the clinic with any problems, questions or concerns. No barriers to learning was detected. I spent 10 minutes counseling the patient face to face. The total time spent  in the appointment was 15 minutes and more than 50% was on counseling and review of test results     Truitt Merle, MD 09/10/2019   I, Joslyn Devon, am acting as scribe for Truitt Merle, MD.  I have reviewed the above documentation for accuracy and completeness, and I agree with the above.       

## 2019-09-08 DIAGNOSIS — D2239 Melanocytic nevi of other parts of face: Secondary | ICD-10-CM | POA: Diagnosis not present

## 2019-09-08 DIAGNOSIS — L821 Other seborrheic keratosis: Secondary | ICD-10-CM | POA: Diagnosis not present

## 2019-09-08 DIAGNOSIS — L853 Xerosis cutis: Secondary | ICD-10-CM | POA: Diagnosis not present

## 2019-09-08 DIAGNOSIS — D239 Other benign neoplasm of skin, unspecified: Secondary | ICD-10-CM | POA: Diagnosis not present

## 2019-09-08 DIAGNOSIS — L603 Nail dystrophy: Secondary | ICD-10-CM | POA: Diagnosis not present

## 2019-09-08 DIAGNOSIS — Z23 Encounter for immunization: Secondary | ICD-10-CM | POA: Diagnosis not present

## 2019-09-10 ENCOUNTER — Inpatient Hospital Stay: Payer: Medicare Other | Attending: Hematology

## 2019-09-10 ENCOUNTER — Encounter: Payer: Self-pay | Admitting: Hematology

## 2019-09-10 ENCOUNTER — Inpatient Hospital Stay (HOSPITAL_BASED_OUTPATIENT_CLINIC_OR_DEPARTMENT_OTHER): Payer: Medicare Other | Admitting: Hematology

## 2019-09-10 ENCOUNTER — Inpatient Hospital Stay: Payer: Medicare Other

## 2019-09-10 ENCOUNTER — Other Ambulatory Visit: Payer: Self-pay

## 2019-09-10 VITALS — BP 153/74 | HR 78 | Temp 98.2°F | Resp 17 | Ht 62.5 in | Wt 155.9 lb

## 2019-09-10 DIAGNOSIS — D638 Anemia in other chronic diseases classified elsewhere: Secondary | ICD-10-CM

## 2019-09-10 DIAGNOSIS — E1165 Type 2 diabetes mellitus with hyperglycemia: Secondary | ICD-10-CM | POA: Insufficient documentation

## 2019-09-10 LAB — CBC WITH DIFFERENTIAL (CANCER CENTER ONLY)
Abs Immature Granulocytes: 0.01 10*3/uL (ref 0.00–0.07)
Basophils Absolute: 0 10*3/uL (ref 0.0–0.1)
Basophils Relative: 0 %
Eosinophils Absolute: 0.2 10*3/uL (ref 0.0–0.5)
Eosinophils Relative: 3 %
HCT: 30.4 % — ABNORMAL LOW (ref 36.0–46.0)
Hemoglobin: 9.5 g/dL — ABNORMAL LOW (ref 12.0–15.0)
Immature Granulocytes: 0 %
Lymphocytes Relative: 29 %
Lymphs Abs: 2 10*3/uL (ref 0.7–4.0)
MCH: 27.4 pg (ref 26.0–34.0)
MCHC: 31.3 g/dL (ref 30.0–36.0)
MCV: 87.6 fL (ref 80.0–100.0)
Monocytes Absolute: 0.8 10*3/uL (ref 0.1–1.0)
Monocytes Relative: 12 %
Neutro Abs: 3.9 10*3/uL (ref 1.7–7.7)
Neutrophils Relative %: 56 %
Platelet Count: 372 10*3/uL (ref 150–400)
RBC: 3.47 MIL/uL — ABNORMAL LOW (ref 3.87–5.11)
RDW: 16.1 % — ABNORMAL HIGH (ref 11.5–15.5)
WBC Count: 6.9 10*3/uL (ref 4.0–10.5)
nRBC: 0 % (ref 0.0–0.2)

## 2019-09-10 LAB — CMP (CANCER CENTER ONLY)
ALT: 12 U/L (ref 0–44)
AST: 27 U/L (ref 15–41)
Albumin: 3.2 g/dL — ABNORMAL LOW (ref 3.5–5.0)
Alkaline Phosphatase: 49 U/L (ref 38–126)
Anion gap: 7 (ref 5–15)
BUN: 41 mg/dL — ABNORMAL HIGH (ref 8–23)
CO2: 32 mmol/L (ref 22–32)
Calcium: 8.6 mg/dL — ABNORMAL LOW (ref 8.9–10.3)
Chloride: 99 mmol/L (ref 98–111)
Creatinine: 1.79 mg/dL — ABNORMAL HIGH (ref 0.44–1.00)
GFR, Est AFR Am: 31 mL/min — ABNORMAL LOW (ref >60)
GFR, Estimated: 27 mL/min — ABNORMAL LOW (ref >60)
Glucose, Bld: 222 mg/dL — ABNORMAL HIGH (ref 70–99)
Potassium: 5.1 mmol/L (ref 3.5–5.1)
Sodium: 138 mmol/L (ref 135–145)
Total Bilirubin: 0.5 mg/dL (ref 0.3–1.2)
Total Protein: 6.5 g/dL (ref 6.5–8.1)

## 2019-09-10 MED ORDER — EPOETIN ALFA-EPBX 10000 UNIT/ML IJ SOLN
INTRAMUSCULAR | Status: AC
  Filled 2019-09-10: qty 2

## 2019-09-10 MED ORDER — EPOETIN ALFA-EPBX 10000 UNIT/ML IJ SOLN
20000.0000 [IU] | Freq: Once | INTRAMUSCULAR | Status: AC
  Administered 2019-09-10: 20000 [IU] via SUBCUTANEOUS

## 2019-09-10 NOTE — Patient Instructions (Signed)

## 2019-09-11 ENCOUNTER — Telehealth: Payer: Self-pay | Admitting: Hematology

## 2019-09-11 NOTE — Telephone Encounter (Signed)
Scheduled appt per 11/11 los. ° °Spoke with pt and she is aware of her appt date and time. °

## 2019-09-23 ENCOUNTER — Other Ambulatory Visit: Payer: Self-pay

## 2019-09-23 ENCOUNTER — Inpatient Hospital Stay: Payer: Medicare Other

## 2019-09-23 VITALS — BP 152/68 | HR 72 | Temp 98.2°F | Resp 18

## 2019-09-23 DIAGNOSIS — D638 Anemia in other chronic diseases classified elsewhere: Secondary | ICD-10-CM

## 2019-09-23 DIAGNOSIS — E1165 Type 2 diabetes mellitus with hyperglycemia: Secondary | ICD-10-CM | POA: Diagnosis not present

## 2019-09-23 LAB — CBC WITH DIFFERENTIAL (CANCER CENTER ONLY)
Abs Immature Granulocytes: 0.01 10*3/uL (ref 0.00–0.07)
Basophils Absolute: 0 10*3/uL (ref 0.0–0.1)
Basophils Relative: 1 %
Eosinophils Absolute: 0.2 10*3/uL (ref 0.0–0.5)
Eosinophils Relative: 3 %
HCT: 32.5 % — ABNORMAL LOW (ref 36.0–46.0)
Hemoglobin: 10.2 g/dL — ABNORMAL LOW (ref 12.0–15.0)
Immature Granulocytes: 0 %
Lymphocytes Relative: 22 %
Lymphs Abs: 1.2 10*3/uL (ref 0.7–4.0)
MCH: 28.3 pg (ref 26.0–34.0)
MCHC: 31.4 g/dL (ref 30.0–36.0)
MCV: 90.3 fL (ref 80.0–100.0)
Monocytes Absolute: 0.7 10*3/uL (ref 0.1–1.0)
Monocytes Relative: 12 %
Neutro Abs: 3.4 10*3/uL (ref 1.7–7.7)
Neutrophils Relative %: 62 %
Platelet Count: 431 10*3/uL — ABNORMAL HIGH (ref 150–400)
RBC: 3.6 MIL/uL — ABNORMAL LOW (ref 3.87–5.11)
RDW: 16 % — ABNORMAL HIGH (ref 11.5–15.5)
WBC Count: 5.5 10*3/uL (ref 4.0–10.5)
nRBC: 0 % (ref 0.0–0.2)

## 2019-09-23 MED ORDER — EPOETIN ALFA-EPBX 10000 UNIT/ML IJ SOLN
INTRAMUSCULAR | Status: AC
  Filled 2019-09-23: qty 2

## 2019-09-23 MED ORDER — EPOETIN ALFA-EPBX 10000 UNIT/ML IJ SOLN
20000.0000 [IU] | Freq: Once | INTRAMUSCULAR | Status: AC
  Administered 2019-09-23: 20000 [IU] via SUBCUTANEOUS

## 2019-09-23 NOTE — Patient Instructions (Signed)

## 2019-09-30 ENCOUNTER — Ambulatory Visit: Payer: Medicare Other | Admitting: Sports Medicine

## 2019-10-02 ENCOUNTER — Other Ambulatory Visit: Payer: Self-pay

## 2019-10-02 ENCOUNTER — Encounter: Payer: Self-pay | Admitting: Cardiology

## 2019-10-02 ENCOUNTER — Ambulatory Visit (INDEPENDENT_AMBULATORY_CARE_PROVIDER_SITE_OTHER): Payer: Medicare Other | Admitting: Cardiology

## 2019-10-02 VITALS — BP 118/62 | HR 74 | Ht 62.5 in | Wt 154.8 lb

## 2019-10-02 DIAGNOSIS — I10 Essential (primary) hypertension: Secondary | ICD-10-CM | POA: Diagnosis not present

## 2019-10-02 DIAGNOSIS — E611 Iron deficiency: Secondary | ICD-10-CM

## 2019-10-02 DIAGNOSIS — I420 Dilated cardiomyopathy: Secondary | ICD-10-CM

## 2019-10-02 DIAGNOSIS — Z794 Long term (current) use of insulin: Secondary | ICD-10-CM

## 2019-10-02 DIAGNOSIS — E1139 Type 2 diabetes mellitus with other diabetic ophthalmic complication: Secondary | ICD-10-CM

## 2019-10-02 NOTE — Addendum Note (Signed)
Addended by: Ashok Norris on: 10/02/2019 11:36 AM   Modules accepted: Orders

## 2019-10-02 NOTE — Progress Notes (Signed)
Cardiology Office Note:    Date:  10/02/2019   ID:  Elizabeth Olsen, DOB Jul 30, 1941, MRN [id number]  PCP:  [name], MD  Cardiologist:  [name], MD    Referring MD: [name], MD   Chief Complaint  Patient presents with  . Follow-up  Doing well  History of Present Illness:    Elizabeth Olsen is a 78 y.o. female with past medical history significant for cardiomyopathy, luckily last year echocardiogram showed normalization of left ventricle ejection fraction, diabetes which is usually poorly controlled, anemia required multiple medications, chronic kidney failure.  She comes today to my office for follow-up.  Overall she complain of having some fatigue tiredness and shortness of breath but otherwise seems to be doing well.  Denies have any chest pain, tightness, pressure, burning in her chest.  Past Medical History:  Diagnosis Date  . Anemia   . Blood transfusion   . Blood transfusion without reported diagnosis    with breast surgery  . Breast cancer ([place]) 15 years ago   left   . CHF (congestive heart failure) ([place])   . 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 Diabetes mellitus    dx 1998.  was told prior to getting chemo that her bld sugar rose.  She thought it would go back  . Dyspnea    d/t anemia  . GERD (gastroesophageal reflux disease)   . Heart murmur    as child  . Hernia, incisional   . Hyperlipidemia   . Hypertension   . Hypothyroidism   . Vitamin D deficiency     Past Surgical History:  Procedure Laterality Date  . ABDOMINAL HYSTERECTOMY    . APPENDECTOMY    . BREAST SURGERY    . CHOLECYSTECTOMY  01/22/2018   LAPROSCOPIC   . CHOLECYSTECTOMY N/A 01/22/2018   Procedure: LAPAROSCOPIC CHOLECYSTECTOMY WITH INTRAOPERATIVE CHOLANGIOGRAM;  Surgeon: Donnie Mesa, MD;  Location: [facility];  Service: General;  Laterality: N/A;  . COLONOSCOPY  2013   due next 03-2017  . EYE SURGERY     bil cataracts  . IUD REMOVAL     with appendectomy  . MASTECTOMY,  MODIFIED RADICAL W/RECONSTRUCTION Left 15 years ago   10 nodes out  . TUMOR EXCISION Left    x 2, neck, head    Current Medications: Current Meds  Medication Sig  . atorvastatin (LIPITOR) 10 MG tablet TAKE 1 TABLET BY MOUTH EVERY DAY ONE TIME ONLY (Patient taking differently: Take 10 mg by mouth daily. )  . calcium carbonate (OSCAL) 1500 (600 Ca) MG TABS tablet Take 600 mg by mouth daily with breakfast.   . carvedilol (COREG) 6.25 MG tablet Take 1 tablet (6.25 mg total) by mouth 2 (two) times daily.  . fenofibrate 160 MG tablet Take 160 mg by mouth daily.  . fish oil-omega-3 fatty acids 1000 MG capsule Take 1 g by mouth daily.   . furosemide (LASIX) 20 MG tablet Take 2 tablets (40 mg total) by mouth daily.  . Insulin Glargine (LANTUS SOLOSTAR) 100 UNIT/ML Solostar Pen Inject 8 Units into the skin 2 (two) times daily.   . insulin lispro (HUMALOG KWIKPEN) 100 UNIT/ML KiwkPen Inject 5-20 Units into the skin 3 (three) times daily. Sliding Scale  . iron polysaccharides (NIFEREX) 150 MG capsule Take 150 mg by mouth 2 (two) times daily.   Marland Kitchen levothyroxine (SYNTHROID, LEVOTHROID) 112 MCG tablet Take 112 mcg by mouth See admin instructions. Take 1 tablet (112 mcg) by mouth on  6 days of the week; hold on Sundays.  Marland Kitchen losartan (COZAAR) 25 MG tablet TAKE 1 TABLET BY MOUTH DAILY  . ONETOUCH VERIO test strip USE TO TEST UP TO 10 TIMES D  . vitamin C (ASCORBIC ACID) 500 MG tablet Take 500 mg by mouth daily.  . Vitamin D, Ergocalciferol, (DRISDOL) 50000 UNITS CAPS Take 50,000 Units by mouth 2 (two) times a week. Sundays & Wednesdays.     Allergies:   Erythromycin, Penicillins, Nyquil [pseudoeph-doxylamine-dm-apap], and Sulfa antibiotics   Social History   Socioeconomic History  . Marital status: Married    Spouse name: Not on file  . Number of children: 3  . Years of education: Not on file  . Highest education level: Not on file  Occupational History  . Occupation: homemaker  Social Needs  .  Financial resource strain: Not on file  . Food insecurity    Worry: Not on file    Inability: Not on file  . Transportation needs    Medical: Not on file    Non-medical: Not on file  Tobacco Use  . Smoking status: Never Smoker  . Smokeless tobacco: Never Used  Substance and Sexual Activity  . Alcohol use: No  . Drug use: No  . Sexual activity: Not on file  Lifestyle  . Physical activity    Days per week: Not on file    Minutes per session: Not on file  . Stress: Not on file  Relationships  . Social Herbalist on phone: Not on file    Gets together: Not on file    Attends religious service: Not on file    Active member of club or organization: Not on file    Attends meetings of clubs or organizations: Not on file    Relationship status: Not on file  Other Topics Concern  . Not on file  Social History Narrative  . Not on file     Family History: The patient's family history includes Congestive Heart Failure in her father; Diabetes in an other family member; Heart attack in her brother; Hypertension in her father, maternal grandfather, and maternal grandmother; Peripheral vascular disease in her father; Stomach cancer in her maternal grandmother. There is no history of Colon cancer, Esophageal cancer, Pancreatic cancer, or Rectal cancer. ROS:   Please see the history of present illness.    All 14 point review of systems negative except as described per history of present illness  EKGs/Labs/Other Studies Reviewed:      Recent Labs: 07/30/2019: TSH 0.853 08/18/2019: NT-Pro BNP 1,864 09/10/2019: ALT 12; BUN 41; Creatinine 1.79; Potassium 5.1; Sodium 138 09/23/2019: Hemoglobin 10.2; Platelet Count 431  Recent Lipid Panel    Component Value Date/Time   CHOL 184 07/23/2017 1538   TRIG 91 07/23/2017 1538   HDL 77 07/23/2017 1538   CHOLHDL 2.4 07/23/2017 1538   LDLCALC 89 07/23/2017 1538    Physical Exam:    VS:  BP 118/62   Pulse 74   Ht 5' 2.5" (1.588 m)    Wt 154 lb 12.8 oz (70.2 kg)   SpO2 98%   BMI 27.86 kg/m     Wt Readings from Last 3 Encounters:  10/02/19 154 lb 12.8 oz (70.2 kg)  09/10/19 155 lb 14.4 oz (70.7 kg)  07/02/19 149 lb 1.6 oz (67.6 kg)     GEN:  Well nourished, well developed in no acute distress HEENT: Normal NECK: No JVD; No carotid bruits LYMPHATICS: No lymphadenopathy  CARDIAC: RRR, no murmurs, no rubs, no gallops RESPIRATORY:  Clear to auscultation without rales, wheezing or rhonchi  ABDOMEN: Soft, non-tender, non-distended MUSCULOSKELETAL:  No edema; No deformity  SKIN: Warm and dry LOWER EXTREMITIES: no swelling NEUROLOGIC:  Alert and oriented x 3 PSYCHIATRIC:  Normal affect   ASSESSMENT:    1. Dilated cardiomyopathy ([place])   2. Essential hypertension   3. Type 2 diabetes mellitus with other ophthalmic complication, with long-term current use of insulin ([place])   4. Iron deficiency    PLAN:    In order of problems listed above:  1. Dilated cardiomyopathy time to repeat echocardiogram which I will do. 2. Essential hypertension blood pressure well controlled we will continue present management. 3. Type 2 diabetes she is always very poor control however now lately she is making some progress her hemoglobin A1c from 12 1 down to 9.  I congratulated her for it and encouraged her to stay away from sweets. 4. Anemia which is iron deficiency followed by internal medicine team. 5. She has been question about potassium she said that she takes some potassium and her potassium was high however I do not have any potassium listed on her medication list.  Asked her to call us later today and tell us what potassium how much she is taking.   Medication Adjustments/Labs and Tests Ordered: Current medicines are reviewed at length with the patient today.  Concerns regarding medicines are outlined above.  No orders of the defined types were placed in this encounter.  Medication changes: No orders of the defined types were  placed in this encounter.   Signed, Park Liter, MD, [facility] 10/02/2019 11:11 AM    [place]

## 2019-10-02 NOTE — Patient Instructions (Signed)
Medication Instructions:  Your physician recommends that you continue on your current medications as directed. Please refer to the Current Medication list given to you today.  *If you need a refill on your cardiac medications before your next appointment, please call your pharmacy*  Lab Work: None.  If you have labs (blood work) drawn today and your tests are completely normal, you will receive your results only by: Marland Kitchen MyChart Message (if you have MyChart) OR . A paper copy in the mail If you have any lab test that is abnormal or we need to change your treatment, we will call you to review the results.  Testing/Procedures: Your physician has requested that you have an echocardiogram. Echocardiography is a painless test that uses sound waves to create images of your heart. It provides your doctor with information about the size and shape of your heart and how well your heart's chambers and valves are working. This procedure takes approximately one hour. There are no restrictions for this procedure.    Follow-Up: At [facility], you and your health needs are our priority.  As part of our continuing mission to provide you with exceptional heart care, we have created designated Provider Care Teams.  These Care Teams include your primary Cardiologist (physician) and Advanced Practice Providers (APPs -  Physician Assistants and Nurse Practitioners) who all work together to provide you with the care you need, when you need it.  Your next appointment:   5 month(s)  The format for your next appointment:   In Person  Provider:   You may see [name], MD or the following Advanced Practice Provider on your designated Care Team:    Laurann Montana, FNP   Other Instructions   Echocardiogram An echocardiogram is a procedure that uses painless sound waves (ultrasound) to produce an image of the heart. Images from an echocardiogram can provide important information about:  Signs of  coronary artery disease (CAD).  Aneurysm detection. An aneurysm is a weak or damaged part of an artery wall that bulges out from the normal force of blood pumping through the body.  Heart size and shape. Changes in the size or shape of the heart can be associated with certain conditions, including heart failure, aneurysm, and CAD.  Heart muscle function.  Heart valve function.  Signs of a past heart attack.  Fluid buildup around the heart.  Thickening of the heart muscle.  A tumor or infectious growth around the heart valves. Tell a health care provider about:  Any allergies you have.  All medicines you are taking, including vitamins, herbs, eye drops, creams, and over-the-counter medicines.  Any blood disorders you have.  Any surgeries you have had.  Any medical conditions you have.  Whether you are pregnant or may be pregnant. What are the risks? Generally, this is a safe procedure. However, problems may occur, including:  Allergic reaction to dye (contrast) that may be used during the procedure. What happens before the procedure? No specific preparation is needed. You may eat and drink normally. What happens during the procedure?   An IV tube may be inserted into one of your veins.  You may receive contrast through this tube. A contrast is an injection that improves the quality of the pictures from your heart.  A gel will be applied to your chest.  A wand-like tool (transducer) will be moved over your chest. The gel will help to transmit the sound waves from the transducer.  The sound waves will harmlessly  bounce off of your heart to allow the heart images to be captured in real-time motion. The images will be recorded on a computer. The procedure may vary among health care providers and hospitals. What happens after the procedure?  You may return to your normal, everyday life, including diet, activities, and medicines, unless your health care provider tells you  not to do that. Summary  An echocardiogram is a procedure that uses painless sound waves (ultrasound) to produce an image of the heart.  Images from an echocardiogram can provide important information about the size and shape of your heart, heart muscle function, heart valve function, and fluid buildup around your heart.  You do not need to do anything to prepare before this procedure. You may eat and drink normally.  After the echocardiogram is completed, you may return to your normal, everyday life, unless your health care provider tells you not to do that. This information is not intended to replace advice given to you by your health care provider. Make sure you discuss any questions you have with your health care provider. Document Released: 10/13/2000 Document Revised: 02/06/2019 Document Reviewed: 11/18/2016 Elsevier Patient Education  2020 [employer].

## 2019-10-04 ENCOUNTER — Ambulatory Visit (INDEPENDENT_AMBULATORY_CARE_PROVIDER_SITE_OTHER): Payer: Medicare Other | Admitting: Podiatry

## 2019-10-04 ENCOUNTER — Encounter: Payer: Self-pay | Admitting: Podiatry

## 2019-10-04 ENCOUNTER — Other Ambulatory Visit: Payer: Self-pay

## 2019-10-04 VITALS — BP 162/80 | HR 82

## 2019-10-04 DIAGNOSIS — B351 Tinea unguium: Secondary | ICD-10-CM | POA: Diagnosis not present

## 2019-10-04 DIAGNOSIS — L603 Nail dystrophy: Secondary | ICD-10-CM | POA: Diagnosis not present

## 2019-10-04 DIAGNOSIS — Z794 Long term (current) use of insulin: Secondary | ICD-10-CM

## 2019-10-04 DIAGNOSIS — E1139 Type 2 diabetes mellitus with other diabetic ophthalmic complication: Secondary | ICD-10-CM | POA: Diagnosis not present

## 2019-10-04 NOTE — Patient Instructions (Signed)

## 2019-10-06 ENCOUNTER — Encounter: Payer: Self-pay | Admitting: Podiatry

## 2019-10-06 DIAGNOSIS — E559 Vitamin D deficiency, unspecified: Secondary | ICD-10-CM | POA: Insufficient documentation

## 2019-10-06 NOTE — Progress Notes (Signed)
Subjective:  Patient ID: Elizabeth Olsen, female    DOB: 02/26/1941,  MRN: [id number]  Chief Complaint  Patient presents with  . Nail Problem    (NP) Bilateral great toe pain, needs nails removed - she had fake nails put on both great toes back in February and needs the pieces that are left removed  . Diabetes    A1C  9.5    78 y.o. female presents with the above complaint.  Patient states that she had fake nails put on the both great toenails back in February however they have not come off.  She states that these toenails have been very painful.  She states that she has not done anything to take care of the toenails.  However she has not done much to remove the toenails because she is diabetic.  She states that her sugars are good her last A1c was 9.5.  She has palpable pulses.  I explained to her that it is important to control her sugars and I would like her sugars to be below 7.  She denies any other acute complaints.   Review of Systems: Negative except as noted in the HPI. Denies N/V/F/Ch.  Past Medical History:  Diagnosis Date  . Anemia   . Blood transfusion   . Blood transfusion without reported diagnosis    with breast surgery  . Breast cancer ([place]) 15 years ago   left   . CHF (congestive heart failure) ([place])   . 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 Diabetes mellitus    dx 1998.  was told prior to getting chemo that her bld sugar rose.  She thought it would go back  . Dyspnea    d/t anemia  . GERD (gastroesophageal reflux disease)   . Heart murmur    as child  . Hernia, incisional   . Hyperlipidemia   . Hypertension   . Hypothyroidism   . Vitamin D deficiency     Current Outpatient Medications:  .  atorvastatin (LIPITOR) 10 MG tablet, TAKE 1 TABLET BY MOUTH EVERY DAY ONE TIME ONLY (Patient taking differently: Take 10 mg by mouth daily. ), Disp: 90 tablet, Rfl: 2 .  calcium carbonate (OSCAL) 1500 (600 Ca) MG TABS tablet, Take 600 mg by mouth daily with breakfast. ,  Disp: , Rfl:  .  carvedilol (COREG) 6.25 MG tablet, Take 1 tablet (6.25 mg total) by mouth 2 (two) times daily., Disp: 180 tablet, Rfl: 1 .  fenofibrate 160 MG tablet, Take 160 mg by mouth daily., Disp: , Rfl:  .  fish oil-omega-3 fatty acids 1000 MG capsule, Take 1 g by mouth daily. , Disp: , Rfl:  .  furosemide (LASIX) 20 MG tablet, Take 2 tablets (40 mg total) by mouth daily., Disp: 60 tablet, Rfl: 0 .  Insulin Glargine (LANTUS SOLOSTAR) 100 UNIT/ML Solostar Pen, Inject 8 Units into the skin 2 (two) times daily. , Disp: , Rfl:  .  insulin lispro (HUMALOG KWIKPEN) 100 UNIT/ML KiwkPen, Inject 5-20 Units into the skin 3 (three) times daily. Sliding Scale, Disp: , Rfl:  .  iron polysaccharides (NIFEREX) 150 MG capsule, Take 150 mg by mouth 2 (two) times daily. , Disp: , Rfl:  .  levothyroxine (SYNTHROID, LEVOTHROID) 112 MCG tablet, Take 112 mcg by mouth See admin instructions. Take 1 tablet (112 mcg) by mouth on 6 days of the week; hold on Sundays., Disp: , Rfl:  .  losartan (COZAAR) 25 MG tablet, TAKE 1 TABLET BY  MOUTH DAILY, Disp: 90 tablet, Rfl: 1 .  ONETOUCH VERIO test strip, USE TO TEST UP TO 10 TIMES D, Disp: , Rfl: 12 .  vitamin C (ASCORBIC ACID) 500 MG tablet, Take 500 mg by mouth daily., Disp: , Rfl:  .  Vitamin D, Ergocalciferol, (DRISDOL) 50000 UNITS CAPS, Take 50,000 Units by mouth 2 (two) times a week. Sundays & Wednesdays., Disp: , Rfl:   Social History   Tobacco Use  Smoking Status Never Smoker  Smokeless Tobacco Never Used    Allergies  Allergen Reactions  . Erythromycin Shortness Of Breath and Swelling    "throat swelling", sob, thrashing ,   . Penicillins Rash    Has patient had a PCN reaction causing immediate rash, facial/tongue/throat swelling, SOB or lightheadedness with hypotension: Unknown Has patient had a PCN reaction causing severe rash involving mucus membranes or skin necrosis: No Has patient had a PCN reaction that required hospitalization: No Has patient had  a PCN reaction occurring within the last 10 years: No If all of the above answers are "NO", then may proceed with Cephalosporin use.   Marland Kitchen Nyquil [Pseudoeph-Doxylamine-Dm-Apap] Rash  . Sulfa Antibiotics Rash   Objective:   Vitals:   10/04/19 0826  BP: (!) 162/80  Pulse: 82   There is no height or weight on file to calculate BMI. Constitutional Well developed. Well nourished.  Vascular Dorsalis pedis pulses palpable bilaterally. Posterior tibial pulses palpable bilaterally. Capillary refill normal to all digits.  No cyanosis or clubbing noted. Pedal hair growth normal.  Neurologic Normal speech. Oriented to person, place, and time. Epicritic sensation to light touch grossly present bilaterally.  Dermatologic Pain on palpation of the entire/total nail on 1st digit of the bilaterally No other open wounds. No skin lesions.  Orthopedic: Normal joint ROM without pain or crepitus bilaterally. No visible deformities. No bony tenderness.   Radiographs: None Assessment:  No diagnosis found. Plan:  Patient was evaluated and treated and all questions answered.  Nail contusion/dystrophy bilateral hallux, bilaterally -Patient elects to proceed with minor surgery to remove entire toenail today. Consent reviewed and signed by patient. -Entire/total nail excised. See procedure note. -Educated on post-procedure care including soaking. Written instructions provided and reviewed. -Patient to follow up in 2 weeks for nail check.  Procedure: Excision of entire/total nail  Location: Bilateral 1st toe digit Anesthesia: Lidocaine 1% plain; 1.5 mL and Marcaine 0.5% plain; 1.5 mL, digital block. Skin Prep: Betadine. Dressing: Silvadene; telfa; dry, sterile, compression dressing. Technique: Following skin prep, the toe was exsanguinated and a tourniquet was secured at the base of the toe. The affected nail border was freed and excised. The tourniquet was then removed and sterile dressing applied.  Disposition: Patient tolerated procedure well. Patient to return in 2 weeks for follow-up.   Return in about 2 weeks (around 10/18/2019) for bilateral nail check.

## 2019-10-07 ENCOUNTER — Other Ambulatory Visit: Payer: Self-pay

## 2019-10-07 ENCOUNTER — Inpatient Hospital Stay: Payer: Medicare Other

## 2019-10-07 ENCOUNTER — Inpatient Hospital Stay: Payer: Medicare Other | Attending: Hematology

## 2019-10-07 VITALS — BP 153/70 | HR 77 | Temp 98.0°F | Resp 20

## 2019-10-07 DIAGNOSIS — N189 Chronic kidney disease, unspecified: Secondary | ICD-10-CM | POA: Insufficient documentation

## 2019-10-07 DIAGNOSIS — D638 Anemia in other chronic diseases classified elsewhere: Secondary | ICD-10-CM

## 2019-10-07 LAB — CBC WITH DIFFERENTIAL (CANCER CENTER ONLY)
Abs Immature Granulocytes: 0.02 10*3/uL (ref 0.00–0.07)
Basophils Absolute: 0.1 10*3/uL (ref 0.0–0.1)
Basophils Relative: 1 %
Eosinophils Absolute: 0.2 10*3/uL (ref 0.0–0.5)
Eosinophils Relative: 3 %
HCT: 32.7 % — ABNORMAL LOW (ref 36.0–46.0)
Hemoglobin: 10.3 g/dL — ABNORMAL LOW (ref 12.0–15.0)
Immature Granulocytes: 0 %
Lymphocytes Relative: 23 %
Lymphs Abs: 1.6 10*3/uL (ref 0.7–4.0)
MCH: 28.8 pg (ref 26.0–34.0)
MCHC: 31.5 g/dL (ref 30.0–36.0)
MCV: 91.3 fL (ref 80.0–100.0)
Monocytes Absolute: 0.8 10*3/uL (ref 0.1–1.0)
Monocytes Relative: 11 %
Neutro Abs: 4.3 10*3/uL (ref 1.7–7.7)
Neutrophils Relative %: 62 %
Platelet Count: 367 10*3/uL (ref 150–400)
RBC: 3.58 MIL/uL — ABNORMAL LOW (ref 3.87–5.11)
RDW: 15.9 % — ABNORMAL HIGH (ref 11.5–15.5)
WBC Count: 6.9 10*3/uL (ref 4.0–10.5)
nRBC: 0 % (ref 0.0–0.2)

## 2019-10-07 MED ORDER — EPOETIN ALFA-EPBX 10000 UNIT/ML IJ SOLN
INTRAMUSCULAR | Status: AC
  Filled 2019-10-07: qty 2

## 2019-10-07 MED ORDER — EPOETIN ALFA-EPBX 10000 UNIT/ML IJ SOLN
20000.0000 [IU] | Freq: Once | INTRAMUSCULAR | Status: AC
  Administered 2019-10-07: 20000 [IU] via SUBCUTANEOUS

## 2019-10-07 NOTE — Patient Instructions (Signed)

## 2019-10-08 ENCOUNTER — Ambulatory Visit (HOSPITAL_BASED_OUTPATIENT_CLINIC_OR_DEPARTMENT_OTHER)
Admission: RE | Admit: 2019-10-08 | Discharge: 2019-10-08 | Disposition: A | Discharge status: Home / Self Care | Payer: Medicare Other | Source: Ambulatory Visit | Attending: Cardiology | Admitting: Cardiology

## 2019-10-08 DIAGNOSIS — I420 Dilated cardiomyopathy: Secondary | ICD-10-CM | POA: Diagnosis not present

## 2019-10-08 NOTE — Progress Notes (Signed)
  Echocardiogram 2D Echocardiogram has been performed.  Cardell Peach 10/08/2019, 10:03 AM

## 2019-10-17 ENCOUNTER — Encounter: Payer: Self-pay | Admitting: Podiatry

## 2019-10-17 ENCOUNTER — Other Ambulatory Visit: Payer: Self-pay

## 2019-10-17 ENCOUNTER — Ambulatory Visit (INDEPENDENT_AMBULATORY_CARE_PROVIDER_SITE_OTHER): Payer: Medicare Other | Admitting: Podiatry

## 2019-10-17 DIAGNOSIS — L603 Nail dystrophy: Secondary | ICD-10-CM | POA: Diagnosis not present

## 2019-10-17 DIAGNOSIS — E1139 Type 2 diabetes mellitus with other diabetic ophthalmic complication: Secondary | ICD-10-CM

## 2019-10-17 DIAGNOSIS — B351 Tinea unguium: Secondary | ICD-10-CM | POA: Diagnosis not present

## 2019-10-17 DIAGNOSIS — Z794 Long term (current) use of insulin: Secondary | ICD-10-CM

## 2019-10-17 NOTE — Progress Notes (Signed)
Subjective: Elizabeth Olsen is a 78 y.o. female returns to office today for follow up evaluation after having bilateral hallux total nail avulsion performed. Patient has been soaking using Epson salt and applying topical antibiotic covered with bandaid daily. Patient denies fevers, chills, nausea, vomiting. Denies any calf pain, chest pain, SOB.   Objective:  Vitals: Reviewed  General: Well developed, nourished, in no acute distress, alert and oriented x3   Dermatology: Skin is warm, dry and supple bilateral.  Bilateral hallux total nail avulsion site appears to be clean, dry, with mild granular tissue and surrounding scab. There is no surrounding erythema, edema, drainage/purulence. The remaining nails appear unremarkable at this time. There are no other lesions or other signs of infection present.  Neurovascular status: Intact. No lower extremity swelling; No pain with calf compression bilateral.  Musculoskeletal: Decreased tenderness to palpation of the site of the total hallux nail avulsion. Muscular strength within normal limits bilateral.   Assesement and Plan: S/p total nail avulsion, doing well.   -Continue soaking in epsom salts twice a day followed by antibiotic ointment and a band-aid. Can leave uncovered at night. Continue this until completely healed.  -If the area has not healed in 2 weeks, call the office for follow-up appointment, or sooner if any problems arise.  -Monitor for any signs/symptoms of infection. Call the office immediately if any occur or go directly to the emergency room. Call with any questions/concerns.  Boneta Lucks, DPM

## 2019-10-20 ENCOUNTER — Ambulatory Visit: Payer: Medicare Other | Attending: Internal Medicine

## 2019-10-20 DIAGNOSIS — Z20822 Contact with and (suspected) exposure to covid-19: Secondary | ICD-10-CM

## 2019-10-21 ENCOUNTER — Inpatient Hospital Stay: Payer: Medicare Other

## 2019-10-21 LAB — NOVEL CORONAVIRUS, NAA: SARS-CoV-2, NAA: NOT DETECTED

## 2019-10-22 ENCOUNTER — Telehealth: Payer: Self-pay

## 2019-10-22 ENCOUNTER — Telehealth: Payer: Self-pay | Admitting: Hematology

## 2019-10-22 NOTE — Telephone Encounter (Signed)
Returned patient's phone call regarding rescheduling 12/22 cancelled appointment, per patient's request appointment has moved to 12/24.

## 2019-10-22 NOTE — Telephone Encounter (Signed)
Pt notified of negative COVID-19 results. Understanding verbalized.  Chasta M Hopkins   

## 2019-10-23 ENCOUNTER — Other Ambulatory Visit: Payer: Self-pay

## 2019-10-23 ENCOUNTER — Inpatient Hospital Stay: Payer: Medicare Other

## 2019-10-23 VITALS — BP 133/64 | HR 76 | Temp 98.2°F | Resp 19

## 2019-10-23 DIAGNOSIS — D638 Anemia in other chronic diseases classified elsewhere: Secondary | ICD-10-CM | POA: Diagnosis not present

## 2019-10-23 LAB — CBC WITH DIFFERENTIAL (CANCER CENTER ONLY)
Abs Immature Granulocytes: 0.01 10*3/uL (ref 0.00–0.07)
Basophils Absolute: 0 10*3/uL (ref 0.0–0.1)
Basophils Relative: 1 %
Eosinophils Absolute: 0.2 10*3/uL (ref 0.0–0.5)
Eosinophils Relative: 3 %
HCT: 29.5 % — ABNORMAL LOW (ref 36.0–46.0)
Hemoglobin: 9.3 g/dL — ABNORMAL LOW (ref 12.0–15.0)
Immature Granulocytes: 0 %
Lymphocytes Relative: 25 %
Lymphs Abs: 1.8 10*3/uL (ref 0.7–4.0)
MCH: 28.5 pg (ref 26.0–34.0)
MCHC: 31.5 g/dL (ref 30.0–36.0)
MCV: 90.5 fL (ref 80.0–100.0)
Monocytes Absolute: 0.7 10*3/uL (ref 0.1–1.0)
Monocytes Relative: 10 %
Neutro Abs: 4.3 10*3/uL (ref 1.7–7.7)
Neutrophils Relative %: 61 %
Platelet Count: 393 10*3/uL (ref 150–400)
RBC: 3.26 MIL/uL — ABNORMAL LOW (ref 3.87–5.11)
RDW: 15.7 % — ABNORMAL HIGH (ref 11.5–15.5)
WBC Count: 7.1 10*3/uL (ref 4.0–10.5)
nRBC: 0 % (ref 0.0–0.2)

## 2019-10-23 MED ORDER — EPOETIN ALFA-EPBX 10000 UNIT/ML IJ SOLN
INTRAMUSCULAR | Status: AC
  Filled 2019-10-23: qty 2

## 2019-10-23 MED ORDER — EPOETIN ALFA-EPBX 10000 UNIT/ML IJ SOLN
20000.0000 [IU] | Freq: Once | INTRAMUSCULAR | Status: AC
  Administered 2019-10-23: 20000 [IU] via SUBCUTANEOUS

## 2019-10-23 NOTE — Patient Instructions (Signed)

## 2019-10-27 ENCOUNTER — Ambulatory Visit: Payer: Medicare Other

## 2019-10-27 ENCOUNTER — Other Ambulatory Visit: Payer: Medicare Other

## 2019-11-04 ENCOUNTER — Other Ambulatory Visit: Payer: Self-pay

## 2019-11-04 ENCOUNTER — Inpatient Hospital Stay: Payer: Medicare Other

## 2019-11-04 ENCOUNTER — Inpatient Hospital Stay: Payer: Medicare Other | Attending: Hematology

## 2019-11-04 VITALS — BP 128/72 | HR 78 | Temp 98.2°F | Resp 18

## 2019-11-04 DIAGNOSIS — N189 Chronic kidney disease, unspecified: Secondary | ICD-10-CM | POA: Insufficient documentation

## 2019-11-04 DIAGNOSIS — D638 Anemia in other chronic diseases classified elsewhere: Secondary | ICD-10-CM

## 2019-11-04 LAB — CBC WITH DIFFERENTIAL (CANCER CENTER ONLY)
Abs Immature Granulocytes: 0.02 10*3/uL (ref 0.00–0.07)
Basophils Absolute: 0 10*3/uL (ref 0.0–0.1)
Basophils Relative: 1 %
Eosinophils Absolute: 0.2 10*3/uL (ref 0.0–0.5)
Eosinophils Relative: 3 %
HCT: 30.8 % — ABNORMAL LOW (ref 36.0–46.0)
Hemoglobin: 9.7 g/dL — ABNORMAL LOW (ref 12.0–15.0)
Immature Granulocytes: 0 %
Lymphocytes Relative: 17 %
Lymphs Abs: 1.3 10*3/uL (ref 0.7–4.0)
MCH: 28.4 pg (ref 26.0–34.0)
MCHC: 31.5 g/dL (ref 30.0–36.0)
MCV: 90.1 fL (ref 80.0–100.0)
Monocytes Absolute: 0.7 10*3/uL (ref 0.1–1.0)
Monocytes Relative: 10 %
Neutro Abs: 5.2 10*3/uL (ref 1.7–7.7)
Neutrophils Relative %: 69 %
Platelet Count: 384 10*3/uL (ref 150–400)
RBC: 3.42 MIL/uL — ABNORMAL LOW (ref 3.87–5.11)
RDW: 15.4 % (ref 11.5–15.5)
WBC Count: 7.5 10*3/uL (ref 4.0–10.5)
nRBC: 0 % (ref 0.0–0.2)

## 2019-11-04 MED ORDER — EPOETIN ALFA-EPBX 10000 UNIT/ML IJ SOLN
INTRAMUSCULAR | Status: AC
  Filled 2019-11-04: qty 2

## 2019-11-04 MED ORDER — EPOETIN ALFA-EPBX 10000 UNIT/ML IJ SOLN
20000.0000 [IU] | Freq: Once | INTRAMUSCULAR | Status: AC
  Administered 2019-11-04: 20000 [IU] via SUBCUTANEOUS

## 2019-11-04 NOTE — Patient Instructions (Signed)

## 2019-11-10 ENCOUNTER — Other Ambulatory Visit: Payer: Self-pay | Admitting: Cardiology

## 2019-11-18 ENCOUNTER — Inpatient Hospital Stay: Payer: Medicare Other

## 2019-11-18 ENCOUNTER — Other Ambulatory Visit: Payer: Self-pay

## 2019-11-18 ENCOUNTER — Inpatient Hospital Stay (HOSPITAL_BASED_OUTPATIENT_CLINIC_OR_DEPARTMENT_OTHER): Payer: Medicare Other | Admitting: Medical

## 2019-11-18 VITALS — BP 158/82 | HR 82 | Resp 24

## 2019-11-18 VITALS — BP 150/72 | HR 72 | Temp 97.8°F | Resp 18

## 2019-11-18 DIAGNOSIS — R0602 Shortness of breath: Secondary | ICD-10-CM | POA: Diagnosis not present

## 2019-11-18 DIAGNOSIS — D638 Anemia in other chronic diseases classified elsewhere: Secondary | ICD-10-CM

## 2019-11-18 DIAGNOSIS — U071 COVID-19: Secondary | ICD-10-CM | POA: Diagnosis not present

## 2019-11-18 LAB — CBC WITH DIFFERENTIAL (CANCER CENTER ONLY)
Abs Immature Granulocytes: 0.04 10*3/uL (ref 0.00–0.07)
Basophils Absolute: 0 10*3/uL (ref 0.0–0.1)
Basophils Relative: 0 %
Eosinophils Absolute: 0.3 10*3/uL (ref 0.0–0.5)
Eosinophils Relative: 3 %
HCT: 29.2 % — ABNORMAL LOW (ref 36.0–46.0)
Hemoglobin: 9.2 g/dL — ABNORMAL LOW (ref 12.0–15.0)
Immature Granulocytes: 0 %
Lymphocytes Relative: 16 %
Lymphs Abs: 1.5 10*3/uL (ref 0.7–4.0)
MCH: 28.1 pg (ref 26.0–34.0)
MCHC: 31.5 g/dL (ref 30.0–36.0)
MCV: 89.3 fL (ref 80.0–100.0)
Monocytes Absolute: 0.8 10*3/uL (ref 0.1–1.0)
Monocytes Relative: 9 %
Neutro Abs: 6.5 10*3/uL (ref 1.7–7.7)
Neutrophils Relative %: 72 %
Platelet Count: 313 10*3/uL (ref 150–400)
RBC: 3.27 MIL/uL — ABNORMAL LOW (ref 3.87–5.11)
RDW: 15.7 % — ABNORMAL HIGH (ref 11.5–15.5)
WBC Count: 9.2 10*3/uL (ref 4.0–10.5)
nRBC: 0 % (ref 0.0–0.2)

## 2019-11-18 MED ORDER — EPOETIN ALFA-EPBX 10000 UNIT/ML IJ SOLN
INTRAMUSCULAR | Status: AC
  Filled 2019-11-18: qty 2

## 2019-11-18 MED ORDER — EPOETIN ALFA-EPBX 10000 UNIT/ML IJ SOLN
20000.0000 [IU] | Freq: Once | INTRAMUSCULAR | Status: AC
  Administered 2019-11-18: 20000 [IU] via SUBCUTANEOUS

## 2019-11-18 NOTE — Progress Notes (Signed)
Pt ambulated w/pulse oximeter by PA Lucianne Lei.  Results in PA Van's note.

## 2019-11-18 NOTE — Patient Instructions (Signed)

## 2019-11-19 ENCOUNTER — Telehealth: Payer: Self-pay | Admitting: Cardiology

## 2019-11-19 NOTE — Progress Notes (Signed)
[facility] Progress Note   Elizabeth Olsen [id number] 26-Mar-1941 79 y.o.  [name] is managed by Dr. Truitt Merle  Actively treated with chemotherapy/immunotherapy/hormonal therapy: no  Current therapy: epoetin alfa-epbx (RETACRIT)  Last treated: 11/18/2019  Next scheduled appointment with provider: 12/31/2019  Assessment: Plan:    Anemia of chronic disease  Shortness of breath   Anemia of chronic disease: [name] continues to be followed by Dr. Burr Medico and is treated with epoetin alfa-epbx (RETACRIT) which she received today.  A CBC returned today with a hemoglobin of 9.2 and a hematocrit of 29.2  Shortness of breath: The patient's oxygen saturation levels checked and found to be 100% on room air.  She desaturated to 85% with ambulation while talking.  She then immediately recovered to 100% while she continued to ambulate and was talking episodically.  She was told to contact either her primary care physician or our office should this recur at which time she would likely be referred to pulmonology for further evaluation.  She was told to seek medical attention immediately should she have a recurrence of shortness of breath.  She expresses understanding and agreement with this plan.  She was released to go home based on her stable exam and stable oxygen saturation.  Review of her chart shows that she has a history of acute respiratory failure with hypoxia and heart failure.  Please see After Visit Summary for patient specific instructions.  Future Appointments  Date Time Provider [place]  12/02/2019  9:45 AM [facility] 5 [facility] None  12/02/2019 10:15 AM [facility] FLUSH [facility] None  12/16/2019  9:45 AM [facility] 3 [facility] None  12/16/2019 10:15 AM [facility] [place] FLUSH [facility] None  12/31/2019  9:30 AM [facility] ONLY [facility] None  12/31/2019 10:00 AM Truitt Merle, MD [facility] None  12/31/2019 10:30 AM [facility] [place] FLUSH [facility]  None    No orders of the defined types were placed in this encounter.      Subjective:   Patient ID:  Elizabeth Olsen is a 79 y.o. (DOB 1941-04-12) female.  Chief Complaint:  Chief Complaint  Patient presents with  . Shortness of Breath    HPI DEBBORAH ALONGE  Is a 79 y.o. female with a diagnosis of an anemia of chronic disease. Duffy is followed by Dr. Burr Medico and is treated with epoetin alfa-epbx (RETACRIT) which she received today.  A CBC returned today with a hemoglobin of 9.2 and a hematocrit of 29.2 this is only slightly lower than her last CBC which was collected on 11/04/2019 and returned with a hemoglobin of 9.7 and a hematocrit of 30.8.  She was seen today after her visit to the flush room when she reported having shortness of breath as she was leaving the office.  She reported that she became short of breath as she was going to her car.  She denied a cough or chest pain.  She was able to talk in full sentences.  Review of her chart shows that she has a history of acute respiratory failure with hypoxia and heart failure.  Medications: I have reviewed the patient's current medications.  Allergies:  Allergies  Allergen Reactions  . Erythromycin Shortness Of Breath and Swelling    "throat swelling", sob, thrashing ,   . Penicillins Rash    Has patient had a PCN reaction causing immediate rash, facial/tongue/throat swelling, SOB or lightheadedness with hypotension: Unknown Has patient had a PCN reaction causing severe rash  involving mucus membranes or skin necrosis: No Has patient had a PCN reaction that required hospitalization: No Has patient had a PCN reaction occurring within the last 10 years: No If all of the above answers are "NO", then may proceed with Cephalosporin use.   Marland Kitchen Nyquil [Pseudoeph-Doxylamine-Dm-Apap] Rash  . Sulfa Antibiotics Rash    Past Medical History:  Diagnosis Date  . Anemia   . Blood transfusion   . Blood transfusion without reported diagnosis     with breast surgery  . Breast cancer ([facility]) 15 years ago   left   . CHF (congestive heart failure) ([facility])   . 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 Diabetes mellitus    dx 1998.  was told prior to getting chemo that her bld sugar rose.  She thought it would go back  . Dyspnea    d/t anemia  . GERD (gastroesophageal reflux disease)   . Heart murmur    as child  . Hernia, incisional   . Hyperlipidemia   . Hypertension   . Hypothyroidism   . Vitamin D deficiency     Past Surgical History:  Procedure Laterality Date  . ABDOMINAL HYSTERECTOMY    . APPENDECTOMY    . BREAST SURGERY    . CHOLECYSTECTOMY  01/22/2018   LAPROSCOPIC   . CHOLECYSTECTOMY N/A 01/22/2018   Procedure: LAPAROSCOPIC CHOLECYSTECTOMY WITH INTRAOPERATIVE CHOLANGIOGRAM;  Surgeon: Donnie Mesa, MD;  Location: [facility];  Service: General;  Laterality: N/A;  . COLONOSCOPY  2013   due next 03-2017  . EYE SURGERY     bil cataracts  . IUD REMOVAL     with appendectomy  . MASTECTOMY, MODIFIED RADICAL W/RECONSTRUCTION Left 15 years ago   10 nodes out  . TUMOR EXCISION Left    x 2, neck, head    Family History  Problem Relation Age of Onset  . Diabetes Other        both sides of family  . Hypertension Father   . Congestive Heart Failure Father   . Peripheral vascular disease Father   . Hypertension Maternal Grandfather   . Hypertension Maternal Grandmother   . Stomach cancer Maternal Grandmother        GGM  . Heart attack Brother   . Colon cancer Neg Hx   . Esophageal cancer Neg Hx   . Pancreatic cancer Neg Hx   . Rectal cancer Neg Hx     Social History   Socioeconomic History  . Marital status: Married    Spouse name: Not on file  . Number of children: 3  . Years of education: Not on file  . Highest education level: Not on file  Occupational History  . Occupation: homemaker  Tobacco Use  . Smoking status: Never Smoker  . Smokeless tobacco: Never Used  Substance and Sexual Activity  . Alcohol use:  No  . Drug use: No  . Sexual activity: Not on file  Other Topics Concern  . Not on file  Social History Narrative  . Not on file   Social Determinants of Health   Financial Resource Strain:   . Difficulty of Paying Living Expenses: Not on file  Food Insecurity:   . Worried About Charity fundraiser in the Last Year: Not on file  . Ran Out of Food in the Last Year: Not on file  Transportation Needs:   . Lack of Transportation (Medical): Not on file  . Lack of Transportation (Non-Medical): Not on file  Physical Activity:   . Days of Exercise per Week: Not on file  . Minutes of Exercise per Session: Not on file  Stress:   . Feeling of Stress : Not on file  Social Connections:   . Frequency of Communication with Friends and Family: Not on file  . Frequency of Social Gatherings with Friends and Family: Not on file  . Attends Religious Services: Not on file  . Active Member of Clubs or Organizations: Not on file  . Attends Archivist Meetings: Not on file  . Marital Status: Not on file  Intimate Partner Violence:   . Fear of Current or Ex-Partner: Not on file  . Emotionally Abused: Not on file  . Physically Abused: Not on file  . Sexually Abused: Not on file    Past Medical History, Surgical history, Social history, and Family history were reviewed and updated as appropriate.   Please see review of systems for further details on the patient's review from today.   Review of Systems:  Review of Systems  Constitutional: Negative for chills, diaphoresis and fever.  HENT: Negative for trouble swallowing.   Respiratory: Positive for shortness of breath. Negative for cough, choking, chest tightness, wheezing and stridor.   Cardiovascular: Negative for chest pain and palpitations.  Neurological: Negative for dizziness and weakness.    Objective:   Physical Exam:  BP (!) 158/82 Comment: manual, PA Lucianne Lei aware  Pulse 82   Resp (!) 24   SpO2 100% Comment: room  air ECOG: 0  Oxygen Saturation:  Room air at rest:    100 % Room air with ambulation:   85 % recovering to 100 % without intervention despite continuing to ambuate   Physical Exam Constitutional:      General: She is not in acute distress.    Appearance: She is not diaphoretic.  HENT:     Head: Normocephalic and atraumatic.     Right Ear: External ear normal.     Left Ear: External ear normal.     Mouth/Throat:     Pharynx: No oropharyngeal exudate.  Cardiovascular:     Rate and Rhythm: Normal rate and regular rhythm.     Heart sounds: Normal heart sounds. No murmur. No friction rub. No gallop.   Pulmonary:     Effort: Pulmonary effort is normal. No tachypnea, accessory muscle usage or respiratory distress.     Breath sounds: Normal breath sounds. No wheezing, rhonchi or rales.  Skin:    General: Skin is warm and dry.     Findings: No erythema or rash.  Neurological:     Mental Status: She is alert.     Motor: No weakness.     Coordination: Coordination normal.  Psychiatric:        Mood and Affect: Mood normal. Mood is not anxious.        Behavior: Behavior normal. Behavior is not agitated.        Thought Content: Thought content normal.        Judgment: Judgment normal.     Lab Review:     Component Value Date/Time   NA 138 09/10/2019 1122   NA 137 08/18/2019 1035   K 5.1 09/10/2019 1122   CL 99 09/10/2019 1122   CO2 32 09/10/2019 1122   GLUCOSE 222 (H) 09/10/2019 1122   BUN 41 (H) 09/10/2019 1122   BUN 37 (H) 08/18/2019 1035   CREATININE 1.79 (H) 09/10/2019 1122   CALCIUM 8.6 (L) 09/10/2019 1122  PROT 6.5 09/10/2019 1122   ALBUMIN 3.2 (L) 09/10/2019 1122   AST 27 09/10/2019 1122   ALT 12 09/10/2019 1122   ALKPHOS 49 09/10/2019 1122   BILITOT 0.5 09/10/2019 1122   GFRNONAA 27 (L) 09/10/2019 1122   GFRAA 31 (L) 09/10/2019 1122       Component Value Date/Time   WBC 9.2 11/18/2019 1002   WBC 6.4 09/04/2018 1351   RBC 3.27 (L) 11/18/2019 1002   HGB  9.2 (L) 11/18/2019 1002   HGB 9.7 (L) 07/23/2017 1538   HGB 12.3 02/14/2007 0909   HCT 29.2 (L) 11/18/2019 1002   HCT 28.2 (L) 11/07/2017 1608   HCT 35.6 02/14/2007 0909   PLT 313 11/18/2019 1002   PLT 436 (H) 07/23/2017 1538   MCV 89.3 11/18/2019 1002   MCV 89 07/23/2017 1538   MCV 87.5 02/14/2007 0909   MCH 28.1 11/18/2019 1002   MCHC 31.5 11/18/2019 1002   RDW 15.7 (H) 11/18/2019 1002   RDW 13.8 07/23/2017 1538   RDW 13.1 02/14/2007 0909   LYMPHSABS 1.5 11/18/2019 1002   LYMPHSABS 2.4 02/14/2007 0909   MONOABS 0.8 11/18/2019 1002   MONOABS 0.7 02/14/2007 0909   EOSABS 0.3 11/18/2019 1002   EOSABS 0.2 02/14/2007 0909   BASOSABS 0.0 11/18/2019 1002   BASOSABS 0.0 02/14/2007 0909   -------------------------------  Imaging from last 24 hours (if applicable):  Radiology interpretation: No results found.

## 2019-11-19 NOTE — Telephone Encounter (Signed)
Called patient back about her message. Patient stated she is really SOB. Patient stated she is SOB with just talking on the phone and hardly doing anything. Patient stated she would get SOB with activity at times, but this is worse. Encouraged patient to go to the ED. Patient refused to go to ED, but stated she would go to urgent care. Will forward to Dr. Agustin Cree and his nurse for further advisement.

## 2019-11-19 NOTE — Telephone Encounter (Signed)
New Message  Pt c/o Shortness Of Breath: STAT if SOB developed within the last 24 hours or pt is noticeably SOB on the phone  1. Are you currently SOB (can you hear that pt is SOB on the phone)? Yes  2. How long have you been experiencing SOB? A  week  3. Are you SOB when sitting or when up moving around? Both, but is worse when moving around.  4. Are you currently experiencing any other symptoms? SOB, oxygen level low, cough, bp elevated when patient is experiencing SOB,

## 2019-11-20 ENCOUNTER — Other Ambulatory Visit: Payer: Self-pay

## 2019-11-20 ENCOUNTER — Inpatient Hospital Stay (HOSPITAL_COMMUNITY): Payer: Medicare Other

## 2019-11-20 ENCOUNTER — Emergency Department (HOSPITAL_COMMUNITY): Payer: Medicare Other

## 2019-11-20 ENCOUNTER — Encounter (HOSPITAL_COMMUNITY): Payer: Self-pay | Admitting: Student

## 2019-11-20 ENCOUNTER — Inpatient Hospital Stay (HOSPITAL_COMMUNITY)
Admission: EM | Admit: 2019-11-20 | Discharge: 2019-11-27 | DRG: 177 | Disposition: A | Discharge status: Home / Self Care | Payer: Medicare Other | Attending: Internal Medicine | Admitting: Internal Medicine

## 2019-11-20 ENCOUNTER — Other Ambulatory Visit (HOSPITAL_COMMUNITY): Payer: Self-pay

## 2019-11-20 DIAGNOSIS — I340 Nonrheumatic mitral (valve) insufficiency: Secondary | ICD-10-CM | POA: Diagnosis not present

## 2019-11-20 DIAGNOSIS — N189 Chronic kidney disease, unspecified: Secondary | ICD-10-CM | POA: Diagnosis present

## 2019-11-20 DIAGNOSIS — R195 Other fecal abnormalities: Secondary | ICD-10-CM | POA: Diagnosis not present

## 2019-11-20 DIAGNOSIS — I5033 Acute on chronic diastolic (congestive) heart failure: Secondary | ICD-10-CM | POA: Diagnosis present

## 2019-11-20 DIAGNOSIS — Z79890 Hormone replacement therapy (postmenopausal): Secondary | ICD-10-CM

## 2019-11-20 DIAGNOSIS — I509 Heart failure, unspecified: Secondary | ICD-10-CM

## 2019-11-20 DIAGNOSIS — R0600 Dyspnea, unspecified: Secondary | ICD-10-CM | POA: Diagnosis present

## 2019-11-20 DIAGNOSIS — Z888 Allergy status to other drugs, medicaments and biological substances status: Secondary | ICD-10-CM

## 2019-11-20 DIAGNOSIS — Z853 Personal history of malignant neoplasm of breast: Secondary | ICD-10-CM | POA: Diagnosis not present

## 2019-11-20 DIAGNOSIS — U071 COVID-19: Secondary | ICD-10-CM

## 2019-11-20 DIAGNOSIS — K219 Gastro-esophageal reflux disease without esophagitis: Secondary | ICD-10-CM | POA: Diagnosis present

## 2019-11-20 DIAGNOSIS — E1165 Type 2 diabetes mellitus with hyperglycemia: Secondary | ICD-10-CM | POA: Diagnosis present

## 2019-11-20 DIAGNOSIS — Z881 Allergy status to other antibiotic agents status: Secondary | ICD-10-CM | POA: Diagnosis not present

## 2019-11-20 DIAGNOSIS — N179 Acute kidney failure, unspecified: Secondary | ICD-10-CM

## 2019-11-20 DIAGNOSIS — E1122 Type 2 diabetes mellitus with diabetic chronic kidney disease: Secondary | ICD-10-CM | POA: Diagnosis present

## 2019-11-20 DIAGNOSIS — Z800 Family history of malignant neoplasm of digestive organs: Secondary | ICD-10-CM

## 2019-11-20 DIAGNOSIS — N170 Acute kidney failure with tubular necrosis: Secondary | ICD-10-CM | POA: Diagnosis present

## 2019-11-20 DIAGNOSIS — I351 Nonrheumatic aortic (valve) insufficiency: Secondary | ICD-10-CM | POA: Diagnosis not present

## 2019-11-20 DIAGNOSIS — E871 Hypo-osmolality and hyponatremia: Secondary | ICD-10-CM | POA: Diagnosis present

## 2019-11-20 DIAGNOSIS — E1139 Type 2 diabetes mellitus with other diabetic ophthalmic complication: Secondary | ICD-10-CM

## 2019-11-20 DIAGNOSIS — E785 Hyperlipidemia, unspecified: Secondary | ICD-10-CM | POA: Diagnosis present

## 2019-11-20 DIAGNOSIS — I5031 Acute diastolic (congestive) heart failure: Secondary | ICD-10-CM

## 2019-11-20 DIAGNOSIS — I130 Hypertensive heart and chronic kidney disease with heart failure and stage 1 through stage 4 chronic kidney disease, or unspecified chronic kidney disease: Secondary | ICD-10-CM | POA: Diagnosis present

## 2019-11-20 DIAGNOSIS — Z880 Allergy status to penicillin: Secondary | ICD-10-CM | POA: Diagnosis not present

## 2019-11-20 DIAGNOSIS — I10 Essential (primary) hypertension: Secondary | ICD-10-CM | POA: Diagnosis not present

## 2019-11-20 DIAGNOSIS — R0603 Acute respiratory distress: Secondary | ICD-10-CM

## 2019-11-20 DIAGNOSIS — S80812A Abrasion, left lower leg, initial encounter: Secondary | ICD-10-CM | POA: Diagnosis present

## 2019-11-20 DIAGNOSIS — E860 Dehydration: Secondary | ICD-10-CM | POA: Diagnosis present

## 2019-11-20 DIAGNOSIS — R0602 Shortness of breath: Secondary | ICD-10-CM | POA: Diagnosis present

## 2019-11-20 DIAGNOSIS — Z882 Allergy status to sulfonamides status: Secondary | ICD-10-CM | POA: Diagnosis not present

## 2019-11-20 DIAGNOSIS — Z8249 Family history of ischemic heart disease and other diseases of the circulatory system: Secondary | ICD-10-CM

## 2019-11-20 DIAGNOSIS — Z794 Long term (current) use of insulin: Secondary | ICD-10-CM

## 2019-11-20 DIAGNOSIS — E109 Type 1 diabetes mellitus without complications: Secondary | ICD-10-CM

## 2019-11-20 DIAGNOSIS — Z0189 Encounter for other specified special examinations: Secondary | ICD-10-CM

## 2019-11-20 DIAGNOSIS — Z23 Encounter for immunization: Secondary | ICD-10-CM

## 2019-11-20 DIAGNOSIS — R7989 Other specified abnormal findings of blood chemistry: Secondary | ICD-10-CM | POA: Diagnosis not present

## 2019-11-20 DIAGNOSIS — D649 Anemia, unspecified: Secondary | ICD-10-CM | POA: Diagnosis present

## 2019-11-20 DIAGNOSIS — R739 Hyperglycemia, unspecified: Secondary | ICD-10-CM

## 2019-11-20 DIAGNOSIS — E039 Hypothyroidism, unspecified: Secondary | ICD-10-CM | POA: Diagnosis present

## 2019-11-20 DIAGNOSIS — Z01818 Encounter for other preprocedural examination: Secondary | ICD-10-CM

## 2019-11-20 DIAGNOSIS — E119 Type 2 diabetes mellitus without complications: Secondary | ICD-10-CM

## 2019-11-20 HISTORY — DX: Acute diastolic (congestive) heart failure: I50.31

## 2019-11-20 HISTORY — DX: COVID-19: U07.1

## 2019-11-20 LAB — CBC
HCT: 35 % — ABNORMAL LOW (ref 36.0–46.0)
Hemoglobin: 10.6 g/dL — ABNORMAL LOW (ref 12.0–15.0)
MCH: 28.3 pg (ref 26.0–34.0)
MCHC: 30.3 g/dL (ref 30.0–36.0)
MCV: 93.6 fL (ref 80.0–100.0)
Platelets: 321 10*3/uL (ref 150–400)
RBC: 3.74 MIL/uL — ABNORMAL LOW (ref 3.87–5.11)
RDW: 15.5 % (ref 11.5–15.5)
WBC: 7.8 10*3/uL (ref 4.0–10.5)
nRBC: 0 % (ref 0.0–0.2)

## 2019-11-20 LAB — CBC WITH DIFFERENTIAL/PLATELET
Abs Immature Granulocytes: 0.09 10*3/uL — ABNORMAL HIGH (ref 0.00–0.07)
Basophils Absolute: 0 10*3/uL (ref 0.0–0.1)
Basophils Relative: 1 %
Eosinophils Absolute: 0.1 10*3/uL (ref 0.0–0.5)
Eosinophils Relative: 1 %
HCT: 31.2 % — ABNORMAL LOW (ref 36.0–46.0)
Hemoglobin: 9.8 g/dL — ABNORMAL LOW (ref 12.0–15.0)
Immature Granulocytes: 1 %
Lymphocytes Relative: 6 %
Lymphs Abs: 0.5 10*3/uL — ABNORMAL LOW (ref 0.7–4.0)
MCH: 28.6 pg (ref 26.0–34.0)
MCHC: 31.4 g/dL (ref 30.0–36.0)
MCV: 91 fL (ref 80.0–100.0)
Monocytes Absolute: 0.9 10*3/uL (ref 0.1–1.0)
Monocytes Relative: 11 %
Neutro Abs: 6.6 10*3/uL (ref 1.7–7.7)
Neutrophils Relative %: 80 %
Platelets: 296 10*3/uL (ref 150–400)
RBC: 3.43 MIL/uL — ABNORMAL LOW (ref 3.87–5.11)
RDW: 15.3 % (ref 11.5–15.5)
WBC: 8.3 10*3/uL (ref 4.0–10.5)
nRBC: 0 % (ref 0.0–0.2)

## 2019-11-20 LAB — COMPREHENSIVE METABOLIC PANEL
ALT: 24 U/L (ref 0–44)
ALT: 23 U/L (ref 0–44)
AST: 44 U/L — ABNORMAL HIGH (ref 15–41)
AST: 42 U/L — ABNORMAL HIGH (ref 15–41)
Albumin: 3.2 g/dL — ABNORMAL LOW (ref 3.5–5.0)
Albumin: 3.4 g/dL — ABNORMAL LOW (ref 3.5–5.0)
Alkaline Phosphatase: 68 U/L (ref 38–126)
Alkaline Phosphatase: 65 U/L (ref 38–126)
Anion gap: 11 (ref 5–15)
Anion gap: 12 (ref 5–15)
BUN: 36 mg/dL — ABNORMAL HIGH (ref 8–23)
BUN: 35 mg/dL — ABNORMAL HIGH (ref 8–23)
CO2: 25 mmol/L (ref 22–32)
CO2: 23 mmol/L (ref 22–32)
Calcium: 8.8 mg/dL — ABNORMAL LOW (ref 8.9–10.3)
Calcium: 9 mg/dL (ref 8.9–10.3)
Chloride: 96 mmol/L — ABNORMAL LOW (ref 98–111)
Chloride: 101 mmol/L (ref 98–111)
Creatinine, Ser: 1.73 mg/dL — ABNORMAL HIGH (ref 0.44–1.00)
Creatinine, Ser: 1.66 mg/dL — ABNORMAL HIGH (ref 0.44–1.00)
GFR calc Af Amer: 32 mL/min — ABNORMAL LOW (ref >60)
GFR calc Af Amer: 34 mL/min — ABNORMAL LOW (ref >60)
GFR calc non Af Amer: 28 mL/min — ABNORMAL LOW (ref >60)
GFR calc non Af Amer: 29 mL/min — ABNORMAL LOW (ref >60)
Glucose, Bld: 515 mg/dL (ref 70–99)
Glucose, Bld: 235 mg/dL — ABNORMAL HIGH (ref 70–99)
Potassium: 4.2 mmol/L (ref 3.5–5.1)
Potassium: 4.2 mmol/L (ref 3.5–5.1)
Sodium: 132 mmol/L — ABNORMAL LOW (ref 135–145)
Sodium: 136 mmol/L (ref 135–145)
Total Bilirubin: 1.1 mg/dL (ref 0.3–1.2)
Total Bilirubin: 0.8 mg/dL (ref 0.3–1.2)
Total Protein: 6.8 g/dL (ref 6.5–8.1)
Total Protein: 7.1 g/dL (ref 6.5–8.1)

## 2019-11-20 LAB — CBG MONITORING, ED
Glucose-Capillary: 112 mg/dL — ABNORMAL HIGH (ref 70–99)
Glucose-Capillary: 255 mg/dL — ABNORMAL HIGH (ref 70–99)
Glucose-Capillary: 257 mg/dL — ABNORMAL HIGH (ref 70–99)
Glucose-Capillary: 294 mg/dL — ABNORMAL HIGH (ref 70–99)
Glucose-Capillary: 360 mg/dL — ABNORMAL HIGH (ref 70–99)
Glucose-Capillary: 365 mg/dL — ABNORMAL HIGH (ref 70–99)
Glucose-Capillary: 375 mg/dL — ABNORMAL HIGH (ref 70–99)
Glucose-Capillary: 502 mg/dL (ref 70–99)
Glucose-Capillary: 64 mg/dL — ABNORMAL LOW (ref 70–99)

## 2019-11-20 LAB — HEMOGLOBIN A1C
Hgb A1c MFr Bld: 9 % — ABNORMAL HIGH (ref 4.8–5.6)
Mean Plasma Glucose: 211.6 mg/dL

## 2019-11-20 LAB — CK TOTAL AND CKMB (NOT AT ARMC)
CK, MB: 6.1 ng/mL — ABNORMAL HIGH (ref 0.5–5.0)
Relative Index: 1.8 (ref 0.0–2.5)
Total CK: 345 U/L — ABNORMAL HIGH (ref 38–234)

## 2019-11-20 LAB — URINALYSIS, ROUTINE W REFLEX MICROSCOPIC
Bacteria, UA: NONE SEEN
Bilirubin Urine: NEGATIVE
Glucose, UA: >=500 mg/dL — AB
Hgb urine dipstick: NEGATIVE
Ketones, ur: NEGATIVE mg/dL
Leukocytes,Ua: NEGATIVE
Nitrite: NEGATIVE
Protein, ur: >=300 mg/dL — AB
Specific Gravity, Urine: 1.012 (ref 1.005–1.030)
pH: 6 (ref 5.0–8.0)

## 2019-11-20 LAB — HEPATITIS PANEL, ACUTE
HCV Ab: NONREACTIVE
Hep A IgM: NONREACTIVE
Hep B C IgM: NONREACTIVE
Hepatitis B Surface Ag: NONREACTIVE

## 2019-11-20 LAB — ECHOCARDIOGRAM COMPLETE
Height: 63 in
Weight: 2480 oz

## 2019-11-20 LAB — C-REACTIVE PROTEIN
CRP: 0.6 mg/dL (ref <1.0)
CRP: <0.5 mg/dL (ref <1.0)

## 2019-11-20 LAB — RESPIRATORY PANEL BY RT PCR (FLU A&B, COVID)
Influenza A by PCR: NEGATIVE
Influenza B by PCR: NEGATIVE
SARS Coronavirus 2 by RT PCR: POSITIVE — AB

## 2019-11-20 LAB — TROPONIN I (HIGH SENSITIVITY)
Troponin I (High Sensitivity): 21 ng/L — ABNORMAL HIGH (ref <18)
Troponin I (High Sensitivity): 23 ng/L — ABNORMAL HIGH (ref <18)

## 2019-11-20 LAB — D-DIMER, QUANTITATIVE: D-Dimer, Quant: 1.74 ug/mL-FEU — ABNORMAL HIGH (ref 0.00–0.50)

## 2019-11-20 LAB — BRAIN NATRIURETIC PEPTIDE: B Natriuretic Peptide: 484.5 pg/mL — ABNORMAL HIGH (ref 0.0–100.0)

## 2019-11-20 LAB — POC SARS CORONAVIRUS 2 AG -  ED: SARS Coronavirus 2 Ag: NEGATIVE

## 2019-11-20 LAB — LACTIC ACID, PLASMA: Lactic Acid, Venous: 2.2 mmol/L (ref 0.5–1.9)

## 2019-11-20 LAB — FIBRINOGEN: Fibrinogen: 722 mg/dL — ABNORMAL HIGH (ref 210–475)

## 2019-11-20 MED ORDER — INSULIN ASPART 100 UNIT/ML ~~LOC~~ SOLN
0.0000 [IU] | Freq: Every day | SUBCUTANEOUS | Status: DC
  Administered 2019-11-20: 3 [IU] via SUBCUTANEOUS
  Administered 2019-11-21: 22:00:00 2 [IU] via SUBCUTANEOUS
  Administered 2019-11-22: 23:00:00 3 [IU] via SUBCUTANEOUS
  Administered 2019-11-23: 22:00:00 2 [IU] via SUBCUTANEOUS
  Administered 2019-11-24: 22:00:00 3 [IU] via SUBCUTANEOUS

## 2019-11-20 MED ORDER — INSULIN GLARGINE 100 UNIT/ML ~~LOC~~ SOLN
4.0000 [IU] | Freq: Every day | SUBCUTANEOUS | Status: DC
  Administered 2019-11-20 – 2019-11-25 (×6): 4 [IU] via SUBCUTANEOUS
  Filled 2019-11-20 (×8): qty 0.04

## 2019-11-20 MED ORDER — INSULIN GLARGINE 100 UNIT/ML SOLOSTAR PEN: 4.0000 [IU] | PEN_INJECTOR | SUBCUTANEOUS | Status: DC

## 2019-11-20 MED ORDER — SODIUM CHLORIDE 0.9 % IV SOLN: 250.0000 mL | INTRAVENOUS | Status: DC | PRN

## 2019-11-20 MED ORDER — SODIUM CHLORIDE 0.9 % IV SOLN
100.0000 mg | Freq: Every day | INTRAVENOUS | Status: AC
  Administered 2019-11-21 – 2019-11-24 (×4): 100 mg via INTRAVENOUS
  Filled 2019-11-20 (×4): qty 20

## 2019-11-20 MED ORDER — HYDRALAZINE HCL 20 MG/ML IJ SOLN
10.0000 mg | Freq: Four times a day (QID) | INTRAMUSCULAR | Status: DC | PRN
  Administered 2019-11-22 (×2): 10 mg via INTRAVENOUS
  Filled 2019-11-20 (×2): qty 1

## 2019-11-20 MED ORDER — AMLODIPINE BESYLATE 5 MG PO TABS
5.0000 mg | ORAL_TABLET | Freq: Every day | ORAL | Status: DC
  Administered 2019-11-20 – 2019-11-24 (×5): 5 mg via ORAL
  Filled 2019-11-20 (×5): qty 1

## 2019-11-20 MED ORDER — POLYSACCHARIDE IRON COMPLEX 150 MG PO CAPS
150.0000 mg | ORAL_CAPSULE | Freq: Two times a day (BID) | ORAL | Status: DC
  Administered 2019-11-20 – 2019-11-27 (×15): 150 mg via ORAL
  Filled 2019-11-20 (×17): qty 1

## 2019-11-20 MED ORDER — BACITRACIN ZINC 500 UNIT/GM EX OINT
TOPICAL_OINTMENT | Freq: Two times a day (BID) | CUTANEOUS | Status: DC
  Administered 2019-11-20 – 2019-11-26 (×4): 1 via TOPICAL
  Filled 2019-11-20 (×2): qty 0.9
  Filled 2019-11-20: qty 28.4
  Filled 2019-11-20: qty 0.9

## 2019-11-20 MED ORDER — SODIUM CHLORIDE 0.9% FLUSH
3.0000 mL | Freq: Two times a day (BID) | INTRAVENOUS | Status: DC
  Administered 2019-11-21 – 2019-11-24 (×7): 3 mL via INTRAVENOUS

## 2019-11-20 MED ORDER — INSULIN ASPART 100 UNIT/ML ~~LOC~~ SOLN
0.0000 [IU] | Freq: Three times a day (TID) | SUBCUTANEOUS | Status: DC
  Administered 2019-11-20 (×2): 9 [IU] via SUBCUTANEOUS
  Administered 2019-11-21: 13:00:00 7 [IU] via SUBCUTANEOUS
  Administered 2019-11-21: 09:00:00 9 [IU] via SUBCUTANEOUS
  Administered 2019-11-21: 19:00:00 5 [IU] via SUBCUTANEOUS
  Administered 2019-11-22: 08:00:00 7 [IU] via SUBCUTANEOUS
  Administered 2019-11-22: 12:00:00 9 [IU] via SUBCUTANEOUS
  Administered 2019-11-22: 18:00:00 5 [IU] via SUBCUTANEOUS
  Administered 2019-11-23: 09:00:00 7 [IU] via SUBCUTANEOUS
  Administered 2019-11-23: 9 [IU] via SUBCUTANEOUS

## 2019-11-20 MED ORDER — LEVOTHYROXINE SODIUM 112 MCG PO TABS: 112.0000 ug | ORAL_TABLET | ORAL | Status: DC

## 2019-11-20 MED ORDER — ATORVASTATIN CALCIUM 10 MG PO TABS
10.0000 mg | ORAL_TABLET | Freq: Every day | ORAL | Status: DC
  Administered 2019-11-20 – 2019-11-27 (×8): 10 mg via ORAL
  Filled 2019-11-20 (×12): qty 1

## 2019-11-20 MED ORDER — LOSARTAN POTASSIUM 50 MG PO TABS
25.0000 mg | ORAL_TABLET | Freq: Every day | ORAL | Status: DC
  Administered 2019-11-20 – 2019-11-22 (×3): 25 mg via ORAL
  Filled 2019-11-20 (×3): qty 1

## 2019-11-20 MED ORDER — FUROSEMIDE 10 MG/ML IJ SOLN
40.0000 mg | Freq: Once | INTRAMUSCULAR | Status: AC
  Administered 2019-11-20: 40 mg via INTRAVENOUS
  Filled 2019-11-20: qty 4

## 2019-11-20 MED ORDER — SODIUM CHLORIDE 0.9% FLUSH: 3.0000 mL | INTRAVENOUS | Status: DC | PRN

## 2019-11-20 MED ORDER — POTASSIUM CHLORIDE CRYS ER 20 MEQ PO TBCR
40.0000 meq | EXTENDED_RELEASE_TABLET | Freq: Once | ORAL | Status: AC
  Administered 2019-11-20: 40 meq via ORAL
  Filled 2019-11-20: qty 2

## 2019-11-20 MED ORDER — FENOFIBRATE 160 MG PO TABS
160.0000 mg | ORAL_TABLET | Freq: Every day | ORAL | Status: DC
  Administered 2019-11-20 – 2019-11-26 (×7): 160 mg via ORAL
  Filled 2019-11-20 (×7): qty 1

## 2019-11-20 MED ORDER — CARVEDILOL 6.25 MG PO TABS
6.2500 mg | ORAL_TABLET | Freq: Two times a day (BID) | ORAL | Status: DC
  Administered 2019-11-20 – 2019-11-27 (×15): 6.25 mg via ORAL
  Filled 2019-11-20 (×15): qty 1

## 2019-11-20 MED ORDER — TETANUS-DIPHTH-ACELL PERTUSSIS 5-2.5-18.5 LF-MCG/0.5 IM SUSP
0.5000 mL | Freq: Once | INTRAMUSCULAR | Status: AC
  Administered 2019-11-20: 0.5 mL via INTRAMUSCULAR
  Filled 2019-11-20: qty 0.5

## 2019-11-20 MED ORDER — LEVOTHYROXINE SODIUM 112 MCG PO TABS
112.0000 ug | ORAL_TABLET | ORAL | Status: DC
  Administered 2019-11-21 – 2019-11-27 (×5): 112 ug via ORAL
  Filled 2019-11-20 (×6): qty 1

## 2019-11-20 MED ORDER — ENOXAPARIN SODIUM 40 MG/0.4ML ~~LOC~~ SOLN
40.0000 mg | SUBCUTANEOUS | Status: DC
  Administered 2019-11-20 – 2019-11-22 (×3): 40 mg via SUBCUTANEOUS
  Filled 2019-11-20 (×4): qty 0.4

## 2019-11-20 MED ORDER — DEXAMETHASONE SODIUM PHOSPHATE 10 MG/ML IJ SOLN
6.0000 mg | Freq: Every day | INTRAMUSCULAR | Status: DC
  Administered 2019-11-20 – 2019-11-24 (×5): 6 mg via INTRAVENOUS
  Filled 2019-11-20 (×5): qty 1

## 2019-11-20 MED ORDER — INSULIN GLARGINE 100 UNIT/ML ~~LOC~~ SOLN
6.0000 [IU] | Freq: Every morning | SUBCUTANEOUS | Status: DC
  Administered 2019-11-20 – 2019-11-24 (×5): 6 [IU] via SUBCUTANEOUS
  Filled 2019-11-20 (×5): qty 0.06

## 2019-11-20 MED ORDER — SODIUM CHLORIDE 0.9 % IV SOLN
200.0000 mg | Freq: Once | INTRAVENOUS | Status: AC
  Administered 2019-11-20: 200 mg via INTRAVENOUS
  Filled 2019-11-20: qty 200

## 2019-11-20 MED ORDER — FUROSEMIDE 10 MG/ML IJ SOLN
40.0000 mg | Freq: Every day | INTRAMUSCULAR | Status: DC
  Administered 2019-11-20 – 2019-11-23 (×4): 40 mg via INTRAVENOUS
  Filled 2019-11-20 (×4): qty 4

## 2019-11-20 MED ORDER — INSULIN REGULAR(HUMAN) IN NACL 100-0.9 UT/100ML-% IV SOLN
INTRAVENOUS | Status: DC
  Administered 2019-11-20: 9 [IU]/h via INTRAVENOUS
  Filled 2019-11-20: qty 100

## 2019-11-20 NOTE — Progress Notes (Signed)
Inpatient Diabetes Program Recommendations  AACE/ADA: New Consensus Statement on Inpatient Glycemic Control (2015)  Target Ranges:  Prepandial:   less than 140 mg/dL      Peak postprandial:   less than 180 mg/dL (1-2 hours)      Critically ill patients:  140 - 180 mg/dL   Lab Results  Component Value Date   GLUCAP 64 (L) 11/20/2019   HGBA1C 9.5 (H) 01/18/2018    Review of Glycemic Control  Results for Elizabeth Olsen, Elizabeth Olsen (MRN [id number]) as of 11/20/2019 10:18  Ref. Range 11/20/2019 00:37 11/20/2019 03:01 11/20/2019 04:42 11/20/2019 06:56 11/20/2019 07:46  Glucose-Capillary Latest Ref Range: 70 - 99 mg/dL 502 (HH) 360 (H) 255 (H) 112 (H) 64 (L)    Diabetes history: TYPE 1 DM Outpatient Diabetes medications: Lantus 6 units QAM/4 units QPM +                                                              Humalog 3 units TID with meals + SSI Current orders for Inpatient glycemic control:      Lantus 6 units QAM/4 units QPM + Novolog 0-9 TID with meals + 0-5       QHS + Decadron 6 mg QD  Note: Spoke with patient via cell phone this morning.  Verified above home medications.   She was placed on IV insulin overnight and transitioned off.  She became hypoglycemic this morning 64 mg/dl.   Patient recently changed endocrinologists.  Now sees Jacolyn Reedy at [facility].  Last visit was 10/01/19.  Patient was notified then that she actually has T1DM.  C-Peptide was 0.05.  She denies difficulties obtaining medications and states states she takes her insulin as prescribed.  Reviewed patient's current A1c of 9.4% on 10/01/19 which was down from 14%. Congratulated patient on this decrease.  Explained what a A1c is and what it measures. Also reviewed goal A1c with patient, importance of good glucose control @ home, and blood sugar goals.  Reviewed hypoglycemia and treatment.  Also explained since she is on steroids that we expect her BS to be elevated and we will follow her while inpatient.  SQ insulins starting this  am.  Will likely need meal coverage.   Thank you, Reche Dixon, RN, BSN Diabetes Coordinator Inpatient Diabetes Program [phone] (team pager from 8a-5p)

## 2019-11-20 NOTE — Progress Notes (Signed)
Patient admitted after midnight, please see H&P from Dr. Maudie Mercury.  Patient presented to the ER via EMS with a 4-day history of shortness of breath progressively worsening with lower extremity edema.  Shortness of breath secondary to COVID-19 infection plus possible CHF exacerbation -Elevated D-dimer: Could be an acute phase reactant versus a clot will get lower extremity duplexes, if unrevealing we will then proceed to a VQ scan, not able to have CTA due to kidney function -Dexamethasone -Remdesivir -Daily labs  Acute on chronic diastolic dysfunction Recent echo shows grade 2 just Aulik function apparently echo was repeated here as well even though it was done 1 month ago -IV Lasix Continue beta-blocker as well as losartan with close monitoring of kidney function  Hyperglycemia with sugars greater than 500 -Was started on insulin drip, will transition to subcu insulin and sliding scale  Hyponatremia -Probably due to volume overload as has improved with IV diuresis  Eulogio Bear DO

## 2019-11-20 NOTE — Progress Notes (Signed)
Bilateral lower extremity venous duplex is performed.   Preliminary results can be found under CV proc under chart review.  11/20/2019 12:38 PM  Raysha Tilmon, K., RDMS, RVT

## 2019-11-20 NOTE — ED Notes (Signed)
+  tele  Breakfast ordered 

## 2019-11-20 NOTE — Progress Notes (Signed)
  Echocardiogram 2D Echocardiogram has been performed.  Elizabeth Olsen A Marshal Eskew 11/20/2019, 8:44 AM

## 2019-11-20 NOTE — H&P (Addendum)
TRH H&P    Patient Demographics:    Elizabeth Olsen, is a 79 y.o. female  MRN: [id number]  DOB - [birth year]  Admit Date - 11/20/2019  Referring MD/NP/PA:  Kennith Maes  Outpatient Primary MD for the patient is [name], MD  Patient coming from:  Home   Chief complaint-  Dyspnea x 4 days,  Lower ext edema.    HPI:    Elizabeth Olsen  is a 80 y.o. female,  w Jerrye Bushy, vitamin D deficiency, h/o remote breast cancer,  hypothyroidism, hypertension, hyperlipidemia, Dm2, CHF (diastolic), presents with dyspnea x 4 days. Slight cough, chronic, (pt attributes to her bp medication), slight weigh gain 10 lbs over the past few months.  Slight leg swelling right > left.   Pt denies fever, chills, cp, palp, n/v, diarrhea, brbpr.     In ED,  T 98.3, P 93 R 17, Bp 186/78  Pox 96% on RA  CXR IMPRESSION: 1. Cardiomegaly with findings suggestive of congestive heart failure. An atypical infectious process can have a similar appearance. 2. Small nodular densities in the right upper lobe. Attention on follow-up examinations is recommended.  Na 132, K 4.2, Bun 36, Creatinine 1.73 ASt 44, Alt 24 Alb 3.2 Wbc 8.3, Hgb 9.8, Plt 296 BNP 484 Trop 21  ekg nsr at 70, nl axis, nl int, no st-t changes c/w ischemia  Pt will be admitted for dyspnea secondary to covid -19 and CHF     Review of systems:    In addition to the HPI above,  No Fever-chills, No Headache, No changes with Vision or hearing, No problems swallowing food or Liquids, No Chest pain,  No Abdominal pain, No Nausea or Vomiting, bowel movements are regular, No Blood in stool or Urine, No dysuria, No new skin rashes or bruises, No new joints pains-aches,  No new weakness, tingling, numbness in any extremity, No recent weight gain or loss, No polyuria, polydypsia or polyphagia, No significant Mental Stressors.  All other systems reviewed and are  negative.    Past History of the following :    Past Medical History:  Diagnosis Date  . Anemia   . Blood transfusion   . Blood transfusion without reported diagnosis    with breast surgery  . Breast cancer ([facility]) 15 years ago   left   . CHF (congestive heart failure) ([facility])   . 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 Diabetes mellitus    dx 1998.  was told prior to getting chemo that her bld sugar rose.  She thought it would go back  . Dyspnea    d/t anemia  . GERD (gastroesophageal reflux disease)   . Heart murmur    as child  . Hernia, incisional   . Hyperlipidemia   . Hypertension   . Hypothyroidism   . Vitamin D deficiency       Past Surgical History:  Procedure Laterality Date  . ABDOMINAL HYSTERECTOMY    . APPENDECTOMY    . BREAST SURGERY    . CHOLECYSTECTOMY  01/22/2018   LAPROSCOPIC   . CHOLECYSTECTOMY N/A 01/22/2018   Procedure: LAPAROSCOPIC CHOLECYSTECTOMY WITH INTRAOPERATIVE CHOLANGIOGRAM;  Surgeon: Donnie Mesa, MD;  Location: [facility];  Service: General;  Laterality: N/A;  . COLONOSCOPY  2013   due next 03-2017  . EYE SURGERY     bil cataracts  . IUD REMOVAL     with appendectomy  . MASTECTOMY, MODIFIED RADICAL W/RECONSTRUCTION Left 15 years ago   10 nodes out  . TUMOR EXCISION Left    x 2, neck, head      Social History:      Social History   Tobacco Use  . Smoking status: Never Smoker  . Smokeless tobacco: Never Used  Substance Use Topics  . Alcohol use: No       Family History :     Family History  Problem Relation Age of Onset  . Diabetes Other        both sides of family  . Hypertension Father   . Congestive Heart Failure Father   . Peripheral vascular disease Father   . Hypertension Maternal Grandfather   . Hypertension Maternal Grandmother   . Stomach cancer Maternal Grandmother        GGM  . Heart attack Brother   . Colon cancer Neg Hx   . Esophageal cancer Neg Hx   . Pancreatic cancer Neg Hx   . Rectal cancer Neg Hx          Home Medications:   Prior to Admission medications   Medication Sig Start Date End Date Taking? Authorizing Provider  amLODipine (NORVASC) 5 MG tablet Take 5 mg by mouth daily. 10/16/19  Yes [provider]  atorvastatin (LIPITOR) 10 MG tablet TAKE 1 TABLET BY MOUTH EVERY DAY ONE TIME ONLY Patient taking differently: Take 10 mg by mouth daily.  08/20/18  Yes Park Liter, MD  calcium carbonate (OSCAL) 1500 (600 Ca) MG TABS tablet Take 600 mg by mouth daily with breakfast.    Yes [provider]  carvedilol (COREG) 6.25 MG tablet Take 1 tablet (6.25 mg total) by mouth 2 (two) times daily. 08/07/19  Yes Park Liter, MD  fenofibrate 160 MG tablet Take 160 mg by mouth daily.   Yes [provider]  fish oil-omega-3 fatty acids 1000 MG capsule Take 1 g by mouth daily.    Yes [provider]  furosemide (LASIX) 20 MG tablet TAKE 2 TABLETS BY MOUTH EVERY DAY Patient taking differently: Take 40 mg by mouth daily.  11/10/19  Yes Park Liter, MD  Insulin Glargine (LANTUS SOLOSTAR) 100 UNIT/ML Solostar Pen Inject 4-6 Units into the skin See admin instructions. Use 6 units every morning and use 4 units every evening   Yes [provider]  insulin lispro (HUMALOG KWIKPEN) 100 UNIT/ML KiwkPen Inject 5-20 Units into the skin 3 (three) times daily. Sliding Scale   Yes [provider]  iron polysaccharides (NIFEREX) 150 MG capsule Take 150 mg by mouth 2 (two) times daily.    Yes [provider]  levothyroxine (SYNTHROID, LEVOTHROID) 112 MCG tablet Take 112 mcg by mouth See admin instructions. Take 1 tablet (112 mcg) by mouth on 6 days of the week; hold on Sundays.   Yes [provider]  losartan (COZAAR) 25 MG tablet TAKE 1 TABLET BY MOUTH DAILY Patient taking differently: Take 25 mg by mouth daily.  06/02/19  Yes Park Liter, MD  vitamin C (ASCORBIC ACID) 500 MG tablet Take 500  mg by mouth daily.   Yes  [provider]  Vitamin D, Ergocalciferol, (DRISDOL) 50000 UNITS CAPS Take 50,000 Units by mouth 2 (two) times a week. Sundays & Wednesdays. 02/20/12  Yes [provider]  Continuous Blood Gluc Sensor (FREESTYLE LIBRE 14 DAY SENSOR) MISC See admin instructions. 10/22/19   [provider]  ONETOUCH VERIO test strip USE TO TEST UP TO 10 TIMES D 08/24/17   [provider]     Allergies:     Allergies  Allergen Reactions  . Erythromycin Shortness Of Breath and Swelling    "throat swelling", sob, thrashing ,   . Penicillins Rash    Has patient had a PCN reaction causing immediate rash, facial/tongue/throat swelling, SOB or lightheadedness with hypotension: Unknown Has patient had a PCN reaction causing severe rash involving mucus membranes or skin necrosis: No Has patient had a PCN reaction that required hospitalization: No Has patient had a PCN reaction occurring within the last 10 years: No If all of the above answers are "NO", then may proceed with Cephalosporin use.   Marland Kitchen Nyquil [Pseudoeph-Doxylamine-Dm-Apap] Rash  . Sulfa Antibiotics Rash     Physical Exam:   Vitals  Blood pressure (!) 180/77, pulse 93, temperature 98.3 F (36.8 C), temperature source Oral, resp. rate (!) 27, SpO2 95 %.  1.  General: axoxo3  2. Psychiatric: euthymic  3. Neurologic: nonfocal  4. HEENMT:  Anicteric, pupils 1.62m symmetric, direct, consensual intact Neck: equivocal jvd, no bruit  5. Respiratory : Slight bibasilar crackles no wheezing  6. Cardiovascular : rrr s1, s2, no m/g/r  7. Gastrointestinal:  Abd: soft, nt, nd, +bs  8. Skin:  Ext: no c/c/e, no rash  9.Musculoskeletal:  Good ROM    Data Review:    CBC Recent Labs  Lab 11/18/19 1002 11/20/19 0049  WBC 9.2 8.3  HGB 9.2* 9.8*  HCT 29.2* 31.2*  PLT 313 296  MCV 89.3 91.0  MCH 28.1 28.6  MCHC 31.5 31.4  RDW 15.7* 15.3  LYMPHSABS 1.5 0.5*  MONOABS 0.8 0.9  EOSABS 0.3 0.1    BASOSABS 0.0 0.0   ------------------------------------------------------------------------------------------------------------------  Results for orders placed or performed during the hospital encounter of 11/20/19 (from the past 48 hour(s))  CBG monitoring, ED     Status: Abnormal   Collection Time: 11/20/19 12:37 AM  Result Value Ref Range   Glucose-Capillary 502 (HH) 70 - 99 mg/dL   Comment 1 Notify RN   Comprehensive metabolic panel     Status: Abnormal   Collection Time: 11/20/19 12:49 AM  Result Value Ref Range   Sodium 132 (L) 135 - 145 mmol/L   Potassium 4.2 3.5 - 5.1 mmol/L   Chloride 96 (L) 98 - 111 mmol/L   CO2 25 22 - 32 mmol/L   Glucose, Bld 515 (HH) 70 - 99 mg/dL    Comment: CRITICAL RESULT CALLED TO, READ BACK BY AND VERIFIED WITH: RN L VENEGAS _0  11/20/19 BY S GEZAHEGN    BUN 36 (H) 8 - 23 mg/dL   Creatinine, Ser 1.73 (H) 0.44 - 1.00 mg/dL   Calcium 8.8 (L) 8.9 - 10.3 mg/dL   Total Protein 6.8 6.5 - 8.1 g/dL   Albumin 3.2 (L) 3.5 - 5.0 g/dL   AST 44 (H) 15 - 41 U/L   ALT 24 0 - 44 U/L   Alkaline Phosphatase 68 38 - 126 U/L   Total Bilirubin 1.1 0.3 - 1.2 mg/dL   GFR calc non Af Amer 28 (  L) >60 mL/min   GFR calc Af Amer 32 (L) >60 mL/min   Anion gap 11 5 - 15    Comment: Performed at [facility] [address], [place], [place] [address]  CBC with Differential     Status: Abnormal   Collection Time: 11/20/19 12:49 AM  Result Value Ref Range   WBC 8.3 4.0 - 10.5 K/uL   RBC 3.43 (L) 3.87 - 5.11 MIL/uL   Hemoglobin 9.8 (L) 12.0 - 15.0 g/dL   HCT 31.2 (L) 36.0 - 46.0 %   MCV 91.0 80.0 - 100.0 fL   MCH 28.6 26.0 - 34.0 pg   MCHC 31.4 30.0 - 36.0 g/dL   RDW 15.3 11.5 - 15.5 %   Platelets 296 150 - 400 K/uL   nRBC 0.0 0.0 - 0.2 %   Neutrophils Relative % 80 %   Neutro Abs 6.6 1.7 - 7.7 K/uL   Lymphocytes Relative 6 %   Lymphs Abs 0.5 (L) 0.7 - 4.0 K/uL   Monocytes Relative 11 %   Monocytes Absolute 0.9 0.1 - 1.0 K/uL   Eosinophils Relative 1 %    Eosinophils Absolute 0.1 0.0 - 0.5 K/uL   Basophils Relative 1 %   Basophils Absolute 0.0 0.0 - 0.1 K/uL   Immature Granulocytes 1 %   Abs Immature Granulocytes 0.09 (H) 0.00 - 0.07 K/uL    Comment: Performed at [facility] [address], [place], [place] [address]  Brain natriuretic peptide     Status: Abnormal   Collection Time: 11/20/19 12:49 AM  Result Value Ref Range   B Natriuretic Peptide 484.5 (H) 0.0 - 100.0 pg/mL    Comment: Performed at [facility] [address] [place], [place] [address]  Troponin I (High Sensitivity)     Status: Abnormal   Collection Time: 11/20/19 12:49 AM  Result Value Ref Range   Troponin I (High Sensitivity) 21 (H) <18 ng/L    Comment: (NOTE) Elevated high sensitivity troponin I (hsTnI) values and significant  changes across serial measurements may suggest ACS but many other  chronic and acute conditions are known to elevate hsTnI results.  Refer to the Links section for chest pain algorithms and additional  guidance. Performed at [facility], [place] [address] [place], [place] [address]   POC SARS Coronavirus 2 Ag-ED - Nasal Swab (BD Veritor Kit)     Status: None   Collection Time: 11/20/19  1:27 AM  Result Value Ref Range   SARS Coronavirus 2 Ag NEGATIVE NEGATIVE    Comment: (NOTE) SARS-CoV-2 antigen NOT DETECTED.  Negative results are presumptive.  Negative results do not preclude SARS-CoV-2 infection and should not be used as the sole basis for treatment or other patient management decisions, including infection  control decisions, particularly in the presence of clinical signs and  symptoms consistent with COVID-19, or in those who have been in contact with the virus.  Negative results must be combined with clinical observations, patient history, and epidemiological information. The expected result is Negative. Fact Sheet for Patients: PodPark.tn Fact Sheet for Healthcare  Providers: GiftContent.is This test is not yet approved or cleared by the [place] FDA and  has been authorized for detection and/or diagnosis of SARS-CoV-2 by FDA under an Emergency Use Authorization (EUA).  This EUA will remain in effect (meaning this test can be used) for the duration of  the COVID-19 de claration under Section 564(b)(1) of the Act, 21  U.S.C. section 360bbb-3(b)(1), unless the authorization is terminated or revoked sooner.     Chemistries  Recent Labs  Lab 11/20/19 0049  NA 132*  K 4.2  CL 96*  CO2 25  GLUCOSE 515*  BUN 36*  CREATININE 1.73*  CALCIUM 8.8*  AST 44*  ALT 24  ALKPHOS 68  BILITOT 1.1   ------------------------------------------------------------------------------------------------------------------  ------------------------------------------------------------------------------------------------------------------ GFR: CrCl cannot be calculated (Unknown ideal weight.). Liver Function Tests: Recent Labs  Lab 11/20/19 0049  AST 44*  ALT 24  ALKPHOS 68  BILITOT 1.1  PROT 6.8  ALBUMIN 3.2*   No results for input(s): LIPASE, AMYLASE in the last 168 hours. No results for input(s): AMMONIA in the last 168 hours. Coagulation Profile: No results for input(s): INR, PROTIME in the last 168 hours. Cardiac Enzymes: No results for input(s): CKTOTAL, CKMB, CKMBINDEX, TROPONINI in the last 168 hours. BNP (last 3 results) Recent Labs    08/18/19 1035  PROBNP 1,864*   HbA1C: No results for input(s): HGBA1C in the last 72 hours. CBG: Recent Labs  Lab 11/20/19 0037  GLUCAP 502*   Lipid Profile: No results for input(s): CHOL, HDL, LDLCALC, TRIG, CHOLHDL, LDLDIRECT in the last 72 hours. Thyroid Function Tests: No results for input(s): TSH, T4TOTAL, FREET4, T3FREE, THYROIDAB in the last 72 hours. Anemia Panel: No results for input(s): VITAMINB12, FOLATE, FERRITIN, TIBC, IRON, RETICCTPCT in the last 72  hours.  --------------------------------------------------------------------------------------------------------------- Urine analysis:    Component Value Date/Time   COLORURINE STRAW (A) 09/04/2018 1337   APPEARANCEUR CLEAR 09/04/2018 1337   LABSPEC 1.008 09/04/2018 1337   PHURINE 7.0 09/04/2018 1337   GLUCOSEU >=500 (A) 09/04/2018 1337   HGBUR NEGATIVE 09/04/2018 1337   BILIRUBINUR NEGATIVE 09/04/2018 1337   KETONESUR NEGATIVE 09/04/2018 1337   PROTEINUR 30 (A) 09/04/2018 1337   UROBILINOGEN 0.2 04/12/2013 1421   NITRITE NEGATIVE 09/04/2018 1337   LEUKOCYTESUR NEGATIVE 09/04/2018 1337      Imaging Results:    DG Chest Portable 1 View  Result Date: 11/20/2019 CLINICAL DATA:  Dyspnea EXAM: PORTABLE CHEST 1 VIEW COMPARISON:  September 04, 2018 FINDINGS: The heart size is enlarged. There are small bilateral pleural effusions. There are prominent interstitial lung markings, new from prior study. There is no pneumothorax. Atelectasis is suspected at the lung bases. There are a few nodular opacities in the right upper lobe that appear to be new since prior study. IMPRESSION: 1. Cardiomegaly with findings suggestive of congestive heart failure. An atypical infectious process can have a similar appearance. 2. Small nodular densities in the right upper lobe. Attention on follow-up examinations is recommended. Electronically Signed   By: Constance Holster M.D.   On: 11/20/2019 01:22      Assessment & Plan:    Active Problems:   Diabetes mellitus (HCC)   Hyperlipidemia   Anemia   Essential hypertension   Dyspnea   Acute diastolic CHF (congestive heart failure) (HCC)  Dyspnea secondary to covid-19 , CHF covid-19 labs , if d dimer is positive then please order VQ scan r/o PE Dexamethasone 57m iv qday Remdesivir pharmacy to dose  Acute on chronic CHF Trop I Check Cardiac echo Start Lasix 419miv qday Cont Losartan 2555mo qday Cont Carvedilol 6.67m60m  bid  Hyponatremia Monitor Check cmp in am  Abnormal liver function probably related to covid Check acute hepatitis panel Check RUQ ultrasound Check cmp in am  Hyperglycemia, Dm2 Initial on insulin GTT Please start Fsbs ac and qhs, ISS (when sugar <200)  Anemia  Check cbc in am  Hyperlipidemia Cont Lipitor 74m po qhs Cont Fenofibrate   Hypothyroidism Cont Levothyroxine 112 micrograms po qday    DVT Prophylaxis-   Lovenox - SCDs  AM Labs Ordered, also please review Full Orders  Family Communication: Admission, patients condition and plan of care including tests being ordered have been discussed with the patient  who indicate understanding and agree with the plan and Code Status.  Code Status:  FULL CODE per patient,  Pt doesn't want me to contact her husband  Admission status:  Inpatient: Based on patients clinical presentation and evaluation of above clinical data, I have made determination that patient meets Inpatient criteria at this time.  Pt has covid-19 and infiltrates and also possibly CHF exacerbation, pt has high risk of clinical deterioration,  Pt will require > 2 nites stay.   Time spent in minutes : 70 minutes   JJani GravelM.D on 11/20/2019 at 2:40 AM

## 2019-11-20 NOTE — ED Provider Notes (Signed)
[facility] EMERGENCY DEPARTMENT Provider Note   CSN: [id number] Arrival date & time: 11/20/19  6503     History Chief Complaint  Patient presents with  . Congestive Heart Failure  . Hypertension  . Hyperglycemia    Elizabeth Olsen is a 79 y.o. female with history of CHF last EF 55 to 60%, diabetes mellitus, hypertension, hyperlipidemia, and hypothyroidism who presents to the emergency department via EMS with complaints of progressively worsening shortness of breath over the past 4 days.  Patient states she was initially short of breath with exertion/activity, now she is constantly short of breath including at rest.  Other than exertion no other aggravating factors, no alleviating factors.  She reports associated acute on chronic lower extremity swelling.  She states her symptoms feel similar to prior CHF issues.  She has been taking her Lasix as prescribed with the exception that she forgot to take her morning meds today. She has chronic dry cough that is unchanged. She denies fever, chills, chest pain, nausea, vomiting, diaphoresis, or syncope. Denies unilateral leg pain/swelling, hemoptysis, recent surgery/trauma, recent long travel, hormone use,  or hx of DVT/PE. Prior hx of breast cancer > 10 years ago, in remission. Per EMS in the 80s on RA, improved with 2L O2 via [place].  Her blood sugar was noted to be 567, she took 7 units of humalog PTA.   HPI     Past Medical History:  Diagnosis Date  . Anemia   . Blood transfusion   . Blood transfusion without reported diagnosis    with breast surgery  . Breast cancer ([place]) 15 years ago   left   . CHF (congestive heart failure) ([place])   . 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 Diabetes mellitus    dx 1998.  was told prior to getting chemo that her bld sugar rose.  She thought it would go back  . Dyspnea    d/t anemia  . GERD (gastroesophageal reflux disease)   . Heart murmur    as child  . Hernia, incisional   .  Hyperlipidemia   . Hypertension   . Hypothyroidism   . Vitamin D deficiency     Patient Active Problem List   Diagnosis Date Noted  . Essential hypertension 09/11/2018  . Vertigo 06/24/2018  . Near syncope 04/01/2018  . 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 Anemia of chronic disease 12/10/2017  . Refractory anemia, unspecified ([place]) 12/10/2017  . Anemia 11/07/2017  . Acute systolic heart failure ([place]) 10/13/2017  . Acute respiratory failure with hypoxia ([place]) 10/13/2017  . Hypothyroid 10/13/2017  . Anemia associated with diabetes mellitus ([place]) 10/13/2017  . Diabetes mellitus ([place]) 07/23/2017  . Hyperlipidemia 07/23/2017  . Iron deficiency 07/23/2017  . Cardiomyopathy ([place]) 07/23/2017    Past Surgical History:  Procedure Laterality Date  . ABDOMINAL HYSTERECTOMY    . APPENDECTOMY    . BREAST SURGERY    . CHOLECYSTECTOMY  01/22/2018   LAPROSCOPIC   . CHOLECYSTECTOMY N/A 01/22/2018   Procedure: LAPAROSCOPIC CHOLECYSTECTOMY WITH INTRAOPERATIVE CHOLANGIOGRAM;  Surgeon: Donnie Mesa, MD;  Location: [facility];  Service: General;  Laterality: N/A;  . COLONOSCOPY  2013   due next 03-2017  . EYE SURGERY     bil cataracts  . IUD REMOVAL     with appendectomy  . MASTECTOMY, MODIFIED RADICAL W/RECONSTRUCTION Left 15 years ago   10 nodes out  . TUMOR EXCISION Left    x 2, neck, head  OB History   No obstetric history on file.     Family History  Problem Relation Age of Onset  . Diabetes Other        both sides of family  . Hypertension Father   . Congestive Heart Failure Father   . Peripheral vascular disease Father   . Hypertension Maternal Grandfather   . Hypertension Maternal Grandmother   . Stomach cancer Maternal Grandmother        GGM  . Heart attack Brother   . Colon cancer Neg Hx   . Esophageal cancer Neg Hx   . Pancreatic cancer Neg Hx   . Rectal cancer Neg Hx     Social History   Tobacco Use  . Smoking status: Never Smoker  . Smokeless  tobacco: Never Used  Substance Use Topics  . Alcohol use: No  . Drug use: No    Home Medications Prior to Admission medications   Medication Sig Start Date End Date Taking? Authorizing Provider  amLODipine (NORVASC) 5 MG tablet Take 5 mg by mouth daily. 10/16/19   [provider]  atorvastatin (LIPITOR) 10 MG tablet TAKE 1 TABLET BY MOUTH EVERY DAY ONE TIME ONLY Patient taking differently: Take 10 mg by mouth daily.  08/20/18   Park Liter, MD  calcium carbonate (OSCAL) 1500 (600 Ca) MG TABS tablet Take 600 mg by mouth daily with breakfast.     [provider]  carvedilol (COREG) 6.25 MG tablet Take 1 tablet (6.25 mg total) by mouth 2 (two) times daily. 08/07/19   Park Liter, MD  Continuous Blood Gluc Sensor (FREESTYLE LIBRE 14 DAY SENSOR) MISC See admin instructions. 10/22/19   [provider]  fenofibrate 160 MG tablet Take 160 mg by mouth daily.    [provider]  fish oil-omega-3 fatty acids 1000 MG capsule Take 1 g by mouth daily.     [provider]  furosemide (LASIX) 20 MG tablet TAKE 2 TABLETS BY MOUTH EVERY DAY 11/10/19   Park Liter, MD  Insulin Glargine (LANTUS SOLOSTAR) 100 UNIT/ML Solostar Pen Inject 8 Units into the skin 2 (two) times daily.     [provider]  insulin lispro (HUMALOG KWIKPEN) 100 UNIT/ML KiwkPen Inject 5-20 Units into the skin 3 (three) times daily. Sliding Scale    [provider]  iron polysaccharides (NIFEREX) 150 MG capsule Take 150 mg by mouth 2 (two) times daily.     [provider]  levothyroxine (SYNTHROID, LEVOTHROID) 112 MCG tablet Take 112 mcg by mouth See admin instructions. Take 1 tablet (112 mcg) by mouth on 6 days of the week; hold on Sundays.    [provider]  losartan (COZAAR) 25 MG tablet TAKE 1 TABLET BY MOUTH DAILY 06/02/19   Park Liter, MD  [employer] VERIO test strip USE TO TEST UP TO 10 TIMES D 08/24/17   [provider]  vitamin C (ASCORBIC ACID) 500 MG tablet Take 500 mg by mouth daily.    [provider]  Vitamin D, Ergocalciferol, (DRISDOL) 50000 UNITS CAPS Take 50,000 Units by mouth 2 (two) times a week. Sundays & Wednesdays. 02/20/12   [provider]    Allergies    Erythromycin, Penicillins, Nyquil [pseudoeph-doxylamine-dm-apap], and Sulfa antibiotics  Review of Systems   Review of Systems  Constitutional: Negative for chills and fever.  HENT: Negative for congestion, ear pain and sore throat.   Respiratory: Positive for cough (chronic unchanged) and shortness of breath.  Cardiovascular: Positive for leg swelling. Negative for chest pain.  Gastrointestinal: Negative for abdominal pain, blood in stool, constipation, diarrhea, nausea and vomiting.  Genitourinary: Negative for dysuria.  Skin: Wound:    Neurological: Negative for syncope.  All other systems reviewed and are negative.   Physical Exam Updated Vital Signs BP (!) 186/78   Pulse 93   Temp 98.3 F (36.8 C) (Oral)   Resp (!) 27   SpO2 96%   Physical Exam Vitals and nursing note reviewed.  Constitutional:      General: She is not in acute distress.    Appearance: She is well-developed. She is not ill-appearing or toxic-appearing.  HENT:     Head: Normocephalic and atraumatic.  Eyes:     General:        Right eye: No discharge.        Left eye: No discharge.     Conjunctiva/sclera: Conjunctivae normal.  Cardiovascular:     Rate and Rhythm: Normal rate and regular rhythm.     Pulses:          Dorsalis pedis pulses are 2+ on the right side and 2+ on the left side.       Posterior tibial pulses are 2+ on the right side and 2+ on the left side.  Pulmonary:     Effort: No respiratory distress.     Breath sounds: Rales (bibasilar) present. No wheezing or rhonchi.     Comments: SpO2 95% on RA. Tachypneic with speaking but able to speak in full sentences.  Abdominal:     General: There is no distension.      Palpations: Abdomen is soft.     Tenderness: There is no abdominal tenderness.  Musculoskeletal:     Cervical back: Neck supple.     Comments: Lower extremities: trace to 1+ symmetric pitting edema to lower legs, no calf tenderness. Left lower leg anterior skin abrasion/tear scabbed over, hemostatic, no surrounding erythema/warmth/drainage. Intact AROM.   Skin:    General: Skin is warm and dry.     Capillary Refill: Capillary refill takes less than 2 seconds.     Findings: No rash.  Neurological:     Mental Status: She is alert.     Comments: Alert. Clear speech. Sensation grossly intact to bilateral lower extremities. 5/5 strength with plantar/dorsiflexion bilaterally.   Psychiatric:        Mood and Affect: Mood normal.        Behavior: Behavior normal.    ED Results / Procedures / Treatments   Labs (all labs ordered are listed, but only abnormal results are displayed) Labs Reviewed  CBC WITH DIFFERENTIAL/PLATELET - Abnormal; Notable for the following components:      Result Value   RBC 3.43 (*)    Hemoglobin 9.8 (*)    HCT 31.2 (*)    Lymphs Abs 0.5 (*)    Abs Immature Granulocytes 0.09 (*)    All other components within normal limits  CBG MONITORING, ED - Abnormal; Notable for the following components:   Glucose-Capillary 502 (*)    All other components within normal limits  COMPREHENSIVE METABOLIC PANEL  BRAIN NATRIURETIC PEPTIDE  POC SARS CORONAVIRUS 2 AG -  ED  TROPONIN I (HIGH SENSITIVITY)    EKG None  Radiology DG Chest Portable 1 View  Result Date: 11/20/2019 CLINICAL DATA:  Dyspnea EXAM: PORTABLE CHEST 1 VIEW COMPARISON:  September 04, 2018 FINDINGS: The heart size is enlarged. There are small bilateral pleural effusions. There  are prominent interstitial lung markings, new from prior study. There is no pneumothorax. Atelectasis is suspected at the lung bases. There are a few nodular opacities in the right upper lobe that appear to be new since prior study.  IMPRESSION: 1. Cardiomegaly with findings suggestive of congestive heart failure. An atypical infectious process can have a similar appearance. 2. Small nodular densities in the right upper lobe. Attention on follow-up examinations is recommended. Electronically Signed   By: Constance Holster M.D.   On: 11/20/2019 01:22    Procedures .Critical Care Performed by: Amaryllis Dyke, PA-C Authorized by: Amaryllis Dyke, PA-C      CRITICAL CARE Performed by: Kennith Maes   Total critical care time: 40 minutes  Critical care time was exclusive of separately billable procedures and treating other patients.  Critical care was necessary to treat or prevent imminent or life-threatening deterioration.  Critical care was time spent personally by me on the following activities: development of treatment plan with patient and/or surrogate as well as nursing, discussions with consultants, evaluation of patient's response to treatment, examination of patient, obtaining history from patient or surrogate, ordering and performing treatments and interventions, ordering and review of laboratory studies, ordering and review of radiographic studies, pulse oximetry and re-evaluation of patient's condition.   (including critical care time)  Medications Ordered in ED Medications  insulin regular, human (MYXREDLIN) 100 units/ 100 mL infusion (has no administration in time range)  furosemide (LASIX) injection 40 mg (has no administration in time range)  potassium chloride SA (KLOR-CON) CR tablet 40 mEq (has no administration in time range)  Tdap (BOOSTRIX) injection 0.5 mL (has no administration in time range)  bacitracin ointment (has no administration in time range)    ED Course  I have reviewed the triage vital signs and the nursing notes.  Pertinent labs & imaging results that were available during my care of the patient were reviewed by me and considered in my medical decision  making (see chart for details).  OASIS GOEHRING was evaluated in Emergency Department on 11/20/2019 for the symptoms described in the history of present illness. He/she was evaluated in the context of the global COVID-19 pandemic, which necessitated consideration that the patient might be at risk for infection with the SARS-CoV-2 virus that causes COVID-19. Institutional protocols and algorithms that pertain to the evaluation of patients at risk for COVID-19 are in a state of rapid change based on information released by regulatory bodies including the CDC and federal and state organizations. These policies and algorithms were followed during the patient's care in the ED.  MDM Rules/Calculators/A&P                      Patient presents to the ED with complaints of dyspnea.  Hypertensive, mildly tachypneic with speaking, SpO2 95% on RA on my evaluation.  Bibasilar rales, peripheral edema noted. DDX: CHF exacerbation, atypical ACS, PE, pneumonia, critical anemia. She is hyperglycemic. She does have small skin abrasion/tear to the left lower anterior leg- tetanus updated, bacitracin & wound care ordered- NVI distally.   CBC: Anemia consistent w/ prior.  CMP: Critical hyperglycemia w/o acidosis or anion gap elevation. Renal function similar to prior. NO significant electrolyte derangement- mild abnormalities as above.  BNP: 484.5 Troponin: 21. Rapid covid; negative CXR: Cardiomegaly with findings suggestive of congestive heart failure. An atypical infectious process can have a similar appearance. Small nodular densities in the right upper lobe. EKG: No STEMI.   H&P  with ED evaluation seem most consistent w/ CHF exacerbation also has hyperglycemia without evidence of DKA currently. Will give IV lasix, start insulin drip (fluids not started secondary to fluid overload w/ CHF discussed w/ Dr. Randal Buba in agreement), & call for admission.   02:26: CONSULT: Discussed with hospitalist Dr. Maudie Mercury- accepts  admission.   Findings and plan of care discussed with supervising physician Dr. Randal Buba who has evaluated the patient & is in agreement.   Final Clinical Impression(s) / ED Diagnoses Final diagnoses:  Acute on chronic congestive heart failure, unspecified heart failure type [facility])  Hyperglycemia    Rx / DC Orders ED Discharge Orders    None       Amaryllis Dyke, PA-C 11/20/19 0242    Palumbo, April, MD 11/20/19 [phone]

## 2019-11-20 NOTE — ED Notes (Signed)
Dinner Tray Ordered @ 1854.  

## 2019-11-20 NOTE — ED Triage Notes (Signed)
Pt BIB [facility] from home. Pt SOB x 4 days, progressively worsening and worse with exertion. Upon arrival, EMS placed on 2L [place] for sats in the 80s. Legs obviously swollen  Hx of CHF, Diabetes, HTN.  CBG w/ EMS: 567; pt took 7 units of Humalog PTA  EMS BP: 210/100  A&O x 4

## 2019-11-21 ENCOUNTER — Inpatient Hospital Stay (HOSPITAL_COMMUNITY): Payer: Medicare Other

## 2019-11-21 ENCOUNTER — Other Ambulatory Visit: Payer: Self-pay

## 2019-11-21 DIAGNOSIS — U071 COVID-19: Principal | ICD-10-CM

## 2019-11-21 LAB — CBC
HCT: 26.6 % — ABNORMAL LOW (ref 36.0–46.0)
HCT: 29.8 % — ABNORMAL LOW (ref 36.0–46.0)
Hemoglobin: 8.4 g/dL — ABNORMAL LOW (ref 12.0–15.0)
Hemoglobin: 9 g/dL — ABNORMAL LOW (ref 12.0–15.0)
MCH: 28.5 pg (ref 26.0–34.0)
MCH: 27.7 pg (ref 26.0–34.0)
MCHC: 31.6 g/dL (ref 30.0–36.0)
MCHC: 30.2 g/dL (ref 30.0–36.0)
MCV: 90.2 fL (ref 80.0–100.0)
MCV: 91.7 fL (ref 80.0–100.0)
Platelets: 319 10*3/uL (ref 150–400)
Platelets: 416 10*3/uL — ABNORMAL HIGH (ref 150–400)
RBC: 2.95 MIL/uL — ABNORMAL LOW (ref 3.87–5.11)
RBC: 3.25 MIL/uL — ABNORMAL LOW (ref 3.87–5.11)
RDW: 15.8 % — ABNORMAL HIGH (ref 11.5–15.5)
RDW: 15.9 % — ABNORMAL HIGH (ref 11.5–15.5)
WBC: 8.9 10*3/uL (ref 4.0–10.5)
WBC: 8.7 10*3/uL (ref 4.0–10.5)
nRBC: 0.2 % (ref 0.0–0.2)
nRBC: 0 % (ref 0.0–0.2)

## 2019-11-21 LAB — COMPREHENSIVE METABOLIC PANEL
ALT: 19 U/L (ref 0–44)
AST: 30 U/L (ref 15–41)
Albumin: 2.5 g/dL — ABNORMAL LOW (ref 3.5–5.0)
Alkaline Phosphatase: 53 U/L (ref 38–126)
Anion gap: 9 (ref 5–15)
BUN: 41 mg/dL — ABNORMAL HIGH (ref 8–23)
CO2: 25 mmol/L (ref 22–32)
Calcium: 8.4 mg/dL — ABNORMAL LOW (ref 8.9–10.3)
Chloride: 102 mmol/L (ref 98–111)
Creatinine, Ser: 2.19 mg/dL — ABNORMAL HIGH (ref 0.44–1.00)
GFR calc Af Amer: 24 mL/min — ABNORMAL LOW (ref >60)
GFR calc non Af Amer: 21 mL/min — ABNORMAL LOW (ref >60)
Glucose, Bld: 228 mg/dL — ABNORMAL HIGH (ref 70–99)
Potassium: 4.7 mmol/L (ref 3.5–5.1)
Sodium: 136 mmol/L (ref 135–145)
Total Bilirubin: 0.4 mg/dL (ref 0.3–1.2)
Total Protein: 5.2 g/dL — ABNORMAL LOW (ref 6.5–8.1)

## 2019-11-21 LAB — GLUCOSE, CAPILLARY
Glucose-Capillary: 355 mg/dL — ABNORMAL HIGH (ref 70–99)
Glucose-Capillary: 320 mg/dL — ABNORMAL HIGH (ref 70–99)
Glucose-Capillary: 294 mg/dL — ABNORMAL HIGH (ref 70–99)
Glucose-Capillary: 229 mg/dL — ABNORMAL HIGH (ref 70–99)

## 2019-11-21 LAB — C-REACTIVE PROTEIN: CRP: 0.8 mg/dL (ref <1.0)

## 2019-11-21 LAB — OCCULT BLOOD X 1 CARD TO LAB, STOOL: Fecal Occult Bld: POSITIVE — AB

## 2019-11-21 MED ORDER — TECHNETIUM TO 99M ALBUMIN AGGREGATED
1.5000 | Freq: Once | INTRAVENOUS | Status: AC | PRN
  Administered 2019-11-21: 13:00:00 1.5 via INTRAVENOUS

## 2019-11-21 MED ORDER — SODIUM CHLORIDE 0.9 % IV SOLN: 250.0000 mL | INTRAVENOUS | Status: DC | PRN

## 2019-11-21 NOTE — Progress Notes (Signed)
The chaplain visited with the patient over the phone for prayer.  The patient was very receptive of the call and shared her spiritual strength with the chaplain. The chaplain does have a need to follow-up.  Brion Aliment Chaplain Resident For questions concerning this note please contact me by pager [phone]

## 2019-11-21 NOTE — Progress Notes (Signed)
Inpatient Diabetes Program Recommendations  AACE/ADA: New Consensus Statement on Inpatient Glycemic Control (2015)  Target Ranges:  Prepandial:   less than 140 mg/dL      Peak postprandial:   less than 180 mg/dL (1-2 hours)      Critically ill patients:  140 - 180 mg/dL   Lab Results  Component Value Date   GLUCAP 355 (H) 11/21/2019   HGBA1C 9.0 (H) 11/20/2019    Review of Glycemic Control Results for Elizabeth Olsen, Elizabeth Olsen (MRN [id number]) as of 11/21/2019 09:38  Ref. Range 11/20/2019 07:46 11/20/2019 11:57 11/20/2019 16:58 11/20/2019 19:20 11/20/2019 21:20 11/21/2019 08:30  Glucose-Capillary Latest Ref Range: 70 - 99 mg/dL 64 (L) 375 (H) 365 (H) 294 (H) 257 (H) 355 (H)   Diabetes history: DM 2 Outpatient Diabetes medications: Lantus 6 units Qam and 4 units qpm, Humalog 5-20 units tid Current orders for Inpatient glycemic control:  Lantus 6 units Qam and 4 units qpm Novolog 0-9 units tid + hs  Decadron 6 mg Daily BUN/Creat: 41/2.19  Inpatient Diabetes Program Recommendations:    - Consider increasing Lantus to 8 units bid.  - Add Novolog 3 units tid meal coverage if eating at least 50% of meals.  Thanks,  Tama Headings RN, MSN, BC-ADM Inpatient Diabetes Coordinator Team Pager [phone] (8a-5p)

## 2019-11-21 NOTE — Progress Notes (Addendum)
PROGRESS NOTE    Elizabeth Olsen  ZMO:[id number] DOB: [birth year] DOA: 11/20/2019 PCP: [name], MD   Brief Narrative:  Elizabeth Olsen  is a 79 y.o. female,  with Jerrye Bushy, vitamin D deficiency, h/o remote breast cancer,  hypothyroidism, hypertension, hyperlipidemia, DM2, CHF (diastolic), presents with dyspnea x 4 days. Slight cough, chronic, (pt attributes cough to her bp medication), slight weigh gain 10 lbs over the past few months.  Slight leg swelling right > left.   Pt denies fever, chills, cp, palp, n/v, diarrhea, brbpr.   Patient is Covid positive, venous duplex negative for DVT/ VQ scan negative for PE.  Assessment & Plan:   Active Problems:   Diabetes mellitus (HCC)   Hyperlipidemia   Anemia   Essential hypertension   Dyspnea   Acute diastolic CHF (congestive heart failure) ([place])   COVID-19 virus infection   # Shortness of breath secondary to COVID-19 infection plus possible CHF exacerbation -Elevated D-dimer: Could be an acute phase reactant versus a clot will get lower extremity duplexes, if unrevealing we will then proceed to a VQ scan, not able to have CTA due to kidney function - Continue Dexamethasone -Continue Remdesivir -Daily labs -Venous duplex negative for DVT, VQ scan negative for PE. -Continue supplemental oxygen to maintain oxygen saturation more than 94%   # Acute on chronic diastolic dysfunction Recent echo shows grade 2 diastolic dysfunction apparently echo was repeated here as well even though it was done 1 month ago -IV Lasix Continue beta-blocker as well as losartan with close monitoring of kidney function  # Acute kidney injury could be dehydration and medication induced. Start gentle hydration, Echo WNL.  Avoid nephrotoxic medications. Consider nephro consult if creatinine continues to rise.  # Hyperglycemia with sugars greater than 500 -Was started on insulin drip, will transition to subcu insulin and sliding scale  # Hyponatremia -Probably due  to volume overload as has improved with IV diuresis   DVT prophylaxis: Lovenox Code Status: Full Family Communication: No one at bed side, d/w patient in detail. Disposition Plan:  Anticipated discharge home in 1 to 2 days after she completes 5 days of remdesivir and Decadron therapy. Consultants:    None  Procedures:  Antimicrobials:  Anti-infectives (From admission, onward)   Start     Dose/Rate Route Frequency Ordered Stop   11/21/19 1000  remdesivir 100 mg in sodium chloride 0.9 % 100 mL IVPB     100 mg 200 mL/hr over 30 Minutes Intravenous Daily 11/20/19 0503 11/25/19 0959   11/20/19 0515  remdesivir 200 mg in sodium chloride 0.9% 250 mL IVPB     200 mg 580 mL/hr over 30 Minutes Intravenous Once 11/20/19 0500 11/20/19 0354      Subjective: Patient was seen and examined at bedside. She appears more comfortable, she was talking to her daughter over the phone. Denies any difficulty breathing, no overnight events.  Objective: Vitals:   11/20/19 2100 11/20/19 2242 11/21/19 0634 11/21/19 0717  BP: (!) 158/64 (!) 154/55  (!) 151/70  Pulse: 88 87  79  Resp: 13 (!) 21  17  Temp:  98 F (36.7 C) 97.6 F (36.4 C)   TempSrc:  Oral    SpO2: 94% 97%  93%  Weight:      Height:        Intake/Output Summary (Last 24 hours) at 11/21/2019 1614 Last data filed at 11/21/2019 0900 Gross per 24 hour  Intake 480 ml  Output -  Net 480 ml  Filed Weights   11/20/19 0755  Weight: 70.3 kg    Examination:  General exam: Appears calm and comfortable  Respiratory system: Clear to auscultation. Respiratory effort normal. Cardiovascular system: S1 & S2 heard, RRR. No JVD, murmurs, rubs, gallops or clicks. No pedal edema. Gastrointestinal system: Abdomen is nondistended, soft and nontender. No organomegaly or masses felt. Normal bowel sounds heard. Central nervous system: Alert and oriented. No focal neurological deficits. Extremities: Symmetric 5 x 5 power. Skin: No rashes, lesions  or ulcers Psychiatry: Judgement and insight appear normal. Mood & affect appropriate.     Data Reviewed: I have personally reviewed following labs and imaging studies  CBC: Recent Labs  Lab 11/18/19 1002 11/20/19 0049 11/20/19 0515 11/21/19 0226 11/21/19 1023  WBC 9.2 8.3 7.8 8.9 8.7  NEUTROABS 6.5 6.6  --   --   --   HGB 9.2* 9.8* 10.6* 8.4* 9.0*  HCT 29.2* 31.2* 35.0* 26.6* 29.8*  MCV 89.3 91.0 93.6 90.2 91.7  PLT 313 296 321 319 998*   Basic Metabolic Panel: Recent Labs  Lab 11/20/19 0049 11/20/19 0515 11/21/19 0226  NA 132* 136 136  K 4.2 4.2 4.7  CL 96* 101 102  CO2 25 23 25   GLUCOSE 515* 235* 228*  BUN 36* 35* 41*  CREATININE 1.73* 1.66* 2.19*  CALCIUM 8.8* 9.0 8.4*   GFR: Estimated Creatinine Clearance: 20.2 mL/min (A) (by C-G formula based on SCr of 2.19 mg/dL (H)). Liver Function Tests: Recent Labs  Lab 11/20/19 0049 11/20/19 0515 11/21/19 0226  AST 44* 42* 30  ALT 24 23 19   ALKPHOS 68 65 53  BILITOT 1.1 0.8 0.4  PROT 6.8 7.1 5.2*  ALBUMIN 3.2* 3.4* 2.5*   No results for input(s): LIPASE, AMYLASE in the last 168 hours. No results for input(s): AMMONIA in the last 168 hours. Coagulation Profile: No results for input(s): INR, PROTIME in the last 168 hours. Cardiac Enzymes: Recent Labs  Lab 11/20/19 0515  CKTOTAL 345*  CKMB 6.1*   BNP (last 3 results) Recent Labs    08/18/19 1035  PROBNP 1,864*   HbA1C: Recent Labs    11/20/19 1038  HGBA1C 9.0*   CBG: Recent Labs  Lab 11/20/19 1658 11/20/19 1920 11/20/19 2120 11/21/19 0830 11/21/19 1300  GLUCAP 365* 294* 257* 355* 320*   Lipid Profile: No results for input(s): CHOL, HDL, LDLCALC, TRIG, CHOLHDL, LDLDIRECT in the last 72 hours. Thyroid Function Tests: No results for input(s): TSH, T4TOTAL, FREET4, T3FREE, THYROIDAB in the last 72 hours. Anemia Panel: No results for input(s): VITAMINB12, FOLATE, FERRITIN, TIBC, IRON, RETICCTPCT in the last 72 hours. Sepsis Labs: Recent  Labs  Lab 11/20/19 0515  LATICACIDVEN 2.2*    Recent Results (from the past 240 hour(s))  Respiratory Panel by RT PCR (Flu A&B, Covid) - Nasopharyngeal Swab     Status: Abnormal   Collection Time: 11/20/19  2:43 AM   Specimen: Nasopharyngeal Swab  Result Value Ref Range Status   SARS Coronavirus 2 by RT PCR POSITIVE (A) NEGATIVE Final    Comment: RESULT CALLED TO, READ BACK BY AND VERIFIED WITH: L VENEGAS RN 11/20/19 0401  JDW (NOTE) SARS-CoV-2 target nucleic acids are DETECTED. SARS-CoV-2 RNA is generally detectable in upper respiratory specimens  during the acute phase of infection. Positive results are indicative of the presence of the identified virus, but do not rule out bacterial infection or co-infection with other pathogens not detected by the test. Clinical correlation with patient history and other diagnostic information  is necessary to determine patient infection status. The expected result is Negative. Fact Sheet for Patients:  PinkCheek.be Fact Sheet for Healthcare Providers: GravelBags.it This test is not yet approved or cleared by the [place] FDA and  has been authorized for detection and/or diagnosis of SARS-CoV-2 by FDA under an Emergency Use Authorization (EUA).  This EUA will remain in effect (meaning this test can be used) for th e duration of  the COVID-19 declaration under Section 564(b)(1) of the Act, 21 U.S.C. section 360bbb-3(b)(1), unless the authorization is terminated or revoked sooner.    Influenza A by PCR NEGATIVE NEGATIVE Final   Influenza B by PCR NEGATIVE NEGATIVE Final    Comment: (NOTE) The Xpert Xpress SARS-CoV-2/FLU/RSV assay is intended as an aid in  the diagnosis of influenza from Nasopharyngeal swab specimens and  should not be used as a sole basis for treatment. Nasal washings and  aspirates are unacceptable for Xpert Xpress SARS-CoV-2/FLU/RSV  testing. Fact Sheet for  Patients: PinkCheek.be Fact Sheet for Healthcare Providers: GravelBags.it This test is not yet approved or cleared by the [place] FDA and  has been authorized for detection and/or diagnosis of SARS-CoV-2 by  FDA under an Emergency Use Authorization (EUA). This EUA will remain  in effect (meaning this test can be used) for the duration of the  Covid-19 declaration under Section 564(b)(1) of the Act, 21  U.S.C. section 360bbb-3(b)(1), unless the authorization is  terminated or revoked. Performed at [facility], [place] [address] [place], [place] [address]      Radiology Studies: NM Pulmonary Perfusion  Result Date: 11/21/2019 CLINICAL DATA:  COVID positive, shortness of breath EXAM: NUCLEAR MEDICINE PERFUSION LUNG SCAN TECHNIQUE: Perfusion images were obtained in multiple projections after intravenous injection of radiopharmaceutical. Ventilation scans intentionally deferred if perfusion scan and chest x-ray adequate for interpretation during COVID 19 epidemic. RADIOPHARMACEUTICALS:  1.5 mCi Tc-77m MAA IV COMPARISON:  None. FINDINGS: No significant perfusion defects of bilateral lungs. IMPRESSION: No scintigraphic evidence of pulmonary embolus. Electronically Signed   By: Kathreen Devoid   On: 11/21/2019 13:04   DG Chest Port 1 View  Result Date: 11/21/2019 CLINICAL DATA:  Preprocedure examination. Dyspnea. Lower extremity edema. EXAM: PORTABLE CHEST 1 VIEW COMPARISON:  Chest x-rays dated 11/20/2019 and 09/04/2018 FINDINGS: There is cardiomegaly and slight prominence of the main pulmonary arteries even considering the AP portable technique. There are no infiltrates or effusions. No acute bone abnormality. The small nodular density seen over the right upper lobe on the prior study are not apparent on this exam. The aeration has improved both lung bases. IMPRESSION: No acute abnormality. Improved aeration at the lung bases.  Persistent cardiomegaly and prominence of the main pulmonary arteries. Aortic Atherosclerosis (ICD10-I70.0). Electronically Signed   By: Lorriane Shire M.D.   On: 11/21/2019 10:59   DG Chest Portable 1 View  Result Date: 11/20/2019 CLINICAL DATA:  Dyspnea EXAM: PORTABLE CHEST 1 VIEW COMPARISON:  September 04, 2018 FINDINGS: The heart size is enlarged. There are small bilateral pleural effusions. There are prominent interstitial lung markings, new from prior study. There is no pneumothorax. Atelectasis is suspected at the lung bases. There are a few nodular opacities in the right upper lobe that appear to be new since prior study. IMPRESSION: 1. Cardiomegaly with findings suggestive of congestive heart failure. An atypical infectious process can have a similar appearance. 2. Small nodular densities in the right upper lobe. Attention on follow-up examinations is recommended. Electronically Signed   By:  Constance Holster M.D.   On: 11/20/2019 01:22   ECHOCARDIOGRAM COMPLETE  Result Date: 11/20/2019   ECHOCARDIOGRAM REPORT   Patient Name:   PARRIS SIGNER [facility] Date of Exam: 11/20/2019 Medical Rec #:  [id number]      Height:       63.0 in Accession #:    [id number]     Weight:       155.0 lb Date of Birth:  1941/01/09       BSA:          1.74 m Patient Age:    89 years       BP:           160/65 mmHg Patient Gender: F              HR:           84 bpm. Exam Location:  Inpatient Procedure: 2D Echo Indications:    Elevated Troponin  History:        Patient has prior history of Echocardiogram examinations, most                 recent 10/08/2019. CHF, Signs/Symptoms:Dyspnea; Risk                 Factors:Dyslipidemia, Diabetes and Hypertension. COVID-19 virus                 infection.  Sonographer:    Vikki Ports Turrentine Referring Phys: [place]  1. Left ventricular ejection fraction, by visual estimation, is 55 to 60%. The left ventricle has normal function. There is mildly increased left ventricular  hypertrophy.  2. Left ventricular diastolic parameters are consistent with Grade I diastolic dysfunction (impaired relaxation).  3. The left ventricle has no regional wall motion abnormalities.  4. Global right ventricle has normal systolic function.The right ventricular size is normal. No increase in right ventricular wall thickness.  5. Left atrial size was moderately dilated.  6. Right atrial size was normal.  7. Moderate mitral annular calcification.  8. The mitral valve is normal in structure. Mild mitral valve regurgitation. No evidence of mitral stenosis.  9. The tricuspid valve is normal in structure. Tricuspid valve regurgitation is trivial. 10. The aortic valve is tricuspid. Aortic valve regurgitation is mild. Mild to moderate aortic valve sclerosis/calcification without any evidence of aortic stenosis. 11. The tricuspid regurgitant velocity is 2.71 m/s, and with an assumed right atrial pressure of 3 mmHg, the estimated right ventricular systolic pressure is mildly elevated at 32.4 mmHg. 12. The inferior vena cava is normal in size with greater than 50% respiratory variability, suggesting right atrial pressure of 3 mmHg. FINDINGS  Left Ventricle: Left ventricular ejection fraction, by visual estimation, is 55 to 60%. The left ventricle has normal function. The left ventricle has no regional wall motion abnormalities. The left ventricular internal cavity size was the left ventricle is normal in size. There is mildly increased left ventricular hypertrophy. Left ventricular diastolic parameters are consistent with Grade I diastolic dysfunction (impaired relaxation). Right Ventricle: The right ventricular size is normal. No increase in right ventricular wall thickness. Global RV systolic function is has normal systolic function. The tricuspid regurgitant velocity is 2.71 m/s, and with an assumed right atrial pressure  of 3 mmHg, the estimated right ventricular systolic pressure is mildly elevated at 32.4 mmHg.  Left Atrium: Left atrial size was moderately dilated. Right Atrium: Right atrial size was normal in size Pericardium: There is no evidence of pericardial effusion. Mitral  Valve: The mitral valve is normal in structure. Moderate mitral annular calcification. Mild mitral valve regurgitation. No evidence of mitral valve stenosis by observation. Tricuspid Valve: The tricuspid valve is normal in structure. Tricuspid valve regurgitation is trivial. Aortic Valve: The aortic valve is tricuspid. Aortic valve regurgitation is mild. Mild to moderate aortic valve sclerosis/calcification is present, without any evidence of aortic stenosis. Aortic valve mean gradient measures 9.0 mmHg. Aortic valve peak gradient measures 15.6 mmHg. Aortic valve area, by VTI measures 1.85 cm. Pulmonic Valve: The pulmonic valve was normal in structure. Pulmonic valve regurgitation is not visualized. Pulmonic regurgitation is not visualized. Aorta: The aortic root is normal in size and structure. Venous: The inferior vena cava is normal in size with greater than 50% respiratory variability, suggesting right atrial pressure of 3 mmHg. IAS/Shunts: No atrial level shunt detected by color flow Doppler.  LEFT VENTRICLE PLAX 2D LVIDd:         4.80 cm  Diastology LVIDs:         3.60 cm  LV e' lateral:   5.98 cm/s LV PW:         0.90 cm  LV E/e' lateral: 17.4 LV IVS:        0.90 cm  LV e' medial:    4.35 cm/s LVOT diam:     2.00 cm  LV E/e' medial:  23.9 LV SV:         53 ml LV SV Index:   29.72 LVOT Area:     3.14 cm  RIGHT VENTRICLE RV S prime:     13.70 cm/s TAPSE (M-mode): 2.8 cm LEFT ATRIUM             Index       RIGHT ATRIUM           Index LA diam:        3.60 cm 2.07 cm/m  RA Area:     17.90 cm LA Vol (A2C):   76.7 ml 44.20 ml/m RA Volume:   49.70 ml  28.64 ml/m LA Vol (A4C):   62.4 ml 35.96 ml/m LA Biplane Vol: 70.2 ml 40.46 ml/m  AORTIC VALVE AV Area (Vmax):    1.62 cm AV Area (Vmean):   1.67 cm AV Area (VTI):     1.85 cm AV Vmax:            197.50 cm/s AV Vmean:          141.000 cm/s AV VTI:            0.418 m AV Peak Grad:      15.6 mmHg AV Mean Grad:      9.0 mmHg LVOT Vmax:         102.00 cm/s LVOT Vmean:        75.000 cm/s LVOT VTI:          0.246 m LVOT/AV VTI ratio: 0.59  AORTA Ao Root diam: 2.80 cm MITRAL VALVE                         TRICUSPID VALVE MV Area (PHT): 5.02 cm              TR Peak grad:   29.4 mmHg MV PHT:        43.79 msec            TR Vmax:        271.00 cm/s MV Decel Time: 151 msec MV E velocity: 104.00  cm/s 103 cm/s  SHUNTS MV A velocity: 114.00 cm/s 70.3 cm/s Systemic VTI:  0.25 m MV E/A ratio:  0.91        1.5       Systemic Diam: 2.00 cm  Loralie Champagne MD Electronically signed by Loralie Champagne MD Signature Date/Time: 11/20/2019/2:34:55 PM    Final    VAS [place] LOWER EXTREMITY VENOUS (DVT)  Result Date: 11/20/2019  Lower Venous Study Indications: Pain, and Swelling. Other Indications: DM, CHF. Risk Factors: Cancer breast. Limitations: Bandages. Comparison Study: No prior study. Performing Technologist: Baldwin Crown ARDMS, RVT  Examination Guidelines: A complete evaluation includes B-mode imaging, spectral Doppler, color Doppler, and power Doppler as needed of all accessible portions of each vessel. Bilateral testing is considered an integral part of a complete examination. Limited examinations for reoccurring indications may be performed as noted.  +---------+---------------+---------+-----------+----------+------------------+ RIGHT    CompressibilityPhasicitySpontaneityPropertiesThrombus Aging     +---------+---------------+---------+-----------+----------+------------------+ CFV      Full           Yes      Yes                                     +---------+---------------+---------+-----------+----------+------------------+ SFJ      Full                                                            +---------+---------------+---------+-----------+----------+------------------+ FV Prox  Full                                                             +---------+---------------+---------+-----------+----------+------------------+ FV Mid   Full                                                            +---------+---------------+---------+-----------+----------+------------------+ FV DistalFull                                                            +---------+---------------+---------+-----------+----------+------------------+ PFV      Full                                                            +---------+---------------+---------+-----------+----------+------------------+ POP      Full           Yes      Yes                                     +---------+---------------+---------+-----------+----------+------------------+  PTV      Full                                         Seen with color                                                          flow               +---------+---------------+---------+-----------+----------+------------------+ PERO     Full                                         Seen with color                                                          flow               +---------+---------------+---------+-----------+----------+------------------+ Poorly visualized calf veins due to patient in pain and swelling.  +---------+---------------+---------+-----------+----------+------------------+ LEFT     CompressibilityPhasicitySpontaneityPropertiesThrombus Aging     +---------+---------------+---------+-----------+----------+------------------+ CFV      Full           Yes      Yes                                     +---------+---------------+---------+-----------+----------+------------------+ SFJ      Full                                                            +---------+---------------+---------+-----------+----------+------------------+ FV Prox  Full                                                             +---------+---------------+---------+-----------+----------+------------------+ FV Mid   Full                                                            +---------+---------------+---------+-----------+----------+------------------+ FV DistalFull                                                            +---------+---------------+---------+-----------+----------+------------------+ PFV      Full                                                            +---------+---------------+---------+-----------+----------+------------------+  POP      Full           Yes      Yes                                     +---------+---------------+---------+-----------+----------+------------------+ PTV      Full                                         Seen with color                                                          flow               +---------+---------------+---------+-----------+----------+------------------+ PERO     Full                                         Seen with color                                                          flow               +---------+---------------+---------+-----------+----------+------------------+ Unable to visualize prox and mid calf veins due to bandages.   Summary: Right: There is no evidence of deep vein thrombosis in the lower extremity. No cystic structure found in the popliteal fossa. Left: There is no evidence of deep vein thrombosis in the lower extremity. No cystic structure found in the popliteal fossa. Unable to visualize prox and mid calf veins due to bandages.  *See table(s) above for measurements and observations. Electronically signed by Monica Martinez MD on 11/20/2019 at 5:00:37 PM.    Final     Scheduled Meds: . amLODipine  5 mg Oral Daily  . atorvastatin  10 mg Oral Daily  . bacitracin   Topical BID  . carvedilol  6.25 mg Oral BID  . dexamethasone (DECADRON) injection  6 mg Intravenous  Q0600  . enoxaparin (LOVENOX) injection  40 mg Subcutaneous Q24H  . fenofibrate  160 mg Oral Daily  . furosemide  40 mg Intravenous Daily  . insulin aspart  0-5 Units Subcutaneous QHS  . insulin aspart  0-9 Units Subcutaneous TID WC  . insulin glargine  4 Units Subcutaneous QHS  . insulin glargine  6 Units Subcutaneous q morning - 10a  . iron polysaccharides  150 mg Oral BID  . levothyroxine  112 mcg Oral Once per day on Mon Tue Wed Thu Fri Sat  . losartan  25 mg Oral Daily  . sodium chloride flush  3 mL Intravenous Q12H   Continuous Infusions: . sodium chloride    . remdesivir 100 mg in NS 100 mL 100 mg (11/21/19 0849)     LOS: 1 day    Time spent: 25 mins    Shawna Clamp, MD [facility]   If  7PM-7AM, please contact night-coverage

## 2019-11-22 LAB — COMPREHENSIVE METABOLIC PANEL
ALT: 20 U/L (ref 0–44)
AST: 38 U/L (ref 15–41)
Albumin: 2.6 g/dL — ABNORMAL LOW (ref 3.5–5.0)
Alkaline Phosphatase: 56 U/L (ref 38–126)
Anion gap: 10 (ref 5–15)
BUN: 47 mg/dL — ABNORMAL HIGH (ref 8–23)
CO2: 26 mmol/L (ref 22–32)
Calcium: 8.5 mg/dL — ABNORMAL LOW (ref 8.9–10.3)
Chloride: 101 mmol/L (ref 98–111)
Creatinine, Ser: 2.47 mg/dL — ABNORMAL HIGH (ref 0.44–1.00)
GFR calc Af Amer: 21 mL/min — ABNORMAL LOW (ref >60)
GFR calc non Af Amer: 18 mL/min — ABNORMAL LOW (ref >60)
Glucose, Bld: 266 mg/dL — ABNORMAL HIGH (ref 70–99)
Potassium: 4.7 mmol/L (ref 3.5–5.1)
Sodium: 137 mmol/L (ref 135–145)
Total Bilirubin: 0.4 mg/dL (ref 0.3–1.2)
Total Protein: 5.4 g/dL — ABNORMAL LOW (ref 6.5–8.1)

## 2019-11-22 LAB — MAGNESIUM: Magnesium: 2 mg/dL (ref 1.7–2.4)

## 2019-11-22 LAB — C-REACTIVE PROTEIN: CRP: 0.8 mg/dL (ref <1.0)

## 2019-11-22 LAB — CBC
HCT: 27.8 % — ABNORMAL LOW (ref 36.0–46.0)
Hemoglobin: 8.8 g/dL — ABNORMAL LOW (ref 12.0–15.0)
MCH: 28.4 pg (ref 26.0–34.0)
MCHC: 31.7 g/dL (ref 30.0–36.0)
MCV: 89.7 fL (ref 80.0–100.0)
Platelets: 397 10*3/uL (ref 150–400)
RBC: 3.1 MIL/uL — ABNORMAL LOW (ref 3.87–5.11)
RDW: 15.8 % — ABNORMAL HIGH (ref 11.5–15.5)
WBC: 10 10*3/uL (ref 4.0–10.5)
nRBC: 0.2 % (ref 0.0–0.2)

## 2019-11-22 LAB — LACTIC ACID, PLASMA: Lactic Acid, Venous: 1.3 mmol/L (ref 0.5–1.9)

## 2019-11-22 LAB — GLUCOSE, CAPILLARY
Glucose-Capillary: 334 mg/dL — ABNORMAL HIGH (ref 70–99)
Glucose-Capillary: 390 mg/dL — ABNORMAL HIGH (ref 70–99)
Glucose-Capillary: 287 mg/dL — ABNORMAL HIGH (ref 70–99)
Glucose-Capillary: 261 mg/dL — ABNORMAL HIGH (ref 70–99)

## 2019-11-22 LAB — PHOSPHORUS: Phosphorus: 4.4 mg/dL (ref 2.5–4.6)

## 2019-11-22 MED ORDER — HEPARIN SODIUM (PORCINE) 5000 UNIT/ML IJ SOLN: 5000.0000 [IU] | Freq: Three times a day (TID) | INTRAMUSCULAR | Status: DC

## 2019-11-22 MED ORDER — PANTOPRAZOLE SODIUM 40 MG IV SOLR
40.0000 mg | Freq: Two times a day (BID) | INTRAVENOUS | Status: DC
  Administered 2019-11-22 – 2019-11-23 (×4): 40 mg via INTRAVENOUS
  Filled 2019-11-22 (×5): qty 40

## 2019-11-22 NOTE — Progress Notes (Signed)
PROGRESS NOTE    Elizabeth Olsen  GGY:[id number] DOB: [birth year] DOA: 11/20/2019 PCP: [name], MD   Brief Narrative:  Elizabeth Olsen  is a 79 y.o. female,  with Jerrye Bushy, vitamin D deficiency, h/o remote breast cancer,  hypothyroidism, hypertension, hyperlipidemia, DM2, CHF (diastolic), presents with dyspnea x 4 days. Slight cough, chronic, (pt attributes cough to her bp medication), slight weigh gain 10 lbs over the past few months.  Slight leg swelling right > left.   Pt denies fever, chills, cp, palp, n/v, diarrhea, brbpr.   Patient is Covid positive, venous duplex negative for DVT/ VQ scan negative for PE. Her Hb dropped by 1 gm, occult positive stool, GI consulted.  Assessment & Plan:   Active Problems:   Diabetes mellitus (HCC)   Hyperlipidemia   Anemia   Essential hypertension   Dyspnea   Acute diastolic CHF (congestive heart failure) ([place])   COVID-19 virus infection   # Shortness of breath secondary to COVID-19 infection plus possible CHF exacerbation -Elevated D-dimer: Could be an acute phase reactant versus a clot, will get lower extremity duplexes, if unrevealing we will then proceed to a VQ scan, not able to have CTA due to kidney functions. -Continue Dexamethasone -Continue Remdesivir -Daily labs -Venous duplex negative for DVT, VQ scan negative for PE. -Continue supplemental oxygen to maintain oxygen saturation more than 94%   # Acute on chronic diastolic dysfunction Recent echo shows grade 2 diastolic dysfunction apparently echo was repeated here as well even though it was done 1 month ago. -IV Lasix Continue beta-blocker as well as losartan with close monitoring of kidney function  # Acute kidney injury could be dehydration and medication induced. Start gentle hydration, Echo WNL.  Avoid nephrotoxic medications. Consider nephro consult if creatinine continues to rise.  # Hyperglycemia with sugars greater than 500 -Was started on insulin drip, will transition to  subcu insulin and sliding scale  # Hyponatremia -Probably due to volume overload as has improved with IV diuresis  # GI bleeding: Her Hb dropped by 2.0, Stool for occult blood +, Gi consulted, will follow up recommendation. H and H stable.  Protonix BID  DVT prophylaxis: SCds. Code Status: Full Family Communication: No one at bed side, d/w patient in detail. Disposition Plan:  Anticipated discharge home in 1 to 2 days after she completes 5 days of remdesivir and Decadron therapy. Consultants:    None  Procedures:  Antimicrobials:  Anti-infectives (From admission, onward)   Start     Dose/Rate Route Frequency Ordered Stop   11/21/19 1000  remdesivir 100 mg in sodium chloride 0.9 % 100 mL IVPB     100 mg 200 mL/hr over 30 Minutes Intravenous Daily 11/20/19 0503 11/25/19 0959   11/20/19 0515  remdesivir 200 mg in sodium chloride 0.9% 250 mL IVPB     200 mg 580 mL/hr over 30 Minutes Intravenous Once 11/20/19 0500 11/20/19 0350      Subjective: Patient was seen and examined at bedside. She appears more comfortable, she was talking to her daughter over the phone. Denies any difficulty breathing, no overnight events.  Objective: Vitals:   11/22/19 0536 11/22/19 0600 11/22/19 0700 11/22/19 1235  BP:    (!) 141/58  Pulse:  86 79 77  Resp:  (!) 25 17 (!) 23  Temp: 98.4 F (36.9 C)   98.3 F (36.8 C)  TempSrc: Oral   Oral  SpO2:  95% 93% 96%  Weight:      Height:  Intake/Output Summary (Last 24 hours) at 11/22/2019 1438 Last data filed at 11/22/2019 0958 Gross per 24 hour  Intake 446 ml  Output 200 ml  Net 246 ml   Filed Weights   11/20/19 0755 11/22/19 0500  Weight: 70.3 kg 71.5 kg    Examination:  General exam: Appears calm and comfortable  Respiratory system: Clear to auscultation. Respiratory effort normal. Cardiovascular system: S1 & S2 heard, RRR. No JVD, murmurs, rubs, gallops or clicks. No pedal edema. Gastrointestinal system: Abdomen is  nondistended, soft and nontender. No organomegaly or masses felt. Normal bowel sounds heard. Central nervous system: Alert and oriented. No focal neurological deficits. Extremities: Symmetric 5 x 5 power. Skin: No rashes, lesions or ulcers Psychiatry: Judgement and insight appear normal. Mood & affect appropriate.     Data Reviewed: I have personally reviewed following labs and imaging studies  CBC: Recent Labs  Lab 11/18/19 1002 11/18/19 1002 11/20/19 0049 11/20/19 0515 11/21/19 0226 11/21/19 1023 11/22/19 0253  WBC 9.2  --  8.3 7.8 8.9 8.7 10.0  NEUTROABS 6.5  --  6.6  --   --   --   --   HGB 9.2*  --  9.8* 10.6* 8.4* 9.0* 8.8*  HCT 29.2*   < > 31.2* 35.0* 26.6* 29.8* 27.8*  MCV 89.3   < > 91.0 93.6 90.2 91.7 89.7  PLT 313  --  296 321 319 416* 397   < > = values in this interval not displayed.   Basic Metabolic Panel: Recent Labs  Lab 11/20/19 0049 11/20/19 0515 11/21/19 0226 11/22/19 0253  NA 132* 136 136 137  K 4.2 4.2 4.7 4.7  CL 96* 101 102 101  CO2 25 23 25 26   GLUCOSE 515* 235* 228* 266*  BUN 36* 35* 41* 47*  CREATININE 1.73* 1.66* 2.19* 2.47*  CALCIUM 8.8* 9.0 8.4* 8.5*  MG  --   --   --  2.0  PHOS  --   --   --  4.4   GFR: Estimated Creatinine Clearance: 18.1 mL/min (A) (by C-G formula based on SCr of 2.47 mg/dL (H)). Liver Function Tests: Recent Labs  Lab 11/20/19 0049 11/20/19 0515 11/21/19 0226 11/22/19 0253  AST 44* 42* 30 38  ALT 24 23 19 20   ALKPHOS 68 65 53 56  BILITOT 1.1 0.8 0.4 0.4  PROT 6.8 7.1 5.2* 5.4*  ALBUMIN 3.2* 3.4* 2.5* 2.6*   No results for input(s): LIPASE, AMYLASE in the last 168 hours. No results for input(s): AMMONIA in the last 168 hours. Coagulation Profile: No results for input(s): INR, PROTIME in the last 168 hours. Cardiac Enzymes: Recent Labs  Lab 11/20/19 0515  CKTOTAL 345*  CKMB 6.1*   BNP (last 3 results) Recent Labs    08/18/19 1035  PROBNP 1,864*   HbA1C: Recent Labs    11/20/19 1038    HGBA1C 9.0*   CBG: Recent Labs  Lab 11/21/19 1300 11/21/19 1833 11/21/19 2129 11/22/19 0754 11/22/19 1148  GLUCAP 320* 294* 229* 334* 390*   Lipid Profile: No results for input(s): CHOL, HDL, LDLCALC, TRIG, CHOLHDL, LDLDIRECT in the last 72 hours. Thyroid Function Tests: No results for input(s): TSH, T4TOTAL, FREET4, T3FREE, THYROIDAB in the last 72 hours. Anemia Panel: No results for input(s): VITAMINB12, FOLATE, FERRITIN, TIBC, IRON, RETICCTPCT in the last 72 hours. Sepsis Labs: Recent Labs  Lab 11/20/19 0515 11/22/19 0253  LATICACIDVEN 2.2* 1.3    Recent Results (from the past 240 hour(s))  Respiratory Panel by  RT PCR (Flu A&B, Covid) - Nasopharyngeal Swab     Status: Abnormal   Collection Time: 11/20/19  2:43 AM   Specimen: Nasopharyngeal Swab  Result Value Ref Range Status   SARS Coronavirus 2 by RT PCR POSITIVE (A) NEGATIVE Final    Comment: RESULT CALLED TO, READ BACK BY AND VERIFIED WITH: L VENEGAS RN 11/20/19 0401  JDW (NOTE) SARS-CoV-2 target nucleic acids are DETECTED. SARS-CoV-2 RNA is generally detectable in upper respiratory specimens  during the acute phase of infection. Positive results are indicative of the presence of the identified virus, but do not rule out bacterial infection or co-infection with other pathogens not detected by the test. Clinical correlation with patient history and other diagnostic information is necessary to determine patient infection status. The expected result is Negative. Fact Sheet for Patients:  PinkCheek.be Fact Sheet for Healthcare Providers: GravelBags.it This test is not yet approved or cleared by the [place] FDA and  has been authorized for detection and/or diagnosis of SARS-CoV-2 by FDA under an Emergency Use Authorization (EUA).  This EUA will remain in effect (meaning this test can be used) for th e duration of  the COVID-19 declaration under Section  564(b)(1) of the Act, 21 U.S.C. section 360bbb-3(b)(1), unless the authorization is terminated or revoked sooner.    Influenza A by PCR NEGATIVE NEGATIVE Final   Influenza B by PCR NEGATIVE NEGATIVE Final    Comment: (NOTE) The Xpert Xpress SARS-CoV-2/FLU/RSV assay is intended as an aid in  the diagnosis of influenza from Nasopharyngeal swab specimens and  should not be used as a sole basis for treatment. Nasal washings and  aspirates are unacceptable for Xpert Xpress SARS-CoV-2/FLU/RSV  testing. Fact Sheet for Patients: PinkCheek.be Fact Sheet for Healthcare Providers: GravelBags.it This test is not yet approved or cleared by the [place] FDA and  has been authorized for detection and/or diagnosis of SARS-CoV-2 by  FDA under an Emergency Use Authorization (EUA). This EUA will remain  in effect (meaning this test can be used) for the duration of the  Covid-19 declaration under Section 564(b)(1) of the Act, 21  U.S.C. section 360bbb-3(b)(1), unless the authorization is  terminated or revoked. Performed at [facility], [place] [address] [place], [place] [address]      Radiology Studies: NM Pulmonary Perfusion  Result Date: 11/21/2019 CLINICAL DATA:  COVID positive, shortness of breath EXAM: NUCLEAR MEDICINE PERFUSION LUNG SCAN TECHNIQUE: Perfusion images were obtained in multiple projections after intravenous injection of radiopharmaceutical. Ventilation scans intentionally deferred if perfusion scan and chest x-ray adequate for interpretation during COVID 19 epidemic. RADIOPHARMACEUTICALS:  1.5 mCi Tc-28m MAA IV COMPARISON:  None. FINDINGS: No significant perfusion defects of bilateral lungs. IMPRESSION: No scintigraphic evidence of pulmonary embolus. Electronically Signed   By: Kathreen Devoid   On: 11/21/2019 13:04   DG Chest Port 1 View  Result Date: 11/21/2019 CLINICAL DATA:  Preprocedure examination. Dyspnea. Lower  extremity edema. EXAM: PORTABLE CHEST 1 VIEW COMPARISON:  Chest x-rays dated 11/20/2019 and 09/04/2018 FINDINGS: There is cardiomegaly and slight prominence of the main pulmonary arteries even considering the AP portable technique. There are no infiltrates or effusions. No acute bone abnormality. The small nodular density seen over the right upper lobe on the prior study are not apparent on this exam. The aeration has improved both lung bases. IMPRESSION: No acute abnormality. Improved aeration at the lung bases. Persistent cardiomegaly and prominence of the main pulmonary arteries. Aortic Atherosclerosis (ICD10-I70.0). Electronically Signed   By:  Lorriane Shire M.D.   On: 11/21/2019 10:59    Scheduled Meds:  amLODipine  5 mg Oral Daily   atorvastatin  10 mg Oral Daily   bacitracin   Topical BID   carvedilol  6.25 mg Oral BID   dexamethasone (DECADRON) injection  6 mg Intravenous Q0600   fenofibrate  160 mg Oral Daily   furosemide  40 mg Intravenous Daily   [START ON 11/23/2019] heparin injection (subcutaneous)  5,000 Units Subcutaneous Q8H   insulin aspart  0-5 Units Subcutaneous QHS   insulin aspart  0-9 Units Subcutaneous TID WC   insulin glargine  4 Units Subcutaneous QHS   insulin glargine  6 Units Subcutaneous q morning - 10a   iron polysaccharides  150 mg Oral BID   levothyroxine  112 mcg Oral Once per day on Mon Tue Wed Thu Fri Sat   sodium chloride flush  3 mL Intravenous Q12H   Continuous Infusions:  sodium chloride     remdesivir 100 mg in NS 100 mL 100 mg (11/22/19 1008)     LOS: 2 days    Time spent: 25 mins    Elizabeth Ognibene, MD [facility]   If 7PM-7AM, please contact night-coverage

## 2019-11-22 NOTE — Consult Note (Addendum)
Cross cover [facility] Reason for Consult: Guaiac positive stool with anemia. Referring Physician: THP.  Elizabeth Olsen is an 79 y.o. female.  HPI: Elizabeth Olsen, is a 79 year old female with multiple medical problems listed below who was admitted to the Covid unit with a history of shortness of breath cough and slight weight gain over the last 10 months she had some leg swelling right more than left and tested Covid positive on admission to the ER.  She was found to have 1.2 g drop in her hemoglobin with guaiac positive stools but as per my discussion with her nurse there is been no history of melena hematochezia or any other GI active GI complaints at this time.  A limited consultation was requested by Dr. Shanda Howells. Because of patient's Covid situation and the fact that no GI work-up would be undertaken at this time due to lack of acute symptoms a consultation has been done without actually questioning the patient.  The details of the history been procured from the patient's nurse and from review of her chart.   Past Medical History:  Diagnosis Date  . Anemia   . Blood transfusion   . Blood transfusion without reported diagnosis    with breast surgery  . Breast cancer ([place]) 15 years ago   left   . CHF (congestive heart failure) ([place])   . 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 Diabetes mellitus    dx 1998.  was told prior to getting chemo that her bld sugar rose.  She thought it would go back  . Dyspnea    d/t anemia  . GERD (gastroesophageal reflux disease)   . Heart murmur    as child  . Hernia, incisional   . Hyperlipidemia   . Hypertension   . Hypothyroidism   . Vitamin D deficiency    Past Surgical History:  Procedure Laterality Date  . ABDOMINAL HYSTERECTOMY    . APPENDECTOMY    . BREAST SURGERY    . CHOLECYSTECTOMY  01/22/2018   LAPROSCOPIC   . CHOLECYSTECTOMY N/A 01/22/2018   Procedure: LAPAROSCOPIC CHOLECYSTECTOMY WITH INTRAOPERATIVE CHOLANGIOGRAM;  Surgeon: Donnie Mesa, MD;  Location: [facility];  Service: General;  Laterality: N/A;  . COLONOSCOPY  2013   due next 03-2017  . EYE SURGERY     bil cataracts  . IUD REMOVAL     with appendectomy  . MASTECTOMY, MODIFIED RADICAL W/RECONSTRUCTION Left 15 years ago   10 nodes out  . TUMOR EXCISION Left    x 2, neck, head    Family History  Problem Relation Age of Onset  . Diabetes Other        both sides of family  . Hypertension Father   . Congestive Heart Failure Father   . Peripheral vascular disease Father   . Hypertension Maternal Grandfather   . Hypertension Maternal Grandmother   . Stomach cancer Maternal Grandmother        GGM  . Heart attack Brother   . Colon cancer Neg Hx   . Esophageal cancer Neg Hx   . Pancreatic cancer Neg Hx   . Rectal cancer Neg Hx    Social History:  reports that she has never smoked. She has never used smokeless tobacco. She reports that she does not drink alcohol or use drugs.  Allergies:  Allergies  Allergen Reactions  . Erythromycin Shortness Of Breath and Swelling    "throat swelling", sob, thrashing ,   . Penicillins Rash  Has patient had a PCN reaction causing immediate rash, facial/tongue/throat swelling, SOB or lightheadedness with hypotension: Unknown Has patient had a PCN reaction causing severe rash involving mucus membranes or skin necrosis: No Has patient had a PCN reaction that required hospitalization: No Has patient had a PCN reaction occurring within the last 10 years: No If all of the above answers are "NO", then may proceed with Cephalosporin use.   Marland Kitchen Nyquil [Pseudoeph-Doxylamine-Dm-Apap] Rash  . Sulfa Antibiotics Rash   Medications: I have reviewed the patient's current medications.  Results for orders placed or performed during the hospital encounter of 11/20/19 (from the past 48 hour(s))  CBG monitoring, ED     Status: Abnormal   Collection Time: 11/20/19  4:58 PM  Result Value Ref Range   Glucose-Capillary 365 (H) 70 - 99 mg/dL    Comment 1 Notify RN   CBG monitoring, ED     Status: Abnormal   Collection Time: 11/20/19  7:20 PM  Result Value Ref Range   Glucose-Capillary 294 (H) 70 - 99 mg/dL  CBG monitoring, ED     Status: Abnormal   Collection Time: 11/20/19  9:20 PM  Result Value Ref Range   Glucose-Capillary 257 (H) 70 - 99 mg/dL  C-reactive protein     Status: None   Collection Time: 11/21/19  2:26 AM  Result Value Ref Range   CRP 0.8 <1.0 mg/dL    Comment: Performed at [facility], [place] [address] [place], [place] [address]  CBC     Status: Abnormal   Collection Time: 11/21/19  2:26 AM  Result Value Ref Range   WBC 8.9 4.0 - 10.5 K/uL   RBC 2.95 (L) 3.87 - 5.11 MIL/uL   Hemoglobin 8.4 (L) 12.0 - 15.0 g/dL    Comment: REPEATED TO VERIFY   HCT 26.6 (L) 36.0 - 46.0 %   MCV 90.2 80.0 - 100.0 fL   MCH 28.5 26.0 - 34.0 pg   MCHC 31.6 30.0 - 36.0 g/dL   RDW 15.8 (H) 11.5 - 15.5 %   Platelets 319 150 - 400 K/uL   nRBC 0.2 0.0 - 0.2 %    Comment: Performed at [facility], [place] [address] [place], [place] [address]  Comprehensive metabolic panel     Status: Abnormal   Collection Time: 11/21/19  2:26 AM  Result Value Ref Range   Sodium 136 135 - 145 mmol/L   Potassium 4.7 3.5 - 5.1 mmol/L   Chloride 102 98 - 111 mmol/L   CO2 25 22 - 32 mmol/L   Glucose, Bld 228 (H) 70 - 99 mg/dL   BUN 41 (H) 8 - 23 mg/dL   Creatinine, Ser 2.19 (H) 0.44 - 1.00 mg/dL   Calcium 8.4 (L) 8.9 - 10.3 mg/dL   Total Protein 5.2 (L) 6.5 - 8.1 g/dL   Albumin 2.5 (L) 3.5 - 5.0 g/dL   AST 30 15 - 41 U/L   ALT 19 0 - 44 U/L   Alkaline Phosphatase 53 38 - 126 U/L   Total Bilirubin 0.4 0.3 - 1.2 mg/dL   GFR calc non Af Amer 21 (L) >60 mL/min   GFR calc Af Amer 24 (L) >60 mL/min   Anion gap 9 5 - 15    Comment: Performed at [facility], [place] [address] [place], [place] [address]  Glucose, capillary     Status: Abnormal   Collection Time: 11/21/19  8:30 AM  Result Value Ref  Range   Glucose-Capillary 355  (H) 70 - 99 mg/dL  CBC     Status: Abnormal   Collection Time: 11/21/19 10:23 AM  Result Value Ref Range   WBC 8.7 4.0 - 10.5 K/uL   RBC 3.25 (L) 3.87 - 5.11 MIL/uL   Hemoglobin 9.0 (L) 12.0 - 15.0 g/dL   HCT 29.8 (L) 36.0 - 46.0 %   MCV 91.7 80.0 - 100.0 fL   MCH 27.7 26.0 - 34.0 pg   MCHC 30.2 30.0 - 36.0 g/dL   RDW 15.9 (H) 11.5 - 15.5 %   Platelets 416 (H) 150 - 400 K/uL   nRBC 0.0 0.0 - 0.2 %    Comment: Performed at [place] [address] [place], [place] [address]  Glucose, capillary     Status: Abnormal   Collection Time: 11/21/19  1:00 PM  Result Value Ref Range   Glucose-Capillary 320 (H) 70 - 99 mg/dL  Glucose, capillary     Status: Abnormal   Collection Time: 11/21/19  6:33 PM  Result Value Ref Range   Glucose-Capillary 294 (H) 70 - 99 mg/dL  Occult blood card to lab, stool     Status: Abnormal   Collection Time: 11/21/19  7:00 PM  Result Value Ref Range   Fecal Occult Bld POSITIVE (A) NEGATIVE    Comment: Performed at [facility], [place] [address], [place], [place] [address]  Glucose, capillary     Status: Abnormal   Collection Time: 11/21/19  9:29 PM  Result Value Ref Range   Glucose-Capillary 229 (H) 70 - 99 mg/dL  C-reactive protein     Status: None   Collection Time: 11/22/19  2:53 AM  Result Value Ref Range   CRP 0.8 <1.0 mg/dL    Comment: Performed at [facility], [place] [address], [place], [place] [address]  CBC     Status: Abnormal   Collection Time: 11/22/19  2:53 AM  Result Value Ref Range   WBC 10.0 4.0 - 10.5 K/uL   RBC 3.10 (L) 3.87 - 5.11 MIL/uL   Hemoglobin 8.8 (L) 12.0 - 15.0 g/dL   HCT 27.8 (L) 36.0 - 46.0 %   MCV 89.7 80.0 - 100.0 fL   MCH 28.4 26.0 - 34.0 pg   MCHC 31.7 30.0 - 36.0 g/dL   RDW 15.8 (H) 11.5 - 15.5 %   Platelets 397 150 - 400 K/uL   nRBC 0.2 0.0 - 0.2 %    Comment: Performed at [facility], [place] [address] [place], [place] [address]  Comprehensive metabolic panel     Status: Abnormal    Collection Time: 11/22/19  2:53 AM  Result Value Ref Range   Sodium 137 135 - 145 mmol/L   Potassium 4.7 3.5 - 5.1 mmol/L   Chloride 101 98 - 111 mmol/L   CO2 26 22 - 32 mmol/L   Glucose, Bld 266 (H) 70 - 99 mg/dL   BUN 47 (H) 8 - 23 mg/dL   Creatinine, Ser 2.47 (H) 0.44 - 1.00 mg/dL   Calcium 8.5 (L) 8.9 - 10.3 mg/dL   Total Protein 5.4 (L) 6.5 - 8.1 g/dL   Albumin 2.6 (L) 3.5 - 5.0 g/dL   AST 38 15 - 41 U/L   ALT 20 0 - 44 U/L   Alkaline Phosphatase 56 38 - 126 U/L   Total Bilirubin 0.4 0.3 - 1.2 mg/dL   GFR calc non Af Amer 18 (L) >  60 mL/min   GFR calc Af Amer 21 (L) >60 mL/min   Anion gap 10 5 - 15    Comment: Performed at [place] [address], [place], [place] [address]  Magnesium     Status: None   Collection Time: 11/22/19  2:53 AM  Result Value Ref Range   Magnesium 2.0 1.7 - 2.4 mg/dL    Comment: Performed at [facility], [place] [address], [place], [place] [address]  Phosphorus     Status: None   Collection Time: 11/22/19  2:53 AM  Result Value Ref Range   Phosphorus 4.4 2.5 - 4.6 mg/dL    Comment: Performed at [facility], [place] [address] [place], [place] [address]  Lactic acid, plasma     Status: None   Collection Time: 11/22/19  2:53 AM  Result Value Ref Range   Lactic Acid, Venous 1.3 0.5 - 1.9 mmol/L    Comment: Performed at [facility] [address], [place], [place] [address]  Glucose, capillary     Status: Abnormal   Collection Time: 11/22/19  7:54 AM  Result Value Ref Range   Glucose-Capillary 334 (H) 70 - 99 mg/dL  Glucose, capillary     Status: Abnormal   Collection Time: 11/22/19 11:48 AM  Result Value Ref Range   Glucose-Capillary 390 (H) 70 - 99 mg/dL    NM Pulmonary Perfusion  Result Date: 11/21/2019 CLINICAL DATA:  COVID positive, shortness of breath EXAM: NUCLEAR MEDICINE PERFUSION LUNG SCAN TECHNIQUE: Perfusion images were obtained in multiple projections after intravenous injection of radiopharmaceutical.  Ventilation scans intentionally deferred if perfusion scan and chest x-ray adequate for interpretation during COVID 19 epidemic. RADIOPHARMACEUTICALS:  1.5 mCi Tc-2m MAA IV COMPARISON:  None. FINDINGS: No significant perfusion defects of bilateral lungs. IMPRESSION: No scintigraphic evidence of pulmonary embolus. Electronically Signed   By: Kathreen Devoid   On: 11/21/2019 13:04   DG Chest Port 1 View  Result Date: 11/21/2019 CLINICAL DATA:  Preprocedure examination. Dyspnea. Lower extremity edema. EXAM: PORTABLE CHEST 1 VIEW COMPARISON:  Chest x-rays dated 11/20/2019 and 09/04/2018 FINDINGS: There is cardiomegaly and slight prominence of the main pulmonary arteries even considering the AP portable technique. There are no infiltrates or effusions. No acute bone abnormality. The small nodular density seen over the right upper lobe on the prior study are not apparent on this exam. The aeration has improved both lung bases. IMPRESSION: No acute abnormality. Improved aeration at the lung bases. Persistent cardiomegaly and prominence of the main pulmonary arteries. Aortic Atherosclerosis (ICD10-I70.0). Electronically Signed   By: Lorriane Shire M.D.   On: 11/21/2019 10:59   Review of Systems  Unable to perform ROS: Other   Blood pressure (!) 141/58, pulse 77, temperature 98.3 F (36.8 C), temperature source Oral, resp. rate (!) 23, height 5\' 3"  (1.6 m), weight 71.5 kg, SpO2 96 %. Physical Exam  Vitals reviewed. Constitutional:  Physical exam was not done as per my discussion with Dr. Dwyane Dee as the patient was admitted to the Covid unit and was there were no plans to do an acute GI intervention at this time   Assessment/Plan: 1) Guaiac positive stools 1.2 g drop in hemoglobin with no acute symptoms at this time.  I would recommend close follow-up and and intervention will be taken if patient develops any acute problems like melena or hematochezia.Marland Kitchen 2) shortness of breath with Covid infection and CHF  exacerbation patient not on Remdesivir and dexamethasone.  3) Acute kidney injury/HTN/Hyperlipidemia. 4) Hyponatremia/Hypoglycemia. 5) Acute on chronic diastolic dysfunction. 6) Remote history of breast cancer.   Juanita Craver 11/22/2019, function 4:05 PM

## 2019-11-23 LAB — GLUCOSE, CAPILLARY
Glucose-Capillary: 325 mg/dL — ABNORMAL HIGH (ref 70–99)
Glucose-Capillary: 370 mg/dL — ABNORMAL HIGH (ref 70–99)
Glucose-Capillary: 300 mg/dL — ABNORMAL HIGH (ref 70–99)
Glucose-Capillary: 230 mg/dL — ABNORMAL HIGH (ref 70–99)

## 2019-11-23 LAB — CBC
HCT: 27 % — ABNORMAL LOW (ref 36.0–46.0)
Hemoglobin: 8.5 g/dL — ABNORMAL LOW (ref 12.0–15.0)
MCH: 28.2 pg (ref 26.0–34.0)
MCHC: 31.5 g/dL (ref 30.0–36.0)
MCV: 89.7 fL (ref 80.0–100.0)
Platelets: 420 10*3/uL — ABNORMAL HIGH (ref 150–400)
RBC: 3.01 MIL/uL — ABNORMAL LOW (ref 3.87–5.11)
RDW: 15.9 % — ABNORMAL HIGH (ref 11.5–15.5)
WBC: 9.3 10*3/uL (ref 4.0–10.5)
nRBC: 0.2 % (ref 0.0–0.2)

## 2019-11-23 LAB — COMPREHENSIVE METABOLIC PANEL
ALT: 20 U/L (ref 0–44)
AST: 34 U/L (ref 15–41)
Albumin: 2.4 g/dL — ABNORMAL LOW (ref 3.5–5.0)
Alkaline Phosphatase: 51 U/L (ref 38–126)
Anion gap: 12 (ref 5–15)
BUN: 61 mg/dL — ABNORMAL HIGH (ref 8–23)
CO2: 25 mmol/L (ref 22–32)
Calcium: 8.3 mg/dL — ABNORMAL LOW (ref 8.9–10.3)
Chloride: 98 mmol/L (ref 98–111)
Creatinine, Ser: 2.89 mg/dL — ABNORMAL HIGH (ref 0.44–1.00)
GFR calc Af Amer: 17 mL/min — ABNORMAL LOW (ref >60)
GFR calc non Af Amer: 15 mL/min — ABNORMAL LOW (ref >60)
Glucose, Bld: 235 mg/dL — ABNORMAL HIGH (ref 70–99)
Potassium: 4.5 mmol/L (ref 3.5–5.1)
Sodium: 135 mmol/L (ref 135–145)
Total Bilirubin: 0.4 mg/dL (ref 0.3–1.2)
Total Protein: 5.3 g/dL — ABNORMAL LOW (ref 6.5–8.1)

## 2019-11-23 LAB — PHOSPHORUS: Phosphorus: 4.5 mg/dL (ref 2.5–4.6)

## 2019-11-23 LAB — HEMOGLOBIN AND HEMATOCRIT, BLOOD
HCT: 29.2 % — ABNORMAL LOW (ref 36.0–46.0)
Hemoglobin: 9.4 g/dL — ABNORMAL LOW (ref 12.0–15.0)

## 2019-11-23 LAB — C-REACTIVE PROTEIN: CRP: 0.9 mg/dL (ref <1.0)

## 2019-11-23 LAB — MAGNESIUM: Magnesium: 2 mg/dL (ref 1.7–2.4)

## 2019-11-23 MED ORDER — INSULIN ASPART 100 UNIT/ML ~~LOC~~ SOLN
3.0000 [IU] | Freq: Three times a day (TID) | SUBCUTANEOUS | Status: DC
  Administered 2019-11-23 – 2019-11-24 (×3): 3 [IU] via SUBCUTANEOUS

## 2019-11-23 MED ORDER — INSULIN ASPART 100 UNIT/ML ~~LOC~~ SOLN
0.0000 [IU] | Freq: Three times a day (TID) | SUBCUTANEOUS | Status: DC
  Administered 2019-11-23 – 2019-11-24 (×2): 8 [IU] via SUBCUTANEOUS
  Administered 2019-11-24: 11:00:00 15 [IU] via SUBCUTANEOUS
  Administered 2019-11-24 – 2019-11-25 (×2): 11 [IU] via SUBCUTANEOUS
  Administered 2019-11-25: 12:00:00 3 [IU] via SUBCUTANEOUS
  Administered 2019-11-25: 18:00:00 5 [IU] via SUBCUTANEOUS
  Administered 2019-11-26 (×2): 2 [IU] via SUBCUTANEOUS
  Administered 2019-11-27: 08:00:00 15 [IU] via SUBCUTANEOUS
  Administered 2019-11-27: 13:00:00 8 [IU] via SUBCUTANEOUS

## 2019-11-23 NOTE — Progress Notes (Signed)
PROGRESS NOTE    Elizabeth Olsen  VOZ:[id number] DOB: 06/01/1941 DOA: 11/20/2019 PCP: [name], MD   Brief Narrative:  Elizabeth Olsen  is a 79 y.o. female,  with Elizabeth Olsen, vitamin D deficiency, h/o remote breast cancer,  hypothyroidism, hypertension, hyperlipidemia, DM2, CHF (diastolic), presents with dyspnea x 4 days. Slight cough, chronic, (pt attributes cough to her bp medication), slight weigh gain 10 lbs over the past few months.  Slight leg swelling right > left.   Pt denies fever, chills, cp, palp, n/v, diarrhea, brbpr.   Patient is Covid positive, venous duplex negative for DVT/ VQ scan negative for PE. Her Hb dropped by 1.2 gm, occult positive stools, GI consulted, recommend. No EGD/ colonoscopy unless patient symptomatic( Hematemesis, Melena). She developed AKI, lasix discontinued, consider nephrology if serum creatinine continues to rise.  Assessment & Plan:   Active Problems:   Diabetes mellitus (HCC)   Hyperlipidemia   Anemia   Essential hypertension   Dyspnea   Acute diastolic CHF (congestive heart failure) ([place])   COVID-19 virus infection   # Shortness of breath secondary to COVID-19 infection plus possible CHF exacerbation -Elevated D-dimer: Could be an acute phase reactant versus a clot, will get lower extremity duplexe, if unrevealing we will then proceed to a VQ scan, not able to have CTA due to kidney functions. -Continue Dexamethasone -Continue Remdesivir -Daily labs -Venous duplex negative for DVT, VQ scan negative for PE. -Continue supplemental oxygen to maintain oxygen saturation more than 94%   # Acute on chronic diastolic dysfunction Recent echo shows grade 2 diastolic dysfunction apparently echo was repeated here as well even though it was done 1 month ago. - Lasix and losartan on hold due to worsening renal functions.  Continue beta-blocker as well as losartan with close monitoring of kidney function.  # Acute kidney injury could be dehydration and  medication induced. continue gentle hydration, Echo WNL.  Avoid nephrotoxic medications. Consider nephro consult if creatinine continues to rise. Baseline creatinine 1.4-1.5.  # Hyperglycemia with sugars greater than 500 - started on insulin drip,  transitioned to subcu insulin and sliding scale  # Hyponatremia -Probably due to volume overload as has improved with IV diuresis.  # GI bleeding: Her Hb dropped by 1.2, Stool for occult blood +, Gi consulted, recommend no EGD/colonoscopy unless symptomatic (hematemesis, melena) H and H stable.  Protonix BID  DVT prophylaxis: SCds. Code Status: Full Family Communication: No one at bed side, d/w patient in detail. Disposition Plan:  Anticipated discharge home in 1 to 2 days after she completes 5 days of remdesivir and Decadron therapy. Consultants:    None  Procedures:  Antimicrobials:  Anti-infectives (From admission, onward)   Start     Dose/Rate Route Frequency Ordered Stop   11/21/19 1000  remdesivir 100 mg in sodium chloride 0.9 % 100 mL IVPB     100 mg 200 mL/hr over 30 Minutes Intravenous Daily 11/20/19 0503 11/25/19 0959   11/20/19 0515  remdesivir 200 mg in sodium chloride 0.9% 250 mL IVPB     200 mg 580 mL/hr over 30 Minutes Intravenous Once 11/20/19 0500 11/20/19 4259      Subjective: Patient was seen and examined at bedside. She appears more comfortable,Denies any difficulty breathing, no overnight events.  She denies any blood in the stools.  Objective: Vitals:   11/23/19 0520 11/23/19 0522 11/23/19 0800 11/23/19 0849  BP: (!) 156/58   (!) 162/64  Pulse: 76  79   Resp: 11  20  Temp: 97.7 F (36.5 C)     TempSrc: Oral     SpO2: 97%  95%   Weight:  72.5 kg    Height:        Intake/Output Summary (Last 24 hours) at 11/23/2019 1352 Last data filed at 11/23/2019 1200 Gross per 24 hour  Intake 980.94 ml  Output 600 ml  Net 380.94 ml   Filed Weights   11/20/19 0755 11/22/19 0500 11/23/19 0522  Weight:  70.3 kg 71.5 kg 72.5 kg    Examination:  General exam: Appears calm and comfortable  Respiratory system: Clear to auscultation. Respiratory effort normal. Cardiovascular system: S1 & S2 heard, RRR. No JVD, murmurs, rubs, gallops or clicks. No pedal edema. Gastrointestinal system: Abdomen is nondistended, soft and nontender. No organomegaly or masses felt. Normal bowel sounds heard. Central nervous system: Alert and oriented. No focal neurological deficits. Extremities:  No swelling, no leg pain Skin: No rashes, lesions or ulcers Psychiatry: Judgement and insight appear normal. Mood & affect appropriate.     Data Reviewed: I have personally reviewed following labs and imaging studies  CBC: Recent Labs  Lab 11/18/19 1002 11/18/19 1002 11/20/19 0049 11/20/19 0049 11/20/19 0515 11/21/19 0226 11/21/19 1023 11/22/19 0253 11/23/19 0229  WBC 9.2  --  8.3   < > 7.8 8.9 8.7 10.0 9.3  NEUTROABS 6.5  --  6.6  --   --   --   --   --   --   HGB 9.2*  --  9.8*   < > 10.6* 8.4* 9.0* 8.8* 8.5*  HCT 29.2*   < > 31.2*   < > 35.0* 26.6* 29.8* 27.8* 27.0*  MCV 89.3   < > 91.0   < > 93.6 90.2 91.7 89.7 89.7  PLT 313  --  296   < > 321 319 416* 397 420*   < > = values in this interval not displayed.   Basic Metabolic Panel: Recent Labs  Lab 11/20/19 0049 11/20/19 0515 11/21/19 0226 11/22/19 0253 11/23/19 0229  NA 132* 136 136 137 135  K 4.2 4.2 4.7 4.7 4.5  CL 96* 101 102 101 98  CO2 25 23 25 26 25   GLUCOSE 515* 235* 228* 266* 235*  BUN 36* 35* 41* 47* 61*  CREATININE 1.73* 1.66* 2.19* 2.47* 2.89*  CALCIUM 8.8* 9.0 8.4* 8.5* 8.3*  MG  --   --   --  2.0 2.0  PHOS  --   --   --  4.4 4.5   GFR: Estimated Creatinine Clearance: 15.5 mL/min (A) (by C-G formula based on SCr of 2.89 mg/dL (H)). Liver Function Tests: Recent Labs  Lab 11/20/19 0049 11/20/19 0515 11/21/19 0226 11/22/19 0253 11/23/19 0229  AST 44* 42* 30 38 34  ALT 24 23 19 20 20   ALKPHOS 68 65 53 56 51  BILITOT  1.1 0.8 0.4 0.4 0.4  PROT 6.8 7.1 5.2* 5.4* 5.3*  ALBUMIN 3.2* 3.4* 2.5* 2.6* 2.4*   No results for input(s): LIPASE, AMYLASE in the last 168 hours. No results for input(s): AMMONIA in the last 168 hours. Coagulation Profile: No results for input(s): INR, PROTIME in the last 168 hours. Cardiac Enzymes: Recent Labs  Lab 11/20/19 0515  CKTOTAL 345*  CKMB 6.1*   BNP (last 3 results) Recent Labs    08/18/19 1035  PROBNP 1,864*   HbA1C: No results for input(s): HGBA1C in the last 72 hours. CBG: Recent Labs  Lab 11/22/19 1148 11/22/19 1700 11/22/19  2236 11/23/19 0759 11/23/19 1155  GLUCAP 390* 287* 261* 325* 370*   Lipid Profile: No results for input(s): CHOL, HDL, LDLCALC, TRIG, CHOLHDL, LDLDIRECT in the last 72 hours. Thyroid Function Tests: No results for input(s): TSH, T4TOTAL, FREET4, T3FREE, THYROIDAB in the last 72 hours. Anemia Panel: No results for input(s): VITAMINB12, FOLATE, FERRITIN, TIBC, IRON, RETICCTPCT in the last 72 hours. Sepsis Labs: Recent Labs  Lab 11/20/19 0515 11/22/19 0253  LATICACIDVEN 2.2* 1.3    Recent Results (from the past 240 hour(s))  Respiratory Panel by RT PCR (Flu A&B, Covid) - Nasopharyngeal Swab     Status: Abnormal   Collection Time: 11/20/19  2:43 AM   Specimen: Nasopharyngeal Swab  Result Value Ref Range Status   SARS Coronavirus 2 by RT PCR POSITIVE (A) NEGATIVE Final    Comment: RESULT CALLED TO, READ BACK BY AND VERIFIED WITH: L VENEGAS RN 11/20/19 0401  JDW (NOTE) SARS-CoV-2 target nucleic acids are DETECTED. SARS-CoV-2 RNA is generally detectable in upper respiratory specimens  during the acute phase of infection. Positive results are indicative of the presence of the identified virus, but do not rule out bacterial infection or co-infection with other pathogens not detected by the test. Clinical correlation with patient history and other diagnostic information is necessary to determine patient infection status. The  expected result is Negative. Fact Sheet for Patients:  PinkCheek.be Fact Sheet for Healthcare Providers: GravelBags.it This test is not yet approved or cleared by the [place] FDA and  has been authorized for detection and/or diagnosis of SARS-CoV-2 by FDA under an Emergency Use Authorization (EUA).  This EUA will remain in effect (meaning this test can be used) for th e duration of  the COVID-19 declaration under Section 564(b)(1) of the Act, 21 U.S.C. section 360bbb-3(b)(1), unless the authorization is terminated or revoked sooner.    Influenza A by PCR NEGATIVE NEGATIVE Final   Influenza B by PCR NEGATIVE NEGATIVE Final    Comment: (NOTE) The Xpert Xpress SARS-CoV-2/FLU/RSV assay is intended as an aid in  the diagnosis of influenza from Nasopharyngeal swab specimens and  should not be used as a sole basis for treatment. Nasal washings and  aspirates are unacceptable for Xpert Xpress SARS-CoV-2/FLU/RSV  testing. Fact Sheet for Patients: PinkCheek.be Fact Sheet for Healthcare Providers: GravelBags.it This test is not yet approved or cleared by the [place] FDA and  has been authorized for detection and/or diagnosis of SARS-CoV-2 by  FDA under an Emergency Use Authorization (EUA). This EUA will remain  in effect (meaning this test can be used) for the duration of the  Covid-19 declaration under Section 564(b)(1) of the Act, 21  U.S.C. section 360bbb-3(b)(1), unless the authorization is  terminated or revoked. Performed at [facility], [place] [address] [place], [place] [address]      Radiology Studies: No results found.  Scheduled Meds: . amLODipine  5 mg Oral Daily  . atorvastatin  10 mg Oral Daily  . bacitracin   Topical BID  . carvedilol  6.25 mg Oral BID  . dexamethasone (DECADRON) injection  6 mg Intravenous Q0600  . fenofibrate  160 mg  Oral Daily  . insulin aspart  0-15 Units Subcutaneous TID WC  . insulin aspart  0-5 Units Subcutaneous QHS  . insulin aspart  3 Units Subcutaneous TID WC  . insulin glargine  4 Units Subcutaneous QHS  . insulin glargine  6 Units Subcutaneous q morning - 10a  . iron polysaccharides  150 mg  Oral BID  . levothyroxine  112 mcg Oral Once per day on Mon Tue Wed Thu Fri Sat  . pantoprazole (PROTONIX) IV  40 mg Intravenous Q12H  . sodium chloride flush  3 mL Intravenous Q12H   Continuous Infusions: . sodium chloride    . remdesivir 100 mg in NS 100 mL Stopped (11/23/19 0932)     LOS: 3 days    Time spent: 25 mins    Daiana Vitiello, MD [facility]   If 7PM-7AM, please contact night-coverage

## 2019-11-24 DIAGNOSIS — D649 Anemia, unspecified: Secondary | ICD-10-CM

## 2019-11-24 LAB — COMPREHENSIVE METABOLIC PANEL
ALT: 21 U/L (ref 0–44)
AST: 36 U/L (ref 15–41)
Albumin: 2.5 g/dL — ABNORMAL LOW (ref 3.5–5.0)
Alkaline Phosphatase: 54 U/L (ref 38–126)
Anion gap: 13 (ref 5–15)
BUN: 71 mg/dL — ABNORMAL HIGH (ref 8–23)
CO2: 24 mmol/L (ref 22–32)
Calcium: 8.3 mg/dL — ABNORMAL LOW (ref 8.9–10.3)
Chloride: 96 mmol/L — ABNORMAL LOW (ref 98–111)
Creatinine, Ser: 3.36 mg/dL — ABNORMAL HIGH (ref 0.44–1.00)
GFR calc Af Amer: 15 mL/min — ABNORMAL LOW (ref >60)
GFR calc non Af Amer: 13 mL/min — ABNORMAL LOW (ref >60)
Glucose, Bld: 336 mg/dL — ABNORMAL HIGH (ref 70–99)
Potassium: 4.8 mmol/L (ref 3.5–5.1)
Sodium: 133 mmol/L — ABNORMAL LOW (ref 135–145)
Total Bilirubin: 0.5 mg/dL (ref 0.3–1.2)
Total Protein: 5.4 g/dL — ABNORMAL LOW (ref 6.5–8.1)

## 2019-11-24 LAB — GLUCOSE, CAPILLARY
Glucose-Capillary: 310 mg/dL — ABNORMAL HIGH (ref 70–99)
Glucose-Capillary: 441 mg/dL — ABNORMAL HIGH (ref 70–99)
Glucose-Capillary: 280 mg/dL — ABNORMAL HIGH (ref 70–99)
Glucose-Capillary: 293 mg/dL — ABNORMAL HIGH (ref 70–99)

## 2019-11-24 LAB — CBC
HCT: 30.2 % — ABNORMAL LOW (ref 36.0–46.0)
Hemoglobin: 9.5 g/dL — ABNORMAL LOW (ref 12.0–15.0)
MCH: 28.4 pg (ref 26.0–34.0)
MCHC: 31.5 g/dL (ref 30.0–36.0)
MCV: 90.4 fL (ref 80.0–100.0)
Platelets: 480 10*3/uL — ABNORMAL HIGH (ref 150–400)
RBC: 3.34 MIL/uL — ABNORMAL LOW (ref 3.87–5.11)
RDW: 16.3 % — ABNORMAL HIGH (ref 11.5–15.5)
WBC: 10.5 10*3/uL (ref 4.0–10.5)
nRBC: 0 % (ref 0.0–0.2)

## 2019-11-24 LAB — C-REACTIVE PROTEIN: CRP: 0.8 mg/dL (ref <1.0)

## 2019-11-24 MED ORDER — INSULIN ASPART 100 UNIT/ML ~~LOC~~ SOLN: 8.0000 [IU] | Freq: Three times a day (TID) | SUBCUTANEOUS | Status: DC

## 2019-11-24 MED ORDER — ZINC SULFATE 220 (50 ZN) MG PO CAPS
220.0000 mg | ORAL_CAPSULE | Freq: Every day | ORAL | Status: DC
  Administered 2019-11-24 – 2019-11-27 (×4): 220 mg via ORAL
  Filled 2019-11-24 (×4): qty 1

## 2019-11-24 MED ORDER — HEPARIN SODIUM (PORCINE) 5000 UNIT/ML IJ SOLN
5000.0000 [IU] | Freq: Three times a day (TID) | INTRAMUSCULAR | Status: DC
  Administered 2019-11-24 – 2019-11-27 (×10): 5000 [IU] via SUBCUTANEOUS
  Filled 2019-11-24 (×10): qty 1

## 2019-11-24 MED ORDER — INSULIN GLARGINE 100 UNIT/ML ~~LOC~~ SOLN
4.0000 [IU] | Freq: Once | SUBCUTANEOUS | Status: AC
  Administered 2019-11-24: 17:00:00 4 [IU] via SUBCUTANEOUS
  Filled 2019-11-24: qty 0.04

## 2019-11-24 MED ORDER — AMLODIPINE BESYLATE 5 MG PO TABS
5.0000 mg | ORAL_TABLET | Freq: Once | ORAL | Status: DC
  Filled 2019-11-24: qty 1

## 2019-11-24 MED ORDER — INSULIN GLARGINE 100 UNIT/ML ~~LOC~~ SOLN: 18.0000 [IU] | Freq: Every morning | SUBCUTANEOUS | Status: DC

## 2019-11-24 MED ORDER — AMLODIPINE BESYLATE 10 MG PO TABS: 10.0000 mg | ORAL_TABLET | Freq: Every day | ORAL | Status: DC

## 2019-11-24 MED ORDER — INSULIN GLARGINE 100 UNIT/ML ~~LOC~~ SOLN
12.0000 [IU] | Freq: Every morning | SUBCUTANEOUS | Status: DC
  Filled 2019-11-24: qty 0.12

## 2019-11-24 MED ORDER — INSULIN ASPART 100 UNIT/ML ~~LOC~~ SOLN: 6.0000 [IU] | Freq: Three times a day (TID) | SUBCUTANEOUS | Status: DC

## 2019-11-24 MED ORDER — INSULIN GLARGINE 100 UNIT/ML ~~LOC~~ SOLN
12.0000 [IU] | Freq: Once | SUBCUTANEOUS | Status: DC
  Filled 2019-11-24: qty 0.12

## 2019-11-24 MED ORDER — VITAMIN D (ERGOCALCIFEROL) 1.25 MG (50000 UNIT) PO CAPS
50000.0000 [IU] | ORAL_CAPSULE | ORAL | Status: DC
  Administered 2019-11-26: 09:00:00 50000 [IU] via ORAL
  Filled 2019-11-24: qty 1

## 2019-11-24 NOTE — Consult Note (Signed)
[facility] Renal Consultation Note  Requesting MD:  Indication for Consultation: acute kidney injury  Maintenance of euvolemia and treatment of electrolyte abnormalities  HPI: Elizabeth Olsen is a 79 y.o. female with a history of diabetes hypertension hyperlipidemia and hypothyroidism  She has a history of CHF with diastolic dysfunction and EF of 55 - 60 %  11/20/19.   She has chronic renal failure and a creatinine of 1.7 mg/dl   She was admitted 1/21 with Covid pneumonia. Her urine output has been about 500 cc  1/23   300 cc 1/24  And 33cc 1/25  There has been no hemodynamic instability looking at her vital sign trends  Her creatinine has increased daily    BP 174/71  P 85  T 97.8   sats 96 %  Na 133 K 4.8  Cl 96  Co2 24  BUN 71   Cr 3.36  Glucose 336 Ca 8.3   AST 36  ALT 21    Hb 9.5  Wbc 10.5  Platelets 480   Urinalysis > 300   Protein    0 - 5 rbc and wbc  V/Q scan  1/22    No PE  No contrast administer  Amlodipine 10 mg daily, Lipitor 10 mg daily, coreg 6.25 mg bid, decadron 6mg  IV daily, fenofibrate 160 mg daily, lantus 12 units, levothyroxine 112 mcg daily, protonix 40 mg IV q 12 hrs, remdesivir 100mg  daily  Home meds    Amlodipine, lipitor, coreg, lasix 40 mg daily. Insulin, iron, losartan, levothyroxine   Creatinine  Date/Time Value Ref Range Status  09/10/2019 11:22 AM 1.79 (H) 0.44 - 1.00 mg/dL Final  03/03/2019 10:48 AM 1.31 (H) 0.44 - 1.00 mg/dL Final  09/04/2018 10:33 AM 1.60 (H) 0.44 - 1.00 mg/dL Final  12/26/2017 09:33 AM 1.30 (H) 0.60 - 1.10 mg/dL Final  11/07/2017 04:06 PM 1.53 (H) 0.70 - 1.30 mg/dL Final   Creatinine, Ser  Date/Time Value Ref Range Status  11/24/2019 05:00 AM 3.36 (H) 0.44 - 1.00 mg/dL Final  11/23/2019 02:29 AM 2.89 (H) 0.44 - 1.00 mg/dL Final  11/22/2019 02:53 AM 2.47 (H) 0.44 - 1.00 mg/dL Final  11/21/2019 02:26 AM 2.19 (H) 0.44 - 1.00 mg/dL Final  11/20/2019 05:15 AM 1.66 (H) 0.44 - 1.00 mg/dL Final  11/20/2019  12:49 AM 1.73 (H) 0.44 - 1.00 mg/dL Final  08/18/2019 10:35 AM 1.75 (H) 0.57 - 1.00 mg/dL Final  09/04/2018 01:51 PM 1.23 (H) 0.44 - 1.00 mg/dL Final  08/26/2018 01:40 PM 1.44 (H) 0.57 - 1.00 mg/dL Final  08/19/2018 10:40 AM 1.94 (H) 0.57 - 1.00 mg/dL Final  04/13/2018 10:35 PM 1.30 (H) 0.44 - 1.00 mg/dL Final  04/13/2018 06:44 PM 1.36 (H) 0.44 - 1.00 mg/dL Final  01/22/2018 12:35 PM 1.26 (H) 0.44 - 1.00 mg/dL Final  01/18/2018 01:27 PM 1.34 (H) 0.44 - 1.00 mg/dL Final  11/19/2017 10:09 PM 1.45 (H) 0.44 - 1.00 mg/dL Final  10/29/2017 01:36 PM 1.19 (H) 0.57 - 1.00 mg/dL Final  10/16/2017 03:57 PM 1.37 (H) 0.57 - 1.00 mg/dL Final  10/14/2017 03:26 AM 1.49 (H) 0.44 - 1.00 mg/dL Final  10/13/2017 06:40 AM 1.10 (H) 0.44 - 1.00 mg/dL Final  09/06/2017 12:00 AM 1.09 (H) 0.57 - 1.00 mg/dL Final  08/10/2017 12:31 PM 0.93 0.57 - 1.00 mg/dL Final  07/23/2017 03:38 PM 1.19 (H) 0.57 - 1.00 mg/dL Final  06/19/2017 09:15 AM 0.94 0.44 - 1.00 mg/dL Final  03/29/2014 06:30 PM 0.69 0.50 - 1.10  mg/dL Final  04/12/2013 05:20 PM 0.70 0.50 - 1.10 mg/dL Final  03/17/2013 04:00 PM 0.51 0.50 - 1.10 mg/dL Final     PMHx:   Past Medical History:  Diagnosis Date  . Anemia   . Blood transfusion   . Blood transfusion without reported diagnosis    with breast surgery  . Breast cancer ([place]) 15 years ago   left   . CHF (congestive heart failure) ([place])   . 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 Diabetes mellitus    dx 1998.  was told prior to getting chemo that her bld sugar rose.  She thought it would go back  . Dyspnea    d/t anemia  . GERD (gastroesophageal reflux disease)   . Heart murmur    as child  . Hernia, incisional   . Hyperlipidemia   . Hypertension   . Hypothyroidism   . Vitamin D deficiency     Past Surgical History:  Procedure Laterality Date  . ABDOMINAL HYSTERECTOMY    . APPENDECTOMY    . BREAST SURGERY    . CHOLECYSTECTOMY  01/22/2018   LAPROSCOPIC   . CHOLECYSTECTOMY N/A  01/22/2018   Procedure: LAPAROSCOPIC CHOLECYSTECTOMY WITH INTRAOPERATIVE CHOLANGIOGRAM;  Surgeon: Donnie Mesa, MD;  Location: [facility];  Service: General;  Laterality: N/A;  . COLONOSCOPY  2013   due next 03-2017  . EYE SURGERY     bil cataracts  . IUD REMOVAL     with appendectomy  . MASTECTOMY, MODIFIED RADICAL W/RECONSTRUCTION Left 15 years ago   10 nodes out  . TUMOR EXCISION Left    x 2, neck, head    Family Hx:  Family History  Problem Relation Age of Onset  . Diabetes Other        both sides of family  . Hypertension Father   . Congestive Heart Failure Father   . Peripheral vascular disease Father   . Hypertension Maternal Grandfather   . Hypertension Maternal Grandmother   . Stomach cancer Maternal Grandmother        GGM  . Heart attack Brother   . Colon cancer Neg Hx   . Esophageal cancer Neg Hx   . Pancreatic cancer Neg Hx   . Rectal cancer Neg Hx     Social History:  reports that she has never smoked. She has never used smokeless tobacco. She reports that she does not drink alcohol or use drugs.  Allergies:  Allergies  Allergen Reactions  . Erythromycin Shortness Of Breath and Swelling    "throat swelling", sob, thrashing ,   . Penicillins Rash    Has patient had a PCN reaction causing immediate rash, facial/tongue/throat swelling, SOB or lightheadedness with hypotension: Unknown Has patient had a PCN reaction causing severe rash involving mucus membranes or skin necrosis: No Has patient had a PCN reaction that required hospitalization: No Has patient had a PCN reaction occurring within the last 10 years: No If all of the above answers are "NO", then may proceed with Cephalosporin use.   Marland Kitchen Nyquil [Pseudoeph-Doxylamine-Dm-Apap] Rash  . Sulfa Antibiotics Rash    Medications: Prior to Admission medications   Medication Sig Start Date End Date Taking? Authorizing Provider  amLODipine (NORVASC) 5 MG tablet Take 5 mg by mouth daily. 10/16/19  Yes [provider]  atorvastatin (LIPITOR) 10 MG tablet TAKE 1 TABLET BY MOUTH EVERY DAY ONE TIME ONLY Patient taking differently: Take 10 mg by mouth daily.  08/20/18  Yes [name]  J, MD  calcium carbonate (OSCAL) 1500 (600 Ca) MG TABS tablet Take 600 mg by mouth daily with breakfast.    Yes [provider]  carvedilol (COREG) 6.25 MG tablet Take 1 tablet (6.25 mg total) by mouth 2 (two) times daily. 08/07/19  Yes Park Liter, MD  fenofibrate 160 MG tablet Take 160 mg by mouth daily.   Yes [provider]  fish oil-omega-3 fatty acids 1000 MG capsule Take 1 g by mouth daily.    Yes [provider]  furosemide (LASIX) 20 MG tablet TAKE 2 TABLETS BY MOUTH EVERY DAY Patient taking differently: Take 40 mg by mouth daily.  11/10/19  Yes Park Liter, MD  Insulin Glargine (LANTUS SOLOSTAR) 100 UNIT/ML Solostar Pen Inject 4-6 Units into the skin See admin instructions. Use 6 units every morning and use 4 units every evening   Yes [provider]  insulin lispro (HUMALOG KWIKPEN) 100 UNIT/ML KiwkPen Inject 5-20 Units into the skin 3 (three) times daily. Sliding Scale   Yes [provider]  iron polysaccharides (NIFEREX) 150 MG capsule Take 150 mg by mouth 2 (two) times daily.    Yes [provider]  levothyroxine (SYNTHROID, LEVOTHROID) 112 MCG tablet Take 112 mcg by mouth See admin instructions. Take 1 tablet (112 mcg) by mouth on 6 days of the week; hold on Sundays.   Yes [provider]  losartan (COZAAR) 25 MG tablet TAKE 1 TABLET BY MOUTH DAILY Patient taking differently: Take 25 mg by mouth daily.  06/02/19  Yes Park Liter, MD  vitamin C (ASCORBIC ACID) 500 MG tablet Take 500 mg by mouth daily.   Yes [provider]  Vitamin D, Ergocalciferol, (DRISDOL) 50000 UNITS CAPS Take 50,000 Units by mouth 2 (two) times a week. Sundays & Wednesdays. 02/20/12  Yes [provider]  Continuous Blood Gluc  Sensor (FREESTYLE LIBRE 14 DAY SENSOR) MISC See admin instructions. 10/22/19   [provider]  ONETOUCH VERIO test strip USE TO TEST UP TO 10 TIMES D 08/24/17   [provider]     Labs:  Results for orders placed or performed during the hospital encounter of 11/20/19 (from the past 48 hour(s))  Glucose, capillary     Status: Abnormal   Collection Time: 11/22/19  5:00 PM  Result Value Ref Range   Glucose-Capillary 287 (H) 70 - 99 mg/dL  Glucose, capillary     Status: Abnormal   Collection Time: 11/22/19 10:36 PM  Result Value Ref Range   Glucose-Capillary 261 (H) 70 - 99 mg/dL  C-reactive protein     Status: None   Collection Time: 11/23/19  2:29 AM  Result Value Ref Range   CRP 0.9 <1.0 mg/dL    Comment: Performed at [facility], [place] [address] [place], [place] [address]  CBC     Status: Abnormal   Collection Time: 11/23/19  2:29 AM  Result Value Ref Range   WBC 9.3 4.0 - 10.5 K/uL   RBC 3.01 (L) 3.87 - 5.11 MIL/uL   Hemoglobin 8.5 (L) 12.0 - 15.0 g/dL   HCT 27.0 (L) 36.0 - 46.0 %   MCV 89.7 80.0 - 100.0 fL   MCH 28.2 26.0 - 34.0 pg   MCHC 31.5 30.0 - 36.0 g/dL   RDW 15.9 (H) 11.5 - 15.5 %   Platelets 420 (H) 150 - 400 K/uL   nRBC 0.2 0.0 - 0.2 %    Comment: Performed at [facility], [place] [address], [place], [place] [address]  Comprehensive metabolic panel     Status: Abnormal   Collection Time: 11/23/19  2:29 AM  Result Value Ref Range   Sodium 135 135 - 145 mmol/L   Potassium 4.5 3.5 - 5.1 mmol/L   Chloride 98 98 - 111 mmol/L   CO2 25 22 - 32 mmol/L   Glucose, Bld 235 (H) 70 - 99 mg/dL   BUN 61 (H) 8 - 23 mg/dL   Creatinine, Ser 2.89 (H) 0.44 - 1.00 mg/dL   Calcium 8.3 (L) 8.9 - 10.3 mg/dL   Total Protein 5.3 (L) 6.5 - 8.1 g/dL   Albumin 2.4 (L) 3.5 - 5.0 g/dL   AST 34 15 - 41 U/L   ALT 20 0 - 44 U/L   Alkaline Phosphatase 51 38 - 126 U/L   Total Bilirubin 0.4 0.3 - 1.2 mg/dL   GFR calc non Af Amer 15 (L) >60 mL/min   GFR calc  Af Amer 17 (L) >60 mL/min   Anion gap 12 5 - 15    Comment: Performed at [facility], [place] [address] [place], [place] [address]  Phosphorus     Status: None   Collection Time: 11/23/19  2:29 AM  Result Value Ref Range   Phosphorus 4.5 2.5 - 4.6 mg/dL    Comment: Performed at [place] [address], [place], [place] [address]  Magnesium     Status: None   Collection Time: 11/23/19  2:29 AM  Result Value Ref Range   Magnesium 2.0 1.7 - 2.4 mg/dL    Comment: Performed at [facility], [place] [address], [place], [place] [address]  Glucose, capillary     Status: Abnormal   Collection Time: 11/23/19  7:59 AM  Result Value Ref Range   Glucose-Capillary 325 (H) 70 - 99 mg/dL  Glucose, capillary     Status: Abnormal   Collection Time: 11/23/19 11:55 AM  Result Value Ref Range   Glucose-Capillary 370 (H) 70 - 99 mg/dL  Hemoglobin and hematocrit, blood     Status: Abnormal   Collection Time: 11/23/19  3:46 PM  Result Value Ref Range   Hemoglobin 9.4 (L) 12.0 - 15.0 g/dL   HCT 29.2 (L) 36.0 - 46.0 %    Comment: Performed at [facility], [place] [address], [place], [place] [address]  Glucose, capillary     Status: Abnormal   Collection Time: 11/23/19  4:10 PM  Result Value Ref Range   Glucose-Capillary 300 (H) 70 - 99 mg/dL  Glucose, capillary     Status: Abnormal   Collection Time: 11/23/19  8:52 PM  Result Value Ref Range   Glucose-Capillary 230 (H) 70 - 99 mg/dL  C-reactive protein     Status: None   Collection Time: 11/24/19  5:00 AM  Result Value Ref Range   CRP 0.8 <1.0 mg/dL    Comment: Performed at [facility], [place] [address], [place], [place] [address]  CBC     Status: Abnormal   Collection Time: 11/24/19  5:00 AM  Result Value Ref Range   WBC 10.5 4.0 - 10.5 K/uL   RBC 3.34 (L) 3.87 - 5.11 MIL/uL   Hemoglobin 9.5 (L) 12.0 - 15.0 g/dL   HCT 30.2 (L) 36.0 - 46.0 %   MCV 90.4 80.0 - 100.0 fL   MCH 28.4 26.0 - 34.0 pg   MCHC 31.5 30.0 -  36.0 g/dL   RDW 16.3 (H) 11.5 - 15.5 %   Platelets 480 (H) 150 - 400 K/uL   nRBC 0.0 0.0 - 0.2 %    Comment: Performed at [facility], [place] [address] [place], [place] [address]  Comprehensive metabolic panel     Status: Abnormal   Collection Time: 11/24/19  5:00 AM  Result Value Ref Range   Sodium 133 (L) 135 - 145 mmol/L   Potassium 4.8 3.5 - 5.1 mmol/L   Chloride 96 (L) 98 - 111 mmol/L   CO2 24 22 - 32 mmol/L   Glucose, Bld 336 (H) 70 - 99 mg/dL   BUN 71 (H) 8 - 23 mg/dL   Creatinine, Ser 3.36 (H) 0.44 - 1.00 mg/dL   Calcium 8.3 (L) 8.9 - 10.3 mg/dL   Total Protein 5.4 (L) 6.5 - 8.1 g/dL   Albumin 2.5 (L) 3.5 - 5.0 g/dL   AST 36 15 - 41 U/L   ALT 21 0 - 44 U/L   Alkaline Phosphatase 54 38 - 126 U/L   Total Bilirubin 0.5 0.3 - 1.2 mg/dL   GFR calc non Af Amer 13 (L) >60 mL/min   GFR calc Af Amer 15 (L) >60 mL/min   Anion gap 13 5 - 15    Comment: Performed at [facility], [place] [address], [place], [place] [address]  Glucose, capillary     Status: Abnormal   Collection Time: 11/24/19  7:36 AM  Result Value Ref Range   Glucose-Capillary 310 (H) 70 - 99 mg/dL  Glucose, capillary     Status: Abnormal   Collection Time: 11/24/19 11:04 AM  Result Value Ref Range   Glucose-Capillary 441 (H) 70 - 99 mg/dL     ROS:  General  No distress Eyes  No retinopathy no visual complaints ENT  No ear pain  No nasal congestion CVS  No chest pain  Orthopnea no ankle swelling RS  No cough sputum  AS  No nausea or vomiting UG  No urgency no frequency  No dysuria Neuro  No cva weakness or focal abnormalities Endo  Dm and Hypothyroidism  Derm  No rashes or itching     Physical Exam: Vitals:   11/23/19 2054 11/24/19 0530  BP: (!) 134/56 (!) 174/71  Pulse:  85  Resp:  19  Temp: 97.7 F (36.5 C) 97.8 F (36.6 C)  SpO2:  94%     General: Alert non distressed HEENT: Normocephalic atraumatic  Normal appearance Eyes: EOMI PERRLA Neck: supple no JVP  Heart:RRR no Mr g   Lungs: Clear no wheezes rales Abdomen: soft non tender bs normal  Extremities: no edema  Skin: normal no rash  Ulcers  Neuro: intact and non focal   Assessment/Plan: 1.Renal- Acute kidney injury without hydronephrosis and bland urine sediment   Nothing to suggest AIN or AGN   Patient was on ARB prior to admission and this was stopped and not ordered    There may have been some ischemic ATN on admission with possible but undocumented low blood pressures.  Would continue to avoid nephrotoxins inc contrast  ACE  ARB and NSAIDS  Will continue protonix for now.  Hopefully should plateau  2. Hypertension/volume  - appears euvolemic at this point  Does have history of diastolic heart failure and was taking lasix 40 mg daily as an outpatient 3. Covid pneumonia  Appears stable on dexa and remdesavir 4. History of GI blood loss and drop  in Hb  Will check some iron studies 5. Diabetes  As per primary  6. Hypothyroidism as per primary      Sherril Croon 11/24/2019, 11:55 AM

## 2019-11-24 NOTE — Progress Notes (Addendum)
PROGRESS NOTE    Elizabeth Olsen  YTK:[id number] DOB: [birth year] DOA: 11/20/2019 PCP: [name], MD   Brief Narrative:  Elizabeth Olsen  is a 79 y.o. female,  with Jerrye Bushy, vitamin D deficiency, h/o remote breast cancer,  hypothyroidism, hypertension, hyperlipidemia, DM2, CHF (diastolic), presents with dyspnea x 4 days. Slight cough, chronic, (pt attributes cough to her bp medication), slight weigh gain 10 lbs over the past few months.  Slight leg swelling right > left.   Pt denies fever, chills, cp, palp, n/v, diarrhea, brbpr.   Patient is Covid positive, venous duplex negative for DVT/ VQ scan negative for PE. Her Hb dropped by 1.2 gm, occult positive stools, GI consulted, recommend. No EGD/ colonoscopy unless patient symptomatic( Hematemesis, Melena). She developed AKI, lasix discontinued, consider nephrology if serum creatinine continues to rise.   Subjective:  Patient denies any dyspnea today, no blood in stool, reports she has been making good urine.  Assessment & Plan:   Active Problems:   Diabetes mellitus ([place])   Hyperlipidemia   Anemia   Essential hypertension   Dyspnea   Acute diastolic CHF (congestive heart failure) ([place])   COVID-19 virus infection  COVID-19 infection  -Patient was treated with remdesivir, and steroids.  Will discontinue steroids given significant hyperglycemia, and no hypoxia. -Inflammatory markers showing elevated D-dimers on admission, venous Dopplers negative for DVT, VQ scan with no evidence of PE, unable to obtain CTA due to elevated creatinine. -She was encouraged to use incentive spirometry and flutter valve again today.  Acute on chronic diastolic dysfunction -Recent echo shows grade 2 diastolic dysfunction apparently echo was repeated here as well even though it was done 1 month ago. - Lasix and losartan on hold due to worsening renal functions. -Imaging on admission showing evidence of volume overload, patient was kept on IV Lasix, has been  stopped yesterday given worsening renal function  Acute kidney injury could be dehydration and medication induced. -Baseline creatinine 1.7, peaked to 3.35 today, renal consult greatly appreciated, avoid nephrotoxic medications, IV contrast and hypotension .  Diabetes mellitus, poorly controlled due to hyperglycemia -CBG remains significantly uncontrolled, I have increased Premeal insulin, and morning Lantus to 18 units. -Required insulin drip initially  Hyponatremia -Due to volume overload on presentation  Hypertension -Blood pressure remains uncontrolled, will increase his Norvasc  GI bleeding: Her Hb dropped by 1.2, Stool for occult blood +, Gi consulted, recommend no EGD/colonoscopy unless symptomatic (hematemesis, melena) H and H stable.  Protonix BID  DVT prophylaxis: Heparin  Code Status: Full Family Communication: Discussed with patient in details Disposition Plan:  Home with home health once renal function has improved, significantly elevated at 3.35 this morning Consultants:    None  Procedures:  Antimicrobials:  Anti-infectives (From admission, onward)   Start     Dose/Rate Route Frequency Ordered Stop   11/21/19 1000  remdesivir 100 mg in sodium chloride 0.9 % 100 mL IVPB     100 mg 200 mL/hr over 30 Minutes Intravenous Daily 11/20/19 0503 11/24/19 0951   11/20/19 0515  remdesivir 200 mg in sodium chloride 0.9% 250 mL IVPB     200 mg 580 mL/hr over 30 Minutes Intravenous Once 11/20/19 0500 11/20/19 0652        Objective: Vitals:   11/23/19 1500 11/23/19 2054 11/24/19 0530 11/24/19 1302  BP:  (!) 134/56 (!) 174/71 (!) 105/45  Pulse: 76  85 77  Resp: 15  19   Temp:  97.7 F (36.5 C) 97.8 F (36.6  C) 98.5 F (36.9 C)  TempSrc:  Oral Oral Oral  SpO2: 97%  94% 99%  Weight:   73.1 kg   Height:        Intake/Output Summary (Last 24 hours) at 11/24/2019 1423 Last data filed at 11/24/2019 0913 Gross per 24 hour  Intake 253 ml  Output 300 ml  Net -47  ml   Filed Weights   11/22/19 0500 11/23/19 0522 11/24/19 0530  Weight: 71.5 kg 72.5 kg 73.1 kg    Examination:  Awake Alert, Oriented X 3, No new F.N deficits, Normal affect  Symmetrical Chest wall movement, Good air movement bilaterally, CTAB RRR,No Gallops,Rubs or new Murmurs, No Parasternal Heave +ve B.Sounds, Abd Soft, No tenderness, No rebound - guarding or rigidity. No Cyanosis, Clubbing or edema, No new Rash or bruise     Data Reviewed: I have personally reviewed following labs and imaging studies  CBC: Recent Labs  Lab 11/18/19 1002 11/18/19 1002 11/20/19 0049 11/20/19 0515 11/21/19 0226 11/21/19 0226 11/21/19 1023 11/22/19 0253 11/23/19 0229 11/23/19 1546 11/24/19 0500  WBC 9.2  --  8.3   < > 8.9  --  8.7 10.0 9.3  --  10.5  NEUTROABS 6.5  --  6.6  --   --   --   --   --   --   --   --   HGB 9.2*  --  9.8*   < > 8.4*   < > 9.0* 8.8* 8.5* 9.4* 9.5*  HCT 29.2*   < > 31.2*   < > 26.6*   < > 29.8* 27.8* 27.0* 29.2* 30.2*  MCV 89.3   < > 91.0   < > 90.2  --  91.7 89.7 89.7  --  90.4  PLT 313  --  296   < > 319  --  416* 397 420*  --  480*   < > = values in this interval not displayed.   Basic Metabolic Panel: Recent Labs  Lab 11/20/19 0515 11/21/19 0226 11/22/19 0253 11/23/19 0229 11/24/19 0500  NA 136 136 137 135 133*  K 4.2 4.7 4.7 4.5 4.8  CL 101 102 101 98 96*  CO2 23 25 26 25 24   GLUCOSE 235* 228* 266* 235* 336*  BUN 35* 41* 47* 61* 71*  CREATININE 1.66* 2.19* 2.47* 2.89* 3.36*  CALCIUM 9.0 8.4* 8.5* 8.3* 8.3*  MG  --   --  2.0 2.0  --   PHOS  --   --  4.4 4.5  --    GFR: Estimated Creatinine Clearance: 13.4 mL/min (A) (by C-G formula based on SCr of 3.36 mg/dL (H)). Liver Function Tests: Recent Labs  Lab 11/20/19 0515 11/21/19 0226 11/22/19 0253 11/23/19 0229 11/24/19 0500  AST 42* 30 38 34 36  ALT 23 19 20 20 21   ALKPHOS 65 53 56 51 54  BILITOT 0.8 0.4 0.4 0.4 0.5  PROT 7.1 5.2* 5.4* 5.3* 5.4*  ALBUMIN 3.4* 2.5* 2.6* 2.4* 2.5*    No results for input(s): LIPASE, AMYLASE in the last 168 hours. No results for input(s): AMMONIA in the last 168 hours. Coagulation Profile: No results for input(s): INR, PROTIME in the last 168 hours. Cardiac Enzymes: Recent Labs  Lab 11/20/19 0515  CKTOTAL 345*  CKMB 6.1*   BNP (last 3 results) Recent Labs    08/18/19 1035  PROBNP 1,864*   HbA1C: No results for input(s): HGBA1C in the last 72 hours. CBG: Recent Labs  Lab 11/23/19  1155 11/23/19 1610 11/23/19 2052 11/24/19 0736 11/24/19 1104  GLUCAP 370* 300* 230* 310* 441*   Lipid Profile: No results for input(s): CHOL, HDL, LDLCALC, TRIG, CHOLHDL, LDLDIRECT in the last 72 hours. Thyroid Function Tests: No results for input(s): TSH, T4TOTAL, FREET4, T3FREE, THYROIDAB in the last 72 hours. Anemia Panel: No results for input(s): VITAMINB12, FOLATE, FERRITIN, TIBC, IRON, RETICCTPCT in the last 72 hours. Sepsis Labs: Recent Labs  Lab 11/20/19 0515 11/22/19 0253  LATICACIDVEN 2.2* 1.3    Recent Results (from the past 240 hour(s))  Respiratory Panel by RT PCR (Flu A&B, Covid) - Nasopharyngeal Swab     Status: Abnormal   Collection Time: 11/20/19  2:43 AM   Specimen: Nasopharyngeal Swab  Result Value Ref Range Status   SARS Coronavirus 2 by RT PCR POSITIVE (A) NEGATIVE Final    Comment: RESULT CALLED TO, READ BACK BY AND VERIFIED WITH: L VENEGAS RN 11/20/19 0401  JDW (NOTE) SARS-CoV-2 target nucleic acids are DETECTED. SARS-CoV-2 RNA is generally detectable in upper respiratory specimens  during the acute phase of infection. Positive results are indicative of the presence of the identified virus, but do not rule out bacterial infection or co-infection with other pathogens not detected by the test. Clinical correlation with patient history and other diagnostic information is necessary to determine patient infection status. The expected result is Negative. Fact Sheet for Patients:   PinkCheek.be Fact Sheet for Healthcare Providers: GravelBags.it This test is not yet approved or cleared by the [place] FDA and  has been authorized for detection and/or diagnosis of SARS-CoV-2 by FDA under an Emergency Use Authorization (EUA).  This EUA will remain in effect (meaning this test can be used) for th e duration of  the COVID-19 declaration under Section 564(b)(1) of the Act, 21 U.S.C. section 360bbb-3(b)(1), unless the authorization is terminated or revoked sooner.    Influenza A by PCR NEGATIVE NEGATIVE Final   Influenza B by PCR NEGATIVE NEGATIVE Final    Comment: (NOTE) The Xpert Xpress SARS-CoV-2/FLU/RSV assay is intended as an aid in  the diagnosis of influenza from Nasopharyngeal swab specimens and  should not be used as a sole basis for treatment. Nasal washings and  aspirates are unacceptable for Xpert Xpress SARS-CoV-2/FLU/RSV  testing. Fact Sheet for Patients: PinkCheek.be Fact Sheet for Healthcare Providers: GravelBags.it This test is not yet approved or cleared by the [place] FDA and  has been authorized for detection and/or diagnosis of SARS-CoV-2 by  FDA under an Emergency Use Authorization (EUA). This EUA will remain  in effect (meaning this test can be used) for the duration of the  Covid-19 declaration under Section 564(b)(1) of the Act, 21  U.S.C. section 360bbb-3(b)(1), unless the authorization is  terminated or revoked. Performed at [facility], [place] [address] [place], [place] [address]      Radiology Studies: No results found.  Scheduled Meds: . [START ON 11/25/2019] amLODipine  10 mg Oral Daily  . amLODipine  5 mg Oral Once  . atorvastatin  10 mg Oral Daily  . bacitracin   Topical BID  . carvedilol  6.25 mg Oral BID  . dexamethasone (DECADRON) injection  6 mg Intravenous Q0600  . fenofibrate  160 mg Oral  Daily  . insulin aspart  0-15 Units Subcutaneous TID WC  . insulin aspart  0-5 Units Subcutaneous QHS  . insulin aspart  8 Units Subcutaneous TID WC  . insulin glargine  12 Units Subcutaneous Once  . [START ON  11/25/2019] insulin glargine  18 Units Subcutaneous q morning - 10a  . insulin glargine  4 Units Subcutaneous QHS  . iron polysaccharides  150 mg Oral BID  . levothyroxine  112 mcg Oral Once per day on Mon Tue Wed Thu Fri Sat  . pantoprazole (PROTONIX) IV  40 mg Intravenous Q12H  . [START ON 11/26/2019] Vitamin D (Ergocalciferol)  50,000 Units Oral Once per day on Sun Wed  . zinc sulfate  220 mg Oral Daily   Continuous Infusions:    LOS: 4 days     Phillips Climes, MD [facility]   If 7PM-7AM, please contact night-coverage

## 2019-11-24 NOTE — Progress Notes (Signed)
Inpatient Diabetes Program Recommendations  AACE/ADA: New Consensus Statement on Inpatient Glycemic Control (2015)  Target Ranges:  Prepandial:   less than 140 mg/dL      Peak postprandial:   less than 180 mg/dL (1-2 hours)      Critically ill patients:  140 - 180 mg/dL   Lab Results  Component Value Date   GLUCAP 441 (H) 11/24/2019   HGBA1C 9.0 (H) 11/20/2019    Review of Glycemic Control Results for Elizabeth Olsen, Elizabeth Olsen (MRN [id number]) as of 11/24/2019 11:18  Ref. Range 11/23/2019 16:10 11/23/2019 20:52 11/24/2019 07:36 11/24/2019 11:04  Glucose-Capillary Latest Ref Range: 70 - 99 mg/dL 300 (H) 230 (H) 310 (H) 441 (H)   Diabetes history: DM 2 Outpatient Diabetes medications: Lantus 6 units Qam and 4 units qpm, Humalog 5-20 units tid Current orders for Inpatient glycemic control:  Lantus 6 units Qam and 4 units qpm Novolog 0-9 units tid + hs  Decadron 6 mg Daily  Inpatient Diabetes Program Recommendations:    -Consider increasing Lantus to 8 units BID.   - Of note, patient is at more risk of hypoglycemia if insulin given >1 hour after prior CBG and when multiple doses administered < 4 hours apart.   Thanks, Bronson Curb, MSN, RNC-OB Diabetes Coordinator [phone] (8a-5p)

## 2019-11-25 ENCOUNTER — Inpatient Hospital Stay (HOSPITAL_COMMUNITY): Payer: Medicare Other

## 2019-11-25 DIAGNOSIS — N179 Acute kidney failure, unspecified: Secondary | ICD-10-CM

## 2019-11-25 LAB — CBC
HCT: 31.7 % — ABNORMAL LOW (ref 36.0–46.0)
Hemoglobin: 10 g/dL — ABNORMAL LOW (ref 12.0–15.0)
MCH: 27.9 pg (ref 26.0–34.0)
MCHC: 31.5 g/dL (ref 30.0–36.0)
MCV: 88.5 fL (ref 80.0–100.0)
Platelets: 567 10*3/uL — ABNORMAL HIGH (ref 150–400)
RBC: 3.58 MIL/uL — ABNORMAL LOW (ref 3.87–5.11)
RDW: 16.5 % — ABNORMAL HIGH (ref 11.5–15.5)
WBC: 13.6 10*3/uL — ABNORMAL HIGH (ref 4.0–10.5)
nRBC: 0 % (ref 0.0–0.2)

## 2019-11-25 LAB — COMPREHENSIVE METABOLIC PANEL
ALT: 22 U/L (ref 0–44)
AST: 32 U/L (ref 15–41)
Albumin: 2.6 g/dL — ABNORMAL LOW (ref 3.5–5.0)
Alkaline Phosphatase: 58 U/L (ref 38–126)
Anion gap: 12 (ref 5–15)
BUN: 83 mg/dL — ABNORMAL HIGH (ref 8–23)
CO2: 23 mmol/L (ref 22–32)
Calcium: 8.4 mg/dL — ABNORMAL LOW (ref 8.9–10.3)
Chloride: 99 mmol/L (ref 98–111)
Creatinine, Ser: 3.51 mg/dL — ABNORMAL HIGH (ref 0.44–1.00)
GFR calc Af Amer: 14 mL/min — ABNORMAL LOW (ref >60)
GFR calc non Af Amer: 12 mL/min — ABNORMAL LOW (ref >60)
Glucose, Bld: 292 mg/dL — ABNORMAL HIGH (ref 70–99)
Potassium: 4.3 mmol/L (ref 3.5–5.1)
Sodium: 134 mmol/L — ABNORMAL LOW (ref 135–145)
Total Bilirubin: 0.9 mg/dL (ref 0.3–1.2)
Total Protein: 5.6 g/dL — ABNORMAL LOW (ref 6.5–8.1)

## 2019-11-25 LAB — GLUCOSE, CAPILLARY
Glucose-Capillary: 305 mg/dL — ABNORMAL HIGH (ref 70–99)
Glucose-Capillary: 154 mg/dL — ABNORMAL HIGH (ref 70–99)
Glucose-Capillary: 244 mg/dL — ABNORMAL HIGH (ref 70–99)
Glucose-Capillary: 68 mg/dL — ABNORMAL LOW (ref 70–99)
Glucose-Capillary: 70 mg/dL (ref 70–99)

## 2019-11-25 LAB — IRON AND TIBC
Iron: 35 ug/dL (ref 28–170)
Saturation Ratios: 14 % (ref 10.4–31.8)
TIBC: 244 ug/dL — ABNORMAL LOW (ref 250–450)
UIBC: 209 ug/dL

## 2019-11-25 MED ORDER — PANTOPRAZOLE SODIUM 40 MG PO TBEC
40.0000 mg | DELAYED_RELEASE_TABLET | Freq: Two times a day (BID) | ORAL | Status: DC
  Administered 2019-11-26 – 2019-11-27 (×2): 40 mg via ORAL
  Filled 2019-11-25 (×2): qty 1

## 2019-11-25 MED ORDER — INSULIN GLARGINE 100 UNIT/ML ~~LOC~~ SOLN
10.0000 [IU] | Freq: Every day | SUBCUTANEOUS | Status: DC
  Administered 2019-11-25: 10 [IU] via SUBCUTANEOUS
  Filled 2019-11-25: qty 0.1

## 2019-11-25 MED ORDER — INSULIN GLARGINE 100 UNIT/ML ~~LOC~~ SOLN
16.0000 [IU] | Freq: Every morning | SUBCUTANEOUS | Status: DC
  Filled 2019-11-25: qty 0.16

## 2019-11-25 MED ORDER — INSULIN ASPART 100 UNIT/ML ~~LOC~~ SOLN: 8.0000 [IU] | Freq: Three times a day (TID) | SUBCUTANEOUS | Status: DC

## 2019-11-25 MED ORDER — MENTHOL 3 MG MT LOZG
1.0000 | LOZENGE | OROMUCOSAL | Status: DC | PRN
  Administered 2019-11-25 (×4): 3 mg via ORAL
  Filled 2019-11-25: qty 9

## 2019-11-25 MED ORDER — LORATADINE 10 MG PO TABS
10.0000 mg | ORAL_TABLET | Freq: Every day | ORAL | Status: DC | PRN
  Administered 2019-11-25: 05:00:00 10 mg via ORAL
  Filled 2019-11-25: qty 1

## 2019-11-25 MED ORDER — AMLODIPINE BESYLATE 5 MG PO TABS
5.0000 mg | ORAL_TABLET | Freq: Every day | ORAL | Status: DC
  Administered 2019-11-25 – 2019-11-26 (×2): 5 mg via ORAL
  Filled 2019-11-25 (×2): qty 1

## 2019-11-25 MED ORDER — DIPHENHYDRAMINE HCL 25 MG PO CAPS
25.0000 mg | ORAL_CAPSULE | Freq: Three times a day (TID) | ORAL | Status: DC | PRN
  Administered 2019-11-25: 19:00:00 25 mg via ORAL
  Filled 2019-11-25: qty 1

## 2019-11-25 NOTE — Progress Notes (Signed)
[facility] ROUNDING NOTE   Subjective:   This is a 79 year old lady with history of diabetes, hypertension, hyperlipidemia, hypothyroidism, diastolic dysfunction with an ejection fraction of 55-60% 11/20/2019.  She is chronic renal insufficiency with a baseline serum creatinine 1.7 mg/dL.  She was admitted 11/20/2019 with Covid pneumonia urine output has been good.  She had 300 cc of urine 11/24/2019 her weight is increased mildly from 70.3 kg on admission 73.1 kg 11/25/2019  Blood pressure 161/60 pulse 75 temperature 97.6 O2 sats 96% room air  Sodium 134 potassium 4.3 chloride 99 CO2 23 BUN 83 creatinine 3.51 glucose 292 calcium 8.4 albumin 2.6 WBC 13.6 hemoglobin 10 platelets 567  Amlodipine 5 mg daily, atorvastatin 10 mg daily, Coreg 6.25 mg twice daily, fenofibrate 160 mg daily insulin sliding scale, NovoLog 8 units 3 times daily, glargine 16 units a.m. and 4 units p.m. dexamethasone 6 mg 24 hours  Objective:  Vital signs in last 24 hours:  Temp:  [97.6 F (36.4 C)-98.5 F (36.9 C)] 97.6 F (36.4 C) (01/26 0437) Pulse Rate:  [75-77] 75 (01/26 0437) Resp:  [12-20] 12 (01/26 0437) BP: (105-165)/(45-66) 165/66 (01/26 0437) SpO2:  [95 %-99 %] 97 % (01/26 0437) Weight:  [73.1 kg] 73.1 kg (01/26 0646)  Weight change: 0 kg Filed Weights   11/23/19 0522 11/24/19 0530 11/25/19 0646  Weight: 72.5 kg 73.1 kg 73.1 kg    Intake/Output: I/O last 3 completed shifts: In: 978 [P.O.:970; I.V.:8] Out: 300 [Urine:300]   Intake/Output this shift:  No intake/output data recorded.  General: Alert non distressed HEENT: Normocephalic atraumatic  Normal appearance Eyes: EOMI PERRLA Neck: supple no JVP  Heart:RRR no Mr g  Lungs: Clear no wheezes rales Abdomen: soft non tender bs normal  Extremities: no edema  Skin: normal no rash  Ulcers  Neuro: intact and non focal   Basic Metabolic Panel: Recent Labs  Lab 11/21/19 0226 11/21/19 0226 11/22/19 0253 11/22/19 0253  11/23/19 0229 11/24/19 0500 11/25/19 0429  NA 136  --  137  --  135 133* 134*  K 4.7  --  4.7  --  4.5 4.8 4.3  CL 102  --  101  --  98 96* 99  CO2 25  --  26  --  25 24 23   GLUCOSE 228*  --  266*  --  235* 336* 292*  BUN 41*  --  47*  --  61* 71* 83*  CREATININE 2.19*  --  2.47*  --  2.89* 3.36* 3.51*  CALCIUM 8.4*   < > 8.5*   < > 8.3* 8.3* 8.4*  MG  --   --  2.0  --  2.0  --   --   PHOS  --   --  4.4  --  4.5  --   --    < > = values in this interval not displayed.    Liver Function Tests: Recent Labs  Lab 11/21/19 0226 11/22/19 0253 11/23/19 0229 11/24/19 0500 11/25/19 0429  AST 30 38 34 36 32  ALT 19 20 20 21 22   ALKPHOS 53 56 51 54 58  BILITOT 0.4 0.4 0.4 0.5 0.9  PROT 5.2* 5.4* 5.3* 5.4* 5.6*  ALBUMIN 2.5* 2.6* 2.4* 2.5* 2.6*   No results for input(s): LIPASE, AMYLASE in the last 168 hours. No results for input(s): AMMONIA in the last 168 hours.  CBC: Recent Labs  Lab 11/18/19 1002 11/18/19 1002 11/20/19 0049 11/20/19 0515 11/21/19 1023 11/21/19 1023 11/22/19  7026 11/23/19 0229 11/23/19 1546 11/24/19 0500 11/25/19 0429  WBC 9.2  --  8.3   < > 8.7  --  10.0 9.3  --  10.5 13.6*  NEUTROABS 6.5  --  6.6  --   --   --   --   --   --   --   --   HGB 9.2*  --  9.8*   < > 9.0*   < > 8.8* 8.5* 9.4* 9.5* 10.0*  HCT 29.2*   < > 31.2*   < > 29.8*   < > 27.8* 27.0* 29.2* 30.2* 31.7*  MCV 89.3   < > 91.0   < > 91.7  --  89.7 89.7  --  90.4 88.5  PLT 313  --  296   < > 416*  --  397 420*  --  480* 567*   < > = values in this interval not displayed.    Cardiac Enzymes: Recent Labs  Lab 11/20/19 0515  CKTOTAL 345*  CKMB 6.1*    BNP: Invalid input(s): POCBNP  CBG: Recent Labs  Lab 11/24/19 0736 11/24/19 1104 11/24/19 1641 11/24/19 2205 11/25/19 0806  GLUCAP 310* 441* 280* 293* 305*    Microbiology: Results for orders placed or performed during the hospital encounter of 11/20/19  Respiratory Panel by RT PCR (Flu A&B, Covid) - Nasopharyngeal Swab      Status: Abnormal   Collection Time: 11/20/19  2:43 AM   Specimen: Nasopharyngeal Swab  Result Value Ref Range Status   SARS Coronavirus 2 by RT PCR POSITIVE (A) NEGATIVE Final    Comment: RESULT CALLED TO, READ BACK BY AND VERIFIED WITH: L VENEGAS RN 11/20/19 0401  JDW (NOTE) SARS-CoV-2 target nucleic acids are DETECTED. SARS-CoV-2 RNA is generally detectable in upper respiratory specimens  during the acute phase of infection. Positive results are indicative of the presence of the identified virus, but do not rule out bacterial infection or co-infection with other pathogens not detected by the test. Clinical correlation with patient history and other diagnostic information is necessary to determine patient infection status. The expected result is Negative. Fact Sheet for Patients:  PinkCheek.be Fact Sheet for Healthcare Providers: GravelBags.it This test is not yet approved or cleared by the [place] FDA and  has been authorized for detection and/or diagnosis of SARS-CoV-2 by FDA under an Emergency Use Authorization (EUA).  This EUA will remain in effect (meaning this test can be used) for th e duration of  the COVID-19 declaration under Section 564(b)(1) of the Act, 21 U.S.C. section 360bbb-3(b)(1), unless the authorization is terminated or revoked sooner.    Influenza A by PCR NEGATIVE NEGATIVE Final   Influenza B by PCR NEGATIVE NEGATIVE Final    Comment: (NOTE) The Xpert Xpress SARS-CoV-2/FLU/RSV assay is intended as an aid in  the diagnosis of influenza from Nasopharyngeal swab specimens and  should not be used as a sole basis for treatment. Nasal washings and  aspirates are unacceptable for Xpert Xpress SARS-CoV-2/FLU/RSV  testing. Fact Sheet for Patients: PinkCheek.be Fact Sheet for Healthcare Providers: GravelBags.it This test is not yet approved or  cleared by the [place] FDA and  has been authorized for detection and/or diagnosis of SARS-CoV-2 by  FDA under an Emergency Use Authorization (EUA). This EUA will remain  in effect (meaning this test can be used) for the duration of the  Covid-19 declaration under Section 564(b)(1) of the Act, 21  U.S.C. section 360bbb-3(b)(1), unless the authorization is  terminated or revoked. Performed at [facility], [place] [address], [place], [place] [address]     Coagulation Studies: No results for input(s): LABPROT, INR in the last 72 hours.  Urinalysis: No results for input(s): COLORURINE, LABSPEC, PHURINE, GLUCOSEU, HGBUR, BILIRUBINUR, KETONESUR, PROTEINUR, UROBILINOGEN, NITRITE, LEUKOCYTESUR in the last 72 hours.  Invalid input(s): APPERANCEUR    Imaging: No results found.   Medications:    . amLODipine  5 mg Oral Once  . amLODipine  5 mg Oral Daily  . atorvastatin  10 mg Oral Daily  . bacitracin   Topical BID  . carvedilol  6.25 mg Oral BID  . fenofibrate  160 mg Oral Daily  . heparin injection (subcutaneous)  5,000 Units Subcutaneous Q8H  . insulin aspart  0-15 Units Subcutaneous TID WC  . insulin aspart  0-5 Units Subcutaneous QHS  . insulin aspart  8 Units Subcutaneous TID WC  . insulin glargine  16 Units Subcutaneous q morning - 10a  . insulin glargine  4 Units Subcutaneous QHS  . iron polysaccharides  150 mg Oral BID  . levothyroxine  112 mcg Oral Once per day on Mon Tue Wed Thu Fri Sat  . pantoprazole (PROTONIX) IV  40 mg Intravenous Q12H  . [START ON 11/26/2019] Vitamin D (Ergocalciferol)  50,000 Units Oral Once per day on Sun Wed  . zinc sulfate  220 mg Oral Daily   hydrALAZINE, loratadine, menthol-cetylpyridinium  Assessment/ Plan:   Acute kidney injury without hydronephrosis plan urine sediment nondistressed AIN or ATN patient was on ARB prior to admission this was stopped.  There may be some ischemic ATN that was present on admission with undocumented  low blood pressures.  I would continue to avoid nephrotoxins.  I do not see a reason to discontinue Protonix at this time.  We will continue to follow renal functions.  Hypertension/volume amlodipine has been started 5 mg daily agree with this.  Covid pneumonia appears to be stable on dexamethasone appears to be completed 11/25/2019 and completed course of remdesivir  History of GI blood loss with drop in hemoglobin we will check iron studies  Diabetes mellitus as per primary  Hypothyroidism as per primary   LOS: [place] @TODAY @8 :57 AM

## 2019-11-25 NOTE — Progress Notes (Addendum)
PROGRESS NOTE    Elizabeth Olsen  FVC:[id number] DOB: June 22, 1941 DOA: 11/20/2019 PCP: [name], MD   Brief Narrative:  Elizabeth Olsen  is a 79 y.o. female,  with Jerrye Bushy, vitamin D deficiency, h/o remote breast cancer,  hypothyroidism, hypertension, hyperlipidemia, DM2, CHF (diastolic), presents with dyspnea x 4 days. Slight cough, chronic, (pt attributes cough to her bp medication), slight weigh gain 10 lbs over the past few months.  Slight leg swelling right > left.   Pt denies fever, chills, cp, palp, n/v, diarrhea, brbpr.   Patient is Covid positive, venous duplex negative for DVT/ VQ scan negative for PE. Her Hb dropped by 1.2 gm, occult positive stools, GI consulted, recommend. No EGD/ colonoscopy unless patient symptomatic( Hematemesis, Melena). She developed AKI, lasix discontinued, consider nephrology if serum creatinine continues to rise.   Subjective:  Patient denies any dyspnea today, no blood in stool, reports she has been making good urine.  Assessment & Plan:   Active Problems:   Diabetes mellitus ([place])   Hyperlipidemia   Anemia   Essential hypertension   Dyspnea   Acute diastolic CHF (congestive heart failure) ([place])   COVID-19 virus infection  COVID-19 infection  -Patient was treated with remdesivir, and steroids.  Will discontinue steroids given significant hyperglycemia, and no hypoxia. -Inflammatory markers showing elevated D-dimers on admission, venous Dopplers negative for DVT, VQ scan with no evidence of PE, unable to obtain CTA due to elevated creatinine. -She was encouraged to use incentive spirometry and flutter valve again today.  Acute on chronic diastolic dysfunction -Recent echo shows grade 2 diastolic dysfunction apparently echo was repeated here as well even though it was done 1 month ago. - Lasix and losartan on hold due to worsening renal functions. -Imaging on admission showing evidence of volume overload, patient was kept on IV Lasix, has been  stopped yesterday given worsening renal function  Acute kidney injury could be dehydration and medication induced. -Baseline creatinine 1.7, creatinine continues to increase, it is 3.5 today, renal consult greatly appreciated, possibly some underlining ATN on presentation with undocumented low blood pressure .  The use of losartan and Lasix at home. -avoid nephrotoxic medications, IV contrast and hypotension . 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 -Discussed with renal, creatinine need to start improving before she is stable for discharge  Diabetes mellitus, poorly controlled due to hyperglycemia -Overall CBG uncontrolled, with A1c of 9, patient had significant fear/anxiety from hypoglycemia, even though most recent episodes was few months ago, she has been resisting this her Lantus, so for now I will continue with Lantus 10 units daily, and 40 units nightly, and will discontinue NovoLog before meals, continue only with sliding scale , as she has been very hesitant to take any extra insulin . -Required insulin drip initially  Hyponatremia -Due to volume overload on presentation  Hypertension -Blood pressure was uncontrolled, started on Norvasc.  GI bleeding: Her Hb dropped by 1.2, Stool for occult blood +, Gi consulted, recommend no EGD/colonoscopy unless symptomatic (hematemesis, melena) H and H stable.  Protonix BID  DVT prophylaxis: Heparin  Code Status: Full Family Communication: Discussed with patient in details ,D/W daughter via [phone]  Disposition Plan:  Home with home health once renal function has improved, her creatinine continued to trend up, it is 3.5 today Consultants:   Renal  Procedures:  Antimicrobials:  Anti-infectives (From admission, onward)   Start  Dose/Rate Route Frequency Ordered Stop   11/21/19 1000  remdesivir 100 mg in  sodium chloride 0.9 % 100 mL IVPB     100 mg 200 mL/hr over 30 Minutes Intravenous Daily 11/20/19 0503 11/24/19 0951   11/20/19 0515  remdesivir 200 mg in sodium chloride 0.9% 250 mL IVPB     200 mg 580 mL/hr over 30 Minutes Intravenous Once 11/20/19 0500 11/20/19 0652        Objective: Vitals:   11/24/19 2055 11/24/19 2221 11/25/19 0437 11/25/19 0646  BP: (!) 146/61 (!) 154/58 (!) 165/66   Pulse: 76 76 75   Resp: 13 17 12    Temp: 97.6 F (36.4 C)  97.6 F (36.4 C)   TempSrc: Oral  Oral   SpO2: 95% 97% 97%   Weight:    73.1 kg  Height:        Intake/Output Summary (Last 24 hours) at 11/25/2019 1347 Last data filed at 11/25/2019 0730 Gross per 24 hour  Intake 605 ml  Output 0 ml  Net 605 ml   Filed Weights   11/23/19 0522 11/24/19 0530 11/25/19 0646  Weight: 72.5 kg 73.1 kg 73.1 kg    Examination:  Awake Alert, Oriented X 3, No new F.N deficits, Normal affect Symmetrical Chest wall movement, Good air movement bilaterally, CTAB RRR,No Gallops,Rubs or new Murmurs, No Parasternal Heave +ve B.Sounds, Abd Soft, No tenderness, No rebound - guarding or rigidity. No Cyanosis, Clubbing or edema, No new Rash or bruise      Data Reviewed: I have personally reviewed following labs and imaging studies  CBC: Recent Labs  Lab 11/20/19 0049 11/20/19 0515 11/21/19 1023 11/21/19 1023 11/22/19 0253 11/23/19 0229 11/23/19 1546 11/24/19 0500 11/25/19 0429  WBC 8.3   < > 8.7  --  10.0 9.3  --  10.5 13.6*  NEUTROABS 6.6  --   --   --   --   --   --   --   --   HGB 9.8*   < > 9.0*   < > 8.8* 8.5* 9.4* 9.5* 10.0*  HCT 31.2*   < > 29.8*   < > 27.8* 27.0* 29.2* 30.2* 31.7*  MCV 91.0   < > 91.7  --  89.7 89.7  --  90.4 88.5  PLT 296   < > 416*  --  397 420*  --  480* 567*   < > = values in this interval not displayed.   Basic Metabolic Panel: Recent Labs  Lab 11/21/19 0226 11/22/19 0253 11/23/19 0229 11/24/19 0500 11/25/19 0429  NA 136 137 135 133* 134*  K 4.7 4.7 4.5  4.8 4.3  CL 102 101 98 96* 99  CO2 25 26 25 24 23   GLUCOSE 228* 266* 235* 336* 292*  BUN 41* 47* 61* 71* 83*  CREATININE 2.19* 2.47* 2.89* 3.36* 3.51*  CALCIUM 8.4* 8.5* 8.3* 8.3* 8.4*  MG  --  2.0 2.0  --   --   PHOS  --  4.4 4.5  --   --    GFR: Estimated Creatinine Clearance: 12.9 mL/min (A) (by C-G formula based on SCr of 3.51 mg/dL (H)). Liver Function Tests: Recent Labs  Lab 11/21/19 0226 11/22/19 0253 11/23/19 0229 11/24/19 0500 11/25/19 0429  AST 30 38 34 36 32  ALT 19 20 20 21 22   ALKPHOS 53 56 51 54 58  BILITOT 0.4 0.4 0.4 0.5 0.9  PROT 5.2* 5.4* 5.3* 5.4* 5.6*  ALBUMIN 2.5* 2.6* 2.4*  2.5* 2.6*   No results for input(s): LIPASE, AMYLASE in the last 168 hours. No results for input(s): AMMONIA in the last 168 hours. Coagulation Profile: No results for input(s): INR, PROTIME in the last 168 hours. Cardiac Enzymes: Recent Labs  Lab 11/20/19 0515  CKTOTAL 345*  CKMB 6.1*   BNP (last 3 results) Recent Labs    08/18/19 1035  PROBNP 1,864*   HbA1C: No results for input(s): HGBA1C in the last 72 hours. CBG: Recent Labs  Lab 11/24/19 1104 11/24/19 1641 11/24/19 2205 11/25/19 0806 11/25/19 1147  GLUCAP 441* 280* 293* 305* 154*   Lipid Profile: No results for input(s): CHOL, HDL, LDLCALC, TRIG, CHOLHDL, LDLDIRECT in the last 72 hours. Thyroid Function Tests: No results for input(s): TSH, T4TOTAL, FREET4, T3FREE, THYROIDAB in the last 72 hours. Anemia Panel: Recent Labs    11/25/19 0934  TIBC 244*  IRON 35   Sepsis Labs: Recent Labs  Lab 11/20/19 0515 11/22/19 0253  LATICACIDVEN 2.2* 1.3    Recent Results (from the past 240 hour(s))  Respiratory Panel by RT PCR (Flu A&B, Covid) - Nasopharyngeal Swab     Status: Abnormal   Collection Time: 11/20/19  2:43 AM   Specimen: Nasopharyngeal Swab  Result Value Ref Range Status   SARS Coronavirus 2 by RT PCR POSITIVE (A) NEGATIVE Final    Comment: RESULT CALLED TO, READ BACK BY AND VERIFIED WITH: L  VENEGAS RN 11/20/19 0401  JDW (NOTE) SARS-CoV-2 target nucleic acids are DETECTED. SARS-CoV-2 RNA is generally detectable in upper respiratory specimens  during the acute phase of infection. Positive results are indicative of the presence of the identified virus, but do not rule out bacterial infection or co-infection with other pathogens not detected by the test. Clinical correlation with patient history and other diagnostic information is necessary to determine patient infection status. The expected result is Negative. Fact Sheet for Patients:  PinkCheek.be Fact Sheet for Healthcare Providers: GravelBags.it This test is not yet approved or cleared by the [place] FDA and  has been authorized for detection and/or diagnosis of SARS-CoV-2 by FDA under an Emergency Use Authorization (EUA).  This EUA will remain in effect (meaning this test can be used) for th e duration of  the COVID-19 declaration under Section 564(b)(1) of the Act, 21 U.S.C. section 360bbb-3(b)(1), unless the authorization is terminated or revoked sooner.    Influenza A by PCR NEGATIVE NEGATIVE Final   Influenza B by PCR NEGATIVE NEGATIVE Final    Comment: (NOTE) The Xpert Xpress SARS-CoV-2/FLU/RSV assay is intended as an aid in  the diagnosis of influenza from Nasopharyngeal swab specimens and  should not be used as a sole basis for treatment. Nasal washings and  aspirates are unacceptable for Xpert Xpress SARS-CoV-2/FLU/RSV  testing. Fact Sheet for Patients: PinkCheek.be Fact Sheet for Healthcare Providers: GravelBags.it This test is not yet approved or cleared by the [place] FDA and  has been authorized for detection and/or diagnosis of SARS-CoV-2 by  FDA under an Emergency Use Authorization (EUA). This EUA will remain  in effect (meaning this test can be used) for the duration of the    Covid-19 declaration under Section 564(b)(1) of the Act, 21  U.S.C. section 360bbb-3(b)(1), unless the authorization is  terminated or revoked. Performed at [facility], [place] [address], [place], [place] [address]      Radiology Studies: US RENAL  Result Date: 11/25/2019 CLINICAL DATA:  Acute kidney injury, history breast cancer, CHF, diabetes mellitus, hypertension  EXAM: RENAL / URINARY TRACT ULTRASOUND COMPLETE COMPARISON:  CT abdomen and pelvis 11/20/2017 FINDINGS: Right Kidney: Renal measurements: 11.9 x 4.8 x 6.2 cm = volume: 185 mL. Normal cortical thickness. Minimally increased cortical echogenicity. No mass, hydronephrosis or shadowing calcification. Left Kidney: Renal measurements: 11.4 x 5.2 x 5.3 cm = volume: 166 mL. Normal cortical thickness. Minimally increased cortical echogenicity. No mass, hydronephrosis, or shadowing calcification. Bladder: Appears normal for degree of bladder distention. Other: N/A IMPRESSION: Suspected medical renal disease changes of both kidneys. No evidence of renal mass or hydronephrosis. Electronically Signed   By: Lavonia Dana M.D.   On: 11/25/2019 09:57    Scheduled Meds: . amLODipine  5 mg Oral Once  . amLODipine  5 mg Oral Daily  . atorvastatin  10 mg Oral Daily  . bacitracin   Topical BID  . carvedilol  6.25 mg Oral BID  . fenofibrate  160 mg Oral Daily  . heparin injection (subcutaneous)  5,000 Units Subcutaneous Q8H  . insulin aspart  0-15 Units Subcutaneous TID WC  . insulin aspart  0-5 Units Subcutaneous QHS  . insulin glargine  10 Units Subcutaneous Daily  . insulin glargine  4 Units Subcutaneous QHS  . iron polysaccharides  150 mg Oral BID  . levothyroxine  112 mcg Oral Once per day on Mon Tue Wed Thu Fri Sat  . pantoprazole  40 mg Oral BID  . [START ON 11/26/2019] Vitamin D (Ergocalciferol)  50,000 Units Oral Once per day on Sun Wed  . zinc sulfate  220 mg Oral Daily   Continuous Infusions:    LOS: 5 days     Phillips Climes, MD [facility]   If 7PM-7AM, please contact night-coverage

## 2019-11-25 NOTE — Progress Notes (Signed)
Hypoglycemic Event  CBG: 68  Treatment: 4 oz juice/soda  Symptoms: None  Follow-up CBG: Time:2246 *CBG Result:70  Possible Reasons for Event: Unknown  Comments/MD notified:Melanie Asencion Islam Montey Hora

## 2019-11-26 LAB — BASIC METABOLIC PANEL
Anion gap: 10 (ref 5–15)
BUN: 81 mg/dL — ABNORMAL HIGH (ref 8–23)
CO2: 24 mmol/L (ref 22–32)
Calcium: 8 mg/dL — ABNORMAL LOW (ref 8.9–10.3)
Chloride: 103 mmol/L (ref 98–111)
Creatinine, Ser: 3.18 mg/dL — ABNORMAL HIGH (ref 0.44–1.00)
GFR calc Af Amer: 16 mL/min — ABNORMAL LOW (ref >60)
GFR calc non Af Amer: 13 mL/min — ABNORMAL LOW (ref >60)
Glucose, Bld: 64 mg/dL — ABNORMAL LOW (ref 70–99)
Potassium: 4.2 mmol/L (ref 3.5–5.1)
Sodium: 137 mmol/L (ref 135–145)

## 2019-11-26 LAB — CBC
HCT: 29.3 % — ABNORMAL LOW (ref 36.0–46.0)
Hemoglobin: 9.4 g/dL — ABNORMAL LOW (ref 12.0–15.0)
MCH: 28.8 pg (ref 26.0–34.0)
MCHC: 32.1 g/dL (ref 30.0–36.0)
MCV: 89.9 fL (ref 80.0–100.0)
Platelets: 474 10*3/uL — ABNORMAL HIGH (ref 150–400)
RBC: 3.26 MIL/uL — ABNORMAL LOW (ref 3.87–5.11)
RDW: 16.6 % — ABNORMAL HIGH (ref 11.5–15.5)
WBC: 12 10*3/uL — ABNORMAL HIGH (ref 4.0–10.5)
nRBC: 0 % (ref 0.0–0.2)

## 2019-11-26 LAB — GLUCOSE, CAPILLARY
Glucose-Capillary: 45 mg/dL — ABNORMAL LOW (ref 70–99)
Glucose-Capillary: 69 mg/dL — ABNORMAL LOW (ref 70–99)
Glucose-Capillary: 85 mg/dL (ref 70–99)
Glucose-Capillary: 124 mg/dL — ABNORMAL HIGH (ref 70–99)
Glucose-Capillary: 141 mg/dL — ABNORMAL HIGH (ref 70–99)
Glucose-Capillary: 94 mg/dL (ref 70–99)
Glucose-Capillary: 75 mg/dL (ref 70–99)
Glucose-Capillary: 158 mg/dL — ABNORMAL HIGH (ref 70–99)

## 2019-11-26 MED ORDER — INSULIN GLARGINE 100 UNIT/ML ~~LOC~~ SOLN
6.0000 [IU] | Freq: Every day | SUBCUTANEOUS | Status: DC
  Administered 2019-11-27: 10:00:00 6 [IU] via SUBCUTANEOUS
  Filled 2019-11-26: qty 0.06

## 2019-11-26 MED ORDER — PHENOL 1.4 % MT LIQD
1.0000 | OROMUCOSAL | Status: DC | PRN
  Administered 2019-11-26: 1 via OROMUCOSAL
  Filled 2019-11-26: qty 177

## 2019-11-26 NOTE — Progress Notes (Signed)
Inpatient Diabetes Program Recommendations  AACE/ADA: New Consensus Statement on Inpatient Glycemic Control (2015)  Target Ranges:  Prepandial:   less than 140 mg/dL      Peak postprandial:   less than 180 mg/dL (1-2 hours)      Critically ill patients:  140 - 180 mg/dL   Lab Results  Component Value Date   GLUCAP 124 (H) 11/26/2019   HGBA1C 9.0 (H) 11/20/2019    Review of Glycemic Control Results for Elizabeth Olsen, Elizabeth Olsen (MRN [id number]) as of 11/26/2019 09:45  Ref. Range 11/26/2019 05:12 11/26/2019 05:39 11/26/2019 06:08 11/26/2019 07:43  Glucose-Capillary Latest Ref Range: 70 - 99 mg/dL 45 (L) 69 (L) 85 124 (H)   Diabetes history:DM 2 Outpatient Diabetes medications:Lantus 6 units Qam and 4 units qpm, Humalog 5-20 units tid Current orders for Inpatient glycemic control: Lantus 10 units Qam  Novolog 0-15 units tid + hs  Inpatient Diabetes Program Recommendations:   Noted hypoglycemia this AM of 45 mg/dL and subsequent order changes. In agreement. Appears patient has refused AM dose of Lantus. Decadron has been discontinued on 1/25. Will follow.   Thanks, Bronson Curb, MSN, RNC-OB Diabetes Coordinator [phone] (8a-5p)

## 2019-11-26 NOTE — Progress Notes (Signed)
PROGRESS NOTE    Elizabeth Olsen  WFU:[id number] DOB: 08-31-1941 DOA: 11/20/2019 PCP: [name], MD  Brief Narrative:   Old lady prior history of GERD, history of remote breast cancer, hypothyroidism, hypertension, hyperlipidemia, type 2 diabetes mellitus, diastolic heart failure presents with shortness of breath for 4 days and cough.  On arrival to ED she was tested positive for Covid. Her hospital course was complicated by a drop in hemoglobin of 1.2 g and her stool was found to be positive for blood.  GI consulted recommended no EGD or colonoscopy unless patient becomes symptomatic with hematemesis or melena.  Recommend outpatient follow-up.  Further complication with AKI at which time nephrology was consulted recommended watchful observation for now and avoid nephrotoxins and hypotension.  Patient also underwent VQ scan which was negative for PE and venous duplex was negative for DVT.  Currently we are waiting for her renal parameters to improve so she can be discharged home. Earlier this morning she became hypoglycemic with CBG in 45 I adjusted her insulin dosage.    Assessment & Plan:   Active Problems:   Diabetes mellitus ([place])   Hyperlipidemia   Anemia   Essential hypertension   Dyspnea   Acute diastolic CHF (congestive heart failure) ([place])   COVID-19 virus infection   COVID-19 viral illness Patient completed remdesivir and steroids. Recommend to continue with incentive spirometer and flutter valve and ambulate as tolerated. VQ scan did not show any PE Venous duplex of the lower extremities is negative for DVT.    Acute on chronic diastolic heart failure Recent echo shows grade 2 diastolic dysfunction. She was initially started on Lasix but then it was held for worsening renal parameters Patient is currently euvolemic.    Acute kidney injury probably secondary to dehydration, undocumented low blood pressures on admission.  Baseline creatinine is around 1.7.   Creatinine peaked at 3.5. Currently creatinine is improving to 3.1. Renal ultrasound does not show any hydronephrosis but has some chronic kidney disease.    Diabetes mellitus Brittle diabetes mellitus. Hemoglobin A1c is 9. Patient was initially on 10 units during the day and 4 units at night and because of hypoglycemic episode earlier this morning night dose was discontinued and her a.m. dose of Lantus was decreased to 6 units daily. Continue with sliding scale insulin.   Essential hypertension Blood pressure parameters much controlled.   Heme positive stools GI consulted recommended no EGD or colonoscopy unless patient becomes symptomatic with hematemesis or melena Recommended to continue with Protonix twice daily and follow-up as outpatient.        DVT prophylaxis: Heparin Code Status: Full code Family Communication: None at bedside Disposition Plan:  . Patient came from: Home           . Anticipated d/c place: Home health once renal parameters improved . Barriers to d/c OR conditions which need to be met to effect a safe d/c: Clinical improvement and once renal parameters improve and no hypoglycemic episodes.   Consultants:   Nephrology.    Procedures: none.    Antimicrobials: none.    Subjective: No chest pain or sob, nausea, vomiting or abdominal pain.   Objective: Vitals:   11/25/19 0646 11/25/19 1357 11/25/19 2214 11/26/19 0506  BP:  (!) 113/43 (!) 128/56 (!) 150/64  Pulse:  74 70 68  Resp:  (!) _0 Temp:  98 F (36.7 C) 97.8 F (36.6 C) (!) 97.5 F (36.4 C)  TempSrc:  Oral Oral Oral  SpO2:  98% 98% 97%  Weight: 73.1 kg   73.1 kg  Height:        Intake/Output Summary (Last 24 hours) at 11/26/2019 1632 Last data filed at 11/26/2019 1308 Gross per 24 hour  Intake 1930 ml  Output 501 ml  Net 1429 ml   Filed Weights   11/24/19 0530 11/25/19 0646 11/26/19 0506  Weight: 73.1 kg 73.1 kg 73.1 kg    Examination:  General exam: Appears  calm and comfortable  Respiratory system: Clear to auscultation. Respiratory effort normal. Cardiovascular system: S1 & S2 heard, RRR. No JVD, . No pedal edema. Gastrointestinal system: Abdomen is nondistended, soft and nontender.Normal bowel sounds heard. Central nervous system: Alert and oriented. No focal neurological deficits. Extremities: Symmetric 5 x 5 power. Skin: No rashes, lesions or ulcers Psychiatry:  Mood & affect appropriate.     Data Reviewed: I have personally reviewed following labs and imaging studies  CBC: Recent Labs  Lab 11/20/19 0049 11/20/19 0515 11/22/19 0253 11/22/19 0253 11/23/19 0229 11/23/19 1546 11/24/19 0500 11/25/19 0429 11/26/19 0224  WBC 8.3   < > 10.0  --  9.3  --  10.5 13.6* 12.0*  NEUTROABS 6.6  --   --   --   --   --   --   --   --   HGB 9.8*   < > 8.8*   < > 8.5* 9.4* 9.5* 10.0* 9.4*  HCT 31.2*   < > 27.8*   < > 27.0* 29.2* 30.2* 31.7* 29.3*  MCV 91.0   < > 89.7  --  89.7  --  90.4 88.5 89.9  PLT 296   < > 397  --  420*  --  480* 567* 474*   < > = values in this interval not displayed.   Basic Metabolic Panel: Recent Labs  Lab 11/22/19 0253 11/23/19 0229 11/24/19 0500 11/25/19 0429 11/26/19 0224  NA 137 135 133* 134* 137  K 4.7 4.5 4.8 4.3 4.2  CL 101 98 96* 99 103  CO2 _0 GLUCOSE 266* 235* 336* 292* 64*  BUN 47* 61* 71* 83* 81*  CREATININE 2.47* 2.89* 3.36* 3.51* 3.18*  CALCIUM 8.5* 8.3* 8.3* 8.4* 8.0*  MG 2.0 2.0  --   --   --   PHOS 4.4 4.5  --   --   --    GFR: Estimated Creatinine Clearance: 14.2 mL/min (A) (by C-G formula based on SCr of 3.18 mg/dL (H)). Liver Function Tests: Recent Labs  Lab 11/21/19 0226 11/22/19 0253 11/23/19 0229 11/24/19 0500 11/25/19 0429  AST 30 38 34 36 32  ALT _1 ALKPHOS 53 56 51 54 58  BILITOT 0.4 0.4 0.4 0.5 0.9  PROT 5.2* 5.4* 5.3* 5.4* 5.6*  ALBUMIN 2.5* 2.6* 2.4* 2.5* 2.6*   No results for input(s): LIPASE, AMYLASE in the last 168 hours. No results  for input(s): AMMONIA in the last 168 hours. Coagulation Profile: No results for input(s): INR, PROTIME in the last 168 hours. Cardiac Enzymes: Recent Labs  Lab 11/20/19 0515  CKTOTAL 345*  CKMB 6.1*   BNP (last 3 results) Recent Labs    08/18/19 1035  PROBNP 1,864*   HbA1C: No results for input(s): HGBA1C in the last 72 hours. CBG: Recent Labs  Lab 11/26/19 0539 11/26/19 0608 11/26/19 0743 11/26/19 1155 11/26/19 1558  GLUCAP 69* 85 124* 141* 94   Lipid Profile: No results for input(s): CHOL,  HDL, LDLCALC, TRIG, CHOLHDL, LDLDIRECT in the last 72 hours. Thyroid Function Tests: No results for input(s): TSH, T4TOTAL, FREET4, T3FREE, THYROIDAB in the last 72 hours. Anemia Panel: Recent Labs    11/25/19 0934  TIBC 244*  IRON 35   Sepsis Labs: Recent Labs  Lab 11/20/19 0515 11/22/19 0253  LATICACIDVEN 2.2* 1.3    Recent Results (from the past 240 hour(s))  Respiratory Panel by RT PCR (Flu A&B, Covid) - Nasopharyngeal Swab     Status: Abnormal   Collection Time: 11/20/19  2:43 AM   Specimen: Nasopharyngeal Swab  Result Value Ref Range Status   SARS Coronavirus 2 by RT PCR POSITIVE (A) NEGATIVE Final    Comment: RESULT CALLED TO, READ BACK BY AND VERIFIED WITH: L VENEGAS RN 11/20/19 0401  JDW (NOTE) SARS-CoV-2 target nucleic acids are DETECTED. SARS-CoV-2 RNA is generally detectable in upper respiratory specimens  during the acute phase of infection. Positive results are indicative of the presence of the identified virus, but do not rule out bacterial infection or co-infection with other pathogens not detected by the test. Clinical correlation with patient history and other diagnostic information is necessary to determine patient infection status. The expected result is Negative. Fact Sheet for Patients:  PinkCheek.be Fact Sheet for Healthcare Providers: GravelBags.it This test is not yet approved or  cleared by the [place] FDA and  has been authorized for detection and/or diagnosis of SARS-CoV-2 by FDA under an Emergency Use Authorization (EUA).  This EUA will remain in effect (meaning this test can be used) for th e duration of  the COVID-19 declaration under Section 564(b)(1) of the Act, 21 U.S.C. section 360bbb-3(b)(1), unless the authorization is terminated or revoked sooner.    Influenza A by PCR NEGATIVE NEGATIVE Final   Influenza B by PCR NEGATIVE NEGATIVE Final    Comment: (NOTE) The Xpert Xpress SARS-CoV-2/FLU/RSV assay is intended as an aid in  the diagnosis of influenza from Nasopharyngeal swab specimens and  should not be used as a sole basis for treatment. Nasal washings and  aspirates are unacceptable for Xpert Xpress SARS-CoV-2/FLU/RSV  testing. Fact Sheet for Patients: PinkCheek.be Fact Sheet for Healthcare Providers: GravelBags.it This test is not yet approved or cleared by the [place] FDA and  has been authorized for detection and/or diagnosis of SARS-CoV-2 by  FDA under an Emergency Use Authorization (EUA). This EUA will remain  in effect (meaning this test can be used) for the duration of the  Covid-19 declaration under Section 564(b)(1) of the Act, 21  U.S.C. section 360bbb-3(b)(1), unless the authorization is  terminated or revoked. Performed at [facility], [place] [address], [place], [place] [address]          Radiology Studies: US RENAL  Result Date: 11/25/2019 CLINICAL DATA:  Acute kidney injury, history breast cancer, CHF, diabetes mellitus, hypertension EXAM: RENAL / URINARY TRACT ULTRASOUND COMPLETE COMPARISON:  CT abdomen and pelvis 11/20/2017 FINDINGS: Right Kidney: Renal measurements: 11.9 x 4.8 x 6.2 cm = volume: 185 mL. Normal cortical thickness. Minimally increased cortical echogenicity. No mass, hydronephrosis or shadowing calcification. Left Kidney: Renal  measurements: 11.4 x 5.2 x 5.3 cm = volume: 166 mL. Normal cortical thickness. Minimally increased cortical echogenicity. No mass, hydronephrosis, or shadowing calcification. Bladder: Appears normal for degree of bladder distention. Other: N/A IMPRESSION: Suspected medical renal disease changes of both kidneys. No evidence of renal mass or hydronephrosis. Electronically Signed   By: Lavonia Dana M.D.   On: 11/25/2019 09:57  Scheduled Meds: . amLODipine  5 mg Oral Daily  . atorvastatin  10 mg Oral Daily  . bacitracin   Topical BID  . carvedilol  6.25 mg Oral BID  . heparin injection (subcutaneous)  5,000 Units Subcutaneous Q8H  . insulin aspart  0-15 Units Subcutaneous TID WC  . insulin aspart  0-5 Units Subcutaneous QHS  . [START ON 11/27/2019] insulin glargine  6 Units Subcutaneous Daily  . iron polysaccharides  150 mg Oral BID  . levothyroxine  112 mcg Oral Once per day on Mon Tue Wed Thu Fri Sat  . pantoprazole  40 mg Oral BID  . Vitamin D (Ergocalciferol)  50,000 Units Oral Once per day on Sun Wed  . zinc sulfate  220 mg Oral Daily   Continuous Infusions:   LOS: 6 days    Time spent: 30 minutes.     Hosie Poisson, MD [facility]   To contact the attending provider between 7A-7P or the covering provider during after hours 7P-7A, please log into the web site [email or web address] and access using universal [place] password for that web site. If you do not have the password, please call the hospital operator.  11/26/2019, 4:32 PM

## 2019-11-26 NOTE — Progress Notes (Signed)
Hypoglycemic Event  CBG: 45  Treatment: 4 oz juice/soda  Symptoms: Sweaty  Follow-up CBG: Time:0539 CBG Result:69  Retreated with another 4 oz juice and peanut butter and graham crackers  Follow-up CBG: ZCHY:[id number] CBG Result:85   Possible Reasons for Event: Insulin therapy per patient   Comments/MD notified: Lonny Prude Montey Hora

## 2019-11-26 NOTE — Plan of Care (Signed)

## 2019-11-26 NOTE — Progress Notes (Signed)
[facility] ROUNDING NOTE   Subjective:   This is a 79 year old lady with history of diabetes, hypertension, hyperlipidemia, hypothyroidism, diastolic dysfunction with an ejection fraction of 55-60% 11/20/2019.  She is chronic renal insufficiency with a baseline serum creatinine 1.7 mg/dL.  She was admitted 11/20/2019 with Covid pneumonia urine output has been good.  She had 300 cc of urine 11/24/2019 her weight is increased mildly from 70.3 kg on admission 73.1 kg 11/25/2019  Blood pressure 150/64 pulse 68 temperature 97.5 O2 sats 99% on room air  Sodium 137 potassium 4.2 chloride 103 CO2 24 BUN 81 creatinine one3.18 glucose 64 WBC 12 hemoglobin 9.4 platelets  474  Amlodipine 5 mg daily, atorvastatin 10 mg daily, Coreg 6.25 mg twice daily, fenofibrate 160 mg daily insulin sliding scale, NovoLog 8 units 3 times daily, glargine 16 units a.m. and 4 units p.m. dexamethasone 6 mg 24 hours  Objective:  Vital signs in last 24 hours:  Temp:  [97.5 F (36.4 C)-98 F (36.7 C)] 97.5 F (36.4 C) (01/27 0506) Pulse Rate:  [68-74] 68 (01/27 0506) Resp:  [16-24] 16 (01/27 0506) BP: (113-150)/(43-64) 150/64 (01/27 0506) SpO2:  [97 %-98 %] 97 % (01/27 0506) FiO2 (%):  [80 %] 80 % (01/27 0506) Weight:  [73.1 kg] 73.1 kg (01/27 0506)  Weight change: 0 kg Filed Weights   11/24/19 0530 11/25/19 0646 11/26/19 0506  Weight: 73.1 kg 73.1 kg 73.1 kg    Intake/Output: I/O last 3 completed shifts: In: 1685 [P.O.:1680; I.V.:5] Out: 0    Intake/Output this shift:  Total I/O In: 600 [P.O.:600] Out: -   General: Alert non distressed HEENT: Normocephalic atraumatic  Normal appearance Eyes: EOMI PERRLA Neck: supple no JVP  Heart:RRR no Mr g  Lungs: Clear no wheezes rales Abdomen: soft non tender bs normal  Extremities: no edema  Skin: normal no rash  Ulcers  Neuro: intact and non focal   Basic Metabolic Panel: Recent Labs  Lab 11/22/19 0253 11/22/19 0253 11/23/19 0229  11/23/19 0229 11/24/19 0500 11/25/19 0429 11/26/19 0224  NA 137  --  135  --  133* 134* 137  K 4.7  --  4.5  --  4.8 4.3 4.2  CL 101  --  98  --  96* 99 103  CO2 26  --  25  --  24 23 24   GLUCOSE 266*  --  235*  --  336* 292* 64*  BUN 47*  --  61*  --  71* 83* 81*  CREATININE 2.47*  --  2.89*  --  3.36* 3.51* 3.18*  CALCIUM 8.5*   < > 8.3*   < > 8.3* 8.4* 8.0*  MG 2.0  --  2.0  --   --   --   --   PHOS 4.4  --  4.5  --   --   --   --    < > = values in this interval not displayed.    Liver Function Tests: Recent Labs  Lab 11/21/19 0226 11/22/19 0253 11/23/19 0229 11/24/19 0500 11/25/19 0429  AST 30 38 34 36 32  ALT 19 20 20 21 22   ALKPHOS 53 56 51 54 58  BILITOT 0.4 0.4 0.4 0.5 0.9  PROT 5.2* 5.4* 5.3* 5.4* 5.6*  ALBUMIN 2.5* 2.6* 2.4* 2.5* 2.6*   No results for input(s): LIPASE, AMYLASE in the last 168 hours. No results for input(s): AMMONIA in the last 168 hours.  CBC: Recent Labs  Lab 11/20/19 0049  11/20/19 0515 11/22/19 0253 11/22/19 0253 11/23/19 0229 11/23/19 1546 11/24/19 0500 11/25/19 0429 11/26/19 0224  WBC 8.3   < > 10.0  --  9.3  --  10.5 13.6* 12.0*  NEUTROABS 6.6  --   --   --   --   --   --   --   --   HGB 9.8*   < > 8.8*   < > 8.5* 9.4* 9.5* 10.0* 9.4*  HCT 31.2*   < > 27.8*   < > 27.0* 29.2* 30.2* 31.7* 29.3*  MCV 91.0   < > 89.7  --  89.7  --  90.4 88.5 89.9  PLT 296   < > 397  --  420*  --  480* 567* 474*   < > = values in this interval not displayed.    Cardiac Enzymes: Recent Labs  Lab 11/20/19 0515  CKTOTAL 345*  CKMB 6.1*    BNP: Invalid input(s): POCBNP  CBG: Recent Labs  Lab 11/25/19 2246 11/26/19 0512 11/26/19 0539 11/26/19 0608 11/26/19 0743  GLUCAP 70 45* 69* 85 124*    Microbiology: Results for orders placed or performed during the hospital encounter of 11/20/19  Respiratory Panel by RT PCR (Flu A&B, Covid) - Nasopharyngeal Swab     Status: Abnormal   Collection Time: 11/20/19  2:43 AM   Specimen:  Nasopharyngeal Swab  Result Value Ref Range Status   SARS Coronavirus 2 by RT PCR POSITIVE (A) NEGATIVE Final    Comment: RESULT CALLED TO, READ BACK BY AND VERIFIED WITH: L VENEGAS RN 11/20/19 0401  JDW (NOTE) SARS-CoV-2 target nucleic acids are DETECTED. SARS-CoV-2 RNA is generally detectable in upper respiratory specimens  during the acute phase of infection. Positive results are indicative of the presence of the identified virus, but do not rule out bacterial infection or co-infection with other pathogens not detected by the test. Clinical correlation with patient history and other diagnostic information is necessary to determine patient infection status. The expected result is Negative. Fact Sheet for Patients:  PinkCheek.be Fact Sheet for Healthcare Providers: GravelBags.it This test is not yet approved or cleared by the [place] FDA and  has been authorized for detection and/or diagnosis of SARS-CoV-2 by FDA under an Emergency Use Authorization (EUA).  This EUA will remain in effect (meaning this test can be used) for th e duration of  the COVID-19 declaration under Section 564(b)(1) of the Act, 21 U.S.C. section 360bbb-3(b)(1), unless the authorization is terminated or revoked sooner.    Influenza A by PCR NEGATIVE NEGATIVE Final   Influenza B by PCR NEGATIVE NEGATIVE Final    Comment: (NOTE) The Xpert Xpress SARS-CoV-2/FLU/RSV assay is intended as an aid in  the diagnosis of influenza from Nasopharyngeal swab specimens and  should not be used as a sole basis for treatment. Nasal washings and  aspirates are unacceptable for Xpert Xpress SARS-CoV-2/FLU/RSV  testing. Fact Sheet for Patients: PinkCheek.be Fact Sheet for Healthcare Providers: GravelBags.it This test is not yet approved or cleared by the [place] FDA and  has been authorized for  detection and/or diagnosis of SARS-CoV-2 by  FDA under an Emergency Use Authorization (EUA). This EUA will remain  in effect (meaning this test can be used) for the duration of the  Covid-19 declaration under Section 564(b)(1) of the Act, 21  U.S.C. section 360bbb-3(b)(1), unless the authorization is  terminated or revoked. Performed at [facility], [place] [address] [place], [place] [address]  Coagulation Studies: No results for input(s): LABPROT, INR in the last 72 hours.  Urinalysis: No results for input(s): COLORURINE, LABSPEC, PHURINE, GLUCOSEU, HGBUR, BILIRUBINUR, KETONESUR, PROTEINUR, UROBILINOGEN, NITRITE, LEUKOCYTESUR in the last 72 hours.  Invalid input(s): APPERANCEUR    Imaging: US RENAL  Result Date: 11/25/2019 CLINICAL DATA:  Acute kidney injury, history breast cancer, CHF, diabetes mellitus, hypertension EXAM: RENAL / URINARY TRACT ULTRASOUND COMPLETE COMPARISON:  CT abdomen and pelvis 11/20/2017 FINDINGS: Right Kidney: Renal measurements: 11.9 x 4.8 x 6.2 cm = volume: 185 mL. Normal cortical thickness. Minimally increased cortical echogenicity. No mass, hydronephrosis or shadowing calcification. Left Kidney: Renal measurements: 11.4 x 5.2 x 5.3 cm = volume: 166 mL. Normal cortical thickness. Minimally increased cortical echogenicity. No mass, hydronephrosis, or shadowing calcification. Bladder: Appears normal for degree of bladder distention. Other: N/A IMPRESSION: Suspected medical renal disease changes of both kidneys. No evidence of renal mass or hydronephrosis. Electronically Signed   By: Lavonia Dana M.D.   On: 11/25/2019 09:57     Medications:    . amLODipine  5 mg Oral Once  . amLODipine  5 mg Oral Daily  . atorvastatin  10 mg Oral Daily  . bacitracin   Topical BID  . carvedilol  6.25 mg Oral BID  . fenofibrate  160 mg Oral Daily  . heparin injection (subcutaneous)  5,000 Units Subcutaneous Q8H  . insulin aspart  0-15 Units Subcutaneous TID WC  .  insulin aspart  0-5 Units Subcutaneous QHS  . insulin glargine  10 Units Subcutaneous Daily  . iron polysaccharides  150 mg Oral BID  . levothyroxine  112 mcg Oral Once per day on Mon Tue Wed Thu Fri Sat  . pantoprazole  40 mg Oral BID  . Vitamin D (Ergocalciferol)  50,000 Units Oral Once per day on Sun Wed  . zinc sulfate  220 mg Oral Daily   diphenhydrAMINE, hydrALAZINE, loratadine, menthol-cetylpyridinium, phenol  Assessment/ Plan:   Acute kidney injury without hydronephrosis plan urine sediment nondistressed AIN or ATN patient was on ARB prior to admission this was stopped.  There may be some ischemic ATN that was present on admission with undocumented low blood pressures.  I would continue to avoid nephrotoxins.  I do not see a reason to discontinue Protonix at this time.  Creatinine appears to be improving as decreased to 3.18.  We will follow 1 more day to evaluate trend.  If continues to fall back to baseline we will sign off 11/27/2019  Hypertension/volume amlodipine has been started 5 mg daily agree with this.  Covid pneumonia appears to be stable on dexamethasone appears to be completed 11/25/2019 and completed course of remdesivir  History of GI blood loss with drop in hemoglobin we will check iron studies  Diabetes mellitus as per primary  Hypothyroidism as per primary   LOS: [place] @TODAY @9 :15 AM

## 2019-11-27 DIAGNOSIS — I10 Essential (primary) hypertension: Secondary | ICD-10-CM

## 2019-11-27 LAB — BASIC METABOLIC PANEL
Anion gap: 5 (ref 5–15)
BUN: 66 mg/dL — ABNORMAL HIGH (ref 8–23)
CO2: 23 mmol/L (ref 22–32)
Calcium: 7.7 mg/dL — ABNORMAL LOW (ref 8.9–10.3)
Chloride: 103 mmol/L (ref 98–111)
Creatinine, Ser: 3.18 mg/dL — ABNORMAL HIGH (ref 0.44–1.00)
GFR calc Af Amer: 16 mL/min — ABNORMAL LOW (ref >60)
GFR calc non Af Amer: 13 mL/min — ABNORMAL LOW (ref >60)
Glucose, Bld: 366 mg/dL — ABNORMAL HIGH (ref 70–99)
Potassium: 4.9 mmol/L (ref 3.5–5.1)
Sodium: 131 mmol/L — ABNORMAL LOW (ref 135–145)

## 2019-11-27 LAB — GLUCOSE, CAPILLARY
Glucose-Capillary: 370 mg/dL — ABNORMAL HIGH (ref 70–99)
Glucose-Capillary: 276 mg/dL — ABNORMAL HIGH (ref 70–99)

## 2019-11-27 MED ORDER — TRAMADOL HCL 50 MG PO TABS
50.0000 mg | ORAL_TABLET | Freq: Two times a day (BID) | ORAL | Status: DC | PRN
  Filled 2019-11-27: qty 1

## 2019-11-27 MED ORDER — ZINC SULFATE 220 (50 ZN) MG PO CAPS: 220.0000 mg | ORAL_CAPSULE | Freq: Every day | ORAL | Status: DC

## 2019-11-27 MED ORDER — PANTOPRAZOLE SODIUM 40 MG PO TBEC: 40.0000 mg | DELAYED_RELEASE_TABLET | Freq: Two times a day (BID) | ORAL | 1 refills | Status: DC

## 2019-11-27 MED ORDER — FUROSEMIDE 40 MG PO TABS
40.0000 mg | ORAL_TABLET | Freq: Every day | ORAL | Status: DC
  Administered 2019-11-27: 10:00:00 40 mg via ORAL
  Filled 2019-11-27: qty 1

## 2019-11-27 MED ORDER — INSULIN GLARGINE 100 UNIT/ML ~~LOC~~ SOLN
4.0000 [IU] | Freq: Every day | SUBCUTANEOUS | Status: DC
  Filled 2019-11-27: qty 0.04

## 2019-11-27 NOTE — Care Management (Signed)
Pt determined appropriate for discharge to home today.  Pt in agreement for COVID at Home program - CM emailed referral to program.  Pt denied barriers with discharging home.  Pt has PCP and denied barriers with paying for medications.  NO other outstanding TOC needs  - CM signing off

## 2019-11-27 NOTE — Progress Notes (Signed)
[facility] ROUNDING NOTE   Subjective:   This is a 79 year old lady with history of diabetes, hypertension, hyperlipidemia, hypothyroidism, diastolic dysfunction with an ejection fraction of 55-60% 11/20/2019.  She is chronic renal insufficiency with a baseline serum creatinine 1.7 mg/dL.  She was admitted 11/20/2019 with Covid pneumonia urine output has been good.  She had 300 cc of urine 11/24/2019 her weight is increased mildly from 70.3 kg on admission 73.1 kg 11/26/2019.  Urine output appears to be good at 501 cc.  Does continue to be positive for balance according to flowsheet.  Blood pressure 115/58 pulse 80 temperature 98.5  Sodium 137 potassium 4.2 chloride 103 CO2 24 BUN 81 creatinine one3.18 glucose 64 WBC 12 hemoglobin 9.4 platelets  474  Amlodipine 5 mg daily, atorvastatin 10 mg daily, Coreg 6.25 mg twice daily, fenofibrate 160 mg daily insulin sliding scale, NovoLog 8 units 3 times daily, glargine 16 units a.m. and 4 units p.m. dexamethasone 6 mg 24 hours  Objective:  Vital signs in last 24 hours:  Temp:  [98 F (36.7 C)-98.5 F (36.9 C)] 98.5 F (36.9 C) (01/28 0500) Pulse Rate:  [76-81] 81 (01/27 2150) Resp:  [14] 14 (01/27 2150) BP: (125-148)/(58-59) 148/59 (01/27 2150) SpO2:  [93 %-98 %] 96 % (01/27 2150)  Weight change:  Filed Weights   11/24/19 0530 11/25/19 0646 11/26/19 0506  Weight: 73.1 kg 73.1 kg 73.1 kg    Intake/Output: I/O last 3 completed shifts: In: 1940 [P.O.:1920; I.V.:20] Out: 501 [Urine:500; Stool:1]   Intake/Output this shift:  No intake/output data recorded.  General: Alert non distressed HEENT: Normocephalic atraumatic  Normal appearance Eyes: EOMI PERRLA Neck: supple no JVP  Heart:RRR no Mr g  Lungs: Clear no wheezes rales Abdomen: soft non tender bs normal  Extremities: no edema  Skin: normal no rash  Ulcers  Neuro: intact and non focal   Basic Metabolic Panel: Recent Labs  Lab 11/22/19 0253 11/22/19 0253  11/23/19 0229 11/23/19 0229 11/24/19 0500 11/25/19 0429 11/26/19 0224  NA 137  --  135  --  133* 134* 137  K 4.7  --  4.5  --  4.8 4.3 4.2  CL 101  --  98  --  96* 99 103  CO2 26  --  25  --  24 23 24   GLUCOSE 266*  --  235*  --  336* 292* 64*  BUN 47*  --  61*  --  71* 83* 81*  CREATININE 2.47*  --  2.89*  --  3.36* 3.51* 3.18*  CALCIUM 8.5*   < > 8.3*   < > 8.3* 8.4* 8.0*  MG 2.0  --  2.0  --   --   --   --   PHOS 4.4  --  4.5  --   --   --   --    < > = values in this interval not displayed.    Liver Function Tests: Recent Labs  Lab 11/21/19 0226 11/22/19 0253 11/23/19 0229 11/24/19 0500 11/25/19 0429  AST 30 38 34 36 32  ALT 19 20 20 21 22   ALKPHOS 53 56 51 54 58  BILITOT 0.4 0.4 0.4 0.5 0.9  PROT 5.2* 5.4* 5.3* 5.4* 5.6*  ALBUMIN 2.5* 2.6* 2.4* 2.5* 2.6*   No results for input(s): LIPASE, AMYLASE in the last 168 hours. No results for input(s): AMMONIA in the last 168 hours.  CBC: Recent Labs  Lab 11/22/19 0253 11/22/19 0253 11/23/19 0229 11/23/19 1546  11/24/19 0500 11/25/19 0429 11/26/19 0224  WBC 10.0  --  9.3  --  10.5 13.6* 12.0*  HGB 8.8*   < > 8.5* 9.4* 9.5* 10.0* 9.4*  HCT 27.8*   < > 27.0* 29.2* 30.2* 31.7* 29.3*  MCV 89.7  --  89.7  --  90.4 88.5 89.9  PLT 397  --  420*  --  480* 567* 474*   < > = values in this interval not displayed.    Cardiac Enzymes: No results for input(s): CKTOTAL, CKMB, CKMBINDEX, TROPONINI in the last 168 hours.  BNP: Invalid input(s): POCBNP  CBG: Recent Labs  Lab 11/26/19 1155 11/26/19 1558 11/26/19 1711 11/26/19 2010 11/27/19 0751  GLUCAP 141* 94 75 158* 370*    Microbiology: Results for orders placed or performed during the hospital encounter of 11/20/19  Respiratory Panel by RT PCR (Flu A&B, Covid) - Nasopharyngeal Swab     Status: Abnormal   Collection Time: 11/20/19  2:43 AM   Specimen: Nasopharyngeal Swab  Result Value Ref Range Status   SARS Coronavirus 2 by RT PCR POSITIVE (A) NEGATIVE Final     Comment: RESULT CALLED TO, READ BACK BY AND VERIFIED WITH: L VENEGAS RN 11/20/19 0401  JDW (NOTE) SARS-CoV-2 target nucleic acids are DETECTED. SARS-CoV-2 RNA is generally detectable in upper respiratory specimens  during the acute phase of infection. Positive results are indicative of the presence of the identified virus, but do not rule out bacterial infection or co-infection with other pathogens not detected by the test. Clinical correlation with patient history and other diagnostic information is necessary to determine patient infection status. The expected result is Negative. Fact Sheet for Patients:  PinkCheek.be Fact Sheet for Healthcare Providers: GravelBags.it This test is not yet approved or cleared by the [place] FDA and  has been authorized for detection and/or diagnosis of SARS-CoV-2 by FDA under an Emergency Use Authorization (EUA).  This EUA will remain in effect (meaning this test can be used) for th e duration of  the COVID-19 declaration under Section 564(b)(1) of the Act, 21 U.S.C. section 360bbb-3(b)(1), unless the authorization is terminated or revoked sooner.    Influenza A by PCR NEGATIVE NEGATIVE Final   Influenza B by PCR NEGATIVE NEGATIVE Final    Comment: (NOTE) The Xpert Xpress SARS-CoV-2/FLU/RSV assay is intended as an aid in  the diagnosis of influenza from Nasopharyngeal swab specimens and  should not be used as a sole basis for treatment. Nasal washings and  aspirates are unacceptable for Xpert Xpress SARS-CoV-2/FLU/RSV  testing. Fact Sheet for Patients: PinkCheek.be Fact Sheet for Healthcare Providers: GravelBags.it This test is not yet approved or cleared by the [place] FDA and  has been authorized for detection and/or diagnosis of SARS-CoV-2 by  FDA under an Emergency Use Authorization (EUA). This EUA will remain  in  effect (meaning this test can be used) for the duration of the  Covid-19 declaration under Section 564(b)(1) of the Act, 21  U.S.C. section 360bbb-3(b)(1), unless the authorization is  terminated or revoked. Performed at [facility], [place] [address], [place], [place] [address]     Coagulation Studies: No results for input(s): LABPROT, INR in the last 72 hours.  Urinalysis: No results for input(s): COLORURINE, LABSPEC, PHURINE, GLUCOSEU, HGBUR, BILIRUBINUR, KETONESUR, PROTEINUR, UROBILINOGEN, NITRITE, LEUKOCYTESUR in the last 72 hours.  Invalid input(s): APPERANCEUR    Imaging: US RENAL  Result Date: 11/25/2019 CLINICAL DATA:  Acute kidney injury, history breast cancer, CHF, diabetes mellitus,  hypertension EXAM: RENAL / URINARY TRACT ULTRASOUND COMPLETE COMPARISON:  CT abdomen and pelvis 11/20/2017 FINDINGS: Right Kidney: Renal measurements: 11.9 x 4.8 x 6.2 cm = volume: 185 mL. Normal cortical thickness. Minimally increased cortical echogenicity. No mass, hydronephrosis or shadowing calcification. Left Kidney: Renal measurements: 11.4 x 5.2 x 5.3 cm = volume: 166 mL. Normal cortical thickness. Minimally increased cortical echogenicity. No mass, hydronephrosis, or shadowing calcification. Bladder: Appears normal for degree of bladder distention. Other: N/A IMPRESSION: Suspected medical renal disease changes of both kidneys. No evidence of renal mass or hydronephrosis. Electronically Signed   By: Lavonia Dana M.D.   On: 11/25/2019 09:57     Medications:    . amLODipine  5 mg Oral Daily  . atorvastatin  10 mg Oral Daily  . bacitracin   Topical BID  . carvedilol  6.25 mg Oral BID  . heparin injection (subcutaneous)  5,000 Units Subcutaneous Q8H  . insulin aspart  0-15 Units Subcutaneous TID WC  . insulin aspart  0-5 Units Subcutaneous QHS  . insulin glargine  6 Units Subcutaneous Daily  . iron polysaccharides  150 mg Oral BID  . levothyroxine  112 mcg Oral Once per day on Mon  Tue Wed Thu Fri Sat  . pantoprazole  40 mg Oral BID  . Vitamin D (Ergocalciferol)  50,000 Units Oral Once per day on Sun Wed  . zinc sulfate  220 mg Oral Daily   diphenhydrAMINE, hydrALAZINE, loratadine, menthol-cetylpyridinium, phenol  Assessment/ Plan:   Acute kidney injury without hydronephrosis plan urine sediment nondistressed AIN or ATN patient was on ARB prior to admission this was stopped.  There may be some ischemic ATN that was present on admission with undocumented low blood pressures.  I would continue to avoid nephrotoxins.  I do not see a reason to discontinue Protonix at this time.  Creatinine appears to be improving as decreased to 3.18.  We will follow 1 more day to evaluate trend.  If continues to fall back to baseline we will sign off 11/27/2019.  Creatinine is pending this morning.  Hypertension/volume amlodipine will discontinue amlodipine and will restart home dose of Lasix at 40 mg daily  Covid pneumonia appears to be stable on dexamethasone appears to be completed 11/25/2019 and completed course of remdesivir  History of GI blood loss with drop in hemoglobin we will check iron studies  Diabetes mellitus as per primary  Hypothyroidism as per primary  We shall sign off patient 11/27/2019.  If creatinine is not decreasing we will be happy to reconsult.   LOS: [place] @TODAY @9 :21 AM

## 2019-11-27 NOTE — Care Management Important Message (Signed)
Important Message  Patient Details  Name: Elizabeth Olsen MRN: [id number] Date of Birth: [birth year]   Medicare Important Message Given:  Yes - Important Message mailed due to current National Emergency  Verbal consent obtained due to current National Emergency  Relationship to patient: Self Contact Name: Amelie Caracci Call Date: 11/27/19  Time: 1418 Phone: [phone] Outcome: Spoke with contact Important Message mailed to: Patient address on file    Delorse Lek 11/27/2019, 2:19 PM

## 2019-11-27 NOTE — Evaluation (Addendum)
Physical Therapy Evaluation and Discharge Patient Details Name: Elizabeth Olsen MRN: [id number] DOB: Mar 29, 1941 Today's Date: 11/27/2019   History of Present Illness  79 y.o. female,  w Jerrye Bushy, vitamin D deficiency, h/o remote breast cancer,  hypothyroidism, hypertension, hyperlipidemia, Dm2, CHF (diastolic), presents with dyspnea x 4 days. Slight cough, chronic, (pt attributes to her bp medication), slight weigh gain 10 lbs over the past few months.  Slight leg swelling right > left. Admitted 11/20/19 for dyspnea secondary to covid -19 and CHF  Clinical Impression  PTA pt living and caring for husband with Stage IV lung cancer in 11 floor apartment with elevator. Pt reports independence in mobility, ADLs and iADLs. Pt is limited in safe mobility by generalized weakness, and is currently mod I for bed mobility and transfers and supervision for ambulation of 225 feet without AD. Pt reports her daughter has come to stay with her and her husband to assist. PT is not recommending any further PT services at this time. Pt is hopeful for d/c home this afternoon.     Follow Up Recommendations No PT follow up;Supervision - Intermittent    Equipment Recommendations  None recommended by PT       Precautions / Restrictions Precautions Precautions: None Restrictions Weight Bearing Restrictions: No      Mobility  Bed Mobility Overal bed mobility: Modified Independent             General bed mobility comments: HoB elevated, increased effort  Transfers Overall transfer level: Modified independent Equipment used: None             General transfer comment: good power up and steadying in standing, pt reports she always stops and minute in standing before starting to walk   Ambulation/Gait Ambulation/Gait assistance: Supervision Gait Distance (Feet): 225 Feet Assistive device: None Gait Pattern/deviations: WFL(Within Functional Limits) Gait velocity: slowed Gait velocity interpretation:  1.31 - 2.62 ft/sec, indicative of limited community ambulator General Gait Details: supervision for safety with slow, steady gait        Balance Overall balance assessment: Mild deficits observed, not formally tested                                           Pertinent Vitals/Pain Pain Assessment: Faces Faces Pain Scale: Hurts a little bit Pain Location: L ear and throat Pain Descriptors / Indicators: Aching;Sore Pain Intervention(s): Limited activity within patient's tolerance;Monitored during session;Repositioned    Home Living Family/patient expects to be discharged to:: Private residence Living Arrangements: Spouse/significant other Available Help at Discharge: Available 24 hours/day;Family Type of Home: Apartment Home Access: Elevator     Home Layout: One level Home Equipment: None      Prior Function Level of Independence: Independent         Comments: takes care of husband with stage IV lung cancer        Extremity/Trunk Assessment   Upper Extremity Assessment Upper Extremity Assessment: Generalized weakness    Lower Extremity Assessment Lower Extremity Assessment: Generalized weakness       Communication   Communication: No difficulties  Cognition Arousal/Alertness: Awake/alert Behavior During Therapy: WFL for tasks assessed/performed Overall Cognitive Status: Within Functional Limits for tasks assessed  General Comments General comments (skin integrity, edema, etc.): VSS, ambulated on RA with SaO2 94-99%O2        Assessment/Plan    PT Assessment Patent does not need any further PT services         PT Goals (Current goals can be found in the Care Plan section)  Acute Rehab PT Goals Patient Stated Goal: go home to husband of 59 years PT Goal Formulation: With patient Time For Goal Achievement: 12/11/19 Potential to Achieve Goals: Good     AM-PAC PT "6 Clicks"  Mobility  Outcome Measure Help needed turning from your back to your side while in a flat bed without using bedrails?: None Help needed moving from lying on your back to sitting on the side of a flat bed without using bedrails?: None Help needed moving to and from a bed to a chair (including a wheelchair)?: None Help needed standing up from a chair using your arms (e.g., wheelchair or bedside chair)?: None Help needed to walk in hospital room?: None Help needed climbing 3-5 steps with a railing? : A Little 6 Click Score: 23    End of Session Equipment Utilized During Treatment: Gait belt Activity Tolerance: Patient tolerated treatment well Patient left: in bed;with call bell/phone within reach Nurse Communication: Mobility status PT Visit Diagnosis: Muscle weakness (generalized) (M62.81)    Time: 6578-4696 PT Time Calculation (min) (ACUTE ONLY): 23 min   Charges:   PT Evaluation $PT Eval Low Complexity: 1 Low PT Treatments $Gait Training: 8-22 mins        Lavante Toso B. Migdalia Dk PT, DPT Acute Rehabilitation Services Pager [phone] Office [phone]   [place] 11/27/2019, 1:22 PM

## 2019-11-27 NOTE — Progress Notes (Signed)
Nsg Discharge Note  Admit Date:  11/20/2019 Discharge date: 11/27/2019   Elizabeth Olsen to be D/C'd Home per MD order.  AVS completed.  Copy for chart, and copy for patient signed, and dated. Patient/caregiver able to verbalize understanding.  Discharge Medication: Allergies as of 11/27/2019      Reactions   Erythromycin Shortness Of Breath, Swelling   "throat swelling", sob, thrashing ,    Penicillins Rash   Has patient had a PCN reaction causing immediate rash, facial/tongue/throat swelling, SOB or lightheadedness with hypotension: Unknown Has patient had a PCN reaction causing severe rash involving mucus membranes or skin necrosis: No Has patient had a PCN reaction that required hospitalization: No Has patient had a PCN reaction occurring within the last 10 years: No If all of the above answers are "NO", then may proceed with Cephalosporin use.   Nyquil [pseudoeph-doxylamine-dm-apap] Rash   Sulfa Antibiotics Rash      Medication List    STOP taking these medications   amLODipine 5 MG tablet Commonly known as: NORVASC   losartan 25 MG tablet Commonly known as: COZAAR     TAKE these medications   atorvastatin 10 MG tablet Commonly known as: LIPITOR TAKE 1 TABLET BY MOUTH EVERY DAY ONE TIME ONLY What changed: See the new instructions.   calcium carbonate 1500 (600 Ca) MG Tabs tablet Commonly known as: OSCAL Take 600 mg by mouth daily with breakfast.   carvedilol 6.25 MG tablet Commonly known as: COREG Take 1 tablet (6.25 mg total) by mouth 2 (two) times daily.   fenofibrate 160 MG tablet Take 160 mg by mouth daily.   fish oil-omega-3 fatty acids 1000 MG capsule Take 1 g by mouth daily.   FreeStyle [employer] 14 Day Sensor Misc See admin instructions.   furosemide 20 MG tablet Commonly known as: LASIX TAKE 2 TABLETS BY MOUTH EVERY DAY   HumaLOG KwikPen 100 UNIT/ML KiwkPen Generic drug: insulin lispro Inject 5-20 Units into the skin 3 (three) times daily. Sliding  Scale   iron polysaccharides 150 MG capsule Commonly known as: NIFEREX Take 150 mg by mouth 2 (two) times daily.   Lantus SoloStar 100 UNIT/ML Solostar Pen Generic drug: Insulin Glargine Inject 4-6 Units into the skin See admin instructions. Use 6 units every morning and use 4 units every evening   levothyroxine 112 MCG tablet Commonly known as: SYNTHROID Take 112 mcg by mouth See admin instructions. Take 1 tablet (112 mcg) by mouth on 6 days of the week; hold on Sundays.   OneTouch Verio test strip Generic drug: glucose blood USE TO TEST UP TO 10 TIMES D   pantoprazole 40 MG tablet Commonly known as: PROTONIX Take 1 tablet (40 mg total) by mouth 2 (two) times daily.   vitamin C 500 MG tablet Commonly known as: ASCORBIC ACID Take 500 mg by mouth daily.   Vitamin D (Ergocalciferol) 1.25 MG (50000 UNIT) Caps capsule Commonly known as: DRISDOL Take 50,000 Units by mouth 2 (two) times a week. Sundays & Wednesdays.   zinc sulfate 220 (50 Zn) MG capsule Take 1 capsule (220 mg total) by mouth daily. Start taking on: November 28, 2019       Discharge Assessment: Vitals:   11/26/19 2150 11/27/19 0500  BP: (!) 148/59   Pulse: 81   Resp: 14   Temp:  98.5 F (36.9 C)  SpO2: 96%    Skin clean, dry and intact without evidence of skin break down, no evidence of skin tears noted.  IV catheter discontinued intact. Site without signs and symptoms of complications - no redness or edema noted at insertion site, patient denies c/o pain - only slight tenderness at site.  Dressing with slight pressure applied.  D/c Instructions-Education: Discharge instructions given to patient/family with verbalized understanding. D/c education completed with patient/family including follow up instructions, medication list, d/c activities limitations if indicated, with other d/c instructions as indicated by MD - patient able to verbalize understanding, all questions fully answered. Patient instructed to  return to ED, call 911, or call MD for any changes in condition.  Patient escorted via [place], and D/C home via private auto.  Merlyn Lot, RN 11/27/2019 2:59 PM

## 2019-11-27 NOTE — Discharge Summary (Signed)
Physician Discharge Summary  Elizabeth Olsen [facility]:[id number] DOB: 08-16-1941 DOA: 11/20/2019  PCP: [name], MD  Admit date: 11/20/2019 Discharge date: 11/27/2019  Admitted From: Home. Disposition:  Home   Recommendations for Outpatient Follow-up:  1. Follow up with PCP in 1-2 weeks 2. Please obtain BMP/CBC in one week Please follow up with nephrology in 2 weeks.  Please follow up with gastroenterology in 1 to 2 weeks.     Discharge Condition:stable.  CODE STATUS:FULL CODE.  Diet recommendation: Heart Healthy  Brief/Interim Summary: Old lady prior history of GERD, history of remote breast cancer, hypothyroidism, hypertension, hyperlipidemia, type 2 diabetes mellitus, diastolic heart failure presents with shortness of breath for 4 days and cough.  On arrival to ED she was tested positive for Covid. Her hospital course was complicated by a drop in hemoglobin of 1.2 g and her stool was found to be positive for blood.  GI consulted recommended no EGD or colonoscopy unless patient becomes symptomatic with hematemesis or melena.  Recommend outpatient follow-up.  Further complication with AKI at which time nephrology was consulted recommended watchful observation for now and avoid nephrotoxins and hypotension.  Patient also underwent VQ scan which was negative for PE and venous duplex was negative for DVT.   Discharge Diagnoses:  Active Problems:   Diabetes mellitus ([place])   Hyperlipidemia   Anemia   Essential hypertension   Dyspnea   Acute diastolic CHF (congestive heart failure) ([place])   COVID-19 virus infection   COVID-19 viral illness Patient completed remdesivir and steroids. Recommend to continue with incentive spirometer and flutter valve and ambulate as tolerated. VQ scan did not show any PE Venous duplex of the lower extremities is negative for DVT.    Acute on chronic diastolic heart failure Recent echo shows grade 2 diastolic dysfunction. She was initially started  on Lasix but then it was held for worsening renal parameters Patient is currently euvolemic.    Acute kidney injury probably secondary to dehydration, undocumented low blood pressures on admission.  Baseline creatinine is around 1.7.  Creatinine peaked at 3.5. Currently creatinine is improving to 3.1. Renal ultrasound does not show any hydronephrosis but has some chronic kidney disease.    Diabetes mellitus Brittle diabetes mellitus. Hemoglobin A1c is 9. Patient was initially on 10 units during the day and 4 units at night and because of hypoglycemic episode earlier this morning night dose was discontinued and her a.m. dose of Lantus was decreased to 6 units daily.    Essential hypertension Blood pressure parameters much controlled.   Heme positive stools GI consulted recommended no EGD or colonoscopy unless patient becomes symptomatic with hematemesis or melena Recommended to continue with Protonix twice daily and follow-up as outpatient.     Discharge Instructions  Discharge Instructions    Diet - low sodium heart healthy   Complete by: As directed    Discharge instructions   Complete by: As directed    Please follow up with PCP in one week.  Please follow up with gastroenterology in 1 to 2 weeks.   MyChart COVID-19 home monitoring program   Complete by: Nov 27, 2019    Is the patient willing to use the [facility] for home monitoring?: Yes   Temperature monitoring   Complete by: Nov 27, 2019    After how many days would you like to receive a notification of this patient's flowsheet entries?: 1     Allergies as of 11/27/2019      Reactions  Erythromycin Shortness Of Breath, Swelling   "throat swelling", sob, thrashing ,    Penicillins Rash   Has patient had a PCN reaction causing immediate rash, facial/tongue/throat swelling, SOB or lightheadedness with hypotension: Unknown Has patient had a PCN reaction causing severe rash involving mucus  membranes or skin necrosis: No Has patient had a PCN reaction that required hospitalization: No Has patient had a PCN reaction occurring within the last 10 years: No If all of the above answers are "NO", then may proceed with Cephalosporin use.   Nyquil [pseudoeph-doxylamine-dm-apap] Rash   Sulfa Antibiotics Rash      Medication List    STOP taking these medications   amLODipine 5 MG tablet Commonly known as: NORVASC   losartan 25 MG tablet Commonly known as: COZAAR     TAKE these medications   atorvastatin 10 MG tablet Commonly known as: LIPITOR TAKE 1 TABLET BY MOUTH EVERY DAY ONE TIME ONLY What changed: See the new instructions.   calcium carbonate 1500 (600 Ca) MG Tabs tablet Commonly known as: OSCAL Take 600 mg by mouth daily with breakfast.   carvedilol 6.25 MG tablet Commonly known as: COREG Take 1 tablet (6.25 mg total) by mouth 2 (two) times daily.   fenofibrate 160 MG tablet Take 160 mg by mouth daily.   fish oil-omega-3 fatty acids 1000 MG capsule Take 1 g by mouth daily.   FreeStyle [employer] 14 Day Sensor Misc See admin instructions.   furosemide 20 MG tablet Commonly known as: LASIX TAKE 2 TABLETS BY MOUTH EVERY DAY   HumaLOG KwikPen 100 UNIT/ML KiwkPen Generic drug: insulin lispro Inject 5-20 Units into the skin 3 (three) times daily. Sliding Scale   iron polysaccharides 150 MG capsule Commonly known as: NIFEREX Take 150 mg by mouth 2 (two) times daily.   Lantus SoloStar 100 UNIT/ML Solostar Pen Generic drug: Insulin Glargine Inject 4-6 Units into the skin See admin instructions. Use 6 units every morning and use 4 units every evening   levothyroxine 112 MCG tablet Commonly known as: SYNTHROID Take 112 mcg by mouth See admin instructions. Take 1 tablet (112 mcg) by mouth on 6 days of the week; hold on Sundays.   OneTouch Verio test strip Generic drug: glucose blood USE TO TEST UP TO 10 TIMES D   pantoprazole 40 MG tablet Commonly known as:  PROTONIX Take 1 tablet (40 mg total) by mouth 2 (two) times daily.   vitamin C 500 MG tablet Commonly known as: ASCORBIC ACID Take 500 mg by mouth daily.   Vitamin D (Ergocalciferol) 1.25 MG (50000 UNIT) Caps capsule Commonly known as: DRISDOL Take 50,000 Units by mouth 2 (two) times a week. Sundays & Wednesdays.   zinc sulfate 220 (50 Zn) MG capsule Take 1 capsule (220 mg total) by mouth daily. Start taking on: November 28, 2019      Follow-up Information    [name], MD. Schedule an appointment as soon as possible for a visit in 1 week(s).   Specialty: Family Medicine Contact information: [place] [address] [phone]        Park Liter, MD .   Specialty: Cardiology Contact information: [place] [address] [phone]        Juanita Craver, MD. Schedule an appointment as soon as possible for a visit in 2 week(s).   Specialty: Gastroenterology Contact information: [address] [place] [address] [phone]  Allergies  Allergen Reactions  . Erythromycin Shortness Of Breath and Swelling    "throat swelling", sob, thrashing ,   . Penicillins Rash    Has patient had a PCN reaction causing immediate rash, facial/tongue/throat swelling, SOB or lightheadedness with hypotension: Unknown Has patient had a PCN reaction causing severe rash involving mucus membranes or skin necrosis: No Has patient had a PCN reaction that required hospitalization: No Has patient had a PCN reaction occurring within the last 10 years: No If all of the above answers are "NO", then may proceed with Cephalosporin use.   Marland Kitchen Nyquil [Pseudoeph-Doxylamine-Dm-Apap] Rash  . Sulfa Antibiotics Rash    Consultations:  nephrology   Procedures/Studies: NM Pulmonary Perfusion  Result Date: 11/21/2019 CLINICAL DATA:  COVID positive, shortness of breath EXAM: NUCLEAR MEDICINE PERFUSION LUNG SCAN  TECHNIQUE: Perfusion images were obtained in multiple projections after intravenous injection of radiopharmaceutical. Ventilation scans intentionally deferred if perfusion scan and chest x-ray adequate for interpretation during COVID 19 epidemic. RADIOPHARMACEUTICALS:  1.5 mCi Tc-74m MAA IV COMPARISON:  None. FINDINGS: No significant perfusion defects of bilateral lungs. IMPRESSION: No scintigraphic evidence of pulmonary embolus. Electronically Signed   By: Kathreen Devoid   On: 11/21/2019 13:04   US RENAL  Result Date: 11/25/2019 CLINICAL DATA:  Acute kidney injury, history breast cancer, CHF, diabetes mellitus, hypertension EXAM: RENAL / URINARY TRACT ULTRASOUND COMPLETE COMPARISON:  CT abdomen and pelvis 11/20/2017 FINDINGS: Right Kidney: Renal measurements: 11.9 x 4.8 x 6.2 cm = volume: 185 mL. Normal cortical thickness. Minimally increased cortical echogenicity. No mass, hydronephrosis or shadowing calcification. Left Kidney: Renal measurements: 11.4 x 5.2 x 5.3 cm = volume: 166 mL. Normal cortical thickness. Minimally increased cortical echogenicity. No mass, hydronephrosis, or shadowing calcification. Bladder: Appears normal for degree of bladder distention. Other: N/A IMPRESSION: Suspected medical renal disease changes of both kidneys. No evidence of renal mass or hydronephrosis. Electronically Signed   By: Lavonia Dana M.D.   On: 11/25/2019 09:57   DG Chest Port 1 View  Result Date: 11/21/2019 CLINICAL DATA:  Preprocedure examination. Dyspnea. Lower extremity edema. EXAM: PORTABLE CHEST 1 VIEW COMPARISON:  Chest x-rays dated 11/20/2019 and 09/04/2018 FINDINGS: There is cardiomegaly and slight prominence of the main pulmonary arteries even considering the AP portable technique. There are no infiltrates or effusions. No acute bone abnormality. The small nodular density seen over the right upper lobe on the prior study are not apparent on this exam. The aeration has improved both lung bases. IMPRESSION: No  acute abnormality. Improved aeration at the lung bases. Persistent cardiomegaly and prominence of the main pulmonary arteries. Aortic Atherosclerosis (ICD10-I70.0). Electronically Signed   By: Lorriane Shire M.D.   On: 11/21/2019 10:59   DG Chest Portable 1 View  Result Date: 11/20/2019 CLINICAL DATA:  Dyspnea EXAM: PORTABLE CHEST 1 VIEW COMPARISON:  September 04, 2018 FINDINGS: The heart size is enlarged. There are small bilateral pleural effusions. There are prominent interstitial lung markings, new from prior study. There is no pneumothorax. Atelectasis is suspected at the lung bases. There are a few nodular opacities in the right upper lobe that appear to be new since prior study. IMPRESSION: 1. Cardiomegaly with findings suggestive of congestive heart failure. An atypical infectious process can have a similar appearance. 2. Small nodular densities in the right upper lobe. Attention on follow-up examinations is recommended. Electronically Signed   By: Constance Holster M.D.   On: 11/20/2019 01:22   ECHOCARDIOGRAM COMPLETE  Result Date: 11/20/2019   ECHOCARDIOGRAM REPORT  Patient Name:   RAYAN DYAL [facility] Date of Exam: 11/20/2019 Medical Rec #:  [id number]      Height:       63.0 in Accession #:    [id number]     Weight:       155.0 lb Date of Birth:  Dec 18, 1940       BSA:          1.74 m Patient Age:    13 years       BP:           160/65 mmHg Patient Gender: F              HR:           84 bpm. Exam Location:  Inpatient Procedure: 2D Echo Indications:    Elevated Troponin  History:        Patient has prior history of Echocardiogram examinations, most                 recent 10/08/2019. CHF, Signs/Symptoms:Dyspnea; Risk                 Factors:Dyslipidemia, Diabetes and Hypertension. COVID-19 virus                 infection.  Sonographer:    Vikki Ports Turrentine Referring Phys: [place]  1. Left ventricular ejection fraction, by visual estimation, is 55 to 60%. The left ventricle has normal  function. There is mildly increased left ventricular hypertrophy.  2. Left ventricular diastolic parameters are consistent with Grade I diastolic dysfunction (impaired relaxation).  3. The left ventricle has no regional wall motion abnormalities.  4. Global right ventricle has normal systolic function.The right ventricular size is normal. No increase in right ventricular wall thickness.  5. Left atrial size was moderately dilated.  6. Right atrial size was normal.  7. Moderate mitral annular calcification.  8. The mitral valve is normal in structure. Mild mitral valve regurgitation. No evidence of mitral stenosis.  9. The tricuspid valve is normal in structure. Tricuspid valve regurgitation is trivial. 10. The aortic valve is tricuspid. Aortic valve regurgitation is mild. Mild to moderate aortic valve sclerosis/calcification without any evidence of aortic stenosis. 11. The tricuspid regurgitant velocity is 2.71 m/s, and with an assumed right atrial pressure of 3 mmHg, the estimated right ventricular systolic pressure is mildly elevated at 32.4 mmHg. 12. The inferior vena cava is normal in size with greater than 50% respiratory variability, suggesting right atrial pressure of 3 mmHg. FINDINGS  Left Ventricle: Left ventricular ejection fraction, by visual estimation, is 55 to 60%. The left ventricle has normal function. The left ventricle has no regional wall motion abnormalities. The left ventricular internal cavity size was the left ventricle is normal in size. There is mildly increased left ventricular hypertrophy. Left ventricular diastolic parameters are consistent with Grade I diastolic dysfunction (impaired relaxation). Right Ventricle: The right ventricular size is normal. No increase in right ventricular wall thickness. Global RV systolic function is has normal systolic function. The tricuspid regurgitant velocity is 2.71 m/s, and with an assumed right atrial pressure  of 3 mmHg, the estimated right  ventricular systolic pressure is mildly elevated at 32.4 mmHg. Left Atrium: Left atrial size was moderately dilated. Right Atrium: Right atrial size was normal in size Pericardium: There is no evidence of pericardial effusion. Mitral Valve: The mitral valve is normal in structure. Moderate mitral annular calcification. Mild mitral valve regurgitation. No evidence of mitral valve stenosis by  observation. Tricuspid Valve: The tricuspid valve is normal in structure. Tricuspid valve regurgitation is trivial. Aortic Valve: The aortic valve is tricuspid. Aortic valve regurgitation is mild. Mild to moderate aortic valve sclerosis/calcification is present, without any evidence of aortic stenosis. Aortic valve mean gradient measures 9.0 mmHg. Aortic valve peak gradient measures 15.6 mmHg. Aortic valve area, by VTI measures 1.85 cm. Pulmonic Valve: The pulmonic valve was normal in structure. Pulmonic valve regurgitation is not visualized. Pulmonic regurgitation is not visualized. Aorta: The aortic root is normal in size and structure. Venous: The inferior vena cava is normal in size with greater than 50% respiratory variability, suggesting right atrial pressure of 3 mmHg. IAS/Shunts: No atrial level shunt detected by color flow Doppler.  LEFT VENTRICLE PLAX 2D LVIDd:         4.80 cm  Diastology LVIDs:         3.60 cm  LV e' lateral:   5.98 cm/s LV PW:         0.90 cm  LV E/e' lateral: 17.4 LV IVS:        0.90 cm  LV e' medial:    4.35 cm/s LVOT diam:     2.00 cm  LV E/e' medial:  23.9 LV SV:         53 ml LV SV Index:   29.72 LVOT Area:     3.14 cm  RIGHT VENTRICLE RV S prime:     13.70 cm/s TAPSE (M-mode): 2.8 cm LEFT ATRIUM             Index       RIGHT ATRIUM           Index LA diam:        3.60 cm 2.07 cm/m  RA Area:     17.90 cm LA Vol (A2C):   76.7 ml 44.20 ml/m RA Volume:   49.70 ml  28.64 ml/m LA Vol (A4C):   62.4 ml 35.96 ml/m LA Biplane Vol: 70.2 ml 40.46 ml/m  AORTIC VALVE AV Area (Vmax):    1.62 cm AV  Area (Vmean):   1.67 cm AV Area (VTI):     1.85 cm AV Vmax:           197.50 cm/s AV Vmean:          141.000 cm/s AV VTI:            0.418 m AV Peak Grad:      15.6 mmHg AV Mean Grad:      9.0 mmHg LVOT Vmax:         102.00 cm/s LVOT Vmean:        75.000 cm/s LVOT VTI:          0.246 m LVOT/AV VTI ratio: 0.59  AORTA Ao Root diam: 2.80 cm MITRAL VALVE                         TRICUSPID VALVE MV Area (PHT): 5.02 cm              TR Peak grad:   29.4 mmHg MV PHT:        43.79 msec            TR Vmax:        271.00 cm/s MV Decel Time: 151 msec MV E velocity: 104.00 cm/s 103 cm/s  SHUNTS MV A velocity: 114.00 cm/s 70.3 cm/s Systemic VTI:  0.25 m MV E/A ratio:  0.91  1.5       Systemic Diam: 2.00 cm  Loralie Champagne MD Electronically signed by Loralie Champagne MD Signature Date/Time: 11/20/2019/2:34:55 PM    Final    VAS [place] LOWER EXTREMITY VENOUS (DVT)  Result Date: 11/20/2019  Lower Venous Study Indications: Pain, and Swelling. Other Indications: DM, CHF. Risk Factors: Cancer breast. Limitations: Bandages. Comparison Study: No prior study. Performing Technologist: Baldwin Crown ARDMS, RVT  Examination Guidelines: A complete evaluation includes B-mode imaging, spectral Doppler, color Doppler, and power Doppler as needed of all accessible portions of each vessel. Bilateral testing is considered an integral part of a complete examination. Limited examinations for reoccurring indications may be performed as noted.  +---------+---------------+---------+-----------+----------+------------------+ RIGHT    CompressibilityPhasicitySpontaneityPropertiesThrombus Aging     +---------+---------------+---------+-----------+----------+------------------+ CFV      Full           Yes      Yes                                     +---------+---------------+---------+-----------+----------+------------------+ SFJ      Full                                                             +---------+---------------+---------+-----------+----------+------------------+ FV Prox  Full                                                            +---------+---------------+---------+-----------+----------+------------------+ FV Mid   Full                                                            +---------+---------------+---------+-----------+----------+------------------+ FV DistalFull                                                            +---------+---------------+---------+-----------+----------+------------------+ PFV      Full                                                            +---------+---------------+---------+-----------+----------+------------------+ POP      Full           Yes      Yes                                     +---------+---------------+---------+-----------+----------+------------------+ PTV      Full  Seen with color                                                          flow               +---------+---------------+---------+-----------+----------+------------------+ PERO     Full                                         Seen with color                                                          flow               +---------+---------------+---------+-----------+----------+------------------+ Poorly visualized calf veins due to patient in pain and swelling.  +---------+---------------+---------+-----------+----------+------------------+ LEFT     CompressibilityPhasicitySpontaneityPropertiesThrombus Aging     +---------+---------------+---------+-----------+----------+------------------+ CFV      Full           Yes      Yes                                     +---------+---------------+---------+-----------+----------+------------------+ SFJ      Full                                                             +---------+---------------+---------+-----------+----------+------------------+ FV Prox  Full                                                            +---------+---------------+---------+-----------+----------+------------------+ FV Mid   Full                                                            +---------+---------------+---------+-----------+----------+------------------+ FV DistalFull                                                            +---------+---------------+---------+-----------+----------+------------------+ PFV      Full                                                            +---------+---------------+---------+-----------+----------+------------------+  POP      Full           Yes      Yes                                     +---------+---------------+---------+-----------+----------+------------------+ PTV      Full                                         Seen with color                                                          flow               +---------+---------------+---------+-----------+----------+------------------+ PERO     Full                                         Seen with color                                                          flow               +---------+---------------+---------+-----------+----------+------------------+ Unable to visualize prox and mid calf veins due to bandages.   Summary: Right: There is no evidence of deep vein thrombosis in the lower extremity. No cystic structure found in the popliteal fossa. Left: There is no evidence of deep vein thrombosis in the lower extremity. No cystic structure found in the popliteal fossa. Unable to visualize prox and mid calf veins due to bandages.  *See table(s) above for measurements and observations. Electronically signed by Monica Martinez MD on 11/20/2019 at 5:00:37 PM.    Final    (Echo, Carotid, EGD, Colonoscopy, ERCP)     Subjective:   Discharge Exam: Vitals:   11/26/19 2150 11/27/19 0500  BP: (!) 148/59   Pulse: 81   Resp: 14   Temp:  98.5 F (36.9 C)  SpO2: 96%    Vitals:   11/26/19 2004 11/26/19 2005 11/26/19 2150 11/27/19 0500  BP: (!) 140/58 (!) 140/58 (!) 148/59   Pulse:  80 81   Resp:  14 14   Temp: 98 F (36.7 C) 98.1 F (36.7 C)  98.5 F (36.9 C)  TempSrc:      SpO2:  93% 96%   Weight:      Height:        General: Pt is alert, awake, not in acute distress Cardiovascular: RRR, S1/S2 +, no rubs, no gallops Respiratory: CTA bilaterally, no wheezing, no rhonchi Abdominal: Soft, NT, ND, bowel sounds + Extremities: no edema, no cyanosis    The results of significant diagnostics from this hospitalization (including imaging, microbiology, ancillary and laboratory) are listed below for reference.     Microbiology: Recent Results (from the past 240 hour(s))  Respiratory Panel by RT PCR (Flu A&B, Covid) -  Nasopharyngeal Swab     Status: Abnormal   Collection Time: 11/20/19  2:43 AM   Specimen: Nasopharyngeal Swab  Result Value Ref Range Status   SARS Coronavirus 2 by RT PCR POSITIVE (A) NEGATIVE Final    Comment: RESULT CALLED TO, READ BACK BY AND VERIFIED WITH: L VENEGAS RN 11/20/19 0401  JDW (NOTE) SARS-CoV-2 target nucleic acids are DETECTED. SARS-CoV-2 RNA is generally detectable in upper respiratory specimens  during the acute phase of infection. Positive results are indicative of the presence of the identified virus, but do not rule out bacterial infection or co-infection with other pathogens not detected by the test. Clinical correlation with patient history and other diagnostic information is necessary to determine patient infection status. The expected result is Negative. Fact Sheet for Patients:  PinkCheek.be Fact Sheet for Healthcare Providers: GravelBags.it This test is not yet approved or cleared by  the [place] FDA and  has been authorized for detection and/or diagnosis of SARS-CoV-2 by FDA under an Emergency Use Authorization (EUA).  This EUA will remain in effect (meaning this test can be used) for th e duration of  the COVID-19 declaration under Section 564(b)(1) of the Act, 21 U.S.C. section 360bbb-3(b)(1), unless the authorization is terminated or revoked sooner.    Influenza A by PCR NEGATIVE NEGATIVE Final   Influenza B by PCR NEGATIVE NEGATIVE Final    Comment: (NOTE) The Xpert Xpress SARS-CoV-2/FLU/RSV assay is intended as an aid in  the diagnosis of influenza from Nasopharyngeal swab specimens and  should not be used as a sole basis for treatment. Nasal washings and  aspirates are unacceptable for Xpert Xpress SARS-CoV-2/FLU/RSV  testing. Fact Sheet for Patients: PinkCheek.be Fact Sheet for Healthcare Providers: GravelBags.it This test is not yet approved or cleared by the [place] FDA and  has been authorized for detection and/or diagnosis of SARS-CoV-2 by  FDA under an Emergency Use Authorization (EUA). This EUA will remain  in effect (meaning this test can be used) for the duration of the  Covid-19 declaration under Section 564(b)(1) of the Act, 21  U.S.C. section 360bbb-3(b)(1), unless the authorization is  terminated or revoked. Performed at [facility] [facility], [place] [address], [place], [place] [address]      Labs: BNP (last 3 results) Recent Labs    11/20/19 0049  BNP 811.9*   Basic Metabolic Panel: Recent Labs  Lab 11/22/19 0253 11/22/19 0253 11/23/19 0229 11/24/19 0500 11/25/19 0429 11/26/19 0224 11/27/19 0722  NA 137   < > 135 133* 134* 137 131*  K 4.7   < > 4.5 4.8 4.3 4.2 4.9  CL 101   < > 98 96* 99 103 103  CO2 26   < > 25 24 23 24 23   GLUCOSE 266*   < > 235* 336* 292* 64* 366*  BUN 47*   < > 61* 71* 83* 81* 66*  CREATININE 2.47*   < > 2.89* 3.36* 3.51* 3.18* 3.18*   CALCIUM 8.5*   < > 8.3* 8.3* 8.4* 8.0* 7.7*  MG 2.0  --  2.0  --   --   --   --   PHOS 4.4  --  4.5  --   --   --   --    < > = values in this interval not displayed.   Liver Function Tests: Recent Labs  Lab 11/21/19 0226 11/22/19 0253 11/23/19 0229 11/24/19 0500 11/25/19 0429  AST 30 38 34 36 32  ALT 19  20 20 21 22   ALKPHOS 53 56 51 54 58  BILITOT 0.4 0.4 0.4 0.5 0.9  PROT 5.2* 5.4* 5.3* 5.4* 5.6*  ALBUMIN 2.5* 2.6* 2.4* 2.5* 2.6*   No results for input(s): LIPASE, AMYLASE in the last 168 hours. No results for input(s): AMMONIA in the last 168 hours. CBC: Recent Labs  Lab 11/22/19 0253 11/22/19 0253 11/23/19 0229 11/23/19 1546 11/24/19 0500 11/25/19 0429 11/26/19 0224  WBC 10.0  --  9.3  --  10.5 13.6* 12.0*  HGB 8.8*   < > 8.5* 9.4* 9.5* 10.0* 9.4*  HCT 27.8*   < > 27.0* 29.2* 30.2* 31.7* 29.3*  MCV 89.7  --  89.7  --  90.4 88.5 89.9  PLT 397  --  420*  --  480* 567* 474*   < > = values in this interval not displayed.   Cardiac Enzymes: No results for input(s): CKTOTAL, CKMB, CKMBINDEX, TROPONINI in the last 168 hours. BNP: Invalid input(s): POCBNP CBG: Recent Labs  Lab 11/26/19 1558 11/26/19 1711 11/26/19 2010 11/27/19 0751 11/27/19 1226  GLUCAP 94 75 158* 370* 276*   D-Dimer No results for input(s): DDIMER in the last 72 hours. Hgb A1c No results for input(s): HGBA1C in the last 72 hours. Lipid Profile No results for input(s): CHOL, HDL, LDLCALC, TRIG, CHOLHDL, LDLDIRECT in the last 72 hours. Thyroid function studies No results for input(s): TSH, T4TOTAL, T3FREE, THYROIDAB in the last 72 hours.  Invalid input(s): FREET3 Anemia work up Recent Labs    11/25/19 0934  TIBC 244*  IRON 35   Urinalysis    Component Value Date/Time   COLORURINE STRAW (A) 11/20/2019 0520   APPEARANCEUR CLEAR 11/20/2019 0520   LABSPEC 1.012 11/20/2019 0520   PHURINE 6.0 11/20/2019 0520   GLUCOSEU >=500 (A) 11/20/2019 0520   HGBUR NEGATIVE 11/20/2019 0520    BILIRUBINUR NEGATIVE 11/20/2019 0520   KETONESUR NEGATIVE 11/20/2019 0520   PROTEINUR >=300 (A) 11/20/2019 0520   UROBILINOGEN 0.2 04/12/2013 1421   NITRITE NEGATIVE 11/20/2019 0520   LEUKOCYTESUR NEGATIVE 11/20/2019 0520   Sepsis Labs Invalid input(s): PROCALCITONIN,  WBC,  LACTICIDVEN Microbiology Recent Results (from the past 240 hour(s))  Respiratory Panel by RT PCR (Flu A&B, Covid) - Nasopharyngeal Swab     Status: Abnormal   Collection Time: 11/20/19  2:43 AM   Specimen: Nasopharyngeal Swab  Result Value Ref Range Status   SARS Coronavirus 2 by RT PCR POSITIVE (A) NEGATIVE Final    Comment: RESULT CALLED TO, READ BACK BY AND VERIFIED WITH: L VENEGAS RN 11/20/19 0401  JDW (NOTE) SARS-CoV-2 target nucleic acids are DETECTED. SARS-CoV-2 RNA is generally detectable in upper respiratory specimens  during the acute phase of infection. Positive results are indicative of the presence of the identified virus, but do not rule out bacterial infection or co-infection with other pathogens not detected by the test. Clinical correlation with patient history and other diagnostic information is necessary to determine patient infection status. The expected result is Negative. Fact Sheet for Patients:  PinkCheek.be Fact Sheet for Healthcare Providers: GravelBags.it This test is not yet approved or cleared by the [place] FDA and  has been authorized for detection and/or diagnosis of SARS-CoV-2 by FDA under an Emergency Use Authorization (EUA).  This EUA will remain in effect (meaning this test can be used) for th e duration of  the COVID-19 declaration under Section 564(b)(1) of the Act, 21 U.S.C. section 360bbb-3(b)(1), unless the authorization is terminated or revoked sooner.  Influenza A by PCR NEGATIVE NEGATIVE Final   Influenza B by PCR NEGATIVE NEGATIVE Final    Comment: (NOTE) The Xpert Xpress SARS-CoV-2/FLU/RSV assay  is intended as an aid in  the diagnosis of influenza from Nasopharyngeal swab specimens and  should not be used as a sole basis for treatment. Nasal washings and  aspirates are unacceptable for Xpert Xpress SARS-CoV-2/FLU/RSV  testing. Fact Sheet for Patients: PinkCheek.be Fact Sheet for Healthcare Providers: GravelBags.it This test is not yet approved or cleared by the [place] FDA and  has been authorized for detection and/or diagnosis of SARS-CoV-2 by  FDA under an Emergency Use Authorization (EUA). This EUA will remain  in effect (meaning this test can be used) for the duration of the  Covid-19 declaration under Section 564(b)(1) of the Act, 21  U.S.C. section 360bbb-3(b)(1), unless the authorization is  terminated or revoked. Performed at [facility], [place] [address], [place], [place] [address]      Time coordinating discharge: 36 minutes  SIGNED:   Hosie Poisson, MD  [facility] 11/27/2019, 8:05 PM

## 2019-11-27 NOTE — Progress Notes (Signed)
OT Screen Note  Patient Details Name: Elizabeth Olsen MRN: [id number] DOB: January 01, 1941   Cancelled Treatment:    Reason Eval/Treat Not Completed: OT screened, no needs identified, will sign off. Per PT, pt near baseline for functional activities and mobility. VSS throughout on RA.   [place], OTR/L Acute Rehab Pager: [phone] Office: [phone] 11/27/2019, 1:40 PM

## 2019-11-27 NOTE — Plan of Care (Signed)

## 2019-12-02 ENCOUNTER — Ambulatory Visit: Payer: Medicare Other

## 2019-12-02 ENCOUNTER — Other Ambulatory Visit: Payer: Medicare Other

## 2019-12-16 ENCOUNTER — Inpatient Hospital Stay: Payer: Medicare Other | Attending: Hematology

## 2019-12-16 ENCOUNTER — Inpatient Hospital Stay: Payer: Medicare Other

## 2019-12-16 ENCOUNTER — Other Ambulatory Visit: Payer: Self-pay

## 2019-12-16 VITALS — BP 142/72 | HR 78 | Temp 98.2°F | Resp 18

## 2019-12-16 DIAGNOSIS — N189 Chronic kidney disease, unspecified: Secondary | ICD-10-CM | POA: Diagnosis present

## 2019-12-16 DIAGNOSIS — D638 Anemia in other chronic diseases classified elsewhere: Secondary | ICD-10-CM

## 2019-12-16 LAB — CBC WITH DIFFERENTIAL (CANCER CENTER ONLY)
Abs Immature Granulocytes: 0.03 10*3/uL (ref 0.00–0.07)
Basophils Absolute: 0 10*3/uL (ref 0.0–0.1)
Basophils Relative: 0 %
Eosinophils Absolute: 0.3 10*3/uL (ref 0.0–0.5)
Eosinophils Relative: 4 %
HCT: 28 % — ABNORMAL LOW (ref 36.0–46.0)
Hemoglobin: 8.9 g/dL — ABNORMAL LOW (ref 12.0–15.0)
Immature Granulocytes: 0 %
Lymphocytes Relative: 21 %
Lymphs Abs: 1.8 10*3/uL (ref 0.7–4.0)
MCH: 28.5 pg (ref 26.0–34.0)
MCHC: 31.8 g/dL (ref 30.0–36.0)
MCV: 89.7 fL (ref 80.0–100.0)
Monocytes Absolute: 1.1 10*3/uL — ABNORMAL HIGH (ref 0.1–1.0)
Monocytes Relative: 12 %
Neutro Abs: 5.5 10*3/uL (ref 1.7–7.7)
Neutrophils Relative %: 63 %
Platelet Count: 376 10*3/uL (ref 150–400)
RBC: 3.12 MIL/uL — ABNORMAL LOW (ref 3.87–5.11)
RDW: 14.9 % (ref 11.5–15.5)
WBC Count: 8.7 10*3/uL (ref 4.0–10.5)
nRBC: 0 % (ref 0.0–0.2)

## 2019-12-16 MED ORDER — EPOETIN ALFA-EPBX 10000 UNIT/ML IJ SOLN
INTRAMUSCULAR | Status: AC
  Filled 2019-12-16: qty 2

## 2019-12-16 MED ORDER — EPOETIN ALFA-EPBX 10000 UNIT/ML IJ SOLN
20000.0000 [IU] | Freq: Once | INTRAMUSCULAR | Status: AC
  Administered 2019-12-16: 20000 [IU] via SUBCUTANEOUS

## 2019-12-16 NOTE — Patient Instructions (Signed)

## 2019-12-23 ENCOUNTER — Ambulatory Visit (INDEPENDENT_AMBULATORY_CARE_PROVIDER_SITE_OTHER): Payer: Medicare Other | Admitting: Cardiology

## 2019-12-23 ENCOUNTER — Encounter: Payer: Self-pay | Admitting: Cardiology

## 2019-12-23 ENCOUNTER — Other Ambulatory Visit: Payer: Self-pay

## 2019-12-23 VITALS — BP 142/70 | HR 84 | Ht 63.0 in | Wt 152.8 lb

## 2019-12-23 DIAGNOSIS — Z794 Long term (current) use of insulin: Secondary | ICD-10-CM

## 2019-12-23 DIAGNOSIS — E782 Mixed hyperlipidemia: Secondary | ICD-10-CM

## 2019-12-23 DIAGNOSIS — E1139 Type 2 diabetes mellitus with other diabetic ophthalmic complication: Secondary | ICD-10-CM | POA: Diagnosis not present

## 2019-12-23 DIAGNOSIS — I420 Dilated cardiomyopathy: Secondary | ICD-10-CM

## 2019-12-23 NOTE — Progress Notes (Signed)
Cardiology Office Note:    Date:  12/23/2019   ID:  Elizabeth Olsen, DOB 1941-07-13, MRN [id number]  PCP:  [name], MD  Cardiologist:  [name], MD    Referring MD: [name], MD   No chief complaint on file. Doing better  History of Present Illness:    Elizabeth Olsen is a 79 y.o. female with past medical history significant for longstanding poorly controlled diabetes, history of cardiomyopathy with moderately diminished left ventricular ejection fraction however normalization.  Recently she was admitted to hospital because of COVID-19.  She became also anemic while in the hospital.  Luckily recovered quite nicely and she comes today to my office to follow-up.  Overall doing well.  Described to have some shortness of breath but no swelling of lower extremities and her diabetes appears to be better controlled.  Overall she seems to be doing well and I have to admit I see her in better shape that anticipated.  Past Medical History:  Diagnosis Date  . Anemia   . Blood transfusion   . Blood transfusion without reported diagnosis    with breast surgery  . Breast cancer ([place]) 15 years ago   left   . CHF (congestive heart failure) ([place])   . 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 Diabetes mellitus    dx 1998.  was told prior to getting chemo that her bld sugar rose.  She thought it would go back  . Dyspnea    d/t anemia  . GERD (gastroesophageal reflux disease)   . Heart murmur    as child  . Hernia, incisional   . Hyperlipidemia   . Hypertension   . Hypothyroidism   . Vitamin D deficiency     Past Surgical History:  Procedure Laterality Date  . ABDOMINAL HYSTERECTOMY    . APPENDECTOMY    . BREAST SURGERY    . CHOLECYSTECTOMY  01/22/2018   LAPROSCOPIC   . CHOLECYSTECTOMY N/A 01/22/2018   Procedure: LAPAROSCOPIC CHOLECYSTECTOMY WITH INTRAOPERATIVE CHOLANGIOGRAM;  Surgeon: Donnie Mesa, MD;  Location: [facility];  Service: General;  Laterality: N/A;  . COLONOSCOPY   2013   due next 03-2017  . EYE SURGERY     bil cataracts  . IUD REMOVAL     with appendectomy  . MASTECTOMY, MODIFIED RADICAL W/RECONSTRUCTION Left 15 years ago   10 nodes out  . TUMOR EXCISION Left    x 2, neck, head    Current Medications: Current Meds  Medication Sig  . atorvastatin (LIPITOR) 10 MG tablet TAKE 1 TABLET BY MOUTH EVERY DAY ONE TIME ONLY (Patient taking differently: Take 10 mg by mouth daily. )  . calcium carbonate (OSCAL) 1500 (600 Ca) MG TABS tablet Take 600 mg by mouth daily with breakfast.   . carvedilol (COREG) 6.25 MG tablet Take 1 tablet (6.25 mg total) by mouth 2 (two) times daily.  . Continuous Blood Gluc Sensor (FREESTYLE LIBRE 14 DAY SENSOR) MISC See admin instructions.  . fenofibrate 160 MG tablet Take 160 mg by mouth daily.  . fish oil-omega-3 fatty acids 1000 MG capsule Take 1 g by mouth daily.   . furosemide (LASIX) 20 MG tablet TAKE 2 TABLETS BY MOUTH EVERY DAY (Patient taking differently: Take 40 mg by mouth daily. )  . Insulin Glargine (LANTUS SOLOSTAR) 100 UNIT/ML Solostar Pen Inject 4-6 Units into the skin See admin instructions. Use 6 units every morning and use 4 units every evening  . insulin lispro (HUMALOG KWIKPEN)  100 UNIT/ML KiwkPen Inject 5-20 Units into the skin 3 (three) times daily. Sliding Scale  . iron polysaccharides (NIFEREX) 150 MG capsule Take 150 mg by mouth 2 (two) times daily.   Marland Kitchen levothyroxine (SYNTHROID, LEVOTHROID) 112 MCG tablet Take 112 mcg by mouth See admin instructions. Take 1 tablet (112 mcg) by mouth on 6 days of the week; hold on Sundays.  Glory Rosebush VERIO test strip USE TO TEST UP TO 10 TIMES D  . pantoprazole (PROTONIX) 40 MG tablet Take 1 tablet (40 mg total) by mouth 2 (two) times daily.  . vitamin C (ASCORBIC ACID) 500 MG tablet Take 500 mg by mouth daily.  . Vitamin D, Ergocalciferol, (DRISDOL) 50000 UNITS CAPS Take 50,000 Units by mouth 2 (two) times a week. Sundays & Wednesdays.  Marland Kitchen zinc sulfate 220 (50 Zn) MG  capsule Take 1 capsule (220 mg total) by mouth daily.     Allergies:   Erythromycin, Penicillins, Nyquil [[id number]-doxylamine-dm-apap], and Sulfa antibiotics   Social History   Socioeconomic History  . Marital status: Married    Spouse name: Not on file  . Number of children: 3  . Years of education: Not on file  . Highest education level: Not on file  Occupational History  . Occupation: homemaker  Tobacco Use  . Smoking status: Never Smoker  . Smokeless tobacco: Never Used  Substance and Sexual Activity  . Alcohol use: No  . Drug use: No  . Sexual activity: Not on file  Other Topics Concern  . Not on file  Social History Narrative  . Not on file   Social Determinants of Health   Financial Resource Strain:   . Difficulty of Paying Living Expenses: Not on file  Food Insecurity:   . Worried About Charity fundraiser in the Last Year: Not on file  . Ran Out of Food in the Last Year: Not on file  Transportation Needs:   . Lack of Transportation (Medical): Not on file  . Lack of Transportation (Non-Medical): Not on file  Physical Activity:   . Days of Exercise per Week: Not on file  . Minutes of Exercise per Session: Not on file  Stress:   . Feeling of Stress : Not on file  Social Connections:   . Frequency of Communication with Friends and Family: Not on file  . Frequency of Social Gatherings with Friends and Family: Not on file  . Attends Religious Services: Not on file  . Active Member of Clubs or Organizations: Not on file  . Attends Archivist Meetings: Not on file  . Marital Status: Not on file     Family History: The patient's family history includes Congestive Heart Failure in her father; Diabetes in an other family member; Heart attack in her brother; Hypertension in her father, maternal grandfather, and maternal grandmother; Peripheral vascular disease in her father; Stomach cancer in her maternal grandmother. There is no history of Colon cancer,  Esophageal cancer, Pancreatic cancer, or Rectal cancer. ROS:   Please see the history of present illness.    All 14 point review of systems negative except as described per history of present illness  EKGs/Labs/Other Studies Reviewed:      Recent Labs: 07/30/2019: TSH 0.853 08/18/2019: NT-Pro BNP 1,864 11/20/2019: B Natriuretic Peptide 484.5 11/23/2019: Magnesium 2.0 11/25/2019: ALT 22 11/27/2019: BUN 66; Creatinine, Ser 3.18; Potassium 4.9; Sodium 131 12/16/2019: Hemoglobin 8.9; Platelet Count 376  Recent Lipid Panel    Component Value Date/Time   CHOL  184 07/23/2017 1538   TRIG 91 07/23/2017 1538   HDL 77 07/23/2017 1538   CHOLHDL 2.4 07/23/2017 1538   LDLCALC 89 07/23/2017 1538    Physical Exam:    VS:  BP (!) 142/70   Pulse 84   Ht 5\' 3"  (1.6 m)   Wt 152 lb 12.8 oz (69.3 kg)   SpO2 97%   BMI 27.07 kg/m     Wt Readings from Last 3 Encounters:  12/23/19 152 lb 12.8 oz (69.3 kg)  11/26/19 161 lb 2.5 oz (73.1 kg)  10/02/19 154 lb 12.8 oz (70.2 kg)     GEN:  Well nourished, well developed in no acute distress HEENT: Normal NECK: No JVD; No carotid bruits LYMPHATICS: No lymphadenopathy CARDIAC: RRR, no murmurs, no rubs, no gallops RESPIRATORY:  Clear to auscultation without rales, wheezing or rhonchi  ABDOMEN: Soft, non-tender, non-distended MUSCULOSKELETAL:  No edema; No deformity  SKIN: Warm and dry LOWER EXTREMITIES: no swelling NEUROLOGIC:  Alert and oriented x 3 PSYCHIATRIC:  Normal affect   ASSESSMENT:    1. Dilated cardiomyopathy ([place])   2. Type 2 diabetes mellitus with other ophthalmic complication, with long-term current use of insulin ([place])   3. Mixed hyperlipidemia    PLAN:    In order of problems listed above:  1. Dilated cardiomyopathy on appropriate medications.  Make arrangements for Chem-7 to see if there is any room for ACE inhibitor or ARB.  Obviously will look at her kidney function.  We will continue with carvedilol at 6.25 twice  daily. 2. Type 2 diabetes followed by antimedicine team.  Apparently now is better controlled. 3. Mixed dyslipidemia.  I will continue with atorvastatin 10 mg daily.  We will make arrangements for fasting lipid profile to be checked.  She informed today that she most likely will permanently relocated to [place].  I will provide her with the name of cardiologist that fellows of [place] of cardiology so she can be followed up in her new location.   Medication Adjustments/Labs and Tests Ordered: Current medicines are reviewed at length with the patient today.  Concerns regarding medicines are outlined above.  No orders of the defined types were placed in this encounter.  Medication changes: No orders of the defined types were placed in this encounter.   Signed, Park Liter, MD, [facility] 12/23/2019 1:17 PM    [place]

## 2019-12-23 NOTE — Patient Instructions (Signed)

## 2019-12-25 ENCOUNTER — Other Ambulatory Visit: Payer: Self-pay | Admitting: Cardiology

## 2019-12-25 NOTE — Progress Notes (Signed)
[place]   Telephone:([phone]) [phone] Fax:(336) [phone]   Clinic Follow up Note   Patient Care Team: [name], MD as PCP - General (Family Medicine) Park Liter, MD as PCP - Cardiology (Cardiology) Reynold Bowen, MD as Consulting Physician (Endocrinology) Bobbye Charleston, MD as Consulting Physician (Obstetrics and Gynecology)  Date of Service:  12/31/2019  CHIEF COMPLAINT: F/u anemia of chronic disease   CURRENT THERAPY:  Aranesp injection 173mgmonthly started on 01/01/18. Due to insurance was changed to Retacrit every 2 weeks starting 03/11/19.Dose increased to 40K on 12/31/2019   INTERVAL HISTORY:  Elizabeth STRICKis here for a follow up of anemia. She presents to the clinic alone. She notes she has been eating enough and more dark leafy greens. She notes this week her legs and feet have been swelling. She plans to contact her Cardiologist about this. She has been on Lasix with recent dose reduction. She has missed a few appointments for Retacrit due to recent hospitalization after short of breath due to COVID.     REVIEW OF SYSTEMS:   Constitutional: Denies fevers, chills or abnormal weight loss Eyes: Denies blurriness of vision Ears, nose, mouth, throat, and face: Denies mucositis or sore throat Respiratory: Denies cough, dyspnea or wheezes Cardiovascular: Denies palpitation, chest discomfort (+) lower extremity swelling Gastrointestinal:  Denies nausea, heartburn or change in bowel habits Skin: Denies abnormal skin rashes Lymphatics: Denies new lymphadenopathy or easy bruising Neurological:Denies numbness, tingling or new weaknesses Behavioral/Psych: Mood is stable, no new changes  All other systems were reviewed with the patient and are negative.  MEDICAL HISTORY:  Past Medical History:  Diagnosis Date  . Anemia   . Blood transfusion   . Blood transfusion without reported diagnosis    with breast surgery  . Breast cancer [place] [place]  [place]   left   . [place]   . 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 Diabetes mellitus    dx 1998.  was told prior to getting chemo that her bld sugar rose.  She thought it would go back  . Dyspnea    d/t anemia  . GERD (gastroesophageal reflux disease)   . Heart murmur    as child  . Hernia, incisional   . Hyperlipidemia   . Hypertension   . Hypothyroidism   . Vitamin D deficiency     SURGICAL HISTORY: Past Surgical History:  Procedure Laterality Date  . ABDOMINAL HYSTERECTOMY    . APPENDECTOMY    . BREAST SURGERY    . CHOLECYSTECTOMY  01/22/2018   LAPROSCOPIC   . CHOLECYSTECTOMY N/A 01/22/2018   Procedure: LAPAROSCOPIC CHOLECYSTECTOMY WITH INTRAOPERATIVE CHOLANGIOGRAM;  Surgeon: TDonnie Mesa MD;  [facility] [facility]  Service: General;  Laterality: N/A;  . COLONOSCOPY  2013   due next 03-2017  . EYE SURGERY     bil cataracts  . IUD REMOVAL     with appendectomy  . MASTECTOMY, MODIFIED RADICAL W/RECONSTRUCTION Left 15 years ago   10 nodes out  . TUMOR EXCISION Left    x 2, neck, head    I have reviewed the social history and family history with the patient and they are unchanged from previous note.  ALLERGIES:  is allergic to erythromycin; penicillins; nyquil [pseudoeph-doxylamine-dm-apap]; and sulfa antibiotics.  MEDICATIONS:  Current Outpatient Medications  Medication Sig Dispense Refill  . atorvastatin (LIPITOR) 10 MG tablet TAKE 1 TABLET BY MOUTH EVERY DAY ONE TIME ONLY (Patient taking differently: Take 10  mg by mouth daily. ) 90 tablet 2  . calcium carbonate (OSCAL) 1500 (600 Ca) MG TABS tablet Take 600 mg by mouth daily with breakfast.     . carvedilol (COREG) 6.25 MG tablet TAKE 1 TABLET BY MOUTH WITH BREAKFAST & EVENING MEAL EVERY DAY 180 tablet 1  . Continuous Blood Gluc Sensor (FREESTYLE LIBRE 14 DAY SENSOR) MISC See admin instructions.    . fenofibrate 160 MG tablet Take 160 mg by mouth daily.    . fish oil-omega-3 fatty acids 1000  MG capsule Take 1 g by mouth daily.     . furosemide (LASIX) 20 MG tablet Take 2 tablets (40 mg total) by mouth daily. 180 tablet 1  . furosemide (LASIX) 40 MG tablet TAKE 2 TABLETS BY MOUTH AT BREAKFAST EVERY DAY 90 tablet 0  . Insulin Glargine (LANTUS SOLOSTAR) 100 UNIT/ML Solostar Pen Inject 4-6 Units into the skin See admin instructions. Use 6 units every morning and use 4 units every evening    . insulin lispro (HUMALOG KWIKPEN) 100 UNIT/ML KiwkPen Inject 5-20 Units into the skin 3 (three) times daily. Sliding Scale    . iron polysaccharides (NIFEREX) 150 MG capsule Take 150 mg by mouth 2 (two) times daily.     Marland Kitchen levothyroxine (SYNTHROID, LEVOTHROID) 112 MCG tablet Take 112 mcg by mouth See admin instructions. Take 1 tablet (112 mcg) by mouth on 6 days of the week; hold on Sundays.    Glory Rosebush VERIO test strip USE TO TEST UP TO 10 TIMES D  12  . pantoprazole (PROTONIX) 40 MG tablet Take 1 tablet (40 mg total) by mouth 2 (two) times daily. 60 tablet 1  . vitamin C (ASCORBIC ACID) 500 MG tablet Take 500 mg by mouth daily.    . Vitamin D, Ergocalciferol, (DRISDOL) 50000 UNITS CAPS Take 50,000 Units by mouth 2 (two) times a week. Sundays & Wednesdays.    Marland Kitchen zinc sulfate 220 (50 Zn) MG capsule Take 1 capsule (220 mg total) by mouth daily.     No current facility-administered medications for this visit.    PHYSICAL EXAMINATION: ECOG PERFORMANCE STATUS: 2 - Symptomatic, <50% confined to bed  Vitals:   12/31/19 1011  BP: 136/61  Pulse: 80  Resp: 16  Temp: 98 F (36.7 C)  SpO2: 100%   Filed Weights   12/31/19 1011  Weight: 158 lb 9.6 oz (71.9 kg)    Due to COVID19 we will limit examination to appearance. Patient had no complaints.  GENERAL:alert, no distress and comfortable SKIN: skin color normal, no rashes or significant lesions EYES: normal, Conjunctiva are pink and non-injected, sclera clear  NEURO: alert & oriented x 3 with fluent speech   LABORATORY DATA:  I have reviewed  the data as listed CBC Latest Ref Rng & Units 12/31/2019 12/16/2019 11/26/2019  WBC 4.0 - 10.5 K/uL 8.2 8.7 12.0(H)  Hemoglobin 12.0 - 15.0 g/dL 8.1(L) 8.9(L) 9.4(L)  Hematocrit 36.0 - 46.0 % 26.2(L) 28.0(L) 29.3(L)  Platelets 150 - 400 K/uL 279 376 474(H)     CMP Latest Ref Rng & Units 11/27/2019 11/26/2019 11/25/2019  Glucose 70 - 99 mg/dL 366(H) 64(L) 292(H)  BUN 8 - 23 mg/dL 66(H) 81(H) 83(H)  Creatinine 0.44 - 1.00 mg/dL 3.18(H) 3.18(H) 3.51(H)  Sodium 135 - 145 mmol/L 131(L) 137 134(L)  Potassium 3.5 - 5.1 mmol/L 4.9 4.2 4.3  Chloride 98 - 111 mmol/L 103 103 99  CO2 22 - 32 mmol/L '23 24 23  ' Calcium 8.9 -  10.3 mg/dL 7.7(L) 8.0(L) 8.4(L)  Total Protein 6.5 - 8.1 g/dL - - 5.6(L)  Total Bilirubin 0.3 - 1.2 mg/dL - - 0.9  Alkaline Phos 38 - 126 U/L - - 58  AST 15 - 41 U/L - - 32  ALT 0 - 44 U/L - - 22      RADIOGRAPHIC STUDIES: I have personally reviewed the radiological images as listed and agreed with the findings in the report. No results found.   ASSESSMENT & PLAN:  AUBRIEE SZETO is a 79 y.o. female with    1. Anemia of chronic diseases  -I previously reviewed her CBC and anemia workup, she had normal ferritin, iron study, B14 and folic acid in 7829, ret was low, normal MCV. No lab evidence for hemolysis or multiple myeloma. Her erythropoietin level was normal. -She does have chronic disease including uncontrolled diabetes, congestive heart failure, and mild CKD, which can cause anemia of chronic disease -Herbone marrow biopsy in 11/21/17 showedno evidence of MDS or other primary marrow disease. I think this is consistent with anemia of chronic disease.  -Since her Hgb is below 9 most of time, she started monthlyAranespinjectionin 12/2017, she is tolerating well.Due to insurance her Aranesp was changed to Retacrit every 2 weeks on 03/11/19 with goal Hg 9-11.We discussedthe risk of thrombosis from above, especially when hemoglobin is above 11.She is tolerating well, and  will continue -She missed a few treatments in 10/2019-12/2019 due to COVID infection and hospital stay. Labs reviewed, Hg decreased to 8.1. No need for blood transfusion today, but if she becomes more SOB or Hg drops 7.5-8 or lower will give blood.  -I will increase her Retacrit dose given drop. Will proceed with Retacrit today and continue every 2 weeks.  -F/u in 12 weeks.   2. CHFand hypertension, LE edema  -Continue to f/u with cardiologist, Dr. Agustin Cree regularly. She takes 40 mg Lasixand potassium, HTN not well controlled.  -She has been having LE edema recently. I recommend she f/u with her Cardiologist about adjusting Lasix dose.   3. DMwith hyperglycemia -DM poorly controlled.  -She will continue to f/u with Dr. Forde Dandy for management  4. Stage III CKD -Likely secondary to her diabetes.  -I previously advised her to drink plenty of fluids and to control her diabetes.  5.Hypothyroidism, Significant Fatigue  -She is on Levothyroxine. She is also on Vit D 50,000 units twice a week. This is managed by Dr. Forde Dandy -Her 07/01/19 TSH was low at 0.264, lower than normal. I suspect her significant fatigue is related to his thyroid function. I recommend she see Dr. Forde Dandy for dose adjustment.   6. Cancer screenings  -2020 Mammogram and DEXA was normal -Her last colonoscopy in 07/2017 with Dr. Cristobal Goldmann 1 benign polyp removed, otherwise normal.   7. COVID19 (+)  -Per patient se was found to have COVID after 10/2019 hospitalization. She was SOB for a limited time but stayed in hospital longer due to kidney function at the time. She has recovered well.   PLAN: -Labs reviewed, proceed with Retacrit injection today, will increase dose to 40K. No need for Blood transfusion today  -Lab and Retacrit injection every 2 weeks with dose increase X6.  -F/u in 12 weeks    No problem-specific Assessment & Plan notes found for this encounter.   No orders of the defined types were placed  in this encounter.  All questions were answered. The patient knows to call the clinic with any problems, questions or  concerns. No barriers to learning was detected. The total time spent in the appointment was 20 minutes.     Truitt Merle, MD 12/31/2019   I, Joslyn Devon, am acting as scribe for Truitt Merle, MD.   I have reviewed the above documentation for accuracy and completeness, and I agree with the above.

## 2019-12-26 ENCOUNTER — Ambulatory Visit: Payer: Medicare Other

## 2019-12-26 ENCOUNTER — Other Ambulatory Visit: Payer: Self-pay

## 2019-12-26 MED ORDER — FUROSEMIDE 20 MG PO TABS: 40.0000 mg | ORAL_TABLET | Freq: Every day | ORAL | 1 refills | Status: DC

## 2019-12-26 MED ORDER — CARVEDILOL 6.25 MG PO TABS: ORAL_TABLET | ORAL | 1 refills | Status: DC

## 2019-12-29 ENCOUNTER — Telehealth: Payer: Self-pay

## 2019-12-29 NOTE — Telephone Encounter (Signed)
CARVEDILOL 6.25MG . TAKE 1 TABLET BY MOUTH WITH BREAKFAST & EVENING MEAL EVERY DAY. #90 last filled 12/25/2019. Documented as sent #180 with 1 refill written 12/26/19 & sent to [facility] at [address] [place], [place].

## 2019-12-31 ENCOUNTER — Other Ambulatory Visit: Payer: Self-pay

## 2019-12-31 ENCOUNTER — Inpatient Hospital Stay (HOSPITAL_BASED_OUTPATIENT_CLINIC_OR_DEPARTMENT_OTHER): Payer: Medicare Other | Admitting: Hematology

## 2019-12-31 ENCOUNTER — Inpatient Hospital Stay: Payer: Medicare Other | Attending: Hematology

## 2019-12-31 ENCOUNTER — Inpatient Hospital Stay: Payer: Medicare Other

## 2019-12-31 ENCOUNTER — Telehealth: Payer: Self-pay | Admitting: Cardiology

## 2019-12-31 ENCOUNTER — Encounter: Payer: Self-pay | Admitting: Hematology

## 2019-12-31 VITALS — BP 136/61 | HR 80 | Temp 98.0°F | Resp 16 | Ht 63.0 in | Wt 158.6 lb

## 2019-12-31 DIAGNOSIS — D638 Anemia in other chronic diseases classified elsewhere: Secondary | ICD-10-CM | POA: Diagnosis not present

## 2019-12-31 DIAGNOSIS — N189 Chronic kidney disease, unspecified: Secondary | ICD-10-CM | POA: Insufficient documentation

## 2019-12-31 DIAGNOSIS — I420 Dilated cardiomyopathy: Secondary | ICD-10-CM

## 2019-12-31 DIAGNOSIS — I5021 Acute systolic (congestive) heart failure: Secondary | ICD-10-CM

## 2019-12-31 DIAGNOSIS — I10 Essential (primary) hypertension: Secondary | ICD-10-CM

## 2019-12-31 DIAGNOSIS — D631 Anemia in chronic kidney disease: Secondary | ICD-10-CM | POA: Insufficient documentation

## 2019-12-31 LAB — CBC WITH DIFFERENTIAL (CANCER CENTER ONLY)
Abs Immature Granulocytes: 0.03 10*3/uL (ref 0.00–0.07)
Basophils Absolute: 0 10*3/uL (ref 0.0–0.1)
Basophils Relative: 0 %
Eosinophils Absolute: 0.2 10*3/uL (ref 0.0–0.5)
Eosinophils Relative: 3 %
HCT: 26.2 % — ABNORMAL LOW (ref 36.0–46.0)
Hemoglobin: 8.1 g/dL — ABNORMAL LOW (ref 12.0–15.0)
Immature Granulocytes: 0 %
Lymphocytes Relative: 18 %
Lymphs Abs: 1.5 10*3/uL (ref 0.7–4.0)
MCH: 28.3 pg (ref 26.0–34.0)
MCHC: 30.9 g/dL (ref 30.0–36.0)
MCV: 91.6 fL (ref 80.0–100.0)
Monocytes Absolute: 1 10*3/uL (ref 0.1–1.0)
Monocytes Relative: 12 %
Neutro Abs: 5.4 10*3/uL (ref 1.7–7.7)
Neutrophils Relative %: 67 %
Platelet Count: 279 10*3/uL (ref 150–400)
RBC: 2.86 MIL/uL — ABNORMAL LOW (ref 3.87–5.11)
RDW: 15.4 % (ref 11.5–15.5)
WBC Count: 8.2 10*3/uL (ref 4.0–10.5)
nRBC: 0 % (ref 0.0–0.2)

## 2019-12-31 MED ORDER — EPOETIN ALFA-EPBX 40000 UNIT/ML IJ SOLN
INTRAMUSCULAR | Status: AC
  Filled 2019-12-31: qty 1

## 2019-12-31 MED ORDER — EPOETIN ALFA-EPBX 10000 UNIT/ML IJ SOLN
INTRAMUSCULAR | Status: AC
  Filled 2019-12-31: qty 1

## 2019-12-31 MED ORDER — EPOETIN ALFA-EPBX 40000 UNIT/ML IJ SOLN
40000.0000 [IU] | Freq: Once | INTRAMUSCULAR | Status: AC
  Administered 2019-12-31: 40000 [IU] via SUBCUTANEOUS

## 2019-12-31 NOTE — Telephone Encounter (Signed)
Pt c/o swelling: STAT is pt has developed SOB within 24 hours  1) How much weight have you gained and in what time span? 5 lbs in four days  2) If swelling, where is the swelling located? Feet and legs  3) Are you currently taking a fluid pill? yes  4) Are you currently SOB? no  5) Do you have a log of your daily weights (if so, list)? no  6) Have you gained 3 pounds in a day or 5 pounds in a week? Yes 5 lbs in four days  7) Have you traveled recently? No  Patient states she has gained 5 pounds in four days and has been having swelling in her legs and feet. She states yesterday it was worse and that she is also fatigued. Please advise.

## 2019-12-31 NOTE — Patient Instructions (Addendum)

## 2019-12-31 NOTE — Telephone Encounter (Signed)
Returned call to patient no answer.LMTC. 

## 2020-01-01 ENCOUNTER — Telehealth: Payer: Self-pay | Admitting: Hematology

## 2020-01-01 ENCOUNTER — Other Ambulatory Visit: Payer: Self-pay

## 2020-01-01 NOTE — Telephone Encounter (Signed)
Spoke with the pt and she is reporting increased bilateral lower extremity edema up to her calf.   She can wear her normal shoes but they are tight, she is SOB with exertion but feels well at rest. At bedtime she is able to lay flat but has an adjustable bed and usually sleeps with the head up some and her feet elevated.   Pt denies added NA in her diet.   I have asked her to continue to elevate her feet quite a bit every time she is sitting. To be sure that even though she does not add NA to her diet she needs to review labels for frozen foods and can foods, anything that is processed.   Pt says that she has had compression stockings in the past but they were too difficult to get on. I advised her to talk with the [facility] or [facility] [employer] and they can help to better find ones that fit her properly.   Will also forward to Dr. Agustin Cree for review. Pt seemed unclear about her meds but advised me she is only taking Lasix 20mg  2 po in the morning.   Even though there was another Lasix listed on her med list also which I took off of the med list based on what the pt says she is on. It was for 40 mg 2 po bid.

## 2020-01-01 NOTE — Telephone Encounter (Signed)
Scheduled appt per 3/3 los.  Spoke with pt and she is aware of her appt date and time,

## 2020-01-02 ENCOUNTER — Telehealth: Payer: Self-pay | Admitting: Cardiology

## 2020-01-02 NOTE — Telephone Encounter (Signed)
This is a big problem she does have significant kidney dysfunction.  What I understand she is taking 20mg  of Lasix twice daily.  Please increase to 40 mg in the morning and 20 mg in the afternoon.  On Monday she need to have Chem-7 as well as proBNP short of breath she need to go to the emergency room

## 2020-01-02 NOTE — Telephone Encounter (Signed)
Patient called today because she has not heard anything from Dr. Raliegh Ip since she spoke with the nurse yesterday morning. She continues to elevate her feet with an adjustable bed, but is worried about not hearing anything before the weekend.  Please call to discuss swelling

## 2020-01-02 NOTE — Telephone Encounter (Signed)
Called patient informed her to increase lasix to 40 mg in the morning and 20 mg in the evening and have labs drawn on Monday she understands. She will also go to the emergency department if symptoms abruptly get worse over the weekend.

## 2020-01-02 NOTE — Telephone Encounter (Signed)
Mickel Baas with [facility] states she is calling to inquire about the correct dosage for the patient's furosemide (LASIX) 20 MG tablet medication. She states that the pharmacy has received duplicate prescriptions with different intake instructions and would like to ensure that the patient should take 2 tablets (40 mg total) by mouth daily.   Mickel Baas states a call may be returned to her personal number as well as [facility]. Her personal number will be found in Dr. Wendy Poet secure chat.  [facility]: [phone]

## 2020-01-05 NOTE — Telephone Encounter (Signed)
Follow Up:      Mickel Baas calling, to find out there correct dosage for pt's Furosemide,

## 2020-01-05 NOTE — Telephone Encounter (Signed)
Spoke with Mickel Baas. Confirmed that the patient was instructed to increase Lasix to 40 mg in the AM and 20 mg in the PM last Friday. She reports the patient's pillpack has Lasix 40 mg (2 tablets) daily. Called and spoke with the patient who states she is feeling much better. She thinks she has been taking Lasix 40 mg in the AM and 20 mg in the PM (she is using some of her 20 mg tablets she already had), but that is not what the pharmacist said was dispensed.  If she is taking what was dispensed, she has been taking 80 mg in the morning and 20 mg in the PM.  The patient was unaware she was to have repeat labs drawn today.  She will go to the office and have drawn now.  Once labs are resulted, Mickel Baas requests the new/updated Lasix dosage be sent to [facility] so the pillpack may be corrected.

## 2020-01-06 ENCOUNTER — Telehealth: Payer: Self-pay

## 2020-01-06 NOTE — Telephone Encounter (Signed)
I just received a Critical Lab Glucose of 582 from [facility]).

## 2020-01-06 NOTE — Telephone Encounter (Signed)
Called patient and informed her of glucose 582 asked her if she had taken it today she said yes and that it was coming up "greater than 600". I advised her this is dangerous and she needs to go to the emergency room per Dr. Agustin Cree she stated "I will take a high dose of insulin" I informed her not to do this and go to the emergency department she said she understood.

## 2020-01-07 LAB — BASIC METABOLIC PANEL
BUN/Creatinine Ratio: 18 (ref 12–28)
BUN: 35 mg/dL — ABNORMAL HIGH (ref 8–27)
CO2: 26 mmol/L (ref 20–29)
Calcium: 9.1 mg/dL (ref 8.7–10.3)
Chloride: 94 mmol/L — ABNORMAL LOW (ref 96–106)
Creatinine, Ser: 1.94 mg/dL — ABNORMAL HIGH (ref 0.57–1.00)
GFR calc Af Amer: 28 mL/min/{1.73_m2} — ABNORMAL LOW (ref >59)
GFR calc non Af Amer: 24 mL/min/{1.73_m2} — ABNORMAL LOW (ref >59)
Glucose: 582 mg/dL (ref 65–99)
Potassium: 4.2 mmol/L (ref 3.5–5.2)
Sodium: 136 mmol/L (ref 134–144)

## 2020-01-07 LAB — PRO B NATRIURETIC PEPTIDE: NT-Pro BNP: 6121 pg/mL — ABNORMAL HIGH (ref 0–738)

## 2020-01-09 NOTE — Telephone Encounter (Addendum)
Called patient to see how she is doing. She did not go to the emergency department as advised however she went to see endocrinologist on 01/09/2020.

## 2020-01-12 NOTE — Telephone Encounter (Signed)
Spoke with Dr. Agustin Cree about this patient, he wants to know how she is doing today. I have tried to call her. There was no answer and voicemail full will continue efforts.

## 2020-01-13 ENCOUNTER — Inpatient Hospital Stay: Payer: Medicare Other

## 2020-01-13 ENCOUNTER — Other Ambulatory Visit: Payer: Self-pay

## 2020-01-13 VITALS — BP 138/60 | HR 79 | Temp 98.2°F | Resp 16

## 2020-01-13 DIAGNOSIS — D638 Anemia in other chronic diseases classified elsewhere: Secondary | ICD-10-CM

## 2020-01-13 DIAGNOSIS — N189 Chronic kidney disease, unspecified: Secondary | ICD-10-CM | POA: Diagnosis not present

## 2020-01-13 LAB — CBC WITH DIFFERENTIAL (CANCER CENTER ONLY)
Abs Immature Granulocytes: 0.03 10*3/uL (ref 0.00–0.07)
Basophils Absolute: 0 10*3/uL (ref 0.0–0.1)
Basophils Relative: 1 %
Eosinophils Absolute: 0.3 10*3/uL (ref 0.0–0.5)
Eosinophils Relative: 3 %
HCT: 31.1 % — ABNORMAL LOW (ref 36.0–46.0)
Hemoglobin: 9.4 g/dL — ABNORMAL LOW (ref 12.0–15.0)
Immature Granulocytes: 0 %
Lymphocytes Relative: 19 %
Lymphs Abs: 1.7 10*3/uL (ref 0.7–4.0)
MCH: 27.6 pg (ref 26.0–34.0)
MCHC: 30.2 g/dL (ref 30.0–36.0)
MCV: 91.2 fL (ref 80.0–100.0)
Monocytes Absolute: 1 10*3/uL (ref 0.1–1.0)
Monocytes Relative: 12 %
Neutro Abs: 5.7 10*3/uL (ref 1.7–7.7)
Neutrophils Relative %: 65 %
Platelet Count: 354 10*3/uL (ref 150–400)
RBC: 3.41 MIL/uL — ABNORMAL LOW (ref 3.87–5.11)
RDW: 15.6 % — ABNORMAL HIGH (ref 11.5–15.5)
WBC Count: 8.8 10*3/uL (ref 4.0–10.5)
nRBC: 0 % (ref 0.0–0.2)

## 2020-01-13 MED ORDER — EPOETIN ALFA-EPBX 40000 UNIT/ML IJ SOLN
40000.0000 [IU] | Freq: Once | INTRAMUSCULAR | Status: AC
  Administered 2020-01-13: 40000 [IU] via SUBCUTANEOUS

## 2020-01-13 MED ORDER — EPOETIN ALFA-EPBX 40000 UNIT/ML IJ SOLN
INTRAMUSCULAR | Status: AC
  Filled 2020-01-13: qty 1

## 2020-01-13 NOTE — Patient Instructions (Signed)

## 2020-01-14 ENCOUNTER — Other Ambulatory Visit: Payer: Medicare Other

## 2020-01-14 ENCOUNTER — Ambulatory Visit: Payer: Medicare Other

## 2020-01-15 NOTE — Telephone Encounter (Signed)
Patient is doing okay. She denies any shortness of breath or swelling. She is still taking lasix 40 in the morning and 20 mg in the evening. She saw endocrinologist on 01/09/2020. Told patient to call us with any changes in shortness of breath or swelling she verbally understood. No further questions.

## 2020-01-15 NOTE — Telephone Encounter (Signed)
Patient is doing fine she is taking lasix 40 mg In the morning and 20 mg in the evening. She is not having issues with shortness of breath or swelling I have asked her to let us know if this changes. Will confirm with Dr. Agustin Cree or DOD if anything else is needed further.

## 2020-01-26 ENCOUNTER — Other Ambulatory Visit: Payer: Self-pay | Admitting: Cardiology

## 2020-01-26 MED ORDER — FUROSEMIDE 20 MG PO TABS: 20.0000 mg | ORAL_TABLET | Freq: Three times a day (TID) | ORAL | 2 refills | Status: DC

## 2020-01-26 NOTE — Telephone Encounter (Signed)
Spoke with pt and clarified she is taking Furosemide 20 mg 3 times a day.rx sent to [facility].

## 2020-01-26 NOTE — Telephone Encounter (Signed)
Encounter not needed

## 2020-01-26 NOTE — Telephone Encounter (Signed)
°*  STAT* If patient is at the pharmacy, call can be transferred to refill team.   1. Which medications need to be refilled? (please list name of each medication and dose if known) Lasix  Please confirm mg  2. Which pharmacy/location (including street and city if local pharmacy) is medication to be sent to? [facility]  3. Do they need a 30 day or 90 day supply? 30 with 2 refills patient is out. Please verified this is the amount patient should be taking.

## 2020-01-28 ENCOUNTER — Inpatient Hospital Stay: Payer: Medicare Other

## 2020-02-10 ENCOUNTER — Inpatient Hospital Stay: Payer: Medicare Other

## 2020-02-10 ENCOUNTER — Other Ambulatory Visit: Payer: Self-pay

## 2020-02-10 ENCOUNTER — Inpatient Hospital Stay: Payer: Medicare Other | Attending: Hematology

## 2020-02-10 VITALS — BP 124/64 | HR 78 | Temp 98.5°F | Resp 18

## 2020-02-10 DIAGNOSIS — E1165 Type 2 diabetes mellitus with hyperglycemia: Secondary | ICD-10-CM | POA: Insufficient documentation

## 2020-02-10 DIAGNOSIS — D638 Anemia in other chronic diseases classified elsewhere: Secondary | ICD-10-CM | POA: Insufficient documentation

## 2020-02-10 DIAGNOSIS — Z794 Long term (current) use of insulin: Secondary | ICD-10-CM | POA: Diagnosis not present

## 2020-02-10 LAB — CMP (CANCER CENTER ONLY)
ALT: 16 U/L (ref 0–44)
AST: 24 U/L (ref 15–41)
Albumin: 3.1 g/dL — ABNORMAL LOW (ref 3.5–5.0)
Alkaline Phosphatase: 71 U/L (ref 38–126)
Anion gap: 7 (ref 5–15)
BUN: 45 mg/dL — ABNORMAL HIGH (ref 8–23)
CO2: 32 mmol/L (ref 22–32)
Calcium: 9.3 mg/dL (ref 8.9–10.3)
Chloride: 101 mmol/L (ref 98–111)
Creatinine: 2 mg/dL — ABNORMAL HIGH (ref 0.44–1.00)
GFR, Est AFR Am: 27 mL/min — ABNORMAL LOW (ref >60)
GFR, Estimated: 23 mL/min — ABNORMAL LOW (ref >60)
Glucose, Bld: 161 mg/dL — ABNORMAL HIGH (ref 70–99)
Potassium: 4.9 mmol/L (ref 3.5–5.1)
Sodium: 140 mmol/L (ref 135–145)
Total Bilirubin: 0.4 mg/dL (ref 0.3–1.2)
Total Protein: 6.8 g/dL (ref 6.5–8.1)

## 2020-02-10 LAB — CBC WITH DIFFERENTIAL (CANCER CENTER ONLY)
Abs Immature Granulocytes: 0.03 10*3/uL (ref 0.00–0.07)
Basophils Absolute: 0 10*3/uL (ref 0.0–0.1)
Basophils Relative: 0 %
Eosinophils Absolute: 0.3 10*3/uL (ref 0.0–0.5)
Eosinophils Relative: 4 %
HCT: 33.2 % — ABNORMAL LOW (ref 36.0–46.0)
Hemoglobin: 10.1 g/dL — ABNORMAL LOW (ref 12.0–15.0)
Immature Granulocytes: 0 %
Lymphocytes Relative: 22 %
Lymphs Abs: 1.9 10*3/uL (ref 0.7–4.0)
MCH: 27.5 pg (ref 26.0–34.0)
MCHC: 30.4 g/dL (ref 30.0–36.0)
MCV: 90.5 fL (ref 80.0–100.0)
Monocytes Absolute: 0.9 10*3/uL (ref 0.1–1.0)
Monocytes Relative: 10 %
Neutro Abs: 5.5 10*3/uL (ref 1.7–7.7)
Neutrophils Relative %: 64 %
Platelet Count: 369 10*3/uL (ref 150–400)
RBC: 3.67 MIL/uL — ABNORMAL LOW (ref 3.87–5.11)
RDW: 14.3 % (ref 11.5–15.5)
WBC Count: 8.7 10*3/uL (ref 4.0–10.5)
nRBC: 0 % (ref 0.0–0.2)

## 2020-02-10 LAB — FERRITIN: Ferritin: 158 ng/mL (ref 11–307)

## 2020-02-10 MED ORDER — EPOETIN ALFA-EPBX 40000 UNIT/ML IJ SOLN
INTRAMUSCULAR | Status: AC
  Filled 2020-02-10: qty 1

## 2020-02-10 MED ORDER — EPOETIN ALFA-EPBX 40000 UNIT/ML IJ SOLN
40000.0000 [IU] | Freq: Once | INTRAMUSCULAR | Status: AC
  Administered 2020-02-10: 40000 [IU] via SUBCUTANEOUS

## 2020-02-10 NOTE — Patient Instructions (Signed)
Darbepoetin Alfa injection What is this medicine? DARBEPOETIN ALFA (dar be POE e tin AL fa) helps your body make more red blood cells. It is used to treat anemia caused by chronic kidney failure and chemotherapy. This medicine may be used for other purposes; ask your health care provider or pharmacist if you have questions. COMMON BRAND NAME(S): Aranesp What should I tell my health care provider before I take this medicine? They need to know if you have any of these conditions:  blood clotting disorders or history of blood clots  cancer patient not on chemotherapy  cystic fibrosis  heart disease, such as angina, heart failure, or a history of a heart attack  hemoglobin level of 12 g/dL or greater  high blood pressure  low levels of folate, iron, or vitamin B12  seizures  an unusual or allergic reaction to darbepoetin, erythropoietin, albumin, hamster proteins, latex, other medicines, foods, dyes, or preservatives  pregnant or trying to get pregnant  breast-feeding How should I use this medicine? This medicine is for injection into a vein or under the skin. It is usually given by a health care professional in a hospital or clinic setting. If you get this medicine at home, you will be taught how to prepare and give this medicine. Use exactly as directed. Take your medicine at regular intervals. Do not take your medicine more often than directed. It is important that you put your used needles and syringes in a special sharps container. Do not put them in a trash can. If you do not have a sharps container, call your pharmacist or healthcare provider to get one. A special MedGuide will be given to you by the pharmacist with each prescription and refill. Be sure to read this information carefully each time. Talk to your pediatrician regarding the use of this medicine in children. While this medicine may be used in children as young as 1 month of age for selected conditions, precautions do  apply. Overdosage: If you think you have taken too much of this medicine contact a poison control center or emergency room at once. NOTE: This medicine is only for you. Do not share this medicine with others. What if I miss a dose? If you miss a dose, take it as soon as you can. If it is almost time for your next dose, take only that dose. Do not take double or extra doses. What may interact with this medicine? Do not take this medicine with any of the following medications:  epoetin alfa This list may not describe all possible interactions. Give your health care provider a list of all the medicines, herbs, non-prescription drugs, or dietary supplements you use. Also tell them if you smoke, drink alcohol, or use illegal drugs. Some items may interact with your medicine. What should I watch for while using this medicine? Your condition will be monitored carefully while you are receiving this medicine. You may need blood work done while you are taking this medicine. This medicine may cause a decrease in vitamin B6. You should make sure that you get enough vitamin B6 while you are taking this medicine. Discuss the foods you eat and the vitamins you take with your health care professional. What side effects may I notice from receiving this medicine? Side effects that you should report to your doctor or health care professional as soon as possible:  allergic reactions like skin rash, itching or hives, swelling of the face, lips, or tongue  breathing problems  changes in   vision  chest pain  confusion, trouble speaking or understanding  feeling faint or lightheaded, falls  high blood pressure  muscle aches or pains  pain, swelling, warmth in the leg  rapid weight gain  severe headaches  sudden numbness or weakness of the face, arm or leg  trouble walking, dizziness, loss of balance or coordination  seizures (convulsions)  swelling of the ankles, feet, hands  unusually weak or  tired Side effects that usually do not require medical attention (report to your doctor or health care professional if they continue or are bothersome):  diarrhea  fever, chills (flu-like symptoms)  headaches  nausea, vomiting  redness, stinging, or swelling at site where injected This list may not describe all possible side effects. Call your doctor for medical advice about side effects. You may report side effects to FDA at 1-800-FDA-1088. Where should I keep my medicine? Keep out of the reach of children. Store in a refrigerator between 2 and 8 degrees C (36 and 46 degrees F). Do not freeze. Do not shake. Throw away any unused portion if using a single-dose vial. Throw away any unused medicine after the expiration date. NOTE: This sheet is a summary. It may not cover all possible information. If you have questions about this medicine, talk to your doctor, pharmacist, or health care provider.  2020 Elsevier/Gold Standard (2017-10-31 16:44:20)  

## 2020-02-11 ENCOUNTER — Other Ambulatory Visit: Payer: Medicare Other

## 2020-02-11 ENCOUNTER — Ambulatory Visit: Payer: Medicare Other

## 2020-02-19 ENCOUNTER — Telehealth: Payer: Self-pay | Admitting: Cardiology

## 2020-02-19 NOTE — Telephone Encounter (Signed)
° °  Pt c/o medication issue:  1. Name of Medication: furosemide (LASIX) 20 MG tablet  2. How are you currently taking this medication (dosage and times per day)? Take 1 tablet (20 mg total) by mouth 3 (three) times daily.  3. Are you having a reaction (difficulty breathing--STAT)?   4. What is your medication issue? Pt's daughter calling, she said her Mom was recently seen by another doctor and change her lasix to take one every other day, they weren't sure if thats ok since pt retains a lot of fluids. They would like to hear Dr. Agustin Cree thought if its ok for the pt to lower the dosage of her lasix   Please advise

## 2020-02-19 NOTE — Telephone Encounter (Signed)
Wondering why was a suggestion to decrease her furosemide, I suspect kidney deterioration.  It is fine to take only 1 tablet of Lasix every day, but she need to check her weight every single day in the morning.  If she see her weight going up by 2 pounds in 2 consecutive days she is to go back to at least 2 tablets every day.

## 2020-02-20 NOTE — Telephone Encounter (Signed)
Left message on patients voicemail to please return our call.   

## 2020-02-20 NOTE — Telephone Encounter (Signed)
Patient returning Morgan's call.

## 2020-02-20 NOTE — Telephone Encounter (Signed)
Spoke with patient regarding recommendations.  Patient verbalizes understanding and is agreeable to plan of care. Advised patient to call back with any issues or concerns.

## 2020-02-25 ENCOUNTER — Other Ambulatory Visit: Payer: Self-pay

## 2020-02-25 ENCOUNTER — Inpatient Hospital Stay: Payer: Medicare Other

## 2020-02-25 VITALS — BP 142/65 | HR 75 | Temp 98.5°F | Resp 18

## 2020-02-25 DIAGNOSIS — D638 Anemia in other chronic diseases classified elsewhere: Secondary | ICD-10-CM

## 2020-02-25 DIAGNOSIS — E1165 Type 2 diabetes mellitus with hyperglycemia: Secondary | ICD-10-CM | POA: Diagnosis not present

## 2020-02-25 LAB — CBC WITH DIFFERENTIAL (CANCER CENTER ONLY)
Abs Immature Granulocytes: 0.02 10*3/uL (ref 0.00–0.07)
Basophils Absolute: 0 10*3/uL (ref 0.0–0.1)
Basophils Relative: 1 %
Eosinophils Absolute: 0.3 10*3/uL (ref 0.0–0.5)
Eosinophils Relative: 4 %
HCT: 31.6 % — ABNORMAL LOW (ref 36.0–46.0)
Hemoglobin: 9.6 g/dL — ABNORMAL LOW (ref 12.0–15.0)
Immature Granulocytes: 0 %
Lymphocytes Relative: 22 %
Lymphs Abs: 1.6 10*3/uL (ref 0.7–4.0)
MCH: 27.9 pg (ref 26.0–34.0)
MCHC: 30.4 g/dL (ref 30.0–36.0)
MCV: 91.9 fL (ref 80.0–100.0)
Monocytes Absolute: 1.1 10*3/uL — ABNORMAL HIGH (ref 0.1–1.0)
Monocytes Relative: 14 %
Neutro Abs: 4.6 10*3/uL (ref 1.7–7.7)
Neutrophils Relative %: 59 %
Platelet Count: 307 10*3/uL (ref 150–400)
RBC: 3.44 MIL/uL — ABNORMAL LOW (ref 3.87–5.11)
RDW: 15.9 % — ABNORMAL HIGH (ref 11.5–15.5)
WBC Count: 7.6 10*3/uL (ref 4.0–10.5)
nRBC: 0 % (ref 0.0–0.2)

## 2020-02-25 MED ORDER — EPOETIN ALFA-EPBX 40000 UNIT/ML IJ SOLN
INTRAMUSCULAR | Status: AC
  Filled 2020-02-25: qty 1

## 2020-02-25 MED ORDER — EPOETIN ALFA-EPBX 40000 UNIT/ML IJ SOLN
40000.0000 [IU] | Freq: Once | INTRAMUSCULAR | Status: AC
  Administered 2020-02-25: 10:00:00 40000 [IU] via SUBCUTANEOUS

## 2020-02-25 NOTE — Patient Instructions (Signed)
Darbepoetin Alfa injection What is this medicine? DARBEPOETIN ALFA (dar be POE e tin AL fa) helps your body make more red blood cells. It is used to treat anemia caused by chronic kidney failure and chemotherapy. This medicine may be used for other purposes; ask your health care provider or pharmacist if you have questions. COMMON BRAND NAME(S): Aranesp What should I tell my health care provider before I take this medicine? They need to know if you have any of these conditions:  blood clotting disorders or history of blood clots  cancer patient not on chemotherapy  cystic fibrosis  heart disease, such as angina, heart failure, or a history of a heart attack  hemoglobin level of 12 g/dL or greater  high blood pressure  low levels of folate, iron, or vitamin B12  seizures  an unusual or allergic reaction to darbepoetin, erythropoietin, albumin, hamster proteins, latex, other medicines, foods, dyes, or preservatives  pregnant or trying to get pregnant  breast-feeding How should I use this medicine? This medicine is for injection into a vein or under the skin. It is usually given by a health care professional in a hospital or clinic setting. If you get this medicine at home, you will be taught how to prepare and give this medicine. Use exactly as directed. Take your medicine at regular intervals. Do not take your medicine more often than directed. It is important that you put your used needles and syringes in a special sharps container. Do not put them in a trash can. If you do not have a sharps container, call your pharmacist or healthcare provider to get one. A special MedGuide will be given to you by the pharmacist with each prescription and refill. Be sure to read this information carefully each time. Talk to your pediatrician regarding the use of this medicine in children. While this medicine may be used in children as young as 1 month of age for selected conditions, precautions do  apply. Overdosage: If you think you have taken too much of this medicine contact a poison control center or emergency room at once. NOTE: This medicine is only for you. Do not share this medicine with others. What if I miss a dose? If you miss a dose, take it as soon as you can. If it is almost time for your next dose, take only that dose. Do not take double or extra doses. What may interact with this medicine? Do not take this medicine with any of the following medications:  epoetin alfa This list may not describe all possible interactions. Give your health care provider a list of all the medicines, herbs, non-prescription drugs, or dietary supplements you use. Also tell them if you smoke, drink alcohol, or use illegal drugs. Some items may interact with your medicine. What should I watch for while using this medicine? Your condition will be monitored carefully while you are receiving this medicine. You may need blood work done while you are taking this medicine. This medicine may cause a decrease in vitamin B6. You should make sure that you get enough vitamin B6 while you are taking this medicine. Discuss the foods you eat and the vitamins you take with your health care professional. What side effects may I notice from receiving this medicine? Side effects that you should report to your doctor or health care professional as soon as possible:  allergic reactions like skin rash, itching or hives, swelling of the face, lips, or tongue  breathing problems  changes in   vision  chest pain  confusion, trouble speaking or understanding  feeling faint or lightheaded, falls  high blood pressure  muscle aches or pains  pain, swelling, warmth in the leg  rapid weight gain  severe headaches  sudden numbness or weakness of the face, arm or leg  trouble walking, dizziness, loss of balance or coordination  seizures (convulsions)  swelling of the ankles, feet, hands  unusually weak or  tired Side effects that usually do not require medical attention (report to your doctor or health care professional if they continue or are bothersome):  diarrhea  fever, chills (flu-like symptoms)  headaches  nausea, vomiting  redness, stinging, or swelling at site where injected This list may not describe all possible side effects. Call your doctor for medical advice about side effects. You may report side effects to FDA at 1-800-FDA-1088. Where should I keep my medicine? Keep out of the reach of children. Store in a refrigerator between 2 and 8 degrees C (36 and 46 degrees F). Do not freeze. Do not shake. Throw away any unused portion if using a single-dose vial. Throw away any unused medicine after the expiration date. NOTE: This sheet is a summary. It may not cover all possible information. If you have questions about this medicine, talk to your doctor, pharmacist, or health care provider.  2020 Elsevier/Gold Standard (2017-10-31 16:44:20)  

## 2020-03-10 ENCOUNTER — Inpatient Hospital Stay: Payer: Medicare Other | Attending: Hematology

## 2020-03-10 ENCOUNTER — Inpatient Hospital Stay: Payer: Medicare Other

## 2020-03-10 ENCOUNTER — Other Ambulatory Visit: Payer: Self-pay

## 2020-03-10 VITALS — BP 180/70 | HR 84 | Temp 98.3°F | Resp 20

## 2020-03-10 DIAGNOSIS — E1165 Type 2 diabetes mellitus with hyperglycemia: Secondary | ICD-10-CM | POA: Insufficient documentation

## 2020-03-10 DIAGNOSIS — D638 Anemia in other chronic diseases classified elsewhere: Secondary | ICD-10-CM | POA: Insufficient documentation

## 2020-03-10 LAB — CBC WITH DIFFERENTIAL (CANCER CENTER ONLY)
Abs Immature Granulocytes: 0.03 10*3/uL (ref 0.00–0.07)
Basophils Absolute: 0.1 10*3/uL (ref 0.0–0.1)
Basophils Relative: 1 %
Eosinophils Absolute: 0.3 10*3/uL (ref 0.0–0.5)
Eosinophils Relative: 4 %
HCT: 33.5 % — ABNORMAL LOW (ref 36.0–46.0)
Hemoglobin: 10.1 g/dL — ABNORMAL LOW (ref 12.0–15.0)
Immature Granulocytes: 0 %
Lymphocytes Relative: 24 %
Lymphs Abs: 2 10*3/uL (ref 0.7–4.0)
MCH: 27.8 pg (ref 26.0–34.0)
MCHC: 30.1 g/dL (ref 30.0–36.0)
MCV: 92.3 fL (ref 80.0–100.0)
Monocytes Absolute: 1.1 10*3/uL — ABNORMAL HIGH (ref 0.1–1.0)
Monocytes Relative: 14 %
Neutro Abs: 4.6 10*3/uL (ref 1.7–7.7)
Neutrophils Relative %: 57 %
Platelet Count: 343 10*3/uL (ref 150–400)
RBC: 3.63 MIL/uL — ABNORMAL LOW (ref 3.87–5.11)
RDW: 15.9 % — ABNORMAL HIGH (ref 11.5–15.5)
WBC Count: 8.1 10*3/uL (ref 4.0–10.5)
nRBC: 0 % (ref 0.0–0.2)

## 2020-03-10 MED ORDER — EPOETIN ALFA-EPBX 40000 UNIT/ML IJ SOLN
INTRAMUSCULAR | Status: AC
  Filled 2020-03-10: qty 1

## 2020-03-10 MED ORDER — EPOETIN ALFA-EPBX 40000 UNIT/ML IJ SOLN
40000.0000 [IU] | Freq: Once | INTRAMUSCULAR | Status: AC
  Administered 2020-03-10: 10:00:00 40000 [IU] via SUBCUTANEOUS

## 2020-03-10 NOTE — Progress Notes (Signed)
Per MD ok to give injection with current BP. Also patient stated she wasn't feeling well. We checked her blood sugar and it was 54, gave her juice and a donut. She stayed here for about 45 mins and then her blood sugar was 89 and she felt better to drive. Made MD aware.

## 2020-03-10 NOTE — Patient Instructions (Signed)

## 2020-03-16 ENCOUNTER — Other Ambulatory Visit: Payer: Self-pay

## 2020-03-17 ENCOUNTER — Other Ambulatory Visit: Payer: Self-pay

## 2020-03-17 ENCOUNTER — Encounter: Payer: Self-pay | Admitting: Cardiology

## 2020-03-17 ENCOUNTER — Ambulatory Visit (INDEPENDENT_AMBULATORY_CARE_PROVIDER_SITE_OTHER): Payer: Medicare Other | Admitting: Cardiology

## 2020-03-17 VITALS — BP 178/78 | HR 80 | Ht 63.0 in | Wt 152.0 lb

## 2020-03-17 DIAGNOSIS — I10 Essential (primary) hypertension: Secondary | ICD-10-CM | POA: Diagnosis not present

## 2020-03-17 DIAGNOSIS — I420 Dilated cardiomyopathy: Secondary | ICD-10-CM | POA: Diagnosis not present

## 2020-03-17 DIAGNOSIS — I5031 Acute diastolic (congestive) heart failure: Secondary | ICD-10-CM

## 2020-03-17 MED ORDER — HYDRALAZINE HCL 10 MG PO TABS
10.0000 mg | ORAL_TABLET | Freq: Three times a day (TID) | ORAL | 1 refills | Status: DC
Start: 2020-03-17 — End: 2020-04-12

## 2020-03-17 NOTE — Patient Instructions (Signed)
Medication Instructions:  Your physician has recommended you make the following change in your medication:   START: Hydralazine 10 mg three times daily   *If you need a refill on your cardiac medications before your next appointment, please call your pharmacy*   Lab Work: Your physician recommends that you return for lab work today: tsh, cmp, cbc, pro bnp  If you have labs (blood work) drawn today and your tests are completely normal, you will receive your results only by: Marland Kitchen MyChart Message (if you have MyChart) OR . A paper copy in the mail If you have any lab test that is abnormal or we need to change your treatment, we will call you to review the results.   Testing/Procedures: None.    Follow-Up: At [facility], you and your health needs are our priority.  As part of our continuing mission to provide you with exceptional heart care, we have created designated Provider Care Teams.  These Care Teams include your primary Cardiologist (physician) and Advanced Practice Providers (APPs -  Physician Assistants and Nurse Practitioners) who all work together to provide you with the care you need, when you need it.  We recommend signing up for the patient portal called "MyChart".  Sign up information is provided on this After Visit Summary.  MyChart is used to connect with patients for Virtual Visits (Telemedicine).  Patients are able to view lab/test results, encounter notes, upcoming appointments, etc.  Non-urgent messages can be sent to your provider as well.   To learn more about what you can do with MyChart, go to [email or web address].    Your next appointment:   1 month(s)  The format for your next appointment:   In Person  Provider:   [name], MD   Other Instructions  Hydralazine tablets What is this medicine? HYDRALAZINE (hye DRAL a zeen) is a type of vasodilator. It relaxes blood vessels, increasing the blood and oxygen supply to your heart. This medicine is  used to treat high blood pressure. This medicine may be used for other purposes; ask your health care provider or pharmacist if you have questions. COMMON BRAND NAME(S): Apresoline What should I tell my health care provider before I take this medicine? They need to know if you have any of these conditions:  blood vessel disease  heart disease including angina or history of heart attack  kidney or liver disease  systemic lupus erythematosus (SLE)  an unusual or allergic reaction to hydralazine, tartrazine dye, other medicines, foods, dyes, or preservatives  pregnant or trying to get pregnant  breast-feeding How should I use this medicine? Take this medicine by mouth with a glass of water. Follow the directions on the prescription label. Take your doses at regular intervals. Do not take your medicine more often than directed. Do not stop taking except on the advice of your doctor or health care professional. Talk to your pediatrician regarding the use of this medicine in children. Special care may be needed. While this drug may be prescribed for children for selected conditions, precautions do apply. Overdosage: If you think you have taken too much of this medicine contact a poison control center or emergency room at once. NOTE: This medicine is only for you. Do not share this medicine with others. What if I miss a dose? If you miss a dose, take it as soon as you can. If it is almost time for your next dose, take only that dose. Do not take double or extra doses. What  may interact with this medicine?  medicines for high blood pressure  medicines for mental depression This list may not describe all possible interactions. Give your health care provider a list of all the medicines, herbs, non-prescription drugs, or dietary supplements you use. Also tell them if you smoke, drink alcohol, or use illegal drugs. Some items may interact with your medicine. What should I watch for while using  this medicine? Visit your doctor or health care professional for regular checks on your progress. Check your blood pressure and pulse rate regularly. Ask your doctor or health care professional what your blood pressure and pulse rate should be and when you should contact him or her. You may get drowsy or dizzy. Do not drive, use machinery, or do anything that needs mental alertness until you know how this medicine affects you. Do not stand or sit up quickly, especially if you are an older patient. This reduces the risk of dizzy or fainting spells. Alcohol may interfere with the effect of this medicine. Avoid alcoholic drinks. Do not treat yourself for coughs, colds, or pain while you are taking this medicine without asking your doctor or health care professional for advice. Some ingredients may increase your blood pressure. What side effects may I notice from receiving this medicine? Side effects that you should report to your doctor or health care professional as soon as possible:  chest pain, or fast or irregular heartbeat  fever, chills, or sore throat  numbness or tingling in the hands or feet  shortness of breath  skin rash, redness, blisters or itching  stiff or swollen joints  sudden weight gain  swelling of the feet or legs  swollen lymph glands  unusual weakness Side effects that usually do not require medical attention (report to your doctor or health care professional if they continue or are bothersome):  diarrhea, or constipation  headache  loss of appetite  nausea, vomiting This list may not describe all possible side effects. Call your doctor for medical advice about side effects. You may report side effects to FDA at 1-800-FDA-1088. Where should I keep my medicine? Keep out of the reach of children. Store at room temperature between 15 and 30 degrees C (59 and 86 degrees F). Throw away any unused medicine after the expiration date. NOTE: This sheet is a summary.  It may not cover all possible information. If you have questions about this medicine, talk to your doctor, pharmacist, or health care provider.  2020 Elsevier/Gold Standard (2008-02-28 15:44:58)

## 2020-03-17 NOTE — Progress Notes (Signed)
Cardiology Office Note:    Date:  03/17/2020   ID:  Elizabeth Olsen, DOB 02-18-1941, MRN [id number]  PCP:  [name], MD  Cardiologist:  [name], MD    Referring MD: [name], MD   Chief Complaint  Patient presents with  . Follow-up  Swollen  History of Present Illness:    Elizabeth Olsen is a 79 y.o. female with past medical history significant for cardiomyopathy with improvement left ventricular ejection fraction but still some component of diastolic dysfunction, she has got chronic kidney failure, anemia, diabetes which was poorly controlled but now much better.  Comes today to my office complaining of having swelling of lower extremities.  He she was told by nephrologist to discontinue her furosemide she was taking only 20 mg daily which led to worsening of swelling of lower extremities as well as shortness of breath.  She been taking higher dose of diuretic for last few weeks and she is feeling significantly better.  Denies have any chest pain tightness squeezing pressure burning chest.  She is taking of her sick husband who looks like suffering from depression.   Past Medical History:  Diagnosis Date  . Acquired hypothyroidism 10/13/2017  . Acute diastolic CHF (congestive heart failure) ([place]) 11/20/2019  . Acute respiratory failure with hypoxia ([place]) 10/13/2017  . Acute systolic heart failure ([place]) 10/13/2017  . Anemia   . Anemia associated with diabetes mellitus ([place]) 10/13/2017  . Anemia of chronic disease 12/10/2017  . Blood transfusion   . Blood transfusion without reported diagnosis    with breast surgery  . Breast cancer ([place]) 15 years ago   left   . Cardiomyopathy ([place]) 07/23/2017   Ejection fraction 45-50% may; from September 2018  . CHF (congestive heart failure) ([place])   . 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 COVID-19 virus infection 11/20/2019  . Diabetes mellitus    dx 1998.  was told prior to  getting chemo that her bld sugar rose.  She thought it would go back  . Dyspnea    d/t anemia  . Essential hypertension 09/11/2018  . GERD (gastroesophageal reflux disease)   . Heart murmur    as child  . Hernia, incisional   . Hyperlipidemia   . Hypertension   . Hypothyroidism   . Iron deficiency 07/23/2017  . Near syncope 04/01/2018  . Refractory anemia, unspecified ([place]) 12/10/2017  . Type 1 diabetes ([place]) 07/23/2017  . Vertigo 06/24/2018  . Vitamin D deficiency     Past Surgical History:  Procedure Laterality Date  . ABDOMINAL HYSTERECTOMY    . APPENDECTOMY    . BREAST SURGERY    . CHOLECYSTECTOMY  01/22/2018   LAPROSCOPIC   . CHOLECYSTECTOMY N/A 01/22/2018   Procedure: LAPAROSCOPIC CHOLECYSTECTOMY WITH INTRAOPERATIVE CHOLANGIOGRAM;  Surgeon: Donnie Mesa, MD;  Location: [place];  Service: General;  Laterality: N/A;  . COLONOSCOPY  2013   due next 03-2017  . EYE SURGERY     bil cataracts  . IUD REMOVAL     with appendectomy  . MASTECTOMY, MODIFIED RADICAL W/RECONSTRUCTION Left 15 years ago   10 nodes out  . TUMOR EXCISION Left    x 2, neck, head    Current Medications: Current Meds  Medication Sig  . amLODipine (NORVASC) 5 MG tablet Take 5 mg by mouth every morning.  . ASPIRIN ADULT LOW STRENGTH 81 MG EC tablet Take 81 mg by mouth every morning.  Marland Kitchen  atorvastatin (LIPITOR) 10 MG tablet TAKE 1 TABLET BY MOUTH EVERY DAY ONE TIME ONLY (Patient taking differently: Take 10 mg by mouth daily. )  . calcium carbonate (OSCAL) 1500 (600 Ca) MG TABS tablet Take 600 mg by mouth daily with breakfast.   . carvedilol (COREG) 6.25 MG tablet TAKE 1 TABLET BY MOUTH WITH BREAKFAST & EVENING MEAL EVERY DAY  . Continuous Blood Gluc Sensor (FREESTYLE LIBRE 14 DAY SENSOR) MISC See admin instructions.  . fenofibrate 160 MG tablet Take 160 mg by mouth daily.  . fish oil-omega-3 fatty acids 1000 MG capsule Take 1 g by mouth daily.   . furosemide (LASIX) 20 MG tablet Take 1 tablet (20 mg total) by  mouth 3 (three) times daily.  Marland Kitchen HUMALOG 100 UNIT/ML injection USE as directed via insulin pump. max total daily DOSE of 50 UNITS  . insulin glargine (LANTUS SOLOSTAR) 100 UNIT/ML Solostar Pen Inject 7 Units into the skin in the morning. And 6 units at night  . iron polysaccharides (NIFEREX) 150 MG capsule Take 150 mg by mouth 2 (two) times daily.   Marland Kitchen levothyroxine (SYNTHROID) 112 MCG tablet Take one tablet 5 days of the week and HOLD 2 days of the week.  Glory Rosebush VERIO test strip USE TO TEST UP TO 10 TIMES D  . pantoprazole (PROTONIX) 40 MG tablet Take 1 tablet (40 mg total) by mouth 2 (two) times daily.  . vitamin C (ASCORBIC ACID) 500 MG tablet Take 500 mg by mouth daily.  . Vitamin D, Ergocalciferol, (DRISDOL) 50000 UNITS CAPS Take 50,000 Units by mouth 2 (two) times a week. Sundays & Wednesdays.  Marland Kitchen zinc sulfate 220 (50 Zn) MG capsule Take 1 capsule (220 mg total) by mouth daily.     Allergies:   Erythromycin, Penicillins, Nyquil [[id number]-doxylamine-dm-apap], and Sulfa antibiotics   Social History   Socioeconomic History  . Marital status: Married    Spouse name: Not on file  . Number of children: 3  . Years of education: Not on file  . Highest education level: Not on file  Occupational History  . Occupation: homemaker  Tobacco Use  . Smoking status: Never Smoker  . Smokeless tobacco: Never Used  Substance and Sexual Activity  . Alcohol use: No  . Drug use: No  . Sexual activity: Not on file  Other Topics Concern  . Not on file  Social History Narrative  . Not on file   Social Determinants of Health   Financial Resource Strain:   . Difficulty of Paying Living Expenses:   Food Insecurity:   . Worried About Charity fundraiser in the Last Year:   . Arboriculturist in the Last Year:   Transportation Needs:   . Film/video editor (Medical):   Marland Kitchen Lack of Transportation (Non-Medical):   Physical Activity:   . Days of Exercise per Week:   . Minutes of Exercise per  Session:   Stress:   . Feeling of Stress :   Social Connections:   . Frequency of Communication with Friends and Family:   . Frequency of Social Gatherings with Friends and Family:   . Attends Religious Services:   . Active Member of Clubs or Organizations:   . Attends Archivist Meetings:   Marland Kitchen Marital Status:      Family History: The patient's family history includes Congestive Heart Failure in her father; Diabetes in an other family member; Heart attack in her brother; Hypertension in her father, maternal  grandfather, and maternal grandmother; Peripheral vascular disease in her father; Stomach cancer in her maternal grandmother. There is no history of Colon cancer, Esophageal cancer, Pancreatic cancer, or Rectal cancer. ROS:   Please see the history of present illness.    All 14 point review of systems negative except as described per history of present illness  EKGs/Labs/Other Studies Reviewed:      Recent Labs: 07/30/2019: TSH 0.853 11/20/2019: B Natriuretic Peptide 484.5 11/23/2019: Magnesium 2.0 01/05/2020: NT-Pro BNP 6,121 02/10/2020: ALT 16; BUN 45; Creatinine 2.00; Potassium 4.9; Sodium 140 03/10/2020: Hemoglobin 10.1; Platelet Count 343  Recent Lipid Panel    Component Value Date/Time   CHOL 184 07/23/2017 1538   TRIG 91 07/23/2017 1538   HDL 77 07/23/2017 1538   CHOLHDL 2.4 07/23/2017 1538   LDLCALC 89 07/23/2017 1538    Physical Exam:    VS:  BP (!) 178/78   Pulse 80   Ht 5\' 3"  (1.6 m)   Wt 152 lb (68.9 kg)   SpO2 98%   BMI 26.93 kg/m     Wt Readings from Last 3 Encounters:  03/17/20 152 lb (68.9 kg)  12/31/19 158 lb 9.6 oz (71.9 kg)  12/23/19 152 lb 12.8 oz (69.3 kg)     GEN:  Well nourished, well developed in no acute distress HEENT: Normal NECK: No JVD; No carotid bruits LYMPHATICS: No lymphadenopathy CARDIAC: RRR, systolic murmur grade 1/6 best heard at the right upper portion of the sternum, no rubs, no gallops RESPIRATORY:  Clear to  auscultation without rales, wheezing or rhonchi  ABDOMEN: Soft, non-tender, non-distended MUSCULOSKELETAL:  No edema; No deformity  SKIN: Warm and dry LOWER EXTREMITIES: 1+ swelling NEUROLOGIC:  Alert and oriented x 3 PSYCHIATRIC:  Normal affect   ASSESSMENT:    1. Dilated cardiomyopathy ([place])   2. Essential hypertension   3. Acute diastolic CHF (congestive heart failure) ([facility])    PLAN:    In order of problems listed above:  1. Congestive heart failure, appears to be decompensated.  I will ask her to have a proBNP CHEM 12 done today.  I will also check her CBC as well as TSH.  I will not alter any of her medication right now.  She apparently take 20 mg of furosemide 2 tablets in the morning and 1 tablet in the afternoon.  She also takes potassium. 2. Essential hypertension blood pressure elevated I will add hydralazine 10 mg 3 times daily to her medical regimen. 3. Diabetes much better controlled right now she does have continues sugar monitor as well as a insulin pump.  That improved the situation dramatically.  She used to have hemoglobin A1c very elevated.  Now better.  It looks like she lost some weight as well. 4. Overall she is decompensated from congestive heart failure point of view.  Slightly better but still not up to par.  She is very sad about her husband's situation he is getting up and he simply wants to die and she is taking care of him.   Medication Adjustments/Labs and Tests Ordered: Current medicines are reviewed at length with the patient today.  Concerns regarding medicines are outlined above.  No orders of the defined types were placed in this encounter.  Medication changes: No orders of the defined types were placed in this encounter.   Signed, Park Liter, MD, [facility] 03/17/2020 11:09 AM    [employer]

## 2020-03-18 ENCOUNTER — Emergency Department (HOSPITAL_COMMUNITY)
Admission: EM | Admit: 2020-03-18 | Discharge: 2020-03-19 | Disposition: A | Discharge status: Home / Self Care | Payer: Medicare Other | Attending: Emergency Medicine | Admitting: Emergency Medicine

## 2020-03-18 ENCOUNTER — Other Ambulatory Visit: Payer: Self-pay

## 2020-03-18 ENCOUNTER — Encounter (HOSPITAL_COMMUNITY): Payer: Self-pay | Admitting: *Deleted

## 2020-03-18 ENCOUNTER — Telehealth: Payer: Self-pay | Admitting: Medical

## 2020-03-18 DIAGNOSIS — I110 Hypertensive heart disease with heart failure: Secondary | ICD-10-CM | POA: Diagnosis not present

## 2020-03-18 DIAGNOSIS — Z79899 Other long term (current) drug therapy: Secondary | ICD-10-CM | POA: Insufficient documentation

## 2020-03-18 DIAGNOSIS — E109 Type 1 diabetes mellitus without complications: Secondary | ICD-10-CM | POA: Insufficient documentation

## 2020-03-18 DIAGNOSIS — Z794 Long term (current) use of insulin: Secondary | ICD-10-CM | POA: Diagnosis not present

## 2020-03-18 DIAGNOSIS — N289 Disorder of kidney and ureter, unspecified: Secondary | ICD-10-CM | POA: Diagnosis not present

## 2020-03-18 DIAGNOSIS — I5031 Acute diastolic (congestive) heart failure: Secondary | ICD-10-CM | POA: Insufficient documentation

## 2020-03-18 DIAGNOSIS — I159 Secondary hypertension, unspecified: Secondary | ICD-10-CM

## 2020-03-18 LAB — COMPREHENSIVE METABOLIC PANEL
ALT: 15 IU/L (ref 0–32)
AST: 21 IU/L (ref 0–40)
Albumin/Globulin Ratio: 1.2 (ref 1.2–2.2)
Albumin: 3.7 g/dL (ref 3.7–4.7)
Alkaline Phosphatase: 90 IU/L (ref 48–121)
BUN/Creatinine Ratio: 16 (ref 12–28)
BUN: 38 mg/dL — ABNORMAL HIGH (ref 8–27)
Bilirubin Total: 0.2 mg/dL (ref 0.0–1.2)
CO2: 23 mmol/L (ref 20–29)
Calcium: 9.3 mg/dL (ref 8.7–10.3)
Chloride: 100 mmol/L (ref 96–106)
Creatinine, Ser: 2.39 mg/dL — ABNORMAL HIGH (ref 0.57–1.00)
GFR calc Af Amer: 22 mL/min/{1.73_m2} — ABNORMAL LOW (ref >59)
GFR calc non Af Amer: 19 mL/min/{1.73_m2} — ABNORMAL LOW (ref >59)
Globulin, Total: 3 g/dL (ref 1.5–4.5)
Glucose: 192 mg/dL — ABNORMAL HIGH (ref 65–99)
Potassium: 3.9 mmol/L (ref 3.5–5.2)
Sodium: 142 mmol/L (ref 134–144)
Total Protein: 6.7 g/dL (ref 6.0–8.5)

## 2020-03-18 LAB — TSH: TSH: 1.64 u[IU]/mL (ref 0.450–4.500)

## 2020-03-18 LAB — CBC
Hematocrit: 34.3 % (ref 34.0–46.6)
Hemoglobin: 10.9 g/dL — ABNORMAL LOW (ref 11.1–15.9)
MCH: 28.3 pg (ref 26.6–33.0)
MCHC: 31.8 g/dL (ref 31.5–35.7)
MCV: 89 fL (ref 79–97)
Platelets: 505 10*3/uL — ABNORMAL HIGH (ref 150–450)
RBC: 3.85 x10E6/uL (ref 3.77–5.28)
RDW: 15 % (ref 11.7–15.4)
WBC: 9.1 10*3/uL (ref 3.4–10.8)

## 2020-03-18 LAB — PRO B NATRIURETIC PEPTIDE: NT-Pro BNP: 3968 pg/mL — ABNORMAL HIGH (ref 0–738)

## 2020-03-18 NOTE — Telephone Encounter (Signed)
Received a call from the daughter about patient's high BP. She said that at her recent apt BP was high and hydralazine 10 mg TID was added to her daily amlodipine 5mg . She took her BP tonight and it was 187/90. Patient denied chest pain, sob, dizziness, lightheadedness, headache. She didn't feel out of the ordinary. I recommended she take her third hydralazine and then check her BP 30 minutes later. If it's still high or higher than she should be seen in the the ED. If it's improved than she should be seen tomorrow in the clinic. Patient does not take her Bps daily and is unsure of her baseline. Because she was asymptomatic she preferred not going to the ED but was ultimately willing to. Will let Dr. Agustin Cree know,   Noelle Sease Kathlen Mody, PA-C

## 2020-03-18 NOTE — ED Triage Notes (Signed)
PT says that her blood pressure has been high SBP "180's". States she saw her cardiologist yesterday and they adjusted her BP meds, now taking hydralazine in addition to her other BP meds. Says she called him this evening and he wanted her seen in the ED for further eval. Denies CP, headache, dizziness, or headache.

## 2020-03-19 ENCOUNTER — Encounter (HOSPITAL_COMMUNITY): Payer: Self-pay | Admitting: Emergency Medicine

## 2020-03-19 DIAGNOSIS — I110 Hypertensive heart disease with heart failure: Secondary | ICD-10-CM | POA: Diagnosis not present

## 2020-03-19 LAB — I-STAT CHEM 8, ED
BUN: 37 mg/dL — ABNORMAL HIGH (ref 8–23)
Calcium, Ion: 1.1 mmol/L — ABNORMAL LOW (ref 1.15–1.40)
Chloride: 100 mmol/L (ref 98–111)
Creatinine, Ser: 2 mg/dL — ABNORMAL HIGH (ref 0.44–1.00)
Glucose, Bld: 123 mg/dL — ABNORMAL HIGH (ref 70–99)
HCT: 29 % — ABNORMAL LOW (ref 36.0–46.0)
Hemoglobin: 9.9 g/dL — ABNORMAL LOW (ref 12.0–15.0)
Potassium: 4.3 mmol/L (ref 3.5–5.1)
Sodium: 137 mmol/L (ref 135–145)
TCO2: 31 mmol/L (ref 22–32)

## 2020-03-19 LAB — URINALYSIS, ROUTINE W REFLEX MICROSCOPIC
Bacteria, UA: NONE SEEN
Bilirubin Urine: NEGATIVE
Glucose, UA: 50 mg/dL — AB
Hgb urine dipstick: NEGATIVE
Ketones, ur: NEGATIVE mg/dL
Leukocytes,Ua: NEGATIVE
Nitrite: NEGATIVE
Protein, ur: >=300 mg/dL — AB
Specific Gravity, Urine: 1.004 — ABNORMAL LOW (ref 1.005–1.030)
pH: 7 (ref 5.0–8.0)

## 2020-03-19 LAB — CBC WITH DIFFERENTIAL/PLATELET
Abs Immature Granulocytes: 0.03 10*3/uL (ref 0.00–0.07)
Basophils Absolute: 0 10*3/uL (ref 0.0–0.1)
Basophils Relative: 1 %
Eosinophils Absolute: 0.3 10*3/uL (ref 0.0–0.5)
Eosinophils Relative: 3 %
HCT: 32.2 % — ABNORMAL LOW (ref 36.0–46.0)
Hemoglobin: 9.9 g/dL — ABNORMAL LOW (ref 12.0–15.0)
Immature Granulocytes: 0 %
Lymphocytes Relative: 24 %
Lymphs Abs: 2 10*3/uL (ref 0.7–4.0)
MCH: 28 pg (ref 26.0–34.0)
MCHC: 30.7 g/dL (ref 30.0–36.0)
MCV: 91 fL (ref 80.0–100.0)
Monocytes Absolute: 1.2 10*3/uL — ABNORMAL HIGH (ref 0.1–1.0)
Monocytes Relative: 14 %
Neutro Abs: 4.9 10*3/uL (ref 1.7–7.7)
Neutrophils Relative %: 58 %
Platelets: 400 10*3/uL (ref 150–400)
RBC: 3.54 MIL/uL — ABNORMAL LOW (ref 3.87–5.11)
RDW: 16.1 % — ABNORMAL HIGH (ref 11.5–15.5)
WBC: 8.5 10*3/uL (ref 4.0–10.5)
nRBC: 0 % (ref 0.0–0.2)

## 2020-03-19 NOTE — ED Notes (Signed)
Discharge instructions including follow up care discussed with pt. Pt verbalized understanding with no questions at this time. Pt to go home with family.

## 2020-03-19 NOTE — ED Provider Notes (Signed)
[facility] EMERGENCY DEPARTMENT Provider Note   CSN: [id number] Arrival date & time: 03/18/20  2213     History Chief Complaint  Patient presents with  . Hypertension    Elizabeth Olsen is a 79 y.o. female.  The history is provided by the patient.  Hypertension This is a chronic problem. The current episode started more than 1 week ago. The problem occurs constantly. The problem has been gradually worsening. Pertinent negatives include no chest pain, no abdominal pain, no headaches and no shortness of breath. Nothing aggravates the symptoms. Nothing relieves the symptoms. Treatments tried: starting increased dose of BP medication. The treatment provided no relief.  Seen by cardiology and increased BP medication.  Just started them but hasn't even gotten the full 3 doses in but called cardiology and was referred.      Past Medical History:  Diagnosis Date  . Acquired hypothyroidism 10/13/2017  . Acute diastolic CHF (congestive heart failure) ([place]) 11/20/2019  . Acute respiratory failure with hypoxia ([place]) 10/13/2017  . Acute systolic heart failure ([place]) 10/13/2017  . Anemia   . Anemia associated with diabetes mellitus ([place]) 10/13/2017  . Anemia of chronic disease 12/10/2017  . Blood transfusion   . Blood transfusion without reported diagnosis    with breast surgery  . Breast cancer ([place]) 15 years ago   left   . Cardiomyopathy ([place]) 07/23/2017   Ejection fraction 45-50% may; from September 2018  . CHF (congestive heart failure) ([place])   . 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 COVID-19 virus infection 11/20/2019  . Diabetes mellitus    dx 1998.  was told prior to getting chemo that her bld sugar rose.  She thought it would go back  . Dyspnea    d/t anemia  . Essential hypertension 09/11/2018  . GERD (gastroesophageal reflux disease)   . Heart murmur    as child  . Hernia, incisional   . Hyperlipidemia   .  Hypertension   . Hypothyroidism   . Iron deficiency 07/23/2017  . Near syncope 04/01/2018  . Refractory anemia, unspecified ([place]) 12/10/2017  . Type 1 diabetes ([place]) 07/23/2017  . Vertigo 06/24/2018  . Vitamin D deficiency     Patient Active Problem List   Diagnosis Date Noted  . Dyspnea 11/20/2019  . Acute diastolic CHF (congestive heart failure) ([place]) 11/20/2019  . COVID-19 virus infection 11/20/2019  . Vitamin D deficiency 10/06/2019  . Essential hypertension 09/11/2018  . Vertigo 06/24/2018  . Near syncope 04/01/2018  . 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 Anemia of chronic disease 12/10/2017  . Refractory anemia, unspecified ([place]) 12/10/2017  . Anemia 11/07/2017  . Acute systolic heart failure ([place]) 10/13/2017  . Acute respiratory failure with hypoxia ([place]) 10/13/2017  . Acquired hypothyroidism 10/13/2017  . Anemia associated with diabetes mellitus ([place]) 10/13/2017  . Type 1 diabetes ([place]) 07/23/2017  . Hyperlipidemia 07/23/2017  . Iron deficiency 07/23/2017  . Cardiomyopathy ([place]) 07/23/2017    Past Surgical History:  Procedure Laterality Date  . ABDOMINAL HYSTERECTOMY    . APPENDECTOMY    . BREAST SURGERY    . CHOLECYSTECTOMY  01/22/2018   LAPROSCOPIC   . CHOLECYSTECTOMY N/A 01/22/2018   Procedure: LAPAROSCOPIC CHOLECYSTECTOMY WITH INTRAOPERATIVE CHOLANGIOGRAM;  Surgeon: Donnie Mesa, MD;  Location: [place];  Service: General;  Laterality: N/A;  . COLONOSCOPY  2013   due next 03-2017  . EYE SURGERY     bil cataracts  .  IUD REMOVAL     with appendectomy  . MASTECTOMY, MODIFIED RADICAL W/RECONSTRUCTION Left 15 years ago   10 nodes out  . TUMOR EXCISION Left    x 2, neck, head     OB History   No obstetric history on file.     Family History  Problem Relation Age of Onset  . Diabetes Other        both sides of family  . Hypertension Father   . Congestive Heart Failure Father   . Peripheral vascular disease Father   . Hypertension Maternal  Grandfather   . Hypertension Maternal Grandmother   . Stomach cancer Maternal Grandmother        GGM  . Heart attack Brother   . Colon cancer Neg Hx   . Esophageal cancer Neg Hx   . Pancreatic cancer Neg Hx   . Rectal cancer Neg Hx     Social History   Tobacco Use  . Smoking status: Never Smoker  . Smokeless tobacco: Never Used  Substance Use Topics  . Alcohol use: No  . Drug use: No    Home Medications Prior to Admission medications   Medication Sig Start Date End Date Taking? Authorizing Provider  amLODipine (NORVASC) 5 MG tablet Take 5 mg by mouth every morning. 02/24/20   [provider]  ASPIRIN ADULT LOW STRENGTH 81 MG EC tablet Take 81 mg by mouth every morning. 12/26/19   [provider]  atorvastatin (LIPITOR) 10 MG tablet TAKE 1 TABLET BY MOUTH EVERY DAY ONE TIME ONLY Patient taking differently: Take 10 mg by mouth daily.  08/20/18   Park Liter, MD  calcium carbonate (OSCAL) 1500 (600 Ca) MG TABS tablet Take 600 mg by mouth daily with breakfast.     [provider]  carvedilol (COREG) 6.25 MG tablet TAKE 1 TABLET BY MOUTH WITH BREAKFAST & EVENING MEAL EVERY DAY 12/26/19   Park Liter, MD  Continuous Blood Gluc Sensor (FREESTYLE LIBRE 14 DAY SENSOR) MISC See admin instructions. 10/22/19   [provider]  fenofibrate 160 MG tablet Take 160 mg by mouth daily.    [provider]  fish oil-omega-3 fatty acids 1000 MG capsule Take 1 g by mouth daily.     [provider]  furosemide (LASIX) 20 MG tablet Take 1 tablet (20 mg total) by mouth 3 (three) times daily. 01/26/20   Park Liter, MD  HUMALOG 100 UNIT/ML injection USE as directed via insulin pump. max total daily DOSE of 50 UNITS 02/12/20   [provider]  hydrALAZINE (APRESOLINE) 10 MG tablet Take 1 tablet (10 mg total) by mouth 3 (three) times daily. 03/17/20 06/15/20  Park Liter, MD  insulin glargine (LANTUS SOLOSTAR) 100 UNIT/ML  Solostar Pen Inject 7 Units into the skin in the morning. And 6 units at night 01/26/20   [provider]  iron polysaccharides (NIFEREX) 150 MG capsule Take 150 mg by mouth 2 (two) times daily.     [provider]  levothyroxine (SYNTHROID) 112 MCG tablet Take one tablet 5 days of the week and HOLD 2 days of the week. 01/27/20   [provider]  ONETOUCH VERIO test strip USE TO TEST UP TO 10 TIMES D 08/24/17   [provider]  pantoprazole (PROTONIX) 40 MG tablet Take 1 tablet (40 mg total) by mouth 2 (two) times daily. 11/27/19   Hosie Poisson, MD  vitamin C (ASCORBIC ACID) 500 MG tablet Take 500 mg  by mouth daily.    [provider]  Vitamin D, Ergocalciferol, (DRISDOL) 50000 UNITS CAPS Take 50,000 Units by mouth 2 (two) times a week. Sundays & Wednesdays. 02/20/12   [provider]  zinc sulfate 220 (50 Zn) MG capsule Take 1 capsule (220 mg total) by mouth daily. 11/28/19   Hosie Poisson, MD    Allergies    Erythromycin, Penicillins, Nyquil [[id number]-[id number]], and Sulfa antibiotics  Review of Systems   Review of Systems  Constitutional: Negative for fever.  HENT: Negative for congestion.   Eyes: Negative for photophobia and visual disturbance.  Respiratory: Negative for shortness of breath.   Cardiovascular: Negative for chest pain.  Gastrointestinal: Negative for abdominal pain.  Genitourinary: Negative for difficulty urinating.  Musculoskeletal: Negative for arthralgias.  Neurological: Negative for dizziness, seizures, facial asymmetry, speech difficulty, weakness, numbness and headaches.  All other systems reviewed and are negative.   Physical Exam Updated Vital Signs BP (!) 179/74   Pulse 78   Temp 98.2 F (36.8 C) (Oral)   Resp 15   SpO2 98%   Physical Exam Vitals and nursing note reviewed.  Constitutional:      General: She is not in acute distress.    Appearance: Normal appearance. She is not diaphoretic.    HENT:     Head: Normocephalic and atraumatic.     Nose: Nose normal.  Eyes:     Extraocular Movements: Extraocular movements intact.     Pupils: Pupils are equal, round, and reactive to light.  Cardiovascular:     Rate and Rhythm: Normal rate and regular rhythm.     Pulses: Normal pulses.     Heart sounds: Normal heart sounds.  Pulmonary:     Effort: Pulmonary effort is normal.     Breath sounds: Normal breath sounds.  Abdominal:     General: Abdomen is flat. Bowel sounds are normal.     Tenderness: There is no abdominal tenderness. There is no guarding.  Musculoskeletal:        General: Normal range of motion.     Cervical back: Normal range of motion and neck supple.  Skin:    General: Skin is warm and dry.     Capillary Refill: Capillary refill takes less than 2 seconds.  Neurological:     General: No focal deficit present.     Mental Status: She is alert and oriented to person, place, and time.     Deep Tendon Reflexes: Reflexes normal.  Psychiatric:        Mood and Affect: Mood normal.        Behavior: Behavior normal.     ED Results / Procedures / Treatments   Labs (all labs ordered are listed, but only abnormal results are displayed) Results for orders placed or performed during the hospital encounter of 03/18/20  CBC with Differential/Platelet  Result Value Ref Range   WBC 8.5 4.0 - 10.5 K/uL   RBC 3.54 (L) 3.87 - 5.11 MIL/uL   Hemoglobin 9.9 (L) 12.0 - 15.0 g/dL   HCT 32.2 (L) 36.0 - 46.0 %   MCV 91.0 80.0 - 100.0 fL   MCH 28.0 26.0 - 34.0 pg   MCHC 30.7 30.0 - 36.0 g/dL   RDW 16.1 (H) 11.5 - 15.5 %   Platelets 400 150 - 400 K/uL   nRBC 0.0 0.0 - 0.2 %   Neutrophils Relative % 58 %   Neutro Abs 4.9 1.7 - 7.7 K/uL   Lymphocytes Relative 24 %  Lymphs Abs 2.0 0.7 - 4.0 K/uL   Monocytes Relative 14 %   Monocytes Absolute 1.2 (H) 0.1 - 1.0 K/uL   Eosinophils Relative 3 %   Eosinophils Absolute 0.3 0.0 - 0.5 K/uL   Basophils Relative 1 %   Basophils  Absolute 0.0 0.0 - 0.1 K/uL   Immature Granulocytes 0 %   Abs Immature Granulocytes 0.03 0.00 - 0.07 K/uL  Urinalysis, Routine w reflex microscopic  Result Value Ref Range   Color, Urine STRAW (A) YELLOW   APPearance CLEAR CLEAR   Specific Gravity, Urine 1.004 (L) 1.005 - 1.030   pH 7.0 5.0 - 8.0   Glucose, UA 50 (A) NEGATIVE mg/dL   Hgb urine dipstick NEGATIVE NEGATIVE   Bilirubin Urine NEGATIVE NEGATIVE   Ketones, ur NEGATIVE NEGATIVE mg/dL   Protein, ur >=300 (A) NEGATIVE mg/dL   Nitrite NEGATIVE NEGATIVE   Leukocytes,Ua NEGATIVE NEGATIVE   RBC / HPF 0-5 0 - 5 RBC/hpf   WBC, UA 0-5 0 - 5 WBC/hpf   Bacteria, UA NONE SEEN NONE SEEN  I-stat chem 8, ED (not at [facility] or [facility])  Result Value Ref Range   Sodium 137 135 - 145 mmol/L   Potassium 4.3 3.5 - 5.1 mmol/L   Chloride 100 98 - 111 mmol/L   BUN 37 (H) 8 - 23 mg/dL   Creatinine, Ser 2.00 (H) 0.44 - 1.00 mg/dL   Glucose, Bld 123 (H) 70 - 99 mg/dL   Calcium, Ion 1.10 (L) 1.15 - 1.40 mmol/L   TCO2 31 22 - 32 mmol/L   Hemoglobin 9.9 (L) 12.0 - 15.0 g/dL   HCT 29.0 (L) 36.0 - 46.0 %   No results found.  EKG None  Radiology No results found.  Procedures Procedures (including critical care time)  Medications Ordered in ED Medications - No data to display  ED Course  I have reviewed the triage vital signs and the nursing notes.  Pertinent labs & imaging results that were available during my care of the patient were reviewed by me and considered in my medical decision making (see chart for details).    Patient has known renal insufficiency.  Will refer to renal.  Will refer back to cardiology for ongoing treatment of HTN.  Based on the [place] 7 criteria there is no indication for emergent lowering of asymptomatic HTN.  This is chronic and ongoing.    Elizabeth Olsen was evaluated in Emergency Department on 03/19/2020 for the symptoms described in the history of present illness. She was evaluated in the context of the global  COVID-19 pandemic, which necessitated consideration that the patient might be at risk for infection with the SARS-CoV-2 virus that causes COVID-19. Institutional protocols and algorithms that pertain to the evaluation of patients at risk for COVID-19 are in a state of rapid change based on information released by regulatory bodies including the CDC and federal and state organizations. These policies and algorithms were followed during the patient's care in the ED.  Final Clinical Impression(s) / ED Diagnoses Return for intractable cough, coughing up blood,fevers >100.4 unrelieved by medication, shortness of breath, intractable vomiting, chest pain, shortness of breath, weakness,numbness, changes in speech, facial asymmetry,abdominal pain, passing out,Inability to tolerate liquids or food, cough, altered mental status or any concerns. No signs of systemic illness or infection. The patient is nontoxic-appearing on exam and vital signs are within normal limits.   I have reviewed the triage vital signs and the nursing notes. Pertinent labs &imaging results  that were available during my care of the patient were reviewed by me and considered in my medical decision making (see chart for details).After history, exam, and medical workup I feel the patient has beenappropriately medically screened and is safe for discharge home. Pertinent diagnoses were discussed with the patient. Patient was given return precautions.   Wassim Kirksey, MD 03/19/20 [id number]

## 2020-03-19 NOTE — Telephone Encounter (Signed)
We can increase hydralazine to 25 mg 3 times daily that should help

## 2020-03-22 ENCOUNTER — Telehealth: Payer: Self-pay | Admitting: Emergency Medicine

## 2020-03-22 DIAGNOSIS — I10 Essential (primary) hypertension: Secondary | ICD-10-CM

## 2020-03-22 NOTE — Progress Notes (Signed)
[place]   [phone] [phone]   Clinic Follow up Note   Patient Care Team: [name], MD as PCP - General (Family Medicine) Park Liter, MD as PCP - Cardiology (Cardiology) Reynold Bowen, MD as Consulting Physician (Endocrinology) Bobbye Charleston, MD as Consulting Physician (Obstetrics and Gynecology)  Date of Service:  03/24/2020  CHIEF COMPLAINT: F/u anemia of chronic disease  CURRENT THERAPY:  Aranesp injection 133mgmonthly started on 01/01/18. Due to insurance was changed to Retacrit every 2 weeks starting 03/11/19.Dose increased to 40K on 12/31/2019. Increased to once weekly on 03/24/20.   INTERVAL HISTORY:  Elizabeth ROSNERis here for a follow up of anemia. She presents to the clinic alone. She notes she feels stable the week of injection. She does not feel much change. She was started on hydralazine after ED visit on 03/18/20 due to HTN and LE edema, R>L. She notes her right leg hurt with tough.  She notes her husband with stage IV Lung cancer is struggling worse lately. She and him have good family support. He is on Hospice. She was emotional in visit today.    REVIEW OF SYSTEMS:   Constitutional: Denies fevers, chills or abnormal weight loss Eyes: Denies blurriness of vision Ears, nose, mouth, throat, and face: Denies mucositis or sore throat Respiratory: Denies cough, dyspnea or wheezes Cardiovascular: Denies palpitation, chest discomfort (+) Improved lower extremity swelling, R>L Gastrointestinal:  Denies nausea, heartburn or change in bowel habits Skin: Denies abnormal skin rashes Lymphatics: Denies new lymphadenopathy or easy bruising Neurological:Denies numbness, tingling or new weaknesses Behavioral/Psych: Mood is stable, no new changes  All other systems were reviewed with the patient and are negative.  MEDICAL HISTORY:  Past Medical History:  Diagnosis Date  . Acquired hypothyroidism 10/13/2017  . Acute  diastolic CHF (congestive heart failure) ([facility] 11/20/2019  . Acute respiratory failure with hypoxia ([facility] 10/13/2017  . Acute systolic heart failure ([facility] 10/13/2017  . Anemia   . Anemia associated with diabetes mellitus ([facility] 10/13/2017  . Anemia of chronic disease 12/10/2017  . Blood transfusion   . Blood transfusion without reported diagnosis    with breast surgery  . Breast cancer ([place]   left   . Cardiomyopathy ([facility] 07/23/2017   Ejection fraction 45-50% may; from September 2018  . CHF (congestive heart failure) ([facility]   . 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 COVID-19 virus infection 11/20/2019  . Diabetes mellitus    dx 1998.  was told prior to getting chemo that her bld sugar rose.  She thought it would go back  . Dyspnea    d/t anemia  . Essential hypertension 09/11/2018  . GERD (gastroesophageal reflux disease)   . Heart murmur    as child  . Hernia, incisional   . Hyperlipidemia   . Hypertension   . Hypothyroidism   . Iron deficiency 07/23/2017  . Near syncope 04/01/2018  . Refractory anemia, unspecified ([place] 12/10/2017  . Type 1 diabetes ([facility] 07/23/2017  . Vertigo 06/24/2018  . Vitamin D deficiency     SURGICAL HISTORY: Past Surgical History:  Procedure Laterality Date  . ABDOMINAL HYSTERECTOMY    . APPENDECTOMY    . BREAST SURGERY    . CHOLECYSTECTOMY  01/22/2018   LAPROSCOPIC   . CHOLECYSTECTOMY N/A 01/22/2018   Procedure: LAPAROSCOPIC CHOLECYSTECTOMY WITH INTRAOPERATIVE CHOLANGIOGRAM;  Surgeon: TDonnie Mesa MD;  [facility]  Service: General;  Laterality:  N/A;  . COLONOSCOPY  2013   due next 03-2017  . EYE SURGERY     bil cataracts  . IUD REMOVAL     with appendectomy  . MASTECTOMY, MODIFIED RADICAL W/RECONSTRUCTION Left 15 years ago   10 nodes out  . TUMOR EXCISION Left    x 2, neck, head    I have reviewed the social history and family history with the patient and they are unchanged  from previous note.  ALLERGIES:  is allergic to erythromycin; penicillins; nyquil [pseudoeph-doxylamine-dm-apap]; and sulfa antibiotics.  MEDICATIONS:  Current Outpatient Medications  Medication Sig Dispense Refill  . amLODipine (NORVASC) 5 MG tablet Take 5 mg by mouth every morning.    . ASPIRIN ADULT LOW STRENGTH 81 MG EC tablet Take 81 mg by mouth every morning.    Marland Kitchen atorvastatin (LIPITOR) 10 MG tablet TAKE 1 TABLET BY MOUTH EVERY DAY ONE TIME ONLY (Patient taking differently: Take 10 mg by mouth daily. ) 90 tablet 2  . calcium carbonate (OSCAL) 1500 (600 Ca) MG TABS tablet Take 600 mg by mouth daily with breakfast.     . carvedilol (COREG) 6.25 MG tablet TAKE 1 TABLET BY MOUTH WITH BREAKFAST & EVENING MEAL EVERY DAY 180 tablet 1  . Continuous Blood Gluc Sensor (FREESTYLE LIBRE 14 DAY SENSOR) MISC See admin instructions.    . fenofibrate 160 MG tablet Take 160 mg by mouth daily.    . fish oil-omega-3 fatty acids 1000 MG capsule Take 1 g by mouth daily.     . furosemide (LASIX) 20 MG tablet Take 1 tablet (20 mg total) by mouth 3 (three) times daily. 90 tablet 2  . HUMALOG 100 UNIT/ML injection USE as directed via insulin pump. max total daily DOSE of 50 UNITS    . hydrALAZINE (APRESOLINE) 10 MG tablet Take 1 tablet (10 mg total) by mouth 3 (three) times daily. 270 tablet 1  . insulin glargine (LANTUS SOLOSTAR) 100 UNIT/ML Solostar Pen Inject 7 Units into the skin in the morning. And 6 units at night    . iron polysaccharides (NIFEREX) 150 MG capsule Take 150 mg by mouth 2 (two) times daily.     Marland Kitchen levothyroxine (SYNTHROID) 112 MCG tablet Take one tablet 5 days of the week and HOLD 2 days of the week.    Glory Rosebush VERIO test strip USE TO TEST UP TO 10 TIMES D  12  . pantoprazole (PROTONIX) 40 MG tablet Take 1 tablet (40 mg total) by mouth 2 (two) times daily. 60 tablet 1  . vitamin C (ASCORBIC ACID) 500 MG tablet Take 500 mg by mouth daily.    . Vitamin D, Ergocalciferol, (DRISDOL) 50000  UNITS CAPS Take 50,000 Units by mouth 2 (two) times a week. Sundays & Wednesdays.    Marland Kitchen zinc sulfate 220 (50 Zn) MG capsule Take 1 capsule (220 mg total) by mouth daily.     No current facility-administered medications for this visit.    PHYSICAL EXAMINATION: ECOG PERFORMANCE STATUS: 2 - Symptomatic, <50% confined to bed  Vitals:   03/24/20 0944  BP: (!) 170/74  Pulse: 82  Resp: 17  Temp: 97.6 F (36.4 C)  SpO2: 99%   Filed Weights   03/24/20 0944  Weight: 68.7 kg    Due to COVID19 we will limit examination to appearance. Patient had no complaints.  GENERAL:alert, no distress and comfortable SKIN: skin color normal, no rashes or significant lesions EYES: normal, Conjunctiva are pink and non-injected, sclera clear  NEURO: alert & oriented x 3 with fluent speech (+) B/l LE edema, mild and very tender.    LABORATORY DATA:  I have reviewed the data as listed CBC Latest Ref Rng & Units 03/19/2020 03/19/2020 03/17/2020  WBC 4.0 - 10.5 K/uL - 8.5 9.1  Hemoglobin 12.0 - 15.0 g/dL 9.9(L) 9.9(L) 10.9(L)  Hematocrit 36.0 - 46.0 % 29.0(L) 32.2(L) 34.3  Platelets 150 - 400 K/uL - 400 505(H)     CMP Latest Ref Rng & Units 03/19/2020 03/17/2020 02/10/2020  Glucose 70 - 99 mg/dL 123(H) 192(H) 161(H)  BUN 8 - 23 mg/dL 37(H) 38(H) 45(H)  Creatinine 0.44 - 1.00 mg/dL 2.00(H) 2.39(H) 2.00(H)  Sodium 135 - 145 mmol/L 137 142 140  Potassium 3.5 - 5.1 mmol/L 4.3 3.9 4.9  Chloride 98 - 111 mmol/L 100 100 101  CO2 20 - 29 mmol/L - 23 32  Calcium 8.7 - 10.3 mg/dL - 9.3 9.3  Total Protein 6.0 - 8.5 g/dL - 6.7 6.8  Total Bilirubin 0.0 - 1.2 mg/dL - 0.2 0.4  Alkaline Phos 48 - 121 IU/L - 90 71  AST 0 - 40 IU/L - 21 24  ALT 0 - 32 IU/L - 15 16      RADIOGRAPHIC STUDIES: I have personally reviewed the radiological images as listed and agreed with the findings in the report. No results found.   ASSESSMENT & PLAN:  Elizabeth Olsen is a 79 y.o. female with    1. Anemia of chronic diseases    -I previously reviewed her CBC and anemia workup, she had normal ferritin, iron study, V40 and folic acid in 9811, ret was low, normal MCV. No lab evidence for hemolysis or multiple myeloma. Her erythropoietin level was normal. -She does have chronic disease including uncontrolled diabetes, congestive heart failure, and mild CKD, which can cause anemia of chronic disease -Herbone marrow biopsy in 11/21/17 showedno evidence of MDS or other primary marrow disease. I think this is consistent with anemia of chronic disease.  -Since her Hgb is below 9 most of time, she started monthlyAranespinjectionin 12/2017, she is tolerating well.Due to insurance her Aranesp was changed to Retacrit every 2 weeks on 03/11/19 with goal Hg9-11.We discussedthe risk of thrombosis from above, especially when hemoglobin is above 11.She is tolerating well, and will continue -She does not feel much different after injections. CBC from last week show Hg 9.9. To better manage her symptoms I recommend increasing frequency of Retacrit to weekly. She is agreeable. Will proceed with injection today  -F/u in 4 weeks   2. CHFand hypertension, LE edema  -Continue to f/u withcardiologist, Dr. Agustin Cree regularly. She takes 40 mg Lasixand potassium, HTN not well controlled. -She has been having LE edema recently. She is on Lasix.  -She went to ED on 03/18/20 due to HTN. Since she was placed on Hydralazine.     3. DMwith hyperglycemia -DM poorly controlled. -She will continue to f/u with Dr. Forde Dandy for management -Her legs are very tender to touch on exam (03/24/20). I discussed this could be related DM related neuropathy.   4. Stage III CKD -Likely secondary to her diabetes.  -I previously advised her to drink plenty of fluids and to control her diabetes.  5.Hypothyroidism, Significant Fatigue  -She is on Levothyroxine. She is also on Vit D 50,000 units twice a week. This is managed by Dr. Forde Dandy -Her  07/01/19 TSH was low at 0.264, lower than normal. I suspect her significant fatigue is related to his  thyroid function. I recommend she see Dr. Forde Dandy for dose adjustment.   6. Cancer screenings  -2020 Mammogram and DEXA was normal -Her last colonoscopy in 07/2017 with Dr. Cristobal Goldmann 1 benign polyp removed, otherwise normal.   7. COVID19 (+)  -Per patient se was found to have COVID after 10/2019 hospitalization. She was SOB for a limited time but stayed in hospital longer due to kidney function at the time. She has recovered well.   8. Family Stress  -She notes her husband with stage IV Lung cancer is has worsened lately. He is on Hospice.  -She and him have good family support from their children but that do not live in town. Most of her family is in [place] -I offered her a chance to speak with our SW. She is agreeable.   PLAN: -Refer to SW for counseling -Labs reviewed proceed with Retacrit injection today. No need for Blood transfusion today  -Lab and Retacrit injection every week X4  -F/u in 4 weeks     No problem-specific Assessment & Plan notes found for this encounter.   No orders of the defined types were placed in this encounter.  All questions were answered. The patient knows to call the clinic with any problems, questions or concerns. No barriers to learning was detected. The total time spent in the appointment was 30 minutes.     Truitt Merle, MD 03/24/2020   I, Joslyn Devon, am acting as scribe for Truitt Merle, MD.   I have reviewed the above documentation for accuracy and completeness, and I agree with the above.

## 2020-03-22 NOTE — Telephone Encounter (Signed)
Called patient. Informed her of lab results advised her to have labs rechecked this Friday. She verbally understood no further questions.

## 2020-03-22 NOTE — Telephone Encounter (Signed)
-----   Message from Park Liter, MD sent at 03/19/2020  8:42 AM EDT ----- Her kidney function did deteriorated.  Her creatinine was 2 and now it is 2.39.  Please ask her to continue doing what she is doing and we can recheck her kidney function meaning Chem-7 in about a week to 10 days

## 2020-03-24 ENCOUNTER — Other Ambulatory Visit: Payer: Self-pay

## 2020-03-24 ENCOUNTER — Encounter: Payer: Self-pay | Admitting: Hematology

## 2020-03-24 ENCOUNTER — Other Ambulatory Visit: Payer: Medicare Other

## 2020-03-24 ENCOUNTER — Inpatient Hospital Stay: Payer: Medicare Other

## 2020-03-24 ENCOUNTER — Inpatient Hospital Stay (HOSPITAL_BASED_OUTPATIENT_CLINIC_OR_DEPARTMENT_OTHER): Payer: Medicare Other | Admitting: Hematology

## 2020-03-24 VITALS — BP 170/74 | HR 82 | Temp 97.6°F | Resp 17 | Ht 63.0 in | Wt 151.4 lb

## 2020-03-24 VITALS — BP 155/68

## 2020-03-24 DIAGNOSIS — D638 Anemia in other chronic diseases classified elsewhere: Secondary | ICD-10-CM | POA: Diagnosis not present

## 2020-03-24 DIAGNOSIS — E1165 Type 2 diabetes mellitus with hyperglycemia: Secondary | ICD-10-CM | POA: Diagnosis not present

## 2020-03-24 MED ORDER — EPOETIN ALFA-EPBX 40000 UNIT/ML IJ SOLN
40000.0000 [IU] | Freq: Once | INTRAMUSCULAR | Status: AC
  Administered 2020-03-24: 40000 [IU] via SUBCUTANEOUS

## 2020-03-24 MED ORDER — EPOETIN ALFA-EPBX 10000 UNIT/ML IJ SOLN
INTRAMUSCULAR | Status: AC
  Filled 2020-03-24: qty 2

## 2020-03-24 MED ORDER — EPOETIN ALFA-EPBX 40000 UNIT/ML IJ SOLN
INTRAMUSCULAR | Status: AC
  Filled 2020-03-24: qty 1

## 2020-03-24 NOTE — Patient Instructions (Signed)

## 2020-03-25 ENCOUNTER — Telehealth: Payer: Self-pay | Admitting: Hematology

## 2020-03-25 NOTE — Telephone Encounter (Signed)
Scheduled appt per 5/26 los.  Spoke with pt and they are aware of their appt date and time. 

## 2020-03-27 LAB — BASIC METABOLIC PANEL
BUN/Creatinine Ratio: 15 (ref 12–28)
BUN: 36 mg/dL — ABNORMAL HIGH (ref 8–27)
CO2: 27 mmol/L (ref 20–29)
Calcium: 9.5 mg/dL (ref 8.7–10.3)
Chloride: 97 mmol/L (ref 96–106)
Creatinine, Ser: 2.35 mg/dL — ABNORMAL HIGH (ref 0.57–1.00)
GFR calc Af Amer: 22 mL/min/{1.73_m2} — ABNORMAL LOW (ref >59)
GFR calc non Af Amer: 19 mL/min/{1.73_m2} — ABNORMAL LOW (ref >59)
Glucose: 385 mg/dL — ABNORMAL HIGH (ref 65–99)
Potassium: 5.1 mmol/L (ref 3.5–5.2)
Sodium: 138 mmol/L (ref 134–144)

## 2020-03-30 ENCOUNTER — Encounter: Payer: Self-pay | Admitting: *Deleted

## 2020-03-30 NOTE — Progress Notes (Signed)
[place] Work  Clinical Social Work received referral from medical oncology for counseling and caregiver support.  CSW contacted patient at home to offer support and assess for needs.  Patient stated she was struggling with providing care for her husband who is currently under hospice care, and managing her health needs.  Patient express the need for additional care in the home.  CSW and patient discussed services provided by hospice.  CSW encouraged patient to speak with her husbands hospice team to determine if any additional in home services are available through her husbands insurance.  CSW also encouraged patient to utilize counseling programs provided through hospice.  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  CSW emphasized the importance of self-care and counseling services.  Patient stated she planned to make contact with hospice and [facility] services.  CSW provided contact information and encouraged patient to call with questions or concerns.   Johnnye Lana, MSW, LCSW, OSW-C Clinical Social Worker [facility] [phone]

## 2020-03-31 ENCOUNTER — Inpatient Hospital Stay: Payer: Medicare Other

## 2020-03-31 ENCOUNTER — Inpatient Hospital Stay: Payer: Medicare Other | Attending: Hematology

## 2020-03-31 ENCOUNTER — Other Ambulatory Visit: Payer: Self-pay | Admitting: Medical

## 2020-03-31 ENCOUNTER — Other Ambulatory Visit: Payer: Self-pay

## 2020-03-31 ENCOUNTER — Telehealth: Payer: Self-pay | Admitting: Emergency Medicine

## 2020-03-31 VITALS — BP 152/72 | HR 78 | Temp 98.2°F | Resp 18

## 2020-03-31 DIAGNOSIS — Z794 Long term (current) use of insulin: Secondary | ICD-10-CM | POA: Insufficient documentation

## 2020-03-31 DIAGNOSIS — D638 Anemia in other chronic diseases classified elsewhere: Secondary | ICD-10-CM | POA: Diagnosis present

## 2020-03-31 DIAGNOSIS — I5031 Acute diastolic (congestive) heart failure: Secondary | ICD-10-CM

## 2020-03-31 DIAGNOSIS — E1165 Type 2 diabetes mellitus with hyperglycemia: Secondary | ICD-10-CM | POA: Diagnosis present

## 2020-03-31 DIAGNOSIS — I10 Essential (primary) hypertension: Secondary | ICD-10-CM

## 2020-03-31 LAB — CBC WITH DIFFERENTIAL (CANCER CENTER ONLY)
Abs Immature Granulocytes: 0.03 10*3/uL (ref 0.00–0.07)
Basophils Absolute: 0.1 10*3/uL (ref 0.0–0.1)
Basophils Relative: 1 %
Eosinophils Absolute: 0.3 10*3/uL (ref 0.0–0.5)
Eosinophils Relative: 3 %
HCT: 33.7 % — ABNORMAL LOW (ref 36.0–46.0)
Hemoglobin: 10.3 g/dL — ABNORMAL LOW (ref 12.0–15.0)
Immature Granulocytes: 0 %
Lymphocytes Relative: 16 %
Lymphs Abs: 1.5 10*3/uL (ref 0.7–4.0)
MCH: 27.3 pg (ref 26.0–34.0)
MCHC: 30.6 g/dL (ref 30.0–36.0)
MCV: 89.4 fL (ref 80.0–100.0)
Monocytes Absolute: 1.2 10*3/uL — ABNORMAL HIGH (ref 0.1–1.0)
Monocytes Relative: 13 %
Neutro Abs: 6.3 10*3/uL (ref 1.7–7.7)
Neutrophils Relative %: 67 %
Platelet Count: 471 10*3/uL — ABNORMAL HIGH (ref 150–400)
RBC: 3.77 MIL/uL — ABNORMAL LOW (ref 3.87–5.11)
RDW: 16.2 % — ABNORMAL HIGH (ref 11.5–15.5)
WBC Count: 9.4 10*3/uL (ref 4.0–10.5)
nRBC: 0 % (ref 0.0–0.2)

## 2020-03-31 MED ORDER — EPOETIN ALFA-EPBX 40000 UNIT/ML IJ SOLN
40000.0000 [IU] | Freq: Once | INTRAMUSCULAR | Status: AC
  Administered 2020-03-31: 40000 [IU] via SUBCUTANEOUS

## 2020-03-31 MED ORDER — EPOETIN ALFA-EPBX 40000 UNIT/ML IJ SOLN
INTRAMUSCULAR | Status: AC
  Filled 2020-03-31: qty 1

## 2020-03-31 NOTE — Patient Instructions (Signed)

## 2020-03-31 NOTE — Telephone Encounter (Signed)
-----   Message from Park Liter, MD sent at 03/30/2020  6:14 PM EDT ----- Kids knee function deteriorated but still not at the highest level recommended previously.  Recheck her kidney function in about 10 days

## 2020-03-31 NOTE — Telephone Encounter (Signed)
Called patient informed her of her lab results and that she needs repeat lab work in 10 days she verbally understood no further questions.

## 2020-04-07 ENCOUNTER — Inpatient Hospital Stay: Payer: Medicare Other

## 2020-04-07 ENCOUNTER — Other Ambulatory Visit: Payer: Self-pay

## 2020-04-07 VITALS — BP 150/67 | HR 75 | Temp 98.2°F | Resp 18

## 2020-04-07 DIAGNOSIS — E1165 Type 2 diabetes mellitus with hyperglycemia: Secondary | ICD-10-CM | POA: Diagnosis not present

## 2020-04-07 DIAGNOSIS — D638 Anemia in other chronic diseases classified elsewhere: Secondary | ICD-10-CM

## 2020-04-07 LAB — CBC WITH DIFFERENTIAL (CANCER CENTER ONLY)
Abs Immature Granulocytes: 0.02 10*3/uL (ref 0.00–0.07)
Basophils Absolute: 0 10*3/uL (ref 0.0–0.1)
Basophils Relative: 1 %
Eosinophils Absolute: 0.4 10*3/uL (ref 0.0–0.5)
Eosinophils Relative: 4 %
HCT: 35.2 % — ABNORMAL LOW (ref 36.0–46.0)
Hemoglobin: 10.9 g/dL — ABNORMAL LOW (ref 12.0–15.0)
Immature Granulocytes: 0 %
Lymphocytes Relative: 19 %
Lymphs Abs: 1.7 10*3/uL (ref 0.7–4.0)
MCH: 27.7 pg (ref 26.0–34.0)
MCHC: 31 g/dL (ref 30.0–36.0)
MCV: 89.3 fL (ref 80.0–100.0)
Monocytes Absolute: 1.1 10*3/uL — ABNORMAL HIGH (ref 0.1–1.0)
Monocytes Relative: 13 %
Neutro Abs: 5.5 10*3/uL (ref 1.7–7.7)
Neutrophils Relative %: 63 %
Platelet Count: 398 10*3/uL (ref 150–400)
RBC: 3.94 MIL/uL (ref 3.87–5.11)
RDW: 16.7 % — ABNORMAL HIGH (ref 11.5–15.5)
WBC Count: 8.6 10*3/uL (ref 4.0–10.5)
nRBC: 0 % (ref 0.0–0.2)

## 2020-04-07 MED ORDER — EPOETIN ALFA-EPBX 40000 UNIT/ML IJ SOLN
INTRAMUSCULAR | Status: AC
  Filled 2020-04-07: qty 1

## 2020-04-07 MED ORDER — EPOETIN ALFA-EPBX 40000 UNIT/ML IJ SOLN
40000.0000 [IU] | Freq: Once | INTRAMUSCULAR | Status: AC
  Administered 2020-04-07: 40000 [IU] via SUBCUTANEOUS

## 2020-04-07 NOTE — Patient Instructions (Signed)

## 2020-04-10 LAB — BASIC METABOLIC PANEL
BUN/Creatinine Ratio: 19 (ref 12–28)
BUN: 51 mg/dL — ABNORMAL HIGH (ref 8–27)
CO2: 28 mmol/L (ref 20–29)
Calcium: 9.1 mg/dL (ref 8.7–10.3)
Chloride: 97 mmol/L (ref 96–106)
Creatinine, Ser: 2.69 mg/dL — ABNORMAL HIGH (ref 0.57–1.00)
GFR calc Af Amer: 19 mL/min/{1.73_m2} — ABNORMAL LOW (ref >59)
GFR calc non Af Amer: 16 mL/min/{1.73_m2} — ABNORMAL LOW (ref >59)
Glucose: 295 mg/dL — ABNORMAL HIGH (ref 65–99)
Potassium: 4.4 mmol/L (ref 3.5–5.2)
Sodium: 139 mmol/L (ref 134–144)

## 2020-04-12 ENCOUNTER — Telehealth: Payer: Self-pay | Admitting: Hematology

## 2020-04-12 ENCOUNTER — Other Ambulatory Visit: Payer: Self-pay

## 2020-04-12 ENCOUNTER — Ambulatory Visit (INDEPENDENT_AMBULATORY_CARE_PROVIDER_SITE_OTHER): Payer: Medicare Other | Admitting: Cardiology

## 2020-04-12 ENCOUNTER — Encounter: Payer: Self-pay | Admitting: Cardiology

## 2020-04-12 VITALS — BP 156/72 | HR 80 | Ht 63.0 in | Wt 149.0 lb

## 2020-04-12 DIAGNOSIS — E782 Mixed hyperlipidemia: Secondary | ICD-10-CM

## 2020-04-12 DIAGNOSIS — I420 Dilated cardiomyopathy: Secondary | ICD-10-CM

## 2020-04-12 DIAGNOSIS — I5032 Chronic diastolic (congestive) heart failure: Secondary | ICD-10-CM

## 2020-04-12 DIAGNOSIS — E1039 Type 1 diabetes mellitus with other diabetic ophthalmic complication: Secondary | ICD-10-CM

## 2020-04-12 DIAGNOSIS — I509 Heart failure, unspecified: Secondary | ICD-10-CM

## 2020-04-12 HISTORY — DX: Heart failure, unspecified: I50.9

## 2020-04-12 MED ORDER — HYDRALAZINE HCL 25 MG PO TABS: 25.0000 mg | ORAL_TABLET | Freq: Three times a day (TID) | ORAL | 1 refills | Status: DC

## 2020-04-12 MED ORDER — FUROSEMIDE 20 MG PO TABS: 40.0000 mg | ORAL_TABLET | Freq: Every day | ORAL | 1 refills | Status: DC

## 2020-04-12 NOTE — Progress Notes (Signed)
Cardiology Office Note:    Date:  04/12/2020   ID:  Elizabeth Olsen, DOB [birth year], MRN [id number]  PCP:  [name], MD  Cardiologist:  [name], MD    Referring MD: [name], MD   Chief Complaint  Patient presents with  . Follow-up  Still have some swelling of lower legs  History of Present Illness:    Elizabeth Olsen is a 79 y.o. female with past medical history significant for cardiomyopathy however latest estimation of left ventricle ejection fraction done in January 2021 showed normalization of left ventricle ejection fraction.  She still does have congestive heart failure which is diastolic in nature.  Additional problems include dyslipidemia as well as diabetes mellitus which is poorly controlled.  She comes today to my office complaining of having swelling of lower extremities.  She has been taking 60 mg of furosemide every single day however her kidney function deteriorated and we were forced to cut down this medication.  Denies having any paroxysmal nocturnal dyspnea.  She have to get up many times through the night and go to the restroom.  She is taking care of her husband who is depressed and he is in palliative care only.  Therapy to get her for 59 years.  Past Medical History:  Diagnosis Date  . Acquired hypothyroidism 10/13/2017  . Acute diastolic CHF (congestive heart failure) ([place]) 11/20/2019  . Acute respiratory failure with hypoxia ([place]) 10/13/2017  . Acute systolic heart failure ([place]) 10/13/2017  . Anemia   . Anemia associated with diabetes mellitus ([place]) 10/13/2017  . Anemia of chronic disease 12/10/2017  . Blood transfusion   . Blood transfusion without reported diagnosis    with breast surgery  . Breast cancer ([place]) 15 years ago   left   . Cardiomyopathy ([place]) 07/23/2017   Ejection fraction 45-50% may; from September 2018  . CHF (congestive heart failure) ([place])   . 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 COVID-19 virus infection 11/20/2019  . Diabetes mellitus    dx 1998.  was told prior to getting chemo that her bld sugar rose.  She thought it would go back  . Dyspnea    d/t anemia  . Essential hypertension 09/11/2018  . GERD (gastroesophageal reflux disease)   . Heart murmur    as child  . Hernia, incisional   . Hyperlipidemia   . Hypertension   . Hypothyroidism   . Iron deficiency 07/23/2017  . Near syncope 04/01/2018  . Refractory anemia, unspecified ([place]) 12/10/2017  . Type 1 diabetes ([place]) 07/23/2017  . Vertigo 06/24/2018  . Vitamin D deficiency     Past Surgical History:  Procedure Laterality Date  . ABDOMINAL HYSTERECTOMY    . APPENDECTOMY    . BREAST SURGERY    . CHOLECYSTECTOMY  01/22/2018   LAPROSCOPIC   . CHOLECYSTECTOMY N/A 01/22/2018   Procedure: LAPAROSCOPIC CHOLECYSTECTOMY WITH INTRAOPERATIVE CHOLANGIOGRAM;  Surgeon: Donnie Mesa, MD;  Location: [place];  Service: General;  Laterality: N/A;  . COLONOSCOPY  2013   due next 03-2017  . EYE SURGERY     bil cataracts  . IUD REMOVAL     with appendectomy  . MASTECTOMY, MODIFIED RADICAL W/RECONSTRUCTION Left 15 years ago   10 nodes out  . TUMOR EXCISION Left    x 2, neck, head    Current Medications: Current Meds  Medication Sig  . amLODipine (NORVASC) 5 MG tablet Take 5 mg by  mouth every morning.  . ASPIRIN ADULT LOW STRENGTH 81 MG EC tablet Take 81 mg by mouth every morning.  Marland Kitchen atorvastatin (LIPITOR) 10 MG tablet TAKE 1 TABLET BY MOUTH EVERY DAY ONE TIME ONLY (Patient taking differently: Take 10 mg by mouth daily. )  . calcium carbonate (OSCAL) 1500 (600 Ca) MG TABS tablet Take 600 mg by mouth daily with breakfast.   . carvedilol (COREG) 6.25 MG tablet TAKE 1 TABLET BY MOUTH WITH BREAKFAST & EVENING MEAL EVERY DAY  . Continuous Blood Gluc Sensor (FREESTYLE LIBRE 14 DAY SENSOR) MISC See admin instructions.  . fenofibrate 160 MG tablet Take 160 mg by mouth daily.  . fish oil-omega-3 fatty acids 1000  MG capsule Take 1 g by mouth daily.   . furosemide (LASIX) 20 MG tablet Take 1 tablet (20 mg total) by mouth 3 (three) times daily.  Marland Kitchen HUMALOG 100 UNIT/ML injection USE as directed via insulin pump. max total daily DOSE of 50 UNITS  . hydrALAZINE (APRESOLINE) 10 MG tablet Take 1 tablet (10 mg total) by mouth 3 (three) times daily.  . insulin glargine (LANTUS SOLOSTAR) 100 UNIT/ML Solostar Pen Inject 7 Units into the skin in the morning. And 6 units at night  . iron polysaccharides (NIFEREX) 150 MG capsule Take 150 mg by mouth 2 (two) times daily.   Marland Kitchen levothyroxine (SYNTHROID) 112 MCG tablet Take one tablet 5 days of the week and HOLD 2 days of the week.  Glory Rosebush VERIO test strip USE TO TEST UP TO 10 TIMES D  . pantoprazole (PROTONIX) 40 MG tablet Take 1 tablet (40 mg total) by mouth 2 (two) times daily.  . vitamin C (ASCORBIC ACID) 500 MG tablet Take 500 mg by mouth daily.  . Vitamin D, Ergocalciferol, (DRISDOL) 50000 UNITS CAPS Take 50,000 Units by mouth 2 (two) times a week. Sundays & Wednesdays.  Marland Kitchen zinc sulfate 220 (50 Zn) MG capsule Take 1 capsule (220 mg total) by mouth daily.     Allergies:   Erythromycin, Penicillins, Nyquil [pseudoeph-doxylamine-dm-apap], and Sulfa antibiotics   Social History   Socioeconomic History  . Marital status: Married    Spouse name: Not on file  . Number of children: 3  . Years of education: Not on file  . Highest education level: Not on file  Occupational History  . Occupation: homemaker  Tobacco Use  . Smoking status: Never Smoker  . Smokeless tobacco: Never Used  Vaping Use  . Vaping Use: Never used  Substance and Sexual Activity  . Alcohol use: No  . Drug use: No  . Sexual activity: Not on file  Other Topics Concern  . Not on file  Social History Narrative  . Not on file   Social Determinants of Health   Financial Resource Strain:   . Difficulty of Paying Living Expenses:   Food Insecurity:   . Worried About Charity fundraiser in  the Last Year:   . Arboriculturist in the Last Year:   Transportation Needs:   . Film/video editor (Medical):   Marland Kitchen Lack of Transportation (Non-Medical):   Physical Activity:   . Days of Exercise per Week:   . Minutes of Exercise per Session:   Stress:   . Feeling of Stress :   Social Connections:   . Frequency of Communication with Friends and Family:   . Frequency of Social Gatherings with Friends and Family:   . Attends Religious Services:   . Active Member of  Clubs or Organizations:   . Attends Archivist Meetings:   Marland Kitchen Marital Status:      Family History: The patient's family history includes Congestive Heart Failure in her father; Diabetes in an other family member; Heart attack in her brother; Hypertension in her father, maternal grandfather, and maternal grandmother; Peripheral vascular disease in her father; Stomach cancer in her maternal grandmother. There is no history of Colon cancer, Esophageal cancer, Pancreatic cancer, or Rectal cancer. ROS:   Please see the history of present illness.    All 14 point review of systems negative except as described per history of present illness  EKGs/Labs/Other Studies Reviewed:      Recent Labs: 11/20/2019: B Natriuretic Peptide 484.5 11/23/2019: Magnesium 2.0 03/17/2020: ALT 15; NT-Pro BNP 3,968; TSH 1.640 04/07/2020: Hemoglobin 10.9; Platelet Count 398 04/09/2020: BUN 51; Creatinine, Ser 2.69; Potassium 4.4; Sodium 139  Recent Lipid Panel    Component Value Date/Time   CHOL 184 07/23/2017 1538   TRIG 91 07/23/2017 1538   HDL 77 07/23/2017 1538   CHOLHDL 2.4 07/23/2017 1538   LDLCALC 89 07/23/2017 1538    Physical Exam:    VS:  BP (!) 156/72   Pulse 80   Ht 5\' 3"  (1.6 m)   Wt 149 lb (67.6 kg)   SpO2 97%   BMI 26.39 kg/m     Wt Readings from Last 3 Encounters:  04/12/20 149 lb (67.6 kg)  03/24/20 151 lb 6.4 oz (68.7 kg)  03/17/20 152 lb (68.9 kg)     GEN:  Well nourished, well developed in no acute  distress HEENT: Normal NECK: No JVD; No carotid bruits LYMPHATICS: No lymphadenopathy CARDIAC: RRR, holosystolic murmur grade 1/6 best heard left border of the sternum, no rubs, no gallops RESPIRATORY:  Clear to auscultation without rales, wheezing or rhonchi  ABDOMEN: Soft, non-tender, non-distended MUSCULOSKELETAL:  No edema; No deformity  SKIN: Warm and dry LOWER EXTREMITIES: 1+ swelling NEUROLOGIC:  Alert and oriented x 3 PSYCHIATRIC:  Normal affect   ASSESSMENT:    1. Dilated cardiomyopathy ([place])   2. 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 Mixed hyperlipidemia   4. Chronic diastolic congestive heart failure ([place])    PLAN:    In order of problems listed above:  1. Dilated cardiomyopathy with congestive heart failure.  Last estimation of ejection fraction was normal.  She does have diastolic dysfunction.  She does have swelling of lower extremities.  I will ask her to discontinue amlodipine.  I also asked her to cut down her furosemide to only 40 mg daily.  Chem-7 will be repeated on Friday on Monday.  Based on that we will decide if he can continue present dose of diuretic.  Obviously kidney dysfunction is a big problem.  I will also increase the dose of hydralazine from 10 mg to 25 mg 3 times daily and hopefully by doing this I will improve her blood pressure control when she is taking no amlodipine. 2. Mixed dyslipidemia: She is on Lipitor 10 which I will continue.  In the future we will check her fasting lipid profile.  I did review her K PN which showed me her cholesterol from March of this year total only 161.  I do have LDL from last year being 64 and HDL 66. 3. Diabetes: Always poorly controlled lately she is making some progress but still much more room for improvement.   Medication Adjustments/Labs and Tests Ordered: Current medicines are reviewed  at length with the patient today.  Concerns regarding medicines are outlined above.  No orders  of the defined types were placed in this encounter.  Medication changes: No orders of the defined types were placed in this encounter.   Signed, Park Liter, MD, [facility] 04/12/2020 10:10 AM    [place]

## 2020-04-12 NOTE — Telephone Encounter (Signed)
Called pt back per 6/14 sch message - no answer. Left message for patient with call back for reschedule.

## 2020-04-12 NOTE — Addendum Note (Signed)
Addended by: Ashok Norris on: 04/12/2020 10:46 AM   Modules accepted: Orders

## 2020-04-12 NOTE — Patient Instructions (Signed)
Medication Instructions:  Your physician has recommended you make the following change in your medication:   INCREASE: Hydralazine 25 mg 3 times daily   DECREASE: Lasix 40 mg daily   STOP: Amlodipine   *If you need a refill on your cardiac medications before your next appointment, please call your pharmacy*   Lab Work: Your physician recommends that you return for lab work Friday: bmp   If you have labs (blood work) drawn today and your tests are completely normal, you will receive your results only by: Marland Kitchen MyChart Message (if you have MyChart) OR . A paper copy in the mail If you have any lab test that is abnormal or we need to change your treatment, we will call you to review the results.   Testing/Procedures: None.    Follow-Up: At [facility], you and your health needs are our priority.  As part of our continuing mission to provide you with exceptional heart care, we have created designated Provider Care Teams.  These Care Teams include your primary Cardiologist (physician) and Advanced Practice Providers (APPs -  Physician Assistants and Nurse Practitioners) who all work together to provide you with the care you need, when you need it.  We recommend signing up for the patient portal called "MyChart".  Sign up information is provided on this After Visit Summary.  MyChart is used to connect with patients for Virtual Visits (Telemedicine).  Patients are able to view lab/test results, encounter notes, upcoming appointments, etc.  Non-urgent messages can be sent to your provider as well.   To learn more about what you can do with MyChart, go to [email or web address].    Your next appointment:   1 month(s)  The format for your next appointment:   In Person  Provider:   [name], MD   Other Instructions

## 2020-04-14 ENCOUNTER — Inpatient Hospital Stay: Payer: Medicare Other

## 2020-04-14 ENCOUNTER — Other Ambulatory Visit: Payer: Self-pay

## 2020-04-14 ENCOUNTER — Other Ambulatory Visit: Payer: Medicare Other

## 2020-04-14 ENCOUNTER — Ambulatory Visit: Payer: Medicare Other

## 2020-04-14 DIAGNOSIS — E1165 Type 2 diabetes mellitus with hyperglycemia: Secondary | ICD-10-CM | POA: Diagnosis not present

## 2020-04-14 DIAGNOSIS — D638 Anemia in other chronic diseases classified elsewhere: Secondary | ICD-10-CM

## 2020-04-14 LAB — CBC WITH DIFFERENTIAL (CANCER CENTER ONLY)
Abs Immature Granulocytes: 0.02 10*3/uL (ref 0.00–0.07)
Basophils Absolute: 0.1 10*3/uL (ref 0.0–0.1)
Basophils Relative: 1 %
Eosinophils Absolute: 0.3 10*3/uL (ref 0.0–0.5)
Eosinophils Relative: 4 %
HCT: 35.5 % — ABNORMAL LOW (ref 36.0–46.0)
Hemoglobin: 11 g/dL — ABNORMAL LOW (ref 12.0–15.0)
Immature Granulocytes: 0 %
Lymphocytes Relative: 19 %
Lymphs Abs: 1.6 10*3/uL (ref 0.7–4.0)
MCH: 27.2 pg (ref 26.0–34.0)
MCHC: 31 g/dL (ref 30.0–36.0)
MCV: 87.9 fL (ref 80.0–100.0)
Monocytes Absolute: 1.1 10*3/uL — ABNORMAL HIGH (ref 0.1–1.0)
Monocytes Relative: 14 %
Neutro Abs: 5.1 10*3/uL (ref 1.7–7.7)
Neutrophils Relative %: 62 %
Platelet Count: 388 10*3/uL (ref 150–400)
RBC: 4.04 MIL/uL (ref 3.87–5.11)
RDW: 16.6 % — ABNORMAL HIGH (ref 11.5–15.5)
WBC Count: 8.2 10*3/uL (ref 4.0–10.5)
nRBC: 0 % (ref 0.0–0.2)

## 2020-04-14 NOTE — Progress Notes (Signed)
Per parameters, pt. Will not receive Retacrit injection today, Pt. Hgb=11.0. Pt informed & gave pt copy of labs.

## 2020-04-17 LAB — BASIC METABOLIC PANEL
BUN/Creatinine Ratio: 17 (ref 12–28)
BUN: 42 mg/dL — ABNORMAL HIGH (ref 8–27)
CO2: 25 mmol/L (ref 20–29)
Calcium: 9 mg/dL (ref 8.7–10.3)
Chloride: 98 mmol/L (ref 96–106)
Creatinine, Ser: 2.46 mg/dL — ABNORMAL HIGH (ref 0.57–1.00)
GFR calc Af Amer: 21 mL/min/{1.73_m2} — ABNORMAL LOW (ref >59)
GFR calc non Af Amer: 18 mL/min/{1.73_m2} — ABNORMAL LOW (ref >59)
Glucose: 65 mg/dL (ref 65–99)
Potassium: 5 mmol/L (ref 3.5–5.2)
Sodium: 140 mmol/L (ref 134–144)

## 2020-04-19 NOTE — Progress Notes (Signed)
[place] OFFICE PROGRESS NOTE  [name], MD [place] [address]  DIAGNOSIS: F/u anemia of chronic disease  CURRENT THERAPY: Aranesp injection 114mgmonthly started on 01/01/18. Due to insurance was changed to Retacrit every 2 weeks starting 03/11/19.Dose increased to 40K on 12/31/2019. Increased to once weekly on 03/24/20.   INTERVAL HISTORY: Elizabeth STENSLAND79y.o. female returns to the clinic for a follow up visit. The patient is feeling well today. She notes improvement in her energy. She states she used to have to rest in between activities but now is reporting increased activity tolerance. At her appointment last week, her CBC showed a hemoglobin of 11.0. She did not receive her injection at that appointment. She denies any abnormal bleeding. Denies chest pain, palpitations, or shortness of breath. Denies syncope. Denies lightheadedness. She reports that she is eating a lot of green veggies such as broccoli. [place]. She will need [place] She is here for repeat labs and a follow up visit.    MEDICAL HISTORY: Past Medical History:  Diagnosis Date  . Acquired hypothyroidism 10/13/2017  . Acute diastolic CHF (congestive heart failure) ([facility] 11/20/2019  . Acute respiratory failure with hypoxia ([place] [place] 10/13/2017  . Acute systolic heart failure ([facility] 10/13/2017  . Anemia   . Anemia associated with diabetes mellitus ([facility] 10/13/2017  . Anemia of chronic disease 12/10/2017  . Blood transfusion   . Blood transfusion without reported diagnosis    with breast surgery  . Breast cancer ([facility] 15 years ago   left   . Cardiomyopathy ([facility] 07/23/2017   Ejection fraction 45-50% may; from September 2018  . CHF (congestive heart failure) ([facility]   . 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 COVID-19  virus infection 11/20/2019  . Diabetes mellitus    dx 1998.  was told prior to getting chemo that her bld sugar rose.  She thought it would go back  . Dyspnea    d/t anemia  . Essential hypertension 09/11/2018  . GERD (gastroesophageal reflux disease)   . Heart murmur    as child  . Hernia, incisional   . Hyperlipidemia   . Hypertension   . Hypothyroidism   . Iron deficiency 07/23/2017  . Near syncope 04/01/2018  . Refractory anemia, unspecified ([facility] 12/10/2017  . Type 1 diabetes ([facility] [place] 07/23/2017  . Vertigo 06/24/2018  . Vitamin D deficiency     ALLERGIES:  is allergic to erythromycin, penicillins, nyquil [pseudoeph-doxylamine-dm-apap], and sulfa antibiotics.  MEDICATIONS:  Current Outpatient Medications  Medication Sig Dispense Refill  . ASPIRIN ADULT LOW STRENGTH 81 MG EC tablet Take 81 mg by mouth every morning.    .Marland Kitchenatorvastatin (LIPITOR) 10 MG tablet TAKE 1 TABLET BY MOUTH EVERY DAY ONE TIME ONLY (Patient taking differently: Take 10 mg by mouth daily. ) 90 tablet 2  . calcium carbonate (OSCAL) 1500 (600 Ca) MG TABS tablet Take 600 mg by mouth daily with breakfast.     . carvedilol (COREG) 6.25 MG tablet TAKE 1 TABLET BY MOUTH WITH BREAKFAST & EVENING MEAL EVERY DAY 180 tablet 1  . Continuous Blood Gluc Sensor (FREESTYLE LIBRE 14 DAY SENSOR) MISC See admin instructions.    . fenofibrate 160 MG tablet Take 160 mg by mouth daily.    . fish oil-omega-3 fatty acids 1000 MG  capsule Take 1 g by mouth daily.     . furosemide (LASIX) 20 MG tablet Take 2 tablets (40 mg total) by mouth daily. 60 tablet 1  . HUMALOG 100 UNIT/ML injection USE as directed via insulin pump. max total daily DOSE of 50 UNITS    . hydrALAZINE (APRESOLINE) 25 MG tablet Take 1 tablet (25 mg total) by mouth 3 (three) times daily. 90 tablet 1  . insulin glargine (LANTUS SOLOSTAR) 100 UNIT/ML Solostar Pen Inject 7 Units into the skin in the morning. And 6 units at night    . iron polysaccharides (NIFEREX) 150 MG capsule  Take 150 mg by mouth 2 (two) times daily.     Marland Kitchen levothyroxine (SYNTHROID) 112 MCG tablet Take one tablet 5 days of the week and HOLD 2 days of the week.    Glory Rosebush VERIO test strip USE TO TEST UP TO 10 TIMES D  12  . pantoprazole (PROTONIX) 40 MG tablet Take 1 tablet (40 mg total) by mouth 2 (two) times daily. 60 tablet 1  . vitamin C (ASCORBIC ACID) 500 MG tablet Take 500 mg by mouth daily.    . Vitamin D, Ergocalciferol, (DRISDOL) 50000 UNITS CAPS Take 50,000 Units by mouth 2 (two) times a week. Sundays & Wednesdays.    Marland Kitchen zinc sulfate 220 (50 Zn) MG capsule Take 1 capsule (220 mg total) by mouth daily.     No current facility-administered medications for this visit.    SURGICAL HISTORY:  Past Surgical History:  Procedure Laterality Date  . ABDOMINAL HYSTERECTOMY    . APPENDECTOMY    . BREAST SURGERY    . CHOLECYSTECTOMY  01/22/2018   LAPROSCOPIC   . CHOLECYSTECTOMY N/A 01/22/2018   Procedure: LAPAROSCOPIC CHOLECYSTECTOMY WITH INTRAOPERATIVE CHOLANGIOGRAM;  Surgeon: Donnie Mesa, MD;  Location: [facility];  Service: General;  Laterality: N/A;  . COLONOSCOPY  2013   due next 03-2017  . EYE SURGERY     bil cataracts  . IUD REMOVAL     with appendectomy  . MASTECTOMY, MODIFIED RADICAL W/RECONSTRUCTION Left 15 years ago   10 nodes out  . TUMOR EXCISION Left    x 2, neck, head    REVIEW OF SYSTEMS:   Review of Systems  Constitutional: Negative for appetite change, chills, fatigue, fever and unexpected weight change.  HENT: Negative for mouth sores, nosebleeds, sore throat and trouble swallowing.   Eyes: Negative for eye problems and icterus.  Respiratory: Negative for cough, hemoptysis, shortness of breath and wheezing.   Cardiovascular: Negative for chest pain and leg swelling.  Gastrointestinal: Negative for abdominal pain, constipation, diarrhea, nausea and vomiting.  Genitourinary: Negative for bladder incontinence, difficulty urinating, dysuria, frequency and hematuria.    Musculoskeletal: Negative for back pain, gait problem, neck pain and neck stiffness.  Skin: Negative for itching and rash.  Neurological: Negative for dizziness, extremity weakness, gait problem, headaches, light-headedness and seizures.  Hematological: Negative for adenopathy. Does not bruise/bleed easily.  Psychiatric/Behavioral: Negative for confusion, depression and sleep disturbance. The patient is not nervous/anxious.     PHYSICAL EXAMINATION:  Blood pressure (!) 150/80, pulse 80, temperature (!) 97.5 F (36.4 C), resp. rate 20, height '5\' 3"'  (1.6 m), weight 149 lb 8 oz (67.8 kg), SpO2 100 %.  ECOG PERFORMANCE STATUS: 1 - Symptomatic but completely ambulatory  Physical Exam  Constitutional: Oriented to person, place, and time and well-developed, well-nourished, and in no distress.  HENT:  Head: Normocephalic and atraumatic.  Mouth/Throat: Oropharynx is clear and  moist. No oropharyngeal exudate.  Eyes: Conjunctivae are normal. Right eye exhibits no discharge. Left eye exhibits no discharge. No scleral icterus.  Neck: Normal range of motion. Neck supple.  Cardiovascular: Normal rate, regular rhythm, normal heart sounds and intact distal pulses.   Pulmonary/Chest: Effort normal and breath sounds normal. No respiratory distress. No wheezes. No rales.  Abdominal: Soft. Bowel sounds are normal. Exhibits no distension and no mass. There is no tenderness.  Musculoskeletal: Normal range of motion. Exhibits no edema.  Lymphadenopathy:    No cervical adenopathy.  Neurological: Alert and oriented to person, place, and time. Exhibits normal muscle tone. Gait normal. Coordination normal.  Skin: Skin is warm and dry. No rash noted. Not diaphoretic. No erythema. No pallor.  Psychiatric: Mood, memory and judgment normal.  Vitals reviewed.  LABORATORY DATA: Lab Results  Component Value Date   WBC 7.1 04/21/2020   HGB 11.2 (L) 04/21/2020   HCT 37.0 04/21/2020   MCV 90.2 04/21/2020   PLT 327  04/21/2020      Chemistry      Component Value Date/Time   NA 140 04/16/2020 1156   K 5.0 04/16/2020 1156   CL 98 04/16/2020 1156   CO2 25 04/16/2020 1156   BUN 42 (H) 04/16/2020 1156   CREATININE 2.46 (H) 04/16/2020 1156   CREATININE 2.00 (H) 02/10/2020 0925      Component Value Date/Time   CALCIUM 9.0 04/16/2020 1156   ALKPHOS 90 03/17/2020 1134   AST 21 03/17/2020 1134   AST 24 02/10/2020 0925   ALT 15 03/17/2020 1134   ALT 16 02/10/2020 0925   BILITOT 0.2 03/17/2020 1134   BILITOT 0.4 02/10/2020 0925       RADIOGRAPHIC STUDIES:  No results found.   ASSESSMENT/PLAN:  Elizabeth Olsen is a 79 y.o. female with    1. Anemia of chronic diseases  -Dr. Burr Medico previously reviewed her CBC and anemia workup, she had normal ferritin, iron study, N36 and folic acid in 1443, ret was low, normal MCV. No lab evidence for hemolysis or multiple myeloma. Her erythropoietin level was normal. -She does have chronic disease including uncontrolled diabetes, congestive heart failure, and mild CKD, which can cause anemia of chronic disease -Herbone marrow biopsy in 11/21/17 showedno evidence of MDS or other primary marrow disease. Dr. Burr Medico thinks this is consistent with anemia of chronic disease.  -Since her Hgb is below 9 most of time, she started monthlyAranespinjectionin 12/2017, she was tolerating well.Due to insurance her Aranesp was changed to Retacrit every 2 weeks on 03/11/19 with goal Hg9-11.Dr. Burr Medico previously discussedthe risk of thrombosis from above, especially when hemoglobin is above 11.She is tolerating well, and will continue -She does not feel much different after injections. CBC from her visit with Dr. Burr Medico in May 2021, show Hg 9.9. To better manage her symptoms she recommend increasing frequency of Retacrit to weekly.  -Today, hemoglobin 11.2. The patient states she has improvement in her energy. She will not receive retacrit today.  -We will continue checking her  blood work weekly. She has an appointment next week on 6/30.  -F/U July 2021 before she moves. She is planning on moving to [place], [place] next month. I will place a referral to [facility] or [facility].   2. CHFand hypertension, LE edema -Continue to f/u withcardiologist, Dr. Agustin Cree regularly. She takes 40 mg Lasixand potassium, HTN not well controlled. -She has been having LE edema recently. She is on Lasix.  -She  went to ED on 03/18/20 due to HTN. Since she was placed on Hydralazine.     3. DMwith hyperglycemia -DM poorly controlled. -She will continue to f/u with Dr. Forde Dandy for management  4. Stage III CKD -Likely secondary to her diabetes.  -Dr. Burr Medico previously advised her to drink plenty of fluids and to control her diabetes.  5.Hypothyroidism, Significant Fatigue  -She is on Levothyroxine. She is also on Vit D 50,000 units twice a week. This is managed by Dr. Forde Dandy -Her 07/01/19 TSH was low at 0.264, lower than normal. Dr. Burr Medico suspected her significant fatigue is related to his thyroid function. Dr. Burr Medico recommend she see Dr. Forde Dandy for dose adjustment.   6. Cancer screenings  -2020 Mammogram and DEXA was normal -Her last colonoscopy in 07/2017 with Dr. Cristobal Goldmann 1 benign polyp removed, otherwise normal.  7. COVID19 (+)  -Per patient se was found to have COVID after 10/2019 hospitalization. She was SOB for a limited time but stayed in hospital longer due to kidney function at the time. She has recovered well.  8. Family Stress  -She notes her husband with stage IV Lung cancer is has worsened lately. He is on Hospice.  -She and him have good family support from their children but that do not live in town. Most of her family is in [place] -Dr. Burr Medico offered her a chance to speak with our SW. She is agreeable.  -The patient's husband is doing better and may consider revoking hospice in the future.   PLAN: -Labs  reviewed. No Retacrit injection today. -Lab and Retacrit injection every week X4 -F/u in 4 weeks or sooner if the patient will be moving before then.  -I have placed a referral to hematology/oncology in [place], [place]. Will reach out to HIM to ensure that medical records be transferred.     Orders Placed This Encounter  Procedures  . Ambulatory referral to Hematology / Oncology    Referral Priority:   Routine    Referral Type:   Consultation    Referral Reason:   Specialty Services Required    Requested Specialty:   Oncology    Number of Visits Requested:   [place], PA-C 04/21/20

## 2020-04-21 ENCOUNTER — Other Ambulatory Visit: Payer: Self-pay

## 2020-04-21 ENCOUNTER — Telehealth: Payer: Self-pay | Admitting: Physician Assistant

## 2020-04-21 ENCOUNTER — Inpatient Hospital Stay: Payer: Medicare Other

## 2020-04-21 ENCOUNTER — Inpatient Hospital Stay (HOSPITAL_BASED_OUTPATIENT_CLINIC_OR_DEPARTMENT_OTHER): Payer: Medicare Other | Admitting: Physician Assistant

## 2020-04-21 VITALS — BP 150/80 | HR 80 | Temp 97.5°F | Resp 20 | Ht 63.0 in | Wt 149.5 lb

## 2020-04-21 DIAGNOSIS — E1165 Type 2 diabetes mellitus with hyperglycemia: Secondary | ICD-10-CM | POA: Diagnosis not present

## 2020-04-21 DIAGNOSIS — D638 Anemia in other chronic diseases classified elsewhere: Secondary | ICD-10-CM

## 2020-04-21 LAB — CBC WITH DIFFERENTIAL (CANCER CENTER ONLY)
Abs Immature Granulocytes: 0.03 10*3/uL (ref 0.00–0.07)
Basophils Absolute: 0 10*3/uL (ref 0.0–0.1)
Basophils Relative: 1 %
Eosinophils Absolute: 0.3 10*3/uL (ref 0.0–0.5)
Eosinophils Relative: 4 %
HCT: 37 % (ref 36.0–46.0)
Hemoglobin: 11.2 g/dL — ABNORMAL LOW (ref 12.0–15.0)
Immature Granulocytes: 0 %
Lymphocytes Relative: 17 %
Lymphs Abs: 1.2 10*3/uL (ref 0.7–4.0)
MCH: 27.3 pg (ref 26.0–34.0)
MCHC: 30.3 g/dL (ref 30.0–36.0)
MCV: 90.2 fL (ref 80.0–100.0)
Monocytes Absolute: 0.8 10*3/uL (ref 0.1–1.0)
Monocytes Relative: 11 %
Neutro Abs: 4.8 10*3/uL (ref 1.7–7.7)
Neutrophils Relative %: 67 %
Platelet Count: 327 10*3/uL (ref 150–400)
RBC: 4.1 MIL/uL (ref 3.87–5.11)
RDW: 16.1 % — ABNORMAL HIGH (ref 11.5–15.5)
WBC Count: 7.1 10*3/uL (ref 4.0–10.5)
nRBC: 0 % (ref 0.0–0.2)

## 2020-04-21 NOTE — Telephone Encounter (Signed)
Tried to call the patient about her appointment today which was scheduled at 10:30. She did not show up for her appointment today and I was calling to see if she was still planning on coming. I was unable to reach her. I left a voicemail reminding her of her appointment for next week as well which is scheduled for 6/30. I will add on a provider visit for that day if she does not show up for her appointment this week and does not call back to reschedule.

## 2020-04-27 ENCOUNTER — Telehealth: Payer: Self-pay | Admitting: Medical Oncology

## 2020-04-27 ENCOUNTER — Telehealth: Payer: Self-pay | Admitting: Physician Assistant

## 2020-04-27 ENCOUNTER — Telehealth: Payer: Self-pay

## 2020-04-27 NOTE — Telephone Encounter (Signed)
Scheduled per los. Called, not able to leave msg. Mailed printout  

## 2020-04-27 NOTE — Telephone Encounter (Signed)
Spoke with patient regarding results and recommendation.  Patient verbalizes understanding and is agreeable to plan of care. Advised patient to call back with any issues or concerns.  

## 2020-04-27 NOTE — Telephone Encounter (Signed)
Records faxed to Dr Bennie Dallas. Pt notified.  Phone number and address given to wife.

## 2020-04-27 NOTE — Telephone Encounter (Signed)
-----   Message from Park Liter, MD sent at 04/14/2020 10:06 AM EDT ----- Please make sure that she takes only 20 mg of furosemide twice daily

## 2020-04-28 ENCOUNTER — Other Ambulatory Visit: Payer: Self-pay

## 2020-04-28 ENCOUNTER — Inpatient Hospital Stay: Payer: Medicare Other

## 2020-04-28 VITALS — BP 142/60 | HR 82 | Temp 98.2°F | Resp 18

## 2020-04-28 DIAGNOSIS — D638 Anemia in other chronic diseases classified elsewhere: Secondary | ICD-10-CM

## 2020-04-28 DIAGNOSIS — E1165 Type 2 diabetes mellitus with hyperglycemia: Secondary | ICD-10-CM | POA: Diagnosis not present

## 2020-04-28 LAB — CBC WITH DIFFERENTIAL (CANCER CENTER ONLY)
Abs Immature Granulocytes: 0.03 10*3/uL (ref 0.00–0.07)
Basophils Absolute: 0 10*3/uL (ref 0.0–0.1)
Basophils Relative: 1 %
Eosinophils Absolute: 0.3 10*3/uL (ref 0.0–0.5)
Eosinophils Relative: 4 %
HCT: 35.3 % — ABNORMAL LOW (ref 36.0–46.0)
Hemoglobin: 10.7 g/dL — ABNORMAL LOW (ref 12.0–15.0)
Immature Granulocytes: 0 %
Lymphocytes Relative: 21 %
Lymphs Abs: 1.7 10*3/uL (ref 0.7–4.0)
MCH: 26.9 pg (ref 26.0–34.0)
MCHC: 30.3 g/dL (ref 30.0–36.0)
MCV: 88.7 fL (ref 80.0–100.0)
Monocytes Absolute: 1.1 10*3/uL — ABNORMAL HIGH (ref 0.1–1.0)
Monocytes Relative: 14 %
Neutro Abs: 4.8 10*3/uL (ref 1.7–7.7)
Neutrophils Relative %: 60 %
Platelet Count: 326 10*3/uL (ref 150–400)
RBC: 3.98 MIL/uL (ref 3.87–5.11)
RDW: 15.5 % (ref 11.5–15.5)
WBC Count: 8 10*3/uL (ref 4.0–10.5)
nRBC: 0 % (ref 0.0–0.2)

## 2020-04-28 MED ORDER — EPOETIN ALFA-EPBX 40000 UNIT/ML IJ SOLN
40000.0000 [IU] | Freq: Once | INTRAMUSCULAR | Status: AC
  Administered 2020-04-28: 40000 [IU] via SUBCUTANEOUS

## 2020-04-28 MED ORDER — EPOETIN ALFA-EPBX 40000 UNIT/ML IJ SOLN
INTRAMUSCULAR | Status: AC
  Filled 2020-04-28: qty 1

## 2020-04-28 NOTE — Patient Instructions (Signed)

## 2020-05-05 ENCOUNTER — Inpatient Hospital Stay: Payer: Medicare Other | Attending: Hematology

## 2020-05-05 ENCOUNTER — Other Ambulatory Visit: Payer: Self-pay

## 2020-05-05 ENCOUNTER — Inpatient Hospital Stay: Payer: Medicare Other

## 2020-05-05 VITALS — BP 148/64

## 2020-05-05 DIAGNOSIS — D638 Anemia in other chronic diseases classified elsewhere: Secondary | ICD-10-CM

## 2020-05-05 DIAGNOSIS — E1165 Type 2 diabetes mellitus with hyperglycemia: Secondary | ICD-10-CM | POA: Insufficient documentation

## 2020-05-05 LAB — CBC WITH DIFFERENTIAL (CANCER CENTER ONLY)
Abs Immature Granulocytes: 0.04 10*3/uL (ref 0.00–0.07)
Basophils Absolute: 0 10*3/uL (ref 0.0–0.1)
Basophils Relative: 1 %
Eosinophils Absolute: 0.3 10*3/uL (ref 0.0–0.5)
Eosinophils Relative: 3 %
HCT: 34.2 % — ABNORMAL LOW (ref 36.0–46.0)
Hemoglobin: 10.6 g/dL — ABNORMAL LOW (ref 12.0–15.0)
Immature Granulocytes: 1 %
Lymphocytes Relative: 15 %
Lymphs Abs: 1.3 10*3/uL (ref 0.7–4.0)
MCH: 27.2 pg (ref 26.0–34.0)
MCHC: 31 g/dL (ref 30.0–36.0)
MCV: 87.9 fL (ref 80.0–100.0)
Monocytes Absolute: 1.3 10*3/uL — ABNORMAL HIGH (ref 0.1–1.0)
Monocytes Relative: 15 %
Neutro Abs: 5.8 10*3/uL (ref 1.7–7.7)
Neutrophils Relative %: 65 %
Platelet Count: 377 10*3/uL (ref 150–400)
RBC: 3.89 MIL/uL (ref 3.87–5.11)
RDW: 16 % — ABNORMAL HIGH (ref 11.5–15.5)
WBC Count: 8.7 10*3/uL (ref 4.0–10.5)
nRBC: 0 % (ref 0.0–0.2)

## 2020-05-05 MED ORDER — EPOETIN ALFA-EPBX 40000 UNIT/ML IJ SOLN
40000.0000 [IU] | Freq: Once | INTRAMUSCULAR | Status: AC
  Administered 2020-05-05: 40000 [IU] via SUBCUTANEOUS

## 2020-05-05 MED ORDER — EPOETIN ALFA-EPBX 40000 UNIT/ML IJ SOLN
INTRAMUSCULAR | Status: AC
  Filled 2020-05-05: qty 1

## 2020-05-05 NOTE — Patient Instructions (Signed)

## 2020-05-12 ENCOUNTER — Other Ambulatory Visit: Payer: Self-pay

## 2020-05-12 ENCOUNTER — Inpatient Hospital Stay: Payer: Medicare Other

## 2020-05-12 VITALS — BP 132/67 | HR 75

## 2020-05-12 DIAGNOSIS — D638 Anemia in other chronic diseases classified elsewhere: Secondary | ICD-10-CM

## 2020-05-12 DIAGNOSIS — E1165 Type 2 diabetes mellitus with hyperglycemia: Secondary | ICD-10-CM | POA: Diagnosis not present

## 2020-05-12 LAB — CBC WITH DIFFERENTIAL (CANCER CENTER ONLY)
Abs Immature Granulocytes: 0.05 10*3/uL (ref 0.00–0.07)
Basophils Absolute: 0 10*3/uL (ref 0.0–0.1)
Basophils Relative: 1 %
Eosinophils Absolute: 0.3 10*3/uL (ref 0.0–0.5)
Eosinophils Relative: 4 %
HCT: 34.6 % — ABNORMAL LOW (ref 36.0–46.0)
Hemoglobin: 10.5 g/dL — ABNORMAL LOW (ref 12.0–15.0)
Immature Granulocytes: 1 %
Lymphocytes Relative: 18 %
Lymphs Abs: 1.5 10*3/uL (ref 0.7–4.0)
MCH: 26.2 pg (ref 26.0–34.0)
MCHC: 30.3 g/dL (ref 30.0–36.0)
MCV: 86.3 fL (ref 80.0–100.0)
Monocytes Absolute: 1.2 10*3/uL — ABNORMAL HIGH (ref 0.1–1.0)
Monocytes Relative: 14 %
Neutro Abs: 5.2 10*3/uL (ref 1.7–7.7)
Neutrophils Relative %: 62 %
Platelet Count: 426 10*3/uL — ABNORMAL HIGH (ref 150–400)
RBC: 4.01 MIL/uL (ref 3.87–5.11)
RDW: 16.1 % — ABNORMAL HIGH (ref 11.5–15.5)
WBC Count: 8.3 10*3/uL (ref 4.0–10.5)
nRBC: 0 % (ref 0.0–0.2)

## 2020-05-12 MED ORDER — EPOETIN ALFA-EPBX 40000 UNIT/ML IJ SOLN
INTRAMUSCULAR | Status: AC
  Filled 2020-05-12: qty 1

## 2020-05-12 MED ORDER — EPOETIN ALFA-EPBX 40000 UNIT/ML IJ SOLN
40000.0000 [IU] | Freq: Once | INTRAMUSCULAR | Status: AC
  Administered 2020-05-12: 40000 [IU] via SUBCUTANEOUS

## 2020-05-12 NOTE — Patient Instructions (Addendum)

## 2020-05-14 NOTE — Progress Notes (Signed)
[name]   [phone] Fax:(336) [phone]   Clinic Follow up Note   Patient Care Team: [name], MD as PCP - General (Family Medicine) Park Liter, MD as PCP - Cardiology (Cardiology) Reynold Bowen, MD as Consulting Physician (Endocrinology) Bobbye Charleston, MD as Consulting Physician (Obstetrics and Gynecology)  Date of Service:  05/19/2020  CHIEF COMPLAINT: F/u anemia of chronic disease   CURRENT THERAPY:  Aranesp injection 175mgmonthly started on 01/01/18. Due to insurance was changed to Retacrit every 2 weeks starting 03/11/19.Dose increased to 40K on 12/31/2019. Increased to once weekly on 03/24/20.   INTERVAL HISTORY:  Elizabeth BUHRMANis here for a follow up of anemia. She presents to the clinic alone. She notes she is doing well. She notes she has the insulin pump and sensor now. She changes her pump every 3 days. She notes her energy level makes her tired easily with house work. She notes since increase in her Retacrit she is overall stable. She continues to take care of her husband at home who is on Hospice care. [place]. She plans to move in September [place]. She was already told by her husband's oncologist [place]    REVIEW OF SYSTEMS:   Constitutional: Denies fevers, chills or abnormal weight loss Eyes: Denies blurriness of vision Ears, nose, mouth, throat, and face: Denies mucositis or sore throat Respiratory: Denies cough, dyspnea or wheezes Cardiovascular: Denies palpitation, chest discomfort or lower extremity swelling Gastrointestinal:  Denies nausea, heartburn or change in bowel habits Skin: Denies abnormal skin rashes Lymphatics: Denies new lymphadenopathy or easy bruising Neurological:Denies numbness, tingling or new weaknesses Behavioral/Psych: Mood is stable, no new changes  All other systems were reviewed with the patient and are  negative.  MEDICAL HISTORY:  Past Medical History:  Diagnosis Date  . Acquired hypothyroidism 10/13/2017  . Acute diastolic CHF (congestive heart failure) ([facility] 11/20/2019  . Acute respiratory failure with hypoxia ([facility] 10/13/2017  . Acute systolic heart failure ([facility] 10/13/2017  . Anemia   . Anemia associated with diabetes mellitus ([facility] 10/13/2017  . Anemia of chronic disease 12/10/2017  . Blood transfusion   . Blood transfusion without reported diagnosis    with breast surgery  . Breast cancer ([facility] 15 years ago   left   . Cardiomyopathy ([facility] 07/23/2017   Ejection fraction 45-50% may; from September 2018  . CHF (congestive heart failure) ([facility]   . 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 COVID-19 virus infection 11/20/2019  . Diabetes mellitus    dx 1998.  was told prior to getting chemo that her bld sugar rose.  She thought it would go back  . Dyspnea    d/t anemia  . Essential hypertension 09/11/2018  . GERD (gastroesophageal reflux disease)   . Heart murmur    as child  . Hernia, incisional   . Hyperlipidemia   . Hypertension   . Hypothyroidism   . Iron deficiency 07/23/2017  . Near syncope 04/01/2018  . Refractory anemia, unspecified ([facility] 12/10/2017  . Type 1 diabetes ([facility] 07/23/2017  . Vertigo 06/24/2018  . Vitamin D deficiency     SURGICAL HISTORY: Past Surgical History:  Procedure Laterality Date  . ABDOMINAL HYSTERECTOMY    . APPENDECTOMY    . BREAST SURGERY    . CHOLECYSTECTOMY  01/22/2018   LAPROSCOPIC   . CHOLECYSTECTOMY N/A 01/22/2018  Procedure: LAPAROSCOPIC CHOLECYSTECTOMY WITH INTRAOPERATIVE CHOLANGIOGRAM;  Surgeon: Donnie Mesa, MD;  Location: [place];  Service: General;  Laterality: N/A;  . COLONOSCOPY  2013   due next 03-2017  . EYE SURGERY     bil cataracts  . IUD REMOVAL     with appendectomy  . MASTECTOMY, MODIFIED RADICAL W/RECONSTRUCTION Left 15 years ago   10 nodes out  . TUMOR EXCISION Left     x 2, neck, head    I have reviewed the social history and family history with the patient and they are unchanged from previous note.  ALLERGIES:  is allergic to erythromycin, penicillins, nyquil [pseudoeph-doxylamine-dm-apap], and sulfa antibiotics.  MEDICATIONS:  Current Outpatient Medications  Medication Sig Dispense Refill  . amLODipine (NORVASC) 5 MG tablet Take 5 mg by mouth every morning.    . ASPIRIN ADULT LOW STRENGTH 81 MG EC tablet Take 81 mg by mouth every morning.    . calcium carbonate (OSCAL) 1500 (600 Ca) MG TABS tablet Take 600 mg by mouth daily with breakfast.     . Continuous Blood Gluc Sensor (FREESTYLE LIBRE 14 DAY SENSOR) MISC See admin instructions.    . fenofibrate 160 MG tablet Take 160 mg by mouth daily.    . fish oil-omega-3 fatty acids 1000 MG capsule Take 1 g by mouth daily.     . furosemide (LASIX) 40 MG tablet Take by mouth 2 (two) times daily.    . iron polysaccharides (NIFEREX) 150 MG capsule Take 150 mg by mouth 2 (two) times daily.     Marland Kitchen levothyroxine (SYNTHROID) 112 MCG tablet Take one tablet 5 days of the week and HOLD 2 days of the week.    . pantoprazole (PROTONIX) 40 MG tablet Take 1 tablet (40 mg total) by mouth 2 (two) times daily. 60 tablet 1  . vitamin C (ASCORBIC ACID) 500 MG tablet Take 500 mg by mouth daily.    . Vitamin D, Ergocalciferol, (DRISDOL) 50000 UNITS CAPS Take 50,000 Units by mouth 2 (two) times a week. Sundays & Wednesdays.    Marland Kitchen zinc sulfate 220 (50 Zn) MG capsule Take 1 capsule (220 mg total) by mouth daily.    Marland Kitchen atorvastatin (LIPITOR) 10 MG tablet TAKE 1 TABLET BY MOUTH EVERY DAY ONE TIME ONLY 90 tablet 2  . carvedilol (COREG) 6.25 MG tablet TAKE 1 TABLET BY MOUTH WITH BREAKFAST & EVENING MEAL EVERY DAY 180 tablet 1  . HUMALOG 100 UNIT/ML injection USE as directed via insulin pump. max total daily DOSE of 50 UNITS    . hydrALAZINE (APRESOLINE) 25 MG tablet Take 1 tablet (25 mg total) by mouth 3 (three) times daily. 90 tablet 1   . Insulin Disposable Pump (OMNIPOD DASH 5 PACK PODS) MISC SMARTSIG:SUB-Q Every 3 Days    . insulin glargine (LANTUS SOLOSTAR) 100 UNIT/ML Solostar Pen Inject 7 Units into the skin in the morning. And 6 units at night    . ONETOUCH VERIO test strip USE TO TEST UP TO 10 TIMES D  12   No current facility-administered medications for this visit.    PHYSICAL EXAMINATION: ECOG PERFORMANCE STATUS: 1 - Symptomatic but completely ambulatory  Vitals:   05/19/20 0845  BP: (!) 156/69  Pulse: 78  Resp: 20  Temp: 97.9 F (36.6 C)  SpO2: 100%   Filed Weights   05/19/20 0845  Weight: 157 lb 1.6 oz (71.3 kg)    Due to COVID19 we will limit examination to appearance. Patient had no complaints.  GENERAL:alert, no distress and comfortable SKIN: skin color normal, no rashes or significant lesions EYES: normal, Conjunctiva are pink and non-injected, sclera clear  NEURO: alert & oriented x 3 with fluent speech   LABORATORY DATA:  I have reviewed the data as listed CBC Latest Ref Rng & Units 05/19/2020 05/12/2020 05/05/2020  WBC 4.0 - 10.5 K/uL 7.0 8.3 8.7  Hemoglobin 12.0 - 15.0 g/dL 10.7(L) 10.5(L) 10.6(L)  Hematocrit 36 - 46 % 34.8(L) 34.6(L) 34.2(L)  Platelets 150 - 400 K/uL 391 426(H) 377     CMP Latest Ref Rng & Units 04/16/2020 04/09/2020 03/26/2020  Glucose 65 - 99 mg/dL 65 295(H) 385(H)  BUN 8 - 27 mg/dL 42(H) 51(H) 36(H)  Creatinine 0.57 - 1.00 mg/dL 2.46(H) 2.69(H) 2.35(H)  Sodium 134 - 144 mmol/L 140 139 138  Potassium 3.5 - 5.2 mmol/L 5.0 4.4 5.1  Chloride 96 - 106 mmol/L 98 97 97  CO2 20 - 29 mmol/L '25 28 27  ' Calcium 8.7 - 10.3 mg/dL 9.0 9.1 9.5  Total Protein 6.0 - 8.5 g/dL - - -  Total Bilirubin 0.0 - 1.2 mg/dL - - -  Alkaline Phos 48 - 121 IU/L - - -  AST 0 - 40 IU/L - - -  ALT 0 - 32 IU/L - - -      RADIOGRAPHIC STUDIES: I have personally reviewed the radiological images as listed and agreed with the findings in the report. No results found.   ASSESSMENT & PLAN:   Elizabeth Olsen is a 79 y.o. female with    1. Anemia of chronic diseases  -I previously reviewed her CBC and anemia workup, she had normal ferritin, iron study, W58 and folic acid in 0998, ret was low, normal MCV. No lab evidence for hemolysis or multiple myeloma. Her erythropoietin level was normal. -She does have chronic disease including uncontrolled diabetes, congestive heart failure, and mild CKD, which can cause anemia of chronic disease -Herbone marrow biopsy in 11/21/17 showedno evidence of MDS or other primary marrow disease. I think this is consistent with anemia of chronic disease.  -Since her Hgb is below 9 most of time, she started monthlyAranespinjectionin 12/2017, she is tolerating well.Due to insurance her Aranesp was changed to Retacrit every 2 weeks on 03/11/19 with goal Hg9-11. Dose and frequency was increased to Retacrit 40K weekly on 03/24/20.  -We previously discussedthe risk of thrombosis from above, especially when hemoglobin is above 11.She is tolerating well, and will continue  -we also discussed slightly increased risk of cancer with EPO, I encouraged her to continue routine cancer screening. -Labs reviewed, Hg improved to 10.7. Continue current weekly Retacrit today.  -F/u in 8 weeks   2. CHFand hypertension, LE edema -Continue to f/u withcardiologist, Dr. Agustin Cree regularly. She takes 40 mg Lasixand potassium, HTN not well controlled. -She has been having LE edema recently. She is on Lasix.  -She went to ED on 03/18/20 due to HTN. Since she was placed on Hydralazine.     3. DMwith hyperglycemia -DM poorly controlled. -She will continue to f/u with Dr. Forde Dandy for management  4. Stage III CKD -Likely secondary to her diabetes.  -I previously advised her to drink plenty of fluids and to control her diabetes.  5.Hypothyroidism, Significant Fatigue  -She is on Levothyroxine. She is also on Vit D 50,000 units twice a week. This is managed by  Dr. Forde Dandy -Her 07/01/19 TSH was low at 0.264, lower than normal. I suspect her significant fatigue is related to  his thyroid function. I recommend she see Dr. Forde Dandy for dose adjustment.   6. Cancer screenings  -2020 Mammogram and DEXA was normal -Her last colonoscopy in 07/2017 with Dr. Cristobal Goldmann 1 benign polyp removed, otherwise normal.  7. COVID19 (+)  -Per patient se was found to have COVID after 10/2019 hospitalization. She was SOB for a limited time but stayed in hospital longer due to kidney function at the time. She has recovered well.  8. Family Stress  -She notes her husband with stage IV Lung cancer is has worsened lately. He is on Hospice.  -She and him have good family support from their children but that do not live in town. Most of her family is in [place] -I offered her a chance to speak with our SW. She is agreeable.  -Her daughter lives in [place], [place]. She plans to move to [place], [place] near her along with her husband.   PLAN: -Labs reviewed proceed with Retacrit injection today.No need for Blood transfusion today  -Lab and Retacrit injection every week X8 -F/u in 8 weeks, which will be her last visit here  -She plans to move to [place], [place] week of 07/19/20. Will have HIM more her records to [facility] there.    No problem-specific Assessment & Plan notes found for this encounter.   No orders of the defined types were placed in this encounter.  All questions were answered. The patient knows to call the clinic with any problems, questions or concerns. No barriers to learning was detected. The total time spent in the appointment was 25 minutes.     Truitt Merle, MD 05/19/2020   I, Joslyn Devon, am acting as scribe for Truitt Merle, MD.   I have reviewed the above documentation for accuracy and completeness, and I agree with the above.

## 2020-05-19 ENCOUNTER — Inpatient Hospital Stay: Payer: Medicare Other

## 2020-05-19 ENCOUNTER — Other Ambulatory Visit: Payer: Self-pay

## 2020-05-19 ENCOUNTER — Inpatient Hospital Stay (HOSPITAL_BASED_OUTPATIENT_CLINIC_OR_DEPARTMENT_OTHER): Payer: Medicare Other | Admitting: Hematology

## 2020-05-19 ENCOUNTER — Other Ambulatory Visit: Payer: Self-pay | Admitting: Cardiology

## 2020-05-19 ENCOUNTER — Encounter: Payer: Self-pay | Admitting: Hematology

## 2020-05-19 ENCOUNTER — Telehealth: Payer: Self-pay | Admitting: Hematology

## 2020-05-19 VITALS — BP 156/69 | HR 78 | Temp 97.9°F | Resp 20 | Wt 157.1 lb

## 2020-05-19 DIAGNOSIS — D638 Anemia in other chronic diseases classified elsewhere: Secondary | ICD-10-CM

## 2020-05-19 DIAGNOSIS — E1165 Type 2 diabetes mellitus with hyperglycemia: Secondary | ICD-10-CM | POA: Diagnosis not present

## 2020-05-19 LAB — CBC WITH DIFFERENTIAL (CANCER CENTER ONLY)
Abs Immature Granulocytes: 0.02 10*3/uL (ref 0.00–0.07)
Basophils Absolute: 0 10*3/uL (ref 0.0–0.1)
Basophils Relative: 1 %
Eosinophils Absolute: 0.3 10*3/uL (ref 0.0–0.5)
Eosinophils Relative: 4 %
HCT: 34.8 % — ABNORMAL LOW (ref 36.0–46.0)
Hemoglobin: 10.7 g/dL — ABNORMAL LOW (ref 12.0–15.0)
Immature Granulocytes: 0 %
Lymphocytes Relative: 20 %
Lymphs Abs: 1.4 10*3/uL (ref 0.7–4.0)
MCH: 25.7 pg — ABNORMAL LOW (ref 26.0–34.0)
MCHC: 30.7 g/dL (ref 30.0–36.0)
MCV: 83.7 fL (ref 80.0–100.0)
Monocytes Absolute: 1.3 10*3/uL — ABNORMAL HIGH (ref 0.1–1.0)
Monocytes Relative: 19 %
Neutro Abs: 4 10*3/uL (ref 1.7–7.7)
Neutrophils Relative %: 56 %
Platelet Count: 391 10*3/uL (ref 150–400)
RBC: 4.16 MIL/uL (ref 3.87–5.11)
RDW: 15.9 % — ABNORMAL HIGH (ref 11.5–15.5)
WBC Count: 7 10*3/uL (ref 4.0–10.5)
nRBC: 0 % (ref 0.0–0.2)

## 2020-05-19 MED ORDER — ATORVASTATIN CALCIUM 10 MG PO TABS: ORAL_TABLET | ORAL | 2 refills | Status: DC

## 2020-05-19 MED ORDER — HYDRALAZINE HCL 25 MG PO TABS: 25.0000 mg | ORAL_TABLET | Freq: Three times a day (TID) | ORAL | 1 refills | Status: DC

## 2020-05-19 MED ORDER — EPOETIN ALFA-EPBX 40000 UNIT/ML IJ SOLN
40000.0000 [IU] | Freq: Once | INTRAMUSCULAR | Status: AC
  Administered 2020-05-19: 40000 [IU] via SUBCUTANEOUS

## 2020-05-19 MED ORDER — EPOETIN ALFA-EPBX 40000 UNIT/ML IJ SOLN
INTRAMUSCULAR | Status: AC
  Filled 2020-05-19: qty 1

## 2020-05-19 MED ORDER — CARVEDILOL 6.25 MG PO TABS: ORAL_TABLET | ORAL | 1 refills | Status: DC

## 2020-05-19 NOTE — Telephone Encounter (Signed)
Refills sent in per request 

## 2020-05-19 NOTE — Patient Instructions (Signed)

## 2020-05-19 NOTE — Telephone Encounter (Signed)
*  STAT* If patient is at the pharmacy, call can be transferred to refill team.   1. Which medications need to be refilled? (please list name of each medication and dose if known)? carvedilol (COREG) 6.25 MG tablet atorvastatin (LIPITOR) 10 MG tablet hydrALAZINE (APRESOLINE) 25 MG tablet  2. Which pharmacy/location (including street and city if local pharmacy) is medication to be sent to? [facility] - [place], [place] - [place] [address]  3. Do they need a 30 day or 90 day supply? 90 day

## 2020-05-19 NOTE — Telephone Encounter (Signed)
Scheduled per 07/21 los, patient received updated calender.

## 2020-05-20 ENCOUNTER — Telehealth: Payer: Self-pay | Admitting: Cardiology

## 2020-05-20 DIAGNOSIS — I5031 Acute diastolic (congestive) heart failure: Secondary | ICD-10-CM

## 2020-05-20 DIAGNOSIS — I420 Dilated cardiomyopathy: Secondary | ICD-10-CM

## 2020-05-20 NOTE — Telephone Encounter (Signed)
New Message   Pt c/o swelling: STAT is pt has developed SOB within 24 hours  1) How much weight have you gained and in what time span?9lbs in one week  2) If swelling, where is the swelling located? Feet and ankles  3) Are you currently taking a fluid pill? yes  4) Are you currently SOB? no  5) Do you have a log of your daily weights (if so, list)? She does not have a log her doctor told her yesterday that she had gained 9lbs in a week  6) Have you gained 3 pounds in a day or 5 pounds in a week? yes  7) Have you traveled recently? no

## 2020-05-20 NOTE — Telephone Encounter (Signed)
Spoke with pt. She states she gets "a little out of breath" with activity, but does not have CP or a cough. She states she has been "forcing water down" because her family is encouraging her to increase her water intake, despite her heart failure. I advised her to decrease her water and salt intake until her excess fluid has been resolved.  Per Dr Agustin Cree, she will need to have a p-BNP and BMP drawn before he can make medication adjustments. She will attempt to get to the office today, but she may have to wait until the morning due to care taker issues for her husband.

## 2020-05-21 ENCOUNTER — Telehealth: Payer: Self-pay

## 2020-05-21 ENCOUNTER — Other Ambulatory Visit: Payer: Self-pay

## 2020-05-21 DIAGNOSIS — I5031 Acute diastolic (congestive) heart failure: Secondary | ICD-10-CM

## 2020-05-21 DIAGNOSIS — I420 Dilated cardiomyopathy: Secondary | ICD-10-CM

## 2020-05-21 NOTE — Telephone Encounter (Signed)
-----   Message from Truitt Merle, MD sent at 05/19/2020 10:57 PM EDT ----- She is moving to [facility] in 8-9 weeks, Cassie referred her to a cancer center there, please f/u with HIM to complete the referral and make sure she has appointment with them in about 8-9 weeks   Thanks  Krista Blue

## 2020-05-21 NOTE — Telephone Encounter (Signed)
Called Dr. Zola Button oncology in [facility] to f/u on referral that was made for Ms. Elizabeth Olsen due to her moving to [facility] and needing oncologist there to f/u for her anemia. Dr. Bennie Dallas office confirmed they received referral and going to reach out to Ms. Elizabeth Olsen today to schedule her.

## 2020-05-22 LAB — BASIC METABOLIC PANEL
BUN/Creatinine Ratio: 16 (ref 12–28)
BUN: 38 mg/dL — ABNORMAL HIGH (ref 8–27)
CO2: 25 mmol/L (ref 20–29)
Calcium: 8.9 mg/dL (ref 8.7–10.3)
Chloride: 90 mmol/L — ABNORMAL LOW (ref 96–106)
Creatinine, Ser: 2.41 mg/dL — ABNORMAL HIGH (ref 0.57–1.00)
GFR calc Af Amer: 22 mL/min/{1.73_m2} — ABNORMAL LOW (ref >59)
GFR calc non Af Amer: 19 mL/min/{1.73_m2} — ABNORMAL LOW (ref >59)
Glucose: 165 mg/dL — ABNORMAL HIGH (ref 65–99)
Potassium: 4.5 mmol/L (ref 3.5–5.2)
Sodium: 130 mmol/L — ABNORMAL LOW (ref 134–144)

## 2020-05-22 LAB — PRO B NATRIURETIC PEPTIDE: NT-Pro BNP: 3750 pg/mL — ABNORMAL HIGH (ref 0–738)

## 2020-05-24 ENCOUNTER — Telehealth: Payer: Self-pay | Admitting: Emergency Medicine

## 2020-05-24 NOTE — Telephone Encounter (Signed)
Patient's fluid issues are mostly kidney related.  She should check with her nephrologist or primary care physician.  Ejection fraction is preserved.

## 2020-05-24 NOTE — Telephone Encounter (Signed)
Spoke with patient's daughter with patient's verbal permission. She is wanting to know given patient's kidney function and heart failure status how much water can she drink per day. She is currently taking 4 mg of lasix twice daily. Will consult with Dr. Geraldo Pitter since Dr. Agustin Cree is out.

## 2020-05-24 NOTE — Telephone Encounter (Signed)
Called patient back and informed her to check with pcp or nephrologist, she verbally understood. No further questions.

## 2020-05-26 ENCOUNTER — Inpatient Hospital Stay: Payer: Medicare Other

## 2020-05-26 ENCOUNTER — Other Ambulatory Visit: Payer: Self-pay

## 2020-05-26 DIAGNOSIS — D638 Anemia in other chronic diseases classified elsewhere: Secondary | ICD-10-CM

## 2020-05-26 DIAGNOSIS — E1165 Type 2 diabetes mellitus with hyperglycemia: Secondary | ICD-10-CM | POA: Diagnosis not present

## 2020-05-26 LAB — CBC WITH DIFFERENTIAL (CANCER CENTER ONLY)
Abs Immature Granulocytes: 0.02 10*3/uL (ref 0.00–0.07)
Basophils Absolute: 0 10*3/uL (ref 0.0–0.1)
Basophils Relative: 1 %
Eosinophils Absolute: 0.3 10*3/uL (ref 0.0–0.5)
Eosinophils Relative: 4 %
HCT: 36.8 % (ref 36.0–46.0)
Hemoglobin: 11.1 g/dL — ABNORMAL LOW (ref 12.0–15.0)
Immature Granulocytes: 0 %
Lymphocytes Relative: 18 %
Lymphs Abs: 1.2 10*3/uL (ref 0.7–4.0)
MCH: 25.5 pg — ABNORMAL LOW (ref 26.0–34.0)
MCHC: 30.2 g/dL (ref 30.0–36.0)
MCV: 84.4 fL (ref 80.0–100.0)
Monocytes Absolute: 1.2 10*3/uL — ABNORMAL HIGH (ref 0.1–1.0)
Monocytes Relative: 17 %
Neutro Abs: 4.2 10*3/uL (ref 1.7–7.7)
Neutrophils Relative %: 60 %
Platelet Count: 425 10*3/uL — ABNORMAL HIGH (ref 150–400)
RBC: 4.36 MIL/uL (ref 3.87–5.11)
RDW: 16.2 % — ABNORMAL HIGH (ref 11.5–15.5)
WBC Count: 6.9 10*3/uL (ref 4.0–10.5)
nRBC: 0 % (ref 0.0–0.2)

## 2020-05-26 NOTE — Progress Notes (Signed)
Per parameters, pt. Will not receive Retacrit injection today, Pt. Hgb=11.1. Pt informed & gave pt copy of labs.

## 2020-06-01 ENCOUNTER — Ambulatory Visit: Payer: Medicare Other | Admitting: Cardiology

## 2020-06-02 ENCOUNTER — Inpatient Hospital Stay: Payer: Medicare Other

## 2020-06-02 ENCOUNTER — Other Ambulatory Visit: Payer: Self-pay

## 2020-06-02 ENCOUNTER — Inpatient Hospital Stay: Payer: Medicare Other | Attending: Hematology

## 2020-06-02 DIAGNOSIS — Z794 Long term (current) use of insulin: Secondary | ICD-10-CM | POA: Diagnosis not present

## 2020-06-02 DIAGNOSIS — E1165 Type 2 diabetes mellitus with hyperglycemia: Secondary | ICD-10-CM | POA: Diagnosis present

## 2020-06-02 DIAGNOSIS — D638 Anemia in other chronic diseases classified elsewhere: Secondary | ICD-10-CM | POA: Insufficient documentation

## 2020-06-02 LAB — CBC WITH DIFFERENTIAL (CANCER CENTER ONLY)
Abs Immature Granulocytes: 0.03 10*3/uL (ref 0.00–0.07)
Basophils Absolute: 0 10*3/uL (ref 0.0–0.1)
Basophils Relative: 0 %
Eosinophils Absolute: 0.3 10*3/uL (ref 0.0–0.5)
Eosinophils Relative: 3 %
HCT: 35.9 % — ABNORMAL LOW (ref 36.0–46.0)
Hemoglobin: 11 g/dL — ABNORMAL LOW (ref 12.0–15.0)
Immature Granulocytes: 0 %
Lymphocytes Relative: 17 %
Lymphs Abs: 1.5 10*3/uL (ref 0.7–4.0)
MCH: 25.1 pg — ABNORMAL LOW (ref 26.0–34.0)
MCHC: 30.6 g/dL (ref 30.0–36.0)
MCV: 82 fL (ref 80.0–100.0)
Monocytes Absolute: 1.1 10*3/uL — ABNORMAL HIGH (ref 0.1–1.0)
Monocytes Relative: 12 %
Neutro Abs: 6 10*3/uL (ref 1.7–7.7)
Neutrophils Relative %: 68 %
Platelet Count: 337 10*3/uL (ref 150–400)
RBC: 4.38 MIL/uL (ref 3.87–5.11)
RDW: 16.1 % — ABNORMAL HIGH (ref 11.5–15.5)
WBC Count: 8.9 10*3/uL (ref 4.0–10.5)
nRBC: 0 % (ref 0.0–0.2)

## 2020-06-02 NOTE — Progress Notes (Signed)
Pt HGB is 11 today no injection needed per drs orders copy of labs given to patient

## 2020-06-09 ENCOUNTER — Inpatient Hospital Stay: Payer: Medicare Other

## 2020-06-09 ENCOUNTER — Other Ambulatory Visit: Payer: Self-pay

## 2020-06-09 VITALS — BP 185/75 | HR 75 | Temp 97.9°F | Resp 20

## 2020-06-09 DIAGNOSIS — D638 Anemia in other chronic diseases classified elsewhere: Secondary | ICD-10-CM

## 2020-06-09 DIAGNOSIS — E1165 Type 2 diabetes mellitus with hyperglycemia: Secondary | ICD-10-CM | POA: Diagnosis not present

## 2020-06-09 LAB — CBC WITH DIFFERENTIAL (CANCER CENTER ONLY)
Abs Immature Granulocytes: 0.02 10*3/uL (ref 0.00–0.07)
Basophils Absolute: 0.1 10*3/uL (ref 0.0–0.1)
Basophils Relative: 1 %
Eosinophils Absolute: 0.2 10*3/uL (ref 0.0–0.5)
Eosinophils Relative: 2 %
HCT: 35.7 % — ABNORMAL LOW (ref 36.0–46.0)
Hemoglobin: 10.8 g/dL — ABNORMAL LOW (ref 12.0–15.0)
Immature Granulocytes: 0 %
Lymphocytes Relative: 17 %
Lymphs Abs: 1.4 10*3/uL (ref 0.7–4.0)
MCH: 24.9 pg — ABNORMAL LOW (ref 26.0–34.0)
MCHC: 30.3 g/dL (ref 30.0–36.0)
MCV: 82.4 fL (ref 80.0–100.0)
Monocytes Absolute: 0.9 10*3/uL (ref 0.1–1.0)
Monocytes Relative: 11 %
Neutro Abs: 5.7 10*3/uL (ref 1.7–7.7)
Neutrophils Relative %: 69 %
Platelet Count: 357 10*3/uL (ref 150–400)
RBC: 4.33 MIL/uL (ref 3.87–5.11)
RDW: 16.7 % — ABNORMAL HIGH (ref 11.5–15.5)
WBC Count: 8.3 10*3/uL (ref 4.0–10.5)
nRBC: 0 % (ref 0.0–0.2)

## 2020-06-09 MED ORDER — EPOETIN ALFA-EPBX 40000 UNIT/ML IJ SOLN
INTRAMUSCULAR | Status: AC
  Filled 2020-06-09: qty 1

## 2020-06-09 MED ORDER — EPOETIN ALFA-EPBX 40000 UNIT/ML IJ SOLN
40000.0000 [IU] | Freq: Once | INTRAMUSCULAR | Status: AC
  Administered 2020-06-09: 40000 [IU] via SUBCUTANEOUS

## 2020-06-09 NOTE — Progress Notes (Signed)
Per MD ok to give injection with current BP.

## 2020-06-09 NOTE — Patient Instructions (Signed)

## 2020-06-12 ENCOUNTER — Emergency Department (HOSPITAL_COMMUNITY)
Admission: EM | Admit: 2020-06-12 | Discharge: 2020-06-12 | Disposition: A | Discharge status: Home / Self Care | Payer: Medicare Other | Attending: Emergency Medicine | Admitting: Emergency Medicine

## 2020-06-12 ENCOUNTER — Other Ambulatory Visit: Payer: Self-pay

## 2020-06-12 ENCOUNTER — Encounter (HOSPITAL_COMMUNITY): Payer: Self-pay | Admitting: Emergency Medicine

## 2020-06-12 DIAGNOSIS — Z5321 Procedure and treatment not carried out due to patient leaving prior to being seen by health care provider: Secondary | ICD-10-CM | POA: Insufficient documentation

## 2020-06-12 DIAGNOSIS — M25551 Pain in right hip: Secondary | ICD-10-CM | POA: Insufficient documentation

## 2020-06-12 DIAGNOSIS — M79604 Pain in right leg: Secondary | ICD-10-CM | POA: Insufficient documentation

## 2020-06-12 NOTE — ED Notes (Signed)
Pt called for vitals x3 with no response. Pt not visualized in the lobby. Per registration, she did not answer when called to finish registration.

## 2020-06-12 NOTE — ED Notes (Signed)
Pt called for vitals x2

## 2020-06-12 NOTE — ED Notes (Signed)
Pt called for vitals x1

## 2020-06-12 NOTE — ED Triage Notes (Signed)
Pt. Ststed, I started having pain in my rt. Leg and hip last night. Something new.

## 2020-06-16 ENCOUNTER — Inpatient Hospital Stay: Payer: Medicare Other

## 2020-06-16 ENCOUNTER — Other Ambulatory Visit: Payer: Self-pay

## 2020-06-16 VITALS — BP 149/72 | HR 73 | Resp 18

## 2020-06-16 DIAGNOSIS — D638 Anemia in other chronic diseases classified elsewhere: Secondary | ICD-10-CM

## 2020-06-16 DIAGNOSIS — E1165 Type 2 diabetes mellitus with hyperglycemia: Secondary | ICD-10-CM | POA: Diagnosis not present

## 2020-06-16 LAB — CBC WITH DIFFERENTIAL (CANCER CENTER ONLY)
Abs Immature Granulocytes: 0.03 10*3/uL (ref 0.00–0.07)
Basophils Absolute: 0 10*3/uL (ref 0.0–0.1)
Basophils Relative: 1 %
Eosinophils Absolute: 0.3 10*3/uL (ref 0.0–0.5)
Eosinophils Relative: 4 %
HCT: 35.4 % — ABNORMAL LOW (ref 36.0–46.0)
Hemoglobin: 10.8 g/dL — ABNORMAL LOW (ref 12.0–15.0)
Immature Granulocytes: 0 %
Lymphocytes Relative: 16 %
Lymphs Abs: 1.2 10*3/uL (ref 0.7–4.0)
MCH: 25.1 pg — ABNORMAL LOW (ref 26.0–34.0)
MCHC: 30.5 g/dL (ref 30.0–36.0)
MCV: 82.1 fL (ref 80.0–100.0)
Monocytes Absolute: 1 10*3/uL (ref 0.1–1.0)
Monocytes Relative: 13 %
Neutro Abs: 5.1 10*3/uL (ref 1.7–7.7)
Neutrophils Relative %: 66 %
Platelet Count: 449 10*3/uL — ABNORMAL HIGH (ref 150–400)
RBC: 4.31 MIL/uL (ref 3.87–5.11)
RDW: 17.8 % — ABNORMAL HIGH (ref 11.5–15.5)
WBC Count: 7.7 10*3/uL (ref 4.0–10.5)
nRBC: 0 % (ref 0.0–0.2)

## 2020-06-16 MED ORDER — EPOETIN ALFA-EPBX 40000 UNIT/ML IJ SOLN
INTRAMUSCULAR | Status: AC
  Filled 2020-06-16: qty 1

## 2020-06-16 MED ORDER — EPOETIN ALFA-EPBX 40000 UNIT/ML IJ SOLN
40000.0000 [IU] | Freq: Once | INTRAMUSCULAR | Status: AC
  Administered 2020-06-16: 40000 [IU] via SUBCUTANEOUS

## 2020-06-16 MED ORDER — CYANOCOBALAMIN 1000 MCG/ML IJ SOLN
INTRAMUSCULAR | Status: AC
  Filled 2020-06-16: qty 1

## 2020-06-16 NOTE — Patient Instructions (Signed)

## 2020-06-23 ENCOUNTER — Inpatient Hospital Stay: Payer: Medicare Other

## 2020-06-23 ENCOUNTER — Other Ambulatory Visit: Payer: Self-pay

## 2020-06-23 DIAGNOSIS — E1165 Type 2 diabetes mellitus with hyperglycemia: Secondary | ICD-10-CM | POA: Diagnosis not present

## 2020-06-23 DIAGNOSIS — D638 Anemia in other chronic diseases classified elsewhere: Secondary | ICD-10-CM

## 2020-06-23 LAB — CBC WITH DIFFERENTIAL (CANCER CENTER ONLY)
Abs Immature Granulocytes: 0.02 10*3/uL (ref 0.00–0.07)
Basophils Absolute: 0.1 10*3/uL (ref 0.0–0.1)
Basophils Relative: 1 %
Eosinophils Absolute: 0.2 10*3/uL (ref 0.0–0.5)
Eosinophils Relative: 3 %
HCT: 37.7 % (ref 36.0–46.0)
Hemoglobin: 11.3 g/dL — ABNORMAL LOW (ref 12.0–15.0)
Immature Granulocytes: 0 %
Lymphocytes Relative: 16 %
Lymphs Abs: 1.2 10*3/uL (ref 0.7–4.0)
MCH: 24.7 pg — ABNORMAL LOW (ref 26.0–34.0)
MCHC: 30 g/dL (ref 30.0–36.0)
MCV: 82.5 fL (ref 80.0–100.0)
Monocytes Absolute: 1 10*3/uL (ref 0.1–1.0)
Monocytes Relative: 13 %
Neutro Abs: 5.1 10*3/uL (ref 1.7–7.7)
Neutrophils Relative %: 67 %
Platelet Count: 435 10*3/uL — ABNORMAL HIGH (ref 150–400)
RBC: 4.57 MIL/uL (ref 3.87–5.11)
RDW: 18.4 % — ABNORMAL HIGH (ref 11.5–15.5)
WBC Count: 7.6 10*3/uL (ref 4.0–10.5)
nRBC: 0 % (ref 0.0–0.2)

## 2020-06-23 NOTE — Progress Notes (Signed)
Pt HGB is 11.3 today no injection needed per drs orders copy of labs given to patient

## 2020-06-28 ENCOUNTER — Ambulatory Visit: Payer: Medicare Other | Admitting: Cardiology

## 2020-06-30 ENCOUNTER — Other Ambulatory Visit: Payer: Self-pay

## 2020-06-30 ENCOUNTER — Inpatient Hospital Stay: Payer: Medicare Other

## 2020-06-30 ENCOUNTER — Inpatient Hospital Stay: Payer: Medicare Other | Attending: Hematology

## 2020-06-30 DIAGNOSIS — D638 Anemia in other chronic diseases classified elsewhere: Secondary | ICD-10-CM | POA: Diagnosis present

## 2020-06-30 DIAGNOSIS — E1165 Type 2 diabetes mellitus with hyperglycemia: Secondary | ICD-10-CM | POA: Diagnosis present

## 2020-06-30 LAB — CBC WITH DIFFERENTIAL (CANCER CENTER ONLY)
Abs Immature Granulocytes: 0.02 10*3/uL (ref 0.00–0.07)
Basophils Absolute: 0.1 10*3/uL (ref 0.0–0.1)
Basophils Relative: 1 %
Eosinophils Absolute: 0.3 10*3/uL (ref 0.0–0.5)
Eosinophils Relative: 4 %
HCT: 37.4 % (ref 36.0–46.0)
Hemoglobin: 11.4 g/dL — ABNORMAL LOW (ref 12.0–15.0)
Immature Granulocytes: 0 %
Lymphocytes Relative: 20 %
Lymphs Abs: 1.4 10*3/uL (ref 0.7–4.0)
MCH: 25.1 pg — ABNORMAL LOW (ref 26.0–34.0)
MCHC: 30.5 g/dL (ref 30.0–36.0)
MCV: 82.4 fL (ref 80.0–100.0)
Monocytes Absolute: 0.9 10*3/uL (ref 0.1–1.0)
Monocytes Relative: 13 %
Neutro Abs: 4.4 10*3/uL (ref 1.7–7.7)
Neutrophils Relative %: 62 %
Platelet Count: 337 10*3/uL (ref 150–400)
RBC: 4.54 MIL/uL (ref 3.87–5.11)
RDW: 18.6 % — ABNORMAL HIGH (ref 11.5–15.5)
WBC Count: 7.1 10*3/uL (ref 4.0–10.5)
nRBC: 0 % (ref 0.0–0.2)

## 2020-06-30 NOTE — Progress Notes (Signed)
HGB 11.4 today. Per Dr Burr Medico no injection needed to day. Pt given print out of lab results

## 2020-07-07 ENCOUNTER — Inpatient Hospital Stay: Payer: Medicare Other

## 2020-07-07 ENCOUNTER — Other Ambulatory Visit: Payer: Self-pay

## 2020-07-07 VITALS — BP 174/76 | HR 86 | Resp 18

## 2020-07-07 DIAGNOSIS — D638 Anemia in other chronic diseases classified elsewhere: Secondary | ICD-10-CM

## 2020-07-07 DIAGNOSIS — E1165 Type 2 diabetes mellitus with hyperglycemia: Secondary | ICD-10-CM | POA: Diagnosis not present

## 2020-07-07 LAB — CBC WITH DIFFERENTIAL (CANCER CENTER ONLY)
Abs Immature Granulocytes: 0.02 10*3/uL (ref 0.00–0.07)
Basophils Absolute: 0.1 10*3/uL (ref 0.0–0.1)
Basophils Relative: 1 %
Eosinophils Absolute: 0.2 10*3/uL (ref 0.0–0.5)
Eosinophils Relative: 3 %
HCT: 35.2 % — ABNORMAL LOW (ref 36.0–46.0)
Hemoglobin: 10.8 g/dL — ABNORMAL LOW (ref 12.0–15.0)
Immature Granulocytes: 0 %
Lymphocytes Relative: 17 %
Lymphs Abs: 1.2 10*3/uL (ref 0.7–4.0)
MCH: 25.1 pg — ABNORMAL LOW (ref 26.0–34.0)
MCHC: 30.7 g/dL (ref 30.0–36.0)
MCV: 81.9 fL (ref 80.0–100.0)
Monocytes Absolute: 0.7 10*3/uL (ref 0.1–1.0)
Monocytes Relative: 11 %
Neutro Abs: 4.6 10*3/uL (ref 1.7–7.7)
Neutrophils Relative %: 68 %
Platelet Count: 266 10*3/uL (ref 150–400)
RBC: 4.3 MIL/uL (ref 3.87–5.11)
RDW: 18.3 % — ABNORMAL HIGH (ref 11.5–15.5)
WBC Count: 6.8 10*3/uL (ref 4.0–10.5)
nRBC: 0 % (ref 0.0–0.2)

## 2020-07-07 MED ORDER — EPOETIN ALFA-EPBX 40000 UNIT/ML IJ SOLN
40000.0000 [IU] | Freq: Once | INTRAMUSCULAR | Status: AC
  Administered 2020-07-07: 40000 [IU] via SUBCUTANEOUS

## 2020-07-07 MED ORDER — EPOETIN ALFA-EPBX 40000 UNIT/ML IJ SOLN
INTRAMUSCULAR | Status: AC
  Filled 2020-07-07: qty 1

## 2020-07-07 MED ORDER — CYANOCOBALAMIN 1000 MCG/ML IJ SOLN
INTRAMUSCULAR | Status: AC
  Filled 2020-07-07: qty 1

## 2020-07-07 NOTE — Patient Instructions (Signed)

## 2020-07-12 ENCOUNTER — Telehealth: Payer: Self-pay

## 2020-07-12 NOTE — Telephone Encounter (Signed)
Elizabeth Olsen left vm requesting name and number of clinic we referred her to in [place].  I returned her call and gave the the contact informatiojn: [facility], [phone].

## 2020-07-14 ENCOUNTER — Ambulatory Visit: Payer: Medicare Other | Admitting: Hematology

## 2020-07-14 ENCOUNTER — Ambulatory Visit: Payer: Medicare Other

## 2020-07-14 ENCOUNTER — Other Ambulatory Visit: Payer: Medicare Other

## 2020-08-09 ENCOUNTER — Other Ambulatory Visit: Payer: Self-pay | Admitting: Cardiology

## 2020-08-09 NOTE — Telephone Encounter (Signed)
Refill sent to pharmacy.   

## 2020-09-21 DIAGNOSIS — N186 End stage renal disease: Secondary | ICD-10-CM | POA: Diagnosis present

## 2020-09-21 HISTORY — DX: End stage renal disease: N18.6

## 2020-10-31 DIAGNOSIS — D509 Iron deficiency anemia, unspecified: Secondary | ICD-10-CM | POA: Diagnosis not present

## 2020-10-31 DIAGNOSIS — D631 Anemia in chronic kidney disease: Secondary | ICD-10-CM | POA: Diagnosis not present

## 2020-10-31 DIAGNOSIS — Z23 Encounter for immunization: Secondary | ICD-10-CM | POA: Diagnosis not present

## 2020-10-31 DIAGNOSIS — N2581 Secondary hyperparathyroidism of renal origin: Secondary | ICD-10-CM | POA: Diagnosis not present

## 2020-10-31 DIAGNOSIS — N186 End stage renal disease: Secondary | ICD-10-CM | POA: Diagnosis not present

## 2020-10-31 DIAGNOSIS — Z992 Dependence on renal dialysis: Secondary | ICD-10-CM | POA: Diagnosis not present

## 2021-02-14 DIAGNOSIS — C9002 Multiple myeloma in relapse: Secondary | ICD-10-CM | POA: Insufficient documentation

## 2021-02-14 HISTORY — DX: Multiple myeloma in relapse: C90.02

## 2023-05-04 ENCOUNTER — Inpatient Hospital Stay (HOSPITAL_COMMUNITY)
Admission: EM | Admit: 2023-05-04 | Discharge: 2023-05-06 | DRG: 308 | Disposition: A | Discharge status: Home / Self Care | Payer: 59 | Attending: Student in an Organized Health Care Education/Training Program | Admitting: Student in an Organized Health Care Education/Training Program

## 2023-05-04 ENCOUNTER — Other Ambulatory Visit: Payer: Self-pay

## 2023-05-04 ENCOUNTER — Encounter: Payer: Self-pay | Admitting: Hematology

## 2023-05-04 DIAGNOSIS — E871 Hypo-osmolality and hyponatremia: Secondary | ICD-10-CM | POA: Diagnosis present

## 2023-05-04 DIAGNOSIS — Z888 Allergy status to other drugs, medicaments and biological substances status: Secondary | ICD-10-CM

## 2023-05-04 DIAGNOSIS — Z8616 Personal history of COVID-19: Secondary | ICD-10-CM

## 2023-05-04 DIAGNOSIS — Z881 Allergy status to other antibiotic agents status: Secondary | ICD-10-CM

## 2023-05-04 DIAGNOSIS — Z9641 Presence of insulin pump (external) (internal): Secondary | ICD-10-CM | POA: Diagnosis present

## 2023-05-04 DIAGNOSIS — Z800 Family history of malignant neoplasm of digestive organs: Secondary | ICD-10-CM

## 2023-05-04 DIAGNOSIS — Z8249 Family history of ischemic heart disease and other diseases of the circulatory system: Secondary | ICD-10-CM

## 2023-05-04 DIAGNOSIS — Z9012 Acquired absence of left breast and nipple: Secondary | ICD-10-CM

## 2023-05-04 DIAGNOSIS — Z992 Dependence on renal dialysis: Secondary | ICD-10-CM

## 2023-05-04 DIAGNOSIS — Z833 Family history of diabetes mellitus: Secondary | ICD-10-CM

## 2023-05-04 DIAGNOSIS — Z880 Allergy status to penicillin: Secondary | ICD-10-CM

## 2023-05-04 DIAGNOSIS — I420 Dilated cardiomyopathy: Secondary | ICD-10-CM | POA: Diagnosis present

## 2023-05-04 DIAGNOSIS — M25552 Pain in left hip: Secondary | ICD-10-CM | POA: Diagnosis present

## 2023-05-04 DIAGNOSIS — I480 Paroxysmal atrial fibrillation: Principal | ICD-10-CM | POA: Diagnosis present

## 2023-05-04 DIAGNOSIS — Z853 Personal history of malignant neoplasm of breast: Secondary | ICD-10-CM

## 2023-05-04 DIAGNOSIS — K219 Gastro-esophageal reflux disease without esophagitis: Secondary | ICD-10-CM | POA: Diagnosis present

## 2023-05-04 DIAGNOSIS — I132 Hypertensive heart and chronic kidney disease with heart failure and with stage 5 chronic kidney disease, or end stage renal disease: Secondary | ICD-10-CM | POA: Diagnosis present

## 2023-05-04 DIAGNOSIS — Z79890 Hormone replacement therapy (postmenopausal): Secondary | ICD-10-CM

## 2023-05-04 DIAGNOSIS — N186 End stage renal disease: Secondary | ICD-10-CM | POA: Diagnosis not present

## 2023-05-04 DIAGNOSIS — I5042 Chronic combined systolic (congestive) and diastolic (congestive) heart failure: Secondary | ICD-10-CM | POA: Diagnosis present

## 2023-05-04 DIAGNOSIS — I2510 Atherosclerotic heart disease of native coronary artery without angina pectoris: Secondary | ICD-10-CM | POA: Diagnosis present

## 2023-05-04 DIAGNOSIS — R54 Age-related physical debility: Secondary | ICD-10-CM | POA: Diagnosis present

## 2023-05-04 DIAGNOSIS — Z7982 Long term (current) use of aspirin: Secondary | ICD-10-CM

## 2023-05-04 DIAGNOSIS — D638 Anemia in other chronic diseases classified elsewhere: Secondary | ICD-10-CM | POA: Diagnosis present

## 2023-05-04 DIAGNOSIS — Z882 Allergy status to sulfonamides status: Secondary | ICD-10-CM

## 2023-05-04 DIAGNOSIS — E109 Type 1 diabetes mellitus without complications: Secondary | ICD-10-CM | POA: Diagnosis present

## 2023-05-04 DIAGNOSIS — E039 Hypothyroidism, unspecified: Secondary | ICD-10-CM | POA: Diagnosis present

## 2023-05-04 DIAGNOSIS — Z955 Presence of coronary angioplasty implant and graft: Secondary | ICD-10-CM

## 2023-05-04 DIAGNOSIS — E1022 Type 1 diabetes mellitus with diabetic chronic kidney disease: Secondary | ICD-10-CM | POA: Diagnosis present

## 2023-05-04 DIAGNOSIS — I4891 Unspecified atrial fibrillation: Secondary | ICD-10-CM | POA: Diagnosis present

## 2023-05-04 DIAGNOSIS — Z794 Long term (current) use of insulin: Secondary | ICD-10-CM

## 2023-05-04 DIAGNOSIS — Z79899 Other long term (current) drug therapy: Secondary | ICD-10-CM

## 2023-05-04 DIAGNOSIS — N250 Renal osteodystrophy: Secondary | ICD-10-CM | POA: Diagnosis present

## 2023-05-04 DIAGNOSIS — E785 Hyperlipidemia, unspecified: Secondary | ICD-10-CM | POA: Diagnosis present

## 2023-05-04 LAB — COMPREHENSIVE METABOLIC PANEL
ALT: 9 U/L (ref 0–44)
AST: 16 U/L (ref 15–41)
Albumin: 3.3 g/dL — ABNORMAL LOW (ref 3.5–5.0)
Alkaline Phosphatase: 75 U/L (ref 38–126)
Anion gap: 10 (ref 5–15)
BUN: 60 mg/dL — ABNORMAL HIGH (ref 8–23)
CO2: 26 mmol/L (ref 22–32)
Calcium: 8.3 mg/dL — ABNORMAL LOW (ref 8.9–10.3)
Chloride: 96 mmol/L — ABNORMAL LOW (ref 98–111)
Creatinine, Ser: 4.34 mg/dL — ABNORMAL HIGH (ref 0.44–1.00)
GFR, Estimated: 10 mL/min — ABNORMAL LOW (ref >60)
Glucose, Bld: 146 mg/dL — ABNORMAL HIGH (ref 70–99)
Potassium: 4.8 mmol/L (ref 3.5–5.1)
Sodium: 132 mmol/L — ABNORMAL LOW (ref 135–145)
Total Bilirubin: 0.4 mg/dL (ref 0.3–1.2)
Total Protein: 6.4 g/dL — ABNORMAL LOW (ref 6.5–8.1)

## 2023-05-04 LAB — CBC WITH DIFFERENTIAL/PLATELET
Abs Immature Granulocytes: 0.03 10*3/uL (ref 0.00–0.07)
Basophils Absolute: 0 10*3/uL (ref 0.0–0.1)
Basophils Relative: 1 %
Eosinophils Absolute: 0.3 10*3/uL (ref 0.0–0.5)
Eosinophils Relative: 4 %
HCT: 29.3 % — ABNORMAL LOW (ref 36.0–46.0)
Hemoglobin: 8.9 g/dL — ABNORMAL LOW (ref 12.0–15.0)
Immature Granulocytes: 0 %
Lymphocytes Relative: 15 %
Lymphs Abs: 1.1 10*3/uL (ref 0.7–4.0)
MCH: 29.9 pg (ref 26.0–34.0)
MCHC: 30.4 g/dL (ref 30.0–36.0)
MCV: 98.3 fL (ref 80.0–100.0)
Monocytes Absolute: 1.2 10*3/uL — ABNORMAL HIGH (ref 0.1–1.0)
Monocytes Relative: 15 %
Neutro Abs: 5 10*3/uL (ref 1.7–7.7)
Neutrophils Relative %: 65 %
Platelets: 257 10*3/uL (ref 150–400)
RBC: 2.98 MIL/uL — ABNORMAL LOW (ref 3.87–5.11)
RDW: 13.3 % (ref 11.5–15.5)
WBC: 7.7 10*3/uL (ref 4.0–10.5)
nRBC: 0 % (ref 0.0–0.2)

## 2023-05-04 MED ORDER — ANTICOAGULANT SODIUM CITRATE 4% (200MG/5ML) IV SOLN: 5.0000 mL | Status: DC | PRN

## 2023-05-04 MED ORDER — CHLORHEXIDINE GLUCONATE CLOTH 2 % EX PADS
6.0000 | MEDICATED_PAD | Freq: Every day | CUTANEOUS | Status: DC
  Administered 2023-05-06: 6 via TOPICAL

## 2023-05-04 MED ORDER — LIDOCAINE HCL (PF) 1 % IJ SOLN: 5.0000 mL | INTRAMUSCULAR | Status: DC | PRN

## 2023-05-04 MED ORDER — HEPARIN SODIUM (PORCINE) 1000 UNIT/ML DIALYSIS: 20.0000 [IU]/kg | INTRAMUSCULAR | Status: DC | PRN

## 2023-05-04 MED ORDER — PENTAFLUOROPROP-TETRAFLUOROETH EX AERO: 1.0000 | INHALATION_SPRAY | CUTANEOUS | Status: DC | PRN

## 2023-05-04 MED ORDER — HEPARIN SODIUM (PORCINE) 1000 UNIT/ML DIALYSIS: 1000.0000 [IU] | INTRAMUSCULAR | Status: DC | PRN

## 2023-05-04 MED ORDER — ALTEPLASE 2 MG IJ SOLR: 2.0000 mg | Freq: Once | INTRAMUSCULAR | Status: DC | PRN

## 2023-05-04 NOTE — Progress Notes (Addendum)
Contacted by nephrologist regarding pt's case. Advised by provider that pt is new in the area and is need of in-center HD placement. Pt's home clinic in [facility] was supposed to make a referral for a local clinic. Contacted [facility] admissions to inquire if pt's home clinic has made a referral for a local clinic. Awaiting a response. Pt to need in-center HD placement and then referral be made to home therapy for PD. Nephrologist reports that pt has a PD catheter that will be flushed today and that pt will receive HD today as well. If pt is d/c from ED, navigator can f/u with pt/pt's family with any updates received from [facility] admissions.    Olivia Canter Renal Navigator [phone]  Addendum at 5:25 pm: Per [facility] admissions, clinicals/labs will have to be submitted from ED visit then local clinic can review referral. Pt's clinic had made a referral but there was never final acceptance/approval for treatment at local clinic. Update provided to pt's daughter via phone. Update provided to nephrologist and renal PA as well.

## 2023-05-04 NOTE — ED Provider Notes (Signed)
[place] EMERGENC[facility] DEPARTMENT AT [facility] Provider Note   CSN: [id number] Arrival date & time: 05/04/23  1023     History  Chief Complaint  Patient presents with   Vascular Access Problem    Elizabeth Olsen is a 82 y.o. female.  HPI     82 year old female with a history of systolic and diastolic CHF, breast cancer, anemia, diabetes, hypertension, hyperlipidemia admission for CHF in [place], ESRD with plans to transition from hemodialysis to peritoneal dialysis and split her time between [place] and [place], with recent PD catheter placement May 22, with revision of the catheter June 26, who was seen by her surgeon Dr. Alvie Heidelberg in [facility] on July 1 and cleared for peritoneal dialysis, who presents with concern for need for dialysis.  Patient is accompanied by her daughter at bedside.  They are feeling frustrated as they had been planning on a transfer of her dialysis and to her to [place] for several months, and had paperwork telling them to go to [facility], however when they went to [facility] for dialysis today, the dialysis center was not aware of her.  Missed lab because family reports that they were told that all the paperwork was faxed up and they would be able to continue with hemodialysis and peritoneal dialysis training in association with her new dialysis center.  There is an apparent lack of communication between the facilities, and they present to the emergency department after they were instructed to come when they were told they did not have the information needed from her previous center or a reserved seat for HD.  She is fatigued after driving so far yesterday. Briefly this AM had some dyspnea but it resolved when she sat up.  She denies any continuing shortness of breath, denies chest pain, denies any significant abdominal pain, fever, nausea, vomiting, diarrhea, new leg pain or swelling.  She does have neuropathy and pain associated  with that.  She does not have any urinary symptoms, headache or other acute concerns.  She and her family are primarily here for dialysis.  She had dialysis last on Tuesday, and normally goes Tuesday Thursday Saturday.  They are looking forward to PD training, report that they were able to manually do PD for 2 days in [place].  There have been concerns of bleeding and they had started training, however when they saw Dr. Alvie Heidelberg on July 1 he evaluated it and felt that they are experiencing was expected in the setting of recent revision.  Past Medical History:  Diagnosis Date   Acquired hypothyroidism 10/13/2017   Acute diastolic CHF (congestive heart failure) (HCC) 11/20/2019   Acute respiratory failure with hypoxia (HCC) 10/13/2017   Acute systolic heart failure (HCC) 10/13/2017   Anemia    Anemia associated with diabetes mellitus (HCC) 10/13/2017   Anemia of chronic disease 12/10/2017   Blood transfusion    Blood transfusion without reported diagnosis    with breast surgery   Breast cancer (HCC) 15 years ago   left    Cardiomyopathy (HCC) 07/23/2017   Ejection fraction 45-50% may; from September 2018   CHF (congestive heart failure) (HCC)    Chronic cholecystitis with calculus 01/22/2018   Colon polyp 03/2007   adenomatous   COVID-19 virus infection 11/20/2019   Diabetes mellitus    dx 1998.  was told prior to getting chemo that her bld sugar rose.  She thought it would go back   Dyspnea  d/t anemia   Essential hypertension 09/11/2018   GERD (gastroesophageal reflux disease)    Heart murmur    as child   Hernia, incisional    Hyperlipidemia    Hypertension    Hypothyroidism    Iron deficiency 07/23/2017   Near syncope 04/01/2018   Refractory anemia, unspecified (HCC) 12/10/2017   Type 1 diabetes (HCC) 07/23/2017   Vertigo 06/24/2018   Vitamin D deficiency      Home Medications Prior to Admission medications   Medication Sig Start Date End Date Taking? Authorizing Provider   amLODipine (NORVASC) 5 MG tablet Take 5 mg by mouth every morning. 04/30/20   [provider]  ASPIRIN ADULT LOW STRENGTH 81 MG EC tablet Take 81 mg by mouth every morning. 12/26/19   [provider]  atorvastatin (LIPITOR) 10 MG tablet TAKE 1 TABLET BY MOUTH EVERY DAY ONE TIME ONLY 05/19/20   Georgeanna Lea, MD  calcium carbonate (OSCAL) 1500 (600 Ca) MG TABS tablet Take 600 mg by mouth daily with breakfast.     [provider]  carvedilol (COREG) 6.25 MG tablet TAKE 1 TABLET BY MOUTH WITH BREAKFAST & EVENING MEAL EVERY DAY 05/19/20   Georgeanna Lea, MD  Continuous Blood Gluc Sensor (FREESTYLE LIBRE 14 DAY SENSOR) MISC See admin instructions. 10/22/19   [provider]  fenofibrate 160 MG tablet Take 160 mg by mouth daily.    [provider]  fish oil-omega-3 fatty acids 1000 MG capsule Take 1 g by mouth daily.     [provider]  furosemide (LASIX) 20 MG tablet TAKE 2 TABLETS BY MOUTH EVERY DAY 08/09/20   Georgeanna Lea, MD  furosemide (LASIX) 40 MG tablet Take by mouth 2 (two) times daily. 05/07/20   [provider]  HUMALOG 100 UNIT/ML injection USE as directed via insulin pump. max total daily DOSE of 50 UNITS 02/12/20   [provider]  hydrALAZINE (APRESOLINE) 25 MG tablet Take 1 tablet (25 mg total) by mouth 3 (three) times daily. 05/19/20 08/17/20  Georgeanna Lea, MD  Insulin Disposable Pump (OMNIPOD DASH 5 PACK PODS) MISC SMARTSIG:SUB-Q Every 3 Days 05/11/20   [provider]  insulin glargine (LANTUS SOLOSTAR) 100 UNIT/ML Solostar Pen Inject 7 Units into the skin in the morning. And 6 units at night 01/26/20   [provider]  iron polysaccharides (NIFEREX) 150 MG capsule Take 150 mg by mouth 2 (two) times daily.     [provider]  levothyroxine (SYNTHROID) 112 MCG tablet Take one tablet 5 days of the week and HOLD 2 days of the week. 01/27/20   [provider]  ONETOUCH  VERIO test strip USE TO TEST UP TO 10 TIMES D 08/24/17   [provider]  pantoprazole (PROTONIX) 40 MG tablet Take 1 tablet (40 mg total) by mouth 2 (two) times daily. 11/27/19   Kathlen Mody, MD  vitamin C (ASCORBIC ACID) 500 MG tablet Take 500 mg by mouth daily.    [provider]  Vitamin D, Ergocalciferol, (DRISDOL) 50000 UNITS CAPS Take 50,000 Units by mouth 2 (two) times a week. Sundays & Wednesdays. 02/20/12   [provider]  zinc sulfate 220 (50 Zn) MG capsule Take 1 capsule (220 mg total) by mouth daily. 11/28/19   Kathlen Mody, MD      Allergies    Erythromycin, Penicillins, Nyquil [pseudoeph-doxylamine-dm-apap], and Sulfa antibiotics    Review of Systems   Review of Systems  Physical Exam Updated  Vital Signs BP (!) 156/51   Pulse 82   Temp 98.4 F (36.9 C) (Oral)   Resp 17   Ht 5\' 2"  (1.575 m)   Wt 68 kg   SpO2 97%   BMI 27.42 kg/m  Physical Exam Vitals and nursing note reviewed.  Constitutional:      General: She is not in acute distress.    Appearance: She is well-developed. She is not diaphoretic.  HENT:     Head: Normocephalic and atraumatic.  Eyes:     Conjunctiva/sclera: Conjunctivae normal.  Cardiovascular:     Rate and Rhythm: Normal rate and regular rhythm.     Heart sounds: Normal heart sounds. No murmur heard.    No friction rub. No gallop.  Pulmonary:     Effort: Pulmonary effort is normal. No respiratory distress.     Breath sounds: Normal breath sounds. No wheezing or rales.  Abdominal:     General: There is no distension.     Palpations: Abdomen is soft.     Tenderness: There is no abdominal tenderness. There is no guarding.     Comments: PD catheter inplace, no erythema Soft nontender abdomen  Musculoskeletal:        General: No tenderness (reports pain bilateral legs from neuropathy, no asymmetric swelling).     Cervical back: Normal range of motion.  Skin:    General: Skin is warm and dry.     Findings: No  erythema or rash.  Neurological:     Mental Status: She is alert and oriented to person, place, and time.     ED Results / Procedures / Treatments   Labs (all labs ordered are listed, but only abnormal results are displayed) Labs Reviewed  CBC WITH DIFFERENTIAL/PLATELET - Abnormal; Notable for the following components:      Result Value   RBC 2.98 (*)    Hemoglobin 8.9 (*)    HCT 29.3 (*)    Monocytes Absolute 1.2 (*)    All other components within normal limits  COMPREHENSIVE METABOLIC PANEL - Abnormal; Notable for the following components:   Sodium 132 (*)    Chloride 96 (*)    Glucose, Bld 146 (*)    BUN 60 (*)    Creatinine, Ser 4.34 (*)    Calcium 8.3 (*)    Total Protein 6.4 (*)    Albumin 3.3 (*)    GFR, Estimated 10 (*)    All other components within normal limits  HEPATITIS B SURFACE ANTIGEN  HEPATITIS B SURFACE ANTIBODY, QUANTITATIVE    EKG EKG Interpretation Date/Time:  Friday May 04 2023 13:00:09 EDT Ventricular Rate:  79 PR Interval:  177 QRS Duration:  97 QT Interval:  442 QTC Calculation: 507 R Axis:   104  Text Interpretation: Sinus rhythm Right axis deviation Low voltage, extremity leads Prolonged QT interval Confirmed by Virgina Norfolk [phone]) on 05/04/2023 4:07:53 PM  Radiology No results found.  Procedures Procedures    Medications Ordered in ED Medications  Chlorhexidine Gluconate Cloth 2 % PADS 6 each (has no administration in time range)  pentafluoroprop-tetrafluoroeth (GEBAUERS) aerosol 1 Application (has no administration in time range)  lidocaine (PF) (XYLOCAINE) 1 % injection 5 mL (has no administration in time range)  heparin injection 1,000 Units (has no administration in time range)  anticoagulant sodium citrate solution 5 mL (has no administration in time range)  alteplase (CATHFLO ACTIVASE) injection 2 mg (has no administration in time range)  heparin injection 1,400 Units (has no  administration in time range)    ED Course/  Medical Decision Making/ A&P                              82 year old female with a history of systolic and diastolic CHF, breast cancer, anemia, diabetes, hypertension, hyperlipidemia admission for CHF in [place], ESRD with plans to transition from hemodialysis to peritoneal dialysis and split her time between [place] and [place], with recent PD catheter placement May 22, with revision of the catheter June 26, who was seen by her surgeon Dr. Alvie Heidelberg in [facility] on July 1 and cleared for peritoneal dialysis, who presents with concern for need for dialysis.  Labs are completed and personally evaluated and interperted by me-hemoglobin 8.9, decreased from prior in 2021 of 10.8, however no symptoms to suggest acute bleeding, and suspect continued anemia of chronic disease.  CMP shows a normal potassium, creatinine of 4.3.  Her ROS is negative for new changes, had brief mild dyspnea this AM that improved--no continued shortness of breath, normal oxygenation on room air, no asymmetric leg swelling, low clinical suspicion for pulmonary embolus, and suspect her mild dyspnea when laying down today was likely due to mild volume overload in the setting of her missing dialysis yesterday.  She does not have signs on exam to suggest pneumothorax, or history to suggest pneumonia.  Consulted nephrology regarding her dialysis needs.  Nephrology saw her, and ordered dialysis for tonight, 2 to 3 L UF as tolerated, the dialysis RN was asked to flush her PD catheter, and the renal navigator is aware of the situation and working on placement, with tentative plan that she can return home after dialysis today.        Final Clinical Impression(s) / ED Diagnoses Final diagnoses:  ESRD (end stage renal disease) (HCC)    Rx / DC Orders ED Discharge Orders     None         Alvira Monday, MD 05/04/23 2247

## 2023-05-04 NOTE — ED Triage Notes (Signed)
Pt to the ed form home needing dialysis. Pt is new to the area and thought she had a dialysis appt set up today, but was unable to get scheduled at dialysis center. Pt has no complainants at this time.

## 2023-05-04 NOTE — Progress Notes (Signed)
[facility] Brief Note  Elizabeth Olsen is an 82 year old woman with ESRD on HD. She has been on hemodialysis for the last 2 years in [place], [place]. The family has been arranging a move to [facility] and expected to start dialysis at the outpatient clinic today. Unfortunately she was unable to start today as the local clinic does not have the paperwork to admit the patient so she was referred to the ED.   Her last dialysis was 05/01/23. She has also started training for PD. The hope is that she can have family assist her with PD so she can split time between [place] and [place]. Her daughter who accompanies her today, lives in [place] and she has a son that lives locally. They have not completed PD training, she has had some issues and need PD cath revision surgery. Her daughter does mention that she does need to have the PD cath flushed.   Seen and examined in the ED. Blood pressure elevated but no signs of distress. Comfortable on room air. Minimal edema. PD cath in place with clean dressing. RUE AVF +bruit.    Plan -Will order dialysis tonight. UF 2-3L as tolerated  -Have asked dialysis RN to flush PD catheter as well -Renal navigator is aware of situation and working on placement -Hopefully can go home after dialysis    Tomasa Blase PA-C 05/04/2023 3:15 PM

## 2023-05-05 ENCOUNTER — Inpatient Hospital Stay (HOSPITAL_COMMUNITY): Payer: 59

## 2023-05-05 ENCOUNTER — Encounter (HOSPITAL_COMMUNITY): Payer: Self-pay | Admitting: Student in an Organized Health Care Education/Training Program

## 2023-05-05 ENCOUNTER — Other Ambulatory Visit: Payer: Self-pay

## 2023-05-05 DIAGNOSIS — Z880 Allergy status to penicillin: Secondary | ICD-10-CM | POA: Diagnosis not present

## 2023-05-05 DIAGNOSIS — Z882 Allergy status to sulfonamides status: Secondary | ICD-10-CM | POA: Diagnosis not present

## 2023-05-05 DIAGNOSIS — Z79890 Hormone replacement therapy (postmenopausal): Secondary | ICD-10-CM | POA: Diagnosis not present

## 2023-05-05 DIAGNOSIS — I5020 Unspecified systolic (congestive) heart failure: Secondary | ICD-10-CM

## 2023-05-05 DIAGNOSIS — Z8616 Personal history of COVID-19: Secondary | ICD-10-CM | POA: Diagnosis not present

## 2023-05-05 DIAGNOSIS — Z794 Long term (current) use of insulin: Secondary | ICD-10-CM | POA: Diagnosis not present

## 2023-05-05 DIAGNOSIS — N186 End stage renal disease: Secondary | ICD-10-CM | POA: Diagnosis not present

## 2023-05-05 DIAGNOSIS — I5032 Chronic diastolic (congestive) heart failure: Secondary | ICD-10-CM

## 2023-05-05 DIAGNOSIS — E039 Hypothyroidism, unspecified: Secondary | ICD-10-CM

## 2023-05-05 DIAGNOSIS — I4891 Unspecified atrial fibrillation: Secondary | ICD-10-CM | POA: Diagnosis present

## 2023-05-05 DIAGNOSIS — Z992 Dependence on renal dialysis: Secondary | ICD-10-CM | POA: Diagnosis not present

## 2023-05-05 DIAGNOSIS — Z888 Allergy status to other drugs, medicaments and biological substances status: Secondary | ICD-10-CM | POA: Diagnosis not present

## 2023-05-05 DIAGNOSIS — I132 Hypertensive heart and chronic kidney disease with heart failure and with stage 5 chronic kidney disease, or end stage renal disease: Secondary | ICD-10-CM | POA: Diagnosis not present

## 2023-05-05 DIAGNOSIS — Z881 Allergy status to other antibiotic agents status: Secondary | ICD-10-CM | POA: Diagnosis not present

## 2023-05-05 DIAGNOSIS — E871 Hypo-osmolality and hyponatremia: Secondary | ICD-10-CM | POA: Diagnosis not present

## 2023-05-05 DIAGNOSIS — E1022 Type 1 diabetes mellitus with diabetic chronic kidney disease: Secondary | ICD-10-CM

## 2023-05-05 DIAGNOSIS — I5042 Chronic combined systolic (congestive) and diastolic (congestive) heart failure: Secondary | ICD-10-CM | POA: Diagnosis not present

## 2023-05-05 DIAGNOSIS — Z79899 Other long term (current) drug therapy: Secondary | ICD-10-CM | POA: Diagnosis not present

## 2023-05-05 DIAGNOSIS — Z9641 Presence of insulin pump (external) (internal): Secondary | ICD-10-CM | POA: Diagnosis present

## 2023-05-05 DIAGNOSIS — D638 Anemia in other chronic diseases classified elsewhere: Secondary | ICD-10-CM | POA: Diagnosis not present

## 2023-05-05 DIAGNOSIS — Z9012 Acquired absence of left breast and nipple: Secondary | ICD-10-CM | POA: Diagnosis not present

## 2023-05-05 DIAGNOSIS — Z955 Presence of coronary angioplasty implant and graft: Secondary | ICD-10-CM | POA: Diagnosis not present

## 2023-05-05 DIAGNOSIS — E785 Hyperlipidemia, unspecified: Secondary | ICD-10-CM | POA: Diagnosis present

## 2023-05-05 DIAGNOSIS — M25552 Pain in left hip: Secondary | ICD-10-CM

## 2023-05-05 DIAGNOSIS — I420 Dilated cardiomyopathy: Secondary | ICD-10-CM | POA: Diagnosis not present

## 2023-05-05 DIAGNOSIS — R54 Age-related physical debility: Secondary | ICD-10-CM | POA: Diagnosis present

## 2023-05-05 DIAGNOSIS — I480 Paroxysmal atrial fibrillation: Secondary | ICD-10-CM | POA: Diagnosis not present

## 2023-05-05 DIAGNOSIS — I2510 Atherosclerotic heart disease of native coronary artery without angina pectoris: Secondary | ICD-10-CM | POA: Diagnosis present

## 2023-05-05 LAB — GLUCOSE, CAPILLARY
Glucose-Capillary: 111 mg/dL — ABNORMAL HIGH (ref 70–99)
Glucose-Capillary: 158 mg/dL — ABNORMAL HIGH (ref 70–99)

## 2023-05-05 LAB — CBG MONITORING, ED: Glucose-Capillary: 173 mg/dL — ABNORMAL HIGH (ref 70–99)

## 2023-05-05 LAB — PHOSPHORUS: Phosphorus: 2.3 mg/dL — ABNORMAL LOW (ref 2.5–4.6)

## 2023-05-05 LAB — D-DIMER, QUANTITATIVE: D-Dimer, Quant: 3.48 ug/mL-FEU — ABNORMAL HIGH (ref 0.00–0.50)

## 2023-05-05 LAB — IRON AND TIBC
Iron: 39 ug/dL (ref 28–170)
Saturation Ratios: 21 % (ref 10.4–31.8)
TIBC: 190 ug/dL — ABNORMAL LOW (ref 250–450)
UIBC: 151 ug/dL

## 2023-05-05 LAB — HEPATITIS B SURFACE ANTIGEN: Hepatitis B Surface Ag: NONREACTIVE

## 2023-05-05 LAB — BRAIN NATRIURETIC PEPTIDE: B Natriuretic Peptide: 559.9 pg/mL — ABNORMAL HIGH (ref 0.0–100.0)

## 2023-05-05 LAB — FERRITIN: Ferritin: 1474 ng/mL — ABNORMAL HIGH (ref 11–307)

## 2023-05-05 LAB — TSH: TSH: 0.046 u[IU]/mL — ABNORMAL LOW (ref 0.350–4.500)

## 2023-05-05 MED ORDER — ACETAMINOPHEN 325 MG PO TABS
650.0000 mg | ORAL_TABLET | Freq: Four times a day (QID) | ORAL | Status: DC | PRN
  Administered 2023-05-05 – 2023-05-06 (×2): 650 mg via ORAL
  Filled 2023-05-05 (×2): qty 2

## 2023-05-05 MED ORDER — DILTIAZEM HCL-DEXTROSE 125-5 MG/125ML-% IV SOLN (PREMIX)
2.5000 mg/h | INTRAVENOUS | Status: DC
  Administered 2023-05-05: 5 mg/h via INTRAVENOUS
  Filled 2023-05-05: qty 125

## 2023-05-05 MED ORDER — INSULIN GLARGINE-YFGN 100 UNIT/ML ~~LOC~~ SOLN
10.0000 [IU] | Freq: Every day | SUBCUTANEOUS | Status: DC
  Administered 2023-05-05: 10 [IU] via SUBCUTANEOUS
  Filled 2023-05-05 (×2): qty 0.1

## 2023-05-05 MED ORDER — INSULIN ASPART 100 UNIT/ML IJ SOLN: 0.0000 [IU] | Freq: Every day | INTRAMUSCULAR | Status: DC

## 2023-05-05 MED ORDER — AMIODARONE HCL IN DEXTROSE 360-4.14 MG/200ML-% IV SOLN: 60.0000 mg/h | INTRAVENOUS | Status: DC

## 2023-05-05 MED ORDER — AMIODARONE HCL 200 MG PO TABS: 200.0000 mg | ORAL_TABLET | Freq: Every day | ORAL | Status: DC

## 2023-05-05 MED ORDER — AMIODARONE HCL 200 MG PO TABS
400.0000 mg | ORAL_TABLET | Freq: Two times a day (BID) | ORAL | Status: DC
  Administered 2023-05-05 – 2023-05-06 (×3): 400 mg via ORAL
  Filled 2023-05-05 (×3): qty 2

## 2023-05-05 MED ORDER — AMIODARONE LOAD VIA INFUSION
150.0000 mg | Freq: Once | INTRAVENOUS | Status: DC
  Filled 2023-05-05: qty 83.34

## 2023-05-05 MED ORDER — ENOXAPARIN SODIUM 30 MG/0.3ML IJ SOSY: 30.0000 mg | PREFILLED_SYRINGE | INTRAMUSCULAR | Status: DC

## 2023-05-05 MED ORDER — INSULIN ASPART 100 UNIT/ML IJ SOLN
0.0000 [IU] | Freq: Three times a day (TID) | INTRAMUSCULAR | Status: DC
  Administered 2023-05-05 – 2023-05-06 (×2): 1 [IU] via SUBCUTANEOUS
  Administered 2023-05-06: 2 [IU] via SUBCUTANEOUS

## 2023-05-05 MED ORDER — APIXABAN 2.5 MG PO TABS
2.5000 mg | ORAL_TABLET | Freq: Two times a day (BID) | ORAL | Status: DC
  Administered 2023-05-05 – 2023-05-06 (×3): 2.5 mg via ORAL
  Filled 2023-05-05 (×3): qty 1

## 2023-05-05 MED ORDER — AMIODARONE HCL IN DEXTROSE 360-4.14 MG/200ML-% IV SOLN
30.0000 mg/h | INTRAVENOUS | Status: DC
  Filled 2023-05-05: qty 200

## 2023-05-05 MED ORDER — ACETAMINOPHEN 325 MG PO TABS
ORAL_TABLET | ORAL | Status: AC
  Administered 2023-05-05: 650 mg
  Filled 2023-05-05: qty 2

## 2023-05-05 MED ORDER — SENNOSIDES-DOCUSATE SODIUM 8.6-50 MG PO TABS: 1.0000 | ORAL_TABLET | Freq: Every evening | ORAL | Status: DC | PRN

## 2023-05-05 MED ORDER — IOHEXOL 350 MG/ML SOLN
75.0000 mL | Freq: Once | INTRAVENOUS | Status: AC | PRN
  Administered 2023-05-05: 75 mL via INTRAVENOUS

## 2023-05-05 NOTE — Progress Notes (Signed)
PT admitted for A.fib with RVR post dialysis. Pt A&OX4. BP (!) 139/55   Pulse 77   Temp 97.8 F (36.6 C) (Oral)   Resp 14   Ht 5\' 2"  (1.575 m)   Wt 68 kg   SpO2 100%   BMI 27.42 kg/m  No c/o pain at this time. Pt on tele NSR no skin issues. 2/4 rails up call bell near by, bed alarm on, bed locked and in lowest position. Pt and family oriented to floor and no other needs voiced at this time. Reva Bores 05/05/23 2:42 PM

## 2023-05-05 NOTE — Consult Note (Signed)
Reason for Consult: ESRD Referring Physician:  Dr. Bebe Shaggy   Chief Complaint: ESRD  Assessment/Plan: ESRD - s/p HD on 7/5 overnight completing early 7/6. - Next HD on Mon on MWF regimen; no absolute indication for HD today.  Additional recommendations - Dose all meds for creatinine clearance < 10 ml/min  - Unless absolutely necessary, no MRIs with gadolinium.  - Prefer needle sticks in the dorsum of the hands or wrists.  No blood pressure measurements in right arm. - If blood transfusion is requested during hemodialysis sessions, please alert [place] prior to the session.   Avoid nephrotoxic medications including NSAIDs and iodinated intravenous contrast exposure unless the latter is absolutely indicated.  Preferred narcotic agents for pain control are hydromorphone, fentanyl, and methadone. Morphine should not be used. Avoid Baclofen and avoid oral sodium phosphate and magnesium citrate based laxatives / bowel preps. Continue strict Input and Output monitoring. 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 Atrial fibrillation new onset - w/u per primary, but occurred after dialysis completion early 7/6. H/o CASHD - no chest pain or dyspnea. CHF last TTE in [facility] was on 11/20/19 with EF 55%   HPI: Elizabeth Olsen is an 82 y.o. 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 The family has been arranging a move to [facility] and expected to start dialysis at the outpatient clinic but unfortunately the local clinic did not have the paperwork to admit the patient so she was referred to the ED on 7/5. Her last dialysis was 05/01/23. She has also started training for PD. The hope is that she can have family assist her with PD so she can split time between [place] and [place]. Her  daughter who accompanies her today, lives in [place] and she has a son that lives locally. They have not completed PD training, she has had some issues and need PD cath revision surgery. Her daughter does mention that she does need to have the PD cath flushed; last flush was ~5 days ago. She received HD overnight and after completion of HD ~3AM she developed new onset atrial fibrillation.    Seen and examined in the ED. Blood pressure elevated but no signs of distress.  ROS Pertinent items are noted in HPI.  Chemistry and CBC: Creatinine  Date/Time Value Ref Range Status  02/10/2020 09:25 AM 2.00 (H) 0.44 - 1.00 mg/dL Final  16/07/9603 54:09 AM 1.79 (H) 0.44 - 1.00 mg/dL Final  81/19/1478 29:56 AM 1.31 (H) 0.44 - 1.00 mg/dL Final  21/30/8657 84:69 AM 1.60 (H) 0.44 - 1.00 mg/dL Final  62/95/2841 32:44 AM 1.30 (H) 0.60 - 1.10 mg/dL Final  11/01/7251 66:44 PM 1.53 (H) 0.70 - 1.30 mg/dL Final   Creatinine, Ser  Date/Time Value Ref Range Status  05/04/2023 12:29 PM 4.34 (H) 0.44 - 1.00 mg/dL Final  03/47/4259 56:38 AM 2.41 (H) 0.57 - 1.00 mg/dL Final  75/64/3329 51:88 AM 2.46 (H) 0.57 - 1.00 mg/dL Final  41/66/0630 16:01 AM 2.69 (H) 0.57 - 1.00 mg/dL Final  09/32/3557 32:20 PM 2.35 (H) 0.57 - 1.00 mg/dL Final  25/42/7062 37:62 AM 2.00 (H) 0.44 - 1.00 mg/dL Final  83/15/1761 60:73 AM 2.39 (H) 0.57 - 1.00 mg/dL Final  71/03/2693 85:46 PM 1.94 (H) 0.57 -  1.00 mg/dL Final  81/19/1478 29:56 AM 3.18 (H) 0.44 - 1.00 mg/dL Final  21/30/8657 84:69 AM 3.18 (H) 0.44 - 1.00 mg/dL Final  62/95/2841 32:44 AM 3.51 (H) 0.44 - 1.00 mg/dL Final  11/01/7251 66:44 AM 3.36 (H) 0.44 - 1.00 mg/dL Final  03/47/4259 56:38 AM 2.89 (H) 0.44 - 1.00 mg/dL Final  75/64/3329 51:88 AM 2.47 (H) 0.44 - 1.00 mg/dL Final  41/66/0630 16:01 AM 2.19 (H) 0.44 - 1.00 mg/dL Final  09/32/3557 32:20 AM 1.66 (H) 0.44 - 1.00 mg/dL Final  25/42/7062 37:62 AM 1.73 (H) 0.44 - 1.00 mg/dL Final  83/15/1761 60:73 AM 1.75 (H) 0.57 - 1.00  mg/dL Final  71/03/2693 85:46 PM 1.23 (H) 0.44 - 1.00 mg/dL Final  27/12/5007 38:18 PM 1.44 (H) 0.57 - 1.00 mg/dL Final  29/93/7169 67:89 AM 1.94 (H) 0.57 - 1.00 mg/dL Final  38/07/1750 02:58 PM 1.30 (H) 0.44 - 1.00 mg/dL Final  52/77/8242 35:36 PM 1.36 (H) 0.44 - 1.00 mg/dL Final  14/43/1540 08:67 PM 1.26 (H) 0.44 - 1.00 mg/dL Final  61/95/0932 67:12 PM 1.34 (H) 0.44 - 1.00 mg/dL Final  45/80/9983 38:25 PM 1.45 (H) 0.44 - 1.00 mg/dL Final  05/39/7673 41:93 PM 1.19 (H) 0.57 - 1.00 mg/dL Final  79/12/4095 35:32 PM 1.37 (H) 0.57 - 1.00 mg/dL Final  99/24/2683 41:96 AM 1.49 (H) 0.44 - 1.00 mg/dL Final  22/29/7989 21:19 AM 1.10 (H) 0.44 - 1.00 mg/dL Final  41/74/0814 48:18 AM 1.09 (H) 0.57 - 1.00 mg/dL Final  56/31/4970 26:37 PM 0.93 0.57 - 1.00 mg/dL Final  85/88/5027 74:12 PM 1.19 (H) 0.57 - 1.00 mg/dL Final  87/86/7672 09:47 AM 0.94 0.44 - 1.00 mg/dL Final  09/62/8366 29:47 PM 0.69 0.50 - 1.10 mg/dL Final  65/46/5035 46:56 PM 0.70 0.50 - 1.10 mg/dL Final  81/27/5170 01:74 PM 0.51 0.50 - 1.10 mg/dL Final   Recent Labs  Lab 05/04/23 1229  NA 132*  K 4.8  CL 96*  CO2 26  GLUCOSE 146*  BUN 60*  CREATININE 4.34*  CALCIUM 8.3*   Recent Labs  Lab 05/04/23 1229  WBC 7.7  NEUTROABS 5.0  HGB 8.9*  HCT 29.3*  MCV 98.3  PLT 257   Liver Function Tests: Recent Labs  Lab 05/04/23 1229  AST 16  ALT 9  ALKPHOS 75  BILITOT 0.4  PROT 6.4*  ALBUMIN 3.3*   No results for input(s): "LIPASE", "AMYLASE" in the last 168 hours. No results for input(s): "AMMONIA" in the last 168 hours. Cardiac Enzymes: No results for input(s): "CKTOTAL", "CKMB", "CKMBINDEX", "TROPONINI" in the last 168 hours. Iron Studies: No results for input(s): "IRON", "TIBC", "TRANSFERRIN", "FERRITIN" in the last 72 hours. PT/INR: @LABRCNTIP (inr:5)  Xrays/Other Studies: ) Results for orders placed or performed during the hospital encounter of 05/04/23 (from the past 48 hour(s))  CBC with Differential      Status: Abnormal   Collection Time: 05/04/23 12:29 PM  Result Value Ref Range   WBC 7.7 4.0 - 10.5 K/uL   RBC 2.98 (L) 3.87 - 5.11 MIL/uL   Hemoglobin 8.9 (L) 12.0 - 15.0 g/dL   HCT 94.4 (L) 96.7 - 59.1 %   MCV 98.3 80.0 - 100.0 fL   MCH 29.9 26.0 - 34.0 pg   MCHC 30.4 30.0 - 36.0 g/dL   RDW 63.8 46.6 - 59.9 %   Platelets 257 150 - 400 K/uL   nRBC 0.0 0.0 - 0.2 %   Neutrophils Relative % 65 %   Neutro Abs 5.0 1.7 -  7.7 K/uL   Lymphocytes Relative 15 %   Lymphs Abs 1.1 0.7 - 4.0 K/uL   Monocytes Relative 15 %   Monocytes Absolute 1.2 (H) 0.1 - 1.0 K/uL   Eosinophils Relative 4 %   Eosinophils Absolute 0.3 0.0 - 0.5 K/uL   Basophils Relative 1 %   Basophils Absolute 0.0 0.0 - 0.1 K/uL   Immature Granulocytes 0 %   Abs Immature Granulocytes 0.03 0.00 - 0.07 K/uL    Comment: Performed at [facility], [address], [place], [place] [address]  Comprehensive metabolic panel     Status: Abnormal   Collection Time: 05/04/23 12:29 PM  Result Value Ref Range   Sodium 132 (L) 135 - 145 mmol/L   Potassium 4.8 3.5 - 5.1 mmol/L   Chloride 96 (L) 98 - 111 mmol/L   CO2 26 22 - 32 mmol/L   Glucose, Bld 146 (H) 70 - 99 mg/dL    Comment: Glucose reference range applies only to samples taken after fasting for at least 8 hours.   BUN 60 (H) 8 - 23 mg/dL   Creatinine, Ser 0.98 (H) 0.44 - 1.00 mg/dL   Calcium 8.3 (L) 8.9 - 10.3 mg/dL   Total Protein 6.4 (L) 6.5 - 8.1 g/dL   Albumin 3.3 (L) 3.5 - 5.0 g/dL   AST 16 15 - 41 U/L   ALT 9 0 - 44 U/L   Alkaline Phosphatase 75 38 - 126 U/L   Total Bilirubin 0.4 0.3 - 1.2 mg/dL   GFR, Estimated 10 (L) >60 mL/min    Comment: (NOTE) Calculated using the CKD-EPI Creatinine Equation (2021)    Anion gap 10 5 - 15    Comment: Performed at [facility], [address] [place], [place] [address]  Hepatitis B surface antigen     Status: None   Collection Time: 05/04/23 11:45 PM  Result Value Ref Range   Hepatitis B Surface Ag NON REACTIVE  NON REACTIVE    Comment: Performed at [facility], [address] [place], [place] [address]   No results found.  PMH:   Past Medical History:  Diagnosis Date   Acquired hypothyroidism 10/13/2017   Acute diastolic CHF (congestive heart failure) (HCC) 11/20/2019   Acute respiratory failure with hypoxia (HCC) 10/13/2017   Acute systolic heart failure (HCC) 10/13/2017   Anemia    Anemia associated with diabetes mellitus (HCC) 10/13/2017   Anemia of chronic disease 12/10/2017   Blood transfusion    Blood transfusion without reported diagnosis    with breast surgery   Breast cancer (HCC) 15 years ago   left    Cardiomyopathy (HCC) 07/23/2017   Ejection fraction 45-50% may; from September 2018   CHF (congestive heart failure) (HCC)    Chronic cholecystitis with calculus 01/22/2018   Colon polyp 03/2007   adenomatous   COVID-19 virus infection 11/20/2019   Diabetes mellitus    dx 1998.  was told prior to getting chemo that her bld sugar rose.  She thought it would go back   Dyspnea    d/t anemia   Essential hypertension 09/11/2018   GERD (gastroesophageal reflux disease)    Heart murmur    as child   Hernia, incisional    Hyperlipidemia    Hypertension    Hypothyroidism    Iron deficiency 07/23/2017   Near syncope 04/01/2018   Refractory anemia, unspecified (HCC) 12/10/2017   Type 1 diabetes (HCC) 07/23/2017   Vertigo  06/24/2018   Vitamin D deficiency     PSH:   Past Surgical History:  Procedure Laterality Date   ABDOMINAL HYSTERECTOMY     APPENDECTOMY     BREAST SURGERY     CHOLECYSTECTOMY  01/22/2018   LAPROSCOPIC    CHOLECYSTECTOMY N/A 01/22/2018   Procedure: LAPAROSCOPIC CHOLECYSTECTOMY WITH INTRAOPERATIVE CHOLANGIOGRAM;  Surgeon: Manus Rudd, MD;  Location: [facility] OR;  Service: General;  Laterality: N/A;   COLONOSCOPY  2013   due next 03-2017   EYE SURGERY     bil cataracts   IUD REMOVAL     with appendectomy   MASTECTOMY, MODIFIED RADICAL W/RECONSTRUCTION Left  15 years ago   10 nodes out   TUMOR EXCISION Left    x 2, neck, head    Allergies:  Allergies  Allergen Reactions   Erythromycin Shortness Of Breath and Swelling    "throat swelling", sob, thrashing ,    Penicillins Rash   Nyquil [Pseudoeph-Doxylamine-Dm-Apap] Rash   Sulfa Antibiotics Rash    Medications:   Prior to Admission medications   Medication Sig Start Date End Date Taking? Authorizing Provider  cloNIDine (CATAPRES) 0.1 MG tablet Take 0.1 mg by mouth daily as needed (FOR SYSTOLIC BLOOD PRESSURE GREATER THAN 170 AND OR DIASTOLIC FOR BLOOD PRESSURE GREATER 100). 05/03/21  Yes [provider]  amLODipine (NORVASC) 5 MG tablet Take 5 mg by mouth every morning. Patient not taking: Reported on 05/05/2023 04/30/20   [provider]  ASPIRIN ADULT LOW STRENGTH 81 MG EC tablet Take 81 mg by mouth every morning. 12/26/19   [provider]  atorvastatin (LIPITOR) 10 MG tablet TAKE 1 TABLET BY MOUTH EVERY DAY ONE TIME ONLY 05/19/20   Georgeanna Lea, MD  calcium carbonate (OSCAL) 1500 (600 Ca) MG TABS tablet Take 600 mg by mouth daily with breakfast.     [provider]  carvedilol (COREG) 6.25 MG tablet TAKE 1 TABLET BY MOUTH WITH BREAKFAST & EVENING MEAL EVERY DAY Patient not taking: Reported on 05/05/2023 05/19/20   Georgeanna Lea, MD  Continuous Blood Gluc Sensor (FREESTYLE LIBRE 14 DAY SENSOR) MISC See admin instructions. 10/22/19   [provider]  fenofibrate 160 MG tablet Take 160 mg by mouth daily.    [provider]  fish oil-omega-3 fatty acids 1000 MG capsule Take 1 g by mouth daily.     [provider]  furosemide (LASIX) 20 MG tablet TAKE 2 TABLETS BY MOUTH EVERY DAY 08/09/20   Georgeanna Lea, MD  furosemide (LASIX) 40 MG tablet Take by mouth 2 (two) times daily. 05/07/20   [provider]  HUMALOG 100 UNIT/ML injection USE as directed via insulin pump. max total daily DOSE of 50 UNITS 02/12/20    [provider]  hydrALAZINE (APRESOLINE) 25 MG tablet Take 1 tablet (25 mg total) by mouth 3 (three) times daily. Patient not taking: Reported on 05/05/2023 05/19/20 08/17/20  Georgeanna Lea, MD  Insulin Disposable Pump (OMNIPOD DASH 5 PACK PODS) MISC SMARTSIG:SUB-Q Every 3 Days 05/11/20   [provider]  insulin glargine (LANTUS SOLOSTAR) 100 UNIT/ML Solostar Pen Inject 7 Units into the skin in the morning. And 6 units at night 01/26/20   [provider]  iron polysaccharides (NIFEREX) 150 MG capsule Take 150 mg by mouth 2 (two) times daily.     [provider]  levothyroxine (SYNTHROID) 112 MCG tablet Take one tablet 5 days of the week and HOLD 2 days of the week.  01/27/20   [provider]  ONETOUCH VERIO test strip USE TO TEST UP TO 10 TIMES D 08/24/17   [provider]  pantoprazole (PROTONIX) 40 MG tablet Take 1 tablet (40 mg total) by mouth 2 (two) times daily. 11/27/19   Kathlen Mody, MD  vitamin C (ASCORBIC ACID) 500 MG tablet Take 500 mg by mouth daily.    [provider]  Vitamin D, Ergocalciferol, (DRISDOL) 50000 UNITS CAPS Take 50,000 Units by mouth 2 (two) times a week. Sundays & Wednesdays. 02/20/12   [provider]  zinc sulfate 220 (50 Zn) MG capsule Take 1 capsule (220 mg total) by mouth daily. 11/28/19   Kathlen Mody, MD    Discontinued Meds:   Medications Discontinued During This Encounter  Medication Reason   pentafluoroprop-tetrafluoroeth (GEBAUERS) aerosol 1 Application Patient Transfer   lidocaine (PF) (XYLOCAINE) 1 % injection 5 mL Patient Transfer   heparin injection 1,000 Units Patient Transfer   anticoagulant sodium citrate solution 5 mL Patient Transfer   alteplase (CATHFLO ACTIVASE) injection 2 mg Patient Transfer   heparin injection 1,400 Units Patient Transfer    Social History:  reports that she has never smoked. She has never used smokeless tobacco. She reports that she does not drink  alcohol and does not use drugs.  Family History:   Family History  Problem Relation Age of Onset   Diabetes Other        both sides of family   Hypertension Father    Congestive Heart Failure Father    Peripheral vascular disease Father    Hypertension Maternal Grandfather    Hypertension Maternal Grandmother    Stomach cancer Maternal Grandmother        GGM   Heart attack Brother    Colon cancer Neg Hx    Esophageal cancer Neg Hx    Pancreatic cancer Neg Hx    Rectal cancer Neg Hx     Blood pressure 117/66, pulse (!) 122, temperature 98 F (36.7 C), temperature source Oral, resp. rate 17, height 5\' 2"  (1.575 m), weight 68 kg, SpO2 100 %. General appearance: alert, cooperative, and appears stated age Head: Normocephalic, without obvious abnormality, atraumatic Eyes: negative Neck: no adenopathy, no carotid bruit, no JVD, supple, symmetrical, trachea midline, and thyroid not enlarged, symmetric, no tenderness/mass/nodules Back: symmetric, no curvature. ROM normal. No CVA tenderness. Resp: clear to auscultation bilaterally Cardio: irregularly irregular rhythm GI: soft, non-tender; bowel sounds normal; no masses,  no organomegaly Extremities: extremities normal, atraumatic, no cyanosis or edema Pulses: 2+ and symmetric Skin: Skin color, texture, turgor normal. No rashes or lesions Access: Rt FAL (fistula?)       Claire Dolores, Len Blalock, MD 05/05/2023, 8:27 AM

## 2023-05-05 NOTE — ED Notes (Signed)
ED TO INPATIENT HANDOFF REPORT  ED Nurse Name and Phone #: Dorethea Clan  S Name/Age/Gender Elizabeth Olsen 82 y.o. female Room/Bed: 011C/011C  Code Status   Code Status: Full Code  Home/SNF/Other Home Patient oriented to: self, place, time, and situation Is this baseline? Yes   Triage Complete: Triage complete  Chief Complaint ESRD (end stage renal disease) (HCC) [N18.6] Atrial fibrillation with RVR (HCC) [I48.91]  Triage Note Pt to the ed form home needing dialysis. Pt is new to the area and thought she had a dialysis appt set up today, but was unable to get scheduled at dialysis center. Pt has no complainants at this time.     Allergies Allergies  Allergen Reactions   Erythromycin Shortness Of Breath and Swelling    "throat swelling", sob, thrashing ,    Penicillins Rash   Nyquil [Pseudoeph-Doxylamine-Dm-Apap] Rash   Sulfa Antibiotics Rash    Level of Care/Admitting Diagnosis ED Disposition     ED Disposition  Admit   Condition  --   Comment  Hospital Area: [facility] [[id number]]  Level of Care: Progressive [102]  Admit to Progressive based on following criteria: CARDIOVASCULAR & THORACIC of moderate stability with acute coronary syndrome symptoms/low risk myocardial infarction/hypertensive urgency/arrhythmias/heart failure potentially compromising stability and stable post cardiovascular intervention patients.  May admit patient to [facility] or [facility] if equivalent level of care is available:: No  Covid Evaluation: Asymptomatic - no recent exposure (last 10 days) testing not required  Diagnosis: Atrial fibrillation with RVR [facility]) [[id number]]  Admitting Physician: Tyson Alias [[id number]]  Attending Physician: Tyson Alias [[id number]]  Certification:: I certify this patient will need inpatient services for at least 2 midnights  Estimated Length of Stay: 2          B Medical/Surgery History Past Medical  History:  Diagnosis Date   Acquired hypothyroidism 10/13/2017   Acute diastolic CHF (congestive heart failure) (HCC) 11/20/2019   Acute respiratory failure with hypoxia (HCC) 10/13/2017   Acute systolic heart failure (HCC) 10/13/2017   Anemia    Anemia associated with diabetes mellitus (HCC) 10/13/2017   Anemia of chronic disease 12/10/2017   Blood transfusion    Blood transfusion without reported diagnosis    with breast surgery   Breast cancer (HCC) 15 years ago   left    Cardiomyopathy (HCC) 07/23/2017   Ejection fraction 45-50% may; from September 2018   CHF (congestive heart failure) (HCC)    Chronic cholecystitis with calculus 01/22/2018   Colon polyp 03/2007   adenomatous   COVID-19 virus infection 11/20/2019   Diabetes mellitus    dx 1998.  was told prior to getting chemo that her bld sugar rose.  She thought it would go back   Dyspnea    d/t anemia   Essential hypertension 09/11/2018   GERD (gastroesophageal reflux disease)    Heart murmur    as child   Hernia, incisional    Hyperlipidemia    Hypertension    Hypothyroidism    Iron deficiency 07/23/2017   Near syncope 04/01/2018   Refractory anemia, unspecified (HCC) 12/10/2017   Type 1 diabetes (HCC) 07/23/2017   Vertigo 06/24/2018   Vitamin D deficiency    Past Surgical History:  Procedure Laterality Date   ABDOMINAL HYSTERECTOMY     APPENDECTOMY     BREAST SURGERY     CHOLECYSTECTOMY  01/22/2018   LAPROSCOPIC    CHOLECYSTECTOMY N/A 01/22/2018   Procedure: LAPAROSCOPIC CHOLECYSTECTOMY WITH INTRAOPERATIVE  CHOLANGIOGRAM;  Surgeon: Manus Rudd, MD;  Location: [facility] OR;  Service: General;  Laterality: N/A;   COLONOSCOPY  2013   due next 03-2017   EYE SURGERY     bil cataracts   IUD REMOVAL     with appendectomy   MASTECTOMY, MODIFIED RADICAL W/RECONSTRUCTION Left 15 years ago   10 nodes out   TUMOR EXCISION Left    x 2, neck, head     A IV Location/Drains/Wounds Patient Lines/Drains/Airways Status     Active  Line/Drains/Airways     Name Placement date Placement time Site Days   Peripheral IV 05/04/23 22 G Anterior;Left;Proximal Forearm 05/04/23  1258  Forearm  1   Fistula / Graft Right Forearm Arteriovenous fistula 02/09/21  --  Forearm  815   Incision 03/19/13 Hand Right 03/19/13  0954  -- 3699   Incision (Closed) 01/22/18 Abdomen 01/22/18  0748  -- 1929   Incision - 4 Ports Abdomen 1: Umbilicus 2: Upper;Medial 3: Right;Medial 4: Right;Lateral 01/22/18  0821  -- 1929            Intake/Output Last 24 hours  Intake/Output Summary (Last 24 hours) at 05/05/2023 1250 Last data filed at 05/05/2023 0359 Gross per 24 hour  Intake --  Output 2200 ml  Net -2200 ml    Labs/Imaging Results for orders placed or performed during the hospital encounter of 05/04/23 (from the past 48 hour(s))  CBC with Differential     Status: Abnormal   Collection Time: 05/04/23 12:29 PM  Result Value Ref Range   WBC 7.7 4.0 - 10.5 K/uL   RBC 2.98 (L) 3.87 - 5.11 MIL/uL   Hemoglobin 8.9 (L) 12.0 - 15.0 g/dL   HCT 29.5 (L) 62.1 - 30.8 %   MCV 98.3 80.0 - 100.0 fL   MCH 29.9 26.0 - 34.0 pg   MCHC 30.4 30.0 - 36.0 g/dL   RDW 65.7 84.6 - 96.2 %   Platelets 257 150 - 400 K/uL   nRBC 0.0 0.0 - 0.2 %   Neutrophils Relative % 65 %   Neutro Abs 5.0 1.7 - 7.7 K/uL   Lymphocytes Relative 15 %   Lymphs Abs 1.1 0.7 - 4.0 K/uL   Monocytes Relative 15 %   Monocytes Absolute 1.2 (H) 0.1 - 1.0 K/uL   Eosinophils Relative 4 %   Eosinophils Absolute 0.3 0.0 - 0.5 K/uL   Basophils Relative 1 %   Basophils Absolute 0.0 0.0 - 0.1 K/uL   Immature Granulocytes 0 %   Abs Immature Granulocytes 0.03 0.00 - 0.07 K/uL    Comment: Performed at [facility], [address] [place], [place] [address]  Comprehensive metabolic panel     Status: Abnormal   Collection Time: 05/04/23 12:29 PM  Result Value Ref Range   Sodium 132 (L) 135 - 145 mmol/L   Potassium 4.8 3.5 - 5.1 mmol/L   Chloride 96 (L) 98 - 111 mmol/L   CO2 26 22  - 32 mmol/L   Glucose, Bld 146 (H) 70 - 99 mg/dL    Comment: Glucose reference range applies only to samples taken after fasting for at least 8 hours.   BUN 60 (H) 8 - 23 mg/dL   Creatinine, Ser 1.32 (H) 0.44 - 1.00 mg/dL   Calcium 8.3 (L) 8.9 - 10.3 mg/dL   Total Protein 6.4 (L) 6.5 - 8.1 g/dL   Albumin 3.3 (L) 3.5 - 5.0 g/dL   AST 16 15 - 41 U/L  ALT 9 0 - 44 U/L   Alkaline Phosphatase 75 38 - 126 U/L   Total Bilirubin 0.4 0.3 - 1.2 mg/dL   GFR, Estimated 10 (L) >60 mL/min    Comment: (NOTE) Calculated using the CKD-EPI Creatinine Equation (2021)    Anion gap 10 5 - 15    Comment: Performed at [facility], [address] [place], [place] [address]  Hepatitis B surface antigen     Status: None   Collection Time: 05/04/23 11:45 PM  Result Value Ref Range   Hepatitis B Surface Ag NON REACTIVE NON REACTIVE    Comment: Performed at [facility], [address], [place], [place] [address]  TSH     Status: Abnormal   Collection Time: 05/05/23  8:32 AM  Result Value Ref Range   TSH 0.046 (L) 0.350 - 4.500 uIU/mL    Comment: Performed by a 3rd Generation assay with a functional sensitivity of <=0.01 uIU/mL. Performed at [facility], [address], [place], [place] [address]   Brain natriuretic peptide     Status: Abnormal   Collection Time: 05/05/23  8:32 AM  Result Value Ref Range   B Natriuretic Peptide 559.9 (H) 0.0 - 100.0 pg/mL    Comment: Performed at [facility], [address] [place], [place] [address]  D-dimer, quantitative     Status: Abnormal   Collection Time: 05/05/23  9:00 AM  Result Value Ref Range   D-Dimer, Quant 3.48 (H) 0.00 - 0.50 ug/mL-FEU    Comment: (NOTE) At the manufacturer cut-off value of 0.5 g/mL FEU, this assay has a negative predictive value of 95-100%.This assay is intended for use in conjunction with a clinical pretest probability (PTP) assessment model to exclude pulmonary embolism (PE) and deep venous  thrombosis (DVT) in outpatients suspected of PE or DVT. Results should be correlated with clinical presentation. Performed at [facility], [address], [place], [place] [address]   Phosphorus     Status: Abnormal   Collection Time: 05/05/23  9:00 AM  Result Value Ref Range   Phosphorus 2.3 (L) 2.5 - 4.6 mg/dL    Comment: Performed at [facility], [address], [place], [place] [address]  Ferritin     Status: Abnormal   Collection Time: 05/05/23  9:00 AM  Result Value Ref Range   Ferritin 1,474 (H) 11 - 307 ng/mL    Comment: Performed at [facility], [address], [place], [place] [address]  Iron and TIBC     Status: Abnormal   Collection Time: 05/05/23  9:00 AM  Result Value Ref Range   Iron 39 28 - 170 ug/dL   TIBC 253 (L) 664 - 403 ug/dL   Saturation Ratios 21 10.4 - 31.8 %   UIBC 151 ug/dL    Comment: Performed at [facility], [address], [place], [place] [address]  CBG monitoring, ED     Status: Abnormal   Collection Time: 05/05/23 12:11 PM  Result Value Ref Range   Glucose-Capillary 173 (H) 70 - 99 mg/dL    Comment: Glucose reference range applies only to samples taken after fasting for at least 8 hours.   DG HIP UNILAT WITH PELVIS 2-3 VIEWS LEFT  Result Date: 05/05/2023 CLINICAL DATA:  Hip pain. EXAM: DG HIP (WITH OR WITHOUT PELVIS) 2-3V LEFT COMPARISON:  None Available. FINDINGS: No fracture. No bone lesion. Skeletal structures are demineralized. Mild to  moderate medial left hip joint space narrowing. Minor marginal osteophytes from the inferior base of the left femoral head. Right hip joint, SI joints and pubic symphysis are normally spaced and aligned. Surgical vascular clips noted in the left abdomen and pelvis. Soft tissues are otherwise unremarkable. IMPRESSION: 1. No fracture or acute finding. 2. Mild left hip joint arthropathic changes with medial hip joint space narrowing and minor marginal osteophyte formation from the base of the  inferior left femoral head. Electronically Signed   By: Amie Portland M.D.   On: 05/05/2023 10:46   DG HIP UNILAT WITH PELVIS 2-3 VIEWS RIGHT  Result Date: 05/05/2023 CLINICAL DATA:  Pain EXAM: DG HIP (WITH OR WITHOUT PELVIS) 2-3V RIGHT COMPARISON:  04/12/2013 FINDINGS: There is no evidence of hip fracture or dislocation. Mild right hip joint space narrowing. Atherosclerotic vascular calcification. IMPRESSION: Mild right hip osteoarthritis. No acute findings. Electronically Signed   By: Duanne Guess D.O.   On: 05/05/2023 10:45   Portable chest 1 View  Result Date: 05/05/2023 CLINICAL DATA:  Atrial fibrillation with rapid ventricular response. EXAM: PORTABLE CHEST 1 VIEW COMPARISON:  11/21/2019. FINDINGS: Mild enlargement of the cardiac silhouette. No mediastinal or hilar masses. Lungs are hyperexpanded with prominence of the interstitial markings, stable. No lung consolidation or convincing edema. No pleural effusion or pneumothorax. Stable changes from prior left breast surgery. Skeletal structures are grossly intact. IMPRESSION: 1. No acute cardiopulmonary disease. Electronically Signed   By: Amie Portland M.D.   On: 05/05/2023 10:02    Pending Labs Unresulted Labs (From admission, onward)     Start     Ordered   05/06/23 0500  Renal function panel  Tomorrow morning,   R        05/05/23 0834   05/06/23 0500  CBC  Tomorrow morning,   R        05/05/23 0834   05/05/23 0837  Parathyroid hormone, intact (no Ca)  Once,   R        05/05/23 0836   05/04/23 1442  Hepatitis B surface antibody,quantitative  (New Admission Hemo Labs (Hepatitis B))  Once,   URGENT        05/04/23 1443            Vitals/Pain Today's Vitals   05/05/23 0830 05/05/23 0845 05/05/23 1200 05/05/23 1201  BP: 103/60 (!) 114/56 (!) 139/55   Pulse: 100 (!) 101 77   Resp: (!) 27 12 14    Temp:    97.8 F (36.6 C)  TempSrc:    Oral  SpO2: 98% 97% 100%   Weight:      Height:      PainSc:        Isolation  Precautions No active isolations  Medications Medications  Chlorhexidine Gluconate Cloth 2 % PADS 6 each (6 each Topical Not Given 05/05/23 0618)  acetaminophen (TYLENOL) tablet 650 mg (has no administration in time range)  senna-docusate (Senokot-S) tablet 1 tablet (has no administration in time range)  apixaban (ELIQUIS) tablet 2.5 mg (2.5 mg Oral Given 05/05/23 1154)  insulin aspart (novoLOG) injection 0-6 Units (0 Units Subcutaneous Hold 05/05/23 1233)  insulin aspart (novoLOG) injection 0-5 Units (has no administration in time range)  amiodarone (PACERONE) tablet 400 mg (400 mg Oral Given 05/05/23 1154)  amiodarone (PACERONE) tablet 200 mg (has no administration in time range)  insulin glargine-yfgn (SEMGLEE) injection 10 Units (10 Units Subcutaneous Given 05/05/23 1233)  acetaminophen (TYLENOL) 325 MG tablet (650 mg  Given 05/05/23  1610)    Mobility walks     Focused Assessments Cardiac Assessment Handoff:    Lab Results  Component Value Date   CKTOTAL 345 (H) 11/20/2019   CKMB 6.1 (H) 11/20/2019   TROPONINI <0.03 04/13/2018   Lab Results  Component Value Date   DDIMER 3.48 (H) 05/05/2023   Does the Patient currently have chest pain? No   , Renal Assessment Handoff:  Hemodialysis Schedule: Hemodialysis Schedule: Tuesday/Thursday/Saturday Last Hemodialysis date and time:    Restricted appendage: right arm   R Recommendations: See Admitting Provider Note  Report given to:   Additional Notes: pt manages sugar at home with insulin pump daughter is concerned about pt not being on insulin pump. Pt has a ct angio  order but I was unable to get a 20 above the wrist IV team consult is in.

## 2023-05-05 NOTE — H&P (Signed)
Date: 05/05/2023               Patient Name:  Elizabeth Olsen MRN: [id number]  DOB: 1941-03-26 Age / Sex: 82 y.o., female   PCP: [facility]         Medical Service: Internal Medicine Teaching Service         Attending Physician: : Dr. Oswaldo Done    First Contact: Faith Rogue      Pager: [phone]       Second Contact: Morene Crocker, MD      Pager: [facility] [phone]           After Hours (After 5p/  First Contact Pager: [phone]  weekends / holidays): Second Contact Pager: [phone]   SUBJECTIVE   Chief Complaint: New onset atrial fibrillation  History of Present Illness: Elizabeth Olsen is a 82 year old female with a past medical history of  ESRD, CAD, T1DM, HFrEF, and hypothyroidism who orrignally presented to the emergency department on 7/5 for dialysis after the outpatient dialysis center did not have her information for her to receive dialysis.  She recently relocated back to [facility] after living in [place] where she was receiving dialysis Tuesday, Thursday, Saturday for the past 3 years.  She was asymptotic when she presented to the ED on 7/5. Nephrology saw Elizabeth Olsen and she received dialysis over night where they removed 2.2L of fluid. After dialysis, the emergency department noticed that she was in atrial fibrillation with rates in the 120's once she returned from dialysis.   This morning, she is asymptotic and denied chest pain, SOB, pl chest pain, dizziness, and palpitations. She denies prior atrial fibrillation, pulmonary lung disease such as COPD or asthma, rheumatic heart disease, recent changes in thyroid medication or alcohol use.   Of note, she recently relocated to [facility] as stated above and had a PD catheter revision within this past month.  ED Course:DILT DRIP  Meds:  Current Meds  Medication Sig   cloNIDine (CATAPRES) 0.1 MG tablet Take 0.1 mg by mouth daily as needed (FOR SYSTOLIC BLOOD PRESSURE GREATER THAN 170 AND OR  DIASTOLIC FOR BLOOD PRESSURE GREATER 100).    Past Medical History  Past Surgical History:  Procedure Laterality Date   ABDOMINAL HYSTERECTOMY     APPENDECTOMY     BREAST SURGERY     CHOLECYSTECTOMY  01/22/2018   LAPROSCOPIC    CHOLECYSTECTOMY N/A 01/22/2018   Procedure: LAPAROSCOPIC CHOLECYSTECTOMY WITH INTRAOPERATIVE CHOLANGIOGRAM;  Surgeon: Manus Rudd, MD;  Location: [facility] OR;  Service: General;  Laterality: N/A;   COLONOSCOPY  2013   due next 03-2017   EYE SURGERY     bil cataracts   IUD REMOVAL     with appendectomy   MASTECTOMY, MODIFIED RADICAL W/RECONSTRUCTION Left 15 years ago   10 nodes out   TUMOR EXCISION Left    x 2, neck, head    Social:  Lives With: daughter  Support: daughter and son Level of Function: performs ADL's with assistance  PCP: [facility]  Substances:denied tobacco, alcohol, and drug use    Allergies: Allergies as of 05/04/2023 - Review Complete 05/04/2023  Allergen Reaction Noted   Erythromycin Shortness Of Breath and Swelling 04/15/2012   Penicillins Rash 04/15/2012   Nyquil [pseudoeph-doxylamine-dm-apap] Rash 04/15/2012   Sulfa antibiotics Rash 04/15/2012    Review of Systems: A complete ROS was negative except as per HPI.   OBJECTIVE:   Physical Exam: Blood pressure 117/66, pulse (!) 122,  temperature 98 F (36.7 C), temperature source Oral, resp. rate 17, height 5\' 2"  (1.575 m), weight 68 kg, SpO2 100 %.  Constitutional: well-appearing, sitting in comfortably, in no acute distress HENT: mucous membranes moist Cardiovascular: irregularly irregular rhythm, no m/r/g, no JVD Pulmonary/Chest: normal work of breathing on room air, lungs clear to auscultation bilaterally. No crackles  Neurological: alert & oriented x 3 MSK: no gross abnormalities. No pitting edema Skin: warm and dry Psych: Normal mood and affect  Labs: CBC    Component Value Date/Time   WBC 7.7 05/04/2023 1229   RBC 2.98 (L) 05/04/2023 1229   HGB  8.9 (L) 05/04/2023 1229   HGB 10.8 (L) 07/07/2020 0938   HGB 10.9 (L) 03/17/2020 1134   HGB 12.3 02/14/2007 0909   HCT 29.3 (L) 05/04/2023 1229   HCT 34.3 03/17/2020 1134   HCT 35.6 02/14/2007 0909   PLT 257 05/04/2023 1229   PLT 266 07/07/2020 0938   PLT 505 (H) 03/17/2020 1134   MCV 98.3 05/04/2023 1229   MCV 89 03/17/2020 1134   MCV 87.5 02/14/2007 0909   MCH 29.9 05/04/2023 1229   MCHC 30.4 05/04/2023 1229   RDW 13.3 05/04/2023 1229   RDW 15.0 03/17/2020 1134   RDW 13.1 02/14/2007 0909   LYMPHSABS 1.1 05/04/2023 1229   LYMPHSABS 2.4 02/14/2007 0909   MONOABS 1.2 (H) 05/04/2023 1229   MONOABS 0.7 02/14/2007 0909   EOSABS 0.3 05/04/2023 1229   EOSABS 0.2 02/14/2007 0909   BASOSABS 0.0 05/04/2023 1229   BASOSABS 0.0 02/14/2007 0909     CMP     Component Value Date/Time   NA 132 (L) 05/04/2023 1229   NA 130 (L) 05/21/2020 0952   K 4.8 05/04/2023 1229   CL 96 (L) 05/04/2023 1229   CO2 26 05/04/2023 1229   GLUCOSE 146 (H) 05/04/2023 1229   BUN 60 (H) 05/04/2023 1229   BUN 38 (H) 05/21/2020 0952   CREATININE 4.34 (H) 05/04/2023 1229   CREATININE 2.00 (H) 02/10/2020 0925   CALCIUM 8.3 (L) 05/04/2023 1229   PROT 6.4 (L) 05/04/2023 1229   PROT 6.7 03/17/2020 1134   ALBUMIN 3.3 (L) 05/04/2023 1229   ALBUMIN 3.7 03/17/2020 1134   AST 16 05/04/2023 1229   AST 24 02/10/2020 0925   ALT 9 05/04/2023 1229   ALT 16 02/10/2020 0925   ALKPHOS 75 05/04/2023 1229   BILITOT 0.4 05/04/2023 1229   BILITOT 0.2 03/17/2020 1134   BILITOT 0.4 02/10/2020 0925   GFRNONAA 10 (L) 05/04/2023 1229   GFRNONAA 23 (L) 02/10/2020 0925   GFRAA 22 (L) 05/21/2020 0952   GFRAA 27 (L) 02/10/2020 0925    Imaging: No results found.    EKG: personally reviewed my interpretation is atrial fibrillation with a prolonged QT. Prior EKG is normal sinus rhythm (7/5) with QT prolongation.  ASSESSMENT & PLAN:   Assessment & Plan by Problem: Active Problems:   * No active hospital problems.  *   Elizabeth Olsen is a 82 y.o. person living with a history end-stage renal disease, of 1 diabetes, coronary artery disease status post stent placement, thyroidism who presented for dialysis and admitted for new onset atrial fibrillation on hospital day 0  1.New onset atrial fibrillation with rapid ventricular response Due to the patients newly onset Afib with RVR after receiving dialysis overnight, current likely cause is due to either stress of dialysis or advanced age ; however, due to her extensive cardiac history of CAD and systolic  heart failure, we will continue further cardiac workup. -She is currently on a diltiazem 125 mg drip, with rates currently in the 120s -TSH, BNP, CBC, and iron panel along with ferritin ordered to evaluate other causes -D-dimer : 3.48, Due to history of recent travel and surgery within the past month, will order D-dimer with low suspicion of a pulmonary emboli causing the new onset A-fib: CTA ordered -Chest x-ray ordered -Echocardiogram ordered -CHA2DS2-VASc 2 score is calculated as 7 -Has-bled score: 2 -Will begin anticoagulation with eliquis 2.5 BID -Cardiology consulted, we appreciate further recommendations   2.ESRD She has been receiving dialysis every Tuesday Thursday Saturday, has been on dialysis for 3 years.  She has an AV fifth fistula on the right forearm, also has newly placed PD catheter. -She received dialysis overnight where they removed 2.2 L -RFP ordered for tomorrow morning -Depending on length of hospital stay, she may need hemodialysis while inpatient   3.T1DM She has an Omnipod with continuous glucose monitoring and automatic insulin dosing -Her current  insulin is Humalog at a rate of 0.85 units/h -May add SSI, will speak with attending  4.HFpEF -Previous echocardiogram from 2021 shows an LVEF of 55 to 60% -Will obtain new echocardiogram during her admission  5.CAD -Currently not taking medications at home for  CAD -Cardiology consulted  6.Hypothyroidism -Will continue her home dose of levothyroxine at 112 mcg  7.left hip pain -Patient experiencing left hip pain, due to history of decreased mobility high concern for falls we will obtain bilateral pelvis x-ray    Diet: Normal VTE:  Eliquis Code: Full  Prior to Admission Living Arrangement: Home, living , recently located to [place] was living with daughter in [place]    Dispo: Admit patient to Observation with expected length of stay less than 2 midnights.  Signed:   Faith Rogue DO Internal Medicine Resident PGY-1 05/05/2023, 8:27 AM   Please contact the on call pager after 5 pm and on weekends at [phone].

## 2023-05-05 NOTE — ED Provider Notes (Signed)
  Physical Exam  BP 117/66   Pulse (!) 122   Temp 98 F (36.7 C) (Oral)   Resp 17   Ht 5\' 2"  (1.575 m)   Wt 68 kg   SpO2 100%   BMI 27.42 kg/m   Physical Exam  Procedures  Procedures  ED Course / MDM   Clinical Course as of 05/05/23 0847  Sat May 05, 2023  1610 Assumed care from Dr. Bebe Shaggy.  82 year old female with a history of ESRD on IHD, and CAD who moved here recently and came to the emergency department for dialysis.  Did receive dialysis and then afterwards went into atrial fibrillation with RVR.  Patient is not currently being treated for atrial fibrillation.  Not on anticoagulation.  Was discussed with medicine for admission and they are requesting that we reach out to cardiology.  Patient has a CHA2DS2-VASc of 6 but has not been tolerating anticoagulation in the past.  Did receive a call back from Dr. Jens Som of cardiology who is requesting that medicine reach out to them at a later time regarding this patient.  [RP]  [phone] Cardiology will see the patient.  Dr. Lily Kocher and Dr. Hessie Diener from medicine made aware. [RP]    Clinical Course User Index [RP] Rondel Baton, MD   Medical Decision Making Amount and/or Complexity of Data Reviewed Labs: ordered. ECG/medicine tests: ordered.  Risk Prescription drug management. Decision regarding hospitalization.      Rondel Baton, MD 05/05/23 [phone]

## 2023-05-05 NOTE — ED Provider Notes (Signed)
EKG Interpretation Date/Time:  Saturday May 05 2023 06:40:08 EDT Ventricular Rate:  124 PR Interval:  177 QRS Duration:  97 QT Interval:  359 QTC Calculation: 516 R Axis:   117  Text Interpretation: Atrial fibrillation Left posterior fascicular block Borderline low voltage, extremity leads Prolonged QT interval Confirmed by Zadie Rhine ([id number]) on 05/05/2023 6:45:42 AM        I was made aware of patient after she returned from dialysis.  In brief, patient came to the ER requiring dialysis after relocating to [facility]. She has been seen by nephrology and has received dialysis.  It is reported she did have a drop in her blood pressure during dialysis. Patient is now tachycardic and appears to be in atrial fibrillation.  Patient and her daughter deny any known history of A-fib previously.  Patient reports feeling improved, denies any chest pain or shortness of breath. On exam she is resting comfortably but tachycardic with a regular rhythm.  She is in no respiratory distress  However due to her plethora of medical conditions including CAD, CHF, ESRD with new onset atrial fibrillation with RVR, patient would benefit from admission and management Daughter at bedside reports patient had difficulty tolerating anticoagulation previously Low-dose Cardizem drip has been ordered Discussed with internal medicine residency for admission as patient has no local primary care since it has been over 3 years since she lived here Plan for medical admission, but request cardiology consultation  This patients CHA2DS2-VASc Score and unadjusted Ischemic Stroke Rate (% per year) is equal to 9.7 % stroke rate/year from a score of 6  Above score calculated as 1 point each if present [CHF, HTN, DM, Vascular=MI/PAD/Aortic Plaque, Age if 65-74, or Female] Above score calculated as 2 points each if present [Age > 75, or Stroke/TIA/TE]    Zadie Rhine, MD 05/05/23 [phone]

## 2023-05-05 NOTE — Progress Notes (Signed)
Notified by family medicine service, patient converted to SR before IV amiodarone was given. Will change to PO amiodarone 400mg  BID for 7 days followed by 200mg  daily. Discussed with ER nurse to omit IV amiodarone orders.

## 2023-05-05 NOTE — Progress Notes (Signed)
Received patient in bed to unit.  Alert and oriented.  Informed consent signed and in chart.   TX duration:  Patient tolerated well.  Transported back to the room  Alert, without acute distress.  Hand-off given to patient's nurse.   Access used: RFA AVF  Access issues: NONE  Total UF removed: 2200  Medication(s) given: TYLENOL 650MG  PO Post HD VS: BP 125/80 Post HD weight: UNABLE TO OBTAIN   [facility]    05/05/23 0359  Vitals  Temp 97.8 F (36.6 C)  Temp Source Oral  BP 125/80  MAP (mmHg) 91  BP Location Left Arm  BP Method Automatic  Patient Position (if appropriate) Lying  Pulse Rate (!) 110  Pulse Rate Source Monitor  ECG Heart Rate (!) 115  Resp 17  Oxygen Therapy  SpO2 96 %  O2 Device Room Air  Patient Activity (if Appropriate) Other (Comment) Tax inspector)  Pulse Oximetry Type Continuous  Post Treatment  Dialyzer Clearance Lightly streaked  Duration of HD Treatment -hour(s) 3.5 hour(s)  Hemodialysis Intake (mL) 200 mL  Liters Processed 84  Fluid Removed (mL) 2200 mL  Tolerated HD Treatment Yes  AVG/AVF Arterial Site Held (minutes) 5 minutes  AVG/AVF Venous Site Held (minutes) 5 minutes  Fistula / Graft Right Forearm Arteriovenous fistula  Placement Date: 02/09/21   Orientation: Right  Access Location: Forearm  Access Type: Arteriovenous fistula  Site Condition No complications  Fistula / Graft Assessment Present;Thrill;Bruit  Status Deaccessed;Flushed;Patent  Drainage Description None

## 2023-05-05 NOTE — Consult Note (Addendum)
Cardiology Consultation   Patient ID: Elizabeth Olsen MRN: [id number]; DOB: 1941-02-26  Admit date: 05/04/2023 Date of Consult: 05/05/2023  PCP:  [facility] Providers Cardiologist:  Gypsy Balsam, MD   {   Patient Profile:   POONAM Olsen is a 82 y.o. 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  History of Present Illness:   [name] with above PMH presented to ER 05/04/23 for need of dialysis. She moved from [place] to [place] recently, outpatient hemodialysis was not established locally, she was referred to ED on 05/04/2023 for routine dialysis.  She is also in the process of transition to peritoneal dialysis, has not completed PD training and had issue with peritoneal dialysis catheter.  She was seen by nephrology at the ER, arranged for hemodialysis and planned for discharge after dialysis.  She was noted tachycardic 05/05/2023 morning with EKG revealing A-fib RVR 124 bpm.  Reportedly she had hypotension episode during dialysis as well.  She was initiated on low-dose diltiazem drip for rate control.  Subsequently she was referred to medicine service for admission and cardiology consult for A-fib RVR.  Upon encounter, patient is resting in bed without distress. She is planning on traveling between [place] and [place].  Daughter is at bedside, reports patient had cardiac stent x 3 placed 2 and half years ago in [place].  She was having ongoing dyspnea with exertion which led to heart catheterization.  She had a good recovery after her cardiac stents placement, denied having any chest pain or DOE or syncope.  She had occasionally experience irregular  heartbeat.  She denied any current heart palpitation, dizziness, SOB, chest pain.  She was not aware of her atrial fibrillation this morning.  She denied history of stroke.  Daughter states her diabetes is finally adequately controlled on the insulin pump.  Daughter states patient has a tendency to bleed from her fistula as well as insulin pump site while on Plavix in the past.  Patient had no history of major brain bleed or GI bleed.  Daughter felt patient is high risk for fall.   Diagnostic workup this admission revealed hyponatremia 132, glucose 146, BUN 60, creatinine 4.34, GFR 10, albumin 3.3.  CBC differential with hemoglobin 8.9, otherwise unremarkable.  EKG this morning revealed A-fib RVR.  Per chart review, patient historically follows Dr. Bing Matter, last seen 04/12/2020.  She has dilated cardiomyopathy, initial echocardiogram from 07/18/2017 with LVEF 45 to 50%, moderate LVH, diffuse hypokinesis, mild AI and MR, mild LAE.  Stress Myoview 06/2017 revealing mild diffuse hypokinesis, no evidence of prior infarct or ischemia, LVEF 45 to 54%.  Event monitor 04/08/2018 for 5 days was done for syncope, revealed no significant arrhythmia, infrequent PVC and APCs.  Repeat Echo from 09/30/18 revealed improved LVEF to 55 to 60%, no RWMA, grade 1 DD, mild MR and TR, moderate LAE, mild LVH, small effusion versus fat pad.  During last office visit in 03/2020, she had complained of bilateral lower extremity swelling, amlodipine was discontinued, hydralazine was increased to 25 mg TID, Lasix was reduced to 40 mg daily, renal disease was felt limiting CHF management.  Most recent echo was completed 11/20/2019 revealing LVEF 55  to 60%, LVH, grade 1 DD, normal RV, moderate LAE, mild MR, trivial TR, mild AI, mild to moderate aortic sclerosis.       Past Medical History:  Diagnosis Date   Acquired hypothyroidism 10/13/2017   Acute diastolic CHF (congestive heart failure) (HCC) 11/20/2019   Acute respiratory failure with  hypoxia (HCC) 10/13/2017   Acute systolic heart failure (HCC) 10/13/2017   Anemia    Anemia associated with diabetes mellitus (HCC) 10/13/2017   Anemia of chronic disease 12/10/2017   Blood transfusion    Blood transfusion without reported diagnosis    with breast surgery   Breast cancer (HCC) 15 years ago   left    Cardiomyopathy (HCC) 07/23/2017   Ejection fraction 45-50% may; from September 2018   CHF (congestive heart failure) (HCC)    Chronic cholecystitis with calculus 01/22/2018   Colon polyp 03/2007   adenomatous   COVID-19 virus infection 11/20/2019   Diabetes mellitus    dx 1998.  was told prior to getting chemo that her bld sugar rose.  She thought it would go back   Dyspnea    d/t anemia   Essential hypertension 09/11/2018   GERD (gastroesophageal reflux disease)    Heart murmur    as child   Hernia, incisional    Hyperlipidemia    Hypertension    Hypothyroidism    Iron deficiency 07/23/2017   Near syncope 04/01/2018   Refractory anemia, unspecified (HCC) 12/10/2017   Type 1 diabetes (HCC) 07/23/2017   Vertigo 06/24/2018   Vitamin D deficiency     Past Surgical History:  Procedure Laterality Date   ABDOMINAL HYSTERECTOMY     APPENDECTOMY     BREAST SURGERY     CHOLECYSTECTOMY  01/22/2018   LAPROSCOPIC    CHOLECYSTECTOMY N/A 01/22/2018   Procedure: LAPAROSCOPIC CHOLECYSTECTOMY WITH INTRAOPERATIVE CHOLANGIOGRAM;  Surgeon: Manus Rudd, MD;  Location: [facility] OR;  Service: General;  Laterality: N/A;   COLONOSCOPY  2013   due next 03-2017   EYE SURGERY     bil cataracts   IUD REMOVAL     with appendectomy   MASTECTOMY, MODIFIED RADICAL W/RECONSTRUCTION Left 15 years ago   10 nodes out   TUMOR EXCISION Left    x 2, neck, head     Home Medications:  Prior to Admission medications   Medication Sig Start Date End Date Taking? Authorizing Provider  cloNIDine (CATAPRES) 0.1 MG tablet Take 0.1 mg by mouth daily as needed (FOR SYSTOLIC BLOOD PRESSURE GREATER THAN 170 AND  OR DIASTOLIC FOR BLOOD PRESSURE GREATER 100). 05/03/21  Yes [provider]  amLODipine (NORVASC) 5 MG tablet Take 5 mg by mouth every morning. Patient not taking: Reported on 05/05/2023 04/30/20   [provider]  ASPIRIN ADULT LOW STRENGTH 81 MG EC tablet Take 81 mg by mouth every morning. 12/26/19   [provider]  atorvastatin (LIPITOR) 10 MG tablet TAKE 1 TABLET BY MOUTH EVERY DAY ONE TIME ONLY 05/19/20   Georgeanna Lea, MD  calcium carbonate (OSCAL) 1500 (600 Ca) MG TABS tablet Take 600 mg by mouth daily with breakfast.     [provider]  carvedilol (COREG) 6.25 MG tablet TAKE 1 TABLET BY MOUTH WITH BREAKFAST & EVENING MEAL EVERY DAY Patient not taking: Reported on 05/05/2023 05/19/20   Georgeanna Lea, MD  Continuous Blood Gluc Sensor (FREESTYLE LIBRE 14 DAY SENSOR) MISC See admin instructions. 10/22/19   [provider]  fenofibrate 160 MG tablet Take 160 mg  by mouth daily.    [provider]  fish oil-omega-3 fatty acids 1000 MG capsule Take 1 g by mouth daily.     [provider]  furosemide (LASIX) 20 MG tablet TAKE 2 TABLETS BY MOUTH EVERY DAY 08/09/20   Georgeanna Lea, MD  furosemide (LASIX) 40 MG tablet Take by mouth 2 (two) times daily. 05/07/20   [provider]  HUMALOG 100 UNIT/ML injection USE as directed via insulin pump. max total daily DOSE of 50 UNITS 02/12/20   [provider]  hydrALAZINE (APRESOLINE) 25 MG tablet Take 1 tablet (25 mg total) by mouth 3 (three) times daily. Patient not taking: Reported on 05/05/2023 05/19/20 08/17/20  Georgeanna Lea, MD  Insulin Disposable Pump (OMNIPOD DASH 5 PACK PODS) MISC SMARTSIG:SUB-Q Every 3 Days 05/11/20   [provider]  insulin glargine (LANTUS SOLOSTAR) 100 UNIT/ML Solostar Pen Inject 7 Units into the skin in the morning. And 6 units at night 01/26/20   [provider]  iron polysaccharides (NIFEREX) 150 MG capsule Take 150 mg by  mouth 2 (two) times daily.     [provider]  levothyroxine (SYNTHROID) 112 MCG tablet Take one tablet 5 days of the week and HOLD 2 days of the week. 01/27/20   [provider]  ONETOUCH VERIO test strip USE TO TEST UP TO 10 TIMES D 08/24/17   [provider]  pantoprazole (PROTONIX) 40 MG tablet Take 1 tablet (40 mg total) by mouth 2 (two) times daily. 11/27/19   Kathlen Mody, MD  vitamin C (ASCORBIC ACID) 500 MG tablet Take 500 mg by mouth daily.    [provider]  Vitamin D, Ergocalciferol, (DRISDOL) 50000 UNITS CAPS Take 50,000 Units by mouth 2 (two) times a week. Sundays & Wednesdays. 02/20/12   [provider]  zinc sulfate 220 (50 Zn) MG capsule Take 1 capsule (220 mg total) by mouth daily. 11/28/19   Kathlen Mody, MD    Inpatient Medications: Scheduled Meds:  Chlorhexidine Gluconate Cloth  6 each Topical Q0600   enoxaparin (LOVENOX) injection  30 mg Subcutaneous Q24H   Continuous Infusions:  diltiazem (CARDIZEM) infusion 7.5 mg/hr (05/05/23 0818)   PRN Meds: acetaminophen, senna-docusate  Allergies:    Allergies  Allergen Reactions   Erythromycin Shortness Of Breath and Swelling    "throat swelling", sob, thrashing ,    Penicillins Rash   Nyquil [Pseudoeph-Doxylamine-Dm-Apap] Rash   Sulfa Antibiotics Rash    Social History:   Social History   Socioeconomic History   Marital status: Married    Spouse name: Not on file   Number of children: 3   Years of education: Not on file   Highest education level: Not on file  Occupational History   Occupation: homemaker  Tobacco Use   Smoking status: Never   Smokeless tobacco: Never  Vaping Use   Vaping Use: Never used  Substance and Sexual Activity   Alcohol use: No   Drug use: No   Sexual activity: Not on file  Other Topics Concern   Not on file  Social History Narrative   Not on file   Social Determinants of Health   Financial Resource Strain: Not on file  Food  Insecurity: Not on file  Transportation Needs: Not on file  Physical Activity: Not on file  Stress: Not on file  Social Connections: Not on file  Intimate Partner Violence: Not on file    Family History:    Family History  Problem Relation Age of Onset   Diabetes Other        both sides of family   Hypertension Father    Congestive Heart Failure Father    Peripheral vascular disease Father    Hypertension Maternal Grandfather    Hypertension Maternal Grandmother    Stomach cancer Maternal Grandmother        GGM   Heart attack Brother    Colon cancer Neg Hx    Esophageal cancer Neg Hx    Pancreatic cancer Neg Hx    Rectal cancer Neg Hx      ROS:  Constitutional: Denied fever, chills, malaise, night sweats Eyes: Denied vision change or loss Ears/Nose/Mouth/Throat: Denied ear ache, sore throat, coughing, sinus pain Cardiovascular: see HPI  Respiratory: see HPI  Gastrointestinal: Denied nausea, vomiting, abdominal pain, diarrhea Genital/Urinary: Denied dysuria, hematuria, urinary frequency/urgency Musculoskeletal: Denied muscle ache, joint pain, weakness Skin: Denied rash, wound Neuro: Denied headache, dizziness, syncope Psych: Denied history of depression/anxiety  Endocrine: history of diabetes   Physical Exam/Data:   Vitals:   05/05/23 0800 05/05/23 0815 05/05/23 0830 05/05/23 0845  BP: 118/69 122/85 103/60 (!) 114/56  Pulse: (!) 125 99 100 (!) 101  Resp: 13 (!) 25 (!) 27 12  Temp:      TempSrc:      SpO2: 97% 99% 98% 97%  Weight:      Height:        Intake/Output Summary (Last 24 hours) at 05/05/2023 0914 Last data filed at 05/05/2023 0359 Gross per 24 hour  Intake --  Output 2200 ml  Net -2200 ml      05/04/2023   10:53 AM 06/12/2020    2:04 PM 05/19/2020    8:45 AM  Last 3 Weights  Weight (lbs) 149 lb 14.6 oz 152 lb 157 lb 1.6 oz  Weight (kg) 68 kg 68.947 kg 71.26 kg     Body mass index is 27.42 kg/m.   Vitals:  Vitals:   05/05/23 0830 05/05/23  0845  BP: 103/60 (!) 114/56  Pulse: 100 (!) 101  Resp: (!) 27 12  Temp:    SpO2: 98% 97%   General Appearance: In no apparent distress, laying in bed, well nourished  HEENT: Normocephalic, atraumatic.  Neck: Supple, trachea midline, no JVDs Cardiovascular: Irregularly irregular, S1-S2, systolic murmur grade 2 LUSB Respiratory: Resting breathing unlabored, lungs sounds clear to auscultation bilaterally, no use of accessory muscles. On room air.  No wheezes, rales or rhonchi.   Gastrointestinal: Bowel sounds positive, abdomen soft, non-tender Extremities: Able to move all extremities in bed without difficulty, no edema of BLE Musculoskeletal: Normal muscle bulk and tone Skin: Intact, warm, dry. No rashes or petechiae noted in exposed areas.  Neurologic: Alert, oriented to person, place and time. Fluent speech, no cognitive deficit,no gross focal neuro deficit Psychiatric: Normal affect. Mood is appropriate.  Right arm fistula with dressing in place not removed for exam     EKG:  The EKG was personally reviewed and demonstrates:    EKG from 05/04/2023 at 1300 revealed sinus rhythm 79 bpm, right axis deviation, manual QT 436 msec.   EKG from 05/05/2023 6:40 AM revealed A-fib RVR 124 bpm  Telemetry:  Telemetry was personally reviewed and demonstrates:    A-fib with ventricular rate 100s at this time   Relevant CV Studies:  Echo from 11/20/2019:  1. Left ventricular ejection fraction, by visual estimation, is 55 to  60%. The left ventricle has normal function. There is mildly  increased  left ventricular hypertrophy.   2. Left ventricular diastolic parameters are consistent with Grade I  diastolic dysfunction (impaired relaxation).   3. The left ventricle has no regional wall motion abnormalities.   4. Global right ventricle has normal systolic function.The right  ventricular size is normal. No increase in right ventricular wall  thickness.   5. Left atrial size was moderately  dilated.   6. Right atrial size was normal.   7. Moderate mitral annular calcification.   8. The mitral valve is normal in structure. Mild mitral valve  regurgitation. No evidence of mitral stenosis.   9. The tricuspid valve is normal in structure. Tricuspid valve  regurgitation is trivial.  10. The aortic valve is tricuspid. Aortic valve regurgitation is mild.  Mild to moderate aortic valve sclerosis/calcification without any evidence  of aortic stenosis.  11. The tricuspid regurgitant velocity is 2.71 m/s, and with an assumed  right atrial pressure of 3 mmHg, the estimated right ventricular systolic  pressure is mildly elevated at 32.4 mmHg.  12. The inferior vena cava is normal in size with greater than 50%  respiratory variability, suggesting right atrial pressure of 3 mmHg.    Laboratory Data:  High Sensitivity Troponin:  No results for input(s): "TROPONINIHS" in the last 720 hours.   Chemistry Recent Labs  Lab 05/04/23 1229  NA 132*  K 4.8  CL 96*  CO2 26  GLUCOSE 146*  BUN 60*  CREATININE 4.34*  CALCIUM 8.3*  GFRNONAA 10*  ANIONGAP 10    Recent Labs  Lab 05/04/23 1229  PROT 6.4*  ALBUMIN 3.3*  AST 16  ALT 9  ALKPHOS 75  BILITOT 0.4   Lipids No results for input(s): "CHOL", "TRIG", "HDL", "LABVLDL", "LDLCALC", "CHOLHDL" in the last 168 hours.  Hematology Recent Labs  Lab 05/04/23 1229  WBC 7.7  RBC 2.98*  HGB 8.9*  HCT 29.3*  MCV 98.3  MCH 29.9  MCHC 30.4  RDW 13.3  PLT 257   Thyroid No results for input(s): "TSH", "FREET4" in the last 168 hours.  BNPNo results for input(s): "BNP", "PROBNP" in the last 168 hours.  DDimer No results for input(s): "DDIMER" in the last 168 hours.   Radiology/Studies:  No results found.   Assessment and Plan:   A-fib with RVR, new diagnosis -No known history of A-fib in the past -Asymptomatic -TSH is pending, echocardiogram is pending -Workup so far revealed ESRD and chronic anemia -Rate control is  improving on diltiazem drip - recommend start chemical cardioversion with amiodarone -Given elevated CHA2DS2-VASc score 7 and sustained A-fib at this time, will start anticoagulation with Eliquis,  family is concerned about tendency to bleed from fistula site as well as insulin pump site, will have to monitor bleeding closely to reassess tolerance of anticoagulation, HAS BLED score 3 - consider watchman outpatient   CAD with hx of stents x2 in 2021 in [place] -No angina or equivalent symptoms at this time -Will need medical records regarding cardiac stent placement -Will need clarification of her current home med list -Continue Lipitor, would start beta blocker if not on this, no ASA if tolerates therapeutic anticoagulation  Chronic diastolic heart failure, recovered LVEF 2019 -Echocardiogram is pending -Clinically euvolemic - GDMT: Need clarification of her current home med list, pending Echo   ESRD Chronic anemia Hypertension Hyperlipidemia -Per family medicine   Risk Assessment/Risk Scores:    New York Heart Association (NYHA) Functional Class NYHA Class II  CHA2DS2-VASc Score = 7  This indicates a 11.2% annual risk of stroke. The patient's score is based upon: CHF History: 1 HTN History: 1 Diabetes History: 1 Stroke History: 0 Vascular Disease History: 1 Age Score: 2 Gender Score: 1  For questions or updates, please contact [facility] Please consult [email or web address] for contact info under   Signed, Cyndi Bender, NP  05/05/2023 9:14 AM As above, patient seen and examined.  Briefly she is an 82 year old female with past medical history of diabetes mellitus, hypertension, coronary artery disease, end-stage renal disease dialysis dependent, hypothyroidism, CHF for evaluation of atrial fibrillation.  Last echocardiogram 2019 showed normalization of LV function and mild mitral and tricuspid regurgitation.  Patient is from [place].  She presented for dialysis (confusion  and arranging while she was in [place]).  While here she developed atrial fibrillation with rapid ventricular response.  Cardiology now asked to evaluate.  She denies dyspnea, chest pain, palpitations or syncope.  She ambulates with a walker but does not fall.  She has had some bleeding from insulin pump site in the past.  She has also had difficulties with hypotension following dialysis.  Electrocardiogram shows atrial fibrillation with rapid ventricular response and right axis deviation.  Creatinine 4.34, hemoglobin 8.9.  1 paroxysmal atrial fibrillation-patient developed atrial fibrillation while in the emergency room.  She is completely asymptomatic with no chest pain, dyspnea or palpitations.  Cardizem was initiated.  However she has difficulties with hypotension following dialysis and Cardizem long-term will exacerbate this.  We will treat with amiodarone both for rate control and hopefully to maintain sinus rhythm.  Schedule echocardiogram to assess LV function.  Check TSH.  Issue of anticoagulation is somewhat difficult.  CHA2DS2-VASc is 7.  She would obviously benefit from anticoagulation.  However she has some instability with ambulating with her walker but has not fallen.  She also has some bleeding at insulin pump site.  I discussed this in depth with she and her daughter.  For now we will begin apixaban 2.5 mg twice daily.  We will refer for evaluation for possible Watchman device as an outpatient.  Patient and daughter are in agreement.  2 history of coronary artery disease-discontinue Plavix given need for apixaban.  Continue home dose of statin at discharge.  3 end-stage renal disease-dialysis per nephrology.  4 hypertension-patient was taking clonidine as needed at time of admission.  Can continue.  Olga Millers, MD

## 2023-05-06 LAB — RENAL FUNCTION PANEL
Albumin: 3 g/dL — ABNORMAL LOW (ref 3.5–5.0)
Anion gap: 8 (ref 5–15)
BUN: 29 mg/dL — ABNORMAL HIGH (ref 8–23)
CO2: 28 mmol/L (ref 22–32)
Calcium: 8.4 mg/dL — ABNORMAL LOW (ref 8.9–10.3)
Chloride: 96 mmol/L — ABNORMAL LOW (ref 98–111)
Creatinine, Ser: 3.46 mg/dL — ABNORMAL HIGH (ref 0.44–1.00)
GFR, Estimated: 13 mL/min — ABNORMAL LOW (ref >60)
Glucose, Bld: 116 mg/dL — ABNORMAL HIGH (ref 70–99)
Phosphorus: 3.4 mg/dL (ref 2.5–4.6)
Potassium: 3.8 mmol/L (ref 3.5–5.1)
Sodium: 132 mmol/L — ABNORMAL LOW (ref 135–145)

## 2023-05-06 LAB — CBC
HCT: 27.2 % — ABNORMAL LOW (ref 36.0–46.0)
Hemoglobin: 8.5 g/dL — ABNORMAL LOW (ref 12.0–15.0)
MCH: 30.1 pg (ref 26.0–34.0)
MCHC: 31.3 g/dL (ref 30.0–36.0)
MCV: 96.5 fL (ref 80.0–100.0)
Platelets: 255 10*3/uL (ref 150–400)
RBC: 2.82 MIL/uL — ABNORMAL LOW (ref 3.87–5.11)
RDW: 13.2 % (ref 11.5–15.5)
WBC: 7.5 10*3/uL (ref 4.0–10.5)
nRBC: 0 % (ref 0.0–0.2)

## 2023-05-06 LAB — GLUCOSE, CAPILLARY
Glucose-Capillary: 186 mg/dL — ABNORMAL HIGH (ref 70–99)
Glucose-Capillary: 221 mg/dL — ABNORMAL HIGH (ref 70–99)

## 2023-05-06 MED ORDER — LEVOTHYROXINE SODIUM 175 MCG PO TABS: 87.0000 ug | ORAL_TABLET | Freq: Every day | ORAL | 0 refills | Status: DC

## 2023-05-06 MED ORDER — METOPROLOL TARTRATE 25 MG PO TABS: 25.0000 mg | ORAL_TABLET | Freq: Two times a day (BID) | ORAL | 0 refills | Status: DC

## 2023-05-06 MED ORDER — LEVOTHYROXINE SODIUM 175 MCG PO TABS
87.0000 ug | ORAL_TABLET | Freq: Every day | ORAL | Status: DC
  Administered 2023-05-06: 87 ug via ORAL
  Filled 2023-05-06: qty 0.5

## 2023-05-06 MED ORDER — METOPROLOL TARTRATE 25 MG PO TABS
25.0000 mg | ORAL_TABLET | Freq: Two times a day (BID) | ORAL | Status: DC
  Administered 2023-05-06: 25 mg via ORAL
  Filled 2023-05-06: qty 1

## 2023-05-06 MED ORDER — APIXABAN 2.5 MG PO TABS: 2.5000 mg | ORAL_TABLET | Freq: Two times a day (BID) | ORAL | 0 refills | Status: DC

## 2023-05-06 NOTE — Discharge Instructions (Signed)

## 2023-05-06 NOTE — Progress Notes (Signed)
  [facility] Progress Note   Subjective: Seen in room. SR on monitor. Feeling good "like a coconut" Denies cp, sob. Daughter states they are going home today.   Objective Vitals:   05/06/23 0400 05/06/23 0500 05/06/23 0654 05/06/23 0655  BP: (!) 161/57 (!) 153/56 (!) 165/73 (!) 165/73  Pulse: 72 71 73 73  Resp: 14 13 19 17   Temp: 98.5 F (36.9 C)     TempSrc: Oral     SpO2: 95% 95% 99% 100%  Weight:      Height:         Additional Objective Labs: Basic Metabolic Panel: Recent Labs  Lab 05/04/23 1229 05/05/23 0900 05/06/23 0110  NA 132*  --  132*  K 4.8  --  3.8  CL 96*  --  96*  CO2 26  --  28  GLUCOSE 146*  --  116*  BUN 60*  --  29*  CREATININE 4.34*  --  3.46*  CALCIUM 8.3*  --  8.4*  PHOS  --  2.3* 3.4   CBC: Recent Labs  Lab 05/04/23 1229 05/06/23 0110  WBC 7.7 7.5  NEUTROABS 5.0  --   HGB 8.9* 8.5*  HCT 29.3* 27.2*  MCV 98.3 96.5  PLT 257 255   Blood Culture No results found for: "SDES", "SPECREQUEST", "CULT", "REPTSTATUS"   Physical Exam General: Well appearing, nad  Heart: RRR Lungs: Clear bilaterally  Abdomen: soft, PD cath in place Extremities: No sig LE edema  Dialysis Access: R AVF +bruit   Medications:   [START ON 05/12/2023] amiodarone  200 mg Oral Daily   amiodarone  400 mg Oral BID   apixaban  2.5 mg Oral BID   Chlorhexidine Gluconate Cloth  6 each Topical Q0600   insulin aspart  0-5 Units Subcutaneous QHS   insulin aspart  0-6 Units Subcutaneous TID WC   insulin glargine-yfgn  10 Units Subcutaneous Daily    Dialysis Orders:  Transfer from [facility] -on HD MWF /planning for PD, cath in place   Assessment/Plan: 1. ESRD - HD MWF. Transferring from [place] [facility] and waiting for final approval from local clinic-[facility]. Should have this on Monday. Daughter states they have been contacted by admin about this.Should be ok to f/u outpatient.  2. AFib with RVR - new onset, started after dialysis 7/6. Cardiology consulted. Now  in SR. Started amiodarone/apixaban  3. Anemia- Hgb 8.5 Tsat 21 Ferritin 1474. Discharging today so will resume ESA/Fe in outpatient setting  4. MBD of CKD -  Ca/Phos acceptable  5. H/o CAD   Tomasa Blase PA-C  [facility] 05/06/2023,9:18 AM

## 2023-05-06 NOTE — TOC Transition Note (Signed)
Transition of Care [facility]) - CM/SW Discharge Note   Patient Details  Name: Elizabeth Olsen MRN: [id number] Date of Birth: 01-09-1941  Transition of Care [facility]) CM/SW Contact:  Ronny Bacon, RN Phone Number: 05/06/2023, 2:02 PM   Clinical Narrative:   Patient is being discharged home today. Thirty day free Eliquis card given to floor nurse to give to patient at discharge.  Explained use of card to daughter.    Final next level of care: Home/Self Care Barriers to Discharge: No Barriers Identified   Patient Goals and CMS Choice      Discharge Placement                         Discharge Plan and Services Additional resources added to the After Visit Summary for                                       Social Determinants of Health (SDOH) Interventions SDOH Screenings   Food Insecurity: No Food Insecurity (05/05/2023)  Housing: Low Risk  (05/05/2023)  Transportation Needs: No Transportation Needs (05/05/2023)  Utilities: Not At Risk (05/05/2023)  Depression (PHQ2-9): Low Risk  (08/24/2019)  Tobacco Use: Low Risk  (05/05/2023)     Readmission Risk Interventions     No data to display

## 2023-05-06 NOTE — Plan of Care (Signed)
PT Discharged home with family. Eliquis card given. AVS reviewed. All follow up questions answered. Belongings returned, IV removed, tele d/c no further questions. BP (!) 139/59 (BP Location: Left Arm)   Pulse 72   Temp 98.4 F (36.9 C) (Oral)   Resp 19   Ht 5\' 2"  (1.575 m)   Wt 68 kg   SpO2 94%   BMI 27.42 kg/m  Reva Bores 05/06/23 2:28 PM

## 2023-05-06 NOTE — Progress Notes (Signed)
Message sent for office staff to arrange follow up with cardiology Dr Bing Matter in 2- 3 weeks.

## 2023-05-06 NOTE — Progress Notes (Signed)
Rounding Note    Patient Name: Elizabeth Olsen Date of Encounter: 05/06/2023  [facility] Cardiologist: Gypsy Balsam, MD   Subjective   No CP or dyspnea  Inpatient Medications    Scheduled Meds:  [START ON 05/12/2023] amiodarone  200 mg Oral Daily   amiodarone  400 mg Oral BID   apixaban  2.5 mg Oral BID   Chlorhexidine Gluconate Cloth  6 each Topical Q0600   insulin aspart  0-5 Units Subcutaneous QHS   insulin aspart  0-6 Units Subcutaneous TID WC   insulin glargine-yfgn  10 Units Subcutaneous Daily   Continuous Infusions:  PRN Meds: acetaminophen, senna-docusate   Vital Signs    Vitals:   05/06/23 0400 05/06/23 0500 05/06/23 0654 05/06/23 0655  BP: (!) 161/57 (!) 153/56 (!) 165/73 (!) 165/73  Pulse: 72 71 73 73  Resp: 14 13 19 17   Temp: 98.5 F (36.9 C)     TempSrc: Oral     SpO2: 95% 95% 99% 100%  Weight:      Height:        Intake/Output Summary (Last 24 hours) at 05/06/2023 0940 Last data filed at 05/06/2023 0544 Gross per 24 hour  Intake 12.72 ml  Output --  Net 12.72 ml      05/04/2023   10:53 AM 06/12/2020    2:04 PM 05/19/2020    8:45 AM  Last 3 Weights  Weight (lbs) 149 lb 14.6 oz 152 lb 157 lb 1.6 oz  Weight (kg) 68 kg 68.947 kg 71.26 kg      Telemetry    Sinus - Personally Reviewed   Physical Exam   GEN: No acute distress.   Neck: No JVD Cardiac: RRR, no murmurs, rubs, or gallops.  Respiratory: Clear to auscultation bilaterally. GI: Soft, nontender, non-distended  MS: No edema Neuro:  Nonfocal  Psych: Normal affect   Labs     Chemistry Recent Labs  Lab 05/04/23 1229 05/06/23 0110  NA 132* 132*  K 4.8 3.8  CL 96* 96*  CO2 26 28  GLUCOSE 146* 116*  BUN 60* 29*  CREATININE 4.34* 3.46*  CALCIUM 8.3* 8.4*  PROT 6.4*  --   ALBUMIN 3.3* 3.0*  AST 16  --   ALT 9  --   ALKPHOS 75  --   BILITOT 0.4  --   GFRNONAA 10* 13*  ANIONGAP 10 8     Hematology Recent Labs  Lab 05/04/23 1229 05/06/23 0110  WBC  7.7 7.5  RBC 2.98* 2.82*  HGB 8.9* 8.5*  HCT 29.3* 27.2*  MCV 98.3 96.5  MCH 29.9 30.1  MCHC 30.4 31.3  RDW 13.3 13.2  PLT 257 255   Thyroid  Recent Labs  Lab 05/05/23 0832  TSH 0.046*    BNP Recent Labs  Lab 05/05/23 0832  BNP 559.9*    DDimer  Recent Labs  Lab 05/05/23 0900  DDIMER 3.48*     Radiology    CT Angio Chest Pulmonary Embolism (PE) W or WO Contrast  Result Date: 05/05/2023 CLINICAL DATA:  Abnormal D-dimer EXAM: CT ANGIOGRAPHY CHEST WITH CONTRAST TECHNIQUE: Multidetector CT imaging of the chest was performed using the standard protocol during bolus administration of intravenous contrast. Multiplanar CT image reconstructions and MIPs were obtained to evaluate the vascular anatomy. RADIATION DOSE REDUCTION: This exam was performed according to the departmental dose-optimization program which includes automated exposure control, adjustment of the mA and/or kV according to patient size and/or use of iterative reconstruction  technique. CONTRAST:  75mL OMNIPAQUE IOHEXOL 350 MG/ML SOLN COMPARISON:  None Available. FINDINGS: Cardiovascular: The heart is mildly enlarged. Aorta is normal in size. There is no pericardial effusion. There are atherosclerotic calcifications of the aorta. There is adequate opacification of the pulmonary arteries to the segmental level. There is no evidence for pulmonary embolism. Mediastinum/Nodes: No enlarged mediastinal, hilar, or axillary lymph nodes. Thyroid gland, trachea, and esophagus demonstrate no significant findings. Lungs/Pleura: Mild emphysematous changes are seen in the lung apices. There is a small to moderate-sized right pleural effusion with atelectasis in the right lower lobe. There some faint patchy ground-glass opacities throughout both lungs. Trachea and central airways are patent. Upper Abdomen: No acute abnormality. Musculoskeletal: Patient is status post left mastectomy and there surgical clips in the anterior right chest wall.  There is a edema in the right breast with skin thickening inferiorly. No acute fractures are seen. Review of the MIP images confirms the above findings. IMPRESSION: 1. No evidence for pulmonary embolism. 2. Small to moderate-sized right pleural effusion with right lower lobe atelectasis. 3. Faint patchy ground-glass opacities throughout both lungs, nonspecific. Findings can be seen in the setting of pulmonary edema or infection. 4. Mild cardiomegaly. 5. Edema in the right breast with skin thickening inferiorly. Correlate clinically for cellulitis. Aortic Atherosclerosis (ICD10-I70.0) and Emphysema (ICD10-J43.9). Electronically Signed   By: Darliss Cheney M.D.   On: 05/05/2023 19:34   DG HIP UNILAT WITH PELVIS 2-3 VIEWS LEFT  Result Date: 05/05/2023 CLINICAL DATA:  Hip pain. EXAM: DG HIP (WITH OR WITHOUT PELVIS) 2-3V LEFT COMPARISON:  None Available. FINDINGS: No fracture. No bone lesion. Skeletal structures are demineralized. Mild to moderate medial left hip joint space narrowing. Minor marginal osteophytes from the inferior base of the left femoral head. Right hip joint, SI joints and pubic symphysis are normally spaced and aligned. Surgical vascular clips noted in the left abdomen and pelvis. Soft tissues are otherwise unremarkable. IMPRESSION: 1. No fracture or acute finding. 2. Mild left hip joint arthropathic changes with medial hip joint space narrowing and minor marginal osteophyte formation from the base of the inferior left femoral head. Electronically Signed   By: Amie Portland M.D.   On: 05/05/2023 10:46   DG HIP UNILAT WITH PELVIS 2-3 VIEWS RIGHT  Result Date: 05/05/2023 CLINICAL DATA:  Pain EXAM: DG HIP (WITH OR WITHOUT PELVIS) 2-3V RIGHT COMPARISON:  04/12/2013 FINDINGS: There is no evidence of hip fracture or dislocation. Mild right hip joint space narrowing. Atherosclerotic vascular calcification. IMPRESSION: Mild right hip osteoarthritis. No acute findings. Electronically Signed   By: Duanne Guess D.O.   On: 05/05/2023 10:45   Portable chest 1 View  Result Date: 05/05/2023 CLINICAL DATA:  Atrial fibrillation with rapid ventricular response. EXAM: PORTABLE CHEST 1 VIEW COMPARISON:  11/21/2019. FINDINGS: Mild enlargement of the cardiac silhouette. No mediastinal or hilar masses. Lungs are hyperexpanded with prominence of the interstitial markings, stable. No lung consolidation or convincing edema. No pleural effusion or pneumothorax. Stable changes from prior left breast surgery. Skeletal structures are grossly intact. IMPRESSION: 1. No acute cardiopulmonary disease. Electronically Signed   By: Amie Portland M.D.   On: 05/05/2023 10:02      Patient Profile   82 year old female with past medical history of diabetes mellitus, hypertension, coronary artery disease, end-stage renal disease dialysis dependent, hypothyroidism, CHF for evaluation of atrial fibrillation. Last echocardiogram 2019 showed normalization of LV function and mild mitral and tricuspid regurgitation. Patient is from [place]. She presented for dialysis (  confusion and arranging while she was in [place]). While here she developed atrial fibrillation with rapid ventricular response. Cardiology now asked to evaluate.   Assessment & Plan    1 paroxysmal atrial fibrillation-patient converted to sinus rhythm yesterday.  There was note of TSH being low and her Synthroid dose needs to be adjusted (could have contributed to atrial fibrillation).  Will discontinue amiodarone.  Instead we will treat with metoprolol 25 mg twice daily.  Echocardiogram is pending.  This could be performed as an outpatient if needed.  Patient's CHA2DS2-VASc is 7 but she has some instability with ambulation.  She also has some occasions of bleeding at her insulin pump site.  Will continue apixaban for now.  We will refer for evaluation for possible Watchman device as an outpatient.  Patient and daughter are in agreement. Patient can be discharged from a  cardiac standpoint and we will arrange follow-up with APP in 2 weeks.    2 history of coronary artery disease-Plavix discontinued given need for apixaban.  Resume home dose of statin at discharge.   3 end-stage renal disease-dialysis per nephrology.   4 hypertension-patient was taking clonidine as needed at time of admission.  Can continue.  Note patient was not taking carvedilol or amlodipine at time of admission and would avoid.  For questions or updates, please contact [facility] Please consult [email or web address] for contact info under        Signed, Olga Millers, MD  05/06/2023, 9:40 AM

## 2023-05-06 NOTE — Discharge Summary (Cosign Needed Addendum)
Name: Elizabeth Olsen MRN: [id number] DOB: 1941-04-26 82 y.o. PCP: [facility]  Date of Admission: 05/04/2023 10:24 AM Date of Discharge: 05/06/2023 2:34 PM Attending Physician: Dr. Oswaldo Done  Discharge Diagnosis: Principal Problem:   Atrial fibrillation with RVR (HCC) Active Problems:   Type 1 diabetes (HCC)   Acquired hypothyroidism   End stage renal disease (HCC)    Discharge Medications: Allergies as of 05/06/2023       Reactions   Erythromycin Shortness Of Breath, Swelling   "throat swelling", sob, thrashing ,    Penicillins Rash   Nyquil [pseudoeph-doxylamine-dm-apap] Rash   Sulfa Antibiotics Rash        Medication List     STOP taking these medications    amLODipine 5 MG tablet Commonly known as: NORVASC   carvedilol 6.25 MG tablet Commonly known as: COREG   clopidogrel 75 MG tablet Commonly known as: PLAVIX   losartan 50 MG tablet Commonly known as: COZAAR       TAKE these medications    apixaban 2.5 MG Tabs tablet Commonly known as: ELIQUIS Take 1 tablet (2.5 mg total) by mouth 2 (two) times daily.   atorvastatin 10 MG tablet Commonly known as: LIPITOR TAKE 1 TABLET BY MOUTH EVERY DAY ONE TIME ONLY   cloNIDine 0.1 MG tablet Commonly known as: CATAPRES Take 0.1 mg by mouth daily as needed (FOR SYSTOLIC BLOOD PRESSURE GREATER THAN 170 AND OR DIASTOLIC FOR BLOOD PRESSURE GREATER 100).   FreeStyle [employer] 14 Day Sensor Misc See admin instructions.   HumaLOG 100 UNIT/ML injection Generic drug: insulin lispro USE as directed via insulin pump. max total daily DOSE of 50 UNITS   Lantus SoloStar 100 UNIT/ML Solostar Pen Generic drug: insulin glargine Inject into the skin in the morning. Sliding scale   levothyroxine 175 MCG tablet Commonly known as: SYNTHROID Take 0.5 tablets (87 mcg total) by mouth daily at 6 (six) AM. Start taking on: May 07, 2023 What changed:  medication strength how much to take when to take this    lidocaine-prilocaine cream Commonly known as: EMLA Apply topically.   metoprolol tartrate 25 MG tablet Commonly known as: LOPRESSOR Take 1 tablet (25 mg total) by mouth 2 (two) times daily. (BETA BLOCKER)   Omnipod DASH Pods (Gen 4) Misc SMARTSIG:SUB-Q Every 3 Days   OneTouch Verio test strip Generic drug: glucose blood USE TO TEST UP TO 10 TIMES D        Disposition and follow-up:   Elizabeth Olsen was discharged from [facility] in Stable condition.  At the hospital follow up visit please address:  1.  Follow-up:  *New onset atrial fibrillation *On anticoagulation therapy *Mobility impairment with use of walker -Monitor HR and rhythm at follow up -Ensure adherence to rate control medication and Eliquis -Monitor for signs of bleeding or recent falls  *Hypothyroidism on replacement *Suppressed TSH -Monitor TSH levels at follow up -Adjust levothyroxine therapy as needed  *ESRD on HD -Follow up with HD at [facility] -Monitor peritoneal dialysis access site -Monitor for signs of overload or uremia  *T1DM -Monitor A1c and insulin supplies  -Monitor for signs or symptoms longstandind T1DM complications  2.  Labs / imaging needed at time of follow-up: A1c, renal function panel, magnesium, and thyroid function panel  3.  Pending labs/ test needing follow-up:   4.  Medication Changes  STOPPED  -Levothyroxine daily  -Plavix as patient was started on     ADDED  -  Levothyroxine daily  -Eliquis 2.5 mg BID  -Metropolol tartrate 25 mg BID    Follow-up Appointments: Patient is to call Internal Medicine Center at [facility] for establishing care in [facility] Cardiology with Dr. Dr Bing Matter in 2- 3 weeks.  Endocrine - patient is to set up appointment with Endocrinologist in [facility] Course by problem list:  Paroxysmal Atrial Fibrillation with Rapid Ventricular Response  New onset Pt initially presented to ED on  05/04/2023 after attempting to get dialysis at a [facility] center; pt had recently moved up to the area from [facility] and had been told paperwork was faxed up to [facility], however when she went to get dialysis at the [facility] center, they were unable to take her and instructed her to present to the ED for dialysis. Unclear if this was an isolated event in the setting of recent HD session and metabolic disturbances vs intermittent course captured while at the hospital. TSH during this admission was suppressed likely in the setting of high dose of levothyroxine, which had not been altered or checked in ~1 year. Couple with her advanced age, likely multifactorial. Patient was initially worked up for PE as patient had been driving up from [facility]; initial d-dimer was elevated, however, CTA chest PE protocol was negative for acute embolism.  Pa tient was started on diltiazem drip and achieved rate and rhythm control. Given her CHADSVASC score of 7, she was also started on low dose apixaban. She will be discharged on metropolol tartrate 25 mg BID and Eliquis 2.5 BID for stroke prophylaxis. She will have an echocardiogram done outpatient to evaluate for structural causes of A fib. Patient and her daughter will be following with Cardiology and Endocrinology outpatient to continue medical monitoring.  End Stage Renal Disease Pt has been receiving hemodialysis Tuesdays, Thursdays, and Saturdays. She is in the process of transitioning from hemodialysis to peritoneal dialysis. She is status post HD on 7/6. Currently working with [facility] dialysis coordinator in [place] to continue using RUE fistula until completing peritoneal dialysis training. She will continue to follow with nephrology after discharge.  Hypothyroidism Pt has a history of hypothyroidism. She was found to have suppressed TSH levels during hospitalization, and so her levothyroxine dose was decreased from 112 mcg to 87 mcg daily. She will need repeat TSH in  around 4 weeks to monitor response to dose change.  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 Reported late Ha1c around a month ago was 6.0%. She was temporarily on SSI and insulin glargine while hospitalized, and transitioned back to home Omnipod and Humalog at discharge.  Hypertension Mildly hypertensive during this hospitalization. Patient prior to admission medications included PRN clonidine for SBP >160.  Normocytic Anemia Likely secondary to renal disease. Continue to monitor at discharge  Discharge Subjective: Patient feeling comfortable and ready to go home. Denies chest pain, shortness of breath, headaches, lightheadedness.   Discharge Exam:   Blood pressure (!) 139/59, pulse 72, temperature 98.4 F (36.9 C), temperature source Oral, resp. rate 19, height 5\' 2"  (1.575 m), weight 68 kg, SpO2 94 %.  Constitutional:well-appearing woman sitting in chair, in no acute distress HENT: normocephalic atraumatic, mucous membranes moist Cardiovascular: regular rate and rhythm, no m/r/g, no JVD, RUE fistula with palpable thrill and bruit on auscultation Pulmonary/Chest: normal work of breathing on room air, lungs clear to auscultation bilaterally. No crackles  Abdominal: soft, non-tender, non-distended. Peritoneal dialysis access capped without surrounding erythema, fluctuance or tenderness Neurological: alert & oriented x 3  MSK: no gross abnormalities. No LE pitting edema.  Skin: warm and dry Psych: Normal mood and affect  Pertinent Labs, Studies, and Procedures:     Latest Ref Rng & Units 05/06/2023    1:10 AM 05/04/2023   12:29 PM 07/07/2020    9:38 AM  CBC  WBC 4.0 - 10.5 K/uL 7.5  7.7  6.8   Hemoglobin 12.0 - 15.0 g/dL 8.5  8.9  53.6   Hematocrit 36.0 - 46.0 % 27.2  29.3  35.2   Platelets 150 - 400 K/uL 255  257  266        Latest Ref Rng & Units 05/06/2023    1:10 AM 05/04/2023   12:29 PM 05/21/2020    9:52 AM  CMP  Glucose 70 - 99 mg/dL 644  034  742   BUN 8 -  23 mg/dL 29  60  38   Creatinine 0.44 - 1.00 mg/dL 5.95  6.38  7.56   Sodium 135 - 145 mmol/L 132  132  130   Potassium 3.5 - 5.1 mmol/L 3.8  4.8  4.5   Chloride 98 - 111 mmol/L 96  96  90   CO2 22 - 32 mmol/L 28  26  25    Calcium 8.9 - 10.3 mg/dL 8.4  8.3  8.9   Total Protein 6.5 - 8.1 g/dL  6.4    Total Bilirubin 0.3 - 1.2 mg/dL  0.4    Alkaline Phos 38 - 126 U/L  75    AST 15 - 41 U/L  16    ALT 0 - 44 U/L  9      CT Angio Chest Pulmonary Embolism (PE) W or WO Contrast  Result Date: 05/05/2023 CLINICAL DATA:  Abnormal D-dimer EXAM: CT ANGIOGRAPHY CHEST WITH CONTRAST TECHNIQUE: Multidetector CT imaging of the chest was performed using the standard protocol during bolus administration of intravenous contrast. Multiplanar CT image reconstructions and MIPs were obtained to evaluate the vascular anatomy. RADIATION DOSE REDUCTION: This exam was performed according to the departmental dose-optimization program which includes automated exposure control, adjustment of the mA and/or kV according to patient size and/or use of iterative reconstruction technique. CONTRAST:  75mL OMNIPAQUE IOHEXOL 350 MG/ML SOLN COMPARISON:  None Available. FINDINGS: Cardiovascular: The heart is mildly enlarged. Aorta is normal in size. There is no pericardial effusion. There are atherosclerotic calcifications of the aorta. There is adequate opacification of the pulmonary arteries to the segmental level. There is no evidence for pulmonary embolism. Mediastinum/Nodes: No enlarged mediastinal, hilar, or axillary lymph nodes. Thyroid gland, trachea, and esophagus demonstrate no significant findings. Lungs/Pleura: Mild emphysematous changes are seen in the lung apices. There is a small to moderate-sized right pleural effusion with atelectasis in the right lower lobe. There some faint patchy ground-glass opacities throughout both lungs. Trachea and central airways are patent. Upper Abdomen: No acute abnormality. Musculoskeletal:  Patient is status post left mastectomy and there surgical clips in the anterior right chest wall. There is a edema in the right breast with skin thickening inferiorly. No acute fractures are seen. Review of the MIP images confirms the above findings. IMPRESSION: 1. No evidence for pulmonary embolism. 2. Small to moderate-sized right pleural effusion with right lower lobe atelectasis. 3. Faint patchy ground-glass opacities throughout both lungs, nonspecific. Findings can be seen in the setting of pulmonary edema or infection. 4. Mild cardiomegaly. 5. Edema in the right breast with skin thickening inferiorly. Correlate clinically for cellulitis. Aortic Atherosclerosis (ICD10-I70.0) and Emphysema (ICD10-J43.9).  Electronically Signed   By: Darliss Cheney M.D.   On: 05/05/2023 19:34   DG HIP UNILAT WITH PELVIS 2-3 VIEWS LEFT  Result Date: 05/05/2023 CLINICAL DATA:  Hip pain. EXAM: DG HIP (WITH OR WITHOUT PELVIS) 2-3V LEFT COMPARISON:  None Available. FINDINGS: No fracture. No bone lesion. Skeletal structures are demineralized. Mild to moderate medial left hip joint space narrowing. Minor marginal osteophytes from the inferior base of the left femoral head. Right hip joint, SI joints and pubic symphysis are normally spaced and aligned. Surgical vascular clips noted in the left abdomen and pelvis. Soft tissues are otherwise unremarkable. IMPRESSION: 1. No fracture or acute finding. 2. Mild left hip joint arthropathic changes with medial hip joint space narrowing and minor marginal osteophyte formation from the base of the inferior left femoral head. Electronically Signed   By: Amie Portland M.D.   On: 05/05/2023 10:46   DG HIP UNILAT WITH PELVIS 2-3 VIEWS RIGHT  Result Date: 05/05/2023 CLINICAL DATA:  Pain EXAM: DG HIP (WITH OR WITHOUT PELVIS) 2-3V RIGHT COMPARISON:  04/12/2013 FINDINGS: There is no evidence of hip fracture or dislocation. Mild right hip joint space narrowing. Atherosclerotic vascular calcification.  IMPRESSION: Mild right hip osteoarthritis. No acute findings. Electronically Signed   By: Duanne Guess D.O.   On: 05/05/2023 10:45   Portable chest 1 View  Result Date: 05/05/2023 CLINICAL DATA:  Atrial fibrillation with rapid ventricular response. EXAM: PORTABLE CHEST 1 VIEW COMPARISON:  11/21/2019. FINDINGS: Mild enlargement of the cardiac silhouette. No mediastinal or hilar masses. Lungs are hyperexpanded with prominence of the interstitial markings, stable. No lung consolidation or convincing edema. No pleural effusion or pneumothorax. Stable changes from prior left breast surgery. Skeletal structures are grossly intact. IMPRESSION: 1. No acute cardiopulmonary disease. Electronically Signed   By: Amie Portland M.D.   On: 05/05/2023 10:02     Discharge Instructions: Discharge Instructions     (HEART FAILURE PATIENTS) Call MD:  Anytime you have any of the following symptoms: 1) 3 pound weight gain in 24 hours or 5 pounds in 1 week 2) shortness of breath, with or without a dry hacking cough 3) swelling in the hands, feet or stomach 4) if you have to sleep on extra pillows at night in order to breathe.   Complete by: As directed    Call MD for:  difficulty breathing, headache or visual disturbances   Complete by: As directed    Call MD for:  extreme fatigue   Complete by: As directed    Call MD for:  hives   Complete by: As directed    Call MD for:  persistant dizziness or light-headedness   Complete by: As directed    Call MD for:  persistant nausea and vomiting   Complete by: As directed    Call MD for:  redness, tenderness, or signs of infection (pain, swelling, redness, odor or green/yellow discharge around incision site)   Complete by: As directed    Call MD for:  severe uncontrolled pain   Complete by: As directed    Call MD for:  temperature >100.4   Complete by: As directed    Diet - low sodium heart healthy   Complete by: As directed    Renal diet   Discharge instructions    Complete by: As directed    Mrs. Mirelez,  It was a pleasure taking care of you during your hospitalization! You were hospitalized for new onset atrial fibrillation after undergoing dialysis in the  hospital. Atrial fibrillation is when your heart beat stops beating regularly. It can increase your risk of blood clots that can then cause stroke or heart attacks, and so we started you on a medication called Eliquis (apixaban) 2.5mg  twice a day to prevent blood clots from forming. We also started you on medications to try to prevent your heart beat from beating irregularly; this is the metoprolol tartrate (lopressor), which you will take 25 mg of twice a day.   Please discontinue Plavix (clopidogrel) 75mg   We also did lab work for you to investigate some other potential causes of the atrial fibrillation and found that one of your thyroid hormone levels was too low, a sign that you were receiving too much replacement thyroid hormone, and so we decreased your levothyroxine to 87 mg a day.  Please continue your other home medications as prescribed by your doctors.   There are a few things for you to do once you leave the hospital:  - Follow-up with cardiology outpatient: Dr. Vanetta Shawl office will contact you - Get an echocardiogram, which is an ultrasound of your heart, to look for any structural/physical differences in your heart that could cause the atrial fibrillation; you should do this at your next cardiology appointment  - Get a repeat thyroid hormone level (TSH) in around 4 weeks to see if the thyroid hormone medication change has improved this level; this can be done at your endocrinology visit  -Please follow up with nephrology (kidney doctor) to continue care in [place]  -Please continue dialysis: call [facility] at [phone]  If you need help setting this up, please call our office at [phone] or your nephrologists office!  -Our office will contact you to set up  an appointment for primary care (family doctor), if you need to reach the office, please call [phone]   - If you need to be seen sooner than your upcoming appointments, you can reach out the the outpatient Internal Medicine clinic at [phone] to set up an appointment  If you experience the following symptoms, please go to the emergency department: -Chest pain -Shortness of breath -Syncope (passing out) -Dizziness -Rapid heartbeats -Palpitations/skipping beats -Nausea and vomiting -Fever -Chills -Swelling in your legs -A weight gain of 3 pounds in the past 24 hours  We're so glad you're feeling well! We only saw the one episode of atrial fibrillation during your hospitalization. However, if your heart beat starts feeling funny, irregular, or too fast, please call your cardiologist or primary care physician.  Thank you for letting us care for you! Dr. Leitha Bleak, PGY2 Dr. Faith Rogue, PGY1 Governor Rooks, Medical Student   Increase activity slowly   Complete by: As directed        Signed: Morene Crocker, MD [facility] - PGY2 Pager: [phone] 05/06/2023, 2:34 PM    Please contact the on call pager after 5 pm and on weekends at [phone].

## 2023-05-07 ENCOUNTER — Other Ambulatory Visit: Payer: Self-pay | Admitting: Student

## 2023-05-07 DIAGNOSIS — E039 Hypothyroidism, unspecified: Secondary | ICD-10-CM

## 2023-05-07 LAB — PARATHYROID HORMONE, INTACT (NO CA): PTH: 162 pg/mL — ABNORMAL HIGH (ref 15–65)

## 2023-05-07 LAB — HEPATITIS B SURFACE ANTIBODY, QUANTITATIVE: Hep B S AB Quant (Post): 52.9 m[IU]/mL

## 2023-05-07 MED ORDER — LEVOTHYROXINE SODIUM 88 MCG PO TABS
88.0000 ug | ORAL_TABLET | Freq: Every day | ORAL | 0 refills | Status: DC
Start: 2023-05-07 — End: 2023-08-21

## 2023-05-07 NOTE — Progress Notes (Signed)
Late Note Entry  Pt d/c to home yesterday with out-pt HD clinic placement pending. Clinicals faxed to [facility] admissions this morning for review for local clinic placement as soon as possible. Pt arrived to area with no local HD placement confirmed. Will f/u with pt's daughter with updates as they are received.   Olivia Canter Renal Navigator [phone]

## 2023-05-07 NOTE — Progress Notes (Unsigned)
Spoke with the pt's daughter, Lanier Prude, via phone at [phone]. Initially, called to clarify a medication change we were making with the pt's levothyroxine, to make sure the family was aware we were changing this medication dose to a simplified 88 mcg tablet (rather than a larger dose tablet which would have to be split in half) and to inform them that this medication was available for pickup. However, during the call, the pt's daughter raised a few concerns about the pt's pain level and her discharge medications. Pt's daughter reported pt has had significant pain, exacerbated by movement, since leaving the hospital, which daughter believes might be related to a pinched nerve. Instructed daughter to use the lidocaine cream to try to improve pain. Also instructed daughter to call the IM outpatient clinic to see about getting a same day or next day appointment for evaluation, but that if she could not get an appointment quickly, to present to the ED for further evaluation. Also discussed pt's discharge medications, specifically Eliquis and metoprolol tartrate. Pt's daughter expressed concern that Eliquis would lead to too much bleeding given pt had already experienced bleeding with hemodialysis and use of insulin monitoring device. Informed pt's daughter that, given pt's elevated stroke risk in setting of new onset A fib, she should really be on Eliquis to reduce stroke risk. Pt's daughter also expressed concern that metoprolol tartrate would make pt unsteady and decrease her heart rate too much. Informed pt's daughter that it is okay for pt's heart rate to decrease while on this medication as long as she remains asymptomatic, and instructed daughter on some symptoms to look out for.  Daughter was reassured at the end of the conversation.

## 2023-05-08 ENCOUNTER — Telehealth: Payer: Self-pay | Admitting: Nurse Practitioner

## 2023-05-08 DIAGNOSIS — N2581 Secondary hyperparathyroidism of renal origin: Secondary | ICD-10-CM | POA: Insufficient documentation

## 2023-05-08 DIAGNOSIS — D509 Iron deficiency anemia, unspecified: Secondary | ICD-10-CM

## 2023-05-08 DIAGNOSIS — I2510 Atherosclerotic heart disease of native coronary artery without angina pectoris: Secondary | ICD-10-CM | POA: Insufficient documentation

## 2023-05-08 DIAGNOSIS — T7840XA Allergy, unspecified, initial encounter: Secondary | ICD-10-CM

## 2023-05-08 DIAGNOSIS — D688 Other specified coagulation defects: Secondary | ICD-10-CM

## 2023-05-08 HISTORY — DX: Iron deficiency anemia, unspecified: D50.9

## 2023-05-08 HISTORY — DX: Atherosclerotic heart disease of native coronary artery without angina pectoris: I25.10

## 2023-05-08 HISTORY — DX: Allergy, unspecified, initial encounter: T78.40XA

## 2023-05-08 HISTORY — DX: Other specified coagulation defects: D68.8

## 2023-05-08 HISTORY — DX: Secondary hyperparathyroidism of renal origin: N25.81

## 2023-05-08 NOTE — Discharge Planning (Signed)
[facility]  Initial Hemodialysis Orders  Dialysis center: [facility]  Patient's name: Elizabeth Olsen DOB: 1940/12/11 AKI or ESRD: ESRD  Discharge diagnosis: Afib RVR new onset 2.   ESRD 3.   DMT1  Allergies:  Allergies  Allergen Reactions   Erythromycin Shortness Of Breath and Swelling    "throat swelling", sob, thrashing ,    Penicillins Rash   Nyquil [Pseudoeph-Doxylamine-Dm-Apap] Rash   Sulfa Antibiotics Rash    Date of First Dialysis: Unknown Cause of renal disease: unknown. Probable HTN,DMT1  Dialysis Prescription: Dialysis Frequency: M,T,TH,F Tx duration: 3.5 hour BFR: 400 DFR: 600 NA(Add titration orders here if needed) EDW: 68  Dialyzer: 180NRe UF profile/Sodium modeling?: No Dialysis Bath: 2.0 K 2.0 Ca  Dialysis access: Access type: R AVF Date placed: Unknown Surgeon: Unknown Needle gauge: 15g  In Center Medications: Heparin Dose: None  Type: NA VDRA: Calcitriol/Hectorol Per protocol Venofer: Per protocol Mircera: 100 mcg IV q 2 weeks Next dose due: 05/09/2023 Sensipar: Per protocol  Discharge labs: Hgb: 8.5 K+: 3.8 Ca: 8.4 Phos: 3.4 Alb: 3.0  Please draw monthly labs. Additional labs needed: NA Additional notes/follow-up: PA/NP/MD Please review records from previous OP clinic. Adjust orders if needed. (Do not have access to records here)  [name] [facility] [phone]

## 2023-05-08 NOTE — Progress Notes (Addendum)
Late Note Entry  Contacted [facility] admissions and local HD clinic to inquire if there is an update on pt's referral/clearance. Awaiting a response.   Olivia Canter Renal Navigator [phone]  Addendum at 2:16 pm: Pt has been accepted at TCU at [facility] on Mon,Tues, Thurs, Fri 11:30 chair time. Pt can start at [facility] on Thursday and will need to arrive at 10:30 on Thursday to complete paperwork prior to treatment. TCU is closed on Wednesday but pt able to treat at [facility] in-center on Wednesday only and will need to arrive at 9:30. Contacted pt's daughter, Byrd Hesselbach, to make her aware of the above arrangements. Daughter agreeable to plans. Referral to be made to home therapies as soon as possible so pt/family can begin PD training in the near future. Home therapy staff at [facility] will flush pt's PD cath likely after pt's treatment on Thursday per TCU RN. Contacted renal NP to provide update and to request that orders be sent to clinic for tomorrow and Thursday.

## 2023-05-08 NOTE — Telephone Encounter (Signed)
Transition of Care - Initial Contact from Inpatient Facility  Date of discharge: 05/06/2023 Date of contact: 05/08/2023  Method: Phone Spoke [id number] Lanier Prude  Patient contacted to discuss transition of care from recent inpatient hospitalization. Patient was admitted to [facility] from 07/05-07/04/2023... with discharge diagnosis of ESRD needing dialysis. She is relocating to [place] from [place].   The discharge medication list was reviewed. Patient understands the changes and has no concerns. She has started on Eliquis and Metoprolol.   Patient will present to [facility] unit on: 05/08/2023  No other concerns at this time.

## 2023-05-16 ENCOUNTER — Encounter (HOSPITAL_COMMUNITY)
Admission: EM | Disposition: A | Discharge status: Home Health | Payer: Self-pay | Source: Home / Self Care | Attending: Infectious Diseases

## 2023-05-16 ENCOUNTER — Inpatient Hospital Stay (HOSPITAL_COMMUNITY)
Admission: EM | Admit: 2023-05-16 | Discharge: 2023-05-18 | DRG: 981 | Disposition: A | Discharge status: Home Health | Payer: 59 | Attending: Infectious Diseases | Admitting: Infectious Diseases

## 2023-05-16 ENCOUNTER — Other Ambulatory Visit: Payer: Self-pay

## 2023-05-16 ENCOUNTER — Encounter (HOSPITAL_COMMUNITY): Payer: Self-pay

## 2023-05-16 ENCOUNTER — Inpatient Hospital Stay (HOSPITAL_COMMUNITY): Payer: 59

## 2023-05-16 ENCOUNTER — Emergency Department (HOSPITAL_COMMUNITY): Payer: 59

## 2023-05-16 DIAGNOSIS — Z79890 Hormone replacement therapy (postmenopausal): Secondary | ICD-10-CM

## 2023-05-16 DIAGNOSIS — I5042 Chronic combined systolic (congestive) and diastolic (congestive) heart failure: Secondary | ICD-10-CM | POA: Diagnosis present

## 2023-05-16 DIAGNOSIS — D631 Anemia in chronic kidney disease: Secondary | ICD-10-CM | POA: Diagnosis present

## 2023-05-16 DIAGNOSIS — I132 Hypertensive heart and chronic kidney disease with heart failure and with stage 5 chronic kidney disease, or end stage renal disease: Secondary | ICD-10-CM | POA: Diagnosis present

## 2023-05-16 DIAGNOSIS — I82601 Acute embolism and thrombosis of unspecified veins of right upper extremity: Secondary | ICD-10-CM | POA: Diagnosis present

## 2023-05-16 DIAGNOSIS — I080 Rheumatic disorders of both mitral and aortic valves: Secondary | ICD-10-CM | POA: Diagnosis present

## 2023-05-16 DIAGNOSIS — I5030 Unspecified diastolic (congestive) heart failure: Secondary | ICD-10-CM | POA: Diagnosis not present

## 2023-05-16 DIAGNOSIS — T82898A Other specified complication of vascular prosthetic devices, implants and grafts, initial encounter: Secondary | ICD-10-CM

## 2023-05-16 DIAGNOSIS — R0600 Dyspnea, unspecified: Secondary | ICD-10-CM | POA: Diagnosis present

## 2023-05-16 DIAGNOSIS — E7800 Pure hypercholesterolemia, unspecified: Secondary | ICD-10-CM | POA: Diagnosis present

## 2023-05-16 DIAGNOSIS — G928 Other toxic encephalopathy: Secondary | ICD-10-CM | POA: Diagnosis not present

## 2023-05-16 DIAGNOSIS — Z79899 Other long term (current) drug therapy: Secondary | ICD-10-CM

## 2023-05-16 DIAGNOSIS — E039 Hypothyroidism, unspecified: Secondary | ICD-10-CM | POA: Diagnosis present

## 2023-05-16 DIAGNOSIS — Z992 Dependence on renal dialysis: Secondary | ICD-10-CM

## 2023-05-16 DIAGNOSIS — Z9012 Acquired absence of left breast and nipple: Secondary | ICD-10-CM

## 2023-05-16 DIAGNOSIS — J9601 Acute respiratory failure with hypoxia: Secondary | ICD-10-CM | POA: Diagnosis present

## 2023-05-16 DIAGNOSIS — J90 Pleural effusion, not elsewhere classified: Principal | ICD-10-CM | POA: Diagnosis present

## 2023-05-16 DIAGNOSIS — I5020 Unspecified systolic (congestive) heart failure: Secondary | ICD-10-CM | POA: Diagnosis not present

## 2023-05-16 DIAGNOSIS — N186 End stage renal disease: Secondary | ICD-10-CM | POA: Diagnosis present

## 2023-05-16 DIAGNOSIS — N2581 Secondary hyperparathyroidism of renal origin: Secondary | ICD-10-CM | POA: Diagnosis present

## 2023-05-16 DIAGNOSIS — R0602 Shortness of breath: Principal | ICD-10-CM

## 2023-05-16 DIAGNOSIS — I480 Paroxysmal atrial fibrillation: Secondary | ICD-10-CM | POA: Diagnosis not present

## 2023-05-16 DIAGNOSIS — Z794 Long term (current) use of insulin: Secondary | ICD-10-CM | POA: Diagnosis not present

## 2023-05-16 DIAGNOSIS — I82290 Acute embolism and thrombosis of other thoracic veins: Secondary | ICD-10-CM | POA: Diagnosis present

## 2023-05-16 DIAGNOSIS — Z9641 Presence of insulin pump (external) (internal): Secondary | ICD-10-CM | POA: Diagnosis present

## 2023-05-16 DIAGNOSIS — Z881 Allergy status to other antibiotic agents status: Secondary | ICD-10-CM

## 2023-05-16 DIAGNOSIS — N179 Acute kidney failure, unspecified: Secondary | ICD-10-CM | POA: Diagnosis not present

## 2023-05-16 DIAGNOSIS — E1022 Type 1 diabetes mellitus with diabetic chronic kidney disease: Secondary | ICD-10-CM | POA: Diagnosis present

## 2023-05-16 DIAGNOSIS — M791 Myalgia, unspecified site: Secondary | ICD-10-CM | POA: Diagnosis present

## 2023-05-16 DIAGNOSIS — Z853 Personal history of malignant neoplasm of breast: Secondary | ICD-10-CM

## 2023-05-16 DIAGNOSIS — I871 Compression of vein: Secondary | ICD-10-CM | POA: Diagnosis present

## 2023-05-16 DIAGNOSIS — I2510 Atherosclerotic heart disease of native coronary artery without angina pectoris: Secondary | ICD-10-CM | POA: Diagnosis present

## 2023-05-16 DIAGNOSIS — K219 Gastro-esophageal reflux disease without esophagitis: Secondary | ICD-10-CM | POA: Diagnosis present

## 2023-05-16 DIAGNOSIS — E8779 Other fluid overload: Secondary | ICD-10-CM | POA: Diagnosis present

## 2023-05-16 DIAGNOSIS — R251 Tremor, unspecified: Secondary | ICD-10-CM | POA: Diagnosis not present

## 2023-05-16 DIAGNOSIS — Z8616 Personal history of COVID-19: Secondary | ICD-10-CM

## 2023-05-16 DIAGNOSIS — Z800 Family history of malignant neoplasm of digestive organs: Secondary | ICD-10-CM

## 2023-05-16 DIAGNOSIS — Z7901 Long term (current) use of anticoagulants: Secondary | ICD-10-CM

## 2023-05-16 DIAGNOSIS — Z86010 Personal history of colonic polyps: Secondary | ICD-10-CM

## 2023-05-16 DIAGNOSIS — E559 Vitamin D deficiency, unspecified: Secondary | ICD-10-CM | POA: Diagnosis present

## 2023-05-16 DIAGNOSIS — I429 Cardiomyopathy, unspecified: Secondary | ICD-10-CM | POA: Diagnosis present

## 2023-05-16 DIAGNOSIS — T40415A Adverse effect of fentanyl or fentanyl analogs, initial encounter: Secondary | ICD-10-CM | POA: Diagnosis not present

## 2023-05-16 DIAGNOSIS — D464 Refractory anemia, unspecified: Secondary | ICD-10-CM | POA: Diagnosis present

## 2023-05-16 DIAGNOSIS — Z833 Family history of diabetes mellitus: Secondary | ICD-10-CM

## 2023-05-16 DIAGNOSIS — Z880 Allergy status to penicillin: Secondary | ICD-10-CM

## 2023-05-16 DIAGNOSIS — M7989 Other specified soft tissue disorders: Secondary | ICD-10-CM | POA: Diagnosis present

## 2023-05-16 DIAGNOSIS — Z8249 Family history of ischemic heart disease and other diseases of the circulatory system: Secondary | ICD-10-CM

## 2023-05-16 DIAGNOSIS — Z882 Allergy status to sulfonamides status: Secondary | ICD-10-CM

## 2023-05-16 HISTORY — PX: PERIPHERAL VASCULAR INTERVENTION: CATH118257

## 2023-05-16 HISTORY — PX: A/V FISTULAGRAM: CATH118298

## 2023-05-16 LAB — ECHOCARDIOGRAM COMPLETE
AR max vel: 1.73 cm2
AV Area VTI: 1.8 cm2
AV Area mean vel: 1.86 cm2
AV Mean grad: 8 mmHg
AV Peak grad: 14.3 mmHg
Ao pk vel: 1.89 m/s
Area-P 1/2: 5.23 cm2
Calc EF: 48.7 %
Height: 62 in
MV VTI: 2.1 cm2
P 1/2 time: 501 msec
S' Lateral: 3.6 cm
Single Plane A2C EF: 45 %
Single Plane A4C EF: 50.4 %
Weight: 2080 oz

## 2023-05-16 LAB — CBC WITH DIFFERENTIAL/PLATELET
Abs Immature Granulocytes: 0.04 10*3/uL (ref 0.00–0.07)
Basophils Absolute: 0 10*3/uL (ref 0.0–0.1)
Basophils Relative: 0 %
Eosinophils Absolute: 0.3 10*3/uL (ref 0.0–0.5)
Eosinophils Relative: 4 %
HCT: 29.2 % — ABNORMAL LOW (ref 36.0–46.0)
Hemoglobin: 8.9 g/dL — ABNORMAL LOW (ref 12.0–15.0)
Immature Granulocytes: 1 %
Lymphocytes Relative: 17 %
Lymphs Abs: 1.2 10*3/uL (ref 0.7–4.0)
MCH: 30.5 pg (ref 26.0–34.0)
MCHC: 30.5 g/dL (ref 30.0–36.0)
MCV: 100 fL (ref 80.0–100.0)
Monocytes Absolute: 1.2 10*3/uL — ABNORMAL HIGH (ref 0.1–1.0)
Monocytes Relative: 17 %
Neutro Abs: 4.2 10*3/uL (ref 1.7–7.7)
Neutrophils Relative %: 61 %
Platelets: 236 10*3/uL (ref 150–400)
RBC: 2.92 MIL/uL — ABNORMAL LOW (ref 3.87–5.11)
RDW: 13.1 % (ref 11.5–15.5)
WBC: 6.8 10*3/uL (ref 4.0–10.5)
nRBC: 0 % (ref 0.0–0.2)

## 2023-05-16 LAB — COMPREHENSIVE METABOLIC PANEL
ALT: 11 U/L (ref 0–44)
AST: 22 U/L (ref 15–41)
Albumin: 3.2 g/dL — ABNORMAL LOW (ref 3.5–5.0)
Alkaline Phosphatase: 69 U/L (ref 38–126)
Anion gap: 14 (ref 5–15)
BUN: 12 mg/dL (ref 8–23)
CO2: 29 mmol/L (ref 22–32)
Calcium: 8 mg/dL — ABNORMAL LOW (ref 8.9–10.3)
Chloride: 93 mmol/L — ABNORMAL LOW (ref 98–111)
Creatinine, Ser: 2.66 mg/dL — ABNORMAL HIGH (ref 0.44–1.00)
GFR, Estimated: 17 mL/min — ABNORMAL LOW (ref >60)
Glucose, Bld: 140 mg/dL — ABNORMAL HIGH (ref 70–99)
Potassium: 3.8 mmol/L (ref 3.5–5.1)
Sodium: 136 mmol/L (ref 135–145)
Total Bilirubin: 0.4 mg/dL (ref 0.3–1.2)
Total Protein: 6.2 g/dL — ABNORMAL LOW (ref 6.5–8.1)

## 2023-05-16 LAB — GLUCOSE, CAPILLARY
Glucose-Capillary: 137 mg/dL — ABNORMAL HIGH (ref 70–99)
Glucose-Capillary: 186 mg/dL — ABNORMAL HIGH (ref 70–99)

## 2023-05-16 LAB — T4, FREE: Free T4: 1.73 ng/dL — ABNORMAL HIGH (ref 0.61–1.12)

## 2023-05-16 LAB — TSH: TSH: 1.061 u[IU]/mL (ref 0.350–4.500)

## 2023-05-16 LAB — TROPONIN I (HIGH SENSITIVITY)
Troponin I (High Sensitivity): 42 ng/L — ABNORMAL HIGH (ref <18)
Troponin I (High Sensitivity): 41 ng/L — ABNORMAL HIGH (ref <18)

## 2023-05-16 LAB — HEPATITIS B SURFACE ANTIGEN: Hepatitis B Surface Ag: NONREACTIVE

## 2023-05-16 SURGERY — A/V FISTULAGRAM
Anesthesia: LOCAL

## 2023-05-16 MED ORDER — ACETAMINOPHEN 650 MG RE SUPP: 650.0000 mg | Freq: Four times a day (QID) | RECTAL | Status: DC | PRN

## 2023-05-16 MED ORDER — LEVOTHYROXINE SODIUM 88 MCG PO TABS: 88.0000 ug | ORAL_TABLET | Freq: Every day | ORAL | Status: DC

## 2023-05-16 MED ORDER — IODIXANOL 320 MG/ML IV SOLN
INTRAVENOUS | Status: DC | PRN
  Administered 2023-05-16: 55 mL

## 2023-05-16 MED ORDER — ATORVASTATIN CALCIUM 10 MG PO TABS
10.0000 mg | ORAL_TABLET | Freq: Every day | ORAL | Status: DC
  Administered 2023-05-17: 10 mg via ORAL
  Filled 2023-05-16: qty 1

## 2023-05-16 MED ORDER — MIDAZOLAM HCL 2 MG/2ML IJ SOLN
INTRAMUSCULAR | Status: AC
  Filled 2023-05-16: qty 2

## 2023-05-16 MED ORDER — APIXABAN 2.5 MG PO TABS: 2.5000 mg | ORAL_TABLET | Freq: Two times a day (BID) | ORAL | Status: DC

## 2023-05-16 MED ORDER — DARBEPOETIN ALFA 100 MCG/0.5ML IJ SOSY
100.0000 ug | PREFILLED_SYRINGE | INTRAMUSCULAR | Status: DC
  Filled 2023-05-16: qty 0.5

## 2023-05-16 MED ORDER — HYDRALAZINE HCL 20 MG/ML IJ SOLN
INTRAMUSCULAR | Status: AC
  Filled 2023-05-16: qty 1

## 2023-05-16 MED ORDER — TRAMADOL HCL 50 MG PO TABS: 25.0000 mg | ORAL_TABLET | Freq: Once | ORAL | Status: DC

## 2023-05-16 MED ORDER — METOPROLOL TARTRATE 25 MG PO TABS
25.0000 mg | ORAL_TABLET | Freq: Two times a day (BID) | ORAL | Status: DC
  Filled 2023-05-16 (×2): qty 1

## 2023-05-16 MED ORDER — INSULIN ASPART 100 UNIT/ML IJ SOLN
0.0000 [IU] | Freq: Three times a day (TID) | INTRAMUSCULAR | Status: DC
  Administered 2023-05-17: 8 [IU] via SUBCUTANEOUS
  Administered 2023-05-17: 11 [IU] via SUBCUTANEOUS

## 2023-05-16 MED ORDER — HYDRALAZINE HCL 20 MG/ML IJ SOLN
INTRAMUSCULAR | Status: DC | PRN
  Administered 2023-05-16 (×2): 10 mg via INTRAVENOUS

## 2023-05-16 MED ORDER — ACETAMINOPHEN 325 MG PO TABS
650.0000 mg | ORAL_TABLET | Freq: Four times a day (QID) | ORAL | Status: DC | PRN
  Administered 2023-05-16 (×2): 650 mg via ORAL
  Filled 2023-05-16 (×3): qty 2

## 2023-05-16 MED ORDER — FENTANYL CITRATE (PF) 100 MCG/2ML IJ SOLN
INTRAMUSCULAR | Status: DC | PRN
  Administered 2023-05-16: 50 ug via INTRAVENOUS

## 2023-05-16 MED ORDER — CHLORHEXIDINE GLUCONATE CLOTH 2 % EX PADS
6.0000 | MEDICATED_PAD | Freq: Every day | CUTANEOUS | Status: DC
  Administered 2023-05-17 – 2023-05-18 (×2): 6 via TOPICAL

## 2023-05-16 MED ORDER — INSULIN ASPART 100 UNIT/ML IJ SOLN: 0.0000 [IU] | Freq: Every day | INTRAMUSCULAR | Status: DC

## 2023-05-16 MED ORDER — HEPARIN SODIUM (PORCINE) 1000 UNIT/ML IJ SOLN
INTRAMUSCULAR | Status: AC
  Filled 2023-05-16: qty 10

## 2023-05-16 MED ORDER — LIDOCAINE HCL (PF) 1 % IJ SOLN
INTRAMUSCULAR | Status: DC | PRN
  Administered 2023-05-16: 5 mL

## 2023-05-16 MED ORDER — LEVOTHYROXINE SODIUM 88 MCG PO TABS
88.0000 ug | ORAL_TABLET | Freq: Every day | ORAL | Status: DC
  Administered 2023-05-17 – 2023-05-18 (×2): 88 ug via ORAL
  Filled 2023-05-16 (×2): qty 1

## 2023-05-16 MED ORDER — HEPARIN (PORCINE) IN NACL 1000-0.9 UT/500ML-% IV SOLN
INTRAVENOUS | Status: DC | PRN
  Administered 2023-05-16: 500 mL

## 2023-05-16 MED ORDER — FENTANYL CITRATE (PF) 100 MCG/2ML IJ SOLN
INTRAMUSCULAR | Status: AC
  Filled 2023-05-16: qty 2

## 2023-05-16 MED ORDER — LIDOCAINE HCL (PF) 1 % IJ SOLN
INTRAMUSCULAR | Status: AC
  Filled 2023-05-16: qty 30

## 2023-05-16 MED ORDER — MIDAZOLAM HCL 2 MG/2ML IJ SOLN
INTRAMUSCULAR | Status: DC | PRN
  Administered 2023-05-16: 1 mg via INTRAVENOUS

## 2023-05-16 MED ORDER — SENNOSIDES-DOCUSATE SODIUM 8.6-50 MG PO TABS: 1.0000 | ORAL_TABLET | Freq: Every evening | ORAL | Status: DC | PRN

## 2023-05-16 SURGICAL SUPPLY — 20 items
BAG SNAP BAND KOVER 36X36 (MISCELLANEOUS) ×2 IMPLANT
BALLN LUTONIX AV 10X60X75 (BALLOONS) ×2
BALLN MUSTANG 12X80X75 (BALLOONS) ×2
BALLN MUSTANG 8X80X75 (BALLOONS) ×2
BALLOON LUTONIX AV 10X60X75 (BALLOONS) IMPLANT
BALLOON MUSTANG 12X80X75 (BALLOONS) IMPLANT
BALLOON MUSTANG 8X80X75 (BALLOONS) IMPLANT
CATH ANGIO 5F BER2 65CM (CATHETERS) IMPLANT
COVER DOME SNAP 22 D (MISCELLANEOUS) ×2 IMPLANT
GLIDEWIRE ADV .035X260CM (WIRE) IMPLANT
KIT ENCORE 26 ADVANTAGE (KITS) IMPLANT
KIT MICROPUNCTURE NIT STIFF (SHEATH) IMPLANT
PROTECTION STATION PRESSURIZED (MISCELLANEOUS) ×2
SHEATH PINNACLE R/O II 6F 4CM (SHEATH) IMPLANT
SHEATH PINNACLE R/O II 7F 4CM (SHEATH) IMPLANT
SHEATH PROBE COVER 6X72 (BAG) ×2 IMPLANT
STATION PROTECTION PRESSURIZED (MISCELLANEOUS) ×2 IMPLANT
STOPCOCK MORSE 400PSI 3WAY (MISCELLANEOUS) ×2 IMPLANT
TRAY PV CATH (CUSTOM PROCEDURE TRAY) ×2 IMPLANT
TUBING CIL FLEX 10 FLL-RA (TUBING) ×2 IMPLANT

## 2023-05-16 NOTE — Consult Note (Signed)
ESRD Consult Note  Assessment/Recommendations:   ESRD -outpatient HD orders: [facility]/TCU.  4 times a week (M, Tu, Th, F).  3 hours.  2K/2 calcium.  F180.  Flow rates: 400/800.  RUE AVF.  Meds: Mircera 100 mcg every 2 weeks (last dose of ESA 6/11 and was 30 mcg), no heparin reported. -HD today after access has been evaluated, reassess tomorrow, will likely plan for MWF schedule while she's here -informed renal navigator in regards to switching her to in-center HD -Plan for PD catheter removal as an outpatient  AHRF, pleural effusion -will UF as tolerated but recommend that she be evaluated for a thoracentesis  RUE swelling, access issues -VVS consulted, poss venous stenosis, fgram later today  Volume/ hypertension -UF as tolerated. Has been leaving under EDW lately as an outpatient, will readjust EDW on d/c  Anemia of Chronic Kidney Disease -hgb 8.9, Aranesp start 7/17 -transfuse prn for hgb <7  Secondary Hyperparathyroidism/Hyperphosphatemia:   -resume home binders if on any -check PO4  Recommendations were discussed with the primary team.  Anthony Sar, MD [facility]  History of Present Illness: Elizabeth Olsen is a/an 82 y.o. female with a past medical history of ESRD, A-fib, CAD, HFrEF, DM 1, hypertension, hypothyroidism, chronic anemia who presents with SOB. Daughter at bedside. They have been going to TCU with possible plan to switch to PD however they have decided that they will not be doing PD moving forward and will only be doing in-center HD for now.She reports that there has been progressively worsening right arm and right breast swelling along with worsening SOB. Makes a tiny amount/trickle amount of urine. Has been leaving under EDW on HD   Medications:  No current facility-administered medications for this encounter.   Current Outpatient Medications  Medication Sig Dispense Refill   apixaban (ELIQUIS) 2.5 MG TABS tablet Take 1 tablet (2.5 mg  total) by mouth 2 (two) times daily. 60 tablet 0   atorvastatin (LIPITOR) 10 MG tablet TAKE 1 TABLET BY MOUTH EVERY DAY ONE TIME ONLY (Patient not taking: Reported on 05/05/2023) 90 tablet 2   cloNIDine (CATAPRES) 0.1 MG tablet Take 0.1 mg by mouth daily as needed (FOR SYSTOLIC BLOOD PRESSURE GREATER THAN 170 AND OR DIASTOLIC FOR BLOOD PRESSURE GREATER 100).     Continuous Blood Gluc Sensor (FREESTYLE LIBRE 14 DAY SENSOR) MISC See admin instructions.     HUMALOG 100 UNIT/ML injection USE as directed via insulin pump. max total daily DOSE of 50 UNITS     Insulin Disposable Pump (OMNIPOD DASH 5 PACK PODS) MISC SMARTSIG:SUB-Q Every 3 Days     insulin glargine (LANTUS SOLOSTAR) 100 UNIT/ML Solostar Pen Inject into the skin in the morning. Sliding scale     levothyroxine (SYNTHROID) 88 MCG tablet Take 1 tablet (88 mcg total) by mouth daily at 6 (six) AM. 30 tablet 0   lidocaine-prilocaine (EMLA) cream Apply topically.     metoprolol tartrate (LOPRESSOR) 25 MG tablet Take 1 tablet (25 mg total) by mouth 2 (two) times daily. (BETA BLOCKER) 60 tablet 0   ONETOUCH VERIO test strip USE TO TEST UP TO 10 TIMES D  12     ALLERGIES Erythromycin, Penicillins, Nyquil [pseudoeph-doxylamine-dm-apap], and Sulfa antibiotics  MEDICAL HISTORY Past Medical History:  Diagnosis Date   Acquired hypothyroidism 10/13/2017   Acute diastolic CHF (congestive heart failure) (HCC) 11/20/2019   Acute respiratory failure with hypoxia (HCC) 10/13/2017   Acute systolic heart failure (HCC) 10/13/2017   Anemia  Anemia associated with diabetes mellitus (HCC) 10/13/2017   Anemia of chronic disease 12/10/2017   Blood transfusion    Blood transfusion without reported diagnosis    with breast surgery   Breast cancer (HCC) 15 years ago   left    Cardiomyopathy (HCC) 07/23/2017   Ejection fraction 45-50% may; from September 2018   CHF (congestive heart failure) (HCC)    Chronic cholecystitis with calculus 01/22/2018   Colon polyp  03/2007   adenomatous   COVID-19 virus infection 11/20/2019   Diabetes mellitus    dx 1998.  was told prior to getting chemo that her bld sugar rose.  She thought it would go back   Dyspnea    d/t anemia   Essential hypertension 09/11/2018   GERD (gastroesophageal reflux disease)    Heart murmur    as child   Hernia, incisional    Hyperlipidemia    Hypertension    Hypothyroidism    Iron deficiency 07/23/2017   Near syncope 04/01/2018   Refractory anemia, unspecified (HCC) 12/10/2017   Type 1 diabetes (HCC) 07/23/2017   Vertigo 06/24/2018   Vitamin D deficiency      SOCIAL HISTORY Social History   Socioeconomic History   Marital status: Married    Spouse name: Not on file   Number of children: 3   Years of education: Not on file   Highest education level: Not on file  Occupational History   Occupation: homemaker  Tobacco Use   Smoking status: Never   Smokeless tobacco: Never  Vaping Use   Vaping status: Never Used  Substance and Sexual Activity   Alcohol use: No   Drug use: No   Sexual activity: Not on file  Other Topics Concern   Not on file  Social History Narrative   Not on file   Social Determinants of Health   Financial Resource Strain: Not on file  Food Insecurity: No Food Insecurity (05/05/2023)   Hunger Vital Sign    Worried About Running Out of Food in the Last Year: Never true    Ran Out of Food in the Last Year: Never true  Transportation Needs: No Transportation Needs (05/05/2023)   PRAPARE - Administrator, Civil Service (Medical): No    Lack of Transportation (Non-Medical): No  Physical Activity: Not on file  Stress: Not on file  Social Connections: Not on file  Intimate Partner Violence: Not At Risk (05/05/2023)   Humiliation, Afraid, Rape, and Kick questionnaire    Fear of Current or Ex-Partner: No    Emotionally Abused: No    Physically Abused: No    Sexually Abused: No     FAMILY HISTORY Family History  Problem Relation Age of  Onset   Diabetes Other        both sides of family   Hypertension Father    Congestive Heart Failure Father    Peripheral vascular disease Father    Hypertension Maternal Grandfather    Hypertension Maternal Grandmother    Stomach cancer Maternal Grandmother        GGM   Heart attack Brother    Colon cancer Neg Hx    Esophageal cancer Neg Hx    Pancreatic cancer Neg Hx    Rectal cancer Neg Hx      Review of Systems: 12 systems were reviewed and negative except per HPI  Physical Exam: Vitals:   05/16/23 0654 05/16/23 0741  BP:  (!) 159/58  Pulse:  (!) 59  Resp:  17  Temp: 97.7 F (36.5 C)   SpO2:  96%   No intake/output data recorded. No intake or output data in the 24 hours ending 05/16/23 0801 General: NAD HEENT: anicteric sclera, MMM CV: normal rate, no murmurs, no edema Lungs: decreased breath sounds right base, on RA, b/l chest expansion Abd: soft, non-tender, non-distended Skin: no visible lesions or rashes Msk: rt breast swelling, rt arm swelling>left, trace edema b/l LEs Neuro: normal speech, no gross focal deficits  Dialysis access: RUE AVF, PD cath in place  Test Results Reviewed Lab Results  Component Value Date   NA 136 05/16/2023   K 3.8 05/16/2023   CL 93 (L) 05/16/2023   CO2 29 05/16/2023   BUN 12 05/16/2023   CREATININE 2.66 (H) 05/16/2023   CALCIUM 8.0 (L) 05/16/2023   ALBUMIN 3.2 (L) 05/16/2023   PHOS 3.4 05/06/2023    I have reviewed relevant outside healthcare records

## 2023-05-16 NOTE — H&P (View-Only) (Signed)
[facility] [place]  Patient name: Elizabeth Olsen MRN: [id number] DOB: 1940-11-25 Sex: female   REQUESTING PROVIDER:    ER   REASON FOR CONSULT:    Dialysis access  HISTORY OF PRESENT ILLNESS:   Elizabeth Olsen is a 82 y.o. female, who came to the emergency department for shortness of breath.  She last had dialysis yesterday.  She had a fistula placed 3 years ago elsewhere.  She last had this intervened on May at a different institution.  She is moving back down here for care.  She has been complaining of upper arm swelling and right breast swelling.  This is the way she felt when she had her fistula intervened on.  Patient has a history of congestive heart failure.  She is a diabetic.  Patient is medically managed for hypertension.  Currently she takes a statin for hypercholesterolemia  PAST MEDICAL HISTORY    Past Medical History:  Diagnosis Date   Acquired hypothyroidism 10/13/2017   Acute diastolic CHF (congestive heart failure) (HCC) 11/20/2019   Acute respiratory failure with hypoxia (HCC) 10/13/2017   Acute systolic heart failure (HCC) 10/13/2017   Anemia    Anemia associated with diabetes mellitus (HCC) 10/13/2017   Anemia of chronic disease 12/10/2017   Blood transfusion    Blood transfusion without reported diagnosis    with breast surgery   Breast cancer (HCC) 15 years ago   left    Cardiomyopathy (HCC) 07/23/2017   Ejection fraction 45-50% may; from September 2018   CHF (congestive heart failure) (HCC)    Chronic cholecystitis with calculus 01/22/2018   Colon polyp 03/2007   adenomatous   COVID-19 virus infection 11/20/2019   Diabetes mellitus    dx 1998.  was told prior to getting chemo that her bld sugar rose.  She thought it would go back   Dyspnea    d/t anemia   Essential hypertension 09/11/2018   GERD (gastroesophageal reflux disease)    Heart murmur    as child   Hernia, incisional     Hyperlipidemia    Hypertension    Hypothyroidism    Iron deficiency 07/23/2017   Near syncope 04/01/2018   Refractory anemia, unspecified (HCC) 12/10/2017   Type 1 diabetes (HCC) 07/23/2017   Vertigo 06/24/2018   Vitamin D deficiency      FAMILY HISTORY   Family History  Problem Relation Age of Onset   Diabetes Other        both sides of family   Hypertension Father    Congestive Heart Failure Father    Peripheral vascular disease Father    Hypertension Maternal Grandfather    Hypertension Maternal Grandmother    Stomach cancer Maternal Grandmother        GGM   Heart attack Brother    Colon cancer Neg Hx    Esophageal cancer Neg Hx    Pancreatic cancer Neg Hx    Rectal cancer Neg Hx     SOCIAL HISTORY:   Social History   Socioeconomic History   Marital status: Married    Spouse name: Not on file   Number of children: 3   Years of education: Not on file   Highest education level: Not on file  Occupational History   Occupation: homemaker  Tobacco Use   Smoking status: Never   Smokeless tobacco: Never  Vaping Use   Vaping status: Never Used  Substance and Sexual Activity   Alcohol use: No  Drug use: No   Sexual activity: Not on file  Other Topics Concern   Not on file  Social History Narrative   Not on file   Social Determinants of Health   Financial Resource Strain: Not on file  Food Insecurity: No Food Insecurity (05/05/2023)   Hunger Vital Sign    Worried About Running Out of Food in the Last Year: Never true    Ran Out of Food in the Last Year: Never true  Transportation Needs: No Transportation Needs (05/05/2023)   PRAPARE - Administrator, Civil Service (Medical): No    Lack of Transportation (Non-Medical): No  Physical Activity: Not on file  Stress: Not on file  Social Connections: Not on file  Intimate Partner Violence: Not At Risk (05/05/2023)   Humiliation, Afraid, Rape, and Kick questionnaire    Fear of Current or Ex-Partner: No     Emotionally Abused: No    Physically Abused: No    Sexually Abused: No    ALLERGIES:    Allergies  Allergen Reactions   Erythromycin Shortness Of Breath and Swelling    "throat swelling", sob, thrashing ,    Penicillins Rash   Nyquil [Pseudoeph-Doxylamine-Dm-Apap] Rash   Sulfa Antibiotics Rash    CURRENT MEDICATIONS:    No current facility-administered medications for this encounter.   Current Outpatient Medications  Medication Sig Dispense Refill   apixaban (ELIQUIS) 2.5 MG TABS tablet Take 1 tablet (2.5 mg total) by mouth 2 (two) times daily. 60 tablet 0   atorvastatin (LIPITOR) 10 MG tablet TAKE 1 TABLET BY MOUTH EVERY DAY ONE TIME ONLY (Patient not taking: Reported on 05/05/2023) 90 tablet 2   cloNIDine (CATAPRES) 0.1 MG tablet Take 0.1 mg by mouth daily as needed (FOR SYSTOLIC BLOOD PRESSURE GREATER THAN 170 AND OR DIASTOLIC FOR BLOOD PRESSURE GREATER 100).     Continuous Blood Gluc Sensor (FREESTYLE LIBRE 14 DAY SENSOR) MISC See admin instructions.     HUMALOG 100 UNIT/ML injection USE as directed via insulin pump. max total daily DOSE of 50 UNITS     Insulin Disposable Pump (OMNIPOD DASH 5 PACK PODS) MISC SMARTSIG:SUB-Q Every 3 Days     insulin glargine (LANTUS SOLOSTAR) 100 UNIT/ML Solostar Pen Inject into the skin in the morning. Sliding scale     levothyroxine (SYNTHROID) 88 MCG tablet Take 1 tablet (88 mcg total) by mouth daily at 6 (six) AM. 30 tablet 0   lidocaine-prilocaine (EMLA) cream Apply topically.     metoprolol tartrate (LOPRESSOR) 25 MG tablet Take 1 tablet (25 mg total) by mouth 2 (two) times daily. (BETA BLOCKER) 60 tablet 0   ONETOUCH VERIO test strip USE TO TEST UP TO 10 TIMES D  12    REVIEW OF SYSTEMS:   [X]  denotes positive finding, [ ]  denotes negative finding Cardiac  Comments:  Chest pain or chest pressure:    Shortness of breath upon exertion:    Short of breath when lying flat: x   Irregular heart rhythm:        Vascular    Pain in calf,  thigh, or hip brought on by ambulation:    Pain in feet at night that wakes you up from your sleep:     Blood clot in your veins:    Leg swelling:         Pulmonary    Oxygen at home:    Productive cough:     Wheezing:  Neurologic    Sudden weakness in arms or legs:     Sudden numbness in arms or legs:     Sudden onset of difficulty speaking or slurred speech:    Temporary loss of vision in one eye:     Problems with dizziness:         Gastrointestinal    Blood in stool:      Vomited blood:         Genitourinary    Burning when urinating:     Blood in urine:        Psychiatric    Major depression:         Hematologic    Bleeding problems:    Problems with blood clotting too easily:        Skin    Rashes or ulcers:        Constitutional    Fever or chills:     PHYSICAL EXAM:   Vitals:   05/16/23 0602 05/16/23 0608 05/16/23 0654 05/16/23 0741  BP: (!) 164/54   (!) 159/58  Pulse: 60   (!) 59  Resp: 16   17  Temp: (!) 97.5 F (36.4 C)  97.7 F (36.5 C)   TempSrc: Oral     SpO2: 100%   96%  Weight:  59 kg    Height:  5\' 2"  (1.575 m)      GENERAL: The patient is a well-nourished female, in no acute distress. The vital signs are documented above. CARDIAC: There is a regular rate and rhythm.  VASCULAR: Surreptitious course right arm fistula.  It is somewhat pulsatile.  Right arm is edematous PULMONARY: Nonlabored respirations ABDOMEN: Soft and non-tender MUSCULOSKELETAL: There are no major deformities or cyanosis. NEUROLOGIC: No focal weakness or paresthesias are detected. SKIN: There are no ulcers or rashes noted. PSYCHIATRIC: The patient has a normal affect.  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  She does have a peritoneal dialysis catheter in place but has elected not to pursue peritoneal  dialysis.  This removed at a later date.  It does not appear that we will have time to do this in the operating today.  I told her she can eat right now and then be n.p.o. after the procedure will likely be this afternoon.   Charlena Cross, MD, FACS [facility] [phone] Pager [phone]

## 2023-05-16 NOTE — ED Triage Notes (Signed)
Recently moved here from [place].   Dialysis T, TH, S but frequently requires additional treatment.   Says she has been feeling short of breath for the last day or two.   Daughter present also sts vascular access in the right arm has suspected complications with swelling to right arm and right breast.   Scheduled to see vascular surgery but they did not have an opening until mid-August. Was encouraged to ED for further evaluation .

## 2023-05-16 NOTE — ED Notes (Signed)
MD at bedside. 

## 2023-05-16 NOTE — ED Provider Notes (Signed)
[place] EMERGENCY DEPARTMENT AT [facility] Provider Note   CSN: [id number] Arrival date & time: 05/16/23  0555     History  Chief Complaint  Patient presents with   Shortness of Breath    Elizabeth Olsen is a 82 y.o. female.  The history is provided by the patient and medical records. No language interpreter was used.  Shortness of Breath Severity:  Severe Onset quality:  Gradual Duration:  3 days Timing:  Constant Progression:  Waxing and waning Chronicity:  Recurrent Context: not URI   Relieved by:  Nothing Worsened by:  Exertion (laying down) Ineffective treatments:  None tried Associated symptoms: no abdominal pain, no chest pain, no cough, no fever, no headaches, no neck pain, no rash, no sputum production, no vomiting and no wheezing   Associated symptoms comment:  Edema      Home Medications Prior to Admission medications   Medication Sig Start Date End Date Taking? Authorizing Provider  apixaban (ELIQUIS) 2.5 MG TABS tablet Take 1 tablet (2.5 mg total) by mouth 2 (two) times daily. 05/06/23   Faith Rogue, DO  atorvastatin (LIPITOR) 10 MG tablet TAKE 1 TABLET BY MOUTH EVERY DAY ONE TIME ONLY Patient not taking: Reported on 05/05/2023 05/19/20   Georgeanna Lea, MD  cloNIDine (CATAPRES) 0.1 MG tablet Take 0.1 mg by mouth daily as needed (FOR SYSTOLIC BLOOD PRESSURE GREATER THAN 170 AND OR DIASTOLIC FOR BLOOD PRESSURE GREATER 100). 05/03/21   [provider]  Continuous Blood Gluc Sensor (FREESTYLE LIBRE 14 DAY SENSOR) MISC See admin instructions. 10/22/19   [provider]  HUMALOG 100 UNIT/ML injection USE as directed via insulin pump. max total daily DOSE of 50 UNITS 02/12/20   [provider]  Insulin Disposable Pump (OMNIPOD DASH 5 PACK PODS) MISC SMARTSIG:SUB-Q Every 3 Days 05/11/20   [provider]  insulin glargine (LANTUS SOLOSTAR) 100 UNIT/ML Solostar Pen Inject into the skin in the morning. Sliding scale  01/26/20   [provider]  levothyroxine (SYNTHROID) 88 MCG tablet Take 1 tablet (88 mcg total) by mouth daily at 6 (six) AM. 05/07/23 06/06/23  Morene Crocker, MD  lidocaine-prilocaine (EMLA) cream Apply topically. 04/07/23   [provider]  metoprolol tartrate (LOPRESSOR) 25 MG tablet Take 1 tablet (25 mg total) by mouth 2 (two) times daily. (BETA BLOCKER) 05/06/23 06/05/23  Faith Rogue, DO  ONETOUCH VERIO test strip USE TO TEST UP TO 10 TIMES D 08/24/17   [provider]      Allergies    Erythromycin, Penicillins, Nyquil [pseudoeph-doxylamine-dm-apap], and Sulfa antibiotics    Review of Systems   Review of Systems  Constitutional:  Negative for chills, fatigue and fever.  HENT:  Negative for congestion.   Respiratory:  Positive for shortness of breath. Negative for cough, sputum production, chest tightness, wheezing and stridor.   Cardiovascular:  Positive for leg swelling. Negative for chest pain and palpitations.  Gastrointestinal:  Positive for diarrhea. Negative for abdominal pain, constipation, nausea and vomiting.  Genitourinary:  Negative for dysuria and flank pain.       Does not make much urine  Musculoskeletal:  Negative for back pain, neck pain and neck stiffness.  Skin:  Negative for rash and wound.  Neurological:  Negative for dizziness and headaches.  Psychiatric/Behavioral:  Negative for agitation and confusion.   All other systems reviewed and are negative.   Physical Exam Updated Vital Signs BP (!) 164/54 (BP Location: Left Arm)   Pulse 60  Temp 97.7 F (36.5 C)   Resp 16   Ht 5\' 2"  (1.575 m)   Wt 59 kg   SpO2 100%   BMI 23.78 kg/m  Physical Exam Vitals and nursing note reviewed.  Constitutional:      General: She is not in acute distress.    Appearance: She is well-developed. She is not ill-appearing, toxic-appearing or diaphoretic.  HENT:     Head: Normocephalic and atraumatic.  Eyes:     Extraocular Movements:  Extraocular movements intact.     Conjunctiva/sclera: Conjunctivae normal.     Pupils: Pupils are equal, round, and reactive to light.  Cardiovascular:     Rate and Rhythm: Normal rate and regular rhythm.     Heart sounds: Murmur heard.  Pulmonary:     Effort: Pulmonary effort is normal. Tachypnea present. No respiratory distress.     Breath sounds: Decreased breath sounds and rales present. No wheezing.  Chest:     Chest wall: No tenderness.  Abdominal:     Palpations: Abdomen is soft.     Tenderness: There is no abdominal tenderness.  Musculoskeletal:        General: No swelling.     Cervical back: Neck supple.     Right lower leg: Edema present.     Left lower leg: Edema present.  Skin:    General: Skin is warm and dry.     Capillary Refill: Capillary refill takes less than 2 seconds.     Findings: No erythema.  Neurological:     General: No focal deficit present.     Mental Status: She is alert.  Psychiatric:        Mood and Affect: Mood normal.     ED Results / Procedures / Treatments   Labs (all labs ordered are listed, but only abnormal results are displayed) Labs Reviewed  CBC WITH DIFFERENTIAL/PLATELET - Abnormal; Notable for the following components:      Result Value   RBC 2.92 (*)    Hemoglobin 8.9 (*)    HCT 29.2 (*)    Monocytes Absolute 1.2 (*)    All other components within normal limits  COMPREHENSIVE METABOLIC PANEL - Abnormal; Notable for the following components:   Chloride 93 (*)    Glucose, Bld 140 (*)    Creatinine, Ser 2.66 (*)    Calcium 8.0 (*)    Total Protein 6.2 (*)    Albumin 3.2 (*)    GFR, Estimated 17 (*)    All other components within normal limits  TROPONIN I (HIGH SENSITIVITY)    EKG EKG Interpretation Date/Time:  Wednesday May 16 2023 05:56:59 EDT Ventricular Rate:  57 PR Interval:  180 QRS Duration:  114 QT Interval:  476 QTC Calculation: 463 R Axis:   117  Text Interpretation: Sinus bradycardia Right axis  deviation Cannot rule out Anterior infarct , age undetermined Abnormal ECG When compared with ECG of 06-May-2023 06:51, PREVIOUS ECG IS PRESENT when compared top rior, similar appearance with slower rate. No STEMI Confirmed by Theda Belfast ([id number]) on 05/16/2023 7:18:33 AM  Radiology DG Chest 2 View  Result Date: 05/16/2023 CLINICAL DATA:  Shortness of breath. EXAM: CHEST - 2 VIEW COMPARISON:  05/05/2023 chest x-ray.  Chest CT 05/05/2023. FINDINGS: The cardio pericardial silhouette is enlarged. Left lung clear. New consolidative airspace disease at the right base with small to moderate right pleural effusion. Bones are diffusely demineralized. IMPRESSION: Consolidative airspace disease at the right base with small to moderate right  pleural effusion is progressive since prior CT of 05/05/2023. Electronically Signed   By: Kennith Center M.D.   On: 05/16/2023 07:00    Procedures Procedures    Medications Ordered in ED Medications - No data to display  ED Course/ Medical Decision Making/ A&P                             Medical Decision Making Amount and/or Complexity of Data Reviewed Labs: ordered. Radiology: ordered.  Risk Decision regarding hospitalization.    Elizabeth Olsen is a 82 y.o. 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  According to patient and daughter, patient has had several days of worsening shortness of breath.  She they have been monitoring her options at home and they have in the 80s quite persistently.  Patient is very symptomatic when stressed to walk around or ambulate whatsoever.  She is denying any chest pain or pressure and is not reporting any  fevers, chills, or cough.  They say over the last few weeks she has had swelling in her right arm and right chest that seems to be worsening similar to how long before they had to get her fistula ballooned in [place].  They just moved here this month.  They report that she was getting 3 dialysis treatments a week and occasionally needing 4 treatments a week as they were initially preparing to transition to peritoneal dialysis but they have since called that plan off.  Family says that patient is also had more fatigue.  Daughter is concerned because another time she was like this, she had to get 3 stents in her heart because she was having shortness of breath with a cardiac in etiology and not presenting with chest pain.  Otherwise family thinks that her legs are slightly more edematous than baseline and she denies any other focal neurologic deficits.  She denies nausea or vomiting and has no constipation.  She has had some mild diarrhea recently.  She makes minimal urine.  Chart review shows that she had a CT PE study 11 days ago that did not show blood clot and showed evidence of pleural effusion and pulmonary edema.  On exam, patient has rales in the bases and diminished breath sounds.  She does have a murmur.  Abdomen and chest nontender.  She has palpable pulses in extremities and patient's right arm is more swollen than the left.  She does have a palpable thrill and has radial pulse on the right arm.  Patient vital signs did not reveal fever, tachycardia or tachypnea.  EKG did not show STEMI and did not show arrhythmia.  Clinically I am concerned that patient is somewhat more fluid overloaded with the shortness of breath and hypoxia at home.  Here she is in the low 90s on room air while resting.  Chest x-ray was obtained showing worsening consolidation and pleural effusion compared to recently.  Will call nephrology to discuss if patient needs more dialysis.  With the family concern that the  patient's arm is getting more swollen, they are concerned about a complication or problem with her access fistula.  Although it is working for them,  we will call vascular to see if they would like to evaluate her in the emergency department.  They are scheduled to see them in just over a month and vascular clinic.  Will add on troponin given the patient's report that she has presented like this with a cardiac etiology of shortness of breath.  Anticipate reassessment after workup and consultations.  7:59 AM Spoke to Dr. Myra Gianotti with vascular surgery who will send someone out to evaluate the patient.  He request that she remain n.p.o. for possible evaluation or procedure today.  8:11 AM Spoke to nephrology who will also come see patient to determine if she needs dialysis today.  10:24 AM Vascular surgery came to see patient and they will likely go to assess the fistula with a fistulogram this afternoon and possible intervention.  Nephrology will plan for dialysis after intervention and recommend admission to the hospital.  Will call medicine for admission.         Final Clinical Impression(s) / ED Diagnoses Final diagnoses:  SOB (shortness of breath)    Clinical Impression: 1. SOB (shortness of breath)     Disposition: Admit  This note was prepared with assistance of Dragon voice recognition software. Occasional wrong-word or sound-a-like substitutions may have occurred due to the inherent limitations of voice recognition software.      Babette Stum, Canary Brim, MD 05/16/23 1622

## 2023-05-16 NOTE — H&P (Addendum)
Date: 05/16/2023               Patient Name:  Elizabeth Olsen MRN: [id number]  DOB: 01/05/1941 Age / Sex: 82 y.o., female   PCP: [facility]         Medical Service: Internal Medicine Teaching Service         Attending Physician: Dr. Reymundo Poll, MD      First Contact: Faith Rogue, DO        Pager: [place] [phone]        Second Contact: Morene Crocker, MD    Pager: [facility] [phone]    Chief Complaint: Worsening shortness of breath  History of Present Illness:  Pt with ESRD on HD, and HFrEF presenting to the ED with shortness of breath for 2 days that is progressively worsening. States pt at baseline sleeps with one pillow but currently using multiple pillows and had to sleep on a recliner. She states she has been more weaker lately.   She presented to ED on 05/05/23 as was unable to get HD since moving to [place] form [place]. She was noted to be a-fib and started on eliquis. Her plavix was stopped.    Review of Systems negative unless stated in the HPI.  In the ED, Nephrology and vascular surgery was consulted for HD and fistula respectively. IMTS was consulted for admission.   Past Medical History: Past Medical History:  Diagnosis Date   Acquired hypothyroidism 10/13/2017   Acute diastolic CHF (congestive heart failure) (HCC) 11/20/2019   Acute respiratory failure with hypoxia (HCC) 10/13/2017   Acute systolic heart failure (HCC) 10/13/2017   Anemia    Anemia associated with diabetes mellitus (HCC) 10/13/2017   Anemia of chronic disease 12/10/2017   Blood transfusion    Blood transfusion without reported diagnosis    with breast surgery   Breast cancer (HCC) 15 years ago   left    Cardiomyopathy (HCC) 07/23/2017   Ejection fraction 45-50% may; from September 2018   CHF (congestive heart failure) (HCC)    Chronic cholecystitis with calculus 01/22/2018   Colon polyp 03/2007   adenomatous   COVID-19 virus infection 11/20/2019    Diabetes mellitus    dx 1998.  was told prior to getting chemo that her bld sugar rose.  She thought it would go back   Dyspnea    d/t anemia   Essential hypertension 09/11/2018   GERD (gastroesophageal reflux disease)    Heart murmur    as child   Hernia, incisional    Hyperlipidemia    Hypertension    Hypothyroidism    Iron deficiency 07/23/2017   Near syncope 04/01/2018   Refractory anemia, unspecified (HCC) 12/10/2017   Type 1 diabetes (HCC) 07/23/2017   Vertigo 06/24/2018   Vitamin D deficiency     Meds: Current Outpatient Medications  Medication Instructions   apixaban (ELIQUIS) 2.5 mg, Oral, 2 times daily   atorvastatin (LIPITOR) 10 MG tablet TAKE 1 TABLET BY MOUTH EVERY DAY ONE TIME ONLY   cloNIDine (CATAPRES) 0.1 mg, Oral, Daily PRN   Continuous Blood Gluc Sensor (FREESTYLE LIBRE 14 DAY SENSOR) MISC See admin instructions   HUMALOG 100 UNIT/ML injection USE as directed via insulin pump. max total daily DOSE of 50 UNITS   Insulin Disposable Pump (OMNIPOD DASH 5 PACK PODS) MISC SMARTSIG:SUB-Q Every 3 Days   insulin glargine (LANTUS SOLOSTAR) 100 UNIT/ML Solostar Pen Subcutaneous, Every morning, Sliding scale   levothyroxine (SYNTHROID) 88  mcg, Oral, Daily   lidocaine-prilocaine (EMLA) cream Topical   metoprolol tartrate (LOPRESSOR) 25 mg, Oral, 2 times daily, (BETA BLOCKER)   ONETOUCH VERIO test strip USE TO TEST UP TO 10 TIMES D  Clonidine if sys BP>160 Levothyroxine 88  Allergies: Allergies as of 05/16/2023 - Review Complete 05/16/2023  Allergen Reaction Noted   Erythromycin Shortness Of Breath and Swelling 04/15/2012   Penicillins Rash 04/15/2012   Nyquil [pseudoeph-doxylamine-dm-apap] Rash 04/15/2012   Sulfa antibiotics Rash 04/15/2012    Past Surgical History: Past Surgical History:  Procedure Laterality Date   ABDOMINAL HYSTERECTOMY     APPENDECTOMY     BREAST SURGERY     CHOLECYSTECTOMY  01/22/2018   LAPROSCOPIC    CHOLECYSTECTOMY N/A 01/22/2018    Procedure: LAPAROSCOPIC CHOLECYSTECTOMY WITH INTRAOPERATIVE CHOLANGIOGRAM;  Surgeon: Manus Rudd, MD;  Location: [facility] OR;  Service: General;  Laterality: N/A;   COLONOSCOPY  2013   due next 03-2017   EYE SURGERY     bil cataracts   IUD REMOVAL     with appendectomy   MASTECTOMY, MODIFIED RADICAL W/RECONSTRUCTION Left 15 years ago   10 nodes out   TUMOR EXCISION Left    x 2, neck, head    Family History:  Family History  Problem Relation Age of Onset   Diabetes Other        both sides of family   Hypertension Father    Congestive Heart Failure Father    Peripheral vascular disease Father    Hypertension Maternal Grandfather    Hypertension Maternal Grandmother    Stomach cancer Maternal Grandmother        GGM   Heart attack Brother    Colon cancer Neg Hx    Esophageal cancer Neg Hx    Pancreatic cancer Neg Hx    Rectal cancer Neg Hx     Social History:  Lives with: daughter Currently retired was Nurse, learning disability.  Tobacco- never smoker EtOH- never Illicit drug use- denies use.  IADLs/ADLs- has a caregiver for 3 years  Physical Exam: Blood pressure (!) 171/60, pulse 62, temperature 97.6 F (36.4 C), temperature source Oral, resp. rate 15, height 5\' 2"  (1.575 m), weight 59 kg, SpO2 99%. General: frail appearing elderly woman HENT: NCAT Lungs: crackles up to 2/3 of her bilateral lungs Cardiovascular: NSR, good radial pulses Abdomen: No TTP, normal bowel sounds MSK: no asymmetry, right breast is slightly swollen compared to the left side, right UE swollen compared to the left and has fistula with palpable thrill Skin: no lesions on skin Neuro: alert and oriented x4 Psych: slightly depressed mood  Diagnostics:     Latest Ref Rng & Units 05/16/2023    6:10 AM 05/06/2023    1:10 AM 05/04/2023   12:29 PM  CBC  WBC 4.0 - 10.5 K/uL 6.8  7.5  7.7   Hemoglobin 12.0 - 15.0 g/dL 8.9  8.5  8.9   Hematocrit 36.0 - 46.0 % 29.2  27.2  29.3   Platelets 150 - 400 K/uL 236  255  257         Latest Ref Rng & Units 05/16/2023    6:10 AM 05/06/2023    1:10 AM 05/04/2023   12:29 PM  CMP  Glucose 70 - 99 mg/dL 956  387  564   BUN 8 - 23 mg/dL 12  29  60   Creatinine 0.44 - 1.00 mg/dL 3.32  9.51  8.84   Sodium 135 - 145 mmol/L 136  132  132   Potassium 3.5 - 5.1 mmol/L 3.8  3.8  4.8   Chloride 98 - 111 mmol/L 93  96  96   CO2 22 - 32 mmol/L 29  28  26    Calcium 8.9 - 10.3 mg/dL 8.0  8.4  8.3   Total Protein 6.5 - 8.1 g/dL 6.2   6.4   Total Bilirubin 0.3 - 1.2 mg/dL 0.4   0.4   Alkaline Phos 38 - 126 U/L 69   75   AST 15 - 41 U/L 22   16   ALT 0 - 44 U/L 11   9     DG Chest 2 View  Result Date: 05/16/2023 CLINICAL DATA:  Shortness of breath. EXAM: CHEST - 2 VIEW COMPARISON:  05/05/2023 chest x-ray.  Chest CT 05/05/2023. FINDINGS: The cardio pericardial silhouette is enlarged. Left lung clear. New consolidative airspace disease at the right base with small to moderate right pleural effusion. Bones are diffusely demineralized. IMPRESSION: Consolidative airspace disease at the right base with small to moderate right pleural effusion is progressive since prior CT of 05/05/2023. Electronically Signed   By: Kennith Center M.D.   On: 05/16/2023 07:00     EKG: personally reviewed my interpretation is NSR  CXR: personally reviewed my interpretation is right pleural effusion.   Assessment & Plan by Problem:  Present on Admission:  Acute dyspnea   Acute Dyspnea 2/2 to volume overload ESRD on HD Heart Failure with Preserved EF Pt's symptoms explained by her volume overload 2/2 to her ESRD along with HFpEF. She has been getting dialysis but per nephrology note needs new dry weight. She may be losing muscle mass overtime 2/2 to ESRD and having more water weight. Her albumin is low and will get prealbumin to assess her nutrition status. Nephrology is planning to get pt close to her dry weight while she is inpatient. Plan for HD tonight after fistula revision. Pt has complaint of  dyspnea but her oxygen saturation is normal. I suspect this is secondary to her pleural effusion. PCCM consulted for thoracentesis. Given sensation of dyspnea, will check TSH and FT4 given she had abnormal TSH last visit and adjustment was made to her dose.  -Trend BMP daily.  -Trend weight daily -follow up PCCM consult; appreciate their recommendation  Right Breast Swelling: Appears this is chronic in nature and pt had ultrasound with benign findings after the swelling. I am unable to locate this. Will try to locate the records and if unsuccessful, will need repeat ultrasound given hx of breast cancer.   Chronic Problems Afib: sinus rhythm today. Continue metoprolol and hold eliquis given upcoming procedure.  T1DM: Start SSI given NPO for procedure Hypothyroidism: Check thyroid studies, continue levothyroxine.  Hypertension: Continue metoprolol, HD will help significantly. Can use clonidine with home parameters while here if needed.  HLD: Continue home lipitor.  Normocytic Anemia: Hgb low at 8.9 but appears to be dilutional. Will continue to trend.   DVT prophx: SCD Diet: NPO Bowel: PRN Code:Full  Prior to Admission Living Arrangement: Home Anticipated Discharge Location: Home Barriers to Discharge: Medical Workup  Dispo: Admit patient to Inpatient with expected length of stay greater than 2 midnights.  [name], MD [name]. [facility] Internal Medicine Residency, PGY-3 Pager: [phone] After 5 pm or weekends:  1st Contact: Pager: [phone]  2nd Contact: Pager: [phone]

## 2023-05-16 NOTE — H&P (Signed)
Vascular and Vein Specialist of [facility]  Patient seen and examined in preop holding.  No complaints. No changes to medication history or physical exam since last seen in clinic. After discussing the risks and benefits of Left arm fistulagram for swelling and concern for central stenosis, Elizabeth Olsen elected to proceed.   Victorino Sparrow MD   Patient name: Elizabeth Olsen MRN: [id number] DOB: [birth year] Sex: female   REQUESTING PROVIDER:    ER   REASON FOR CONSULT:    Dialysis access  HISTORY OF PRESENT ILLNESS:   Elizabeth Olsen is a 82 y.o. female, who came to the emergency department for shortness of breath.  She last had dialysis yesterday.  She had a fistula placed 3 years ago elsewhere.  She last had this intervened on May at a different institution.  She is moving back down here for care.  She has been complaining of upper arm swelling and right breast swelling.  This is the way she felt when she had her fistula intervened on.  Patient has a history of congestive heart failure.  She is a diabetic.  Patient is medically managed for hypertension.  Currently she takes a statin for hypercholesterolemia  PAST MEDICAL HISTORY    Past Medical History:  Diagnosis Date   Acquired hypothyroidism 10/13/2017   Acute diastolic CHF (congestive heart failure) (HCC) 11/20/2019   Acute respiratory failure with hypoxia (HCC) 10/13/2017   Acute systolic heart failure (HCC) 10/13/2017   Anemia    Anemia associated with diabetes mellitus (HCC) 10/13/2017   Anemia of chronic disease 12/10/2017   Blood transfusion    Blood transfusion without reported diagnosis    with breast surgery   Breast cancer (HCC) 15 years ago   left    Cardiomyopathy (HCC) 07/23/2017   Ejection fraction 45-50% may; from September 2018   CHF (congestive heart failure) (HCC)    Chronic cholecystitis with calculus 01/22/2018   Colon polyp 03/2007   adenomatous   COVID-19 virus  infection 11/20/2019   Diabetes mellitus    dx 1998.  was told prior to getting chemo that her bld sugar rose.  She thought it would go back   Dyspnea    d/t anemia   Essential hypertension 09/11/2018   GERD (gastroesophageal reflux disease)    Heart murmur    as child   Hernia, incisional    Hyperlipidemia    Hypertension    Hypothyroidism    Iron deficiency 07/23/2017   Near syncope 04/01/2018   Refractory anemia, unspecified (HCC) 12/10/2017   Type 1 diabetes (HCC) 07/23/2017   Vertigo 06/24/2018   Vitamin D deficiency      FAMILY HISTORY   Family History  Problem Relation Age of Onset   Diabetes Other        both sides of family   Hypertension Father    Congestive Heart Failure Father    Peripheral vascular disease Father    Hypertension Maternal Grandfather    Hypertension Maternal Grandmother    Stomach cancer Maternal Grandmother        GGM   Heart attack Brother    Colon cancer Neg Hx    Esophageal cancer Neg Hx    Pancreatic cancer Neg Hx    Rectal cancer Neg Hx     SOCIAL HISTORY:   Social History   Socioeconomic History   Marital status: Married    Spouse name: Not on file   Number of children: 3  Years of education: Not on file   Highest education level: Not on file  Occupational History   Occupation: homemaker  Tobacco Use   Smoking status: Never   Smokeless tobacco: Never  Vaping Use   Vaping status: Never Used  Substance and Sexual Activity   Alcohol use: No   Drug use: No   Sexual activity: Not on file  Other Topics Concern   Not on file  Social History Narrative   Not on file   Social Determinants of Health   Financial Resource Strain: Not on file  Food Insecurity: No Food Insecurity (05/05/2023)   Hunger Vital Sign    Worried About Running Out of Food in the Last Year: Never true    Ran Out of Food in the Last Year: Never true  Transportation Needs: No Transportation Needs (05/05/2023)   PRAPARE - Scientist, research (physical sciences) (Medical): No    Lack of Transportation (Non-Medical): No  Physical Activity: Not on file  Stress: Not on file  Social Connections: Not on file  Intimate Partner Violence: Not At Risk (05/05/2023)   Humiliation, Afraid, Rape, and Kick questionnaire    Fear of Current or Ex-Partner: No    Emotionally Abused: No    Physically Abused: No    Sexually Abused: No    ALLERGIES:    Allergies  Allergen Reactions   Erythromycin Shortness Of Breath and Swelling    "throat swelling", sob, thrashing ,    Penicillins Rash   Nyquil [Pseudoeph-Doxylamine-Dm-Apap] Rash   Sulfa Antibiotics Rash    CURRENT MEDICATIONS:    Current Facility-Administered Medications  Medication Dose Route Frequency Provider Last Rate Last Admin   [MAR Hold] acetaminophen (TYLENOL) tablet 650 mg  650 mg Oral Q6H PRN [name], MD   650 mg at 05/16/23 1413   Or   [MAR Hold] acetaminophen (TYLENOL) suppository 650 mg  650 mg Rectal Q6H PRN [name], MD       [MAR Hold] Chlorhexidine Gluconate Cloth 2 % PADS 6 each  6 each Topical Q0600 Anthony Sar, MD       [MAR Hold] Darbepoetin Alfa (ARANESP) injection 100 mcg  100 mcg Subcutaneous Q Wed-1800 Anthony Sar, MD       Mitzi Hansen Hold] senna-docusate (Senokot-S) tablet 1 tablet  1 tablet Oral QHS PRN [name], MD        REVIEW OF SYSTEMS:   [X]  denotes positive finding, [ ]  denotes negative finding Cardiac  Comments:  Chest pain or chest pressure:    Shortness of breath upon exertion:    Short of breath when lying flat: x   Irregular heart rhythm:        Vascular    Pain in calf, thigh, or hip brought on by ambulation:    Pain in feet at night that wakes you up from your sleep:     Blood clot in your veins:    Leg swelling:         Pulmonary    Oxygen at home:    Productive cough:     Wheezing:         Neurologic    Sudden weakness in arms or legs:     Sudden numbness in arms or legs:     Sudden onset of difficulty speaking or  slurred speech:    Temporary loss of vision in one eye:     Problems with dizziness:         Gastrointestinal  Blood in stool:      Vomited blood:         Genitourinary    Burning when urinating:     Blood in urine:        Psychiatric    Major depression:         Hematologic    Bleeding problems:    Problems with blood clotting too easily:        Skin    Rashes or ulcers:        Constitutional    Fever or chills:     PHYSICAL EXAM:   Vitals:   05/16/23 0654 05/16/23 0741 05/16/23 1100 05/16/23 1122  BP:  (!) 159/58 (!) 171/60   Pulse:  (!) 59 62   Resp:  17 15   Temp: 97.7 F (36.5 C)   97.6 F (36.4 C)  TempSrc:    Oral  SpO2:  96% 99%   Weight:      Height:        GENERAL: The patient is a well-nourished female, in no acute distress. The vital signs are documented above. CARDIAC: There is a regular rate and rhythm.  VASCULAR: Surreptitious course right arm fistula.  It is somewhat pulsatile.  Right arm is edematous PULMONARY: Nonlabored respirations ABDOMEN: Soft and non-tender MUSCULOSKELETAL: There are no major deformities or cyanosis. NEUROLOGIC: No focal weakness or paresthesias are detected. SKIN: There are no ulcers or rashes noted. PSYCHIATRIC: The patient has a normal affect.  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  She does have a peritoneal dialysis catheter in place but has elected not to pursue peritoneal dialysis.  This removed at a later date.  It does not appear that we will have time to do this in the operating today.  I told her she can eat right now and then be n.p.o. after the procedure will likely be this afternoon.   Charlena Cross, MD, FACS [facility] [phone] Pager [phone]

## 2023-05-16 NOTE — Consult Note (Signed)
NAME:  Elizabeth Olsen, MRN:  [id number], DOB:  1941-07-06, LOS: 0 ADMISSION DATE:  05/16/2023, CONSULTATION DATE:  05/16/23 REFERRING MD:  Danise Edge, CHIEF COMPLAINT:  pleural effusions    History of Present Illness:  82 yo F PMH ESRD tts iHD, CAD, HLD, HTN, DM, Diastolic HF, Afib on eliquis,  presented to ED 7/17 with SOB and RUE swelling. On arrival to ED, the patient had a CXR for evaluation of her SOB which revealed R basilar ASD and a R sided pleural effusion . VVS was consulted regarding the RUE swelling (has RUE fistula). 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   PCCM is consulted for evaluation of pleural effusion    Pertinent  Medical History  ESRD CAD HTN HLD DM Diastolic HF  Anemia Afib RVR Breast cancer s/p L mastectomy   Significant Hospital Events: Including procedures, antibiotic start and stop dates in addition to other pertinent events   7/17 admit to IMTS for SOB & Fistula malfunction, underwent fistulogram and subsequent balloon venoplasty of occluded innominate vein w successful recanalization. PCCM consulted for pleural effusion   Interim History / Subjective:  Seen in cath lab about intervention   Says " I never want to see any of you again"   Objective   Blood pressure (!) 171/60, pulse 62, temperature 97.6 F (36.4 C), temperature source Oral, resp. rate 15, height 5\' 2"  (1.575 m), weight 59 kg, SpO2 99%.       No intake or output data in the 24 hours ending 05/16/23 1714 Filed Weights   05/16/23 4098  Weight: 59 kg    Examination: General: chronically ill older adult F  HENT: tacky mm  Lungs: Symmetrical chest expansion, clear  Cardiovascular: cap refill < 3 sec  Abdomen: round  Neuro: Lethargic  GU: defer   Resolved Hospital Problem list     Assessment & Plan:    R pleural effusion  RLL ASD  -R effusion has incr in size from 7/4 -doesn't have a white count or fever, on CTA 7/4 had some bibasilar GGOs  P -will get an AM CXR -- have put in for endo case request for possible thora 7/18  -primary team is holding eliquis -IS, mobility   ESRD 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  -looks like nephro is planning for HD tonight, may help with resp sx as well as HTN  -post procedural recs per VVS   Encephalopathy, suspect medication related -- fent midaxz for procedure  -expect to clear   Rest per primary  Best Practice (right click and "Reselect all SmartList Selections" daily)   Per primary   Labs   CBC: Recent Labs  Lab 05/16/23 0610  WBC 6.8  NEUTROABS 4.2  HGB 8.9*  HCT 29.2*  MCV 100.0  PLT 236    Basic Metabolic Panel: Recent Labs  Lab 05/16/23 0610  NA 136  K 3.8  CL 93*  CO2 29  GLUCOSE 140*  BUN 12  CREATININE 2.66*  CALCIUM 8.0*   GFR: Estimated Creatinine Clearance: 13.1 mL/min (A) (by C-G formula based on SCr of 2.66 mg/dL (H)). Recent Labs  Lab 05/16/23 0610  WBC 6.8    Liver Function Tests: Recent Labs  Lab 05/16/23 0610  AST 22  ALT 11  ALKPHOS 69  BILITOT 0.4  PROT  6.2*  ALBUMIN 3.2*   No results for input(s): "LIPASE", "AMYLASE" in the last 168 hours. No results for input(s): "AMMONIA" in the last 168 hours.  ABG    Component Value Date/Time   TCO2 31 03/19/2020 0226     Coagulation Profile: No results for input(s): "INR", "PROTIME" in the last 168 hours.  Cardiac Enzymes: No results for input(s): "CKTOTAL", "CKMB", "CKMBINDEX", "TROPONINI" in the last 168 hours.  HbA1C: Hgb A1c MFr Bld  Date/Time Value Ref Range Status  11/20/2019 10:38 AM 9.0 (H) 4.8 - 5.6 % Final    Comment:    (NOTE) Pre diabetes:          5.7%-6.4% Diabetes:              >6.4% Glycemic control for   <7.0% adults with diabetes   01/18/2018 01:28 PM 9.5 (H) 4.8 - 5.6  % Final    Comment:    (NOTE) Pre diabetes:          5.7%-6.4% Diabetes:              >6.4% Glycemic control for   <7.0% adults with diabetes     CBG: No results for input(s): "GLUCAP" in the last 168 hours.  Review of Systems:   Limited due to medication related encephalopathy   Past Medical History:  She,  has a past medical history of Acquired hypothyroidism (10/13/2017), Acute diastolic CHF (congestive heart failure) (HCC) (11/20/2019), Acute respiratory failure with hypoxia (HCC) (10/13/2017), Acute systolic heart failure (HCC) (82/95/6213), Anemia, Anemia associated with diabetes mellitus (HCC) (10/13/2017), Anemia of chronic disease (12/10/2017), Blood transfusion, Blood transfusion without reported diagnosis, Breast cancer (HCC) (15 years ago), Cardiomyopathy (HCC) (07/23/2017), CHF (congestive heart failure) (HCC), Chronic cholecystitis with calculus (01/22/2018), Colon polyp (03/2007), COVID-19 virus infection (11/20/2019), Diabetes mellitus, Dyspnea, Essential hypertension (09/11/2018), GERD (gastroesophageal reflux disease), Heart murmur, Hernia, incisional, Hyperlipidemia, Hypertension, Hypothyroidism, Iron deficiency (07/23/2017), Near syncope (04/01/2018), Refractory anemia, unspecified (HCC) (12/10/2017), Type 1 diabetes (HCC) (07/23/2017), Vertigo (06/24/2018), and Vitamin D deficiency.   Surgical History:   Past Surgical History:  Procedure Laterality Date   ABDOMINAL HYSTERECTOMY     APPENDECTOMY     BREAST SURGERY     CHOLECYSTECTOMY  01/22/2018   LAPROSCOPIC    CHOLECYSTECTOMY N/A 01/22/2018   Procedure: LAPAROSCOPIC CHOLECYSTECTOMY WITH INTRAOPERATIVE CHOLANGIOGRAM;  Surgeon: Manus Rudd, MD;  Location: [facility] OR;  Service: General;  Laterality: N/A;   COLONOSCOPY  2013   due next 03-2017   EYE SURGERY     bil cataracts   IUD REMOVAL     with appendectomy   MASTECTOMY, MODIFIED RADICAL W/RECONSTRUCTION Left 15 years ago   10 nodes out   TUMOR EXCISION Left    x 2, neck,  head     Social History:   reports that she has never smoked. She has never used smokeless tobacco. She reports that she does not drink alcohol and does not use drugs.   Family History:  Her family history includes Congestive Heart Failure in her father; Diabetes in an other family member; Heart attack in her brother; Hypertension in her father, maternal grandfather, and maternal grandmother; Peripheral vascular disease in her father; Stomach cancer in her maternal grandmother. There is no history of Colon cancer, Esophageal cancer, Pancreatic cancer, or Rectal cancer.   Allergies Allergies  Allergen Reactions   Erythromycin Shortness Of Breath and Swelling    "throat swelling", sob, thrashing ,    Penicillins Rash   Nyquil [Pseudoeph-Doxylamine-Dm-Apap] Rash  Sulfa Antibiotics Rash     Home Medications  Prior to Admission medications   Medication Sig Start Date End Date Taking? Authorizing Provider  apixaban (ELIQUIS) 2.5 MG TABS tablet Take 1 tablet (2.5 mg total) by mouth 2 (two) times daily. 05/06/23  Yes Bender, Irving Burton, DO  cloNIDine (CATAPRES) 0.1 MG tablet Take 0.1 mg by mouth daily as needed (FOR SYSTOLIC BLOOD PRESSURE GREATER THAN 170 AND OR DIASTOLIC FOR BLOOD PRESSURE GREATER 100). 05/03/21  Yes [provider]  HUMALOG 100 UNIT/ML injection USE as directed via insulin pump. max total daily DOSE of 50 UNITS 02/12/20  Yes [provider]  insulin glargine (LANTUS SOLOSTAR) 100 UNIT/ML Solostar Pen Inject into the skin in the morning. Sliding scale 01/26/20  Yes [provider]  levothyroxine (SYNTHROID) 88 MCG tablet Take 1 tablet (88 mcg total) by mouth daily at 6 (six) AM. 05/07/23 06/06/23 Yes Morene Crocker, MD  lidocaine-prilocaine (EMLA) cream Apply topically. 04/07/23  Yes [provider]  metoprolol tartrate (LOPRESSOR) 25 MG tablet Take 1 tablet (25 mg total) by mouth 2 (two) times daily. (BETA BLOCKER) Patient taking differently: Take  25 mg by mouth 2 (two) times daily. 05/06/23 06/05/23 Yes Bender, Irving Burton, DO  atorvastatin (LIPITOR) 10 MG tablet TAKE 1 TABLET BY MOUTH EVERY DAY ONE TIME ONLY Patient not taking: Reported on 05/05/2023 05/19/20   Georgeanna Lea, MD  Continuous Blood Gluc Sensor (FREESTYLE LIBRE 14 DAY SENSOR) MISC See admin instructions. 10/22/19   [provider]  Insulin Disposable Pump (OMNIPOD DASH 5 PACK PODS) MISC SMARTSIG:SUB-Q Every 3 Days 05/11/20   [provider]  Letta Pate VERIO test strip USE TO TEST UP TO 10 TIMES D 08/24/17   [provider]     Critical care time: na     Tessie Fass MSN, AGACNP-BC [facility] Amion for pager 05/16/2023, 5:50 PM

## 2023-05-16 NOTE — Consult Note (Signed)
Vascular and [facility]in Specialist of [place]  Patient name: Elizabeth Olsen MRN: [id number] DOB: 1940-11-25 Sex: female   REQUESTING PROVIDER:    ER   REASON FOR CONSULT:    Dialysis access  HISTORY OF PRESENT ILLNESS:   Elizabeth Olsen is a 82 y.o. female, who came to the emergency department for shortness of breath.  She last had dialysis yesterday.  She had a fistula placed 3 years ago elsewhere.  She last had this intervened on May at a different institution.  She is moving back down here for care.  She has been complaining of upper arm swelling and right breast swelling.  This is the way she felt when she had her fistula intervened on.  Patient has a history of congestive heart failure.  She is a diabetic.  Patient is medically managed for hypertension.  Currently she takes a statin for hypercholesterolemia  PAST MEDICAL HISTORY    Past Medical History:  Diagnosis Date   Acquired hypothyroidism 10/13/2017   Acute diastolic CHF (congestive heart failure) (HCC) 11/20/2019   Acute respiratory failure with hypoxia (HCC) 10/13/2017   Acute systolic heart failure (HCC) 10/13/2017   Anemia    Anemia associated with diabetes mellitus (HCC) 10/13/2017   Anemia of chronic disease 12/10/2017   Blood transfusion    Blood transfusion without reported diagnosis    with breast surgery   Breast cancer (HCC) 15 years ago   left    Cardiomyopathy (HCC) 07/23/2017   Ejection fraction 45-50% may; from September 2018   CHF (congestive heart failure) (HCC)    Chronic cholecystitis with calculus 01/22/2018   Colon polyp 03/2007   adenomatous   COVID-19 virus infection 11/20/2019   Diabetes mellitus    dx 1998.  was told prior to getting chemo that her bld sugar rose.  She thought it would go back   Dyspnea    d/t anemia   Essential hypertension 09/11/2018   GERD (gastroesophageal reflux disease)    Heart murmur    as child   Hernia, incisional     Hyperlipidemia    Hypertension    Hypothyroidism    Iron deficiency 07/23/2017   Near syncope 04/01/2018   Refractory anemia, unspecified (HCC) 12/10/2017   Type 1 diabetes (HCC) 07/23/2017   Vertigo 06/24/2018   Vitamin D deficiency      FAMILY HISTORY   Family History  Problem Relation Age of Onset   Diabetes Other        both sides of family   Hypertension Father    Congestive Heart Failure Father    Peripheral vascular disease Father    Hypertension Maternal Grandfather    Hypertension Maternal Grandmother    Stomach cancer Maternal Grandmother        GGM   Heart attack Brother    Colon cancer Neg Hx    Esophageal cancer Neg Hx    Pancreatic cancer Neg Hx    Rectal cancer Neg Hx     SOCIAL HISTORY:   Social History   Socioeconomic History   Marital status: Married    Spouse name: Not on file   Number of children: 3   Years of education: Not on file   Highest education level: Not on file  Occupational History   Occupation: homemaker  Tobacco Use   Smoking status: Never   Smokeless tobacco: Never  Vaping Use   Vaping status: Never Used  Substance and Sexual Activity   Alcohol use: No  Drug use: No   Sexual activity: Not on file  Other Topics Concern   Not on file  Social History Narrative   Not on file   Social Determinants of Health   Financial Resource Strain: Not on file  Food Insecurity: No Food Insecurity (05/05/2023)   Hunger Vital Sign    Worried About Running Out of Food in the Last Year: Never true    Ran Out of Food in the Last Year: Never true  Transportation Needs: No Transportation Needs (05/05/2023)   PRAPARE - Administrator, Civil Service (Medical): No    Lack of Transportation (Non-Medical): No  Physical Activity: Not on file  Stress: Not on file  Social Connections: Not on file  Intimate Partner Violence: Not At Risk (05/05/2023)   Humiliation, Afraid, Rape, and Kick questionnaire    Fear of Current or Ex-Partner: No     Emotionally Abused: No    Physically Abused: No    Sexually Abused: No    ALLERGIES:    Allergies  Allergen Reactions   Erythromycin Shortness Of Breath and Swelling    "throat swelling", sob, thrashing ,    Penicillins Rash   Nyquil [Pseudoeph-Doxylamine-Dm-Apap] Rash   Sulfa Antibiotics Rash    CURRENT MEDICATIONS:    No current facility-administered medications for this encounter.   Current Outpatient Medications  Medication Sig Dispense Refill   apixaban (ELIQUIS) 2.5 MG TABS tablet Take 1 tablet (2.5 mg total) by mouth 2 (two) times daily. 60 tablet 0   atorvastatin (LIPITOR) 10 MG tablet TAKE 1 TABLET BY MOUTH EVERY DAY ONE TIME ONLY (Patient not taking: Reported on 05/05/2023) 90 tablet 2   cloNIDine (CATAPRES) 0.1 MG tablet Take 0.1 mg by mouth daily as needed (FOR SYSTOLIC BLOOD PRESSURE GREATER THAN 170 AND OR DIASTOLIC FOR BLOOD PRESSURE GREATER 100).     Continuous Blood Gluc Sensor (FREESTYLE LIBRE 14 DAY SENSOR) MISC See admin instructions.     HUMALOG 100 UNIT/ML injection USE as directed via insulin pump. max total daily DOSE of 50 UNITS     Insulin Disposable Pump (OMNIPOD DASH 5 PACK PODS) MISC SMARTSIG:SUB-Q Every 3 Days     insulin glargine (LANTUS SOLOSTAR) 100 UNIT/ML Solostar Pen Inject into the skin in the morning. Sliding scale     levothyroxine (SYNTHROID) 88 MCG tablet Take 1 tablet (88 mcg total) by mouth daily at 6 (six) AM. 30 tablet 0   lidocaine-prilocaine (EMLA) cream Apply topically.     metoprolol tartrate (LOPRESSOR) 25 MG tablet Take 1 tablet (25 mg total) by mouth 2 (two) times daily. (BETA BLOCKER) 60 tablet 0   ONETOUCH VERIO test strip USE TO TEST UP TO 10 TIMES D  12    REVIEW OF SYSTEMS:   [X]  denotes positive finding, [ ]  denotes negative finding Cardiac  Comments:  Chest pain or chest pressure:    Shortness of breath upon exertion:    Short of breath when lying flat: x   Irregular heart rhythm:        Vascular    Pain in calf,  thigh, or hip brought on by ambulation:    Pain in feet at night that wakes you up from your sleep:     Blood clot in your veins:    Leg swelling:         Pulmonary    Oxygen at home:    Productive cough:     Wheezing:  Neurologic    Sudden weakness in arms or legs:     Sudden numbness in arms or legs:     Sudden onset of difficulty speaking or slurred speech:    Temporary loss of vision in one eye:     Problems with dizziness:         Gastrointestinal    Blood in stool:      Vomited blood:         Genitourinary    Burning when urinating:     Blood in urine:        Psychiatric    Major depression:         Hematologic    Bleeding problems:    Problems with blood clotting too easily:        Skin    Rashes or ulcers:        Constitutional    Fever or chills:     PHYSICAL EXAM:   Vitals:   05/16/23 0602 05/16/23 0608 05/16/23 0654 05/16/23 0741  BP: (!) 164/54   (!) 159/58  Pulse: 60   (!) 59  Resp: 16   17  Temp: (!) 97.5 F (36.4 C)  97.7 F (36.5 C)   TempSrc: Oral     SpO2: 100%   96%  Weight:  59 kg    Height:  5\' 2"  (1.575 m)      GENERAL: The patient is a well-nourished female, in no acute distress. The vital signs are documented above. CARDIAC: There is a regular rate and rhythm.  VASCULAR: Surreptitious course right arm fistula.  It is somewhat pulsatile.  Right arm is edematous PULMONARY: Nonlabored respirations ABDOMEN: Soft and non-tender MUSCULOSKELETAL: There are no major deformities or cyanosis. NEUROLOGIC: No focal weakness or paresthesias are detected. SKIN: There are no ulcers or rashes noted. PSYCHIATRIC: The patient has a normal affect.  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  She does have a peritoneal dialysis catheter in place but has elected not to pursue peritoneal  dialysis.  This removed at a later date.  It does not appear that we will have time to do this in the operating today.  I told her she can eat right now and then be n.p.o. after the procedure will likely be this afternoon.   Charlena Cross, MD, FACS [facility] [phone] Pager [phone]

## 2023-05-16 NOTE — Op Note (Signed)
    Patient name: Elizabeth Olsen MRN: [id number] DOB: 07/26/1941 Sex: female  05/16/2023 Pre-operative Diagnosis: Fistula malfunction Post-operative diagnosis:  Same Surgeon:  Victorino Sparrow, MD Procedure Performed: 1.  Direct access of right arm fistula 2.  Fistulogram 3.  Right subclavian, right innominate vein, superior vena cava balloon venoplasty  8 x 80, 10 x 60 drug-coated balloon, 12 x 60 mm 4.  Access managed with Monocryl suture    Indications: Patient is an 82 year old female with end-stage renal disease currently undergoing dialysis.  She is accessed through a right arm fistula, and over the last several days has appreciated right upper extremity swelling extending into the axilla.  There is concern for central stenosis.  After discussing the risk and benefits of right upper extremity fistulogram, [name] elected to proceed.  Findings:  Widely patent fistula anastomosis, widely patent fistula Patent axillary vein, subclavian vein, occlusion of the right innominate vein.   Procedure: Patient was brought to the Cath Lab and laid in supine position.  Monitored anesthesia was induced and patient was prepped and draped in standard fashion.  A timeout was called.  An ultrasound was used to evaluate the right arm fistula.  This was accessed using a micropuncture needle and a wire run into the fistula.  Sheath was placed.  Fistulogram followed.  See results above.  I elected to attempt intervention on the right innominate vein occlusion.  A series of wires and catheters were used to cross the occlusion, and the wire was positioned in the inferior vena cava.  Next, the patient was heparinized, and an 8 x 80 mm balloon was brought onto the field and expanded from the subclavian into the superior cava.  This demonstrated significant improvement, therefore I elected to move to a 10 x 60 mm drug-coated balloon.  This was the largest drug-coated balloon in our stock.  Follow-up venography  demonstrated continued improvement, therefore I moved to a 12 x 80 mm balloon.  This was inflated for 2 minutes.  Follow-up venography demonstrated recanalization with less than 30% residual stenosis.  I felt this was an excellent result, therefore the case was terminated.  The access site was managed with a Monocryl suture.  Impression: Successful recanalization of the right innominate vein with balloon angioplasty.    Fara Olden, MD [facility] Office: [phone]

## 2023-05-16 NOTE — ED Notes (Signed)
 Admitting MD at bedside.

## 2023-05-16 NOTE — Progress Notes (Addendum)
Contacted by nephrologist regarding pt's need for in-center HD since pt has decided not to proceed with PD. Pt currently receiving HD in TCU at [facility] (Mon, Tues,Thurs,Fri). Spoke to [employer] who states that pt will likely transfer to in-center at [facility] and that clinic manager will need to assign chair time as able. Pt can return to TCU at d/c with in-center transfer to take place as clinic is able. Will also f/u with TCU staff and clinic manager tomorrow. Update provided to nephrologist. Will assist as needed.   Olivia Canter Renal Navigator [phone]

## 2023-05-17 ENCOUNTER — Inpatient Hospital Stay (HOSPITAL_COMMUNITY): Payer: 59

## 2023-05-17 ENCOUNTER — Encounter: Payer: 59 | Admitting: Student

## 2023-05-17 ENCOUNTER — Encounter (HOSPITAL_COMMUNITY)
Admission: EM | Disposition: A | Discharge status: Home Health | Payer: Self-pay | Source: Home / Self Care | Attending: Infectious Diseases

## 2023-05-17 ENCOUNTER — Encounter (HOSPITAL_COMMUNITY): Payer: Self-pay | Admitting: Vascular Surgery

## 2023-05-17 DIAGNOSIS — R251 Tremor, unspecified: Secondary | ICD-10-CM

## 2023-05-17 DIAGNOSIS — I5020 Unspecified systolic (congestive) heart failure: Secondary | ICD-10-CM

## 2023-05-17 DIAGNOSIS — I480 Paroxysmal atrial fibrillation: Secondary | ICD-10-CM

## 2023-05-17 DIAGNOSIS — R0600 Dyspnea, unspecified: Secondary | ICD-10-CM | POA: Diagnosis not present

## 2023-05-17 DIAGNOSIS — E1022 Type 1 diabetes mellitus with diabetic chronic kidney disease: Secondary | ICD-10-CM

## 2023-05-17 DIAGNOSIS — J90 Pleural effusion, not elsewhere classified: Secondary | ICD-10-CM

## 2023-05-17 DIAGNOSIS — J9601 Acute respiratory failure with hypoxia: Secondary | ICD-10-CM

## 2023-05-17 DIAGNOSIS — I82290 Acute embolism and thrombosis of other thoracic veins: Secondary | ICD-10-CM

## 2023-05-17 HISTORY — PX: THORACENTESIS: SHX235

## 2023-05-17 LAB — GLUCOSE, CAPILLARY
Glucose-Capillary: 338 mg/dL — ABNORMAL HIGH (ref 70–99)
Glucose-Capillary: 265 mg/dL — ABNORMAL HIGH (ref 70–99)
Glucose-Capillary: 256 mg/dL — ABNORMAL HIGH (ref 70–99)
Glucose-Capillary: 243 mg/dL — ABNORMAL HIGH (ref 70–99)
Glucose-Capillary: 211 mg/dL — ABNORMAL HIGH (ref 70–99)

## 2023-05-17 LAB — GRAM STAIN

## 2023-05-17 LAB — COMPREHENSIVE METABOLIC PANEL
ALT: 12 U/L (ref 0–44)
AST: 24 U/L (ref 15–41)
Albumin: 3.5 g/dL (ref 3.5–5.0)
Alkaline Phosphatase: 84 U/L (ref 38–126)
Anion gap: 19 — ABNORMAL HIGH (ref 5–15)
BUN: 5 mg/dL — ABNORMAL LOW (ref 8–23)
CO2: 21 mmol/L — ABNORMAL LOW (ref 22–32)
Calcium: 8.5 mg/dL — ABNORMAL LOW (ref 8.9–10.3)
Chloride: 93 mmol/L — ABNORMAL LOW (ref 98–111)
Creatinine, Ser: 1.61 mg/dL — ABNORMAL HIGH (ref 0.44–1.00)
GFR, Estimated: 32 mL/min — ABNORMAL LOW (ref >60)
Glucose, Bld: 193 mg/dL — ABNORMAL HIGH (ref 70–99)
Potassium: 3.9 mmol/L (ref 3.5–5.1)
Sodium: 133 mmol/L — ABNORMAL LOW (ref 135–145)
Total Bilirubin: 1.2 mg/dL (ref 0.3–1.2)
Total Protein: 6.9 g/dL (ref 6.5–8.1)

## 2023-05-17 LAB — GLUCOSE, PLEURAL OR PERITONEAL FLUID: Glucose, Fluid: 273 mg/dL

## 2023-05-17 LAB — BODY FLUID CELL COUNT WITH DIFFERENTIAL
Eos, Fluid: 0 %
Lymphs, Fluid: 63 %
Monocyte-Macrophage-Serous Fluid: 36 % — ABNORMAL LOW (ref 50–90)
Neutrophil Count, Fluid: 1 % (ref 0–25)
Total Nucleated Cell Count, Fluid: 173 cu mm (ref 0–1000)

## 2023-05-17 LAB — PROTEIN, PLEURAL OR PERITONEAL FLUID: Total protein, fluid: <3 g/dL

## 2023-05-17 LAB — CBC
HCT: 35.5 % — ABNORMAL LOW (ref 36.0–46.0)
Hemoglobin: 10.5 g/dL — ABNORMAL LOW (ref 12.0–15.0)
MCH: 30.2 pg (ref 26.0–34.0)
MCHC: 29.6 g/dL — ABNORMAL LOW (ref 30.0–36.0)
MCV: 102 fL — ABNORMAL HIGH (ref 80.0–100.0)
Platelets: 114 10*3/uL — ABNORMAL LOW (ref 150–400)
RBC: 3.48 MIL/uL — ABNORMAL LOW (ref 3.87–5.11)
RDW: 13.3 % (ref 11.5–15.5)
WBC: 10.7 10*3/uL — ABNORMAL HIGH (ref 4.0–10.5)
nRBC: 0 % (ref 0.0–0.2)

## 2023-05-17 LAB — HEPATITIS B SURFACE ANTIBODY, QUANTITATIVE: Hep B S AB Quant (Post): 47.4 m[IU]/mL

## 2023-05-17 LAB — PHOSPHORUS: Phosphorus: 2.3 mg/dL — ABNORMAL LOW (ref 2.5–4.6)

## 2023-05-17 LAB — LACTATE DEHYDROGENASE, PLEURAL OR PERITONEAL FLUID: LD, Fluid: 60 U/L — ABNORMAL HIGH (ref 3–23)

## 2023-05-17 SURGERY — THORACENTESIS
Anesthesia: Topical | Laterality: Right

## 2023-05-17 MED ORDER — INSULIN ASPART 100 UNIT/ML IJ SOLN
0.0000 [IU] | Freq: Every day | INTRAMUSCULAR | Status: DC
  Administered 2023-05-17: 2 [IU] via SUBCUTANEOUS

## 2023-05-17 MED ORDER — INSULIN GLARGINE-YFGN 100 UNIT/ML ~~LOC~~ SOLN
10.0000 [IU] | Freq: Every day | SUBCUTANEOUS | Status: DC
  Administered 2023-05-17: 10 [IU] via SUBCUTANEOUS
  Filled 2023-05-17 (×2): qty 0.1

## 2023-05-17 MED ORDER — IPRATROPIUM-ALBUTEROL 0.5-2.5 (3) MG/3ML IN SOLN: 3.0000 mL | RESPIRATORY_TRACT | Status: DC | PRN

## 2023-05-17 MED ORDER — IPRATROPIUM-ALBUTEROL 0.5-2.5 (3) MG/3ML IN SOLN
3.0000 mL | Freq: Four times a day (QID) | RESPIRATORY_TRACT | Status: DC
  Administered 2023-05-17 – 2023-05-18 (×4): 3 mL via RESPIRATORY_TRACT
  Filled 2023-05-17 (×4): qty 3

## 2023-05-17 MED ORDER — INSULIN ASPART 100 UNIT/ML IJ SOLN
0.0000 [IU] | Freq: Three times a day (TID) | INTRAMUSCULAR | Status: DC
  Administered 2023-05-17 – 2023-05-18 (×2): 2 [IU] via SUBCUTANEOUS

## 2023-05-17 MED ORDER — APIXABAN 2.5 MG PO TABS
2.5000 mg | ORAL_TABLET | Freq: Two times a day (BID) | ORAL | Status: DC
  Administered 2023-05-17 (×2): 2.5 mg via ORAL
  Filled 2023-05-17 (×2): qty 1

## 2023-05-17 MED ORDER — INSULIN ASPART 100 UNIT/ML IJ SOLN
2.0000 [IU] | Freq: Three times a day (TID) | INTRAMUSCULAR | Status: DC
  Administered 2023-05-17 – 2023-05-18 (×2): 2 [IU] via SUBCUTANEOUS

## 2023-05-17 MED ORDER — INSULIN GLARGINE-YFGN 100 UNIT/ML ~~LOC~~ SOLN
14.0000 [IU] | Freq: Every day | SUBCUTANEOUS | Status: DC
  Filled 2023-05-17: qty 0.14

## 2023-05-17 MED ORDER — NYSTATIN 100000 UNIT/GM EX POWD
Freq: Two times a day (BID) | CUTANEOUS | Status: DC
  Filled 2023-05-17: qty 15

## 2023-05-17 MED FILL — Heparin Sodium (Porcine) Inj 1000 Unit/ML: INTRAMUSCULAR | Qty: 10 | Status: AC

## 2023-05-17 NOTE — Progress Notes (Signed)
[facility] Progress Note   Subjective:   Patient seen and examined at bedside.  Reports initial improvement in breathing post dialysis but has now become short of breath again.  Plan for thoracentesis this AM.    Objective Vitals:   05/17/23 0035 05/17/23 0129 05/17/23 0527 05/17/23 0741  BP: (!) 112/55 (!) 117/44 (!) 120/47 (!) 132/46  Pulse: 81 87 92 92  Resp: 20 18 18 20   Temp:  98 F (36.7 C) 98.2 F (36.8 C) 97.8 F (36.6 C)  TempSrc:  Oral Oral Oral  SpO2: 100% 100% 100% 100%  Weight:   59.4 kg   Height:       Physical Exam General:pleasant, alert female in NAD Heart:RRR, no mrg Lungs:decreased breath sounds on R, +crackles on L, nml WOB on 4L O2 Abdomen:soft, NT, no edema Extremities trace LE edema, RUE edema Dialysis Access: RU AVF +b/t, PD cath in place  [facility] Weights   05/16/23 0608 05/17/23 0527  Weight: 59 kg 59.4 kg    Intake/Output Summary (Last 24 hours) at 05/17/2023 0934 Last data filed at 05/17/2023 0035 Gross per 24 hour  Intake --  Output 4800 ml  Net -4800 ml    Additional Objective Labs: Basic Metabolic Panel: Recent Labs  Lab 05/16/23 0610 05/17/23 0145  NA 136 133*  K 3.8 3.9  CL 93* 93*  CO2 29 21*  GLUCOSE 140* 193*  BUN 12 5*  CREATININE 2.66* 1.61*  CALCIUM 8.0* 8.5*   Liver Function Tests: Recent Labs  Lab 05/16/23 0610 05/17/23 0145  AST 22 24  ALT 11 12  ALKPHOS 69 84  BILITOT 0.4 1.2  PROT 6.2* 6.9  ALBUMIN 3.2* 3.5   CBC: Recent Labs  Lab 05/16/23 0610 05/17/23 0145  WBC 6.8 10.7*  NEUTROABS 4.2  --   HGB 8.9* 10.5*  HCT 29.2* 35.5*  MCV 100.0 102.0*  PLT 236 114*   CBG: Recent Labs  Lab 05/16/23 1800 05/16/23 1948 05/17/23 0739 05/17/23 0918  GLUCAP 137* 186* 338* 265*   Studies/Results: DG Chest Port 1 View  Result Date: 05/17/2023 CLINICAL DATA:  Shortness of breath, pleural effusion EXAM: PORTABLE CHEST 1 VIEW COMPARISON:  Chest radiograph 1 day prior FINDINGS: The  cardiomediastinal silhouette is stable The right pleural effusion and adjacent airspace opacity are not significantly changed. There is no new or worsening focal airspace opacity. There is no left pleural effusion. There is no pneumothorax There is no acute osseous abnormality. Left breast and axillary surgical clips are again noted. IMPRESSION: Unchanged right pleural effusion and adjacent airspace opacity. Electronically Signed   By: Lesia Hausen M.D.   On: 05/17/2023 08:39   PERIPHERAL VASCULAR CATHETERIZATION  Result Date: 05/16/2023 Images from the original result were not included. Patient name: TIMBERLYNN KIZZIAH MRN: [id number] DOB: [birth year] Sex: female 05/16/2023 Pre-operative Diagnosis: Fistula malfunction Post-operative diagnosis:  Same Surgeon:  Victorino Sparrow, MD Procedure Performed: 1.  Direct access of right arm fistula 2.  Fistulogram 3.  Right subclavian, right innominate vein, superior vena cava balloon venoplasty  8 x 80, 10 x 60 drug-coated balloon, 12 x 60 mm 4.  Access managed with Monocryl suture Indications: Patient is an 82 year old female with end-stage renal disease currently undergoing dialysis.  She is accessed through a right arm fistula, and over the last several days has appreciated right upper extremity swelling extending into the axilla.  There is concern for central stenosis.  After discussing the risk and benefits of right  upper extremity fistulogram, [name] elected to proceed. Findings: Widely patent fistula anastomosis, widely patent fistula Patent axillary vein, subclavian vein, occlusion of the right innominate vein.  Procedure: Patient was brought to the Cath Lab and laid in supine position.  Monitored anesthesia was induced and patient was prepped and draped in standard fashion.  A timeout was called.  An ultrasound was used to evaluate the right arm fistula.  This was accessed using a micropuncture needle and a wire run into the fistula.  Sheath was placed.  Fistulogram  followed.  See results above.  I elected to attempt intervention on the right innominate vein occlusion.  A series of wires and catheters were used to cross the occlusion, and the wire was positioned in the inferior vena cava.  Next, the patient was heparinized, and an 8 x 80 mm balloon was brought onto the field and expanded from the subclavian into the superior cava.  This demonstrated significant improvement, therefore I elected to move to a 10 x 60 mm drug-coated balloon.  This was the largest drug-coated balloon in our stock.  Follow-up venography demonstrated continued improvement, therefore I moved to a 12 x 80 mm balloon.  This was inflated for 2 minutes.  Follow-up venography demonstrated recanalization with less than 30% residual stenosis.  I felt this was an excellent result, therefore the case was terminated.  The access site was managed with a Monocryl suture. Impression: Successful recanalization of the right innominate vein with balloon angioplasty. Fara Olden, MD [facility] Office: [phone]   ECHOCARDIOGRAM COMPLETE  Result Date: 05/16/2023    ECHOCARDIOGRAM REPORT   Patient Name:   CHRISTYANN MANOLIS [facility] Date of Exam: 05/16/2023 Medical Rec #:  [id number]      Height:       62.0 in Accession #:    [id number]     Weight:       130.0 lb Date of Birth:  01/20/1941       BSA:          1.592 m Patient Age:    82 years       BP:           191/68 mmHg Patient Gender: F              HR:           58 bpm. Exam Location:  Inpatient Procedure: 2D Echo, Cardiac Doppler and Color Doppler Indications:    CHF, ESRD  History:        Patient has prior history of Echocardiogram examinations.  Sonographer:    Wallie Char Referring Phys: [id number] CHRISTOPHER J TEGELER IMPRESSIONS  1. Left ventricular ejection fraction, by estimation, is 50 to 55%. The left ventricle has low normal function. The left ventricle has no regional wall motion abnormalities. There is mild concentric left  ventricular hypertrophy. Left ventricular diastolic parameters are consistent with Grade III diastolic dysfunction (restrictive).  2. Right ventricular systolic function is normal. The right ventricular size is normal. There is severely elevated pulmonary artery systolic pressure. The estimated right ventricular systolic pressure is 69.2 mmHg.  3. Left atrial size was mildly dilated.  4. The mitral valve is abnormal. Mild to moderate mitral valve regurgitation. No evidence of mitral stenosis. Moderate mitral annular calcification.  5. The aortic valve was not well visualized. There is moderate calcification of the aortic valve. Aortic valve regurgitation is mild. Aortic valve sclerosis/calcification is present, without any evidence of aortic stenosis. Aortic  valve mean gradient measures 8.0 mmHg.  6. The inferior vena cava is normal in size with <50% respiratory variability, suggesting right atrial pressure of 8 mmHg. FINDINGS  Left Ventricle: Left ventricular ejection fraction, by estimation, is 50 to 55%. The left ventricle has low normal function. The left ventricle has no regional wall motion abnormalities. The left ventricular internal cavity size was normal in size. There is mild concentric left ventricular hypertrophy. Left ventricular diastolic parameters are consistent with Grade III diastolic dysfunction (restrictive). Right Ventricle: The right ventricular size is normal. No increase in right ventricular wall thickness. Right ventricular systolic function is normal. There is severely elevated pulmonary artery systolic pressure. The tricuspid regurgitant velocity is 3.91 m/s, and with an assumed right atrial pressure of 8 mmHg, the estimated right ventricular systolic pressure is 69.2 mmHg. Left Atrium: Left atrial size was mildly dilated. Right Atrium: Right atrial size was normal in size. Pericardium: Trivial pericardial effusion is present. Mitral Valve: The mitral valve is abnormal. There is mild  calcification of the mitral valve leaflet(s). Moderate mitral annular calcification. Mild to moderate mitral valve regurgitation. No evidence of mitral valve stenosis. MV peak gradient, 8.9 mmHg. The mean mitral valve gradient is 3.0 mmHg. Tricuspid Valve: The tricuspid valve is normal in structure. Tricuspid valve regurgitation is mild. Aortic Valve: The aortic valve was not well visualized. There is moderate calcification of the aortic valve. Aortic valve regurgitation is mild. Aortic regurgitation PHT measures 501 msec. Aortic valve sclerosis/calcification is present, without any evidence of aortic stenosis. Aortic valve mean gradient measures 8.0 mmHg. Aortic valve peak gradient measures 14.3 mmHg. Aortic valve area, by VTI measures 1.80 cm. Pulmonic Valve: The pulmonic valve was normal in structure. Pulmonic valve regurgitation is trivial. Aorta: The aortic root is normal in size and structure. Venous: The inferior vena cava is normal in size with less than 50% respiratory variability, suggesting right atrial pressure of 8 mmHg. IAS/Shunts: No atrial level shunt detected by color flow Doppler.  LEFT VENTRICLE PLAX 2D LVIDd:         4.90 cm      Diastology LVIDs:         3.60 cm      LV e' medial:    3.87 cm/s LV PW:         1.00 cm      LV E/e' medial:  37.5 LV IVS:        0.90 cm      LV e' lateral:   5.65 cm/s LVOT diam:     1.90 cm      LV E/e' lateral: 25.7 LV SV:         86 LV SV Index:   54 LVOT Area:     2.84 cm  LV Volumes (MOD) LV vol d, MOD A2C: 92.4 ml LV vol d, MOD A4C: 107.0 ml LV vol s, MOD A2C: 50.8 ml LV vol s, MOD A4C: 53.1 ml LV SV MOD A2C:     41.6 ml LV SV MOD A4C:     107.0 ml LV SV MOD BP:      50.5 ml RIGHT VENTRICLE             IVC RV Basal diam:  3.90 cm     IVC diam: 1.80 cm RV S prime:     12.70 cm/s TAPSE (M-mode): 2.5 cm LEFT ATRIUM             Index  RIGHT ATRIUM           Index LA diam:        4.30 cm 2.70 cm/m   RA Area:     14.80 cm LA Vol (A2C):   49.9 ml 31.35 ml/m   RA Volume:   39.40 ml  24.75 ml/m LA Vol (A4C):   53.5 ml 33.61 ml/m LA Biplane Vol: 52.1 ml 32.73 ml/m  AORTIC VALVE AV Area (Vmax):    1.73 cm AV Area (Vmean):   1.86 cm AV Area (VTI):     1.80 cm AV Vmax:           189.00 cm/s AV Vmean:          123.500 cm/s AV VTI:            0.479 m AV Peak Grad:      14.3 mmHg AV Mean Grad:      8.0 mmHg LVOT Vmax:         115.50 cm/s LVOT Vmean:        81.050 cm/s LVOT VTI:          0.303 m LVOT/AV VTI ratio: 0.63 AI PHT:            501 msec  AORTA Ao Root diam: 2.90 cm Ao Asc diam:  3.20 cm MITRAL VALVE                TRICUSPID VALVE MV Area (PHT): 5.23 cm     TR Peak grad:   61.2 mmHg MV Area VTI:   2.10 cm     TR Vmax:        391.00 cm/s MV Peak grad:  8.9 mmHg MV Mean grad:  3.0 mmHg     SHUNTS MV Vmax:       1.49 m/s     Systemic VTI:  0.30 m MV Vmean:      82.4 cm/s    Systemic Diam: 1.90 cm MV Decel Time: 145 msec MV E velocity: 145.00 cm/s MV A velocity: 78.00 cm/s MV E/A ratio:  1.86 Dalton McleanMD Electronically signed by Wilfred Lacy Signature Date/Time: 05/16/2023/3:31:12 PM    Final    DG Chest 2 View  Result Date: 05/16/2023 CLINICAL DATA:  Shortness of breath. EXAM: CHEST - 2 VIEW COMPARISON:  05/05/2023 chest x-ray.  Chest CT 05/05/2023. FINDINGS: The cardio pericardial silhouette is enlarged. Left lung clear. New consolidative airspace disease at the right base with small to moderate right pleural effusion. Bones are diffusely demineralized. IMPRESSION: Consolidative airspace disease at the right base with small to moderate right pleural effusion is progressive since prior CT of 05/05/2023. Electronically Signed   By: Kennith Center M.D.   On: 05/16/2023 07:00    Medications:   atorvastatin  10 mg Oral Daily   Chlorhexidine Gluconate Cloth  6 each Topical Q0600   darbepoetin (ARANESP) injection - DIALYSIS  100 mcg Subcutaneous Q Wed-1800   insulin aspart  0-15 Units Subcutaneous TID WC   insulin aspart  0-5 Units Subcutaneous QHS    insulin glargine-yfgn  10 Units Subcutaneous Daily   ipratropium-albuterol  3 mL Nebulization Q6H   levothyroxine  88 mcg Oral Q0600   metoprolol tartrate  25 mg Oral BID    Dialysis Orders: [facility]/TCU. 4 times a week (M, Tu, Th, F). 3 hours. 2K/2 calcium. EDW 68kg. F180. Flow rates: 400/800. RUE AVF. Meds: Mircera 100 mcg every 2 weeks (last dose of ESA 6/11 and was 30 mcg),  no heparin reported   Assessment/Plan: 1. AHRF, pleural effusion - HD overnight w/4.8L removed, CXR grossly unchanged, plan for thoracentesis today.  If ongoing SOB post thora may need short HD again tonight.   2. ESRD - Plan to run MWF while here.  To transition to in center HD which can be completed as outpatient.  Can return to [facility] for now.  3. Anemia of CKD- Hgb 10.5. No ESA needs for now.  4. Secondary hyperparathyroidism - CCa ok. Checking phos.  Usually on Velphoro.  Continue VDRA. 5. HTN/volume - Blood pressure in goal.  Volume overloaded.  Will need lower dry weight on discharge.  6. Nutrition - Renal diet with fluid restrictions.   Virgina Norfolk, PA-C [facility] 05/17/2023,9:34 AM  LOS: 1 day

## 2023-05-17 NOTE — Progress Notes (Signed)
05/17/2023     I have seen and evaluated the patient for pleural effusion.   S:  Ongoing DOE despite HD yesterday. No hx of recent trauma/PNA etc No pleurisy   O: Blood pressure 96/66, pulse 64, temperature 97.6 F (36.4 C), temperature source Oral, resp. rate 17, height 5\' 3"  (1.6 m), weight 81.6 kg, SpO2 94 %.   No distress Lung sounds reduced R base Ext warm Moves to command RASS 0   A:  R pleural effusion NOS in ESRD patient   P:  - Diagnostic/therapeutic tap - iHD if this does not relieve dyspnea    Myrla Halsted MD [facility] Prefer epic messenger for cross cover needs If after hours, please call E-link

## 2023-05-17 NOTE — Progress Notes (Addendum)
Subjective:  Elizabeth Olsen is an 82 year old woman with a past medical history of ESRD on HD, HFeEF, T1DM, CAD, and hypothyroidism who presented to the emergency department with SOB and right upper extremity edema. A fistulogram showed occulusion of right innominate vein that was recanalized. CXR showed R pleural effusion progression along with  new consolidative airspace disease at the right base which did not improve after dialysis last evening.   Overnight events: removed 4.8 L with HD  Today, she reported her breathing feeling "so/so" and has yet to improve. Her right arm swelling has improved according to both the patient and her daughter. She denies chest pain, fevers,coughing, and chocking while eating but does feel that she has to drink water after she eats to " make it easier to swallow".    Objective:  Vital signs in last 24 hours: Vitals:   05/17/23 0035 05/17/23 0129 05/17/23 0527 05/17/23 0741  BP: (!) 112/55 (!) 117/44 (!) 120/47 (!) 132/46  Pulse: 81 87 92 92  Resp: 20 18 18 20   Temp:  98 F (36.7 C) 98.2 F (36.8 C) 97.8 F (36.6 C)  TempSrc:  Oral Oral Oral  SpO2: 100% 100% 100% 100%  Weight:   59.4 kg   Height:       Physical Exam:  General:Acutely ill appearing female Cardiac:RRR, NSR Pulmonary:Rhonchi ausculted in right lung, left lung clear to auscultate  MWN:UUVOZ arm is not edematous on exam, fistula is present and covered  Skin:Warm and dry Neuro:alert, tremor noted in right extremity  Psych: normal mood and affect     Latest Ref Rng & Units 05/17/2023    1:45 AM 05/16/2023    6:10 AM 05/06/2023    1:10 AM  CBC  WBC 4.0 - 10.5 K/uL 10.7  6.8  7.5   Hemoglobin 12.0 - 15.0 g/dL 36.6  8.9  8.5   Hematocrit 36.0 - 46.0 % 35.5  29.2  27.2   Platelets 150 - 400 K/uL 114  236  255        Latest Ref Rng & Units 05/17/2023    1:45 AM 05/16/2023    6:10 AM 05/06/2023    1:10 AM  CMP  Glucose 70 - 99 mg/dL 440  347  425   BUN 8 - 23 mg/dL 5  12  29     Creatinine 0.44 - 1.00 mg/dL 9.56  3.87  5.64   Sodium 135 - 145 mmol/L 133  136  132   Potassium 3.5 - 5.1 mmol/L 3.9  3.8  3.8   Chloride 98 - 111 mmol/L 93  93  96   CO2 22 - 32 mmol/L 21  29  28    Calcium 8.9 - 10.3 mg/dL 8.5  8.0  8.4   Total Protein 6.5 - 8.1 g/dL 6.9  6.2    Total Bilirubin 0.3 - 1.2 mg/dL 1.2  0.4    Alkaline Phos 38 - 126 U/L 84  69    AST 15 - 41 U/L 24  22    ALT 0 - 44 U/L 12  11      Assessment/Plan:  Principal Problem:   Acute dyspnea  Acute hypoxic respiratory failure  ESRD HFrEF Patient has a chronic R plureral effusion. CXR shows R pleural effusion progression along with  new consolidative airspace disease at the right base. Due to a concern for fluid overload from ESRD causing acute hypoxic respiratory failure, she recieved dialysis yesterday evening where they  removed 4.8L. Repeat CXR this am does not show improvement and patient is still reporting difficulty breathing that has not improved since receiving dialysis. -PCCM consulted: will follow up on their recommendations: thoracentesis today -Patient's daughter stated that she has a difficulty with swallowing and feels that "the food gets stuck/ doesn't go down easily": SLP consulted -Nephrology consulted, follow dialysis schedule per recommendations -Due to lack of fevers,leukocytosis, and cough, will hold off on antibiotics for the time being  Right Innominate Vein occlusion Patient presented with RUE edema thought to be due to a malfunction with her AV fistula. Vascular surgery performed a fistulogram, they found a right innominate vein occlusion that was recanalized with balloon angioplasty. AV fistula was widely patent and did not require intervention. -RUE edema improved on exam today -Continue to follow vascular surgery recommendations  T1DM Patient began Semglee 10 units this morning SSI ordered   Paroxsymal Atrial Fibrillation Patient is in NSR during exam Continue telemetry  monitoring Continue Lopressor 25mg  BID, monitor BP HOLD eliquis for thoracentesis today   Tremor Right upper extremity observed, patient report it as new -Will monitor for now   Prior to Admission Living Arrangement:Home with daughter Anticipated Discharge Location:Home with daughter Barriers to Discharge:Medical treatment  Dispo: Anticipated discharge in approximately 2-3 day(s).   Faith Rogue, DO 05/17/2023, 9:03 AM Pager: [phone] After 5pm on weekdays and 1pm on weekends: On Call pager [phone]

## 2023-05-17 NOTE — Plan of Care (Signed)
Elizabeth Olsen September 09, 1941 [id number]   Patient reports breathing much better post procedure.  Able to ambulate with PT earlier.  In agreement to hold on HD until tomorrow AM.    Virgina Norfolk, PA-C [facility]

## 2023-05-17 NOTE — Hospital Course (Addendum)
Acute dyspnea secondary to R pleural effusion ESRD HFrEF Pt presented with dyspnea not improved following 4.3L fluid removal during dialysis on Wed 7/17. CXR showed R pleural effusion progression along with new consolidative airspace disease at the right base. Thoracentesis done 7/18 with 750cc straw-colored fluid removed, likely transudate given LDH < 2/3 upper limit of normal serum LDH level, protein < 3.0 g/dL; triglycerides still pending. No indication for antibiotics. Dyspnea improved following. Echocardiogram showed LVEF of 50-55%, grade III diastolic dysfunction, with severely elevated pulmonary artery systolic pressure. Pt received dialysis again Fri 7/19 with 4.5 L fluid removed. Once discharged, pt has been given a MWF 12:20 chair time at [facility] and will need to arrive at 12:00 for dialysis.   Right Innominate Vein occlusion Patient presented with RUE edema thought to be due to a malfunction with her AV fistula. Vascular surgery performed a fistulogram on 7/17, they found a right innominate vein occlusion that was recanalized with balloon angioplasty. AV fistula was widely patent and did not require intervention.   Paroxsymal Atrial Fibrillation Patient remained in NSR during hospitalization. Rate/rhythm controlled with Lopressor 12.5 mg BID, decreased from 25 mg BID during hospitalization given low diastolic BP readings. Stroke prophylaxis with Eliquis 2.5 mg BID.  T1DM At the hospital, pt was on Semglee 14 units daily, Novolog 2 units TID, SSI. She will resume her Dexcom and home regimen at discharge.

## 2023-05-17 NOTE — Progress Notes (Addendum)
Contacted [facility] and TCU staff this morning to inquire if pt will resume at TCU at d/c until in-center chair available or if there is an in-center chair available that pt can start in at d/c. Will await response. Will assist as needed.   Olivia Canter Renal Navigator [phone]  Addendum at 12:17 pm: Spoke to clinic manager at [facility]. Pt has been given a MWF 12:20 chair time and pt will need to arrive at 12:00. Pt can start this new schedule on Monday. Message left for pt's daughter requesting a return call to discuss above information. Update provided to nephrologist and renal PA regarding arrangements and that clinic is requesting new orders at d/c. Arrangements added to AVS as well. TCU RN aware of new in-center appt as well.

## 2023-05-17 NOTE — Progress Notes (Addendum)
   05/17/23 0035  Vitals  BP (!) 112/55  MAP (mmHg) 81  BP Location Right Arm  BP Method Automatic  Patient Position (if appropriate) Lying  Pulse Rate 81  Pulse Rate Source Monitor  ECG Heart Rate 82  Resp 20  Oxygen Therapy  SpO2 100 %  O2 Device Nasal Cannula  O2 Flow Rate (L/min) 4 L/min  During Treatment Monitoring  Intra-Hemodialysis Comments Tx completed  Post Treatment  Dialyzer Clearance Lightly streaked  Duration of HD Treatment -hour(s) 3.5 hour(s)  Hemodialysis Intake (mL) 0 mL  Liters Processed 80  Fluid Removed (mL) 4800 mL  Tolerated HD Treatment Yes  Post-Hemodialysis Comments pt stable  AVG/AVF Arterial Site Held (minutes) 5 minutes  AVG/AVF Venous Site Held (minutes) 5 minutes  Fistula / Graft Right Forearm Arteriovenous fistula  Placement Date: 02/09/21   Orientation: Right  Access Location: Forearm  Access Type: Arteriovenous fistula  Site Condition No complications  Fistula / Graft Assessment Thrill;Bruit;Present  Status Deaccessed  Needle Size 15   Pt stable Cao x 4 Report given to Lisabeth Pick

## 2023-05-17 NOTE — Inpatient Diabetes Management (Signed)
Inpatient Diabetes Program Recommendations  AACE/ADA: New Consensus Statement on Inpatient Glycemic Control (2015)  Target Ranges:  Prepandial:   less than 140 mg/dL      Peak postprandial:   less than 180 mg/dL (1-2 hours)      Critically ill patients:  140 - 180 mg/dL   Lab Results  Component Value Date   GLUCAP 256 (H) 05/17/2023   HGBA1C 9.0 (H) 11/20/2019    Review of Glycemic Control  Latest Reference Range & Units 05/16/23 18:00 05/16/23 19:48 05/17/23 07:39 05/17/23 09:18 05/17/23 12:40  Glucose-Capillary 70 - 99 mg/dL 130 (H) 865 (H) 784 (H) 265 (H) 256 (H)   Diabetes history: DM 1 (makes no insulin-Needs basal, bolus and correction insulin) Outpatient Diabetes medications:  Omnipod insulin pump- Basal rates: 12a 0.45 units/hr 7a- 0.6 units/hr Goal is 130 mg/dL- correction ONGEXB=28 mg/dL, 1 UXLK/44 grams of CHO Current orders for Inpatient glycemic control:  Novolog 0-15 units tid with meals and HS Semglee 10 units daily (0800)  Inpatient Diabetes Program Recommendations:    Visited with patient and daughter at bedside. Confirmed insulin pump settings per above.  Pump was removed last night and no basal administered prior to removal which likely contributed to higher blood sugars this AM.  Note that Lantus added this AM.  Per daughter patient is not fully independent with pump and therefore likely not appropriate for insulin pump use in the hospital per policy.    -Consider increasing Semglee to 14 units daily, reduce Novolog correction to very sensitive (0-6 units) and add Novolog meal coverage 3 units tid with meals (hold if patient eats less than 50%, NPO, or CBG<80 mg/dL).  DM coordinator consult placed for team to follow and share recommendation with resident.   Thanks,  Beryl Meager, RN, BC-ADM Inpatient Diabetes Coordinator Pager [phone] (8a-5p)

## 2023-05-17 NOTE — Progress Notes (Signed)
  Progress Note    05/17/2023 7:56 AM 1 Day Post-Op  Subjective:  short of breath this morning    Vitals:   05/17/23 0527 05/17/23 0741  BP: (!) 120/47 (!) 132/46  Pulse: 92 92  Resp: 18 20  Temp: 98.2 F (36.8 C) 97.8 F (36.6 C)  SpO2: 100% 100%   Physical Exam: Cardiac:  regular Lungs:  4L [place], non labored Extremities:  right AV fistula with good thrill, 2+ radial pulse Abdomen:  soft Neurologic: alert and oriented  CBC    Component Value Date/Time   WBC 10.7 (H) 05/17/2023 0145   RBC 3.48 (L) 05/17/2023 0145   HGB 10.5 (L) 05/17/2023 0145   HGB 10.8 (L) 07/07/2020 0938   HGB 10.9 (L) 03/17/2020 1134   HGB 12.3 02/14/2007 0909   HCT 35.5 (L) 05/17/2023 0145   HCT 34.3 03/17/2020 1134   HCT 35.6 02/14/2007 0909   PLT 114 (L) 05/17/2023 0145   PLT 266 07/07/2020 0938   PLT 505 (H) 03/17/2020 1134   MCV 102.0 (H) 05/17/2023 0145   MCV 89 03/17/2020 1134   MCV 87.5 02/14/2007 0909   MCH 30.2 05/17/2023 0145   MCHC 29.6 (L) 05/17/2023 0145   RDW 13.3 05/17/2023 0145   RDW 15.0 03/17/2020 1134   RDW 13.1 02/14/2007 0909   LYMPHSABS 1.2 05/16/2023 0610   LYMPHSABS 2.4 02/14/2007 0909   MONOABS 1.2 (H) 05/16/2023 0610   MONOABS 0.7 02/14/2007 0909   EOSABS 0.3 05/16/2023 0610   EOSABS 0.2 02/14/2007 0909   BASOSABS 0.0 05/16/2023 0610   BASOSABS 0.0 02/14/2007 0909    BMET    Component Value Date/Time   NA 133 (L) 05/17/2023 0145   NA 130 (L) 05/21/2020 0952   K 3.9 05/17/2023 0145   CL 93 (L) 05/17/2023 0145   CO2 21 (L) 05/17/2023 0145   GLUCOSE 193 (H) 05/17/2023 0145   BUN 5 (L) 05/17/2023 0145   BUN 38 (H) 05/21/2020 0952   CREATININE 1.61 (H) 05/17/2023 0145   CREATININE 2.00 (H) 02/10/2020 0925   CALCIUM 8.5 (L) 05/17/2023 0145   GFRNONAA 32 (L) 05/17/2023 0145   GFRNONAA 23 (L) 02/10/2020 0925   GFRAA 22 (L) 05/21/2020 0952   GFRAA 27 (L) 02/10/2020 0925    INR No results found for: "INR"   Intake/Output Summary (Last 24 hours) at  05/17/2023 0756 Last data filed at 05/17/2023 0035 Gross per 24 hour  Intake --  Output 4800 ml  Net -4800 ml     Assessment/Plan:  82 y.o. female is s/p Fistulogram, Right subclavian, right innominate vein and SVC balloon venoplasty 1 Day Post-Op   Right arm well perfused and warm with palpable radial pulse Swelling of right arm improving Right arm fistula widely patent with good thrill Having continued SOB. Had HD overnight with little improvement. Possible therapeutic Thoracentesis today per primary team Will defer to Dr. Colin Benton regarding timing of PD catheter removal   Graceann Congress, PA-C [facility] [phone] 05/17/2023 7:56 AM

## 2023-05-17 NOTE — Evaluation (Signed)
Physical Therapy Evaluation Patient Details Name: Elizabeth Olsen MRN: [id number] DOB: September 25, 1941 Today's Date: 05/17/2023  History of Present Illness  82 yo female presents to [facility] on 7/17 with SOB due to pleural effusion, RUE swelling. S/p  fistulogram RUE, R subclavian, innominate vein, superior vena cava balloon venoplasty on 7/17. 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  Clinical Impression   Pt presents with generalized weakness, impaired balance, impaired activity tolerance, min dyspnea on exertion with activity pt-reported though not observed during gait. Pt to benefit from acute PT to address deficits. Pt ambulated short hallway distance with use of RW and close guard for safety, pt at times requires light assist for bed mobility and transfers during eval. Pt maintained SPO2 100% on 4LO2 post-gait, placed on 2LO2 and notified RN. PT to progress mobility as tolerated, and will continue to follow acutely.          Assistance Recommended at Discharge Intermittent Supervision/Assistance  If plan is discharge home, recommend the following:  Can travel by private vehicle  A little help with walking and/or transfers;A little help with bathing/dressing/bathroom        Equipment Recommendations None recommended by PT  Recommendations for Other Services       Functional Status Assessment Patient has had a recent decline in their functional status and demonstrates the ability to make significant improvements in function in a reasonable and predictable amount of time.     Precautions / Restrictions Precautions Precautions: Fall Precaution Comments: supplemental O2 given dyspnea (placed down to 2LO2 from 4LO2 given SPO2 100%) Restrictions Weight Bearing Restrictions: No      Mobility  Bed  Mobility Overal bed mobility: Needs Assistance Bed Mobility: Supine to Sit, Sit to Supine     Supine to sit: Min assist Sit to supine: Min assist   General bed mobility comments: assist for LE progression into and out of bed    Transfers Overall transfer level: Needs assistance Equipment used: Rolling walker (2 wheels) Transfers: Sit to/from Stand Sit to Stand: Min assist           General transfer comment: light rise and steady assist, cues for correct hand placement    Ambulation/Gait Ambulation/Gait assistance: Min guard Gait Distance (Feet): 125 Feet Assistive device: Rolling walker (2 wheels) Gait Pattern/deviations: Step-through pattern, Decreased stride length, Trunk flexed Gait velocity: decr     General Gait Details: for safety, use of home RW. SPO2 100% upon return to room post-gait on 4LO2, dropped down to 2LO2 and RN aware  Stairs            Wheelchair Mobility     Tilt Bed    Modified Rankin (Stroke Patients Only)       Balance Overall balance assessment: Needs assistance Sitting-balance support: No upper extremity supported, Feet supported Sitting balance-Leahy Scale: Fair     Standing balance support: Bilateral upper extremity supported, During functional activity Standing balance-Leahy Scale: Fair                               Pertinent Vitals/Pain Pain Assessment Pain Assessment: Faces Faces Pain Scale: Hurts little more Pain Location: L hip (OA) Pain Descriptors / Indicators: Discomfort, Aching, Sore Pain Intervention(s): Limited activity within patient's tolerance, Monitored during session, Repositioned  Home Living Family/patient expects to be discharged to:: Private residence Living Arrangements: Children Available Help at Discharge: Family;Available 24 hours/day Type of Home: Apartment Home Access: Elevator       Home Layout: One level Home Equipment: Pharmacist, hospital (2 wheels);Grab bars -  toilet      Prior Function Prior Level of Function : Needs assist             Mobility Comments: uses RW ADLs Comments: as needed help from daughter with dressing and bathing, sponge bathes presently given PD access but soon to be removed     Hand Dominance   Dominant Hand: Right    Extremity/Trunk Assessment   Upper Extremity Assessment Upper Extremity Assessment: Defer to OT evaluation    Lower Extremity Assessment Lower Extremity Assessment: Generalized weakness    Cervical / Trunk Assessment Cervical / Trunk Assessment: Normal  Communication   Communication: No difficulties  Cognition Arousal/Alertness: Awake/alert Behavior During Therapy: WFL for tasks assessed/performed Overall Cognitive Status: Within Functional Limits for tasks assessed                                          General Comments      Exercises     Assessment/Plan    PT Assessment Patient needs continued PT services  PT Problem List Decreased strength;Decreased mobility;Decreased activity tolerance;Decreased knowledge of use of DME;Pain;Decreased balance;Decreased safety awareness;Cardiopulmonary status limiting activity       PT Treatment Interventions DME instruction;Therapeutic activities;Gait training;Patient/family education;Balance training;Therapeutic exercise;Functional mobility training    PT Goals (Current goals can be found in the Care Plan section)  Acute Rehab PT Goals Patient Stated Goal: home PT Goal Formulation: With patient Time For Goal Achievement: 05/31/23 Potential to Achieve Goals: Good    Frequency Min 1X/week     Co-evaluation               AM-PAC PT "6 Clicks" Mobility  Outcome Measure Help needed turning from your back to your side while in a flat bed without using bedrails?: A Little Help needed moving from lying on your back to sitting on the side of a flat bed without using bedrails?: A Little Help needed moving to and from  a bed to a chair (including a wheelchair)?: A Little Help needed standing up from a chair using your arms (e.g., wheelchair or bedside chair)?: A Little Help needed to walk in hospital room?: A Little Help needed climbing 3-5 steps with a railing? : A Little 6 Click Score: 18    End of Session Equipment Utilized During Treatment: Oxygen Activity Tolerance: Patient tolerated treatment well Patient left: with call bell/phone within reach;with family/visitor present;in bed Nurse Communication: Mobility status PT Visit Diagnosis: Other abnormalities of gait and mobility (R26.89)    Time: 0865-7846 PT Time Calculation (min) (ACUTE ONLY): 25 min   Charges:   PT Evaluation $PT Eval Low Complexity: 1 Low PT Treatments $Therapeutic Activity: 8-22 mins PT General Charges $$ ACUTE PT VISIT: 1 Visit         Marye Round, PT DPT Acute Rehabilitation Services Secure Chat Preferred  Office [phone]   Airel Magadan E Christain [place] 05/17/2023, 10:37 AM

## 2023-05-17 NOTE — Procedures (Signed)
Thoracentesis  Procedure Note  Elizabeth Olsen  [id number]  08-06-1941  Date:05/17/23  Time:12:16 PM   Provider Performing:Keiandra Sullenger C Katrinka Blazing   Procedure: Thoracentesis with imaging guidance (42706)  Indication(s) Pleural Effusion  Consent Risks of the procedure as well as the alternatives and risks of each were explained to the patient and/or caregiver.  Consent for the procedure was obtained and is signed in the bedside chart  Anesthesia Topical only with 1% lidocaine    Time Out Verified patient identification, verified procedure, site/side was marked, verified correct patient position, special equipment/implants available, medications/allergies/relevant history reviewed, required imaging and test results available.   Sterile Technique Maximal sterile technique including full sterile barrier drape, hand hygiene, sterile gown, sterile gloves, mask, hair covering, sterile ultrasound probe cover (if used).  Procedure Description Ultrasound was used to identify appropriate pleural anatomy for placement and overlying skin marked.  Area of drainage cleaned and draped in sterile fashion. Lidocaine was used to anesthetize the skin and subcutaneous tissue.  750 cc's of straw appearing fluid was drained from the right pleural space. Catheter then removed and bandaid applied to site.   Complications/Tolerance None; patient tolerated the procedure well. Chest X-ray is ordered to confirm no post-procedural complication.   EBL Minimal   Specimen(s) Pleural fluid

## 2023-05-17 NOTE — TOC Initial Note (Signed)
Transition of Care [facility]) - Initial/Assessment Note    Patient Details  Name: Elizabeth Olsen MRN: [id number] Date of Birth: July 20, 1941  [facility]) CM/SW Contact:    Durenda Guthrie, RN Phone Number: 05/17/2023, 1:15 PM  Clinical Narrative:                 Patient adm with Pleural effusion. S/p Thoracentesis. Case manager spoke with patient's daughter, Lanier Prude, who states everything concerning patient is arranged and scheduled through her. She states she has no preference for a Home Health agency, permission received for Cm to locate Home Health agency. Referral called to [facility] [place] with Iantha Fallen. Byrd Hesselbach is requesting a rollator for her mom, states it would help with her getting around.   Expected Discharge Plan: Home w Home Health Services Barriers to Discharge: Continued Medical Work up   Patient Goals and CMS Choice Patient states their goals for this hospitalization and ongoing recovery are:: daughter wants mom home   Choice offered to / list presented to : Adult Children      Expected Discharge Plan and Services In-house Referral: NA Discharge Planning Services: CM Consult Post Acute Care Choice: Durable Medical Equipment, Home Health Living arrangements for the past 2 months: Apartment                 DME Arranged: Walker rolling with seat         HH Arranged: PT HH Agency: [facility] Date [facility] Agency Contacted: 05/17/23 Time HH Agency Contacted: 1310 Representative spoke with at [facility] Agency: Mayra Reel  Prior Living Arrangements/Services Living arrangements for the past 2 months: Apartment Lives with:: Self Patient language and need for interpreter reviewed:: Yes Do you feel safe going back to the place where you live?: Yes      Need for Family Participation in Patient Care: Yes (Comment) Care giver support system in place?: Yes (comment)   Criminal Activity/Legal Involvement Pertinent to Current Situation/Hospitalization: No -  Comment as needed  Activities of Daily Living Home Assistive Devices/Equipment: Cane (specify quad or straight), Walker (specify type) ADL Screening (condition at time of admission) Patient's cognitive ability adequate to safely complete daily activities?: Yes Is the patient deaf or have difficulty hearing?: No Does the patient have difficulty seeing, even when wearing glasses/contacts?: No Does the patient have difficulty concentrating, remembering, or making decisions?: No Patient able to express need for assistance with ADLs?: Yes Does the patient have difficulty dressing or bathing?: Yes Independently performs ADLs?: No Communication: Needs assistance, Independent Dressing (OT): Independent Grooming: Independent Feeding: Independent Bathing: Needs assistance Toileting: Independent Is this a change from baseline?: Pre-admission baseline In/Out Bed: Independent Is this a change from baseline?: Pre-admission baseline Is this a change from baseline?: Pre-admission baseline Does the patient have difficulty walking or climbing stairs?: Yes Weakness of Legs: Both Weakness of Arms/Hands: None  Permission Sought/Granted         Permission granted to share info w AGENCY: [id number]        Emotional Assessment         Alcohol / Substance Use: Not Applicable Psych Involvement: No (comment)  Admission diagnosis:  Acute dyspnea [R06.00] Patient Active Problem List   Diagnosis Date Noted   Acute dyspnea 05/16/2023   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   Iron deficiency anemia, unspecified 05/08/2023   Atherosclerotic heart disease of native coronary artery without angina pectoris 05/08/2023   Allergy, unspecified, initial encounter 05/08/2023   Secondary hyperparathyroidism  of renal origin (HCC) 05/08/2023   Atrial fibrillation with RVR (HCC) 05/05/2023   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) 02/14/2021   End stage renal disease (HCC) 09/21/2020   Congestive heart  failure (CHF) (HCC) 04/12/2020   Vitamin D deficiency 10/06/2019   Essential hypertension 09/11/2018   Vertigo 06/24/2018   Anemia of chronic disease 12/10/2017   Acquired hypothyroidism 10/13/2017   Type 1 diabetes (HCC) 07/23/2017   Hyperlipidemia 07/23/2017   Iron deficiency 07/23/2017   Cardiomyopathy (HCC) 07/23/2017   PCP:  [facility]:   [id number] [place], [place] - [address] [place] [address] Phone: [phone] Fax: [phone]  [id number] - [place], [place] - [address] AT [facility] OF [address] [place] [address] Phone: [phone] Fax: [phone]     Social Determinants of Health (SDOH) Social History: SDOH Screenings   Food Insecurity: No Food Insecurity (05/16/2023)  Housing: Low Risk  (05/16/2023)  Transportation Needs: No Transportation Needs (05/16/2023)  Utilities: Not At Risk (05/16/2023)  Depression (PHQ2-9): Low Risk  (08/24/2019)  Tobacco Use: Low Risk  (05/17/2023)   SDOH Interventions:     Readmission Risk Interventions     No data to display

## 2023-05-17 NOTE — Evaluation (Signed)
Clinical/Bedside Swallow Evaluation Patient Details  Name: Elizabeth Olsen MRN: [id number] Date of Birth: 1940-12-10  Today's Date: 05/17/2023 Time: SLP Start Time (ACUTE ONLY): 1355 SLP Stop Time (ACUTE ONLY): 1420 SLP Time Calculation (min) (ACUTE ONLY): 25 min  Past Medical History:  Past Medical History:  Diagnosis Date   Acquired hypothyroidism 10/13/2017   Acute diastolic CHF (congestive heart failure) (HCC) 11/20/2019   Acute respiratory failure with hypoxia (HCC) 10/13/2017   Acute systolic heart failure (HCC) 10/13/2017   Anemia    Anemia associated with diabetes mellitus (HCC) 10/13/2017   Anemia of chronic disease 12/10/2017   Blood transfusion    Blood transfusion without reported diagnosis    with breast surgery   Breast cancer (HCC) 15 years ago   left    Cardiomyopathy (HCC) 07/23/2017   Ejection fraction 45-50% may; from September 2018   CHF (congestive heart failure) (HCC)    Chronic cholecystitis with calculus 01/22/2018   Colon polyp 03/2007   adenomatous   COVID-19 virus infection 11/20/2019   Diabetes mellitus    dx 1998.  was told prior to getting chemo that her bld sugar rose.  She thought it would go back   Dyspnea    d/t anemia   Essential hypertension 09/11/2018   GERD (gastroesophageal reflux disease)    Heart murmur    as child   Hernia, incisional    Hyperlipidemia    Hypertension    Hypothyroidism    Iron deficiency 07/23/2017   Near syncope 04/01/2018   Refractory anemia, unspecified (HCC) 12/10/2017   Type 1 diabetes (HCC) 07/23/2017   Vertigo 06/24/2018   Vitamin D deficiency    Past Surgical History:  Past Surgical History:  Procedure Laterality Date   A/V FISTULAGRAM N/A 05/16/2023   Procedure: A/V Fistulagram;  Surgeon: Victorino Sparrow, MD;  Location: [facility] INVASIVE CV LAB;  Service: Cardiovascular;  Laterality: N/A;   ABDOMINAL HYSTERECTOMY     APPENDECTOMY     BREAST SURGERY     CHOLECYSTECTOMY  01/22/2018   LAPROSCOPIC    CHOLECYSTECTOMY  N/A 01/22/2018   Procedure: LAPAROSCOPIC CHOLECYSTECTOMY WITH INTRAOPERATIVE CHOLANGIOGRAM;  Surgeon: Manus Rudd, MD;  Location: [facility] OR;  Service: General;  Laterality: N/A;   COLONOSCOPY  2013   due next 03-2017   EYE SURGERY     bil cataracts   IUD REMOVAL     with appendectomy   MASTECTOMY, MODIFIED RADICAL W/RECONSTRUCTION Left 15 years ago   10 nodes out   PERIPHERAL VASCULAR INTERVENTION  05/16/2023   Procedure: PERIPHERAL VASCULAR INTERVENTION;  Surgeon: Victorino Sparrow, MD;  Location: [facility] INVASIVE CV LAB;  Service: Cardiovascular;;   TUMOR EXCISION Left    x 2, neck, head   HPI:  Pt with ESRD on HD, and HFrEF presenting to the ED with shortness of breath for 2 days that is progressively worsening. States pt at baseline sleeps with one pillow but currently using multiple pillows and had to sleep on a recliner. She states she has been more weaker lately.      She presented to ED on 05/05/23 as was unable to get HD since moving to [place] form [place]. She was noted to be a-fib and started on eliquis;7/18 CXR indicated Unchanged right pleural effusion and adjacent airspace opacity.ST consulted for swallow evaluation.    Assessment / Plan / Recommendation  Clinical Impression  Pt seen for clinical swallowing evaluation with reported globus sensation by daughter and SOB. Pt had thoracentesis with improved respiratory  function prior to BSE.  Pt's vocal quality initially min hoarse, but improved with oral hydration.  Pt had recently been removed from oxygen via [place] d/t SOB prior to procedure.  OME unremarkable.  Pt consumed various consistencies of thin via cup/straw, puree and mixed solids (chicken noodle soup) without overt s/sx of aspiration during assessment.  Oral/pharyngeal clearance and mastication efforts adequate during oral intake. Discussed breathing/swallowing reciprocity with pt/daughter and effect on swallowing when SOB. Discussed consuming smaller volume of liquids/bites when  SOB to improve swallowing efficiency.  Recommend continue current diet with general swallowing precautions in place. No further ST needs identified.  Thank you for this consult. SLP Visit Diagnosis: Dysphagia, unspecified (R13.10)    Aspiration Risk  No limitations    Diet Recommendation   Thin;Age appropriate regular  Medication Administration: Whole meds with liquid    Other  Recommendations Oral Care Recommendations: Oral care BID;Patient independent with oral care    Recommendations for follow up therapy are one component of a multi-disciplinary discharge planning process, led by the attending physician.  Recommendations may be updated based on patient status, additional functional criteria and insurance authorization.  Follow up Recommendations No SLP follow up      Assistance Recommended at Discharge  PRN  Functional Status Assessment Patient has had a recent decline in their functional status and demonstrates the ability to make significant improvements in function in a reasonable and predictable amount of time.  Frequency and Duration  (evaluation only)          Prognosis Prognosis for improved oropharyngeal function: Good      Swallow Study   General Date of Onset: 05/16/23 HPI: Pt with ESRD on HD, and HFrEF presenting to the ED with shortness of breath for 2 days that is progressively worsening. States pt at baseline sleeps with one pillow but currently using multiple pillows and had to sleep on a recliner. She states she has been more weaker lately.      She presented to ED on 05/05/23 as was unable to get HD since moving to [place] form [place]. She was noted to be a-fib and started on eliquis;7/18 CXR indicated Unchanged right pleural effusion and adjacent airspace opacity.ST consulted for swallow evaluation. Type of Study: Bedside Swallow Evaluation Previous Swallow Assessment: n/a Diet Prior to this Study: Regular;Thin liquids (Level 0) Temperature Spikes Noted:  No Respiratory Status: Room air History of Recent Intubation: No Behavior/Cognition: Alert;Cooperative Oral Cavity Assessment: Within Functional Limits Oral Care Completed by SLP: Other (Comment) (recent completion by pt) Oral Cavity - Dentition: Adequate natural dentition Vision: Functional for self-feeding Self-Feeding Abilities: Able to feed self Patient Positioning: Upright in bed Baseline Vocal Quality: Other (comment) (min hoarse initially, but improved with oral hydration; (pt had recently been taken off of oxygen via [place])) Volitional Cough: Strong Volitional Swallow: Able to elicit    Oral/Motor/Sensory Function Overall Oral Motor/Sensory Function: Within functional limits   Ice Chips Ice chips: Not tested   Thin Liquid Thin Liquid: Within functional limits Presentation: Self Fed;Straw;Cup    Nectar Thick Nectar Thick Liquid: Not tested   Honey Thick Honey Thick Liquid: Not tested   Puree Puree: Within functional limits Presentation: Self Fed   Solid     Solid: Within functional limits Presentation: Self Fed      Pat Debby Clyne,M.S., CCC-SLP 05/17/2023,2:48 PM

## 2023-05-18 ENCOUNTER — Other Ambulatory Visit: Payer: Self-pay | Admitting: Student

## 2023-05-18 ENCOUNTER — Encounter (HOSPITAL_COMMUNITY): Payer: Self-pay | Admitting: Internal Medicine

## 2023-05-18 ENCOUNTER — Encounter: Payer: Self-pay | Admitting: Hematology

## 2023-05-18 ENCOUNTER — Other Ambulatory Visit (HOSPITAL_COMMUNITY): Payer: Self-pay

## 2023-05-18 LAB — HEMOGLOBIN A1C
Hgb A1c MFr Bld: 5.2 % (ref 4.8–5.6)
Mean Plasma Glucose: 103 mg/dL

## 2023-05-18 LAB — CBC
HCT: 25.5 % — ABNORMAL LOW (ref 36.0–46.0)
Hemoglobin: 8 g/dL — ABNORMAL LOW (ref 12.0–15.0)
MCH: 29.6 pg (ref 26.0–34.0)
MCHC: 31.4 g/dL (ref 30.0–36.0)
MCV: 94.4 fL (ref 80.0–100.0)
Platelets: 223 10*3/uL (ref 150–400)
RBC: 2.7 MIL/uL — ABNORMAL LOW (ref 3.87–5.11)
RDW: 13.5 % (ref 11.5–15.5)
WBC: 10.2 10*3/uL (ref 4.0–10.5)
nRBC: 0 % (ref 0.0–0.2)

## 2023-05-18 LAB — TRIGLYCERIDES, BODY FLUIDS: Triglycerides, Fluid: 33 mg/dL

## 2023-05-18 LAB — RENAL FUNCTION PANEL
Albumin: 2.9 g/dL — ABNORMAL LOW (ref 3.5–5.0)
Anion gap: 13 (ref 5–15)
BUN: 29 mg/dL — ABNORMAL HIGH (ref 8–23)
CO2: 24 mmol/L (ref 22–32)
Calcium: 7.7 mg/dL — ABNORMAL LOW (ref 8.9–10.3)
Chloride: 92 mmol/L — ABNORMAL LOW (ref 98–111)
Creatinine, Ser: 3.63 mg/dL — ABNORMAL HIGH (ref 0.44–1.00)
GFR, Estimated: 12 mL/min — ABNORMAL LOW (ref >60)
Glucose, Bld: 279 mg/dL — ABNORMAL HIGH (ref 70–99)
Phosphorus: 3.3 mg/dL (ref 2.5–4.6)
Potassium: 3.7 mmol/L (ref 3.5–5.1)
Sodium: 129 mmol/L — ABNORMAL LOW (ref 135–145)

## 2023-05-18 LAB — GLUCOSE, CAPILLARY
Glucose-Capillary: 240 mg/dL — ABNORMAL HIGH (ref 70–99)
Glucose-Capillary: 215 mg/dL — ABNORMAL HIGH (ref 70–99)

## 2023-05-18 LAB — CULTURE, BODY FLUID W GRAM STAIN -BOTTLE

## 2023-05-18 MED ORDER — METOPROLOL TARTRATE 25 MG PO TABS
12.5000 mg | ORAL_TABLET | Freq: Two times a day (BID) | ORAL | 0 refills | Status: DC | PRN
  Filled 2023-05-18: qty 30, 30d supply, fill #0

## 2023-05-18 MED ORDER — NYSTATIN 100000 UNIT/GM EX POWD
Freq: Two times a day (BID) | CUTANEOUS | 0 refills | Status: DC
  Filled 2023-05-18: qty 15, 30d supply, fill #0

## 2023-05-18 MED ORDER — CLONIDINE HCL 0.1 MG PO TABS
0.1000 mg | ORAL_TABLET | Freq: Every day | ORAL | 0 refills | Status: DC | PRN
  Filled 2023-05-18: qty 30, 30d supply, fill #0

## 2023-05-18 MED ORDER — METOPROLOL TARTRATE 12.5 MG HALF TABLET: 12.5000 mg | ORAL_TABLET | Freq: Two times a day (BID) | ORAL | Status: DC

## 2023-05-18 MED ORDER — METOPROLOL TARTRATE 25 MG PO TABS
12.5000 mg | ORAL_TABLET | ORAL | 0 refills | Status: DC | PRN
  Filled 2023-05-18: qty 30, fill #0

## 2023-05-18 MED ORDER — "ULTICARE INSULIN SYRINGE 30G X 1/2"" 0.5 ML MISC"
0 refills | Status: DC
  Filled 2023-05-18: qty 30, 30d supply, fill #0

## 2023-05-18 MED ORDER — INSULIN GLARGINE-YFGN 100 UNIT/ML ~~LOC~~ SOLN
14.0000 [IU] | Freq: Every day | SUBCUTANEOUS | Status: DC
  Administered 2023-05-18: 14 [IU] via SUBCUTANEOUS
  Filled 2023-05-18: qty 0.14

## 2023-05-18 NOTE — Progress Notes (Signed)
[facility]  Initial Hemodialysis Orders  Dialysis center: [facility]  Patient's name: Elizabeth Olsen DOB: [birth year] AKI or ESRD: ESRD  Discharge diagnosis: acute hypoxic respiratory failure  pleural effusion 2.  Allergies:  Allergies  Allergen Reactions  . Erythromycin Shortness Of Breath and Swelling    "throat swelling", sob, thrashing ,   . Penicillins Rash  . Nyquil [Pseudoeph-Doxylamine-Dm-Apap] Rash  . Sulfa Antibiotics Rash    Date of First Dialysis: 2022 in [place]  Cause of renal disease: diabetes mellitus  Dialysis Prescription: Dialysis Frequency: 3 times per week Tx duration: 4 hours BFR: 400 DFR: 800 .(Add titration orders here if needed) EDW: 57kg  Dialyzer: 180NRe UF profile/Sodium modeling?: no Dialysis Bath: 3 K 2 Ca  Dialysis access: Access type: AVF Date placed: 02/09/2021 Surgeon: Dr Alvie Heidelberg Needle gauge: 15g  In Center Medications: Heparin Dose: none  Type: N/A VDRA: Calcitriol/Hectorol none  q HD Venofer: none Mircera: 150 mcg q 2 weeks  Next dose due: 05/23/23 Sensipar: none  Discharge labs: Hgb: 8.0 K+: 3.7 Ca: 7.7 Phos:  Alb: 2.9  Please draw routine labs. Additional labs needed: weekly K+ while on 3K bath Additional notes/follow-up: PD cath to be removed outpatient

## 2023-05-18 NOTE — Progress Notes (Signed)
[facility] Progress Note   Subjective:   Pt seen prior to HD. Had thoracentesis yesterday with removed, reports her breathing is much better now. Feeling well this AM, denies SOB, CP, dizziness, abdominal pain and nausea.   Objective Vitals:   05/18/23 0623 05/18/23 0752 05/18/23 0819 05/18/23 0825  BP:  (!) 155/55  (!) 151/56  Pulse:  80  77  Resp:  18  17  Temp:  98.5 F (36.9 C)    TempSrc:  Oral    SpO2:  97%  98%  Weight: 62.2 kg  61 kg   Height:       Physical Exam General: WDWN female, alert and in NAD Heart: RRR, no murmurs, rubs or gallops Lungs: On RA, lungs CTA bilaterally Abdomen: Soft, non-distended, +BS Extremities: Trace edema b/l lower extremities Dialysis Access:  RUE AVF + bruit, PD cath in abdomen  Additional Objective Labs: Basic Metabolic Panel: Recent Labs  Lab 05/16/23 0610 05/17/23 0144 05/17/23 0145  NA 136  --  133*  K 3.8  --  3.9  CL 93*  --  93*  CO2 29  --  21*  GLUCOSE 140*  --  193*  BUN 12  --  5*  CREATININE 2.66*  --  1.61*  CALCIUM 8.0*  --  8.5*  PHOS  --  2.3*  --    Liver Function Tests: Recent Labs  Lab 05/16/23 0610 05/17/23 0145  AST 22 24  ALT 11 12  ALKPHOS 69 84  BILITOT 0.4 1.2  PROT 6.2* 6.9  ALBUMIN 3.2* 3.5   No results for input(s): "LIPASE", "AMYLASE" in the last 168 hours. CBC: Recent Labs  Lab 05/16/23 0610 05/17/23 0145 05/18/23 0831  WBC 6.8 10.7* 10.2  NEUTROABS 4.2  --   --   HGB 8.9* 10.5* 8.0*  HCT 29.2* 35.5* 25.5*  MCV 100.0 102.0* 94.4  PLT 236 114* 223   Blood Culture    Component Value Date/Time   SDES PLEURAL 05/17/2023 1216   SPECREQUEST NONE 05/17/2023 1216   REPTSTATUS 05/17/2023 FINAL 05/17/2023 1216    Cardiac Enzymes: No results for input(s): "CKTOTAL", "CKMB", "CKMBINDEX", "TROPONINI" in the last 168 hours. CBG: Recent Labs  Lab 05/17/23 0918 05/17/23 1240 05/17/23 1615 05/17/23 2107 05/18/23 0735  GLUCAP 265* 256* 243* 211* 240*    Iron Studies: No results for input(s): "IRON", "TIBC", "TRANSFERRIN", "FERRITIN" in the last 72 hours. @lablastinr3 @ Studies/Results: DG Chest Port 1 View  Result Date: 05/17/2023 CLINICAL DATA:  [id number] S/P thoracentesis [id number] EXAM: PORTABLE CHEST 1 VIEW COMPARISON:  Chest radiograph earlier the same day. FINDINGS: There is near complete resolution of previously seen right pleural effusion, status post thoracentesis. No pneumothorax seen. Probable patchy atelectatic changes noted in the bilateral lung bases. No dense consolidation or major lung collapse. Left lateral costophrenic angle is clear. Stable cardio-mediastinal silhouette. No acute osseous abnormalities. The soft tissues are within normal limits. Multiple surgical staples noted overlying the left breast and axillary region. IMPRESSION: *Near complete resolution of previously seen right pleural effusion, status post thoracentesis. No pneumothorax. Electronically Signed   By: Jules Schick M.D.   On: 05/17/2023 14:59   DG Chest Port 1 View  Result Date: 05/17/2023 CLINICAL DATA:  Shortness of breath, pleural effusion EXAM: PORTABLE CHEST 1 VIEW COMPARISON:  Chest radiograph 1 day prior FINDINGS: The cardiomediastinal silhouette is stable The right pleural effusion and adjacent airspace opacity are not significantly changed. There is no new or worsening focal  airspace opacity. There is no left pleural effusion. There is no pneumothorax There is no acute osseous abnormality. Left breast and axillary surgical clips are again noted. IMPRESSION: Unchanged right pleural effusion and adjacent airspace opacity. Electronically Signed   By: Lesia Hausen M.D.   On: 05/17/2023 08:39   PERIPHERAL VASCULAR CATHETERIZATION  Result Date: 05/16/2023 Images from the original result were not included. Patient name: Elizabeth Olsen MRN: [id number] DOB: 05-13-1941 Sex: female 05/16/2023 Pre-operative Diagnosis: Fistula malfunction Post-operative diagnosis:  Same  Surgeon:  Victorino Sparrow, MD Procedure Performed: 1.  Direct access of right arm fistula 2.  Fistulogram 3.  Right subclavian, right innominate vein, superior vena cava balloon venoplasty  8 x 80, 10 x 60 drug-coated balloon, 12 x 60 mm 4.  Access managed with Monocryl suture Indications: Patient is an 82 year old female with end-stage renal disease currently undergoing dialysis.  She is accessed through a right arm fistula, and over the last several days has appreciated right upper extremity swelling extending into the axilla.  There is concern for central stenosis.  After discussing the risk and benefits of right upper extremity fistulogram, [name] elected to proceed. Findings: Widely patent fistula anastomosis, widely patent fistula Patent axillary vein, subclavian vein, occlusion of the right innominate vein.  Procedure: Patient was brought to the Cath Lab and laid in supine position.  Monitored anesthesia was induced and patient was prepped and draped in standard fashion.  A timeout was called.  An ultrasound was used to evaluate the right arm fistula.  This was accessed using a micropuncture needle and a wire run into the fistula.  Sheath was placed.  Fistulogram followed.  See results above.  I elected to attempt intervention on the right innominate vein occlusion.  A series of wires and catheters were used to cross the occlusion, and the wire was positioned in the inferior vena cava.  Next, the patient was heparinized, and an 8 x 80 mm balloon was brought onto the field and expanded from the subclavian into the superior cava.  This demonstrated significant improvement, therefore I elected to move to a 10 x 60 mm drug-coated balloon.  This was the largest drug-coated balloon in our stock.  Follow-up venography demonstrated continued improvement, therefore I moved to a 12 x 80 mm balloon.  This was inflated for 2 minutes.  Follow-up venography demonstrated recanalization with less than 30% residual stenosis.   I felt this was an excellent result, therefore the case was terminated.  The access site was managed with a Monocryl suture. Impression: Successful recanalization of the right innominate vein with balloon angioplasty. Fara Olden, MD [facility] Office: [phone]   ECHOCARDIOGRAM COMPLETE  Result Date: 05/16/2023    ECHOCARDIOGRAM REPORT   Patient Name:   Elizabeth Olsen [facility] Date of Exam: 05/16/2023 Medical Rec #:  [id number]      Height:       62.0 in Accession #:    [id number]     Weight:       130.0 lb Date of Birth:  [birth year]       BSA:          1.592 m Patient Age:    82 years       BP:           191/68 mmHg Patient Gender: F              HR:  58 bpm. Exam Location:  Inpatient Procedure: 2D Echo, Cardiac Doppler and Color Doppler Indications:    CHF, ESRD  History:        Patient has prior history of Echocardiogram examinations.  Sonographer:    Wallie Char Referring Phys: [id number] CHRISTOPHER J TEGELER IMPRESSIONS  1. Left ventricular ejection fraction, by estimation, is 50 to 55%. The left ventricle has low normal function. The left ventricle has no regional wall motion abnormalities. There is mild concentric left ventricular hypertrophy. Left ventricular diastolic parameters are consistent with Grade III diastolic dysfunction (restrictive).  2. Right ventricular systolic function is normal. The right ventricular size is normal. There is severely elevated pulmonary artery systolic pressure. The estimated right ventricular systolic pressure is 69.2 mmHg.  3. Left atrial size was mildly dilated.  4. The mitral valve is abnormal. Mild to moderate mitral valve regurgitation. No evidence of mitral stenosis. Moderate mitral annular calcification.  5. The aortic valve was not well visualized. There is moderate calcification of the aortic valve. Aortic valve regurgitation is mild. Aortic valve sclerosis/calcification is present, without any evidence of aortic stenosis.  Aortic valve mean gradient measures 8.0 mmHg.  6. The inferior vena cava is normal in size with <50% respiratory variability, suggesting right atrial pressure of 8 mmHg. FINDINGS  Left Ventricle: Left ventricular ejection fraction, by estimation, is 50 to 55%. The left ventricle has low normal function. The left ventricle has no regional wall motion abnormalities. The left ventricular internal cavity size was normal in size. There is mild concentric left ventricular hypertrophy. Left ventricular diastolic parameters are consistent with Grade III diastolic dysfunction (restrictive). Right Ventricle: The right ventricular size is normal. No increase in right ventricular wall thickness. Right ventricular systolic function is normal. There is severely elevated pulmonary artery systolic pressure. The tricuspid regurgitant velocity is 3.91 m/s, and with an assumed right atrial pressure of 8 mmHg, the estimated right ventricular systolic pressure is 69.2 mmHg. Left Atrium: Left atrial size was mildly dilated. Right Atrium: Right atrial size was normal in size. Pericardium: Trivial pericardial effusion is present. Mitral Valve: The mitral valve is abnormal. There is mild calcification of the mitral valve leaflet(s). Moderate mitral annular calcification. Mild to moderate mitral valve regurgitation. No evidence of mitral valve stenosis. MV peak gradient, 8.9 mmHg. The mean mitral valve gradient is 3.0 mmHg. Tricuspid Valve: The tricuspid valve is normal in structure. Tricuspid valve regurgitation is mild. Aortic Valve: The aortic valve was not well visualized. There is moderate calcification of the aortic valve. Aortic valve regurgitation is mild. Aortic regurgitation PHT measures 501 msec. Aortic valve sclerosis/calcification is present, without any evidence of aortic stenosis. Aortic valve mean gradient measures 8.0 mmHg. Aortic valve peak gradient measures 14.3 mmHg. Aortic valve area, by VTI measures 1.80 cm. Pulmonic  Valve: The pulmonic valve was normal in structure. Pulmonic valve regurgitation is trivial. Aorta: The aortic root is normal in size and structure. Venous: The inferior vena cava is normal in size with less than 50% respiratory variability, suggesting right atrial pressure of 8 mmHg. IAS/Shunts: No atrial level shunt detected by color flow Doppler.  LEFT VENTRICLE PLAX 2D LVIDd:         4.90 cm      Diastology LVIDs:         3.60 cm      LV e' medial:    3.87 cm/s LV PW:         1.00 cm      LV E/e' medial:  37.5 LV IVS:        0.90 cm      LV e' lateral:   5.65 cm/s LVOT diam:     1.90 cm      LV E/e' lateral: 25.7 LV SV:         86 LV SV Index:   54 LVOT Area:     2.84 cm  LV Volumes (MOD) LV vol d, MOD A2C: 92.4 ml LV vol d, MOD A4C: 107.0 ml LV vol s, MOD A2C: 50.8 ml LV vol s, MOD A4C: 53.1 ml LV SV MOD A2C:     41.6 ml LV SV MOD A4C:     107.0 ml LV SV MOD BP:      50.5 ml RIGHT VENTRICLE             IVC RV Basal diam:  3.90 cm     IVC diam: 1.80 cm RV S prime:     12.70 cm/s TAPSE (M-mode): 2.5 cm LEFT ATRIUM             Index        RIGHT ATRIUM           Index LA diam:        4.30 cm 2.70 cm/m   RA Area:     14.80 cm LA Vol (A2C):   49.9 ml 31.35 ml/m  RA Volume:   39.40 ml  24.75 ml/m LA Vol (A4C):   53.5 ml 33.61 ml/m LA Biplane Vol: 52.1 ml 32.73 ml/m  AORTIC VALVE AV Area (Vmax):    1.73 cm AV Area (Vmean):   1.86 cm AV Area (VTI):     1.80 cm AV Vmax:           189.00 cm/s AV Vmean:          123.500 cm/s AV VTI:            0.479 m AV Peak Grad:      14.3 mmHg AV Mean Grad:      8.0 mmHg LVOT Vmax:         115.50 cm/s LVOT Vmean:        81.050 cm/s LVOT VTI:          0.303 m LVOT/AV VTI ratio: 0.63 AI PHT:            501 msec  AORTA Ao Root diam: 2.90 cm Ao Asc diam:  3.20 cm MITRAL VALVE                TRICUSPID VALVE MV Area (PHT): 5.23 cm     TR Peak grad:   61.2 mmHg MV Area VTI:   2.10 cm     TR Vmax:        391.00 cm/s MV Peak grad:  8.9 mmHg MV Mean grad:  3.0 mmHg     SHUNTS MV Vmax:        1.49 m/s     Systemic VTI:  0.30 m MV Vmean:      82.4 cm/s    Systemic Diam: 1.90 cm MV Decel Time: 145 msec MV E velocity: 145.00 cm/s MV A velocity: 78.00 cm/s MV E/A ratio:  1.86 Dalton McleanMD Electronically signed by Wilfred Lacy Signature Date/Time: 05/16/2023/3:31:12 PM    Final    Medications:   apixaban  2.5 mg Oral BID   atorvastatin  10 mg Oral Daily   Chlorhexidine Gluconate Cloth  6 each Topical Q0600   darbepoetin (ARANESP)  injection - DIALYSIS  100 mcg Subcutaneous Q Wed-1800   insulin aspart  0-5 Units Subcutaneous QHS   insulin aspart  0-6 Units Subcutaneous TID WC   insulin aspart  2 Units Subcutaneous TID WC   insulin glargine-yfgn  14 Units Subcutaneous Daily   ipratropium-albuterol  3 mL Nebulization Q6H   levothyroxine  88 mcg Oral Q0600   metoprolol tartrate  12.5 mg Oral BID   nystatin   Topical BID    Dialysis Orders: [facility]/TCU. 4 times a week (M, Tu, Th, F). 3 hours. 2K/2 calcium. EDW 68kg. F180. Flow rates: 400/800. RUE AVF. Meds: Mircera 100 mcg every 2 weeks (last dose of ESA 6/11 and was 30 mcg), no heparin reported   Assessment/Plan: 1. AHRF, pleural effusion - Underwent HD w/4.8L removed, CXR grossly unchanged. S/p thoracentesis yesterday with significant symptomatic improvement. HD today, continue regular schedule.  2. ESRD - Plan to run MWF while here.  To transition to in center HD which can be completed as outpatient.  Can return to [facility] for now. Still with PD cath- plan per vascular is to remove it outpatient.  3. Anemia of CKD- Hgb 8.0- significant decline with no bleeding reported. Rechecking Hgb tomorrow.  Will continue ESA.  4. Secondary hyperparathyroidism - CCa ok. VDRA. Phos 2.3, not receiving velphoro here, continue to hold.  5. HTN/volume - Blood pressure in goal.  Volume overloaded but imrpoving.  Will need lower dry weight on discharge.  6. Nutrition - Renal diet with fluid restrictions.   Rogers Blocker, PA-C 05/18/2023, 8:56 AM    [facility] Pager: [phone]

## 2023-05-18 NOTE — Inpatient Diabetes Management (Addendum)
Inpatient Diabetes Program Recommendations  AACE/ADA: New Consensus Statement on Inpatient Glycemic Control   Target Ranges:  Prepandial:   less than 140 mg/dL      Peak postprandial:   less than 180 mg/dL (1-2 hours)      Critically ill patients:  140 - 180 mg/dL    Latest Reference Range & Units 05/17/23 07:39 05/17/23 09:18 05/17/23 12:40 05/17/23 16:15 05/17/23 21:07 05/18/23 07:35  Glucose-Capillary 70 - 99 mg/dL 161 (H) 096 (H) 045 (H) 243 (H) 211 (H) 240 (H)   Review of Glycemic Control  Diabetes history: DM1 Outpatient Diabetes medications: OmniPod insulin pump (Basal 12A 0.45 units/hr, 7A 0.6 units/hr; Correction 1:50 mg/dl; Carb cov 1:15 grams) Current orders for Inpatient glycemic control: Semglee 14 units daily, Novolog 0-6 units TID with meals, Novolog 0-5 units at bedtime, Novolog 2 units TID with meals  Inpatient Diabetes Program Recommendations:    Insulin: Patient received Semglee 10 units on 05/17/23 and will receive 14 units today.  Please consider increasing meal coverage to Novolog 4 units TID with meals.  Addendum 05/18/23@12 :50-Spoke with patient at bedside. Discussed SQ insulin regimen and recommendation for changes made today.  Patient has no questions or concerns at this time.   Addendum 05/18/23@14 :13-Noted patient is being discharged home today. Spoke with patient's daughter Elizabeth Olsen over the phone. Explained that patient was given Semglee today at 13:56 so patient would need to use SQ insulin for correction and meal coverage today. Elizabeth Olsen reports that they have Humalog vials at home but no syringes for SQ injections. Informed Elizabeth Olsen that it would be requested that provider send Rx for insulin syringes; Elizabeth Olsen states that they usually use [facility] on [address] for prescriptions; informed Elizabeth Olsen that [facility] outpatient pharmacy is noted in chart. Will ask if patient can get Rx for insulin syringes through meds to bed via [facility] pharmacy. Elizabeth Olsen states that patient is almost  out of Clondine and would like to see if patient can get Rx for it as well. Informed Elizabeth Olsen I would send chat message to attending providers. Reminded Elizabeth Olsen that per patient's insulin pump, 1 unit drops glucose 50 mg/dl and 1 unit covers 15 grams of carbohydrates. Elizabeth Olsen states she will apply new Dexcom when patient gets home today but she will use SQ insulin today and resume patient's insulin pump tomorrow around noon.   Thanks, Orlando Penner, RN, MSN, CDCES Diabetes Coordinator Inpatient Diabetes Program [phone] (Team Pager from 8am to 5pm)

## 2023-05-18 NOTE — TOC Progression Note (Addendum)
Transition of Care [facility]) - Progression Note    Patient Details  Name: MINIE ROADCAP MRN: [id number] Date of Birth: 1941/06/10  [facility] CM/SW Contact  Lockie Pares, RN Phone Number: 05/18/2023, 2:03 PM  Clinical Narrative:     Patient still awaiting Clipping to dialysis center. Called Andree Elk and left a message to see where we are in the process. 1414 Andree Elk called back, he is unable to get into Epic to work on the patient due to the [employer] issue. He is calling his company to see what can be done nad has been rebooting every 30 minutes. RN made aware.  1420 TOC leadership Shanda Bumps Scinto aware of the issue with clipping 1500 Patient is found to be clipped to [facility], will DC today. Had dialysis. [facility] aware of patient DC. Expected Discharge Plan: Home w Home Health Services Barriers to Discharge: Continued Medical Work up  Expected Discharge Plan and Services In-house Referral: NA Discharge Planning Services: CM Consult Post Acute Care Choice: Durable Medical Equipment, Home Health Living arrangements for the past 2 months: Apartment Expected Discharge Date: 05/18/23               DME Arranged: Dan Humphreys rolling with seat DME Agency: [employer] Date DME Agency Contacted: 05/17/23 Time DME Agency Contacted: 1327 Representative spoke with at DME Agency: Marthann Schiller HH Arranged: PT HH Agency: [facility] Date [facility] Agency Contacted: 05/17/23 Time HH Agency Contacted: 1310 Representative spoke with at [facility] Agency: Amy Network engineer   Social Determinants of Health (SDOH) Interventions SDOH Screenings   Food Insecurity: No Food Insecurity (05/16/2023)  Housing: Low Risk  (05/16/2023)  Transportation Needs: No Transportation Needs (05/16/2023)  Utilities: Not At Risk (05/16/2023)  Depression (PHQ2-9): Low Risk  (08/24/2019)  Tobacco Use: Low Risk  (05/17/2023)    Readmission Risk Interventions     No data to display

## 2023-05-18 NOTE — Progress Notes (Signed)
Spoke with pt's daughter, Lanier Prude, by phone at [phone] about plan to discharge pt today. Informed pt's daughter that pt is looking and feeling improved after dialysis x2 and thoracentesis x1 while hospitalized. Informed her that pt with continue to have outpatient MWF dialysis at [facility] at 12:20pm, and to arrive by 12pm for these appointments. Pt was also scheduled for an [facility] outpatient appointment on 05/31/23 at 9:45am. Informed pt's daughter that vascular should be reaching out to her to schedule the PD cath removal outpatient.  Pt's daughter had questions regarding medication changes at discharge. Informed daughter that pt can take her lopressor at 12.5 mg as needed for HR > 100 or systolic BP > 160. Pt's statin will be discontinued at discharge given pt's history of myalgia on prior statin and daughter's report of pt's wishes to live comfortably rather than longer; they can discuss possible alternatives with outpatient provider, if wanted. Pt's daughter also informed pt will be getting her basal insulin dose today, and so she should not get additional basal dose via Dexcom once discharged; pt can take as needed insulin today and resume normal home dosing fully tomorrow.  Pt's daughter expressed understanding of upcoming appointments and medication changes, which will also be written in pt discharge instructions. Pt's daughter will be back to the hospital around 2:30-3pm and can bring pt home after this time.

## 2023-05-18 NOTE — Progress Notes (Signed)
   05/18/23 1209  Vitals  Temp 98.1 F (36.7 C)  Temp Source Oral  BP (!) 149/64  BP Location Left Arm  BP Method Automatic  Patient Position (if appropriate) Lying  Pulse Rate 80  Resp 18  Oxygen Therapy  SpO2 99 %  O2 Device Room Air  During Treatment Monitoring  Intra-Hemodialysis Comments Tx completed  Post Treatment  Dialyzer Clearance Lightly streaked  Duration of HD Treatment -hour(s) 3.5 hour(s)  Hemodialysis Intake (mL) 0 mL  Liters Processed 80  Fluid Removed (mL) 4200 mL  Tolerated HD Treatment Yes  Post-Hemodialysis Comments Pt goal met.  AVG/AVF Arterial Site Held (minutes) 10 minutes  AVG/AVF Venous Site Held (minutes) 10 minutes  Fistula / Graft Right Forearm Arteriovenous fistula  Placement Date: 02/09/21   Orientation: Right  Access Location: Forearm  Access Type: Arteriovenous fistula  Site Condition No complications  Fistula / Graft Assessment Present;Thrill;Bruit  Status Deaccessed  Needle Size 15  Drainage Description None

## 2023-05-18 NOTE — Progress Notes (Signed)
Fluid reviewed: transudate If anything comes in cytology I will contact patient Available PRN  Myrla Halsted MD PCCM

## 2023-05-18 NOTE — Discharge Summary (Signed)
Name: Elizabeth Olsen MRN: [id number] DOB: August 30, 1941 82 y.o. PCP: [facility]  Date of Admission: 05/16/2023  5:57 AM Date of Discharge: 05/18/2023 2:26 PM Attending Physician: Dr.  Ninetta Lights  Discharge Diagnosis: Principal Problem:   Acute dyspnea Active Problems:   Pleural effusion    Discharge Medications: Allergies as of 05/18/2023       Reactions   Erythromycin Shortness Of Breath, Swelling   "throat swelling", sob, thrashing ,    Penicillins Rash   Nyquil [pseudoeph-doxylamine-dm-apap] Rash   Sulfa Antibiotics Rash        Medication List     STOP taking these medications    atorvastatin 10 MG tablet Commonly known as: LIPITOR       TAKE these medications    apixaban 2.5 MG Tabs tablet Commonly known as: ELIQUIS Take 1 tablet (2.5 mg total) by mouth 2 (two) times daily.   cloNIDine 0.1 MG tablet Commonly known as: CATAPRES Take 0.1 mg by mouth daily as needed (FOR SYSTOLIC BLOOD PRESSURE GREATER THAN 170 AND OR DIASTOLIC FOR BLOOD PRESSURE GREATER 100).   FreeStyle [employer] 14 Day Sensor Misc See admin instructions.   HumaLOG 100 UNIT/ML injection Generic drug: insulin lispro USE as directed via insulin pump. max total daily DOSE of 50 UNITS   Lantus SoloStar 100 UNIT/ML Solostar Pen Generic drug: insulin glargine Inject into the skin in the morning. Sliding scale   levothyroxine 88 MCG tablet Commonly known as: SYNTHROID Take 1 tablet (88 mcg total) by mouth daily at 6 (six) AM.   lidocaine-prilocaine cream Commonly known as: EMLA Apply topically.   metoprolol tartrate 25 MG tablet Commonly known as: LOPRESSOR Take 1/2 tablet (12.5 mg total) by mouth 2 (two) times daily as needed (If heart rate above 100 OR Systolic blood pressure above 086). What changed:  how much to take when to take this reasons to take this additional instructions   nystatin powder Commonly known as: MYCOSTATIN/NYSTOP Apply topically 2 (two)  times daily.   Omnipod DASH Pods (Gen 4) Misc SMARTSIG:SUB-Q Every 3 Days   OneTouch Verio test strip Generic drug: glucose blood USE TO TEST UP TO 10 TIMES D               Durable Medical Equipment  (From admission, onward)           Start     Ordered   05/18/23 1326  DME Walker  Once       Question Answer Comment  Walker: With 5 Inch Wheels   Elizabeth needs a walker to treat with the following condition Hip joint pain      05/18/23 1329   05/17/23 1342  For home use only DME Walker rolling  Once       Question Answer Comment  Walker: With 5 Inch Wheels   Elizabeth needs a walker to treat with the following condition Hip joint pain      05/17/23 1341            Disposition and follow-up:   Elizabeth Olsen was discharged from [facility] in Good condition.  At the hospital follow up visit please address:  1.  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 -Please observe for worsening swelling of Right upper extremity  -Please ensure Elizabeth follows up with vascular surgery   *Atrial Fibrillation -ensure Elizabeth is adherent to eliquis -please recheck Elizabeth's blood pressure  -please note that the metoprolol tartrate is now 12.5mg  up to two times a day AS NEEDED if HR>100 or SBP> 160  *Coronary artery disease -Please note that we have discontinued the atorvastatin -Elizabeth does not want to take atorvastatin due to myalgias -please obtain lipid panel and explore other treatment options    *Grade III diastolic dysfunction *LVEF 50-55% *SEVERELY elevated pulmonary artery pressure  *Mitral valve regurgitation, mitral annular calcification *Left atrial dilation  *Aortic valve regurgitation, aortic valve calcification  *Mild tricuspid valve regurgitation     2.  Labs / imaging needed at time of follow-up: CBC, BMP, CXR  3.  Pending labs/ test needing follow-up: pleural fluid studies, pleural fluid cytology, pleural fluid cultures, pleural fluid gram stains    4.  Medication Changes  STOPPED  -Atorvastatin      ADDED  -Nystatin     MODIFIED  -Metoprolol tartrate 25mg  changed to 12.5mg  prn BID if HR>100 of SBP>160     Follow-up Appointments:  Follow-up Information     [facility], [facility]. Go on 05/21/2023.   Why: Schedule is Monday,Wednesday,Friday with 12:20 chair time.  Please arrive at 12:00. Contact information: [address] [place] [address] [phone]         [facility] Follow up.   Why: Your home health provider, they will call you to set up services 24-48 hours post discharge        [facility] Follow up.   Why: Your medical equipment company Contact information[address] [place] [address] [phone]                 Hospital Course by problem list: Acute dyspnea secondary to R pleural effusion ESRD HFrEF Pt presented with dyspnea not improved following 4.3L fluid removal during dialysis on Wed 7/17. CXR showed R pleural effusion progression along with new consolidative airspace disease at the right base. Thoracentesis done 7/18 with 750cc straw-colored fluid removed, likely transudate given LDH < 2/3 upper limit of normal serum LDH level, protein < 3.0 g/dL; triglycerides still pending. No indication for antibiotics. Dyspnea improved following. Echocardiogram showed LVEF of 50-55%, grade III diastolic dysfunction, with severely elevated pulmonary artery systolic  pressure. Pt received dialysis again Fri 7/19 with 4.5 L fluid removed. Once discharged, pt has been given a MWF 12:20 chair time at [facility] and will need to arrive at 12:00 for dialysis.   Right Innominate Vein occlusion Elizabeth presented with RUE edema thought to be due to a malfunction with her AV fistula. Vascular surgery performed a fistulogram on 7/17, they found a right innominate vein occlusion that was recanalized with balloon angioplasty. AV fistula was widely patent and did not require intervention.   Paroxsymal Atrial Fibrillation Elizabeth remained in NSR during hospitalization. Rate/rhythm controlled with Lopressor 12.5 mg BID, decreased from 25 mg BID during hospitalization given low diastolic BP readings. Stroke prophylaxis with Eliquis 2.5 mg BID.  T1DM At the hospital, pt was on Semglee 14 units daily, Novolog 2 units TID, SSI. She will resume her Dexcom and home regimen at  discharge.    Discharge Subjective:  Today, she is feeling better. Her breathing has also improved. Elizabeth denies chest pain, palpitations, dizziness/feeling light headed. She verbalized understanding of the plan for discharge today after dialysis.   Discharge Exam:   Blood pressure (!) 149/64, pulse 80, temperature 98.1 F (36.7 C), temperature source Oral, resp. rate 18, height 5\' 2"  (1.575 m), weight 56.8 kg, SpO2 99%.  Constitutional:non ill-appearing comfortably laying in bed at dialysis, in no acute distress Cardiovascular: regular rate and rhythm, no m/r/g Pulmonary/Chest: normal work of breathing on room air, lungs clear to auscultation bilaterally. No crackles, on room air Abdominal: soft, non-tender, non-distended.  Neurological: alert & oriented x 3 MSK: no gross abnormalities. No pitting edema Skin: warm and dry Psych: Normal mood and affect  Pertinent Labs, Studies, and Procedures:     Latest Ref Rng & Units 05/18/2023    8:31 AM 05/17/2023    1:45 AM 05/16/2023    6:10 AM  CBC  WBC 4.0 -  10.5 K/uL 10.2  10.7  6.8   Hemoglobin 12.0 - 15.0 g/dL 8.0  16.1  8.9   Hematocrit 36.0 - 46.0 % 25.5  35.5  29.2   Platelets 150 - 400 K/uL 223  114  236        Latest Ref Rng & Units 05/18/2023    8:31 AM 05/17/2023    1:45 AM 05/16/2023    6:10 AM  CMP  Glucose 70 - 99 mg/dL 096  045  409   BUN 8 - 23 mg/dL 29  5  12    Creatinine 0.44 - 1.00 mg/dL 8.11  9.14  7.82   Sodium 135 - 145 mmol/L 129  133  136   Potassium 3.5 - 5.1 mmol/L 3.7  3.9  3.8   Chloride 98 - 111 mmol/L 92  93  93   CO2 22 - 32 mmol/L 24  21  29    Calcium 8.9 - 10.3 mg/dL 7.7  8.5  8.0   Total Protein 6.5 - 8.1 g/dL  6.9  6.2   Total Bilirubin 0.3 - 1.2 mg/dL  1.2  0.4   Alkaline Phos 38 - 126 U/L  84  69   AST 15 - 41 U/L  24  22   ALT 0 - 44 U/L  12  11     DG Chest Port 1 View  Result Date: 05/17/2023 CLINICAL DATA:  [id number] S/P thoracentesis [id number] EXAM: PORTABLE CHEST 1 VIEW COMPARISON:  Chest radiograph earlier the same day. FINDINGS: There is near complete resolution of previously seen right pleural effusion, status post thoracentesis. No pneumothorax seen. Probable patchy atelectatic changes noted in the bilateral lung bases. No dense consolidation or major lung collapse. Left lateral costophrenic angle is clear. Stable cardio-mediastinal silhouette. No acute osseous abnormalities. The soft tissues are within normal limits. Multiple surgical staples noted overlying the left breast and axillary region. IMPRESSION: *Near complete resolution of previously seen right pleural effusion, status post thoracentesis. No pneumothorax. Electronically Signed   By: Jules Schick M.D.   On: 05/17/2023 14:59   DG Chest Port 1 View  Result Date: 05/17/2023 CLINICAL DATA:  Shortness of breath, pleural effusion EXAM: PORTABLE CHEST 1 VIEW COMPARISON:  Chest radiograph 1 day prior FINDINGS: The cardiomediastinal silhouette is stable The right pleural effusion and adjacent airspace opacity are not significantly changed. There  is no new or worsening focal airspace opacity. There is no left pleural effusion. There is no pneumothorax There  is no acute osseous abnormality. Left breast and axillary surgical clips are again noted. IMPRESSION: Unchanged right pleural effusion and adjacent airspace opacity. Electronically Signed   By: Lesia Hausen M.D.   On: 05/17/2023 08:39   PERIPHERAL VASCULAR CATHETERIZATION  Result Date: 05/16/2023 Images from the original result were not included. Elizabeth Olsen MRN: [id number] DOB: 06/27/1941 Sex: female 05/16/2023 Pre-operative Diagnosis: Fistula malfunction Post-operative diagnosis:  Same Surgeon:  Victorino Sparrow, MD Procedure Performed: 1.  Direct access of right arm fistula 2.  Fistulogram 3.  Right subclavian, right innominate vein, superior vena cava balloon venoplasty  8 x 80, 10 x 60 drug-coated balloon, 12 x 60 mm 4.  Access managed with Monocryl suture Indications: Elizabeth is an 82 year old female with end-stage renal disease currently undergoing dialysis.  She is accessed through a right arm fistula, and over the last several days has appreciated right upper extremity swelling extending into the axilla.  There is concern for central stenosis.  After discussing the risk and benefits of right upper extremity fistulogram, Elizabeth Olsen elected to proceed. Findings: Widely patent fistula anastomosis, widely patent fistula Patent axillary vein, subclavian vein, occlusion of the right innominate vein.  Procedure: Elizabeth was brought to the Cath Lab and laid in supine position.  Monitored anesthesia was induced and Elizabeth was prepped and draped in standard fashion.  A timeout was called.  An ultrasound was used to evaluate the right arm fistula.  This was accessed using a micropuncture needle and a wire run into the fistula.  Sheath was placed.  Fistulogram followed.  See results above.  I elected to attempt intervention on the right innominate vein occlusion.  A series of wires and catheters  were used to cross the occlusion, and the wire was positioned in the inferior vena cava.  Next, the Elizabeth was heparinized, and an 8 x 80 mm balloon was brought onto the field and expanded from the subclavian into the superior cava.  This demonstrated significant improvement, therefore I elected to move to a 10 x 60 mm drug-coated balloon.  This was the largest drug-coated balloon in our stock.  Follow-up venography demonstrated continued improvement, therefore I moved to a 12 x 80 mm balloon.  This was inflated for 2 minutes.  Follow-up venography demonstrated recanalization with less than 30% residual stenosis.  I felt this was an excellent result, therefore the case was terminated.  The access site was managed with a Monocryl suture. Impression: Successful recanalization of the right innominate vein with balloon angioplasty. Fara Olden, MD [facility] Office: [phone]   ECHOCARDIOGRAM COMPLETE  Result Date: 05/16/2023    ECHOCARDIOGRAM REPORT   Elizabeth Olsen [facility] Date of Exam: 05/16/2023 Medical Rec #:  [id number]      Height:       62.0 in Accession #:    [id number]     Weight:       130.0 lb Date of Birth:  Mar 29, 1941       BSA:          1.592 m Elizabeth Age:    82 years       BP:           191/68 mmHg Elizabeth Gender: F              HR:           58 bpm. Exam Location:  Inpatient Procedure: 2D Echo, Cardiac Doppler and Color Doppler  Indications:    CHF, ESRD  History:        Elizabeth has prior history of Echocardiogram examinations.  Sonographer:    Wallie Char Referring Phys: [id number] CHRISTOPHER J TEGELER IMPRESSIONS  1. Left ventricular ejection fraction, by estimation, is 50 to 55%. The left ventricle has low normal function. The left ventricle has no regional wall motion abnormalities. There is mild concentric left ventricular hypertrophy. Left ventricular diastolic parameters are consistent with Grade III diastolic dysfunction (restrictive).  2. Right  ventricular systolic function is normal. The right ventricular size is normal. There is severely elevated pulmonary artery systolic pressure. The estimated right ventricular systolic pressure is 69.2 mmHg.  3. Left atrial size was mildly dilated.  4. The mitral valve is abnormal. Mild to moderate mitral valve regurgitation. No evidence of mitral stenosis. Moderate mitral annular calcification.  5. The aortic valve was not well visualized. There is moderate calcification of the aortic valve. Aortic valve regurgitation is mild. Aortic valve sclerosis/calcification is present, without any evidence of aortic stenosis. Aortic valve mean gradient measures 8.0 mmHg.  6. The inferior vena cava is normal in size with <50% respiratory variability, suggesting right atrial pressure of 8 mmHg. FINDINGS  Left Ventricle: Left ventricular ejection fraction, by estimation, is 50 to 55%. The left ventricle has low normal function. The left ventricle has no regional wall motion abnormalities. The left ventricular internal cavity size was normal in size. There is mild concentric left ventricular hypertrophy. Left ventricular diastolic parameters are consistent with Grade III diastolic dysfunction (restrictive). Right Ventricle: The right ventricular size is normal. No increase in right ventricular wall thickness. Right ventricular systolic function is normal. There is severely elevated pulmonary artery systolic pressure. The tricuspid regurgitant velocity is 3.91 m/s, and with an assumed right atrial pressure of 8 mmHg, the estimated right ventricular systolic pressure is 69.2 mmHg. Left Atrium: Left atrial size was mildly dilated. Right Atrium: Right atrial size was normal in size. Pericardium: Trivial pericardial effusion is present. Mitral Valve: The mitral valve is abnormal. There is mild calcification of the mitral valve leaflet(s). Moderate mitral annular calcification. Mild to moderate mitral valve regurgitation. No evidence of  mitral valve stenosis. MV peak gradient, 8.9 mmHg. The mean mitral valve gradient is 3.0 mmHg. Tricuspid Valve: The tricuspid valve is normal in structure. Tricuspid valve regurgitation is mild. Aortic Valve: The aortic valve was not well visualized. There is moderate calcification of the aortic valve. Aortic valve regurgitation is mild. Aortic regurgitation PHT measures 501 msec. Aortic valve sclerosis/calcification is present, without any evidence of aortic stenosis. Aortic valve mean gradient measures 8.0 mmHg. Aortic valve peak gradient measures 14.3 mmHg. Aortic valve area, by VTI measures 1.80 cm. Pulmonic Valve: The pulmonic valve was normal in structure. Pulmonic valve regurgitation is trivial. Aorta: The aortic root is normal in size and structure. Venous: The inferior vena cava is normal in size with less than 50% respiratory variability, suggesting right atrial pressure of 8 mmHg. IAS/Shunts: No atrial level shunt detected by color flow Doppler.  LEFT VENTRICLE PLAX 2D LVIDd:         4.90 cm      Diastology LVIDs:         3.60 cm      LV e' medial:    3.87 cm/s LV PW:         1.00 cm      LV E/e' medial:  37.5 LV IVS:        0.90 cm  LV e' lateral:   5.65 cm/s LVOT diam:     1.90 cm      LV E/e' lateral: 25.7 LV SV:         86 LV SV Index:   54 LVOT Area:     2.84 cm  LV Volumes (MOD) LV vol d, MOD A2C: 92.4 ml LV vol d, MOD A4C: 107.0 ml LV vol s, MOD A2C: 50.8 ml LV vol s, MOD A4C: 53.1 ml LV SV MOD A2C:     41.6 ml LV SV MOD A4C:     107.0 ml LV SV MOD BP:      50.5 ml RIGHT VENTRICLE             IVC RV Basal diam:  3.90 cm     IVC diam: 1.80 cm RV S prime:     12.70 cm/s TAPSE (M-mode): 2.5 cm LEFT ATRIUM             Index        RIGHT ATRIUM           Index LA diam:        4.30 cm 2.70 cm/m   RA Area:     14.80 cm LA Vol (A2C):   49.9 ml 31.35 ml/m  RA Volume:   39.40 ml  24.75 ml/m LA Vol (A4C):   53.5 ml 33.61 ml/m LA Biplane Vol: 52.1 ml 32.73 ml/m  AORTIC VALVE AV Area (Vmax):    1.73  cm AV Area (Vmean):   1.86 cm AV Area (VTI):     1.80 cm AV Vmax:           189.00 cm/s AV Vmean:          123.500 cm/s AV VTI:            0.479 m AV Peak Grad:      14.3 mmHg AV Mean Grad:      8.0 mmHg LVOT Vmax:         115.50 cm/s LVOT Vmean:        81.050 cm/s LVOT VTI:          0.303 m LVOT/AV VTI ratio: 0.63 AI PHT:            501 msec  AORTA Ao Root diam: 2.90 cm Ao Asc diam:  3.20 cm MITRAL VALVE                TRICUSPID VALVE MV Area (PHT): 5.23 cm     TR Peak grad:   61.2 mmHg MV Area VTI:   2.10 cm     TR Vmax:        391.00 cm/s MV Peak grad:  8.9 mmHg MV Mean grad:  3.0 mmHg     SHUNTS MV Vmax:       1.49 m/s     Systemic VTI:  0.30 m MV Vmean:      82.4 cm/s    Systemic Diam: 1.90 cm MV Decel Time: 145 msec MV E velocity: 145.00 cm/s MV A velocity: 78.00 cm/s MV E/A ratio:  1.86 Dalton McleanMD Electronically signed by Wilfred Lacy Signature Date/Time: 05/16/2023/3:31:12 PM    Final    DG Chest 2 View  Result Date: 05/16/2023 CLINICAL DATA:  Shortness of breath. EXAM: CHEST - 2 VIEW COMPARISON:  05/05/2023 chest x-ray.  Chest CT 05/05/2023. FINDINGS: The cardio pericardial silhouette is enlarged. Left lung clear. New consolidative airspace disease at the right base with small  to moderate right pleural effusion. Bones are diffusely demineralized. IMPRESSION: Consolidative airspace disease at the right base with small to moderate right pleural effusion is progressive since prior CT of 05/05/2023. Electronically Signed   By: Kennith Center M.D.   On: 05/16/2023 07:00     Discharge Instructions: Discharge Instructions     (HEART FAILURE PATIENTS) Call MD:  Anytime you have any of the following symptoms: 1) 3 pound weight gain in 24 hours or 5 pounds in 1 week 2) shortness of breath, with or without a dry hacking cough 3) swelling in the hands, feet or stomach 4) if you have to sleep on extra pillows at night in order to breathe.   Complete by: As directed    Call MD for:  difficulty  breathing, headache or visual disturbances   Complete by: As directed    Call MD for:  extreme fatigue   Complete by: As directed    Call MD for:  hives   Complete by: As directed    Call MD for:  persistant dizziness or light-headedness   Complete by: As directed    Call MD for:  persistant nausea and vomiting   Complete by: As directed    Call MD for:  redness, tenderness, or signs of infection (pain, swelling, redness, odor or green/yellow discharge around incision site)   Complete by: As directed    Call MD for:  severe uncontrolled pain   Complete by: As directed    Call MD for:  temperature >100.4   Complete by: As directed    Diet - low sodium heart healthy   Complete by: As directed    Discharge instructions   Complete by: As directed    Elizabeth Olsen,  It was a pleasure caring for you during your hospitalization! You came to the hospital with shortness of breath which we believe was due to having too much fluid in your body. We treated you with dialysis, at which time we removed extra fluid fluid, and you also had a thoracentesis, where a needle was used to remove excess fluid from around your lung. Your breathing improved after these interventions!  There are a few things for you to do outside of the hospital: 1. Follow-up in the outpatient Internal Medicine clinic, located in the basement/ground floor of [facility]. [facility] made you an appointment for Thursday, 05/31/23, at 9:45am. 2. Continue to go to dialysis regularly on a Monday, Wednesday, Friday schedule. You will need to arrive to the [facility] at noon (12pm) for your dialysis sessions. 3. Follow up with Vascular outpatient to schedule surgical removal of PD cath. Their office should reach out to you, but if you do not hear from them within the next week or so, please reach out to them at [phone]. 4. Continue your Dexcom at home for insulin; do NOT take a basal dose today (Friday 7/19). You can  still use as-needed doses. Restart your basal insulin with the Dexcom tomorrow (Saturday 7/20). 5. Take your lopressor 12.5mg  up to two times a day as needed if HR is above 100 bpm or systolic blood pressure (the top number) is above 160. 6. Please note that we decreased your lopressor dose to 12.5mg !  Please call the Internal Medicine clinic at [phone] if you start to experience shortness of breath, notice increased swelling, or feel your heart racing or having extra beats.  Thank you for letting us care for you! Governor Rooks, Medical Student Dr. Irving Burton  Koren Sermersheim, PGY1 Dr. Morene Crocker, PGY2   Increase activity slowly   Complete by: As directed    No wound care   Complete by: As directed        Signed: Faith Rogue DO [facility] - PGY1 Pager: [phone] 05/18/2023, 2:26 PM    Please contact the on call pager after 5 pm and on weekends at [phone].

## 2023-05-18 NOTE — Progress Notes (Signed)
Pt in HD.  Spoke with Byrd Hesselbach, her daughter in room.  Let her know that the OR is shutdown due to outage and is backing cases up.  Discussed she will need to be done as an outpatient.  She expressed understanding.    Our office will call to make arrangements.  Byrd Hesselbach said it was best to contact her for scheduling.  Her number is listed as emergency contact.  Doreatha Massed, [facility] 05/18/2023 8:31 AM

## 2023-05-18 NOTE — Discharge Instructions (Signed)
Patient received Semglee 14 units at 13:56 on 05/18/23. Therefore, will need to use Humalog insulin via insulin injections with syringe for today and resume OmniPod insulin pump tomorrow (05/19/23) around lunch time.  Per insulin pump settings, insulin correction is 1:50 mg/dl (1 unit of insulin drops glucose 50 mg/dl) and carbohydrate coverage is 1:15 grams (1 unit for every 15 grams of carbohydrates).

## 2023-05-18 NOTE — Procedures (Signed)
Patient was seen on dialysis and the procedure was supervised.  BFR 400  Via AVF BP is  153/57.   Patient appears to be tolerating treatment well.  Elizabeth Olsen 05/18/2023

## 2023-05-19 ENCOUNTER — Encounter: Payer: Self-pay | Admitting: Student

## 2023-05-19 LAB — CULTURE, BODY FLUID W GRAM STAIN -BOTTLE

## 2023-05-21 LAB — CYTOLOGY - NON PAP

## 2023-05-21 LAB — CULTURE, BODY FLUID W GRAM STAIN -BOTTLE: Culture: NO GROWTH

## 2023-05-22 ENCOUNTER — Emergency Department (HOSPITAL_COMMUNITY): Payer: 59

## 2023-05-22 ENCOUNTER — Other Ambulatory Visit: Payer: Self-pay

## 2023-05-22 ENCOUNTER — Ambulatory Visit (HOSPITAL_COMMUNITY)
Admission: EM | Admit: 2023-05-22 | Discharge: 2023-05-22 | Disposition: A | Discharge status: Home / Self Care | Payer: 59 | Attending: Emergency Medicine | Admitting: Emergency Medicine

## 2023-05-22 ENCOUNTER — Emergency Department (HOSPITAL_COMMUNITY)
Admission: EM | Admit: 2023-05-22 | Discharge: 2023-05-22 | Disposition: A | Discharge status: Home / Self Care | Payer: 59 | Attending: Emergency Medicine | Admitting: Emergency Medicine

## 2023-05-22 ENCOUNTER — Encounter (HOSPITAL_COMMUNITY): Payer: Self-pay

## 2023-05-22 ENCOUNTER — Encounter (HOSPITAL_COMMUNITY): Payer: Self-pay | Admitting: *Deleted

## 2023-05-22 DIAGNOSIS — E039 Hypothyroidism, unspecified: Secondary | ICD-10-CM | POA: Diagnosis not present

## 2023-05-22 DIAGNOSIS — Z794 Long term (current) use of insulin: Secondary | ICD-10-CM | POA: Diagnosis not present

## 2023-05-22 DIAGNOSIS — Z79890 Hormone replacement therapy (postmenopausal): Secondary | ICD-10-CM | POA: Insufficient documentation

## 2023-05-22 DIAGNOSIS — E1122 Type 2 diabetes mellitus with diabetic chronic kidney disease: Secondary | ICD-10-CM | POA: Diagnosis not present

## 2023-05-22 DIAGNOSIS — R410 Disorientation, unspecified: Secondary | ICD-10-CM | POA: Diagnosis present

## 2023-05-22 DIAGNOSIS — I509 Heart failure, unspecified: Secondary | ICD-10-CM | POA: Diagnosis not present

## 2023-05-22 DIAGNOSIS — N186 End stage renal disease: Secondary | ICD-10-CM | POA: Diagnosis not present

## 2023-05-22 DIAGNOSIS — Z7901 Long term (current) use of anticoagulants: Secondary | ICD-10-CM | POA: Insufficient documentation

## 2023-05-22 DIAGNOSIS — R413 Other amnesia: Secondary | ICD-10-CM | POA: Insufficient documentation

## 2023-05-22 DIAGNOSIS — R4182 Altered mental status, unspecified: Secondary | ICD-10-CM | POA: Diagnosis not present

## 2023-05-22 LAB — BASIC METABOLIC PANEL
Anion gap: 11 (ref 5–15)
BUN: 30 mg/dL — ABNORMAL HIGH (ref 8–23)
CO2: 26 mmol/L (ref 22–32)
Calcium: 8.4 mg/dL — ABNORMAL LOW (ref 8.9–10.3)
Chloride: 96 mmol/L — ABNORMAL LOW (ref 98–111)
Creatinine, Ser: 2.81 mg/dL — ABNORMAL HIGH (ref 0.44–1.00)
GFR, Estimated: 16 mL/min — ABNORMAL LOW (ref >60)
Glucose, Bld: 150 mg/dL — ABNORMAL HIGH (ref 70–99)
Potassium: 4.2 mmol/L (ref 3.5–5.1)
Sodium: 133 mmol/L — ABNORMAL LOW (ref 135–145)

## 2023-05-22 LAB — POCT URINALYSIS DIP (MANUAL ENTRY)
Glucose, UA: NEGATIVE mg/dL
Leukocytes, UA: NEGATIVE
Nitrite, UA: NEGATIVE
Protein Ur, POC: >=300 mg/dL — AB
Spec Grav, UA: 1.025 (ref 1.010–1.025)
Urobilinogen, UA: 0.2 E.U./dL
pH, UA: 5 (ref 5.0–8.0)

## 2023-05-22 LAB — CBC
HCT: 31 % — ABNORMAL LOW (ref 36.0–46.0)
Hemoglobin: 9.3 g/dL — ABNORMAL LOW (ref 12.0–15.0)
MCH: 30.1 pg (ref 26.0–34.0)
MCHC: 30 g/dL (ref 30.0–36.0)
MCV: 100.3 fL — ABNORMAL HIGH (ref 80.0–100.0)
Platelets: 273 10*3/uL (ref 150–400)
RBC: 3.09 MIL/uL — ABNORMAL LOW (ref 3.87–5.11)
RDW: 13.4 % (ref 11.5–15.5)
WBC: 8.4 10*3/uL (ref 4.0–10.5)
nRBC: 0 % (ref 0.0–0.2)

## 2023-05-22 LAB — HEPATIC FUNCTION PANEL
ALT: 13 U/L (ref 0–44)
AST: 20 U/L (ref 15–41)
Albumin: 3 g/dL — ABNORMAL LOW (ref 3.5–5.0)
Alkaline Phosphatase: 64 U/L (ref 38–126)
Bilirubin, Direct: 0.1 mg/dL (ref 0.0–0.2)
Indirect Bilirubin: 0.5 mg/dL (ref 0.3–0.9)
Total Bilirubin: 0.6 mg/dL (ref 0.3–1.2)
Total Protein: 6 g/dL — ABNORMAL LOW (ref 6.5–8.1)

## 2023-05-22 LAB — CBG MONITORING, ED: Glucose-Capillary: 119 mg/dL — ABNORMAL HIGH (ref 70–99)

## 2023-05-22 LAB — TSH: TSH: 0.689 u[IU]/mL (ref 0.350–4.500)

## 2023-05-22 LAB — AMMONIA: Ammonia: 18 umol/L (ref 9–35)

## 2023-05-22 LAB — CULTURE, BODY FLUID W GRAM STAIN -BOTTLE

## 2023-05-22 NOTE — ED Triage Notes (Signed)
Pt's daughter states pt's glucose has been over 200 consistently even when pt not eating since D/C from hosp. Per pt's daughter, pt has been confused, lethargic, weak, unsteady on feet since D/C from hosp. Per daughter, pt was confused when she was here in hosp

## 2023-05-22 NOTE — ED Provider Notes (Signed)
[facility]    CSN: [id number] Arrival date & time: 05/22/23  1134     History   Chief Complaint Chief Complaint  Patient presents with   Altered Mental Status    HPI Elizabeth Olsen is a 82 y.o. female.  Presents with daughter who provides the history Patient has been disoriented, confused and lethargic for the last 3 days. Patient reports she feels fine but her daughter is concerned. Daughter would like her urine to be tested  Patient with history of CHF, DM, anemia, breast cancer, afib on Eliquis, multiple myeloma, ESRD  She was admitted to hospital last week for shortness of breath, had thoracentesis  Blood sugars have been high even without eating, per daughter. Fasting this morning is 200 per dexcom  Past Medical History:  Diagnosis Date   Acquired hypothyroidism 10/13/2017   Acute diastolic CHF (congestive heart failure) (HCC) 11/20/2019   Acute respiratory failure with hypoxia (HCC) 10/13/2017   Acute systolic heart failure (HCC) 10/13/2017   Anemia    Anemia associated with diabetes mellitus (HCC) 10/13/2017   Anemia of chronic disease 12/10/2017   Blood transfusion    Blood transfusion without reported diagnosis    with breast surgery   Breast cancer (HCC) 15 years ago   left    Cardiomyopathy (HCC) 07/23/2017   Ejection fraction 45-50% may; from September 2018   CHF (congestive heart failure) (HCC)    Chronic cholecystitis with calculus 01/22/2018   Colon polyp 03/2007   adenomatous   COVID-19 virus infection 11/20/2019   Diabetes mellitus    dx 1998.  was told prior to getting chemo that her bld sugar rose.  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   Iron deficiency anemia, unspecified 05/08/2023   Atherosclerotic heart disease of native coronary artery without angina pectoris 05/08/2023   Allergy, unspecified, initial encounter 05/08/2023   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   Atrial fibrillation with RVR (HCC) 05/05/2023   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) 02/14/2021   End stage renal disease (HCC) 09/21/2020   Congestive heart failure (CHF) (HCC) 04/12/2020   Vitamin D deficiency 10/06/2019   Essential hypertension 09/11/2018   Vertigo 06/24/2018   Anemia of chronic disease 12/10/2017   Acquired hypothyroidism 10/13/2017   Type 1 diabetes (HCC) 07/23/2017   Hyperlipidemia 07/23/2017   Iron deficiency 07/23/2017   Cardiomyopathy (HCC) 07/23/2017    Past Surgical History:  Procedure Laterality Date   A/V FISTULAGRAM N/A 05/16/2023   Procedure: A/V Fistulagram;  Surgeon: Victorino Sparrow, MD;  Location: [facility] INVASIVE CV LAB;  Service: Cardiovascular;  Laterality: N/A;   ABDOMINAL HYSTERECTOMY     APPENDECTOMY     BREAST SURGERY     CHOLECYSTECTOMY  01/22/2018   LAPROSCOPIC    CHOLECYSTECTOMY N/A 01/22/2018   Procedure: LAPAROSCOPIC CHOLECYSTECTOMY WITH INTRAOPERATIVE CHOLANGIOGRAM;  Surgeon:  Manus Rudd, MD;  Location: [facility] OR;  Service: General;  Laterality: N/A;   COLONOSCOPY  2013   due next 03-2017   EYE SURGERY     bil cataracts   IUD REMOVAL     with appendectomy   MASTECTOMY, MODIFIED RADICAL W/RECONSTRUCTION Left 15 years ago   10 nodes out   PERIPHERAL VASCULAR INTERVENTION  05/16/2023   Procedure: PERIPHERAL VASCULAR INTERVENTION;  Surgeon: Victorino Sparrow, MD;  Location: [facility] INVASIVE CV LAB;  Service: Cardiovascular;;   THORACENTESIS Right  05/17/2023   Procedure: THORACENTESIS;  Surgeon: Lorin Glass, MD;  Location: [facility] ENDOSCOPY;  Service: Pulmonary;  Laterality: Right;   TUMOR EXCISION Left    x 2, neck, head    OB History   No obstetric history on file.      Home Medications    Prior to Admission medications   Medication Sig Start Date End Date Taking? Authorizing Provider  apixaban (ELIQUIS) 2.5 MG TABS tablet Take 1 tablet (2.5 mg total) by mouth 2 (two) times daily. 05/06/23  Yes Bender, Irving Burton, DO  cloNIDine (CATAPRES) 0.1 MG tablet Take 1 tablet (0.1 mg total) by mouth daily as needed (FOR SYSTOLIC BLOOD PRESSURE GREATER THAN 170 AND OR DIASTOLIC FOR BLOOD PRESSURE GREATER 100). 05/18/23  Yes Bender, Irving Burton, DO  Continuous Blood Gluc Sensor (FREESTYLE LIBRE 14 DAY SENSOR) MISC See admin instructions. 10/22/19  Yes [provider]  HUMALOG 100 UNIT/ML injection USE as directed via insulin pump. max total daily DOSE of 50 UNITS 02/12/20  Yes [provider]  Insulin Disposable Pump (OMNIPOD DASH 5 PACK PODS) MISC SMARTSIG:SUB-Q Every 3 Days 05/11/20  Yes [provider]  Insulin Syringe-Needle U-100 (ULTICARE INSULIN SYRINGE) 30G X 1/2" 0.5 ML MISC Please use as previously directed 05/18/23  Yes Bender, Irving Burton, DO  levothyroxine (SYNTHROID) 88 MCG tablet Take 1 tablet (88 mcg total) by mouth daily at 6 (six) AM. 05/07/23 06/06/23 Yes Morene Crocker, MD  lidocaine-prilocaine (EMLA) cream Apply topically. 04/07/23  Yes [provider]  metoprolol tartrate (LOPRESSOR) 25 MG tablet Take 1/2 tablet (12.5 mg total) by mouth 2 (two) times daily as needed (If heart rate above 100 OR Systolic blood pressure above 960). 05/18/23  Yes Bender, Emily, DO  nystatin (MYCOSTATIN/NYSTOP) powder Apply topically 2 (two) times daily. 05/18/23  Yes Bender, Irving Burton, DO  ONETOUCH VERIO test strip USE TO TEST UP TO 10 TIMES D 08/24/17  Yes [provider]  insulin glargine (LANTUS SOLOSTAR) 100 UNIT/ML  Solostar Pen Inject into the skin in the morning. Sliding scale 01/26/20   [provider]    Family History Family History  Problem Relation Age of Onset   Diabetes Other        both sides of family   Hypertension Father    Congestive Heart Failure Father    Peripheral vascular disease Father    Hypertension Maternal Grandfather    Hypertension Maternal Grandmother    Stomach cancer Maternal Grandmother        GGM   Heart attack Brother    Colon cancer Neg Hx    Esophageal cancer Neg Hx    Pancreatic cancer Neg Hx    Rectal cancer Neg Hx     Social History Social History   Tobacco Use   Smoking status: Never   Smokeless tobacco: Never  Vaping Use   Vaping status: Never Used  Substance Use Topics   Alcohol use: No   Drug use: No  Allergies   Erythromycin, Penicillins, Nyquil [pseudoeph-doxylamine-dm-apap], and Sulfa antibiotics   Review of Systems Review of Systems As per HPI  Physical Exam Triage Vital Signs ED Triage Vitals [05/22/23 1220]  Encounter Vitals Group     BP      Systolic BP Percentile      Diastolic BP Percentile      Pulse      Resp      Temp      Temp src      SpO2      Weight      Height      Head Circumference      Peak Flow      Pain Score 0     Pain Loc      Pain Education      Exclude from Growth Chart    No data found.  Updated Vital Signs BP (!) 185/69 (BP Location: Left Arm)   Pulse 79   Temp 97.8 F (36.6 C) (Oral)   Resp 18   SpO2 96%    Physical Exam Vitals and nursing note reviewed.  Constitutional:      General: She is not in acute distress.    Appearance: She is not ill-appearing.  HENT:     Mouth/Throat:     Pharynx: Oropharynx is clear.  Cardiovascular:     Rate and Rhythm: Normal rate and regular rhythm.     Pulses: Normal pulses.  Pulmonary:     Effort: Pulmonary effort is normal.  Musculoskeletal:     Cervical back: Normal range of motion.  Skin:    General: Skin is warm and dry.   Neurological:     Mental Status: She is alert and oriented to person, place, and time.     Comments: Currently oriented, answers questions appropriately      UC Treatments / Results  Labs (all labs ordered are listed, but only abnormal results are displayed) Labs Reviewed  POCT URINALYSIS DIP (MANUAL ENTRY) - Abnormal; Notable for the following components:      Result Value   Bilirubin, UA small (*)    Ketones, POC UA trace (5) (*)    Blood, UA small (*)    Protein Ur, POC >=300 (*)    All other components within normal limits    EKG  Radiology No results found.  Procedures Procedures  Medications Ordered in UC Medications - No data to display  Initial Impression / Assessment and Plan / UC Course  I have reviewed the triage vital signs and the nursing notes.  Pertinent labs & imaging results that were available during my care of the patient were reviewed by me and considered in my medical decision making (see chart for details).  UA trace ketones. Protein and small blood With patient past medical history, recent hospitalization, and daughters concerns for change in mental status, I have advised she be evaluated in the emergency department. Requires higher level of care, advanced imaging and blood work. Daughter will transport her to the ED across the parking lot via POV  Final Clinical Impressions(s) / UC Diagnoses   Final diagnoses:  Altered mental status, unspecified altered mental status type     Discharge Instructions      Sent to ED via POV with daughter      ED Prescriptions   None    PDMP not reviewed this encounter.   Amalie Koran, Lurena Joiner, [place] 05/22/23 1253

## 2023-05-22 NOTE — Discharge Instructions (Addendum)
Sent to ED via POV with daughter

## 2023-05-22 NOTE — ED Provider Notes (Signed)
[place] [facility] AT [facility] Provider Note   CSN: [id number] Arrival date & time: 05/22/23  1248     History  Chief Complaint  Patient presents with   Hyperglycemia    Elizabeth Olsen is a 82 y.o. female.   Hyperglycemia Associated symptoms: confusion   Patient presents for confusion and memory loss.  Medical history includes DM, HLD, anemia, ESRD, CHF, multiple myeloma, GERD, hypothyroidism.  She was recently admitted to the hospital for hypoxic respiratory failure.  She underwent dialysis and thoracentesis.  Pleural fluid analysis considered likely transudative.  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  She was discharged home 4 days ago.  Since her return home, daughter has noticed increased confusion.  Patient forgot an acquaintance that she knows well.  Daughter is concerned about blood sugars that have been slightly elevated in the range of 200.  Patient has a Dexcom monitor and insulin pump.  She was previously managing this on her own but has had increased difficulty with it lately.  Patient has had a return of appetite since she has been home.  She has been undergoing dialysis, most recently of which was yesterday.  Patient, herself, has no complaints.     Home Medications Prior to Admission medications   Medication Sig Start Date End Date Taking? Authorizing Provider  apixaban (ELIQUIS) 2.5 MG TABS tablet Take 1 tablet (2.5 mg total) by mouth 2 (two) times daily. 05/06/23   Faith Rogue, DO  cloNIDine (CATAPRES) 0.1 MG tablet Take 1 tablet (0.1 mg total) by mouth daily as needed (FOR SYSTOLIC BLOOD PRESSURE GREATER THAN 170 AND OR DIASTOLIC FOR BLOOD PRESSURE GREATER 100). 05/18/23   Faith Rogue, DO  Continuous Blood Gluc Sensor (FREESTYLE LIBRE 14 DAY SENSOR) MISC See admin instructions. 10/22/19   [provider]  HUMALOG 100 UNIT/ML injection USE as directed via insulin pump. max  total daily DOSE of 50 UNITS 02/12/20   [provider]  Insulin Disposable Pump (OMNIPOD DASH 5 PACK PODS) MISC SMARTSIG:SUB-Q Every 3 Days 05/11/20   [provider]  insulin glargine (LANTUS SOLOSTAR) 100 UNIT/ML Solostar Pen Inject into the skin in the morning. Sliding scale 01/26/20   [provider]  Insulin Syringe-Needle U-100 (ULTICARE INSULIN SYRINGE) 30G X 1/2" 0.5 ML MISC Please use as previously directed 05/18/23   Faith Rogue, DO  levothyroxine (SYNTHROID) 88 MCG tablet Take 1 tablet (88 mcg total) by mouth daily at 6 (six) AM. 05/07/23 06/06/23  Morene Crocker, MD  lidocaine-prilocaine (EMLA) cream Apply topically. 04/07/23   [provider]  metoprolol tartrate (LOPRESSOR) 25 MG tablet Take 1/2 tablet (12.5 mg total) by mouth 2 (two) times daily as needed (If heart rate above 100 OR Systolic blood pressure above 213). 05/18/23   Bender, Irving Burton, DO  nystatin (MYCOSTATIN/NYSTOP) powder Apply topically 2 (two) times daily. 05/18/23   Faith Rogue, DO  ONETOUCH VERIO test strip USE TO TEST UP TO 10 TIMES D 08/24/17   [provider]      Allergies    Erythromycin, Penicillins, Nyquil [pseudoeph-doxylamine-dm-apap], and Sulfa antibiotics    Review of Systems   Review of Systems  Constitutional:  Positive for activity change.  Psychiatric/Behavioral:  Positive for confusion.   All other systems reviewed and are negative.   Physical Exam Updated Vital Signs BP (!) 180/62   Pulse 80   Temp (!) 97.5 F (36.4 C) (Oral)   Resp 18  Ht 5\' 2"  (1.575 m)   Wt 56.8 kg   SpO2 100%   BMI 22.90 kg/m  Physical Exam Vitals and nursing note reviewed.  Constitutional:      General: She is not in acute distress.    Appearance: Normal appearance. She is well-developed. She is not ill-appearing, toxic-appearing or diaphoretic.  HENT:     Head: Normocephalic and atraumatic.     Right Ear: External ear normal.     Left Ear: External ear normal.      Nose: Nose normal.     Mouth/Throat:     Mouth: Mucous membranes are moist.  Eyes:     Extraocular Movements: Extraocular movements intact.  Cardiovascular:     Rate and Rhythm: Normal rate and regular rhythm.     Heart sounds: No murmur heard. Pulmonary:     Effort: Pulmonary effort is normal. No respiratory distress.     Breath sounds: Normal breath sounds. No wheezing or rales.  Chest:     Chest wall: No tenderness.  Abdominal:     General: There is no distension.     Palpations: Abdomen is soft.     Tenderness: There is no abdominal tenderness.  Musculoskeletal:        General: No swelling. Normal range of motion.     Cervical back: Normal range of motion and neck supple.  Skin:    General: Skin is warm and dry.     Coloration: Skin is not jaundiced or pale.  Neurological:     General: No focal deficit present.     Mental Status: She is alert and oriented to person, place, and time.     Cranial Nerves: No cranial nerve deficit.     Sensory: No sensory deficit.     Motor: No weakness.     Coordination: Coordination normal.  Psychiatric:        Mood and Affect: Mood normal.        Behavior: Behavior normal.        Thought Content: Thought content normal.        Judgment: Judgment normal.     ED Results / Procedures / Treatments   Labs (all labs ordered are listed, but only abnormal results are displayed) Labs Reviewed  BASIC METABOLIC PANEL - Abnormal; Notable for the following components:      Result Value   Sodium 133 (*)    Chloride 96 (*)    Glucose, Bld 150 (*)    BUN 30 (*)    Creatinine, Ser 2.81 (*)    Calcium 8.4 (*)    GFR, Estimated 16 (*)    All other components within normal limits  CBC - Abnormal; Notable for the following components:   RBC 3.09 (*)    Hemoglobin 9.3 (*)    HCT 31.0 (*)    MCV 100.3 (*)    All other components within normal limits  HEPATIC FUNCTION PANEL - Abnormal; Notable for the following components:   Total Protein  6.0 (*)    Albumin 3.0 (*)    All other components within normal limits  CBG MONITORING, ED - Abnormal; Notable for the following components:   Glucose-Capillary 119 (*)    All other components within normal limits  AMMONIA  URINALYSIS, W/ REFLEX TO CULTURE (INFECTION SUSPECTED)  TSH    EKG EKG Interpretation Date/Time:  Tuesday May 22 2023 12:56:44 EDT Ventricular Rate:  75 PR Interval:  168 QRS Duration:  96 QT Interval:  412  QTC Calculation: 460 R Axis:   94  Text Interpretation: Normal sinus rhythm Rightward axis Cannot rule out Anterior infarct , age undetermined Confirmed by Gloris Manchester [phone]) on 05/22/2023 1:58:00 PM  Radiology DG Chest Portable 1 View  Result Date: 05/22/2023 CLINICAL DATA:  Confusion, altered mental status, hyperglycemia EXAM: PORTABLE CHEST 1 VIEW COMPARISON:  Previous studies including the examination of 05/17/2023 FINDINGS: Transverse diameter of heart is increased. Central pulmonary vessels are prominent. There is interval appearance of small right pleural effusion. Left lateral CP angle is clear. Increased markings are seen in right lower lung field. There is no pneumothorax. Surgical clips are seen in left chest wall. IMPRESSION: Cardiomegaly. Central pulmonary vessels are more prominent suggesting CHF. Increased markings are seen in right lower lung fields suggesting asymmetric pulmonary edema or interstitial pneumonia. Small right pleural effusion. Electronically Signed   By: Ernie Avena M.D.   On: 05/22/2023 16:34   CT Head Wo Contrast  Result Date: 05/22/2023 CLINICAL DATA:  Mental status change EXAM: CT HEAD WITHOUT CONTRAST TECHNIQUE: Contiguous axial images were obtained from the base of the skull through the vertex without intravenous contrast. RADIATION DOSE REDUCTION: This exam was performed according to the departmental dose-optimization program which includes automated exposure control, adjustment of the mA and/or kV according to  patient size and/or use of iterative reconstruction technique. COMPARISON:  MRI brain 10/27/2016 FINDINGS: Brain: No evidence of acute infarction, hemorrhage, hydrocephalus, extra-axial collection or mass lesion/mass effect. There is mild diffuse atrophy. There is mild periventricular white matter hypodensity, likely chronic small vessel ischemic change. Vascular: Atherosclerotic calcifications are present within the cavernous internal carotid arteries. Skull: Normal. Negative for fracture or focal lesion. Sinuses/Orbits: No acute finding. Other: None. IMPRESSION: 1. No acute intracranial process. 2. Mild atrophy and chronic small vessel ischemic changes. Electronically Signed   By: Darliss Cheney M.D.   On: 05/22/2023 15:23    Procedures Procedures    Medications Ordered in ED Medications - No data to display  ED Course/ Medical Decision Making/ A&P Clinical Course as of 05/22/23 1644  Tue May 22, 2023  1619 Stable 74 YOF with a chief complaint of AMS, poor PO intake. Recently moved up from [place]. UA pending.  Patient well appearing. Infectious rule out and dispo anticipated. [CC]    Clinical Course User Index [CC] Glyn Ade, MD                             Medical Decision Making Amount and/or Complexity of Data Reviewed Labs: ordered. Radiology: ordered.   This patient presents to the ED for concern of confusion and memory loss, this involves an extensive number of treatment options, and is a complaint that carries with it a high risk of complications and morbidity.  The differential diagnosis includes progression of neurocognitive disorder, delirium, infection, metabolic derangements, polypharmacy   Co morbidities that complicate the patient evaluation  DM, HLD, anemia, ESRD, CHF, multiple myeloma, GERD, hypothyroidism   Additional history obtained:  Additional history obtained from patient's daughter External records from outside source obtained and reviewed  including EMR   Lab Tests:  I Ordered, and personally interpreted labs.  The pertinent results include: Baseline elevation in creatinine, baseline anemia, no leukocytosis   Imaging Studies ordered:  I ordered imaging studies including chest x-ray, CT head I independently visualized and interpreted imaging which showed no acute findings I agree with the radiologist interpretation   Cardiac Monitoring: / EKG:  The patient was maintained on a cardiac monitor.  I personally viewed and interpreted the cardiac monitored which showed an underlying rhythm of: Sinus rhythm   Problem List / ED Course / Critical interventions / Medication management  Patient presents for decreased activity and increased confusion since her recent hospitalization.  She was hospitalized last week for hypoxic respiratory failure.  She underwent thoracentesis, dialysis, balloon angioplasty of occluded right innominate vein.  She has since had improvement in her right arm swelling.  She underwent dialysis yesterday.  Daughter is concerned with changes in her mental status.  On arrival in the ED, patient is well-appearing.  She has no focal neurologic deficits.  She has no complaints.  Dipstick urinalysis from urgent care was unremarkable.  Will check microscopic urinalysis here given the large concerns for UTI, which patient reportedly has had frequently in the past.  Additional workup was ordered.  Imaging studies and initial lab work results were unremarkable.  At time of signout, urinalysis was pending acquisition of a sample.  I spoke with the patient and daughter regarding what the neck step might be.  Patient is in favor of discharge home and continues to state that she feels fine.  Daughter is comfortable with discharge home and neurology follow-up.  Contact information was provided.  Care of patient signed out to oncoming ED provider.   Social Determinants of Health:  Lives at home with  daughter         Final Clinical Impression(s) / ED Diagnoses Final diagnoses:  Memory loss    Rx / DC Orders ED Discharge Orders          Ordered    Ambulatory referral to Neurology       Comments: An appointment is requested in approximately: 1 week   05/22/23 1603              Gloris Manchester, MD 05/22/23 [phone]

## 2023-05-22 NOTE — ED Provider Notes (Signed)
Care of patient received from prior provider at 4:20 PM, please see their note for complete H/P and care plan.  Received handoff per ED course.  Clinical Course as of 05/22/23 1620  Tue May 22, 2023  1619 Stable 41 YOF with a chief complaint of AMS, poor PO intake. Recently moved up from [place]. UA pending.  Patient well appearing. Infectious rule out and dispo anticipated. [CC]    Clinical Course User Index [CC] Glyn Ade, MD    Reassessment: Reassessed at bedside.  Symptomatically back to baseline.  Was waiting on a urine sample but patient only urinates once a day and provided a sample at urgent care with no pathology detected at that time.  Discussed with family.  They feel she is back to her baseline comfortable with outpatient care management  Disposition:  I have considered need for hospitalization, however, considering all of the above, I believe this patient is stable for discharge at this time.  Patient/family educated about specific return precautions for given chief complaint and symptoms.  Patient/family educated about follow-up with PCP.     Patient/family expressed understanding of return precautions and need for follow-up. Patient spoken to regarding all imaging and laboratory results and appropriate follow up for these results. All education provided in verbal form with additional information in written form. Time was allowed for answering of patient questions. Patient discharged.    Emergency Department Medication Summary:   Medications - No data to display          Glyn Ade, MD 05/22/23 1610

## 2023-05-22 NOTE — ED Triage Notes (Signed)
Pt is feeling very confused, lethargic x 3 days. Daughter providing history today. Pt is on dialysis. Daughter isnt sure what exactly is wrong with her but she would like to start with urine.

## 2023-05-22 NOTE — ED Notes (Signed)
Patient is being discharged from the Urgent Care and sent to the Emergency Department via POV . Per [employer], PA, patient is in need of higher level of care due to complex sx. Patient is aware and verbalizes understanding of plan of care.  Vitals:   05/22/23 1223  BP: (!) 185/69  Pulse: 79  Resp: 18  Temp: 97.8 F (36.6 C)  SpO2: 96%

## 2023-05-22 NOTE — ED Notes (Signed)
Unable to collect urine. Pt attempted and was unsuccessful

## 2023-05-23 ENCOUNTER — Other Ambulatory Visit: Payer: Self-pay

## 2023-05-23 DIAGNOSIS — Z992 Dependence on renal dialysis: Secondary | ICD-10-CM

## 2023-05-24 ENCOUNTER — Encounter: Payer: Self-pay | Admitting: Cardiology

## 2023-05-24 ENCOUNTER — Ambulatory Visit: Payer: 59 | Attending: Cardiology | Admitting: Cardiology

## 2023-05-24 VITALS — BP 124/76 | HR 55 | Ht 62.0 in | Wt 135.2 lb

## 2023-05-24 DIAGNOSIS — I2510 Atherosclerotic heart disease of native coronary artery without angina pectoris: Secondary | ICD-10-CM | POA: Diagnosis not present

## 2023-05-24 DIAGNOSIS — I4891 Unspecified atrial fibrillation: Secondary | ICD-10-CM | POA: Diagnosis not present

## 2023-05-24 DIAGNOSIS — I10 Essential (primary) hypertension: Secondary | ICD-10-CM | POA: Diagnosis not present

## 2023-05-24 DIAGNOSIS — E1039 Type 1 diabetes mellitus with other diabetic ophthalmic complication: Secondary | ICD-10-CM

## 2023-05-24 DIAGNOSIS — I420 Dilated cardiomyopathy: Secondary | ICD-10-CM

## 2023-05-24 DIAGNOSIS — I5032 Chronic diastolic (congestive) heart failure: Secondary | ICD-10-CM

## 2023-05-24 NOTE — Addendum Note (Signed)
Addended by: Baldo Ash D on: 05/24/2023 11:12 AM   Modules accepted: Orders

## 2023-05-24 NOTE — Patient Instructions (Addendum)
Medication Instructions:  Your physician recommends that you continue on your current medications as directed. Please refer to the Current Medication list given to you today.  *If you need a refill on your cardiac medications before your next appointment, please call your pharmacy*   Lab Work: Your physician recommends that you return for lab work in: when you have dialysis You need to have labs done when you are fasting.  You can come Monday through Friday 8:30 am to 12:00 pm and 1:15 to 4:30. You do not need to make an appointment as the order has already been placed. The labs you are going to have done are Lipids.    Testing/Procedures: Your physician has requested that you have an echocardiogram. Echocardiography is a painless test that uses sound waves to create images of your heart. It provides your doctor with information about the size and shape of your heart and how well your heart's chambers and valves are working. This procedure takes approximately one hour. There are no restrictions for this procedure. Please do NOT wear cologne, perfume, aftershave, or lotions (deodorant is allowed). Please arrive 15 minutes prior to your appointment time.    Follow-Up: At [facility], you and your health needs are our priority.  As part of our continuing mission to provide you with exceptional heart care, we have created designated Provider Care Teams.  These Care Teams include your primary Cardiologist (physician) and Advanced Practice Providers (APPs -  Physician Assistants and Nurse Practitioners) who all work together to provide you with the care you need, when you need it.  We recommend signing up for the patient portal called "MyChart".  Sign up information is provided on this After Visit Summary.  MyChart is used to connect with patients for Virtual Visits (Telemedicine).  Patients are able to view lab/test results, encounter notes, upcoming appointments, etc.  Non-urgent messages can be  sent to your provider as well.   To learn more about what you can do with MyChart, go to [email or web address].    Your next appointment:   2 month(s)- [place]  The format for your next appointment:   In Person  Provider:   Gypsy Balsam, MD    Other Instructions NA

## 2023-05-24 NOTE — Progress Notes (Signed)
Cardiology Consultation:    Date:  05/24/2023   ID:  [name], DOB [birth year], MRN [id number]  PCP:  [facility]  Cardiologist:  Gypsy Balsam, MD   Referring MD: [facility]   Chief Complaint  Patient presents with   Blood Pressure Check   Atrial Fibrillation    History of Present Illness:    Elizabeth Olsen is a 82 y.o. female who is being seen today for the evaluation of past hospital visit at the request of [facility]  I did see Aleya last time in June 2021 at that time we were dealing with diastolic congestive heart failure.  She also got chronic kidney failure that gradually progressed to the point that now she is on dialysis.  Initially she was on peritoneal dialysis but now she does have a fistula and hemodialysis has been initiated.  She went to [place] to join her daughter in taking care of her husband who eventually passed now they relocated back to our area.  I did review her records she does have multiple visits in the hospital for different reasons.  Last visit was altered mental status as well as some shortness of breath.  She did have maximized her dialysis and a lot of fluid has been drained she also required thoracocentesis.  Her daughter tells me that while she was in [place] 2 years ago she required stenting 3 stents were placed look like some into the right coronary artery heart but I have no more details regarding that event.  Will get more information.  She comes today to be reestablished with [place] as a patient.  Doing fair, depressed after her husband passing.  Tolerated dialysis quite well.  Did have episode of atrial fibrillation for which she is anticoagulated but no antiarrhythmic therapy.  Past Medical History:  Diagnosis Date   Acquired hypothyroidism 10/13/2017   Acute diastolic CHF (congestive heart failure) (HCC) 11/20/2019   Acute respiratory failure with hypoxia (HCC) 10/13/2017   Acute systolic heart  failure (HCC) 95/18/8416   Anemia    Anemia associated with diabetes mellitus (HCC) 10/13/2017   Anemia of chronic disease 12/10/2017   Blood transfusion    Blood transfusion without reported diagnosis    with breast surgery   Breast cancer (HCC) 15 years ago   left    Cardiomyopathy (HCC) 07/23/2017   Ejection fraction 45-50% may; from September 2018   CHF (congestive heart failure) (HCC)    Chronic cholecystitis with calculus 01/22/2018   Colon polyp 03/2007   adenomatous   COVID-19 virus infection 11/20/2019   Diabetes mellitus    dx 1998.  was told prior to getting chemo that her bld sugar rose.  She thought it would go back   Dyspnea    d/t anemia   Essential hypertension 09/11/2018   GERD (gastroesophageal reflux disease)    Heart murmur    as child   Hernia, incisional    Hyperlipidemia    Hypertension    Hypothyroidism    Iron deficiency 07/23/2017   Near syncope 04/01/2018   Refractory anemia, unspecified (HCC) 12/10/2017   Type 1 diabetes (HCC) 07/23/2017   Vertigo 06/24/2018   Vitamin D deficiency     Past Surgical History:  Procedure Laterality Date   A/V FISTULAGRAM N/A 05/16/2023   Procedure: A/V Fistulagram;  Surgeon: Victorino Sparrow, MD;  Location: [facility] INVASIVE CV LAB;  Service: Cardiovascular;  Laterality: N/A;   ABDOMINAL HYSTERECTOMY  APPENDECTOMY     BREAST SURGERY     CHOLECYSTECTOMY  01/22/2018   LAPROSCOPIC    CHOLECYSTECTOMY N/A 01/22/2018   Procedure: LAPAROSCOPIC CHOLECYSTECTOMY WITH INTRAOPERATIVE CHOLANGIOGRAM;  Surgeon: Manus Rudd, MD;  Location: [facility] OR;  Service: General;  Laterality: N/A;   COLONOSCOPY  2013   due next 03-2017   EYE SURGERY     bil cataracts   IUD REMOVAL     with appendectomy   MASTECTOMY, MODIFIED RADICAL W/RECONSTRUCTION Left 15 years ago   10 nodes out   PERIPHERAL VASCULAR INTERVENTION  05/16/2023   Procedure: PERIPHERAL VASCULAR INTERVENTION;  Surgeon: Victorino Sparrow, MD;  [facility] INV[facility]IVE CV LAB;  Service:  Cardiovascular;;   THORACENTESIS Right 05/17/2023   Procedure: THORACENTESIS;  Surgeon: Lorin Glass, MD;  Location: [facility] ENDOSCOPY;  Service: Pulmonary;  Laterality: Right;   TUMOR EXCISION Left    x 2, neck, head    Current Medications: Current Meds  Medication Sig   apixaban (ELIQUIS) 2.5 MG TABS tablet Take 1 tablet (2.5 mg total) by mouth 2 (two) times daily.   cloNIDine (CATAPRES) 0.1 MG tablet Take 1 tablet (0.1 mg total) by mouth daily as needed (FOR SYSTOLIC BLOOD PRESSURE GREATER THAN 170 AND OR DIASTOLIC FOR BLOOD PRESSURE GREATER 100).   Continuous Blood Gluc Sensor (FREESTYLE LIBRE 14 DAY SENSOR) MISC 1 each by Other route See admin instructions.   HUMALOG 100 UNIT/ML injection Inject 50-100 Units into the skin See admin instructions. Pump   Insulin Disposable Pump (OMNIPOD DASH 5 PACK PODS) MISC SMARTSIG:SUB-Q Every 3 Days   insulin glargine (LANTUS SOLOSTAR) 100 UNIT/ML Solostar Pen Inject 1-100 Units into the skin in the morning. Unknown Sliding scale   Insulin Syringe-Needle U-100 (ULTICARE INSULIN SYRINGE) 30G X 1/2" 0.5 ML MISC Please use as previously directed (Patient taking differently: 1 each by Other route See admin instructions. Please use as previously directed)   levothyroxine (SYNTHROID) 88 MCG tablet Take 1 tablet (88 mcg total) by mouth daily at 6 (six) AM.   lidocaine-prilocaine (EMLA) cream Apply 1 Application topically once.   nystatin (MYCOSTATIN/NYSTOP) powder Apply topically 2 (two) times daily. (Patient taking differently: Apply 1 Application topically 2 (two) times daily.)   ONETOUCH VERIO test strip 1 each as needed for other (Glucose).   [DISCONTINUED] metoprolol tartrate (LOPRESSOR) 25 MG tablet Take 1/2 tablet (12.5 mg total) by mouth 2 (two) times daily as needed (If heart rate above 100 OR Systolic blood pressure above 161).     Allergies:   Erythromycin, Penicillins, Doxylamine-dm, Nyquil [pseudoeph-doxylamine-dm-apap], and Sulfa antibiotics    Social History   Socioeconomic History   Marital status: Married    Spouse name: Not on file   Number of children: 3   Years of education: Not on file   Highest education level: Not on file  Occupational History   Occupation: homemaker  Tobacco Use   Smoking status: Never   Smokeless tobacco: Never  Vaping Use   Vaping status: Never Used  Substance and Sexual Activity   Alcohol use: No   Drug use: No   Sexual activity: Not on file  Other Topics Concern   Not on file  Social History Narrative   Not on file   Social Determinants of Health   Financial Resource Strain: Not on file  Food Insecurity: No Food Insecurity (05/16/2023)   Hunger Vital Sign    Worried About Running Out of Food in the Last Year: Never true    Ran  Out of Food in the Last Year: Never true  Transportation Needs: No Transportation Needs (05/16/2023)   PRAPARE - Administrator, Civil Service (Medical): No    Lack of Transportation (Non-Medical): No  Physical Activity: Not on file  Stress: Not on file  Social Connections: Not on file     Family History: The patient's family history includes Congestive Heart Failure in her father; Diabetes in an other family member; Heart attack in her brother; Hypertension in her father, maternal grandfather, and maternal grandmother; Peripheral vascular disease in her father; Stomach cancer in her maternal grandmother. There is no history of Colon cancer, Esophageal cancer, Pancreatic cancer, or Rectal cancer. ROS:   Please see the history of present illness.    All 14 point review of systems negative except as described per history of present illness.  EKGs/Labs/Other Studies Reviewed:    The following studies were reviewed today:   EKG:  EKG Interpretation Date/Time:  Thursday May 24 2023 10:26:24 EDT Ventricular Rate:  75 PR Interval:  172 QRS Duration:  104 QT Interval:  420 QTC Calculation: 469 R Axis:   111  Text Interpretation: Normal  sinus rhythm Right axis deviation Abnormal ECG When compared with ECG of 22-May-2023 12:56, No significant change was found Confirmed by Gypsy Balsam [phone]) on 05/24/2023 10:34:33 AM    Recent Labs: 05/05/2023: B Natriuretic Peptide 559.9 05/22/2023: ALT 13; BUN 30; Creatinine, Ser 2.81; Hemoglobin 9.3; Platelets 273; Potassium 4.2; Sodium 133; TSH 0.689  Recent Lipid Panel    Component Value Date/Time   CHOL 184 07/23/2017 1538   TRIG 91 07/23/2017 1538   HDL 77 07/23/2017 1538   CHOLHDL 2.4 07/23/2017 1538   LDLCALC 89 07/23/2017 1538    Physical Exam:    VS:  BP 124/76 (BP Location: Left Arm, Patient Position: Sitting)   Pulse (!) 55   Ht 5\' 2"  (1.575 m)   Wt 135 lb 3.2 oz (61.3 kg)   SpO2 94%   BMI 24.73 kg/m     Wt Readings from Last 3 Encounters:  05/24/23 135 lb 3.2 oz (61.3 kg)  05/22/23 125 lb 3.5 oz (56.8 kg)  05/18/23 125 lb 3.5 oz (56.8 kg)     GEN:  Well nourished, well developed in no acute distress HEENT: Normal NECK: No JVD; No carotid bruits LYMPHATICS: No lymphadenopathy CARDIAC: RRR, systolic murmur grade 2/6 best heard left border of the sternum, no rubs, no gallops RESPIRATORY:  Clear to auscultation without rales, wheezing or rhonchi  ABDOMEN: Soft, non-tender, non-distended MUSCULOSKELETAL:  No edema; No deformity  SKIN: Warm and dry NEUROLOGIC:  Alert and oriented x 3 PSYCHIATRIC:  Normal affect   ASSESSMENT:    1. Essential hypertension   2. Atherosclerosis of native coronary artery of native heart without angina pectoris   3. Atrial fibrillation with RVR (HCC)   4. Dilated cardiomyopathy (HCC)   5. Chronic diastolic congestive heart failure (HCC)   6. 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 Paroxysmal atrial fibrillation, she is anticoag with Eliquis  dose is 2.5 twice daily which is appropriate.  Denies having the palpitations.  Thinking was that she did have a problem with atrial fibrillation because of hypothyroidism.  Now things are stable and she denies having any repeat occurrences. Pulmonary hypertension with pulmonary pressure of  70 mmHg check multifactorial issue.  I suspect there is some component of group 2 related to diastolic dysfunction as well as mild to moderate mitral valve regurgitation.  It could be also related to dialysis.  Last echocardiogram that have from before 3 years ago showed pulmonary pressure of 35 to 40 mmHg now with talking about 65-70.  However I think the best approach for this situation will be to repeat her echocardiogram when she is hemodynamically stable which she is right now and so I will schedule her to have echocardiogram in about a month in ICU shortly thereafter. Coronary artery disease status post coronary stenting done in [place] 2 years ago I have no documentation of this we will try to get records to get a better understanding of what happened. Dyslipidemia I do not see any recent fasting lipid profile.  Will schedule her to have this test.  I do not see her on any statin and she should be on it.   Medication Adjustments/Labs and Tests Ordered: Current medicines are reviewed at length with the patient today.  Concerns regarding medicines are outlined above.  Orders Placed This Encounter  Procedures   EKG 12-Lead   No orders of the defined types were placed in this encounter.   Signed, Georgeanna Lea, MD, [facility]. 05/24/2023 10:57 AM    [facility]

## 2023-05-29 ENCOUNTER — Encounter (HOSPITAL_COMMUNITY): Payer: Self-pay | Admitting: Vascular Surgery

## 2023-05-29 ENCOUNTER — Other Ambulatory Visit: Payer: Self-pay

## 2023-05-29 NOTE — Progress Notes (Signed)
PCP - Dr. Hannah Beat. Otis Dials (in [place]) Pt was seeing this PCP in [place], has not established PCP here because her daughter says she's not sure if she will be going back to [place]  Cardiologist - Dr. Gypsy Balsam  PPM/ICD - denies   Chest x-ray - 05/22/23 EKG - 05/24/23 Stress Test - 07/27/17 ECHO - 05/16/23 Cardiac Cath - 2022-2023 in [place] (Pt's daughter states pt had 2 stents placed  CPAP - denies  Fasting Blood Sugar - 100-120 Checks Blood Sugar continuously/day (continuous sensor)  Blood Thinner Instructions: Hold Eliquis 3 days. Last dose 7/27 Aspirin Instructions: n/a  ERAS Protcol - NPO  COVID TEST- n/a  Anesthesia review: yes, cardiac hx  Patient verbally denies any shortness of breath, fever, cough and chest pain during phone call   -------------  SDW INSTRUCTIONS given:  Your procedure is scheduled on 8/1.  Report to [facility] Main Entrance "A" at 0530 A.M., and check in at the Admitting office.  Call this number if you have problems the morning of surgery:  [phone]   Remember:  Do not eat or drink after midnight the night before your surgery      Take these medicines the morning of surgery with A SIP OF WATER  Clonidine PRN Levothyroxine   As of today, STOP taking any Aspirin (unless otherwise instructed by your surgeon) Aleve, Naproxen, Ibuprofen, Motrin, Advil, Goody's, BC's, all herbal medications, fish oil, and all vitamins.                      Do not wear jewelry, make up, or nail polish            Do not wear lotions, powders, perfumes/colognes, or deodorant.            Do not shave 48 hours prior to surgery.  Men may shave face and neck.            Do not bring valuables to the hospital.            [facility] is not responsible for any belongings or valuables.  Do NOT Smoke (Tobacco/Vaping) 24 hours prior to your procedure If you use a CPAP at night, you may bring all equipment for your overnight stay.   Contacts, glasses,  dentures or bridgework may not be worn into surgery.      For patients admitted to the hospital, discharge time will be determined by your treatment team.   Patients discharged the day of surgery will not be allowed to drive home, and someone needs to stay with them for 24 hours.    Special instructions:   [place]- Preparing For Surgery  Before surgery, you can play an important role. Because skin is not sterile, your skin needs to be as free of germs as possible. You can reduce the number of germs on your skin by washing with CHG (chlorahexidine gluconate) Soap before surgery.  CHG is an antiseptic cleaner which kills germs and bonds with the skin to continue killing germs even after washing.    Oral Hygiene is also important to reduce your risk of infection.  Remember - BRUSH YOUR TEETH THE MORNING OF SURGERY WITH YOUR REGULAR TOOTHPASTE  Please do not use if you have an allergy to CHG or antibacterial soaps. If your skin becomes reddened/irritated stop using the CHG.  Do not shave (including legs and underarms) for at least 48 hours prior to first CHG shower. It is OK to  shave your face.  Please follow these instructions carefully.   Shower the NIGHT BEFORE SURGERY and the MORNING OF SURGERY with DIAL Soap.   Pat yourself dry with a CLEAN TOWEL.  Wear CLEAN PAJAMAS to bed the night before surgery  Place CLEAN SHEETS on your bed the night of your first shower and DO NOT SLEEP WITH PETS.   Day of Surgery: Please shower morning of surgery  Wear Clean/Comfortable clothing the morning of surgery Do not apply any deodorants/lotions.   Remember to brush your teeth WITH YOUR REGULAR TOOTHPASTE.   Questions were answered. Patient verbalized understanding of instructions.

## 2023-05-30 ENCOUNTER — Encounter: Payer: Self-pay | Admitting: Physician Assistant

## 2023-05-30 ENCOUNTER — Encounter (HOSPITAL_COMMUNITY): Payer: Self-pay | Admitting: Vascular Surgery

## 2023-05-30 NOTE — Progress Notes (Signed)
Anesthesia Chart Review: Elizabeth Olsen  Case: [id number] Date/Time: 05/31/23 0715   Procedure: LAPAROSCOPIC REMOVAL CONTINUOUS AMBULATORY PERITONEAL DIALYSIS  (CAPD) CATHETER   Anesthesia type: Choice   Pre-op diagnosis: Presence of peritoneal dialysis catheter   [facility]: [facility] ROOM 16 / [facility]   Surgeons: [name], MD       DISCUSSION: Patient is an 82 year old female scheduled for the above procedure.   History includes never smoker, post-operative N/V, HTN, HLD, CAD (daughter reported patient had stents x 2 ~ 2022 in [facility]), chronic systolic and diastolic CHF, cardiomyopathy (9518, non-ischemic stress test 07/27/17), murmur (mild-moderate MR 05/16/23), afib (05/04/23), ESRD, anemia, IDDM, hypothyroidism, neuropathy, short-term memory loss, near syncope (2019), breast cancer (s/p left modified radical mastectomy with reconstruction, chemotherapy 1998), hysterectomy, cholecystectomy (01/22/18).   [facility] admission 05/04/23 - 05/06/23 for need for hemodialysis. She apparently divides her time between [place] and [place]. She had been undergoing hemodialysis, but had a PD catheter placed by Dr. Grover Canavan in [facility] on 03/21/23 with revision on 04/24/25. It sounds like she was continuing with hemodialysis while she was training for PD. She had arrived in [place] on 05/02/23 with understanding she would have hemodialysis at [facility] on [facility] on 05/05/23, but facility had not received paperwork from [place], so she was sent to [facility] ED for evaluation and hemodialysis treatment. Following hemodialysis she was noted to be in afib with RVR and required admission. She was started on low dose diltiazem and cardiology consulted. Discharged on Eliquis (instead of Plavix), metoprolol tartrate 25 mg BID, and levothyroxine decreased to 85 mcg daily. Out-patient cardiology follow-up with Dr. Bing Matter planned, whom she had seen previously in 2018 - 2021.    admission 05/16/23 - 05/18/23 for  worsening dyspnea and RUE/breast swelling in setting of RUE AVF. Had been going to TCU for HD while undergoing PD training. She underwent right subclavian, right innominate vein, SVC balloon venoplasty for occlusion of right innominate vein on 05/16/23 by Dr. Gerarda Fraction. She also had right pleural effusion on CXR and underwent right thoracentesis on 05/17/23 by Dr. Levon Hedger.  Nephrology managed hemodialysis. She ultimately opted to no longer pursue PD, so out-patient removal of PD catheter planned. Echo updated on 05/16/23 showing LVEF 50-55%, no wall motion abnormalities, mild concentric LVH, grade 3 diastolic dysfunction (restrictive), normal RV systolic function, severely elevated PASP with RVSP 69.2 mmHg, mild to moderate MR, mild AR. She was discharged home with nephrology and cardiology follow-up. ED visit 05/22/23 for confusion, worse or persistent since hospital discharge with decreased oral intake and persistent hyperglycemia > 200.  No focal neurologic defects on exam. Head CT without acute findings. Labs felt stable. Discharged home with daughter with ongoing nephrology follow-up and consideration of neurology referral.   Last cardiology visit with Dr. Bing Matter was on 05/24/23. He reviewed recent hospitalization and ED records. HTN controlled. PAF in setting of hyperthyroid (low TSH), and had since had dose adjustment in levothyroxine. TSH 0.689 on 05/22/23. He noted recent echo showing elevated PA pressure 69.2 mmHg. He suspected multifactorial etiology with some component of group 2 related to diastolic dysfunction as well as mild-moderate MR, and event dialysis. It sounds like echo was done when she was not maximized on hemodialysis. He plans to repeat echo in about a month as her volume status improved on hemodialysis. He will attempt to get records from reported coronary stents (RCA?) in [place] from ~ 2-3 years ago. Two month follow-up planned.  Per [facility], hold Eliquis for 3 days prior to  surgery. Last dose reported as 05/26/23. Anesthesia team to evaluate on the day of surgery.    VS: Ht 5\' 2"  (1.575 m)   Wt 60.8 kg   BMI 24.51 kg/m  BP Readings from Last 3 Encounters:  05/24/23 124/76  05/22/23 (!) 164/73  05/22/23 (!) 185/69   Pulse Readings from Last 3 Encounters:  05/24/23 (!) 55  05/22/23 87  05/22/23 79     PROVIDERS: [facility] - She reported seeing PCP Dr. Hannah Beat. Andreu in [place], but more recently had been in [place] with her daughter  - Bing Matter, Molly Maduro, MD is cardiologist   LABS: For day of surgery. Most recent labs in [facility] include: Lab Results  Component Value Date   WBC 8.4 05/22/2023   HGB 9.3 (L) 05/22/2023   HCT 31.0 (L) 05/22/2023   PLT 273 05/22/2023   GLUCOSE 150 (H) 05/22/2023   ALT 13 05/22/2023   AST 20 05/22/2023   NA 133 (L) 05/22/2023   K 4.2 05/22/2023   CL 96 (L) 05/22/2023   CREATININE 2.81 (H) 05/22/2023   BUN 30 (H) 05/22/2023   CO2 26 05/22/2023   TSH 0.689 05/22/2023   HGBA1C 5.2 05/16/2023     IMAGES: 1V PCXR 05/22/23: IMPRESSION: Cardiomegaly. Central pulmonary vessels are more prominent suggesting CHF. Increased markings are seen in right lower lung fields suggesting asymmetric pulmonary edema or interstitial pneumonia. Small right pleural effusion.  CT Head 05/22/23: IMPRESSION: 1. No acute intracranial process. 2. Mild atrophy and chronic small vessel ischemic changes.  EKG: 05/24/23:  Normal sinus rhythm Right axis deviation Abnormal ECG When compared with ECG of 22-May-2023 12:56, No significant change was found Confirmed by Gypsy Balsam [phone]) on 05/24/2023 10:34:33 AM  CV: Echo 05/16/23: IMPRESSIONS   1. Left ventricular ejection fraction, by estimation, is 50 to 55%. The  left ventricle has low normal function. The left ventricle has no regional  wall motion abnormalities. There is mild concentric left ventricular  hypertrophy. Left ventricular  diastolic  parameters are consistent with Grade III diastolic dysfunction  (restrictive).   2. Right ventricular systolic function is normal. The right ventricular  size is normal. There is severely elevated pulmonary artery systolic  pressure. The estimated right ventricular systolic pressure is 69.2 mmHg.   3. Left atrial size was mildly dilated.   4. The mitral valve is abnormal. Mild to moderate mitral valve  regurgitation. No evidence of mitral stenosis. Moderate mitral annular  calcification.   5. The aortic valve was not well visualized. There is moderate  calcification of the aortic valve. Aortic valve regurgitation is mild.  Aortic valve sclerosis/calcification is present, without any evidence of  aortic stenosis. Aortic valve mean gradient  measures 8.0 mmHg.   6. The inferior vena cava is normal in size with <50% respiratory  variability, suggesting right atrial pressure of 8 mmHg.  - Comparison 11/20/19 LVEF 55-60%, grade 1 DD, no RWMA, mild MR/AR, RVSP 32.4 mmHg; 10/08/19 & 09/30/18 LVEF 55-60%; 10/13/17 LVEF 45-50% diffuse hypokinesis; 07/18/17 LVEF 45-50%    Long term (5 day) monitor 04/08/18: - Baseline rhythm: Normal sinus rhythm - Minimum heart rate: 60 BPM.  Average heart rate: 76 BPM.  Maximal heart rate 96 BPM. - Atrial arrhythmia: Infrequent premature supraventricular beats with 2 triplets - Ventricular arrhythmia: Total of 98 premature ventricular beats without sustained arrhythmia - Conduction abnormality: None - Symptoms: None Conclusion:  Normal 5 days Holter  monitor with doubt significant arrhythmia that would justified her syncope.  Infrequent PVCs and APCs.  No sustained arrhythmia.    Nuclear stress test 07/27/17: Nuclear stress EF: 53%. There was no ST segment deviation noted during stress. The study is normal. This is a low risk study. The left ventricular ejection fraction is mildly decreased (45-54%).  Mild diffuse hypokinesis. No evidence for prior infarct or  ischemia.    Past Medical History:  Diagnosis Date   A-fib (HCC)    Acquired hypothyroidism 10/13/2017   Acute diastolic CHF (congestive heart failure) (HCC) 11/20/2019   Acute respiratory failure with hypoxia (HCC) 10/13/2017   Acute systolic heart failure (HCC) 10/13/2017   Anemia    Anemia associated with diabetes mellitus (HCC) 10/13/2017   Anemia of chronic disease 12/10/2017   Blood transfusion    Blood transfusion without reported diagnosis    with breast surgery   Breast cancer (HCC) 15 years ago   left    Cardiomyopathy (HCC) 07/23/2017   Ejection fraction 45-50% may; from September 2018   CHF (congestive heart failure) (HCC)    Chronic cholecystitis with calculus 01/22/2018   Colon polyp 03/2007   adenomatous   COVID-19 virus infection 11/20/2019   Diabetes mellitus    dx 1998.  was told prior to getting chemo that her bld sugar rose.  She thought it would go back   Dyspnea    d/t anemia   ESRD (end stage renal disease) on dialysis (HCC)    HD MWF   Essential hypertension 09/11/2018   GERD (gastroesophageal reflux disease)    Heart murmur    as child   Hernia, incisional    Hyperlipidemia    Hypertension    Hypothyroidism    Iron deficiency 07/23/2017   Near syncope 04/01/2018   Neuropathy    Osteoarthritis, hip, bilateral    PONV (postoperative nausea and vomiting)    Refractory anemia, unspecified (HCC) 12/10/2017   Short-term memory loss    per pt's daughter   Type 1 diabetes (HCC) 07/23/2017   Vertigo 06/24/2018   Vitamin D deficiency     Past Surgical History:  Procedure Laterality Date   A/V FISTULAGRAM N/A 05/16/2023   Procedure: A/V Fistulagram;  Surgeon: Victorino Sparrow, MD;  Location: [facility] INVASIVE CV LAB;  Service: Cardiovascular;  Laterality: N/A;   ABDOMINAL HYSTERECTOMY     APPENDECTOMY     BREAST SURGERY     CHOLECYSTECTOMY  01/22/2018   LAPROSCOPIC    CHOLECYSTECTOMY N/A 01/22/2018   Procedure: LAPAROSCOPIC CHOLECYSTECTOMY WITH  INTRAOPERATIVE CHOLANGIOGRAM;  Surgeon: Manus Rudd, MD;  Location: [facility] OR;  Service: General;  Laterality: N/A;   COLONOSCOPY  2013   due next 03-2017   EYE SURGERY     bil cataracts   IUD REMOVAL     with appendectomy   MASTECTOMY, MODIFIED RADICAL W/RECONSTRUCTION Left 1998   10 nodes out   PERIPHERAL VASCULAR INTERVENTION  05/16/2023   Procedure: PERIPHERAL VASCULAR INTERVENTION;  Surgeon: Victorino Sparrow, MD;  Location: [facility] INVASIVE CV LAB;  Service: Cardiovascular;;   THORACENTESIS Right 05/17/2023   Procedure: THORACENTESIS;  Surgeon: Lorin Glass, MD;  Location: [facility] ENDOSCOPY;  Service: Pulmonary;  Laterality: Right;   TUMOR EXCISION Left    x 2, neck, head    MEDICATIONS: No current facility-administered medications for this encounter.    apixaban (ELIQUIS) 2.5 MG TABS tablet   cloNIDine (CATAPRES) 0.1 MG tablet   Continuous Blood Gluc Sensor (FREESTYLE LIBRE 14 DAY  SENSOR) MISC   HUMALOG 100 UNIT/ML injection   Insulin Disposable Pump (OMNIPOD DASH 5 PACK PODS) MISC   insulin glargine (LANTUS SOLOSTAR) 100 UNIT/ML Solostar Pen   Insulin Syringe-Needle U-100 (ULTICARE INSULIN SYRINGE) 30G X 1/2" 0.5 ML MISC   levothyroxine (SYNTHROID) 88 MCG tablet   lidocaine-prilocaine (EMLA) cream   nystatin (MYCOSTATIN/NYSTOP) powder   ONETOUCH VERIO test strip    Shonna Chock, PA-C Surgical Short Stay/Anesthesiology [facility] Phone [phone] [facility] Phone [phone] 05/30/2023 1:19 PM

## 2023-05-30 NOTE — Anesthesia Preprocedure Evaluation (Addendum)
Anesthesia Evaluation  Patient identified by MRN, date of birth, ID band Patient awake    Reviewed: Allergy & Precautions, NPO status , Patient's Chart, lab work & pertinent test results  History of Anesthesia Complications (+) PONV and history of anesthetic complications  Airway Mallampati: II  TM Distance: >3 FB Neck ROM: Full    Dental no notable dental hx.    Pulmonary    Pulmonary exam normal        Cardiovascular hypertension, Pt. on medications + CAD and +CHF   Rhythm:Regular Rate:Normal  Echo 05/16/23: IMPRESSIONS  1. Left ventricular ejection fraction, by estimation, is 50 to 55%. The  left ventricle has low normal function. The left ventricle has no regional  wall motion abnormalities. There is mild concentric left ventricular  hypertrophy. Left ventricular  diastolic parameters are consistent with Grade III diastolic dysfunction  (restrictive).  2. Right ventricular systolic function is normal. The right ventricular  size is normal. There is severely elevated pulmonary artery systolic  pressure. The estimated right ventricular systolic pressure is 69.2 mmHg.  3. Left atrial size was mildly dilated.  4. The mitral valve is abnormal. Mild to moderate mitral valve  regurgitation. No evidence of mitral stenosis. Moderate mitral annular  calcification.  5. The aortic valve was not well visualized. There is moderate  calcification of the aortic valve. Aortic valve regurgitation is mild.  Aortic valve sclerosis/calcification is present, without any evidence of  aortic stenosis. Aortic valve mean gradient  measures 8.0 mmHg.  6. The inferior vena cava is normal in size with <50% respiratory  variability, suggesting right atrial pressure of 8 mmHg.     Neuro/Psych negative neurological ROS  negative psych ROS   GI/Hepatic Neg liver ROS,GERD  ,,  Endo/Other  diabetes, Type 1, Insulin DependentHypothyroidism     Renal/GU ESRF and DialysisRenal disease  negative genitourinary   Musculoskeletal  (+) Arthritis , Osteoarthritis,    Abdominal Normal abdominal exam  (+)   Peds  Hematology  (+) Blood dyscrasia, anemia Lab Results      Component                Value               Date                      WBC                      8.4                 05/22/2023                HGB                      9.3 (L)             05/22/2023                HCT                      31.0 (L)            05/22/2023                MCV                      100.3 (H)  05/22/2023                PLT                      273                 05/22/2023             Lab Results      Component                Value               Date                      NA                       133 (L)             05/22/2023                K                        4.2                 05/22/2023                CO2                      26                  05/22/2023                GLUCOSE                  150 (H)             05/22/2023                BUN                      30 (H)              05/22/2023                CREATININE               2.81 (H)            05/22/2023                CALCIUM                  8.4 (L)             05/22/2023                GFRNONAA                 16 (L)              05/22/2023              Anesthesia Other Findings   Reproductive/Obstetrics                             Anesthesia Physical Anesthesia Plan  ASA: 3  Anesthesia Plan: General   Post-op Pain Management:    Induction: Intravenous  PONV Risk Score and Plan: 4 or greater and Ondansetron, Dexamethasone  and Treatment may vary due to age or medical condition  Airway Management Planned: Mask and Oral ETT  Additional Equipment: None  Intra-op Plan:   Post-operative Plan: Extubation in OR  Informed Consent: I have reviewed the patients History and Physical, chart, labs and discussed the procedure  including the risks, benefits and alternatives for the proposed anesthesia with the patient or authorized representative who has indicated his/her understanding and acceptance.     Dental advisory given  Plan Discussed with: CRNA  Anesthesia Plan Comments: (See PAT note written 05/30/2023 by Shonna Chock, PA-C. Evaluated by cardiologist Dr. Bing Matter 05/24/23. )       Anesthesia Quick Evaluation

## 2023-05-31 ENCOUNTER — Encounter (HOSPITAL_COMMUNITY): Payer: Self-pay | Admitting: Vascular Surgery

## 2023-05-31 ENCOUNTER — Other Ambulatory Visit: Payer: Self-pay

## 2023-05-31 ENCOUNTER — Encounter: Payer: 59 | Admitting: Student

## 2023-05-31 ENCOUNTER — Encounter (HOSPITAL_COMMUNITY)
Admission: RE | Disposition: A | Discharge status: Home / Self Care | Payer: Self-pay | Source: Ambulatory Visit | Attending: Vascular Surgery

## 2023-05-31 ENCOUNTER — Ambulatory Visit (HOSPITAL_COMMUNITY)
Admission: RE | Admit: 2023-05-31 | Discharge: 2023-05-31 | Disposition: A | Discharge status: Home / Self Care | Payer: 59 | Source: Ambulatory Visit | Attending: Vascular Surgery | Admitting: Vascular Surgery

## 2023-05-31 ENCOUNTER — Ambulatory Visit (HOSPITAL_BASED_OUTPATIENT_CLINIC_OR_DEPARTMENT_OTHER): Payer: 59 | Admitting: Vascular Surgery

## 2023-05-31 ENCOUNTER — Ambulatory Visit (HOSPITAL_COMMUNITY): Payer: Self-pay | Admitting: Vascular Surgery

## 2023-05-31 DIAGNOSIS — I5041 Acute combined systolic (congestive) and diastolic (congestive) heart failure: Secondary | ICD-10-CM | POA: Diagnosis not present

## 2023-05-31 DIAGNOSIS — E039 Hypothyroidism, unspecified: Secondary | ICD-10-CM | POA: Insufficient documentation

## 2023-05-31 DIAGNOSIS — N185 Chronic kidney disease, stage 5: Secondary | ICD-10-CM

## 2023-05-31 DIAGNOSIS — I132 Hypertensive heart and chronic kidney disease with heart failure and with stage 5 chronic kidney disease, or end stage renal disease: Secondary | ICD-10-CM | POA: Diagnosis not present

## 2023-05-31 DIAGNOSIS — N186 End stage renal disease: Secondary | ICD-10-CM | POA: Insufficient documentation

## 2023-05-31 DIAGNOSIS — E1122 Type 2 diabetes mellitus with diabetic chronic kidney disease: Secondary | ICD-10-CM | POA: Diagnosis not present

## 2023-05-31 DIAGNOSIS — Z992 Dependence on renal dialysis: Secondary | ICD-10-CM

## 2023-05-31 DIAGNOSIS — I5032 Chronic diastolic (congestive) heart failure: Secondary | ICD-10-CM | POA: Insufficient documentation

## 2023-05-31 DIAGNOSIS — Z794 Long term (current) use of insulin: Secondary | ICD-10-CM | POA: Diagnosis not present

## 2023-05-31 DIAGNOSIS — Z4902 Encounter for fitting and adjustment of peritoneal dialysis catheter: Secondary | ICD-10-CM

## 2023-05-31 HISTORY — DX: Other specified postprocedural states: Z98.890

## 2023-05-31 HISTORY — DX: Polyneuropathy, unspecified: G62.9

## 2023-05-31 HISTORY — DX: Nausea with vomiting, unspecified: R11.2

## 2023-05-31 HISTORY — PX: CAPD REMOVAL: SHX5234

## 2023-05-31 HISTORY — DX: End stage renal disease: N18.6

## 2023-05-31 HISTORY — DX: Dependence on renal dialysis: Z99.2

## 2023-05-31 HISTORY — DX: Bilateral primary osteoarthritis of hip: M16.0

## 2023-05-31 HISTORY — DX: Other amnesia: R41.3

## 2023-05-31 HISTORY — DX: Unspecified atrial fibrillation: I48.91

## 2023-05-31 LAB — POCT I-STAT, CHEM 8
BUN: 17 mg/dL (ref 8–23)
Calcium, Ion: 1.05 mmol/L — ABNORMAL LOW (ref 1.15–1.40)
Chloride: 99 mmol/L (ref 98–111)
Creatinine, Ser: 2.6 mg/dL — ABNORMAL HIGH (ref 0.44–1.00)
Glucose, Bld: 71 mg/dL (ref 70–99)
HCT: 35 % — ABNORMAL LOW (ref 36.0–46.0)
Hemoglobin: 11.9 g/dL — ABNORMAL LOW (ref 12.0–15.0)
Potassium: 3.7 mmol/L (ref 3.5–5.1)
Sodium: 138 mmol/L (ref 135–145)
TCO2: 31 mmol/L (ref 22–32)

## 2023-05-31 LAB — GLUCOSE, CAPILLARY
Glucose-Capillary: 77 mg/dL (ref 70–99)
Glucose-Capillary: 80 mg/dL (ref 70–99)
Glucose-Capillary: 130 mg/dL — ABNORMAL HIGH (ref 70–99)

## 2023-05-31 SURGERY — LAPAROSCOPIC REMOVAL CONTINUOUS AMBULATORY PERITONEAL DIALYSIS  (CAPD) CATHETER
Anesthesia: General | Site: Abdomen

## 2023-05-31 MED ORDER — AMISULPRIDE (ANTIEMETIC) 5 MG/2ML IV SOLN: 10.0000 mg | Freq: Once | INTRAVENOUS | Status: DC | PRN

## 2023-05-31 MED ORDER — ORAL CARE MOUTH RINSE
15.0000 mL | Freq: Once | OROMUCOSAL | Status: AC
  Administered 2023-05-31: 15 mL via OROMUCOSAL

## 2023-05-31 MED ORDER — ROCURONIUM BROMIDE 10 MG/ML (PF) SYRINGE
PREFILLED_SYRINGE | INTRAVENOUS | Status: AC
  Filled 2023-05-31: qty 10

## 2023-05-31 MED ORDER — CHLORHEXIDINE GLUCONATE 4 % EX SOLN: 60.0000 mL | Freq: Once | CUTANEOUS | Status: DC

## 2023-05-31 MED ORDER — LIDOCAINE 2% (20 MG/ML) 5 ML SYRINGE
INTRAMUSCULAR | Status: AC
  Filled 2023-05-31: qty 5

## 2023-05-31 MED ORDER — FENTANYL CITRATE (PF) 250 MCG/5ML IJ SOLN
INTRAMUSCULAR | Status: DC | PRN
  Administered 2023-05-31: 50 ug via INTRAVENOUS

## 2023-05-31 MED ORDER — DEXAMETHASONE SODIUM PHOSPHATE 10 MG/ML IJ SOLN
INTRAMUSCULAR | Status: DC | PRN
  Administered 2023-05-31: 5 mg via INTRAVENOUS

## 2023-05-31 MED ORDER — PROPOFOL 10 MG/ML IV BOLUS
INTRAVENOUS | Status: DC | PRN
Start: 2023-05-31 — End: 2023-05-31
  Administered 2023-05-31: 120 mg via INTRAVENOUS

## 2023-05-31 MED ORDER — OXYCODONE-ACETAMINOPHEN 5-325 MG PO TABS
1.0000 | ORAL_TABLET | Freq: Four times a day (QID) | ORAL | 0 refills | Status: DC | PRN
Start: 2023-05-31 — End: 2023-10-16

## 2023-05-31 MED ORDER — CHLORHEXIDINE GLUCONATE 0.12 % MT SOLN: 15.0000 mL | Freq: Once | OROMUCOSAL | Status: AC

## 2023-05-31 MED ORDER — SODIUM CHLORIDE 0.9 % IV SOLN: INTRAVENOUS | Status: DC

## 2023-05-31 MED ORDER — DEXAMETHASONE SODIUM PHOSPHATE 10 MG/ML IJ SOLN
INTRAMUSCULAR | Status: AC
  Filled 2023-05-31: qty 1

## 2023-05-31 MED ORDER — VANCOMYCIN HCL IN DEXTROSE 1-5 GM/200ML-% IV SOLN
1000.0000 mg | INTRAVENOUS | Status: AC
  Administered 2023-05-31: 1000 mg via INTRAVENOUS
  Filled 2023-05-31: qty 200

## 2023-05-31 MED ORDER — FENTANYL CITRATE (PF) 100 MCG/2ML IJ SOLN: 25.0000 ug | INTRAMUSCULAR | Status: DC | PRN

## 2023-05-31 MED ORDER — 0.9 % SODIUM CHLORIDE (POUR BTL) OPTIME
TOPICAL | Status: DC | PRN
  Administered 2023-05-31: 1000 mL

## 2023-05-31 MED ORDER — FENTANYL CITRATE (PF) 250 MCG/5ML IJ SOLN
INTRAMUSCULAR | Status: AC
  Filled 2023-05-31: qty 5

## 2023-05-31 MED ORDER — PROPOFOL 10 MG/ML IV BOLUS
INTRAVENOUS | Status: AC
  Filled 2023-05-31: qty 20

## 2023-05-31 MED ORDER — ONDANSETRON HCL 4 MG/2ML IJ SOLN
INTRAMUSCULAR | Status: DC | PRN
  Administered 2023-05-31: 4 mg via INTRAVENOUS

## 2023-05-31 MED ORDER — LIDOCAINE 2% (20 MG/ML) 5 ML SYRINGE
INTRAMUSCULAR | Status: DC | PRN
  Administered 2023-05-31: 40 mg via INTRAVENOUS

## 2023-05-31 MED ORDER — ONDANSETRON HCL 4 MG/2ML IJ SOLN
INTRAMUSCULAR | Status: AC
  Filled 2023-05-31: qty 2

## 2023-05-31 SURGICAL SUPPLY — 63 items
ADAPTER TITANIUM MEDIONICS (MISCELLANEOUS) ×1 IMPLANT
ADH SKN CLS APL DERMABOND .7 (GAUZE/BANDAGES/DRESSINGS) ×1
ADPR DLYS CATH STRL LF DISP (MISCELLANEOUS)
APL PRP STRL LF DISP 70% ISPRP (MISCELLANEOUS) ×1
APL SKNCLS STERI-STRIP NONHPOA (GAUZE/BANDAGES/DRESSINGS) ×1
APPLIER CLIP 5 13 M/L LIGAMAX5 (MISCELLANEOUS)
APR CLP MED LRG 5 ANG JAW (MISCELLANEOUS)
BAG DECANTER FOR FLEXI CONT (MISCELLANEOUS) ×1 IMPLANT
BENZOIN TINCTURE PRP APPL 2/3 (GAUZE/BANDAGES/DRESSINGS) IMPLANT
BIOPATCH RED 1 DISK 7.0 (GAUZE/BANDAGES/DRESSINGS) ×1 IMPLANT
BLADE CLIPPER SURG (BLADE) IMPLANT
BLADE SURG 11 STRL SS (BLADE) ×1 IMPLANT
CATH EXTENDED DIALYSIS (CATHETERS) IMPLANT
CHLORAPREP W/TINT 26 (MISCELLANEOUS) ×1 IMPLANT
CLIP APPLIE 5 13 M/L LIGAMAX5 (MISCELLANEOUS) IMPLANT
CLSR STERI-STRIP ANTIMIC 1/2X4 (GAUZE/BANDAGES/DRESSINGS) IMPLANT
COVER PROBE W GEL 5X96 (DRAPES) IMPLANT
COVER SURGICAL LIGHT HANDLE (MISCELLANEOUS) ×1 IMPLANT
DERMABOND ADVANCED .7 DNX12 (GAUZE/BANDAGES/DRESSINGS) ×1 IMPLANT
DEVICE TROCAR PUNCTURE CLOSURE (ENDOMECHANICALS) ×1 IMPLANT
DRSG TEGADERM 4X4.75 (GAUZE/BANDAGES/DRESSINGS) ×3 IMPLANT
ELECT REM PT RETURN 9FT ADLT (ELECTROSURGICAL) ×1
ELECTRODE REM PT RTRN 9FT ADLT (ELECTROSURGICAL) ×1 IMPLANT
GAUZE SPONGE 4X4 12PLY STRL (GAUZE/BANDAGES/DRESSINGS) ×1 IMPLANT
GLOVE INDICATOR 6.5 STRL GRN (GLOVE) ×1 IMPLANT
GLOVE SURG UNDER LTX SZ7.5 (GLOVE) ×1 IMPLANT
GOWN STRL REUS W/ TWL LRG LVL3 (GOWN DISPOSABLE) ×2 IMPLANT
GOWN STRL REUS W/ TWL XL LVL3 (GOWN DISPOSABLE) ×1 IMPLANT
GOWN STRL REUS W/TWL LRG LVL3 (GOWN DISPOSABLE) ×2
GOWN STRL REUS W/TWL XL LVL3 (GOWN DISPOSABLE) ×1
GRASPER SUT TROCAR 14GX15 (MISCELLANEOUS) ×1 IMPLANT
IRRIG SUCT STRYKERFLOW 2 WTIP (MISCELLANEOUS)
IRRIGATION SUCT STRKRFLW 2 WTP (MISCELLANEOUS) IMPLANT
IV NS 1000ML (IV SOLUTION) ×1
IV NS 1000ML BAXH (IV SOLUTION) ×1 IMPLANT
KIT BASIN OR (CUSTOM PROCEDURE TRAY) ×1 IMPLANT
KIT TURNOVER KIT B (KITS) ×1 IMPLANT
NDL INSUFFLATION 14GA 120MM (NEEDLE) ×1 IMPLANT
NEEDLE INSUFFLATION 14GA 120MM (NEEDLE) IMPLANT
NS IRRIG 1000ML POUR BTL (IV SOLUTION) ×1 IMPLANT
PAD ARMBOARD 7.5X6 YLW CONV (MISCELLANEOUS) ×2 IMPLANT
PENCIL SMOKE EVACUATOR (MISCELLANEOUS) IMPLANT
POWDER SURGICEL 3.0 GRAM (HEMOSTASIS) IMPLANT
SCISSORS LAP 5X35 DISP (ENDOMECHANICALS) IMPLANT
SET CYSTO W/LG BORE CLAMP LF (SET/KITS/TRAYS/PACK) ×1 IMPLANT
SET EXT 12IN DIALYSIS STAY-SAF (MISCELLANEOUS) ×1 IMPLANT
SET TUBE SMOKE EVAC HIGH FLOW (TUBING) ×1 IMPLANT
SLEEVE Z-THREAD 5X100MM (TROCAR) ×2 IMPLANT
SPIKE FLUID TRANSFER (MISCELLANEOUS) ×1 IMPLANT
STYLET FALLER (MISCELLANEOUS) ×1 IMPLANT
STYLET FALLER MEDIONICS (MISCELLANEOUS) ×1 IMPLANT
SUT MNCRL AB 4-0 PS2 18 (SUTURE) ×1 IMPLANT
SUT PROLENE 0 SH 30 (SUTURE) ×2 IMPLANT
SUT SILK 0 TIES 10X30 (SUTURE) ×1 IMPLANT
SUT VICRYL 3 0 (SUTURE) IMPLANT
TOWEL GREEN STERILE (TOWEL DISPOSABLE) ×1 IMPLANT
TOWEL GREEN STERILE FF (TOWEL DISPOSABLE) ×1 IMPLANT
TRAY LAPAROSCOPIC MC (CUSTOM PROCEDURE TRAY) ×1 IMPLANT
TROCAR 11X100 Z THREAD (TROCAR) IMPLANT
TROCAR 5MMX150MM (TROCAR) ×1 IMPLANT
TROCAR XCEL NON-BLD 5MMX100MML (ENDOMECHANICALS) ×1 IMPLANT
TROCAR Z-THREAD OPTICAL 5X100M (TROCAR) ×1 IMPLANT
WATER STERILE IRR 1000ML POUR (IV SOLUTION) ×1 IMPLANT

## 2023-05-31 NOTE — Op Note (Signed)
DATE OF SERVICE: 05/31/2023  PATIENT:  Elizabeth Olsen  82 y.o. female  PRE-OPERATIVE DIAGNOSIS:  ESRD, no longer on peritoneal dialysis  POST-OPERATIVE DIAGNOSIS:  Same  PROCEDURE:   Removal of peritoneal dialysis catheter  SURGEON:  Surgeons and Role:    * [name], MD - Primary  ASSISTANT: Deniece Ree, RNFA  ANESTHESIA:   general  EBL: minimal  BLOOD ADMINISTERED:none  DRAINS: none   LOCAL MEDICATIONS USED:  NONE  SPECIMEN:  none  COUNTS: confirmed correct.  TOURNIQUET:  none  PATIENT DISPOSITION:  PACU - hemodynamically stable.   Delay start of Pharmacological VTE agent (>24hrs) due to surgical blood loss or risk of bleeding: no  INDICATION FOR PROCEDURE: Elizabeth Olsen is a 82 y.o. female with ESRD. She has transitioned to hemodialysis. She no longer needs her peritoneal dialysis catheter. After careful discussion of risks, benefits, and alternatives the patient was offered removal of PD catheter. The patient understood and wished to proceed.  OPERATIVE FINDINGS: successful removal of peritoneal dialysis catheter.   DESCRIPTION OF PROCEDURE: After identification of the patient in the pre-operative holding area, the patient was transferred to the operating room. The patient was positioned supine on the operating room table. Anesthesia was induced. The abdomen was prepped and draped in standard fashion. A surgical pause was performed confirming correct patient, procedure, and operative location.  The course of the dialysis catheter was mapped on the skin.  There was no swan-neck extension.  2 cuffs were identified.  An incision was made over the central most cuff in the midline epigastric abdomen.  The cuff was identified and freed from the subcutaneous tissue.  The intra-abdominal portion of the catheter was removed intact.  I then identified the cuff closer to the exit site in the left abdomen.  The cuff was released here and the catheter removed intact.  The fascial  defect in the midline incision was closed with a UR 6 Vicryl stitch.  The skin was reapproximated with 4-0 Monocryl in a subcuticular stitch.  Benzoin and Steri-Strips were applied.  The catheter exit site was left open to drain.  Upon completion of the case instrument and sharps counts were confirmed correct. The patient was transferred to the PACU in good condition. I was present for all portions of the procedure.  FOLLOW UP PLAN: Assuming a normal postoperative course, the patient can see [place] on an as needed basis.  Elizabeth Olsen. Elizabeth Antu, MD [facility] Phone Number: [phone] 05/31/2023 8:20 AM

## 2023-05-31 NOTE — Transfer of Care (Signed)
Immediate Anesthesia Transfer of Care Note  Patient: Elizabeth Olsen  Procedure(s) Performed: REMOVAL CONTINUOUS AMBULATORY PERITONEAL DIALYSIS  (CAPD) CATHETER (Abdomen)  Patient Location: PACU  Anesthesia Type:General  Level of Consciousness: sedated  Airway & Oxygen Therapy: Patient Spontanous Breathing  Post-op Assessment: Report given to RN and Post -op Vital signs reviewed and stable  Post vital signs: Reviewed and stable  Last Vitals:  Vitals Value Taken Time  BP 146/52 05/31/23 0830  Temp    Pulse 75 05/31/23 0832  Resp 19 05/31/23 0832  SpO2 98 % 05/31/23 0832  Vitals shown include unfiled device data.  Last Pain:  Vitals:   05/31/23 0556  TempSrc: Oral  PainSc: 0-No pain         Complications: No notable events documented.

## 2023-05-31 NOTE — Anesthesia Procedure Notes (Signed)
Procedure Name: LMA Insertion Date/Time: 05/31/2023 7:39 AM  Performed by: Gus Puma, CRNAPre-anesthesia Checklist: Patient identified, Emergency Drugs available, Suction available and Patient being monitored Patient Re-evaluated:Patient Re-evaluated prior to induction Oxygen Delivery Method: Circle System Utilized Preoxygenation: Pre-oxygenation with 100% oxygen Induction Type: IV induction Ventilation: Mask ventilation without difficulty LMA: LMA inserted LMA Size: 4.0 Number of attempts: 1 Airway Equipment and Method: Bite block Placement Confirmation: positive ETCO2 Tube secured with: Tape Dental Injury: Teeth and Oropharynx as per pre-operative assessment

## 2023-05-31 NOTE — Interval H&P Note (Signed)
History and Physical Interval Note:  05/31/2023 7:19 AM  Elizabeth Olsen  has presented today for surgery, with the diagnosis of Presence of peritoneal dialysis catheter.  The various methods of treatment have been discussed with the patient and family. After consideration of risks, benefits and other options for treatment, the patient has consented to  Procedure(s): LAPAROSCOPIC REMOVAL CONTINUOUS AMBULATORY PERITONEAL DIALYSIS  (CAPD) CATHETER (N/A) as a surgical intervention.  The patient's history has been reviewed, patient examined, no change in status, stable for surgery.  I have reviewed the patient's chart and labs.  Questions were answered to the patient's satisfaction.     [name]

## 2023-05-31 NOTE — Anesthesia Postprocedure Evaluation (Signed)
Anesthesia Post Note  Patient: Elizabeth Olsen  Procedure(s) Performed: REMOVAL CONTINUOUS AMBULATORY PERITONEAL DIALYSIS  (CAPD) CATHETER (Abdomen)     Patient location during evaluation: PACU Anesthesia Type: General Level of consciousness: awake and alert Pain management: pain level controlled Vital Signs Assessment: post-procedure vital signs reviewed and stable Respiratory status: spontaneous breathing, nonlabored ventilation, respiratory function stable and patient connected to nasal cannula oxygen Cardiovascular status: blood pressure returned to baseline and stable Postop Assessment: no apparent nausea or vomiting Anesthetic complications: no   No notable events documented.  Last Vitals:  Vitals:   05/31/23 0845 05/31/23 0900  BP: (!) 157/57 (!) 162/60  Pulse: 74 74  Resp: 14 20  Temp:  36.6 C  SpO2: 95% 97%    Last Pain:  Vitals:   05/31/23 0830  TempSrc:   PainSc: 0-No pain                 [name]

## 2023-06-01 ENCOUNTER — Encounter (HOSPITAL_COMMUNITY): Payer: Self-pay | Admitting: Vascular Surgery

## 2023-06-11 ENCOUNTER — Emergency Department (HOSPITAL_COMMUNITY): Payer: 59

## 2023-06-11 ENCOUNTER — Other Ambulatory Visit: Payer: Self-pay

## 2023-06-11 ENCOUNTER — Encounter (HOSPITAL_COMMUNITY): Payer: Self-pay

## 2023-06-11 ENCOUNTER — Inpatient Hospital Stay (HOSPITAL_COMMUNITY)
Admission: EM | Admit: 2023-06-11 | Discharge: 2023-06-14 | DRG: 291 | Disposition: A | Discharge status: Home Health | Payer: 59 | Attending: Infectious Diseases | Admitting: Infectious Diseases

## 2023-06-11 DIAGNOSIS — I5043 Acute on chronic combined systolic (congestive) and diastolic (congestive) heart failure: Secondary | ICD-10-CM | POA: Diagnosis present

## 2023-06-11 DIAGNOSIS — E1022 Type 1 diabetes mellitus with diabetic chronic kidney disease: Secondary | ICD-10-CM | POA: Diagnosis present

## 2023-06-11 DIAGNOSIS — J918 Pleural effusion in other conditions classified elsewhere: Secondary | ICD-10-CM | POA: Diagnosis present

## 2023-06-11 DIAGNOSIS — K5909 Other constipation: Secondary | ICD-10-CM | POA: Diagnosis present

## 2023-06-11 DIAGNOSIS — Z853 Personal history of malignant neoplasm of breast: Secondary | ICD-10-CM

## 2023-06-11 DIAGNOSIS — N2581 Secondary hyperparathyroidism of renal origin: Secondary | ICD-10-CM | POA: Diagnosis present

## 2023-06-11 DIAGNOSIS — Z9049 Acquired absence of other specified parts of digestive tract: Secondary | ICD-10-CM

## 2023-06-11 DIAGNOSIS — Z882 Allergy status to sulfonamides status: Secondary | ICD-10-CM

## 2023-06-11 DIAGNOSIS — Z833 Family history of diabetes mellitus: Secondary | ICD-10-CM

## 2023-06-11 DIAGNOSIS — N61 Mastitis without abscess: Secondary | ICD-10-CM | POA: Diagnosis present

## 2023-06-11 DIAGNOSIS — K219 Gastro-esophageal reflux disease without esophagitis: Secondary | ICD-10-CM | POA: Diagnosis present

## 2023-06-11 DIAGNOSIS — R110 Nausea: Secondary | ICD-10-CM | POA: Diagnosis not present

## 2023-06-11 DIAGNOSIS — Z881 Allergy status to other antibiotic agents status: Secondary | ICD-10-CM

## 2023-06-11 DIAGNOSIS — I959 Hypotension, unspecified: Secondary | ICD-10-CM | POA: Diagnosis not present

## 2023-06-11 DIAGNOSIS — Z800 Family history of malignant neoplasm of digestive organs: Secondary | ICD-10-CM

## 2023-06-11 DIAGNOSIS — Z8616 Personal history of COVID-19: Secondary | ICD-10-CM

## 2023-06-11 DIAGNOSIS — N186 End stage renal disease: Secondary | ICD-10-CM | POA: Diagnosis present

## 2023-06-11 DIAGNOSIS — Z9842 Cataract extraction status, left eye: Secondary | ICD-10-CM

## 2023-06-11 DIAGNOSIS — E1122 Type 2 diabetes mellitus with diabetic chronic kidney disease: Secondary | ICD-10-CM

## 2023-06-11 DIAGNOSIS — E1042 Type 1 diabetes mellitus with diabetic polyneuropathy: Secondary | ICD-10-CM | POA: Diagnosis present

## 2023-06-11 DIAGNOSIS — Z794 Long term (current) use of insulin: Secondary | ICD-10-CM

## 2023-06-11 DIAGNOSIS — Z79890 Hormone replacement therapy (postmenopausal): Secondary | ICD-10-CM | POA: Diagnosis not present

## 2023-06-11 DIAGNOSIS — Z888 Allergy status to other drugs, medicaments and biological substances status: Secondary | ICD-10-CM | POA: Diagnosis not present

## 2023-06-11 DIAGNOSIS — D631 Anemia in chronic kidney disease: Secondary | ICD-10-CM | POA: Diagnosis present

## 2023-06-11 DIAGNOSIS — Z79899 Other long term (current) drug therapy: Secondary | ICD-10-CM

## 2023-06-11 DIAGNOSIS — Z992 Dependence on renal dialysis: Secondary | ICD-10-CM

## 2023-06-11 DIAGNOSIS — E785 Hyperlipidemia, unspecified: Secondary | ICD-10-CM | POA: Diagnosis present

## 2023-06-11 DIAGNOSIS — E039 Hypothyroidism, unspecified: Secondary | ICD-10-CM | POA: Diagnosis present

## 2023-06-11 DIAGNOSIS — M25552 Pain in left hip: Secondary | ICD-10-CM | POA: Diagnosis present

## 2023-06-11 DIAGNOSIS — Z90710 Acquired absence of both cervix and uterus: Secondary | ICD-10-CM

## 2023-06-11 DIAGNOSIS — J90 Pleural effusion, not elsewhere classified: Secondary | ICD-10-CM | POA: Diagnosis not present

## 2023-06-11 DIAGNOSIS — Z8249 Family history of ischemic heart disease and other diseases of the circulatory system: Secondary | ICD-10-CM

## 2023-06-11 DIAGNOSIS — I480 Paroxysmal atrial fibrillation: Secondary | ICD-10-CM | POA: Diagnosis present

## 2023-06-11 DIAGNOSIS — E8770 Fluid overload, unspecified: Secondary | ICD-10-CM | POA: Diagnosis not present

## 2023-06-11 DIAGNOSIS — I132 Hypertensive heart and chronic kidney disease with heart failure and with stage 5 chronic kidney disease, or end stage renal disease: Secondary | ICD-10-CM | POA: Diagnosis present

## 2023-06-11 DIAGNOSIS — Z7901 Long term (current) use of anticoagulants: Secondary | ICD-10-CM

## 2023-06-11 DIAGNOSIS — Z86010 Personal history of colonic polyps: Secondary | ICD-10-CM

## 2023-06-11 DIAGNOSIS — I10 Essential (primary) hypertension: Secondary | ICD-10-CM

## 2023-06-11 DIAGNOSIS — I509 Heart failure, unspecified: Secondary | ICD-10-CM | POA: Diagnosis not present

## 2023-06-11 DIAGNOSIS — D72829 Elevated white blood cell count, unspecified: Secondary | ICD-10-CM | POA: Diagnosis present

## 2023-06-11 DIAGNOSIS — Z515 Encounter for palliative care: Secondary | ICD-10-CM

## 2023-06-11 DIAGNOSIS — K5900 Constipation, unspecified: Secondary | ICD-10-CM | POA: Insufficient documentation

## 2023-06-11 DIAGNOSIS — R188 Other ascites: Secondary | ICD-10-CM | POA: Diagnosis present

## 2023-06-11 DIAGNOSIS — Z7189 Other specified counseling: Secondary | ICD-10-CM | POA: Diagnosis not present

## 2023-06-11 DIAGNOSIS — Z9012 Acquired absence of left breast and nipple: Secondary | ICD-10-CM

## 2023-06-11 DIAGNOSIS — Z9841 Cataract extraction status, right eye: Secondary | ICD-10-CM

## 2023-06-11 DIAGNOSIS — Z880 Allergy status to penicillin: Secondary | ICD-10-CM

## 2023-06-11 HISTORY — DX: Type 2 diabetes mellitus with diabetic chronic kidney disease: E11.22

## 2023-06-11 LAB — CBC WITH DIFFERENTIAL/PLATELET
Abs Immature Granulocytes: 0.05 10*3/uL (ref 0.00–0.07)
Basophils Absolute: 0 10*3/uL (ref 0.0–0.1)
Basophils Relative: 0 %
Eosinophils Absolute: 0 10*3/uL (ref 0.0–0.5)
Eosinophils Relative: 0 %
HCT: 39.1 % (ref 36.0–46.0)
Hemoglobin: 11.7 g/dL — ABNORMAL LOW (ref 12.0–15.0)
Immature Granulocytes: 1 %
Lymphocytes Relative: 4 %
Lymphs Abs: 0.5 10*3/uL — ABNORMAL LOW (ref 0.7–4.0)
MCH: 29.5 pg (ref 26.0–34.0)
MCHC: 29.9 g/dL — ABNORMAL LOW (ref 30.0–36.0)
MCV: 98.5 fL (ref 80.0–100.0)
Monocytes Absolute: 1 10*3/uL (ref 0.1–1.0)
Monocytes Relative: 9 %
Neutro Abs: 9.5 10*3/uL — ABNORMAL HIGH (ref 1.7–7.7)
Neutrophils Relative %: 86 %
Platelets: 310 10*3/uL (ref 150–400)
RBC: 3.97 MIL/uL (ref 3.87–5.11)
RDW: 16.1 % — ABNORMAL HIGH (ref 11.5–15.5)
WBC: 11.1 10*3/uL — ABNORMAL HIGH (ref 4.0–10.5)
nRBC: 0 % (ref 0.0–0.2)

## 2023-06-11 LAB — COMPREHENSIVE METABOLIC PANEL
ALT: 38 U/L (ref 0–44)
AST: 66 U/L — ABNORMAL HIGH (ref 15–41)
Albumin: 3.4 g/dL — ABNORMAL LOW (ref 3.5–5.0)
Alkaline Phosphatase: 112 U/L (ref 38–126)
Anion gap: 11 (ref 5–15)
BUN: 77 mg/dL — ABNORMAL HIGH (ref 8–23)
CO2: 27 mmol/L (ref 22–32)
Calcium: 9.2 mg/dL (ref 8.9–10.3)
Chloride: 95 mmol/L — ABNORMAL LOW (ref 98–111)
Creatinine, Ser: 5.2 mg/dL — ABNORMAL HIGH (ref 0.44–1.00)
GFR, Estimated: 8 mL/min — ABNORMAL LOW (ref >60)
Glucose, Bld: 174 mg/dL — ABNORMAL HIGH (ref 70–99)
Potassium: 5.3 mmol/L — ABNORMAL HIGH (ref 3.5–5.1)
Sodium: 133 mmol/L — ABNORMAL LOW (ref 135–145)
Total Bilirubin: 1 mg/dL (ref 0.3–1.2)
Total Protein: 6.7 g/dL (ref 6.5–8.1)

## 2023-06-11 LAB — MAGNESIUM: Magnesium: 2.2 mg/dL (ref 1.7–2.4)

## 2023-06-11 LAB — BRAIN NATRIURETIC PEPTIDE: B Natriuretic Peptide: 1086.3 pg/mL — ABNORMAL HIGH (ref 0.0–100.0)

## 2023-06-11 MED ORDER — CLONIDINE HCL 0.1 MG PO TABS: 0.1000 mg | ORAL_TABLET | Freq: Every day | ORAL | Status: DC | PRN

## 2023-06-11 MED ORDER — LEVOTHYROXINE SODIUM 88 MCG PO TABS
88.0000 ug | ORAL_TABLET | Freq: Every day | ORAL | Status: DC
  Administered 2023-06-12 – 2023-06-14 (×3): 88 ug via ORAL
  Filled 2023-06-11 (×4): qty 1

## 2023-06-11 MED ORDER — HYDROCODONE-ACETAMINOPHEN 5-325 MG PO TABS
1.0000 | ORAL_TABLET | Freq: Once | ORAL | Status: AC
  Administered 2023-06-11: 1 via ORAL
  Filled 2023-06-11: qty 1

## 2023-06-11 MED ORDER — SENNA 8.6 MG PO TABS
2.0000 | ORAL_TABLET | Freq: Every day | ORAL | Status: DC
  Administered 2023-06-12 – 2023-06-14 (×2): 17.2 mg via ORAL
  Filled 2023-06-11 (×3): qty 2

## 2023-06-11 MED ORDER — ONDANSETRON 4 MG PO TBDP
8.0000 mg | ORAL_TABLET | Freq: Once | ORAL | Status: AC
  Administered 2023-06-11: 8 mg via ORAL
  Filled 2023-06-11: qty 2

## 2023-06-11 MED ORDER — SODIUM ZIRCONIUM CYCLOSILICATE 10 G PO PACK
10.0000 g | PACK | Freq: Once | ORAL | Status: DC
  Filled 2023-06-11: qty 1

## 2023-06-11 MED ORDER — FENTANYL CITRATE PF 50 MCG/ML IJ SOSY
12.5000 ug | PREFILLED_SYRINGE | Freq: Once | INTRAMUSCULAR | Status: AC
  Administered 2023-06-11: 12.5 ug via INTRAVENOUS
  Filled 2023-06-11: qty 1

## 2023-06-11 MED ORDER — CHLORHEXIDINE GLUCONATE CLOTH 2 % EX PADS
6.0000 | MEDICATED_PAD | Freq: Every day | CUTANEOUS | Status: DC
  Administered 2023-06-13 – 2023-06-14 (×2): 6 via TOPICAL

## 2023-06-11 MED ORDER — ONDANSETRON HCL 4 MG/2ML IJ SOLN
4.0000 mg | Freq: Once | INTRAMUSCULAR | Status: DC
  Filled 2023-06-11: qty 2

## 2023-06-11 MED ORDER — APIXABAN 2.5 MG PO TABS
2.5000 mg | ORAL_TABLET | Freq: Two times a day (BID) | ORAL | Status: DC
  Administered 2023-06-12 – 2023-06-14 (×4): 2.5 mg via ORAL
  Filled 2023-06-11 (×5): qty 1

## 2023-06-11 MED ORDER — ACETAMINOPHEN 500 MG PO TABS
1000.0000 mg | ORAL_TABLET | Freq: Four times a day (QID) | ORAL | Status: DC | PRN
  Administered 2023-06-13: 1000 mg via ORAL
  Filled 2023-06-11: qty 2

## 2023-06-11 MED ORDER — METOPROLOL TARTRATE 12.5 MG HALF TABLET: 12.5000 mg | ORAL_TABLET | Freq: Every day | ORAL | Status: DC | PRN

## 2023-06-11 NOTE — Hospital Course (Signed)
Pain in left hip and stomach. Since Friday, but has been a recurrent cycle when she is in an acute volume overloaded state. Daughter endorses less than 32 ounces of fluid a day, mouth is dry, eyes are always dry. No cough, post nasal drip, inside an outside. No fevers. No headaches, chest pain. Feels like it's in the hip and radiates down the leg. Toes get stiff and tight, hands get stuck together.   Where does she get fluid overloaded, PD catheter removed on 8/1. Never been able to use PD cath because her cognitive function wasn't there.  Extremely constiapted   M<eds:  - Levo  Eliquis 2.5  Clonidine PRN  Metoprolol 25mg  total if HR >100 Insulin pump, decom, and omni pod 100U every three days --> when was the last time.    PMH:  Type 1 Diabetic 2/2 Experimental study group for breast cancer 25 years ago.  HTN CHF EF 45-50% in 2018 Paroysmal Atrial Fibrillation  Pulmonary Hypertension  CAD s/p stenting Hypothyroidism  ESRD MWF  Surgical history  Gallbladder removal  Mastectomy    Social History:  Recently moved from [place] because there are more family in [place]   Chest compression    8/13: - States she is feeling well, denies shortness of breath, needs to follow up with her PCP. Last time any

## 2023-06-11 NOTE — ED Notes (Signed)
Pt to ct, well appearing.

## 2023-06-11 NOTE — H&P (Signed)
Date: 06/11/2023               Patient Name:  Elizabeth Olsen MRN: [id number]  DOB: Jan 10, 1941 Age / Sex: 82 y.o., female   PCP: [facility]         Medical Service: Internal Medicine Teaching Service         Attending Physician: Dr. Ninetta Lights, Lacretia Leigh, MD      First Contact: Dr. Laretta Bolster, MD Pager [phone]    Second Contact: Dr. Olegario Messier, MD Pager [phone]         After Hours (After 5p/  First Contact Pager: [phone]  weekends / holidays): Second Contact Pager: [phone]   SUBJECTIVE   Chief Complaint: Abdominal/Leg Pain  History of Present Illness:   Elizabeth Olsen is an 81 year old female with past medical history of ESRD on HD MWF, Diastolic Heart Failure with EF of 50-55% who presents with abdominal pain. History is taken from daughter who is sitting bedside. Pt has recently moved to [place] from [place] at the beginning of July. Since then, she has had these recurrent cycles of her being volume overloaded which has led to her abdominal pain and leg pain. Daughter does endorse her being compliant with her fluid restriction. Her last dialysis session was Friday, and was supposed to go today however due to her pain she was brought here. Her leg pain starts in her hip and radiates down to her leg.  She denies any cough, fevers, headaches or chest pain. She also had her PD catheter removed on 8/1.  She was recently admitted on the IMTS and discharged on 05/18/23 for acute dyspnea secondary to R Pleural Effusion.   Meds:  Levothyroxine Eliquis 2.5 mg BID Clonidine .1mg  PRN when systolic >170 Metoprolol 25mg  total if HR >100 Omni Pod for Insulin administration   Past Medical History ESRD HFpEF Paroxysmal Atrial Fibrillation T1DM  Social:  Lives With:Daughter  Occupation: Previously was a Nurse, learning disability, now retired Support: Daughter and family in the area Level of Function: Normally independent in all ADLs/IADLs when not acutely ill PCP:  Theatre stage manager Substances: Denies cigarrette use, alcohol use, illicit drug use  Family History:  Brother with MI  HTN, HF With father   Allergies: Allergies as of 06/11/2023 - Review Complete 06/11/2023  Allergen Reaction Noted   Erythromycin Shortness Of Breath and Swelling 04/15/2012   Penicillins Rash 04/15/2012   Doxylamine-dm Rash 05/24/2023   Nyquil [pseudoeph-doxylamine-dm-apap] Rash 04/15/2012   Sulfa antibiotics Rash 04/15/2012    Review of Systems: A complete ROS was negative except as per HPI.   OBJECTIVE:   Physical Exam: Blood pressure (!) 184/73, pulse 74, temperature 98.1 F (36.7 C), temperature source Oral, resp. rate 17, SpO2 91%.  Constitutional: Elderly female, in mild distress with pain in hip HENT: normocephalic atraumatic, mucous membranes moist Eyes: conjunctiva non-erythematous Neck: supple Cardiovascular: regular rate and rhythm, no m/r/g Pulmonary/Chest: normal work of breathing on room air, lungs clear to auscultation bilaterally Abdominal: soft, non-tender, non-distended MSK: normal bulk and tone, left hip tender to palpation Neurological: alert & oriented x 3, 5/5 strength in bilateral upper and lower extremities, normal gait Skin: warm and dry, RUE fistula with palpable thrill Psych: normal mood and affect  Labs: CBC    Component Value Date/Time   WBC 11.1 (H) 06/11/2023 1018   RBC 3.97 06/11/2023 1018   HGB 11.7 (L) 06/11/2023 1018   HGB 10.8 (L) 07/07/2020 0938   HGB 10.9 (  L) 03/17/2020 1134   HGB 12.3 02/14/2007 0909   HCT 39.1 06/11/2023 1018   HCT 34.3 03/17/2020 1134   HCT 35.6 02/14/2007 0909   PLT 310 06/11/2023 1018   PLT 266 07/07/2020 0938   PLT 505 (H) 03/17/2020 1134   MCV 98.5 06/11/2023 1018   MCV 89 03/17/2020 1134   MCV 87.5 02/14/2007 0909   MCH 29.5 06/11/2023 1018   MCHC 29.9 (L) 06/11/2023 1018   RDW 16.1 (H) 06/11/2023 1018   RDW 15.0 03/17/2020 1134   RDW 13.1 02/14/2007 0909   LYMPHSABS 0.5 (L)  06/11/2023 1018   LYMPHSABS 2.4 02/14/2007 0909   MONOABS 1.0 06/11/2023 1018   MONOABS 0.7 02/14/2007 0909   EOSABS 0.0 06/11/2023 1018   EOSABS 0.2 02/14/2007 0909   BASOSABS 0.0 06/11/2023 1018   BASOSABS 0.0 02/14/2007 0909     CMP     Component Value Date/Time   NA 133 (L) 06/11/2023 1210   NA 130 (L) 05/21/2020 0952   K 5.3 (H) 06/11/2023 1210   CL 95 (L) 06/11/2023 1210   CO2 27 06/11/2023 1210   GLUCOSE 174 (H) 06/11/2023 1210   BUN 77 (H) 06/11/2023 1210   BUN 38 (H) 05/21/2020 0952   CREATININE 5.20 (H) 06/11/2023 1210   CREATININE 2.00 (H) 02/10/2020 0925   CALCIUM 9.2 06/11/2023 1210   PROT 6.7 06/11/2023 1210   PROT 6.7 03/17/2020 1134   ALBUMIN 3.4 (L) 06/11/2023 1210   ALBUMIN 3.7 03/17/2020 1134   AST 66 (H) 06/11/2023 1210   AST 24 02/10/2020 0925   ALT 38 06/11/2023 1210   ALT 16 02/10/2020 0925   ALKPHOS 112 06/11/2023 1210   BILITOT 1.0 06/11/2023 1210   BILITOT 0.2 03/17/2020 1134   BILITOT 0.4 02/10/2020 0925   GFRNONAA 8 (L) 06/11/2023 1210   GFRNONAA 23 (L) 02/10/2020 0925   GFRAA 22 (L) 05/21/2020 0952   GFRAA 27 (L) 02/10/2020 0925    Imaging:  CT Chest: Large right and trace pleural effusions with passive atelectasis CT Abdomen and Pelvis: Small amount of pelvic ascites with trace free fluid  Chest X-Ray: Cardiomegaly, vascular congestion, and small right pleural effusion   EKG: personally reviewed my interpretation is normal sinus rhythm and rate. Similar to prior EKG  ASSESSMENT & PLAN:   Assessment & Plan by Problem: Principal Problem:   Acute exacerbation of CHF (congestive heart failure) (HCC)   Elizabeth Olsen is a 82 y.o. person living with a history of ESRD, CHF, breast cancer who presented with left hip pain and admitted for multifactorial volume overloaded state on hospital day 0  #Acute HF Exacerbation  #ESRD on HD MWF #HTN #Large Pleural Effusion Pt presented in acute volume overloaded state exhibited by imaging and  some edema on her lower extremities, likely multifactorial in nature. Last echo performed on 05/16/23 showed an ejection fraction of 50-55% with grade III diastolic dysfunction. Also showed severely elevated pulmonary artery systolic pressure. Last cardiology appointment was on 05/24/23, where she was scheduled to have another echocardiogram done on 8/29 when she is medically stable. Etiology currently unclear. No exacerbating factors such as recent illnesses, blood pressure has been elevated however. GDMT limited by ESRD, and she is only on metoprolol PRN, as well as clonidine PRN. Fortunately, she is still saturating well on room air.   Plan:  - Volume management per dialysis and nephrology, appreciate recommendations  - May go to dialysis tonight or tomorrow AM depending on staffing -  Will continue clonidine PRN and metoprolol to get better control of BP  - Can consider thoracentesis if pt has increasing oxygen requirements - Continue clonidine and metoprolol, consider adding hydralazine if BP still uncontrolled.   #Leg Pain Per daughter, every time pt is in a hypervolemic state, she develops this left hip pain. She describes a procedure in which fluid was taken out of it, however unable to find documentation of it. Will continue to monitor.   Plan:  - Tylenol 1000mg  for pain control   #Constipation Pt has been extremely constipated per daughter, will start bowel regimen for her here with senna  Chronic Problems  Afib: Continuing eliquis 2.5mg  BID T1DM: On omni pod, can consider using SSI Hypothyroidism: Continuing levothyroixine Anemia: Hb stable at 11.7   Diet: Renal VTE: DOAC IVF: None,None Code: Full  Prior to Admission Living Arrangement: Home, living with daughter Anticipated Discharge Location: Home Barriers to Discharge: Medical management  Dispo: Admit patient to Inpatient with expected length of stay greater than 2 midnights.  SignedOlegario Messier,  MD Internal Medicine Resident PGY-2  06/11/2023, 4:22 PM

## 2023-06-11 NOTE — ED Provider Notes (Signed)
[place] EMERGENCY DEPARTMENT AT [facility] Provider Note   CSN: [id number] Arrival date & time: 06/11/23  0913     History  Chief Complaint  Patient presents with   Hip Pain    Elizabeth Olsen is a 82 y.o. female.  82 year old female with past medical history significant for ESRD on dialysis, A-fib, CHF presents for concern of left hip pain.  Daughter accompanies patient and is at bedside.  Left hip pain started yesterday.  She also is constipated.  She has some shortness of breath which is not unusual for her.  According the daughter she had left hip pain within the past couple months when she had a pleural effusion that required thoracentesis.  Pain improved after thoracentesis.  She was due to go to dialysis today around noon.  The history is provided by the patient. No language interpreter was used.       Home Medications Prior to Admission medications   Medication Sig Start Date End Date Taking? Authorizing Provider  apixaban (ELIQUIS) 2.5 MG TABS tablet Take 1 tablet (2.5 mg total) by mouth 2 (two) times daily. 05/06/23   Faith Rogue, DO  cloNIDine (CATAPRES) 0.1 MG tablet Take 1 tablet (0.1 mg total) by mouth daily as needed (FOR SYSTOLIC BLOOD PRESSURE GREATER THAN 170 AND OR DIASTOLIC FOR BLOOD PRESSURE GREATER 100). 05/18/23   Faith Rogue, DO  Continuous Blood Gluc Sensor (FREESTYLE LIBRE 14 DAY SENSOR) MISC 1 each by Other route See admin instructions. 10/22/19   [provider]  HUMALOG 100 UNIT/ML injection Inject 50-100 Units into the skin See admin instructions. Pump 02/12/20   [provider]  Insulin Disposable Pump (OMNIPOD DASH 5 PACK PODS) MISC SMARTSIG:SUB-Q Every 3 Days 05/11/20   [provider]  insulin glargine (LANTUS SOLOSTAR) 100 UNIT/ML Solostar Pen Inject 1-100 Units into the skin in the morning. Unknown Sliding scale 01/26/20   [provider]  Insulin Syringe-Needle U-100 (ULTICARE INSULIN SYRINGE) 30G X  1/2" 0.5 ML MISC Please use as previously directed Patient taking differently: 1 each by Other route See admin instructions. Please use as previously directed 05/18/23   Faith Rogue, DO  levothyroxine (SYNTHROID) 88 MCG tablet Take 1 tablet (88 mcg total) by mouth daily at 6 (six) AM. 05/07/23 06/06/23  Morene Crocker, MD  lidocaine-prilocaine (EMLA) cream Apply 1 Application topically once. 04/07/23   [provider]  metoprolol tartrate (LOPRESSOR) 25 MG tablet Take 12.5 mg by mouth daily as needed (IF HR more than 100 and syst BP more than 160 per daughter).    [provider]  nystatin (MYCOSTATIN/NYSTOP) powder Apply topically 2 (two) times daily. Patient taking differently: Apply 1 Application topically 2 (two) times daily. 05/18/23   Faith Rogue, DO  ONETOUCH VERIO test strip 1 each as needed for other (Glucose). 08/24/17   [provider]  oxyCODONE-acetaminophen (PERCOCET) 5-325 MG tablet Take 1 tablet by mouth every 6 (six) hours as needed for severe pain. 05/31/23 05/30/24  Baglia, Corrina, PA-C      Allergies    Erythromycin, Penicillins, Doxylamine-dm, Nyquil [pseudoeph-doxylamine-dm-apap], and Sulfa antibiotics    Review of Systems   Review of Systems  Constitutional:  Negative for fever.  Respiratory:  Positive for shortness of breath. Negative for cough.   Cardiovascular:  Negative for chest pain, palpitations and leg swelling.  Gastrointestinal:  Positive for abdominal pain and constipation. Negative for nausea and vomiting.  Musculoskeletal:  Positive for arthralgias.  Neurological:  Negative for  light-headedness.  All other systems reviewed and are negative.   Physical Exam Updated Vital Signs BP (!) 178/59   Pulse 65   Temp (!) 97.3 F (36.3 C) (Oral)   Resp 20   SpO2 96%  Physical Exam Vitals and nursing note reviewed.  Constitutional:      General: She is not in acute distress.    Appearance: Normal appearance. She is not  ill-appearing.  HENT:     Head: Normocephalic and atraumatic.     Nose: Nose normal.  Eyes:     General: No scleral icterus.    Extraocular Movements: Extraocular movements intact.     Conjunctiva/sclera: Conjunctivae normal.  Cardiovascular:     Rate and Rhythm: Normal rate.     Heart sounds: Normal heart sounds.  Pulmonary:     Effort: Pulmonary effort is normal. No respiratory distress.     Breath sounds: Normal breath sounds. No wheezing or rales.  Abdominal:     General: There is no distension.     Palpations: Abdomen is soft.     Tenderness: There is no abdominal tenderness. There is no guarding.  Musculoskeletal:        General: Normal range of motion.     Cervical back: Normal range of motion.     Right lower leg: No edema.     Left lower leg: No edema.  Skin:    General: Skin is warm and dry.  Neurological:     General: No focal deficit present.     Mental Status: She is alert. Mental status is at baseline.     ED Results / Procedures / Treatments   Labs (all labs ordered are listed, but only abnormal results are displayed) Labs Reviewed  CBC WITH DIFFERENTIAL/PLATELET - Abnormal; Notable for the following components:      Result Value   WBC 11.1 (*)    Hemoglobin 11.7 (*)    MCHC 29.9 (*)    RDW 16.1 (*)    Neutro Abs 9.5 (*)    Lymphs Abs 0.5 (*)    All other components within normal limits  BRAIN NATRIURETIC PEPTIDE - Abnormal; Notable for the following components:   B Natriuretic Peptide 1,086.3 (*)    All other components within normal limits  URINALYSIS, W/ REFLEX TO CULTURE (INFECTION SUSPECTED)  COMPREHENSIVE METABOLIC PANEL  MAGNESIUM    EKG None  Radiology DG Hip Unilat With Pelvis 2-3 Views Left  Result Date: 06/11/2023 CLINICAL DATA:  Fall with left hip pain and discomfort. EXAM: DG HIP (WITH OR WITHOUT PELVIS) 2-3V LEFT COMPARISON:  None Available. FINDINGS: Bones are diffusely demineralized. SI joints and symphysis pubis unremarkable.  No evidence for pubic ramus fracture. AP and frog-leg lateral views of the left hip show no evidence for femoral neck fracture IMPRESSION: 1. No evidence for left hip fracture. 2. Diffuse bone demineralization. Electronically Signed   By: Kennith Center M.D.   On: 06/11/2023 10:41   DG Chest Portable 1 View  Result Date: 06/11/2023 CLINICAL DATA:  Fall today with left hip pain EXAM: PORTABLE CHEST 1 VIEW COMPARISON:  05/22/2023 FINDINGS: Chronic cardiomegaly and congested appearance of pulmonary vessels with small right pleural effusion. Postoperative left breast and axilla. IMPRESSION: 1. Stable from 05/22/2023.  No acute finding. 2. Cardiomegaly, vascular congestion, and small right pleural effusion. Electronically Signed   By: Tiburcio Pea M.D.   On: 06/11/2023 10:40    Procedures Procedures    Medications Ordered in ED Medications  ondansetron (  ZOFRAN) injection 4 mg (has no administration in time range)  fentaNYL (SUBLIMAZE) injection 12.5 mcg (has no administration in time range)  ondansetron (ZOFRAN-ODT) disintegrating tablet 8 mg (8 mg Oral Given 06/11/23 1035)  HYDROcodone-acetaminophen (NORCO/VICODIN) 5-325 MG per tablet 1 tablet (1 tablet Oral Given 06/11/23 1113)    ED Course/ Medical Decision Making/ A&P                                 Medical Decision Making Amount and/or Complexity of Data Reviewed Labs: ordered. Radiology: ordered.  Risk Prescription drug management.   Medical Decision Making / ED Course   This patient presents to the ED for concern of left hip pain, dyspnea, this involves an extensive number of treatment options, and is a complaint that carries with it a high risk of complications and morbidity.  The differential diagnosis includes fracture, OA, volume overload, pneumonia  MDM: 82 year old female with past medical history as above presents today for concern of left hip pain, as well as shortness of breath for the past couple days.  She also has  some abdominal discomfort as well.  Unable to make it to her dialysis session due to the left hip pain.  Otherwise well-appearing and without acute distress.  Daughter is at bedside and provides most of the history.  Daughter states that patient had similar presentation about a month ago when she was admitted with large pleural effusion that required drainage.  She states after she had the thoracentesis last time she had improvement in her hip pain.  Her peritoneal dialysis catheter was removed last month.  The site is without infection.  Abdominal exam is relatively benign without guarding.  Workup reveals signs of volume overload.  Chest x-ray with some vascular congestion.  CT obtained of the chest without contrast which shows large right-sided pleural effusion and a trace left-sided effusion.  No obvious pneumonia or other acute cardiopulmonary concerns.  CBC with mild leukocytosis no significant left shift.  Mild anemia around patient's baseline.  CMP shows potassium 5.3.  No acute hyperkalemic changes.  Uptrend in creatinine from recent labs 11 days ago.  Creatinine currently 5.20.  BNP elevated at 1086.  Magnesium 2.2.  CT abdomen pelvis without contrast does not show any acute intra-abdominal finding.  Abdominal discomfort is likely distention from volume overload.  Discussed with nephrology.  They will evaluate patient.  Discussed with IMTS.  They will evaluate patient for admission.  Lab Tests: -I ordered, reviewed, and interpreted labs.   The pertinent results include:   Labs Reviewed  CBC WITH DIFFERENTIAL/PLATELET - Abnormal; Notable for the following components:      Result Value   WBC 11.1 (*)    Hemoglobin 11.7 (*)    MCHC 29.9 (*)    RDW 16.1 (*)    Neutro Abs 9.5 (*)    Lymphs Abs 0.5 (*)    All other components within normal limits  BRAIN NATRIURETIC PEPTIDE - Abnormal; Notable for the following components:   B Natriuretic Peptide 1,086.3 (*)    All other components within  normal limits  COMPREHENSIVE METABOLIC PANEL - Abnormal; Notable for the following components:   Sodium 133 (*)    Potassium 5.3 (*)    Chloride 95 (*)    Glucose, Bld 174 (*)    BUN 77 (*)    Creatinine, Ser 5.20 (*)    Albumin 3.4 (*)    AST  66 (*)    GFR, Estimated 8 (*)    All other components within normal limits  MAGNESIUM      EKG  EKG Interpretation Date/Time:    Ventricular Rate:    PR Interval:    QRS Duration:    QT Interval:    QTC Calculation:   R Axis:      Text Interpretation:           Imaging Studies ordered: I ordered imaging studies including chest x-ray, CT chest without contrast, CT abdomen pelvis without contrast, left hip x-ray I independently visualized and interpreted imaging. I agree with the radiologist interpretation   Medicines ordered and prescription drug management: Meds ordered this encounter  Medications   ondansetron (ZOFRAN) injection 4 mg   fentaNYL (SUBLIMAZE) injection 12.5 mcg   ondansetron (ZOFRAN-ODT) disintegrating tablet 8 mg   HYDROcodone-acetaminophen (NORCO/VICODIN) 5-325 MG per tablet 1 tablet    -I have reviewed the patients home medicines and have made adjustments as needed   Reevaluation: After the interventions noted above, I reevaluated the patient and found that they have :improved from a pain standpoint  Co morbidities that complicate the patient evaluation  Past Medical History:  Diagnosis Date   A-fib (HCC)    Acquired hypothyroidism 10/13/2017   Acute diastolic CHF (congestive heart failure) (HCC) 11/20/2019   Acute respiratory failure with hypoxia (HCC) 10/13/2017   Acute systolic heart failure (HCC) 10/13/2017   Anemia    Anemia associated with diabetes mellitus (HCC) 10/13/2017   Anemia of chronic disease 12/10/2017   Blood transfusion    Blood transfusion without reported diagnosis    with breast surgery   Breast cancer (HCC) 15 years ago   left    Cardiomyopathy (HCC) 07/23/2017    Ejection fraction 45-50% may; from September 2018   CHF (congestive heart failure) (HCC)    Chronic cholecystitis with calculus 01/22/2018   Colon polyp 03/2007   adenomatous   COVID-19 virus infection 11/20/2019   Diabetes mellitus    dx 1998.  was told prior to getting chemo that her bld sugar rose.  She thought it would go back   Dyspnea    d/t anemia   ESRD (end stage renal disease) on dialysis (HCC)    HD MWF   Essential hypertension 09/11/2018   GERD (gastroesophageal reflux disease)    Heart murmur    as child   Hernia, incisional    Hyperlipidemia    Hypertension    Hypothyroidism    Iron deficiency 07/23/2017   Near syncope 04/01/2018   Neuropathy    Osteoarthritis, hip, bilateral    PONV (postoperative nausea and vomiting)    Refractory anemia, unspecified (HCC) 12/10/2017   Short-term memory loss    per pt's daughter   Type 1 diabetes (HCC) 07/23/2017   Vertigo 06/24/2018   Vitamin D deficiency       Dispostion: Discussed with internal medicine teaching service.  They will evaluate patient for admission.  Final Clinical Impression(s) / ED Diagnoses Final diagnoses:  ESRD (end stage renal disease) (HCC)  Hypervolemia, unspecified hypervolemia type  Left hip pain    Rx / DC Orders ED Discharge Orders     None         Marita Kansas, PA-C 06/11/23 1413    Benjiman Core, MD 06/11/23 1530

## 2023-06-11 NOTE — H&P (Incomplete)
Internal Medicine Teaching Service Attending Note Date: 06/11/2023  Patient name: Elizabeth Olsen  Medical record number: [id number]  Date of birth: 1941-05-14   I have seen and evaluated Lesleigh Noe and discussed their care with the Residency Team.  82 yo F IDDM, ESRD (failed PD at home), PTCA (x3) 2021, Breast CA (1998), and constipation comes to ED with worsening L sided abd pain. She had episode similar to this prior when she required a thoracentesis to remove and effusion.  Today she also describes SOB and increasing BLE swelling.  She was scheduled for HD today but instead was brought to ED by her daughter.  Lives with daughter, wants CPR but not intubation.   Physical Exam: Blood pressure (!) 184/73, pulse 74, temperature 98.1 F (36.7 C), temperature source Oral, resp. rate 17, SpO2 91%. General appearance: alert, cooperative, and no distress Eyes: negative findings: pupils equal, round, reactive to light and accomodation Throat: lips, mucosa, and tongue normal; teeth and gums normal Neck: no adenopathy and no JVD Resp: rales bilaterally Cardio: regular rate and rhythm S1 murmur 3/6 GI: normal findings: bowel sounds normal and soft, non-tender Extremities: edema slightly worse on R than L Incision/Wound: abd wounds from her prev PD cath are clean. Non-tender. Her RUE AVF is clean, +thrill/bruit.   Lab results: Results for orders placed or performed during the hospital encounter of 06/11/23 (from the past 24 hour(s))  CBC with Differential/Platelet     Status: Abnormal   Collection Time: 06/11/23 10:18 AM  Result Value Ref Range   WBC 11.1 (H) 4.0 - 10.5 K/uL   RBC 3.97 3.87 - 5.11 MIL/uL   Hemoglobin 11.7 (L) 12.0 - 15.0 g/dL   HCT 84.6 96.2 - 95.2 %   MCV 98.5 80.0 - 100.0 fL   MCH 29.5 26.0 - 34.0 pg   MCHC 29.9 (L) 30.0 - 36.0 g/dL   RDW 84.1 (H) 32.4 - 40.1 %   Platelets 310 150 - 400 K/uL   nRBC 0.0 0.0 - 0.2 %   Neutrophils Relative % 86 %   Neutro Abs 9.5 (H)  1.7 - 7.7 K/uL   Lymphocytes Relative 4 %   Lymphs Abs 0.5 (L) 0.7 - 4.0 K/uL   Monocytes Relative 9 %   Monocytes Absolute 1.0 0.1 - 1.0 K/uL   Eosinophils Relative 0 %   Eosinophils Absolute 0.0 0.0 - 0.5 K/uL   Basophils Relative 0 %   Basophils Absolute 0.0 0.0 - 0.1 K/uL   Immature Granulocytes 1 %   Abs Immature Granulocytes 0.05 0.00 - 0.07 K/uL  Brain natriuretic peptide     Status: Abnormal   Collection Time: 06/11/23 10:18 AM  Result Value Ref Range   B Natriuretic Peptide 1,086.3 (H) 0.0 - 100.0 pg/mL  Comprehensive metabolic panel     Status: Abnormal   Collection Time: 06/11/23 12:10 PM  Result Value Ref Range   Sodium 133 (L) 135 - 145 mmol/L   Potassium 5.3 (H) 3.5 - 5.1 mmol/L   Chloride 95 (L) 98 - 111 mmol/L   CO2 27 22 - 32 mmol/L   Glucose, Bld 174 (H) 70 - 99 mg/dL   BUN 77 (H) 8 - 23 mg/dL   Creatinine, Ser 0.27 (H) 0.44 - 1.00 mg/dL   Calcium 9.2 8.9 - 25.3 mg/dL   Total Protein 6.7 6.5 - 8.1 g/dL   Albumin 3.4 (L) 3.5 - 5.0 g/dL   AST 66 (H) 15 - 41 U/L  ALT 38 0 - 44 U/L   Alkaline Phosphatase 112 38 - 126 U/L   Total Bilirubin 1.0 0.3 - 1.2 mg/dL   GFR, Estimated 8 (L) >60 mL/min   Anion gap 11 5 - 15  Magnesium     Status: None   Collection Time: 06/11/23 12:10 PM  Result Value Ref Range   Magnesium 2.2 1.7 - 2.4 mg/dL    Imaging results:  CT ABDOMEN PELVIS WO CONTRAST  Result Date: 06/11/2023 CLINICAL DATA:  Shortness of breath.  Acute abdominal pain EXAM: CT CHEST, ABDOMEN AND PELVIS WITHOUT CONTRAST TECHNIQUE: Multidetector CT imaging of the chest, abdomen and pelvis was performed following the standard protocol without IV contrast. RADIATION DOSE REDUCTION: This exam was performed according to the departmental dose-optimization program which includes automated exposure control, adjustment of the mA and/or kV according to patient size and/or use of iterative reconstruction technique. COMPARISON:  05/05/2023 CTA chest, prior CT abdomen  11/20/2017 FINDINGS: CT CHEST FINDINGS Cardiovascular: Atherosclerotic vascular calcification of the thoracic aorta, left main, left anterior descending, and right coronary artery. Mitral valve calcification. Mild cardiomegaly. Mediastinum/Nodes: Right paratracheal node borderline prominent at 1.0 cm in short axis on image 17 series 3. Prior left axillary node dissection and mastectomy. Lungs/Pleura: Large right and trace left pleural effusions with passive atelectasis. Cylindrical bronchiectasis anteriorly in the right upper lobe. Mild secondary pulmonary lobular interstitial accentuation at the lung apices, cannot exclude mild interstitial edema. There is mild atelectasis in the lingula. Musculoskeletal: Left mastectomy; edema in the right breast, significance uncertain, cannot exclude mastitis. Thoracic spondylosis. CT ABDOMEN PELVIS FINDINGS Hepatobiliary: Prior cholecystectomy. Suspected mild nonspecific periportal edema. Pancreas: Unremarkable Spleen: Unremarkable Adrenals/Urinary Tract: Small amount of gas in the urinary bladder, query recent catheterization. Mild left hydronephrosis and hydroureter, specific cause not identified. Adrenal glands normal. Stomach/Bowel: Appendix surgically absent. There is some scattered diverticula along the colon. No dilated bowel. Vascular/Lymphatic: Atherosclerosis is present, including aortoiliac atherosclerotic disease. No pathologic adenopathy observed. Reproductive: Uterus absent.  No adnexal mass identified. Other: Small amount of pelvic ascites with trace free fluid along the paracolic gutters and adjacent to the liver. Musculoskeletal: Prominent bilateral digit degenerative hip arthropathy. 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 IMPRESSION: 1. Large right and trace left pleural effusions with passive atelectasis. 2. Edema confluent inferiorly in the right breast, cannot exclude mastitis, correlate with any associated symptoms. Prior left mastectomy. 3. Mild  secondary pulmonary lobular interstitial accentuation at the lung apices, cannot exclude mild interstitial edema. 4. Mild cardiomegaly. 5. Atherosclerosis including left main, left anterior descending, and right coronary artery. 6. Mitral valve calcification. 7. Small amount of pelvic ascites with trace free fluid along the paracolic gutters and adjacent to the liver. 8. Mild left hydronephrosis and hydroureter, specific cause not identified. 9. Small amount of gas in the urinary bladder, query recent catheterization. Aortic Atherosclerosis (ICD10-I70.0). Electronically Signed   By: Gaylyn Rong M.D.   On: 06/11/2023 12:56   CT CHEST WO CONTRAST  Result Date: 06/11/2023 CLINICAL DATA:  Shortness of breath.  Acute abdominal pain EXAM: CT CHEST, ABDOMEN AND PELVIS WITHOUT CONTRAST TECHNIQUE: Multidetector CT imaging of the chest, abdomen and pelvis was performed following the standard protocol without IV contrast. RADIATION DOSE REDUCTION: This exam was performed according to the departmental dose-optimization program which includes automated exposure control, adjustment of the mA and/or kV according to patient size and/or use of iterative reconstruction technique. COMPARISON:  05/05/2023 CTA chest, prior CT abdomen 11/20/2017 FINDINGS: CT CHEST FINDINGS Cardiovascular: Atherosclerotic  vascular calcification of the thoracic aorta, left main, left anterior descending, and right coronary artery. Mitral valve calcification. Mild cardiomegaly. Mediastinum/Nodes: Right paratracheal node borderline prominent at 1.0 cm in short axis on image 17 series 3. Prior left axillary node dissection and mastectomy. Lungs/Pleura: Large right and trace left pleural effusions with passive atelectasis. Cylindrical bronchiectasis anteriorly in the right upper lobe. Mild secondary pulmonary lobular interstitial accentuation at the lung apices, cannot exclude mild interstitial edema. There is mild atelectasis in the lingula.  Musculoskeletal: Left mastectomy; edema in the right breast, significance uncertain, cannot exclude mastitis. Thoracic spondylosis. CT ABDOMEN PELVIS FINDINGS Hepatobiliary: Prior cholecystectomy. Suspected mild nonspecific periportal edema. Pancreas: Unremarkable Spleen: Unremarkable Adrenals/Urinary Tract: Small amount of gas in the urinary bladder, query recent catheterization. Mild left hydronephrosis and hydroureter, specific cause not identified. Adrenal glands normal. Stomach/Bowel: Appendix surgically absent. There is some scattered diverticula along the colon. No dilated bowel. Vascular/Lymphatic: Atherosclerosis is present, including aortoiliac atherosclerotic disease. No pathologic adenopathy observed. Reproductive: Uterus absent.  No adnexal mass identified. Other: Small amount of pelvic ascites with trace free fluid along the paracolic gutters and adjacent to the liver. Musculoskeletal: Prominent bilateral digit degenerative hip arthropathy. 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 IMPRESSION: 1. Large right and trace left pleural effusions with passive atelectasis. 2. Edema confluent inferiorly in the right breast, cannot exclude mastitis, correlate with any associated symptoms. Prior left mastectomy. 3. Mild secondary pulmonary lobular interstitial accentuation at the lung apices, cannot exclude mild interstitial edema. 4. Mild cardiomegaly. 5. Atherosclerosis including left main, left anterior descending, and right coronary artery. 6. Mitral valve calcification. 7. Small amount of pelvic ascites with trace free fluid along the paracolic gutters and adjacent to the liver. 8. Mild left hydronephrosis and hydroureter, specific cause not identified. 9. Small amount of gas in the urinary bladder, query recent catheterization. Aortic Atherosclerosis (ICD10-I70.0). Electronically Signed   By: Gaylyn Rong M.D.   On: 06/11/2023 12:56   DG Hip Unilat With Pelvis 2-3 Views Left  Result Date:  06/11/2023 CLINICAL DATA:  Fall with left hip pain and discomfort. EXAM: DG HIP (WITH OR WITHOUT PELVIS) 2-3V LEFT COMPARISON:  None Available. FINDINGS: Bones are diffusely demineralized. SI joints and symphysis pubis unremarkable. No evidence for pubic ramus fracture. AP and frog-leg lateral views of the left hip show no evidence for femoral neck fracture IMPRESSION: 1. No evidence for left hip fracture. 2. Diffuse bone demineralization. Electronically Signed   By: Kennith Center M.D.   On: 06/11/2023 10:41   DG Chest Portable 1 View  Result Date: 06/11/2023 CLINICAL DATA:  Fall today with left hip pain EXAM: PORTABLE CHEST 1 VIEW COMPARISON:  05/22/2023 FINDINGS: Chronic cardiomegaly and congested appearance of pulmonary vessels with small right pleural effusion. Postoperative left breast and axilla. IMPRESSION: 1. Stable from 05/22/2023.  No acute finding. 2. Cardiomegaly, vascular congestion, and small right pleural effusion. Electronically Signed   By: Tiburcio Pea M.D.   On: 06/11/2023 10:40    Assessment and Plan: I agree with the formulated Assessment and Plan with the following changes:  Abd pain ESRD Fluid overload  She does not have tenderness on exam and her wounds are clean ( I doubt this is related to her prev PD site). She also does not have rash (as a prodrome for zoster).  Will see how she feels after improving her bowel hygiene, HD today.  BNP is > 1000. Hopefully HD will help. Repeat her TTE as outpt.

## 2023-06-11 NOTE — ED Triage Notes (Signed)
Pt came in via POV d/t pain in Lt hip for a couple hrs before arrival to ED. A/Ox4, rates pain 10/10, denies any recent falls or injuries.

## 2023-06-11 NOTE — ED Notes (Signed)
Pt ambulated to restroom. 

## 2023-06-11 NOTE — Consult Note (Signed)
[facility] Renal Consultation Note    Indication for Consultation:  Management of ESRD/hemodialysis; anemia, hypertension/volume and secondary hyperparathyroidism  VHQ:[id number], [facility]  HPI: Elizabeth Olsen is a 82 y.o. female with ESRD on HD MWF at [facility]. Her past medical history is significant for CHF and Afib who presents to the ED today with complaints left-sided hip pain ongoing shortness of breath. Reviewed outpatient hemodialysis records. Apparently, patient relocated from [facility] to be closer to family. It appears she was originally doing PD training here but it was advised for her to continue IHD 2nd to her cognitive decline during PD training. Patient's daughter at bedside who reports patient with recurrent pleural effusions requiring thoracentesis. According to her outpatient hemodialysis records, her last thoracentesis was on 05/25/23.   Seen and examined patient at bedside. She reports ongoing L-sided hip pain. Denies SOB, CP, palpitations, and N/V. Noted Bps are up, breathing is unlabored, and remains on RA. She doesn't appear severely volume overloaded. CXR showed no acute change from last XR done on 7/23 (vascular congestion and small R pleural effusion). CT chest shows large R and trace L pleural effusions. L hip XR (-) for left hip fracture. Her last hemodialysis was on 06/08/23. Noted some shortened treatments and overall, it appears she stays full time. Noted EdW was recently raised at her outpatient HD center. Per patient's daughter, patient is watching her fluid and sodium consumption at home. Plan for HD either tonight or 1st case tomorrow morning.   Past Medical History:  Diagnosis Date   A-fib (HCC)    Acquired hypothyroidism 10/13/2017   Acute diastolic CHF (congestive heart failure) (HCC) 11/20/2019   Acute respiratory failure with hypoxia (HCC) 10/13/2017   Acute systolic heart failure (HCC) 10/13/2017   Anemia    Anemia  associated with diabetes mellitus (HCC) 10/13/2017   Anemia of chronic disease 12/10/2017   Blood transfusion    Blood transfusion without reported diagnosis    with breast surgery   Breast cancer (HCC) 15 years ago   left    Cardiomyopathy (HCC) 07/23/2017   Ejection fraction 45-50% may; from September 2018   CHF (congestive heart failure) (HCC)    Chronic cholecystitis with calculus 01/22/2018   Colon polyp 03/2007   adenomatous   COVID-19 virus infection 11/20/2019   Diabetes mellitus    dx 1998.  was told prior to getting chemo that her bld sugar rose.  She thought it would go back   Dyspnea    d/t anemia   ESRD (end stage renal disease) on dialysis (HCC)    HD MWF   Essential hypertension 09/11/2018   GERD (gastroesophageal reflux disease)    Heart murmur    as child   Hernia, incisional    Hyperlipidemia    Hypertension    Hypothyroidism    Iron deficiency 07/23/2017   Near syncope 04/01/2018   Neuropathy    Osteoarthritis, hip, bilateral    PONV (postoperative nausea and vomiting)    Refractory anemia, unspecified (HCC) 12/10/2017   Short-term memory loss    per pt's daughter   Type 1 diabetes (HCC) 07/23/2017   Vertigo 06/24/2018   Vitamin D deficiency    Past Surgical History:  Procedure Laterality Date   A/V FISTULAGRAM N/A 05/16/2023   Procedure: A/V Fistulagram;  Surgeon: Victorino Sparrow, MD;  Location: [facility] INVASIVE CV LAB;  Service: Cardiovascular;  Laterality: N/A;   ABDOMINAL HYSTERECTOMY     APPENDECTOMY  BREAST SURGERY     CAPD REMOVAL N/A 05/31/2023   Procedure: REMOVAL CONTINUOUS AMBULATORY PERITONEAL DIALYSIS  (CAPD) CATHETER;  Surgeon: [name], MD;  Location: [facility] OR;  Service: Vascular;  Laterality: N/A;   CHOLECYSTECTOMY  01/22/2018   LAPROSCOPIC    CHOLECYSTECTOMY N/A 01/22/2018   Procedure: LAPAROSCOPIC CHOLECYSTECTOMY WITH INTRAOPERATIVE CHOLANGIOGRAM;  Surgeon: Manus Rudd, MD;  Location: [facility] OR;  Service: General;  Laterality:  N/A;   COLONOSCOPY  2013   due next 03-2017   EYE SURGERY     bil cataracts   IUD REMOVAL     with appendectomy   MASTECTOMY, MODIFIED RADICAL W/RECONSTRUCTION Left 1998   10 nodes out   PERIPHERAL VASCULAR INTERVENTION  05/16/2023   Procedure: PERIPHERAL VASCULAR INTERVENTION;  Surgeon: Victorino Sparrow, MD;  Location: [facility] INVASIVE CV LAB;  Service: Cardiovascular;;   THORACENTESIS Right 05/17/2023   Procedure: THORACENTESIS;  Surgeon: Lorin Glass, MD;  Location: [facility] ENDOSCOPY;  Service: Pulmonary;  Laterality: Right;   TUMOR EXCISION Left    x 2, neck, head   Family History  Problem Relation Age of Onset   Diabetes Other        both sides of family   Hypertension Father    Congestive Heart Failure Father    Peripheral vascular disease Father    Hypertension Maternal Grandfather    Hypertension Maternal Grandmother    Stomach cancer Maternal Grandmother        GGM   Heart attack Brother    Colon cancer Neg Hx    Esophageal cancer Neg Hx    Pancreatic cancer Neg Hx    Rectal cancer Neg Hx    Social History:  reports that she has never smoked. She has never used smokeless tobacco. She reports that she does not drink alcohol and does not use drugs. Allergies  Allergen Reactions   Erythromycin Shortness Of Breath and Swelling    "throat swelling", sob, thrashing ,    Penicillins Rash   Doxylamine-Dm Rash   Nyquil [Pseudoeph-Doxylamine-Dm-Apap] Rash   Sulfa Antibiotics Rash   Prior to Admission medications   Medication Sig Start Date End Date Taking? Authorizing Provider  apixaban (ELIQUIS) 2.5 MG TABS tablet Take 1 tablet (2.5 mg total) by mouth 2 (two) times daily. 05/06/23   Faith Rogue, DO  cloNIDine (CATAPRES) 0.1 MG tablet Take 1 tablet (0.1 mg total) by mouth daily as needed (FOR SYSTOLIC BLOOD PRESSURE GREATER THAN 170 AND OR DIASTOLIC FOR BLOOD PRESSURE GREATER 100). 05/18/23   Faith Rogue, DO  Continuous Blood Gluc Sensor (FREESTYLE LIBRE 14 DAY SENSOR) MISC 1  each by Other route See admin instructions. 10/22/19   [provider]  HUMALOG 100 UNIT/ML injection Inject 50-100 Units into the skin See admin instructions. Pump 02/12/20   [provider]  Insulin Disposable Pump (OMNIPOD DASH 5 PACK PODS) MISC SMARTSIG:SUB-Q Every 3 Days 05/11/20   [provider]  insulin glargine (LANTUS SOLOSTAR) 100 UNIT/ML Solostar Pen Inject 1-100 Units into the skin in the morning. Unknown Sliding scale 01/26/20   [provider]  Insulin Syringe-Needle U-100 (ULTICARE INSULIN SYRINGE) 30G X 1/2" 0.5 ML MISC Please use as previously directed Patient taking differently: 1 each by Other route See admin instructions. Please use as previously directed 05/18/23   Faith Rogue, DO  levothyroxine (SYNTHROID) 88 MCG tablet Take 1 tablet (88 mcg total) by mouth daily at 6 (six) AM. 05/07/23 06/06/23  Morene Crocker, MD  lidocaine-prilocaine (EMLA) cream Apply  1 Application topically once. 04/07/23   [provider]  metoprolol tartrate (LOPRESSOR) 25 MG tablet Take 12.5 mg by mouth daily as needed (IF HR more than 100 and syst BP more than 160 per daughter).    [provider]  nystatin (MYCOSTATIN/NYSTOP) powder Apply topically 2 (two) times daily. Patient taking differently: Apply 1 Application topically 2 (two) times daily. 05/18/23   Faith Rogue, DO  ONETOUCH VERIO test strip 1 each as needed for other (Glucose). 08/24/17   [provider]  oxyCODONE-acetaminophen (PERCOCET) 5-325 MG tablet Take 1 tablet by mouth every 6 (six) hours as needed for severe pain. 05/31/23 05/30/24  Graceann Congress, PA-C   Current Facility-Administered Medications  Medication Dose Route Frequency Provider Last Rate Last Admin   acetaminophen (TYLENOL) tablet 1,000 mg  1,000 mg Oral Q6H PRN Nooruddin, Jason Fila, MD       apixaban Everlene Balls) tablet 2.5 mg  2.5 mg Oral BID Nooruddin, Jason Fila, MD       [START ON 06/12/2023] Chlorhexidine Gluconate  Cloth 2 % PADS 6 each  6 each Topical Q0600 Berenda Morale, NP       cloNIDine (CATAPRES) tablet 0.1 mg  0.1 mg Oral Daily PRN Nooruddin, Jason Fila, MD       Melene Muller ON 06/12/2023] levothyroxine (SYNTHROID) tablet 88 mcg  88 mcg Oral Q0600 Nooruddin, Jason Fila, MD       metoprolol tartrate (LOPRESSOR) tablet 12.5 mg  12.5 mg Oral Daily PRN Nooruddin, Jason Fila, MD       ondansetron (ZOFRAN) injection 4 mg  4 mg Intravenous Once Marita Kansas, PA-C       Current Outpatient Medications  Medication Sig Dispense Refill   apixaban (ELIQUIS) 2.5 MG TABS tablet Take 1 tablet (2.5 mg total) by mouth 2 (two) times daily. 60 tablet 0   cloNIDine (CATAPRES) 0.1 MG tablet Take 1 tablet (0.1 mg total) by mouth daily as needed (FOR SYSTOLIC BLOOD PRESSURE GREATER THAN 170 AND OR DIASTOLIC FOR BLOOD PRESSURE GREATER 100). 30 tablet 0   Continuous Blood Gluc Sensor (FREESTYLE LIBRE 14 DAY SENSOR) MISC 1 each by Other route See admin instructions.     HUMALOG 100 UNIT/ML injection Inject 50-100 Units into the skin See admin instructions. Pump     Insulin Disposable Pump (OMNIPOD DASH 5 PACK PODS) MISC SMARTSIG:SUB-Q Every 3 Days     insulin glargine (LANTUS SOLOSTAR) 100 UNIT/ML Solostar Pen Inject 1-100 Units into the skin in the morning. Unknown Sliding scale     Insulin Syringe-Needle U-100 (ULTICARE INSULIN SYRINGE) 30G X 1/2" 0.5 ML MISC Please use as previously directed (Patient taking differently: 1 each by Other route See admin instructions. Please use as previously directed) 30 each 0   levothyroxine (SYNTHROID) 88 MCG tablet Take 1 tablet (88 mcg total) by mouth daily at 6 (six) AM. 30 tablet 0   lidocaine-prilocaine (EMLA) cream Apply 1 Application topically once.     metoprolol tartrate (LOPRESSOR) 25 MG tablet Take 12.5 mg by mouth daily as needed (IF HR more than 100 and syst BP more than 160 per daughter).     nystatin (MYCOSTATIN/NYSTOP) powder Apply topically 2 (two) times daily. (Patient taking differently:  Apply 1 Application topically 2 (two) times daily.) 15 g 0   ONETOUCH VERIO test strip 1 each as needed for other (Glucose).  12   oxyCODONE-acetaminophen (PERCOCET) 5-325 MG tablet Take 1 tablet by mouth every 6 (six) hours as needed for severe pain. 12 tablet 0   Labs:  Basic Metabolic Panel: Recent Labs  Lab 06/11/23 1210  NA 133*  K 5.3*  CL 95*  CO2 27  GLUCOSE 174*  BUN 77*  CREATININE 5.20*  CALCIUM 9.2   Liver Function Tests: Recent Labs  Lab 06/11/23 1210  AST 66*  ALT 38  ALKPHOS 112  BILITOT 1.0  PROT 6.7  ALBUMIN 3.4*   No results for input(s): "LIPASE", "AMYLASE" in the last 168 hours. No results for input(s): "AMMONIA" in the last 168 hours. CBC: Recent Labs  Lab 06/11/23 1018  WBC 11.1*  NEUTROABS 9.5*  HGB 11.7*  HCT 39.1  MCV 98.5  PLT 310   Cardiac Enzymes: No results for input(s): "CKTOTAL", "CKMB", "CKMBINDEX", "TROPONINI" in the last 168 hours. CBG: No results for input(s): "GLUCAP" in the last 168 hours. Iron Studies: No results for input(s): "IRON", "TIBC", "TRANSFERRIN", "FERRITIN" in the last 72 hours. Studies/Results: CT ABDOMEN PELVIS WO CONTRAST  Result Date: 06/11/2023 CLINICAL DATA:  Shortness of breath.  Acute abdominal pain EXAM: CT CHEST, ABDOMEN AND PELVIS WITHOUT CONTRAST TECHNIQUE: Multidetector CT imaging of the chest, abdomen and pelvis was performed following the standard protocol without IV contrast. RADIATION DOSE REDUCTION: This exam was performed according to the departmental dose-optimization program which includes automated exposure control, adjustment of the mA and/or kV according to patient size and/or use of iterative reconstruction technique. COMPARISON:  05/05/2023 CTA chest, prior CT abdomen 11/20/2017 FINDINGS: CT CHEST FINDINGS Cardiovascular: Atherosclerotic vascular calcification of the thoracic aorta, left main, left anterior descending, and right coronary artery. Mitral valve calcification. Mild cardiomegaly.  Mediastinum/Nodes: Right paratracheal node borderline prominent at 1.0 cm in short axis on image 17 series 3. Prior left axillary node dissection and mastectomy. Lungs/Pleura: Large right and trace left pleural effusions with passive atelectasis. Cylindrical bronchiectasis anteriorly in the right upper lobe. Mild secondary pulmonary lobular interstitial accentuation at the lung apices, cannot exclude mild interstitial edema. There is mild atelectasis in the lingula. Musculoskeletal: Left mastectomy; edema in the right breast, significance uncertain, cannot exclude mastitis. Thoracic spondylosis. CT ABDOMEN PELVIS FINDINGS Hepatobiliary: Prior cholecystectomy. Suspected mild nonspecific periportal edema. Pancreas: Unremarkable Spleen: Unremarkable Adrenals/Urinary Tract: Small amount of gas in the urinary bladder, query recent catheterization. Mild left hydronephrosis and hydroureter, specific cause not identified. Adrenal glands normal. Stomach/Bowel: Appendix surgically absent. There is some scattered diverticula along the colon. No dilated bowel. Vascular/Lymphatic: Atherosclerosis is present, including aortoiliac atherosclerotic disease. No pathologic adenopathy observed. Reproductive: Uterus absent.  No adnexal mass identified. Other: Small amount of pelvic ascites with trace free fluid along the paracolic gutters and adjacent to the liver. Musculoskeletal: Prominent bilateral digit degenerative hip arthropathy. 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 IMPRESSION: 1. Large right and trace left pleural effusions with passive atelectasis. 2. Edema confluent inferiorly in the right breast, cannot exclude mastitis, correlate with any associated symptoms. Prior left mastectomy. 3. Mild secondary pulmonary lobular interstitial accentuation at the lung apices, cannot exclude mild interstitial edema. 4. Mild cardiomegaly. 5. Atherosclerosis including left main, left anterior descending, and right coronary artery.  6. Mitral valve calcification. 7. Small amount of pelvic ascites with trace free fluid along the paracolic gutters and adjacent to the liver. 8. Mild left hydronephrosis and hydroureter, specific cause not identified. 9. Small amount of gas in the urinary bladder, query recent catheterization. Aortic Atherosclerosis (ICD10-I70.0). Electronically Signed   By: Gaylyn Rong M.D.   On: 06/11/2023 12:56   CT CHEST WO CONTRAST  Result Date: 06/11/2023 CLINICAL DATA:  Shortness of breath.  Acute abdominal pain EXAM: CT CHEST, ABDOMEN AND PELVIS WITHOUT CONTRAST TECHNIQUE: Multidetector CT imaging of the chest, abdomen and pelvis was performed following the standard protocol without IV contrast. RADIATION DOSE REDUCTION: This exam was performed according to the departmental dose-optimization program which includes automated exposure control, adjustment of the mA and/or kV according to patient size and/or use of iterative reconstruction technique. COMPARISON:  05/05/2023 CTA chest, prior CT abdomen 11/20/2017 FINDINGS: CT CHEST FINDINGS Cardiovascular: Atherosclerotic vascular calcification of the thoracic aorta, left main, left anterior descending, and right coronary artery. Mitral valve calcification. Mild cardiomegaly. Mediastinum/Nodes: Right paratracheal node borderline prominent at 1.0 cm in short axis on image 17 series 3. Prior left axillary node dissection and mastectomy. Lungs/Pleura: Large right and trace left pleural effusions with passive atelectasis. Cylindrical bronchiectasis anteriorly in the right upper lobe. Mild secondary pulmonary lobular interstitial accentuation at the lung apices, cannot exclude mild interstitial edema. There is mild atelectasis in the lingula. Musculoskeletal: Left mastectomy; edema in the right breast, significance uncertain, cannot exclude mastitis. Thoracic spondylosis. CT ABDOMEN PELVIS FINDINGS Hepatobiliary: Prior cholecystectomy. Suspected mild nonspecific periportal  edema. Pancreas: Unremarkable Spleen: Unremarkable Adrenals/Urinary Tract: Small amount of gas in the urinary bladder, query recent catheterization. Mild left hydronephrosis and hydroureter, specific cause not identified. Adrenal glands normal. Stomach/Bowel: Appendix surgically absent. There is some scattered diverticula along the colon. No dilated bowel. Vascular/Lymphatic: Atherosclerosis is present, including aortoiliac atherosclerotic disease. No pathologic adenopathy observed. Reproductive: Uterus absent.  No adnexal mass identified. Other: Small amount of pelvic ascites with trace free fluid along the paracolic gutters and adjacent to the liver. Musculoskeletal: Prominent bilateral digit degenerative hip arthropathy. 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 IMPRESSION: 1. Large right and trace left pleural effusions with passive atelectasis. 2. Edema confluent inferiorly in the right breast, cannot exclude mastitis, correlate with any associated symptoms. Prior left mastectomy. 3. Mild secondary pulmonary lobular interstitial accentuation at the lung apices, cannot exclude mild interstitial edema. 4. Mild cardiomegaly. 5. Atherosclerosis including left main, left anterior descending, and right coronary artery. 6. Mitral valve calcification. 7. Small amount of pelvic ascites with trace free fluid along the paracolic gutters and adjacent to the liver. 8. Mild left hydronephrosis and hydroureter, specific cause not identified. 9. Small amount of gas in the urinary bladder, query recent catheterization. Aortic Atherosclerosis (ICD10-I70.0). Electronically Signed   By: Gaylyn Rong M.D.   On: 06/11/2023 12:56   DG Hip Unilat With Pelvis 2-3 Views Left  Result Date: 06/11/2023 CLINICAL DATA:  Fall with left hip pain and discomfort. EXAM: DG HIP (WITH OR WITHOUT PELVIS) 2-3V LEFT COMPARISON:  None Available. FINDINGS: Bones are diffusely demineralized. SI joints and symphysis pubis unremarkable. No  evidence for pubic ramus fracture. AP and frog-leg lateral views of the left hip show no evidence for femoral neck fracture IMPRESSION: 1. No evidence for left hip fracture. 2. Diffuse bone demineralization. Electronically Signed   By: Kennith Center M.D.   On: 06/11/2023 10:41   DG Chest Portable 1 View  Result Date: 06/11/2023 CLINICAL DATA:  Fall today with left hip pain EXAM: PORTABLE CHEST 1 VIEW COMPARISON:  05/22/2023 FINDINGS: Chronic cardiomegaly and congested appearance of pulmonary vessels with small right pleural effusion. Postoperative left breast and axilla. IMPRESSION: 1. Stable from 05/22/2023.  No acute finding. 2. Cardiomegaly, vascular congestion, and small right pleural effusion. Electronically Signed   By: Tiburcio Pea M.D.   On: 06/11/2023 10:40    ROS: All others negative except those listed in  HPI.  Physical Exam: Vitals:   06/11/23 1430 06/11/23 1500 06/11/23 1530 06/11/23 1600  BP: (!) 189/68 (!) 191/75 (!) 179/72 (!) 184/73  Pulse: 72 73 73 74  Resp:      Temp:      TempSrc:      SpO2: 94% 96% 98% 91%     General: Awake, alert, chronically-ill appearing, NAD Head: Sclera anicteric Neck: Supple. No lymphadenopathy Lungs: Breathing is unlabored; (+) fine rales bilaterally Heart: RRR. No murmur, rubs or gallops.  Abdomen: soft, nontender, +BS Lower extremities: No LE edema or open wounds Neuro: AAOx3. Moves all extremities spontaneously. Dialysis Access: R AVF (+) B/T  Dialysis Orders:  MWF [facility] 4hrs, BFR 400, DFR 800,  EDW 58kg, 3K/ 2.5Ca No heparin Mircera 200 mcg q2wks - last 06/06/23  Last Labs: Hgb 11.7, K 5.3, Ca 9.2, Alb 3.4  Assessment/Plan: Respiratory failure 2nd volume overload: recurrent issues with pleural effusion requiring thoracentesis. Not severely overloaded on exam. May warrant pulmonology consult. Left hip pain - managed by primary ESRD - on HD MWF. Plan for HD either tonight or 1st case tomorrow  morning Hypertension/volume  - Blood pressures are up, resume home medications. Not severely overloaded. Plan for HD tonight or tomorrow morning. Anemia of CKD - Hgb 11.7. No indication for Fe or ESA at this time. Secondary Hyperparathyroidism - Will check phos in AM. Ca ok. Nutrition - Renal diet with fluid restriction  Salome Holmes, NP [facility] 06/11/2023, 4:39 PM

## 2023-06-12 DIAGNOSIS — J90 Pleural effusion, not elsewhere classified: Secondary | ICD-10-CM | POA: Diagnosis not present

## 2023-06-12 DIAGNOSIS — M25552 Pain in left hip: Secondary | ICD-10-CM

## 2023-06-12 DIAGNOSIS — K5909 Other constipation: Secondary | ICD-10-CM

## 2023-06-12 DIAGNOSIS — K5900 Constipation, unspecified: Secondary | ICD-10-CM

## 2023-06-12 DIAGNOSIS — E8770 Fluid overload, unspecified: Secondary | ICD-10-CM | POA: Diagnosis not present

## 2023-06-12 DIAGNOSIS — Z515 Encounter for palliative care: Secondary | ICD-10-CM

## 2023-06-12 DIAGNOSIS — I509 Heart failure, unspecified: Secondary | ICD-10-CM

## 2023-06-12 DIAGNOSIS — N186 End stage renal disease: Secondary | ICD-10-CM

## 2023-06-12 DIAGNOSIS — Z7189 Other specified counseling: Secondary | ICD-10-CM

## 2023-06-12 HISTORY — DX: Constipation, unspecified: K59.00

## 2023-06-12 LAB — GLUCOSE, CAPILLARY
Glucose-Capillary: 150 mg/dL — ABNORMAL HIGH (ref 70–99)
Glucose-Capillary: 309 mg/dL — ABNORMAL HIGH (ref 70–99)

## 2023-06-12 LAB — MRSA NEXT GEN BY PCR, NASAL: MRSA by PCR Next Gen: NOT DETECTED

## 2023-06-12 MED ORDER — CAMPHOR-MENTHOL 0.5-0.5 % EX LOTN
TOPICAL_LOTION | CUTANEOUS | Status: DC | PRN
  Filled 2023-06-12: qty 222

## 2023-06-12 MED ORDER — DOCUSATE SODIUM 100 MG PO CAPS
100.0000 mg | ORAL_CAPSULE | Freq: Every day | ORAL | Status: DC
  Administered 2023-06-12: 100 mg via ORAL
  Filled 2023-06-12: qty 1

## 2023-06-12 NOTE — Plan of Care (Signed)

## 2023-06-12 NOTE — Consult Note (Signed)
Palliative Care Consult Note                                  Date: 06/12/2023   Patient Name: Elizabeth Olsen  DOB: 04-Sep-1941  MRN: [id number]  Age / Sex: 82 y.o., female  PCP: [facility] Referring Physician: Ginnie Smart, MD  Reason for Consultation: Establishing goals of care  HPI/Patient Profile: 82 y.o. female  with past medical history of ESRD on HD MWF, chronic diastolic heart failure with EF of 50 to 55%, anemia of chronic disease, atrial fibrillation on Eliquis, type 1 diabetes,, hypertension, and hypothyroidism.  She presented to the ED on 06/11/2023 with left hip pain, shortness of breath, and BLE swelling.  BNP is >1000.  CT chest shows large right and trace left pleural effusions.  CT abdomen/pelvis shows small amount of pelvic ascites with trace free fluid. She is admitted to IMTS with acute heart failure exacerbation  She was recently admitted 05/16/2023 to 05/18/2023 for acute dyspnea secondary to right pleural effusion.    Subjective:   I have reviewed medical records including EPIC notes, labs and imaging, and assessed the patient at bedside.  She is alert and eating her lunch tray.  She has no new complaints.  I met with her daughter Byrd Hesselbach at bedside to discuss diagnosis, prognosis, and goals of care. She shares that she has been in a caregiver role for her parents for 10+ years.   I introduced Palliative Medicine as specialized medical care for people living with serious illness. It focuses on providing relief from the symptoms and stress of a serious illness.   Created space and opportunity for daughter to express thoughts and feelings regarding current medical situation. Values and goals of care were attempted to be elicited.  Life Review: Patient is originally from [place]  Her husband of 61 years passed away from lung cancer in November 2022.  She is retired, but previously worked as a  Nurse, learning disability for [employer].  She and her daughter spent the last 3.5 years in [place], but recently relocated back to [place] at the end of July.  Functional Status: At baseline, patient is ambulatory with a walker.  She has not had any recent falls. She is able to dress and feed herself and has a good appetite. She enjoys socialization and church-related activities.   GOC Discussion: We discussed patient's current illness and what it means in the larger context of her ongoing co-morbidities.  Current clinical status was reviewed.    We reviewed patient's multiple chronic medical conditions including ESRD on HD, chronic heart failure, and type 1 diabetes.  Byrd Hesselbach also suspects there is underlying cognitive impairment, and reports they have an upcoming appointment with a neurologist.  We also reviewed patient's acute issues of right pleural effusion and fluid overload in the setting of acute on chronic heart failure exacerbation and underlying renal failure.  Byrd Hesselbach understands that fluid overload will be a recurrent issue for her mother. However, she is hopeful that it can be managed more proactively in order to avoid frequent ED visits and hospitalizations.  She plans to explore the possibility of having and "extra" dialysis treatment (if needed) on an outpatient basis.  We discussed different paths of care regarding scope of treatment - full scope versus limited interventions (treat the treatable) versus comfort care.  Byrd Hesselbach is familiar with the concept of comfort care, as  her father was under hospice care when he died.  She speaks to the importance of quality of life, and is very mindful of this concept when making decisions for her mother. She reports that for now her mother continues to have fair/good quality of life.  However, Byrd Hesselbach understands that at some point her mother's quality of life will decline, and then she would consider transitioning away from full scope care.   Discussed the importance  of continued conversation with family and their medical providers regarding overall plan of care and treatment options.   Review of Systems  Musculoskeletal:        Right hip pain    Objective:   Primary Diagnoses: Present on Admission:  ESRD (end stage renal disease) (HCC)   Physical Exam Vitals reviewed.  Constitutional:      General: She is not in acute distress.    Comments: Frail, chronically ill-appearing  Pulmonary:     Effort: Pulmonary effort is normal.  Neurological:     Mental Status: She is alert.  Psychiatric:        Cognition and Memory: Cognition is impaired. Memory is impaired.     Vital Signs:  BP (!) 99/42   Pulse 79   Temp 97.6 F (36.4 C) (Oral)   Resp 16   Ht 5' 2.01" (1.575 m)   Wt 61.5 kg   SpO2 96%   BMI 24.79 kg/m   Palliative Assessment/Data: PPS 40-50%     Assessment & Plan:   SUMMARY OF RECOMMENDATIONS   Daughter confirms preference for CPR, but no intubation - full code for now, but will need to further discuss code status with regard to these specific preferences Full scope interventions GOC is medical stabilization PMT will continue to follow  Primary Decision Maker: NEXT OF KIN - daughter/Maria  Prognosis:  Unable to determine  Discharge Planning:   Plan for discharge home with daughter when medically stable  Discussed with: Dr. Thomasene Ripple   Thank you for allowing [place] to participate in the care of Elizabeth Olsen  MDM - High  Signed by: Sherlean Foot, NP Palliative Medicine Team  Team Phone # [phone]  For individual providers, please see AMION

## 2023-06-12 NOTE — Progress Notes (Signed)
HD#1 Subjective:  Overnight Events: She had dialysis with removal of 2.5 L of fluid.  She is reporting improvement in her breathing. No BM yet. Improvement in hip pain.  She is open to talking to palliative. Objective:  Vital signs in last 24 hours: Vitals:   06/12/23 1000 06/12/23 1022 06/12/23 1200 06/12/23 1240  BP: (!) 106/45 (!) 103/41 (!) 108/44 (!) 115/44  Pulse: 78 77 77 76  Resp:  18    Temp:  99.2 F (37.3 C) 98.9 F (37.2 C)   TempSrc:   Oral   SpO2: 96% 96% 98% 98%  Weight:      Height:       Supplemental O2: Room Air SpO2: 98 %  Physical Exam:   General: Pleasant, not in acute distress. CV: RRR. No m/r/g. No LE edema Pulmonary: Lungs are clear bilaterally.   Abdominal: Soft, nontender, nondistended. Normal bowel sounds. Extremities:  Skin: Warm and dry. No obvious rash or lesions.  Filed Weights   06/11/23 1952 06/12/23 0218  Weight: 61.5 kg 61.5 kg    No intake or output data in the 24 hours ending 06/12/23 1403 Net IO Since Admission: No IO data has been entered for this period [06/12/23 1403]  No results for input(s): "GLUCAP" in the last 72 hours.   Pertinent Labs:    Latest Ref Rng & Units 06/12/2023    2:41 AM 06/11/2023   10:18 AM 05/31/2023    6:23 AM  CBC  WBC 4.0 - 10.5 K/uL 22.4  11.1    Hemoglobin 12.0 - 15.0 g/dL 40.9  81.1  91.4   Hematocrit 36.0 - 46.0 % 36.9  39.1  35.0   Platelets 150 - 400 K/uL 334  310         Latest Ref Rng & Units 06/12/2023    2:41 AM 06/11/2023   12:10 PM 05/31/2023    6:23 AM  CMP  Glucose 70 - 99 mg/dL 782  956  71   BUN 8 - 23 mg/dL 25  77  17   Creatinine 0.44 - 1.00 mg/dL 2.13  0.86  5.78   Sodium 135 - 145 mmol/L 134  133  138   Potassium 3.5 - 5.1 mmol/L 4.4  5.3  3.7   Chloride 98 - 111 mmol/L 91  95  99   CO2 22 - 32 mmol/L 31  27    Calcium 8.9 - 10.3 mg/dL 9.1  9.2    Total Protein 6.5 - 8.1 g/dL  6.7    Total Bilirubin 0.3 - 1.2 mg/dL  1.0    Alkaline Phos 38 - 126 U/L  112    AST  15 - 41 U/L  66    ALT 0 - 44 U/L  38      Imaging: No results found.  Assessment/Plan:   Principal Problem:   Acute exacerbation of CHF (congestive heart failure) (HCC) Active Problems:   ESRD (end stage renal disease) (HCC)   Hypervolemia   Type 2 diabetes mellitus with chronic kidney disease on chronic dialysis, without long-term current use of insulin (HCC)   Left hip pain   Other constipation   Patient Summary:  Elizabeth Olsen is a 82 y.o. person living with a history of ESRD, CHF, breast cancer who presented with left hip pain and admitted for multifactorial volume overloaded state .  #Acute HF Exacerbation  ESRD HTN Patient had dialysis yesterday, 2.5 L of fluid removal.  Vitals have  been stable, she had episodes of hypotension with MAP< 55.  I suspect  this is due to increased fluid removal.  We reached out to nephrology, did not recommend restarting midodrine.  CT of the chest yesterday showed evidence of moderate right lung effusion.  She is satting normal on room air.  We reached out to pulmonary to consider thoracentesis.  Patient's daughter, is open to talking to palliative team.  -Will continue clonidine PRN and metoprolol to get better control of BP  -Follow-up with pulmonary recommendations. -Continue clonidine and metoprolol, consider adding hydralazine if BP still uncontrolled.  -PT OT evaluation.   Constipation No bowel movement yet.   -Increase the dose of senna to BID.   Chronic Problems  Afib: Continuing eliquis 2.5mg  BID T1DM: On omni pod, can consider using SSI Hypothyroidism: Continuing levothyroixine Anemia: Hb stable at 11.7    Diet: Renal diet IVF: PO intake VTE: apixaban (ELIQUIS) tablet 2.5 mg Start: 06/11/23 2200 SCDs Start: 06/11/23 1553apixaban (ELIQUIS) tablet 2.5 mg  Code: Full PT/OT: Pending ID:  Anti-infectives (From admission, onward)    None        Anticipated discharge to Home   Laretta Bolster, M.D.  Internal  Medicine Resident, PGY-1 [facility]  Pager: [phone] 2:03 PM, 06/12/2023   **Please contact the on call pager after 5 pm and on weekends at [phone].**

## 2023-06-12 NOTE — Progress Notes (Signed)
Nephrology Follow-Up Consult note   Assessment/Recommendations: Elizabeth Olsen is a/an 82 y.o. female with a past medical history significant for ESRD, admitted for shortness of breath and volume overlaod       Dialysis Orders:  MWF - [facility] 4hrs, BFR 400, DFR 800,  EDW 58kg, 3K/ 2.5Ca No heparin Mircera 200 mcg q2wks - last 06/06/23   Last Labs: Hgb 11.7, K 5.3, Ca 9.2, Alb 3.4   Assessment/Plan: Shortness of breath: recurrent issues with pleural effusion requiring thoracentesis.  Likely would benefit from pulmonology evaluation given recurrent issue. Left hip pain - managed by primary ESRD - on HD MWF.  Plan for dialysis again tomorrow Hypertension/volume  -blood pressure up-and-down.  Volume overload as above.  Given she is asymptomatic do not think she would benefit from midodrine this morning. Anemia of CKD - Hgb ~11. No indication for Fe or ESA at this time. Secondary Hyperparathyroidism - Ca at goal, f/u phos Nutrition -can liberalize diet. Limit fluids Mastitis - appreciated on CT. Plan per primary Recurrent pleural effusion - consider pulm involvement and thoracentesis GOC: Given repeated hospitalizations for similar issues and limited options recommend palliative care consult.  Appreciate help   Recommendations conveyed to primary service.    Darnell Level [facility] 06/12/2023 10:41 AM  ___________________________________________________________  CC: Shortness of breath  Interval History/Subjective: Patient and daughter state that she is doing much better today.  Breathing much improved.  Interested to hear from palliative care.   Medications:  Current Facility-Administered Medications  Medication Dose Route Frequency Provider Last Rate Last Admin   acetaminophen (TYLENOL) tablet 1,000 mg  1,000 mg Oral Q6H PRN Nooruddin, Jason Fila, MD       apixaban Everlene Balls) tablet 2.5 mg  2.5 mg Oral BID Nooruddin, Jason Fila, MD   2.5 mg at  06/12/23 1029   Chlorhexidine Gluconate Cloth 2 % PADS 6 each  6 each Topical Q0600 Berenda Morale, NP       cloNIDine (CATAPRES) tablet 0.1 mg  0.1 mg Oral Daily PRN Nooruddin, Jason Fila, MD       docusate sodium (COLACE) capsule 100 mg  100 mg Oral Daily Nooruddin, Saad, MD   100 mg at 06/12/23 1029   levothyroxine (SYNTHROID) tablet 88 mcg  88 mcg Oral Q0600 Nooruddin, Jason Fila, MD   88 mcg at 06/12/23 1610   metoprolol tartrate (LOPRESSOR) tablet 12.5 mg  12.5 mg Oral Daily PRN Nooruddin, Jason Fila, MD       ondansetron (ZOFRAN) injection 4 mg  4 mg Intravenous Once Ali, Amjad, PA-C       senna (SENOKOT) tablet 17.2 mg  2 tablet Oral Daily Nooruddin, Saad, MD   17.2 mg at 06/12/23 1029   sodium zirconium cyclosilicate (LOKELMA) packet 10 g  10 g Oral Once [name], MD       Current Outpatient Medications  Medication Sig Dispense Refill   apixaban (ELIQUIS) 2.5 MG TABS tablet Take 1 tablet (2.5 mg total) by mouth 2 (two) times daily. 60 tablet 0   cloNIDine (CATAPRES) 0.1 MG tablet Take 1 tablet (0.1 mg total) by mouth daily as needed (FOR SYSTOLIC BLOOD PRESSURE GREATER THAN 170 AND OR DIASTOLIC FOR BLOOD PRESSURE GREATER 100). 30 tablet 0   HUMALOG 100 UNIT/ML injection Inject 50-100 Units into the skin See admin instructions. Pump     Insulin Disposable Pump (OMNIPOD DASH 5 PACK PODS) MISC SMARTSIG:SUB-Q Every 3 Days     insulin glargine (LANTUS SOLOSTAR) 100 UNIT/ML  Solostar Pen Inject 1-100 Units into the skin in the morning. Unknown Sliding scale     levothyroxine (SYNTHROID) 88 MCG tablet Take 1 tablet (88 mcg total) by mouth daily at 6 (six) AM. 30 tablet 0   lidocaine-prilocaine (EMLA) cream Apply 1 Application topically once.     metoprolol tartrate (LOPRESSOR) 25 MG tablet Take 12.5 mg by mouth daily as needed (IF HR more than 100 and syst BP more than 160 per daughter).     nystatin (MYCOSTATIN/NYSTOP) powder Apply topically 2 (two) times daily. (Patient taking differently: Apply 1  Application topically 2 (two) times daily.) 15 g 0   oxyCODONE-acetaminophen (PERCOCET) 5-325 MG tablet Take 1 tablet by mouth every 6 (six) hours as needed for severe pain. 12 tablet 0   Continuous Blood Gluc Sensor (FREESTYLE LIBRE 14 DAY SENSOR) MISC 1 each by Other route See admin instructions.     Insulin Syringe-Needle U-100 (ULTICARE INSULIN SYRINGE) 30G X 1/2" 0.5 ML MISC Please use as previously directed (Patient taking differently: 1 each by Other route See admin instructions. Please use as previously directed) 30 each 0   ONETOUCH VERIO test strip 1 each as needed for other (Glucose).  12      Review of Systems: 10 systems reviewed and negative except per interval history/subjective  Physical Exam: Vitals:   06/12/23 1000 06/12/23 1022  BP: (!) 106/45 (!) 103/41  Pulse: 78 77  Resp:  18  Temp:  99.2 F (37.3 C)  SpO2: 96% 96%   No intake/output data recorded. No intake or output data in the 24 hours ending 06/12/23 1041 Constitutional: Chronically ill appearing, no acute distress ENMT: ears and nose without scars or lesions, MMM CV: normal rate, no edema Respiratory: Bilateral chest rise, decreased breath sounds bilaterally,, normal work of breathing Gastrointestinal: soft, non-tender, no palpable masses or hernias Skin: no visible lesions or rashes Psych: alert, judgement/insight impaired, appropriate mood and affect   Test Results I personally reviewed new and old clinical labs and radiology tests Lab Results  Component Value Date   NA 134 (L) 06/12/2023   K 4.4 06/12/2023   CL 91 (L) 06/12/2023   CO2 31 06/12/2023   BUN 25 (H) 06/12/2023   CREATININE 2.91 (H) 06/12/2023   CALCIUM 9.1 06/12/2023   ALBUMIN 3.4 (L) 06/11/2023   PHOS 3.3 05/18/2023    CBC Recent Labs  Lab 06/11/23 1018 06/12/23 0241  WBC 11.1* 22.4*  NEUTROABS 9.5*  --   HGB 11.7* 11.2*  HCT 39.1 36.9  MCV 98.5 96.6  PLT 310 334

## 2023-06-12 NOTE — ED Notes (Signed)
PT is c/o of a burning in her throat. Offered her ice chips for some relief.

## 2023-06-12 NOTE — Progress Notes (Signed)
PT Cancellation Note  Patient Details Name: JUSTENE HINCHCLIFFE MRN: [id number] DOB: 1941/04/03   Cancelled Treatment:    Reason Eval/Treat Not Completed: Medical issues which prohibited therapy. Pt BP 90's/30s with MAPs in 50s at this time. Pt's daughter reports this is much lower than typical for the patient. PT will hold at this time and attempt to return later when BP is more stable.   Arlyss Gandy 06/12/2023, 9:10 AM

## 2023-06-12 NOTE — Progress Notes (Signed)
Patient is AOX3. As per daughter who is at bedside, the patient is forgetful at baseline. Admitted to 6N in stable condition. Stage 2 to coccyx area. Mepilex applied. Right arm fistula +thrill/bruit. Right arm CGM and insulin administration device. MD aware. As per MD okay for patient to keep both devices in place. Daughter will mange devices.

## 2023-06-12 NOTE — Progress Notes (Signed)
   06/11/23 2328  Vitals  Temp 97.8 F (36.6 C)  Temp Source Axillary  BP Location Right Arm  BP Method Automatic  Patient Position (if appropriate) Lying  Pulse Rate Source Monitor  Oxygen Therapy  O2 Device Room Air  During Treatment Monitoring  HD Safety Checks Performed Yes  Intra-Hemodialysis Comments Tx completed  Post Treatment  Dialyzer Clearance Lightly streaked  Liters Processed 83.9  Tolerated HD Treatment Yes  Post-Hemodialysis Comments pt stable  AVG/AVF Arterial Site Held (minutes) 12 minutes  AVG/AVF Venous Site Held (minutes) 12 minutes  Fistula / Graft Forearm Arteriovenous fistula  No placement date or time found.   Access Location: Forearm  Access Type: Arteriovenous fistula  Site Condition No complications  Fistula / Graft Assessment Present;Thrill;Bruit  Status Deaccessed  Needle Size 15  Fistula / Graft Right Forearm Arteriovenous fistula  No placement date or time found.   Placed prior to admission: Yes  Orientation: Right  Access Location: Forearm  Access Type: Arteriovenous fistula  Site Condition No complications  Fistula / Graft Assessment Thrill;Bruit;Present  Status Deaccessed  Needle Size 15  Drainage Description None   Pt tolerated treatment well net UF. B/P 115/47 HR 89.mp66 spo2 98%. Temp 97.8

## 2023-06-12 NOTE — ED Notes (Signed)
ED TO INPATIENT HANDOFF REPORT  ED Nurse Name and Phone #: Nehemiah Settle [phone]  S Name/Age/Gender Elizabeth Olsen 82 y.o. female Room/Bed: 038C/038C  Code Status   Code Status: Full Code  Home/SNF/Other Home Patient oriented to: self, place, time, and situation Is this baseline? Yes   Triage Complete: Triage complete  Chief Complaint ESRD (end stage renal disease) (HCC) [N18.6] Left hip pain [M25.552] Acute exacerbation of CHF (congestive heart failure) (HCC) [I50.9] Hypervolemia, unspecified hypervolemia type [E87.70]  Triage Note Pt came in via POV d/t pain in Lt hip for a couple hrs before arrival to ED. A/Ox4, rates pain 10/10, denies any recent falls or injuries.    Allergies Allergies  Allergen Reactions   Erythromycin Shortness Of Breath and Swelling    "throat swelling", sob, thrashing ,    Penicillins Rash   Tape Other (See Comments)    Patient states that she prefers paper tape because other tapes tear skin.   Doxylamine-Dm Rash   Nyquil [Pseudoeph-Doxylamine-Dm-Apap] Rash   Sulfa Antibiotics Rash    Level of Care/Admitting Diagnosis ED Disposition     ED Disposition  Admit   Condition  --   Comment  Hospital Area: [facility] [[id number]]  Level of Care: Telemetry Medical [[id number]]  May admit patient to [facility] or [facility] if equivalent level of care is available:: Yes  Covid Evaluation: Asymptomatic - no recent exposure (last 10 days) testing not required  Diagnosis: Acute exacerbation of CHF (congestive heart failure) [facility]) [[id number]]  Admitting Physician: Ginnie Smart [[id number]]  Attending Physician: Ninetta Lights, JEFFREY C [[id number]]  Certification:: I certify this patient will need inpatient services for at least 2 midnights  Expected Medical Readiness: 06/14/2023          B Medical/Surgery History Past Medical History:  Diagnosis Date   A-fib (HCC)    Acquired hypothyroidism 10/13/2017   Acute diastolic CHF (congestive heart failure)  (HCC) 11/20/2019   Acute respiratory failure with hypoxia (HCC) 10/13/2017   Acute systolic heart failure (HCC) 10/13/2017   Anemia    Anemia associated with diabetes mellitus (HCC) 10/13/2017   Anemia of chronic disease 12/10/2017   Blood transfusion    Blood transfusion without reported diagnosis    with breast surgery   Breast cancer (HCC) 15 years ago   left    Cardiomyopathy (HCC) 07/23/2017   Ejection fraction 45-50% may; from September 2018   CHF (congestive heart failure) (HCC)    Chronic cholecystitis with calculus 01/22/2018   Colon polyp 03/2007   adenomatous   COVID-19 virus infection 11/20/2019   Diabetes mellitus    dx 1998.  was told prior to getting chemo that her bld sugar rose.  She thought it would go back   Dyspnea    d/t anemia   ESRD (end stage renal disease) on dialysis (HCC)    HD MWF   Essential hypertension 09/11/2018   GERD (gastroesophageal reflux disease)    Heart murmur    as child   Hernia, incisional    Hyperlipidemia    Hypertension    Hypothyroidism    Iron deficiency 07/23/2017   Near syncope 04/01/2018   Neuropathy    Osteoarthritis, hip, bilateral    PONV (postoperative nausea and vomiting)    Refractory anemia, unspecified (HCC) 12/10/2017   Short-term memory loss    per pt's daughter   Type 1 diabetes (HCC) 07/23/2017   Vertigo 06/24/2018   Vitamin D deficiency    Past Surgical  History:  Procedure Laterality Date   A/V FISTULAGRAM N/A 05/16/2023   Procedure: A/V Fistulagram;  Surgeon: Victorino Sparrow, MD;  Location: [facility] INVASIVE CV LAB;  Service: Cardiovascular;  Laterality: N/A;   ABDOMINAL HYSTERECTOMY     APPENDECTOMY     BREAST SURGERY     CAPD REMOVAL N/A 05/31/2023   Procedure: REMOVAL CONTINUOUS AMBULATORY PERITONEAL DIALYSIS  (CAPD) CATHETER;  Surgeon: [name], MD;  Location: [facility] OR;  Service: Vascular;  Laterality: N/A;   CHOLECYSTECTOMY  01/22/2018   LAPROSCOPIC    CHOLECYSTECTOMY N/A 01/22/2018    Procedure: LAPAROSCOPIC CHOLECYSTECTOMY WITH INTRAOPERATIVE CHOLANGIOGRAM;  Surgeon: Manus Rudd, MD;  Location: [facility] OR;  Service: General;  Laterality: N/A;   COLONOSCOPY  2013   due next 03-2017   EYE SURGERY     bil cataracts   IUD REMOVAL     with appendectomy   MASTECTOMY, MODIFIED RADICAL W/RECONSTRUCTION Left 1998   10 nodes out   PERIPHERAL VASCULAR INTERVENTION  05/16/2023   Procedure: PERIPHERAL VASCULAR INTERVENTION;  Surgeon: Victorino Sparrow, MD;  Location: [facility] INVASIVE CV LAB;  Service: Cardiovascular;;   THORACENTESIS Right 05/17/2023   Procedure: THORACENTESIS;  Surgeon: Lorin Glass, MD;  Location: [facility] ENDOSCOPY;  Service: Pulmonary;  Laterality: Right;   TUMOR EXCISION Left    x 2, neck, head     A IV Location/Drains/Wounds Patient Lines/Drains/Airways Status     Active Line/Drains/Airways     Name Placement date Placement time Site Days   Peripheral IV 06/11/23 22 G 1.75" Left Forearm 06/11/23  1207  Forearm  1   Fistula / Graft Forearm Arteriovenous fistula --  --  Forearm  --   Fistula / Graft Right Forearm Arteriovenous fistula --  --  Forearm  --   Incision - 4 Ports Abdomen 1: Umbilicus 2: Upper;Medial 3: Right;Medial 4: Right;Lateral 01/22/18  0821  -- 1967   Wound / Incision (Open or Dehisced) 05/17/23 Irritant Dermatitis (Moisture Associated Skin Damage) Groin Left;Upper Bilateral groin & bilateral breasts 05/17/23  2122  Groin  26            Intake/Output Last 24 hours No intake or output data in the 24 hours ending 06/12/23 1134  Labs/Imaging Results for orders placed or performed during the hospital encounter of 06/11/23 (from the past 48 hour(s))  CBC with Differential/Platelet     Status: Abnormal   Collection Time: 06/11/23 10:18 AM  Result Value Ref Range   WBC 11.1 (H) 4.0 - 10.5 K/uL   RBC 3.97 3.87 - 5.11 MIL/uL   Hemoglobin 11.7 (L) 12.0 - 15.0 g/dL   HCT 62.9 52.8 - 41.3 %   MCV 98.5 80.0 - 100.0 fL   MCH 29.5 26.0 - 34.0 pg    MCHC 29.9 (L) 30.0 - 36.0 g/dL   RDW 24.4 (H) 01.0 - 27.2 %   Platelets 310 150 - 400 K/uL   nRBC 0.0 0.0 - 0.2 %   Neutrophils Relative % 86 %   Neutro Abs 9.5 (H) 1.7 - 7.7 K/uL   Lymphocytes Relative 4 %   Lymphs Abs 0.5 (L) 0.7 - 4.0 K/uL   Monocytes Relative 9 %   Monocytes Absolute 1.0 0.1 - 1.0 K/uL   Eosinophils Relative 0 %   Eosinophils Absolute 0.0 0.0 - 0.5 K/uL   Basophils Relative 0 %   Basophils Absolute 0.0 0.0 - 0.1 K/uL   Immature Granulocytes 1 %   Abs Immature Granulocytes 0.05 0.00 -  0.07 K/uL    Comment: Performed at [facility], [address], [place], [place] [address]  Brain natriuretic peptide     Status: Abnormal   Collection Time: 06/11/23 10:18 AM  Result Value Ref Range   B Natriuretic Peptide 1,086.3 (H) 0.0 - 100.0 pg/mL    Comment: Performed at [facility], [address] [place], [place] [address]  Comprehensive metabolic panel     Status: Abnormal   Collection Time: 06/11/23 12:10 PM  Result Value Ref Range   Sodium 133 (L) 135 - 145 mmol/L   Potassium 5.3 (H) 3.5 - 5.1 mmol/L   Chloride 95 (L) 98 - 111 mmol/L   CO2 27 22 - 32 mmol/L   Glucose, Bld 174 (H) 70 - 99 mg/dL    Comment: Glucose reference range applies only to samples taken after fasting for at least 8 hours.   BUN 77 (H) 8 - 23 mg/dL   Creatinine, Ser 1.02 (H) 0.44 - 1.00 mg/dL   Calcium 9.2 8.9 - 72.5 mg/dL   Total Protein 6.7 6.5 - 8.1 g/dL   Albumin 3.4 (L) 3.5 - 5.0 g/dL   AST 66 (H) 15 - 41 U/L   ALT 38 0 - 44 U/L   Alkaline Phosphatase 112 38 - 126 U/L   Total Bilirubin 1.0 0.3 - 1.2 mg/dL   GFR, Estimated 8 (L) >60 mL/min    Comment: (NOTE) Calculated using the CKD-EPI Creatinine Equation (2021)    Anion gap 11 5 - 15    Comment: Performed at [facility], [address], [place], [place] [address]  Magnesium     Status: None   Collection Time: 06/11/23 12:10 PM  Result Value Ref Range   Magnesium 2.2 1.7 - 2.4 mg/dL    Comment: Performed at  [facility], [address] [place], [place] [address]  Hepatitis B surface antigen     Status: None   Collection Time: 06/12/23  2:41 AM  Result Value Ref Range   Hepatitis B Surface Ag NON REACTIVE NON REACTIVE    Comment: Performed at [facility], [address], [place], [place] [address]  Basic metabolic panel     Status: Abnormal   Collection Time: 06/12/23  2:41 AM  Result Value Ref Range   Sodium 134 (L) 135 - 145 mmol/L   Potassium 4.4 3.5 - 5.1 mmol/L   Chloride 91 (L) 98 - 111 mmol/L   CO2 31 22 - 32 mmol/L   Glucose, Bld 106 (H) 70 - 99 mg/dL    Comment: Glucose reference range applies only to samples taken after fasting for at least 8 hours.   BUN 25 (H) 8 - 23 mg/dL   Creatinine, Ser 3.87 (H) 0.44 - 1.00 mg/dL   Calcium 9.1 8.9 - 56.4 mg/dL   GFR, Estimated 16 (L) >60 mL/min    Comment: (NOTE) Calculated using the CKD-EPI Creatinine Equation (2021)    Anion gap 12 5 - 15    Comment: Performed at [facility], [address], [place], [place] [address]  CBC     Status: Abnormal   Collection Time: 06/12/23  2:41 AM  Result Value Ref Range   WBC 22.4 (H) 4.0 - 10.5 K/uL   RBC 3.82 (L) 3.87 - 5.11 MIL/uL   Hemoglobin 11.2 (L) 12.0 - 15.0 g/dL   HCT 18.8 41.6 - 60.6 %   MCV 96.6 80.0 - 100.0  fL   MCH 29.3 26.0 - 34.0 pg   MCHC 30.4 30.0 - 36.0 g/dL   RDW 40.9 (H) 81.1 - 91.4 %   Platelets 334 150 - 400 K/uL   nRBC 0.0 0.0 - 0.2 %    Comment: Performed at [facility], [address], [place], [place] [address]   CT ABDOMEN PELVIS WO CONTRAST  Result Date: 06/11/2023 CLINICAL DATA:  Shortness of breath.  Acute abdominal pain EXAM: CT CHEST, ABDOMEN AND PELVIS WITHOUT CONTRAST TECHNIQUE: Multidetector CT imaging of the chest, abdomen and pelvis was performed following the standard protocol without IV contrast. RADIATION DOSE REDUCTION: This exam was performed according to the departmental dose-optimization program which includes automated  exposure control, adjustment of the mA and/or kV according to patient size and/or use of iterative reconstruction technique. COMPARISON:  05/05/2023 CTA chest, prior CT abdomen 11/20/2017 FINDINGS: CT CHEST FINDINGS Cardiovascular: Atherosclerotic vascular calcification of the thoracic aorta, left main, left anterior descending, and right coronary artery. Mitral valve calcification. Mild cardiomegaly. Mediastinum/Nodes: Right paratracheal node borderline prominent at 1.0 cm in short axis on image 17 series 3. Prior left axillary node dissection and mastectomy. Lungs/Pleura: Large right and trace left pleural effusions with passive atelectasis. Cylindrical bronchiectasis anteriorly in the right upper lobe. Mild secondary pulmonary lobular interstitial accentuation at the lung apices, cannot exclude mild interstitial edema. There is mild atelectasis in the lingula. Musculoskeletal: Left mastectomy; edema in the right breast, significance uncertain, cannot exclude mastitis. Thoracic spondylosis. CT ABDOMEN PELVIS FINDINGS Hepatobiliary: Prior cholecystectomy. Suspected mild nonspecific periportal edema. Pancreas: Unremarkable Spleen: Unremarkable Adrenals/Urinary Tract: Small amount of gas in the urinary bladder, query recent catheterization. Mild left hydronephrosis and hydroureter, specific cause not identified. Adrenal glands normal. Stomach/Bowel: Appendix surgically absent. There is some scattered diverticula along the colon. No dilated bowel. Vascular/Lymphatic: Atherosclerosis is present, including aortoiliac atherosclerotic disease. No pathologic adenopathy observed. Reproductive: Uterus absent.  No adnexal mass identified. Other: Small amount of pelvic ascites with trace free fluid along the paracolic gutters and adjacent to the liver. Musculoskeletal: Prominent bilateral digit degenerative hip arthropathy. 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 IMPRESSION: 1. Large right and trace left pleural  effusions with passive atelectasis. 2. Edema confluent inferiorly in the right breast, cannot exclude mastitis, correlate with any associated symptoms. Prior left mastectomy. 3. Mild secondary pulmonary lobular interstitial accentuation at the lung apices, cannot exclude mild interstitial edema. 4. Mild cardiomegaly. 5. Atherosclerosis including left main, left anterior descending, and right coronary artery. 6. Mitral valve calcification. 7. Small amount of pelvic ascites with trace free fluid along the paracolic gutters and adjacent to the liver. 8. Mild left hydronephrosis and hydroureter, specific cause not identified. 9. Small amount of gas in the urinary bladder, query recent catheterization. Aortic Atherosclerosis (ICD10-I70.0). Electronically Signed   By: Gaylyn Rong M.D.   On: 06/11/2023 12:56   CT CHEST WO CONTRAST  Result Date: 06/11/2023 CLINICAL DATA:  Shortness of breath.  Acute abdominal pain EXAM: CT CHEST, ABDOMEN AND PELVIS WITHOUT CONTRAST TECHNIQUE: Multidetector CT imaging of the chest, abdomen and pelvis was performed following the standard protocol without IV contrast. RADIATION DOSE REDUCTION: This exam was performed according to the departmental dose-optimization program which includes automated exposure control, adjustment of the mA and/or kV according to patient size and/or use of iterative reconstruction technique. COMPARISON:  05/05/2023 CTA chest, prior CT abdomen 11/20/2017 FINDINGS: CT CHEST FINDINGS Cardiovascular: Atherosclerotic vascular calcification of the thoracic aorta, left main, left anterior descending, and right  coronary artery. Mitral valve calcification. Mild cardiomegaly. Mediastinum/Nodes: Right paratracheal node borderline prominent at 1.0 cm in short axis on image 17 series 3. Prior left axillary node dissection and mastectomy. Lungs/Pleura: Large right and trace left pleural effusions with passive atelectasis. Cylindrical bronchiectasis anteriorly in the  right upper lobe. Mild secondary pulmonary lobular interstitial accentuation at the lung apices, cannot exclude mild interstitial edema. There is mild atelectasis in the lingula. Musculoskeletal: Left mastectomy; edema in the right breast, significance uncertain, cannot exclude mastitis. Thoracic spondylosis. CT ABDOMEN PELVIS FINDINGS Hepatobiliary: Prior cholecystectomy. Suspected mild nonspecific periportal edema. Pancreas: Unremarkable Spleen: Unremarkable Adrenals/Urinary Tract: Small amount of gas in the urinary bladder, query recent catheterization. Mild left hydronephrosis and hydroureter, specific cause not identified. Adrenal glands normal. Stomach/Bowel: Appendix surgically absent. There is some scattered diverticula along the colon. No dilated bowel. Vascular/Lymphatic: Atherosclerosis is present, including aortoiliac atherosclerotic disease. No pathologic adenopathy observed. Reproductive: Uterus absent.  No adnexal mass identified. Other: Small amount of pelvic ascites with trace free fluid along the paracolic gutters and adjacent to the liver. Musculoskeletal: Prominent bilateral digit degenerative hip arthropathy. 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 IMPRESSION: 1. Large right and trace left pleural effusions with passive atelectasis. 2. Edema confluent inferiorly in the right breast, cannot exclude mastitis, correlate with any associated symptoms. Prior left mastectomy. 3. Mild secondary pulmonary lobular interstitial accentuation at the lung apices, cannot exclude mild interstitial edema. 4. Mild cardiomegaly. 5. Atherosclerosis including left main, left anterior descending, and right coronary artery. 6. Mitral valve calcification. 7. Small amount of pelvic ascites with trace free fluid along the paracolic gutters and adjacent to the liver. 8. Mild left hydronephrosis and hydroureter, specific cause not identified. 9. Small amount of gas in the urinary bladder, query recent  catheterization. Aortic Atherosclerosis (ICD10-I70.0). Electronically Signed   By: Gaylyn Rong M.D.   On: 06/11/2023 12:56   DG Hip Unilat With Pelvis 2-3 Views Left  Result Date: 06/11/2023 CLINICAL DATA:  Fall with left hip pain and discomfort. EXAM: DG HIP (WITH OR WITHOUT PELVIS) 2-3V LEFT COMPARISON:  None Available. FINDINGS: Bones are diffusely demineralized. SI joints and symphysis pubis unremarkable. No evidence for pubic ramus fracture. AP and frog-leg lateral views of the left hip show no evidence for femoral neck fracture IMPRESSION: 1. No evidence for left hip fracture. 2. Diffuse bone demineralization. Electronically Signed   By: Kennith Center M.D.   On: 06/11/2023 10:41   DG Chest Portable 1 View  Result Date: 06/11/2023 CLINICAL DATA:  Fall today with left hip pain EXAM: PORTABLE CHEST 1 VIEW COMPARISON:  05/22/2023 FINDINGS: Chronic cardiomegaly and congested appearance of pulmonary vessels with small right pleural effusion. Postoperative left breast and axilla. IMPRESSION: 1. Stable from 05/22/2023.  No acute finding. 2. Cardiomegaly, vascular congestion, and small right pleural effusion. Electronically Signed   By: Tiburcio Pea M.D.   On: 06/11/2023 10:40    Pending Labs Unresulted Labs (From admission, onward)     Start     Ordered   06/11/23 1531  Hepatitis B surface antibody,quantitative  (New Admission Hemo Labs (Hepatitis B))  ONCE - URGENT,   URGENT        06/11/23 1530            Vitals/Pain Today's Vitals   06/12/23 0924 06/12/23 0945 06/12/23 1000 06/12/23 1022  BP: (!) 99/42 (!) 98/40 (!) 106/45 (!) 103/41  Pulse: 79 76 78 77  Resp: 16   18  Temp:  99.2 F (37.3 C)  TempSrc:      SpO2: 96% 96% 96% 96%  Weight:      Height:      PainSc: 0-No pain       Isolation Precautions No active isolations  Medications Medications  ondansetron (ZOFRAN) injection 4 mg (4 mg Intravenous Not Given 06/11/23 1214)  Chlorhexidine Gluconate Cloth 2 %  PADS 6 each (has no administration in time range)  apixaban (ELIQUIS) tablet 2.5 mg (2.5 mg Oral Given 06/12/23 1029)  cloNIDine (CATAPRES) tablet 0.1 mg (has no administration in time range)  levothyroxine (SYNTHROID) tablet 88 mcg (88 mcg Oral Given 06/12/23 0628)  metoprolol tartrate (LOPRESSOR) tablet 12.5 mg (has no administration in time range)  acetaminophen (TYLENOL) tablet 1,000 mg (has no administration in time range)  senna (SENOKOT) tablet 17.2 mg (17.2 mg Oral Given 06/12/23 1029)  sodium zirconium cyclosilicate (LOKELMA) packet 10 g (10 g Oral Not Given 06/11/23 2100)  docusate sodium (COLACE) capsule 100 mg (100 mg Oral Given 06/12/23 1029)  fentaNYL (SUBLIMAZE) injection 12.5 mcg (12.5 mcg Intravenous Given 06/11/23 1211)  ondansetron (ZOFRAN-ODT) disintegrating tablet 8 mg (8 mg Oral Given 06/11/23 1035)  HYDROcodone-acetaminophen (NORCO/VICODIN) 5-325 MG per tablet 1 tablet (1 tablet Oral Given 06/11/23 1113)    Mobility walks with device     Focused Assessments    R Recommendations: See Admitting Provider Note  Report given to:   Additional Notes:  Dialysis M/W/F

## 2023-06-12 NOTE — ED Notes (Signed)
Shift report received from [facility], Assumed care of patient at this time.

## 2023-06-12 NOTE — Evaluation (Signed)
Physical Therapy Evaluation Patient Details Name: Elizabeth Olsen MRN: [id number] DOB: 1941/09/29 Today's Date: 06/12/2023  History of Present Illness  82 yo female presents to [facility] on 06/11/2023 with abdominal and LLE pain. Pt found to have pleural effusion, admitted for management of volume overload. PMH include CAD, A-fib, CHF, multiple myeloma in relapse, diabetes, ESRD, breast cancer, and hypothyroidism.  Clinical Impression  Pt presents to PT with deficits in strength, power, endurance. Pt is able to ambulate for household distances with support of rollator, denies pain in LLE when mobilizing but does report neck pain when in supine. Pt will benefit from frequent mobilization in an effort to improve activity tolerance and reduce risk for falls. PT recommends discharge home with HHPT and continued support from family.        If plan is discharge home, recommend the following: A little help with walking and/or transfers;A little help with bathing/dressing/bathroom;Assistance with cooking/housework;Direct supervision/assist for medications management;Direct supervision/assist for financial management;Assist for transportation;Help with stairs or ramp for entrance   Can travel by private vehicle        Equipment Recommendations None recommended by PT  Recommendations for Other Services       Functional Status Assessment Patient has had a recent decline in their functional status and demonstrates the ability to make significant improvements in function in a reasonable and predictable amount of time.     Precautions / Restrictions Precautions Precautions: Fall Restrictions Weight Bearing Restrictions: No      Mobility  Bed Mobility Overal bed mobility: Needs Assistance Bed Mobility: Supine to Sit, Sit to Supine     Supine to sit: Min assist Sit to supine: Min assist        Transfers Overall transfer level: Needs assistance Equipment used: Rollator (4 wheels) Transfers: Sit  to/from Stand Sit to Stand: Contact guard assist                Ambulation/Gait Ambulation/Gait assistance: Supervision Gait Distance (Feet): 150 Feet Assistive device: Rollator (4 wheels) Gait Pattern/deviations: Step-through pattern Gait velocity: reduced Gait velocity interpretation: <1.8 ft/sec, indicate of risk for recurrent falls   General Gait Details: slowed step-through gait  Stairs            Wheelchair Mobility     Tilt Bed    Modified Rankin (Stroke Patients Only)       Balance Overall balance assessment: Needs assistance Sitting-balance support: No upper extremity supported, Feet supported Sitting balance-Leahy Scale: Good     Standing balance support: Single extremity supported, Reliant on assistive device for balance Standing balance-Leahy Scale: Poor                               Pertinent Vitals/Pain Pain Assessment Pain Assessment: No/denies pain    Home Living Family/patient expects to be discharged to:: Private residence Living Arrangements: Children Available Help at Discharge: Family;Available 24 hours/day Type of Home: Apartment Home Access: Elevator       Home Layout: One level Home Equipment: Pharmacist, hospital (2 wheels);Grab bars - toilet;Rollator (4 wheels)      Prior Function Prior Level of Function : Needs assist             Mobility Comments: ambulates with rollator ADLs Comments: as needed help from daughter with dressing and bathing, sponge bathes presently given PD access but soon to be removed     Extremity/Trunk Assessment   Upper Extremity Assessment Upper  Extremity Assessment: Generalized weakness    Lower Extremity Assessment Lower Extremity Assessment: Generalized weakness    Cervical / Trunk Assessment Cervical / Trunk Assessment: Kyphotic  Communication   Communication Communication: No apparent difficulties Cueing Techniques: Verbal cues  Cognition Arousal:  Alert Behavior During Therapy: WFL for tasks assessed/performed Overall Cognitive Status: Within Functional Limits for tasks assessed                                          General Comments General comments (skin integrity, edema, etc.): VSS on RA, BP 122/43 at end of mobility    Exercises     Assessment/Plan    PT Assessment Patient needs continued PT services  PT Problem List Decreased strength;Decreased activity tolerance;Decreased balance;Decreased mobility;Decreased knowledge of use of DME       PT Treatment Interventions DME instruction;Gait training;Functional mobility training;Therapeutic activities;Therapeutic exercise;Stair training;Balance training;Neuromuscular re-education;Patient/family education    PT Goals (Current goals can be found in the Care Plan section)  Acute Rehab PT Goals Patient Stated Goal: to return home PT Goal Formulation: With patient/family Time For Goal Achievement: 06/26/23 Potential to Achieve Goals: Good    Frequency Min 1X/week     Co-evaluation               AM-PAC PT "6 Clicks" Mobility  Outcome Measure Help needed turning from your back to your side while in a flat bed without using bedrails?: A Little Help needed moving from lying on your back to sitting on the side of a flat bed without using bedrails?: A Little Help needed moving to and from a bed to a chair (including a wheelchair)?: A Little Help needed standing up from a chair using your arms (e.g., wheelchair or bedside chair)?: A Little Help needed to walk in hospital room?: A Little Help needed climbing 3-5 steps with a railing? : Total 6 Click Score: 16    End of Session   Activity Tolerance: Patient tolerated treatment well Patient left: in bed;with call bell/phone within reach;with family/visitor present Nurse Communication: Mobility status PT Visit Diagnosis: Other abnormalities of gait and mobility (R26.89);Muscle weakness (generalized)  (M62.81)    Time: 1610-9604 PT Time Calculation (min) (ACUTE ONLY): 16 min   Charges:   PT Evaluation $PT Eval Low Complexity: 1 Low   PT General Charges $$ ACUTE PT VISIT: 1 Visit         Arlyss Gandy, PT, DPT Acute Rehabilitation Office [phone]   Arlyss Gandy 06/12/2023, 11:44 AM

## 2023-06-12 NOTE — TOC Initial Note (Signed)
Transition of Care (TOC) - Initial/Assessment Note   Spoke to patient and daughter at bedside.  Patient from home with daughter. Patient has Rollator, walker and cane.   Per notes patient was arranged with [facility] on last discharge. Daughter states she did not hear from them.   Spoke to Amy with [facility] . She will investigate what happened , but she will call daughter herself at discharge to arrange admission visit . NCM confirmed contact information. Patient and daughter aware .   Will need orders , secure chatted MD  Patient Details  Name: Elizabeth Olsen MRN: [id number] Date of Birth: 04-20-1941  Transition of Care [facility]) CM/SW Contact:    Kingsley Plan, RN Phone Number: 06/12/2023, 4:18 PM  Clinical Narrative:                   Expected Discharge Plan: Home w Home Health Services Barriers to Discharge: Continued Medical Work up   Patient Goals and CMS Choice Patient states their goals for this hospitalization and ongoing recovery are:: to go home CMS Medicare.gov Compare Post Acute Care list provided to:: Patient Choice offered to / list presented to : Patient [place] ownership interest in [facility].provided to:: Patient    Expected Discharge Plan and Services   Discharge Planning Services: CM Consult Post Acute Care Choice: Home Health Living arrangements for the past 2 months: Apartment                 DME Arranged: N/A         HH Arranged: PT, OT HH Agency: [facility] Date HH Agency Contacted: 06/12/23 Time HH Agency Contacted: 1616 Representative spoke with at [facility] Agency: Amy  Prior Living Arrangements/Services Living arrangements for the past 2 months: Apartment Lives with:: Adult Children Patient language and need for interpreter reviewed:: Yes Do you feel safe going back to the place where you live?: Yes      Need for Family Participation in Patient Care: Yes (Comment) Care giver support system in place?: Yes  (comment) Current home services: DME Criminal Activity/Legal Involvement Pertinent to Current Situation/Hospitalization: No - Comment as needed  Activities of Daily Living Home Assistive Devices/Equipment: Environmental consultant (specify type), Shower chair with back ADL Screening (condition at time of admission) Patient's cognitive ability adequate to safely complete daily activities?: Yes Is the patient deaf or have difficulty hearing?: No Does the patient have difficulty seeing, even when wearing glasses/contacts?: No Does the patient have difficulty concentrating, remembering, or making decisions?: Yes Patient able to express need for assistance with ADLs?: Yes Does the patient have difficulty dressing or bathing?: Yes Independently performs ADLs?: No Communication: Independent Is this a change from baseline?: Pre-admission baseline Dressing (OT): Independent Grooming: Independent Feeding: Independent Bathing: Independent Toileting: Independent In/Out Bed: Independent Walks in Home: Independent Does the patient have difficulty walking or climbing stairs?: Yes Weakness of Legs: Both Weakness of Arms/Hands: Both  Permission Sought/Granted   Permission granted to share information with : Yes, Verbal Permission Granted  Share Information with NAME: Lanier Prude           Emotional Assessment Appearance:: Appears stated age Attitude/Demeanor/Rapport: Engaged Affect (typically observed): Accepting Orientation: : Oriented to Self, Oriented to Place, Oriented to  Time, Oriented to Situation Alcohol / Substance Use: Not Applicable Psych Involvement: No (comment)  Admission diagnosis:  ESRD (end stage renal disease) (HCC) [N18.6] Left hip pain [M25.552] Acute exacerbation of CHF (congestive heart failure) (HCC) [I50.9] Hypervolemia, unspecified  hypervolemia type [E87.70] Patient Active Problem List   Diagnosis Date Noted   Left hip pain 06/12/2023   Other constipation 06/12/2023   Acute  exacerbation of CHF (congestive heart failure) (HCC) 06/11/2023   Hypervolemia 06/11/2023   Type 2 diabetes mellitus with chronic kidney disease on chronic dialysis, without long-term current use of insulin (HCC) 06/11/2023   Pleural effusion 05/17/2023   Acute dyspnea 05/16/2023   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   Iron deficiency anemia, unspecified 05/08/2023   Atherosclerotic heart disease of native coronary artery without angina pectoris 05/08/2023   Allergy, unspecified, initial encounter 05/08/2023   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   Atrial fibrillation with RVR (HCC) 05/05/2023   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) 02/14/2021   ESRD (end stage renal disease) (HCC) 09/21/2020   Congestive heart failure (CHF) (HCC) 04/12/2020   Vitamin D deficiency 10/06/2019   Essential hypertension 09/11/2018   Vertigo 06/24/2018   Anemia of chronic disease 12/10/2017   Acquired hypothyroidism 10/13/2017   Type 1 diabetes (HCC) 07/23/2017   Hyperlipidemia 07/23/2017   Iron deficiency 07/23/2017   Cardiomyopathy (HCC) 07/23/2017   PCP:  [facility]:   [id number] [place], [place] - [address] AT [facility] [address] [place] [address] Phone: [phone] Fax: [phone]  [facility] [address] [place] [address] Phone: [phone] Fax: [phone]     Social Determinants of Health (SDOH) Social History: SDOH Screenings   Food Insecurity: No Food Insecurity (06/12/2023)  Housing: Patient Declined (06/12/2023)  Transportation Needs: No Transportation Needs (06/12/2023)  Utilities: Not At Risk (06/12/2023)  Depression (PHQ2-9): Low Risk  (08/24/2019)  Tobacco Use: Low Risk  (06/11/2023)   SDOH Interventions:     Readmission Risk Interventions     No data to display

## 2023-06-12 NOTE — Consult Note (Addendum)
NAME:  Elizabeth Olsen, MRN:  [id number], DOB:  January 04, 1941, LOS: 1 ADMISSION DATE:  06/11/2023, CONSULTATION DATE: 8/13 REFERRING MD: Dr. Ninetta Lights, CHIEF COMPLAINT: Pleural effusion  History of Present Illness:  82 year old female with past medical history as below, which is significant for ESRD on hemodialysis MWF, HFpEF, breast cancer, anemia, diabetes mellitus, atrial fibrillation, and peripheral neuropathy.  Recently moved from [place] to [place].  She was recently admitted in July of this year for dyspnea which did not improve with 4.3 L of fluid removed at dialysis.  Chest x-ray showed pleural effusion on the right.  Thoracentesis was done with 750 cc of drainage.  Fluid was a transudate by lights criteria and was deemed to be secondary to overall volume status.  Symptoms did improve after thoracentesis and additional dialysis.  She was discharged home.  She presented again to [facility] emergency department 8/12 (Monday before dialysis) with complaints of hip pain and shortness of breath.  Workup in the emergency department included a chest x-ray which demonstrated return of right-sided pleural effusion.  CT scan of the chest confirmed large right pleural effusion and trace left pleural effusion.  After the imaging she did go to dialysis and subsequently has had improvement in her shortness of breath.  PCCM is asked to evaluate on 8/13 for pleural effusion and consideration of thoracentesis.  Pertinent  Medical History   has a past medical history of A-fib [facility]), Acquired hypothyroidism (10/13/2017), Acute diastolic CHF (congestive heart failure) (HCC) (11/20/2019), Acute respiratory failure with hypoxia (HCC) (10/13/2017), Acute systolic heart failure (HCC) (36/64/4034), Anemia, Anemia associated with diabetes mellitus (HCC) (10/13/2017), Anemia of chronic disease (12/10/2017), Blood transfusion, Blood transfusion without reported diagnosis, Breast cancer (HCC) (15 years ago), Cardiomyopathy (HCC)  (07/23/2017), CHF (congestive heart failure) (HCC), Chronic cholecystitis with calculus (01/22/2018), Colon polyp (03/2007), COVID-19 virus infection (11/20/2019), Diabetes mellitus, Dyspnea, ESRD (end stage renal disease) on dialysis [facility]), Essential hypertension (09/11/2018), GERD (gastroesophageal reflux disease), Heart murmur, Hernia, incisional, Hyperlipidemia, Hypertension, Hypothyroidism, Iron deficiency (07/23/2017), Near syncope (04/01/2018), Neuropathy, Osteoarthritis, hip, bilateral, PONV (postoperative nausea and vomiting), Refractory anemia, unspecified (HCC) (12/10/2017), Short-term memory loss, Type 1 diabetes (HCC) (07/23/2017), Vertigo (06/24/2018), and Vitamin D deficiency.   Significant Hospital Events: Including procedures, antibiotic start and stop dates in addition to other pertinent events   8/12 admit with SOB  Interim History / Subjective:    Objective   Blood pressure (!) 108/44, pulse 77, temperature 98.9 F (37.2 C), temperature source Oral, resp. rate 18, height 5' 2.01" (1.575 m), weight 61.5 kg, SpO2 98%.       No intake or output data in the 24 hours ending 06/12/23 1219 Filed Weights   06/11/23 1952 06/12/23 0218  Weight: 61.5 kg 61.5 kg    Examination: General: Thin elderly appearing female in NAD HENT: [place]/AT, PERRL, no significant JVD Lungs: Clear bilateral breath sound Cardiovascular: RRR, no MRG Abdomen: Soft, NT, ND Extremities: No acute deformity or edema Neuro: Alert, oriented, non-focal   Resolved Hospital Problem list     Assessment & Plan:   Pleural effusion on the right: recurrent effusion. Thora done by Dr. Katrinka Blazing back in July, which was a transudate. Suspect the effusion today is the same secondary to volume overload. Her symptoms have resolved with HD. Satting 97% on room air. Of note imaging was done prior to HD. She does have leukocytosis but no fever or pulmonary infectious s/s otherwise.  - Thoracentesis of little benefit for  diagnostic or therapeutic purposes -  Ongoing volume management with HD per nephrology - Should she develop SOB/hypoxia we would consider therapeutic tap  ESRD on HD - per primary/nephro  Atrial fib - continue eliquis  Patient and family updated at bedside in the ED  Best Practice (right click and "Reselect all SmartList Selections" daily)   Per primary  Labs   CBC: Recent Labs  Lab 06/11/23 1018 06/12/23 0241  WBC 11.1* 22.4*  NEUTROABS 9.5*  --   HGB 11.7* 11.2*  HCT 39.1 36.9  MCV 98.5 96.6  PLT 310 334    Basic Metabolic Panel: Recent Labs  Lab 06/11/23 1210 06/12/23 0241  NA 133* 134*  K 5.3* 4.4  CL 95* 91*  CO2 27 31  GLUCOSE 174* 106*  BUN 77* 25*  CREATININE 5.20* 2.91*  CALCIUM 9.2 9.1  MG 2.2  --    GFR: Estimated Creatinine Clearance: 13.1 mL/min (A) (by C-G formula based on SCr of 2.91 mg/dL (H)). Recent Labs  Lab 06/11/23 1018 06/12/23 0241  WBC 11.1* 22.4*    Liver Function Tests: Recent Labs  Lab 06/11/23 1210  AST 66*  ALT 38  ALKPHOS 112  BILITOT 1.0  PROT 6.7  ALBUMIN 3.4*   No results for input(s): "LIPASE", "AMYLASE" in the last 168 hours. No results for input(s): "AMMONIA" in the last 168 hours.  ABG    Component Value Date/Time   TCO2 31 05/31/2023 0623     Coagulation Profile: No results for input(s): "INR", "PROTIME" in the last 168 hours.  Cardiac Enzymes: No results for input(s): "CKTOTAL", "CKMB", "CKMBINDEX", "TROPONINI" in the last 168 hours.  HbA1C: Hgb A1c MFr Bld  Date/Time Value Ref Range Status  05/16/2023 06:56 PM 5.2 4.8 - 5.6 % Final    Comment:    (NOTE)         Prediabetes: 5.7 - 6.4         Diabetes: >6.4         Glycemic control for adults with diabetes: <7.0   11/20/2019 10:38 AM 9.0 (H) 4.8 - 5.6 % Final    Comment:    (NOTE) Pre diabetes:          5.7%-6.4% Diabetes:              >6.4% Glycemic control for   <7.0% adults with diabetes     CBG: No results for input(s):  "GLUCAP" in the last 168 hours.  Review of Systems:   Bolds are positive  Constitutional: weight loss, gain, night sweats, Fevers, chills, fatigue .  HEENT: headaches, Sore throat, sneezing, nasal congestion, post nasal drip, Difficulty swallowing, Tooth/dental problems, visual complaints visual changes, ear ache CV:  chest pain, radiates:,Orthopnea, PND, swelling in lower extremities, dizziness, palpitations, syncope.  GI  heartburn, indigestion, abdominal pain, nausea, vomiting, diarrhea, change in bowel habits, loss of appetite, bloody stools.  Resp: cough, productive: , hemoptysis, dyspnea, chest pain, pleuritic.  Skin: rash or itching or icterus GU: dysuria, change in color of urine, urgency or frequency. flank pain, hematuria  MS: low back pain, joint pain or swelling. decreased range of motion  Psych: change in mood or affect. depression or anxiety.  Neuro: difficulty with speech, weakness, numbness, ataxia    Past Medical History:  She,  has a past medical history of A-fib (HCC), Acquired hypothyroidism (10/13/2017), Acute diastolic CHF (congestive heart failure) (HCC) (11/20/2019), Acute respiratory failure with hypoxia (HCC) (10/13/2017), Acute systolic heart failure (HCC) (16/07/9603), Anemia, Anemia associated with diabetes mellitus (HCC) (10/13/2017), Anemia  of chronic disease (12/10/2017), Blood transfusion, Blood transfusion without reported diagnosis, Breast cancer (HCC) (15 years ago), Cardiomyopathy (HCC) (07/23/2017), CHF (congestive heart failure) (HCC), Chronic cholecystitis with calculus (01/22/2018), Colon polyp (03/2007), COVID-19 virus infection (11/20/2019), Diabetes mellitus, Dyspnea, ESRD (end stage renal disease) on dialysis [facility]), Essential hypertension (09/11/2018), GERD (gastroesophageal reflux disease), Heart murmur, Hernia, incisional, Hyperlipidemia, Hypertension, Hypothyroidism, Iron deficiency (07/23/2017), Near syncope (04/01/2018), Neuropathy, Osteoarthritis,  hip, bilateral, PONV (postoperative nausea and vomiting), Refractory anemia, unspecified (HCC) (12/10/2017), Short-term memory loss, Type 1 diabetes (HCC) (07/23/2017), Vertigo (06/24/2018), and Vitamin D deficiency.   Surgical History:   Past Surgical History:  Procedure Laterality Date   A/V FISTULAGRAM N/A 05/16/2023   Procedure: A/V Fistulagram;  Surgeon: Victorino Sparrow, MD;  Location: [facility] INVASIVE CV LAB;  Service: Cardiovascular;  Laterality: N/A;   ABDOMINAL HYSTERECTOMY     APPENDECTOMY     BREAST SURGERY     CAPD REMOVAL N/A 05/31/2023   Procedure: REMOVAL CONTINUOUS AMBULATORY PERITONEAL DIALYSIS  (CAPD) CATHETER;  Surgeon: [name], MD;  Location: [facility] OR;  Service: Vascular;  Laterality: N/A;   CHOLECYSTECTOMY  01/22/2018   LAPROSCOPIC    CHOLECYSTECTOMY N/A 01/22/2018   Procedure: LAPAROSCOPIC CHOLECYSTECTOMY WITH INTRAOPERATIVE CHOLANGIOGRAM;  Surgeon: Manus Rudd, MD;  Location: [facility] OR;  Service: General;  Laterality: N/A;   COLONOSCOPY  2013   due next 03-2017   EYE SURGERY     bil cataracts   IUD REMOVAL     with appendectomy   MASTECTOMY, MODIFIED RADICAL W/RECONSTRUCTION Left 1998   10 nodes out   PERIPHERAL VASCULAR INTERVENTION  05/16/2023   Procedure: PERIPHERAL VASCULAR INTERVENTION;  Surgeon: Victorino Sparrow, MD;  Location: [facility] INVASIVE CV LAB;  Service: Cardiovascular;;   THORACENTESIS Right 05/17/2023   Procedure: THORACENTESIS;  Surgeon: Lorin Glass, MD;  Location: [facility] ENDOSCOPY;  Service: Pulmonary;  Laterality: Right;   TUMOR EXCISION Left    x 2, neck, head     Social History:   reports that she has never smoked. She has never used smokeless tobacco. She reports that she does not drink alcohol and does not use drugs.   Family History:  Her family history includes Congestive Heart Failure in her father; Diabetes in an other family member; Heart attack in her brother; Hypertension in her father, maternal grandfather, and maternal grandmother;  Peripheral vascular disease in her father; Stomach cancer in her maternal grandmother. There is no history of Colon cancer, Esophageal cancer, Pancreatic cancer, or Rectal cancer.   Allergies Allergies  Allergen Reactions   Erythromycin Shortness Of Breath and Swelling    "throat swelling", sob, thrashing ,    Penicillins Rash   Tape Other (See Comments)    Patient states that she prefers paper tape because other tapes tear skin.   Doxylamine-Dm Rash   Nyquil [Pseudoeph-Doxylamine-Dm-Apap] Rash   Sulfa Antibiotics Rash     Home Medications  Prior to Admission medications   Medication Sig Start Date End Date Taking? Authorizing Provider  apixaban (ELIQUIS) 2.5 MG TABS tablet Take 1 tablet (2.5 mg total) by mouth 2 (two) times daily. 05/06/23  Yes Bender, Irving Burton, DO  cloNIDine (CATAPRES) 0.1 MG tablet Take 1 tablet (0.1 mg total) by mouth daily as needed (FOR SYSTOLIC BLOOD PRESSURE GREATER THAN 170 AND OR DIASTOLIC FOR BLOOD PRESSURE GREATER 100). 05/18/23  Yes Bender, Emily, DO  HUMALOG 100 UNIT/ML injection Inject 50-100 Units into the skin See admin instructions. Pump 02/12/20  Yes [provider]  Insulin  Disposable Pump (OMNIPOD DASH 5 PACK PODS) MISC SMARTSIG:SUB-Q Every 3 Days 05/11/20  Yes [provider]  insulin glargine (LANTUS SOLOSTAR) 100 UNIT/ML Solostar Pen Inject 1-100 Units into the skin in the morning. Unknown Sliding scale 01/26/20  Yes [provider]  levothyroxine (SYNTHROID) 88 MCG tablet Take 1 tablet (88 mcg total) by mouth daily at 6 (six) AM. 05/07/23 06/11/23 Yes Morene Crocker, MD  lidocaine-prilocaine (EMLA) cream Apply 1 Application topically once. 04/07/23  Yes [provider]  metoprolol tartrate (LOPRESSOR) 25 MG tablet Take 12.5 mg by mouth daily as needed (IF HR more than 100 and syst BP more than 160 per daughter).   Yes [provider]  nystatin (MYCOSTATIN/NYSTOP) powder Apply topically 2 (two) times  daily. Patient taking differently: Apply 1 Application topically 2 (two) times daily. 05/18/23  Yes Faith Rogue, DO  oxyCODONE-acetaminophen (PERCOCET) 5-325 MG tablet Take 1 tablet by mouth every 6 (six) hours as needed for severe pain. 05/31/23 05/30/24 Yes Baglia, Corrina, PA-C  Continuous Blood Gluc Sensor (FREESTYLE LIBRE 14 DAY SENSOR) MISC 1 each by Other route See admin instructions. 10/22/19   [provider]  Insulin Syringe-Needle U-100 (ULTICARE INSULIN SYRINGE) 30G X 1/2" 0.5 ML MISC Please use as previously directed Patient taking differently: 1 each by Other route See admin instructions. Please use as previously directed 05/18/23   Faith Rogue, DO  ONETOUCH VERIO test strip 1 each as needed for other (Glucose). 08/24/17   [provider]      Joneen Roach, AGACNP-BC [facility]  See Amion for personal pager PCCM on call pager [phone] until 7pm. Please call Elink 7p-7a. [phone]  06/12/2023 1:16 PM

## 2023-06-13 DIAGNOSIS — E8770 Fluid overload, unspecified: Secondary | ICD-10-CM | POA: Diagnosis not present

## 2023-06-13 DIAGNOSIS — K5900 Constipation, unspecified: Secondary | ICD-10-CM | POA: Diagnosis not present

## 2023-06-13 DIAGNOSIS — N186 End stage renal disease: Secondary | ICD-10-CM | POA: Diagnosis not present

## 2023-06-13 LAB — CBC
HCT: 35.4 % — ABNORMAL LOW (ref 36.0–46.0)
HCT: 34.1 % — ABNORMAL LOW (ref 36.0–46.0)
Hemoglobin: 10.9 g/dL — ABNORMAL LOW (ref 12.0–15.0)
Hemoglobin: 10.6 g/dL — ABNORMAL LOW (ref 12.0–15.0)
MCH: 29.5 pg (ref 26.0–34.0)
MCH: 30.4 pg (ref 26.0–34.0)
MCHC: 30.8 g/dL (ref 30.0–36.0)
MCHC: 31.1 g/dL (ref 30.0–36.0)
MCV: 95.7 fL (ref 80.0–100.0)
MCV: 97.7 fL (ref 80.0–100.0)
Platelets: 318 10*3/uL (ref 150–400)
Platelets: 323 10*3/uL (ref 150–400)
RBC: 3.7 MIL/uL — ABNORMAL LOW (ref 3.87–5.11)
RBC: 3.49 MIL/uL — ABNORMAL LOW (ref 3.87–5.11)
RDW: 16.4 % — ABNORMAL HIGH (ref 11.5–15.5)
RDW: 16.5 % — ABNORMAL HIGH (ref 11.5–15.5)
WBC: 13.6 10*3/uL — ABNORMAL HIGH (ref 4.0–10.5)
WBC: 13.2 10*3/uL — ABNORMAL HIGH (ref 4.0–10.5)
nRBC: 0 % (ref 0.0–0.2)
nRBC: 0 % (ref 0.0–0.2)

## 2023-06-13 LAB — RENAL FUNCTION PANEL
Albumin: 3 g/dL — ABNORMAL LOW (ref 3.5–5.0)
Albumin: 2.9 g/dL — ABNORMAL LOW (ref 3.5–5.0)
Anion gap: 11 (ref 5–15)
Anion gap: 14 (ref 5–15)
BUN: 46 mg/dL — ABNORMAL HIGH (ref 8–23)
BUN: 48 mg/dL — ABNORMAL HIGH (ref 8–23)
CO2: 31 mmol/L (ref 22–32)
CO2: 28 mmol/L (ref 22–32)
Calcium: 8.8 mg/dL — ABNORMAL LOW (ref 8.9–10.3)
Calcium: 8.8 mg/dL — ABNORMAL LOW (ref 8.9–10.3)
Chloride: 90 mmol/L — ABNORMAL LOW (ref 98–111)
Chloride: 92 mmol/L — ABNORMAL LOW (ref 98–111)
Creatinine, Ser: 4.82 mg/dL — ABNORMAL HIGH (ref 0.44–1.00)
Creatinine, Ser: 4.93 mg/dL — ABNORMAL HIGH (ref 0.44–1.00)
GFR, Estimated: 9 mL/min — ABNORMAL LOW (ref >60)
GFR, Estimated: 8 mL/min — ABNORMAL LOW (ref >60)
Glucose, Bld: 96 mg/dL (ref 70–99)
Glucose, Bld: 121 mg/dL — ABNORMAL HIGH (ref 70–99)
Phosphorus: 2.7 mg/dL (ref 2.5–4.6)
Phosphorus: 2.6 mg/dL (ref 2.5–4.6)
Potassium: 4.9 mmol/L (ref 3.5–5.1)
Potassium: 4.2 mmol/L (ref 3.5–5.1)
Sodium: 132 mmol/L — ABNORMAL LOW (ref 135–145)
Sodium: 134 mmol/L — ABNORMAL LOW (ref 135–145)

## 2023-06-13 LAB — GLUCOSE, CAPILLARY
Glucose-Capillary: 107 mg/dL — ABNORMAL HIGH (ref 70–99)
Glucose-Capillary: 148 mg/dL — ABNORMAL HIGH (ref 70–99)
Glucose-Capillary: 142 mg/dL — ABNORMAL HIGH (ref 70–99)

## 2023-06-13 MED ORDER — HEPARIN SODIUM (PORCINE) 1000 UNIT/ML DIALYSIS: 1000.0000 [IU] | INTRAMUSCULAR | Status: DC | PRN

## 2023-06-13 MED ORDER — LIDOCAINE HCL (PF) 1 % IJ SOLN: 5.0000 mL | INTRAMUSCULAR | Status: DC | PRN

## 2023-06-13 MED ORDER — DOCUSATE SODIUM 50 MG/5ML PO LIQD: 50.0000 mg | Freq: Every day | ORAL | Status: DC

## 2023-06-13 MED ORDER — TRIMETHOBENZAMIDE HCL 100 MG/ML IM SOLN
200.0000 mg | Freq: Once | INTRAMUSCULAR | Status: AC
  Administered 2023-06-13: 200 mg via INTRAMUSCULAR
  Filled 2023-06-13: qty 2

## 2023-06-13 MED ORDER — ACETAMINOPHEN 325 MG PO TABS: 650.0000 mg | ORAL_TABLET | Freq: Four times a day (QID) | ORAL | Status: DC | PRN

## 2023-06-13 MED ORDER — MIDODRINE HCL 5 MG PO TABS: 5.0000 mg | ORAL_TABLET | Freq: Once | ORAL | Status: DC | PRN

## 2023-06-13 MED ORDER — PENTAFLUOROPROP-TETRAFLUOROETH EX AERO: 1.0000 | INHALATION_SPRAY | CUTANEOUS | Status: DC | PRN

## 2023-06-13 MED ORDER — MIDODRINE HCL 5 MG PO TABS
5.0000 mg | ORAL_TABLET | Freq: Every day | ORAL | Status: DC | PRN
  Administered 2023-06-13: 5 mg via ORAL
  Filled 2023-06-13: qty 1

## 2023-06-13 MED ORDER — ALTEPLASE 2 MG IJ SOLR: 2.0000 mg | Freq: Once | INTRAMUSCULAR | Status: DC | PRN

## 2023-06-13 MED ORDER — LIDOCAINE 5 % EX PTCH
1.0000 | MEDICATED_PATCH | CUTANEOUS | Status: DC
  Filled 2023-06-13 (×2): qty 1

## 2023-06-13 MED ORDER — ANTICOAGULANT SODIUM CITRATE 4% (200MG/5ML) IV SOLN: 5.0000 mL | Status: DC | PRN

## 2023-06-13 NOTE — Progress Notes (Signed)
Pt receives out-pt HD at [facility] on MWF. Nephrologist is inquiring if pt can receive 4x's a week tx at d/c. Contacted clinic and staff unavailable who can make that decision. Will f/u with clinic tomorrow when staff available. Nephrologist provided an update.   Olivia Canter Renal Navigator [phone]

## 2023-06-13 NOTE — Inpatient Diabetes Management (Signed)
Inpatient Diabetes Program Recommendations  AACE/ADA: New Consensus Statement on Inpatient Glycemic Control (2015)  Target Ranges:  Prepandial:   less than 140 mg/dL      Peak postprandial:   less than 180 mg/dL (1-2 hours)      Critically ill patients:  140 - 180 mg/dL   Lab Results  Component Value Date   GLUCAP 107 (H) 06/13/2023   HGBA1C 5.2 05/16/2023    Review of Glycemic Control  Diabetes history: DM  Outpatient Diabetes medications:  Omnipod insulin pump(located on back of the right arm new site started on 8/14) with Humalog insulin Pt uses Dexcom (currently located on the back of her right arm)  Insulin pump settings:  Automode with basal insulin delivery between 5-10 units) Target glucose 130 Carbohydrate coverage 1 nit for every 15 grams of carbohydrate Sensitivity 1 unit drops glucose 50 points Insulin on board 4 hours  Current orders for Inpatient glycemic control:  Insulin pump order set  Pt has an appointment to follow up with an Endocrinologist in March 2025  Inpatient Diabetes Program Recommendations:    -   Semglee 5 units Q24 hours -   Novolog 0-6 units tid + hs scale starting at 151 mg/dl -  Novolog 2 units tid meal coverage with parameters (give if pt eats at least 50% of meals and glucose is at least 80 mg/dl)  Spoke with pt and granddaughter at bedside. Granddaughter called daughter and we spoke about the insulin pump and future plans regarding pt being placed in an ALF and not being able to continue the operation of her insulin pump. Daughter also informed me that the family operates the insulin pump and pt does not know how to use. Daughter expressed frustration with pt not being able to stay o insulin pump and the fact that Endocrinology follow up is in March of next year. Family is very active in pt care and would be okay for the time being in managing glucose trends alongside of a PCP until Endocrinology follow up.  Daughter would like to start pt  o SQ regimen while inpatient especially since pt is on Dialysis and this can greatly effect how SQ insulin is dosed based on pt needs and glucose trends.  Messaged Internal Resident team and discussed plan of care of pt regarding Glucose control and management outpatient. Team to follow up and assess needs.  Thanks,  Christena Deem RN, MSN, BC-ADM Inpatient Diabetes Coordinator Team Pager [phone] (8a-5p)

## 2023-06-13 NOTE — Progress Notes (Signed)
Received patient in bed to unit.  Alert and oriented.  Informed consent signed and in chart.   TX duration:  Patient tolerated well.  Transported back to the room  Alert, without acute distress.  Hand-off given to patient's nurse.   Access used: R FA Access issues: none  Total UF removed: Medication(s) given: midodrine, see MAR   06/13/23 1259  Vitals  Temp 97.6 F (36.4 C)  Temp Source Oral  BP (!) 99/45  MAP (mmHg) (!) 62  BP Location Left Arm  BP Method Automatic  Patient Position (if appropriate) Lying  Pulse Rate 84  Pulse Rate Source Monitor  ECG Heart Rate 85  Resp (!) 22  MEWS COLOR  MEWS Score Color Yellow  Oxygen Therapy  SpO2 99 %  O2 Device Room Air  MEWS Score  MEWS Temp 0  MEWS Systolic 1  MEWS Pulse 0  MEWS RR 1  MEWS LOC 0  MEWS Score 2     Stacie Glaze LPN [facility]

## 2023-06-13 NOTE — Plan of Care (Signed)
Patient is Elizabeth Olsen. Granddaughter at bedside. Education provided regarding carb counting and use of menu to calculate. Patient is sitting on chair after HD treatment. No pain. VSS. RA.   Problem: Education: Goal: Knowledge of General Education information will improve Description: Including pain rating scale, medication(s)/side effects and non-pharmacologic comfort measures Outcome: Progressing   Problem: Health Behavior/Discharge Planning: Goal: Ability to manage health-related needs will improve Outcome: Progressing   Problem: Clinical Measurements: Goal: Ability to maintain clinical measurements within normal limits will improve Outcome: Progressing Goal: Will remain free from infection Outcome: Progressing Goal: Diagnostic test results will improve Outcome: Progressing Goal: Respiratory complications will improve Outcome: Progressing Goal: Cardiovascular complication will be avoided Outcome: Progressing   Problem: Activity: Goal: Risk for activity intolerance will decrease Outcome: Progressing   Problem: Nutrition: Goal: Adequate nutrition will be maintained Outcome: Progressing   Problem: Coping: Goal: Level of anxiety will decrease Outcome: Progressing   Problem: Elimination: Goal: Will not experience complications related to bowel motility Outcome: Progressing Goal: Will not experience complications related to urinary retention Outcome: Progressing   Problem: Pain Managment: Goal: General experience of comfort will improve Outcome: Progressing   Problem: Safety: Goal: Ability to remain free from injury will improve Outcome: Progressing   Problem: Skin Integrity: Goal: Risk for impaired skin integrity will decrease Outcome: Progressing

## 2023-06-13 NOTE — Progress Notes (Signed)
HD#2 Subjective:  Overnight Events: Prescribed Tigan for nausea.  Hip pain is much improved with Tylenol. Due for HD today.  Objective:  Vital signs in last 24 hours: Vitals:   06/13/23 1230 06/13/23 1259 06/13/23 1307 06/13/23 1309  BP: (!) 97/38 (!) 99/45  (!) 118/52  Pulse: 81 84  82  Resp: 19 (!) 22  (!) 31  Temp:  97.6 F (36.4 C)    TempSrc:  Oral    SpO2: 98% 99%  100%  Weight:   55.7 kg   Height:       Supplemental O2: Room Air SpO2: 100 %  Physical Exam:   General: Pleasant, not in acute distress. CV: RRR. No m/r/g. No LE edema Pulmonary: Lungs are clear bilaterally.   Abdominal: Soft, nontender, nondistended. Normal bowel sounds. Extremities:  Skin: Warm and dry. No obvious rash or lesions.  Filed Weights   06/13/23 0500 06/13/23 0845 06/13/23 1307  Weight: 61.5 kg 58.5 kg 55.7 kg     Intake/Output Summary (Last 24 hours) at 06/13/2023 1323 Last data filed at 06/13/2023 1312 Gross per 24 hour  Intake 120 ml  Output 3000 ml  Net -2880 ml   Net IO Since Admission: -2,880 mL [06/13/23 1323]  Recent Labs    06/12/23 1714 06/12/23 2106 06/13/23 0759  GLUCAP 150* 309* 107*     Pertinent Labs:    Latest Ref Rng & Units 06/13/2023    8:19 AM 06/13/2023    7:18 AM 06/12/2023    2:41 AM  CBC  WBC 4.0 - 10.5 K/uL 13.2  13.6  22.4   Hemoglobin 12.0 - 15.0 g/dL 16.1  09.6  04.5   Hematocrit 36.0 - 46.0 % 34.1  35.4  36.9   Platelets 150 - 400 K/uL 323  318  334        Latest Ref Rng & Units 06/13/2023    9:18 AM 06/13/2023    7:18 AM 06/12/2023    2:41 AM  CMP  Glucose 70 - 99 mg/dL 409  96  811   BUN 8 - 23 mg/dL 48  46  25   Creatinine 0.44 - 1.00 mg/dL 9.14  7.82  9.56   Sodium 135 - 145 mmol/L 134  132  134   Potassium 3.5 - 5.1 mmol/L 4.2  4.9  4.4   Chloride 98 - 111 mmol/L 92  90  91   CO2 22 - 32 mmol/L 28  31  31    Calcium 8.9 - 10.3 mg/dL 8.8  8.8  9.1     Imaging: No results found.  Assessment/Plan:   Principal Problem:    Acute exacerbation of CHF (congestive heart failure) (HCC) Active Problems:   ESRD (end stage renal disease) (HCC)   Hypervolemia   Type 2 diabetes mellitus with chronic kidney disease on chronic dialysis, without long-term current use of insulin (HCC)   Left hip pain   Constipation   Patient Summary:  Elizabeth Olsen is a 82 y.o. person living with a history of ESRD, CHF, breast cancer who presented with left hip pain and admitted for multifactorial volume overloaded state .  #Acute HF Exacerbation  ESRD HTN Patient's vitals stable, SOB has improved post dialysis.  She has had recurrent pleural effusion requiring thoracentesis.  PCCM was consulted, but and they are not inclined to do thoracentesis this time round given she is not symptomatic, or hypoxic.    -Will continue clonidine PRN and metoprolol to get  better control of BP  -Continue clonidine and metoprolol, consider adding hydralazine if BP still uncontrolled.  -PT/OT  # ERSD Pt receives out-pt HD at [facility] on MWF. Nephrologist is inquiring if pt can receive 4x's a week tx at d/c  -Follow-up outpatient HD plan.  # Constipation No bowel movement yet.   -Continue senna twice daily. -Start liquid docusate.   #Chronic Problems  Afib: Continuing eliquis 2.5mg  BID T1DM: On omni pod, can consider using SSI Hypothyroidism: Continuing levothyroixine Anemia: Hb stable at 11.7    Diet: Renal diet IVF: PO intake VTE: apixaban (ELIQUIS) tablet 2.5 mg Start: 06/11/23 2200 SCDs Start: 06/11/23 1553apixaban (ELIQUIS) tablet 2.5 mg  Code: Full PT/OT: Pending ID:  Anti-infectives (From admission, onward)    None       Anticipated discharge to Home   Laretta Bolster, M.D.  Internal Medicine Resident, PGY-1 [facility]  Pager: [phone] 1:23 PM, 06/13/2023   **Please contact the on call pager after 5 pm and on weekends at [phone].**

## 2023-06-13 NOTE — Progress Notes (Signed)
Mobility Specialist: Progress Note   06/13/23 1508  Mobility  Activity Ambulated with assistance in hallway  Level of Assistance Contact guard assist, steadying assist  Assistive Device Front wheel walker  Distance Ambulated (ft) 40 ft  Activity Response Tolerated well  Mobility Referral Yes  $Mobility charge 1 Mobility  Mobility Specialist Start Time (ACUTE ONLY) 1450  Mobility Specialist Stop Time (ACUTE ONLY) 1506  Mobility Specialist Time Calculation (min) (ACUTE ONLY) 16 min   Pre-Mobility: HR 92, BP 100/47 (63) Post-Mobility: HR 102, BP 123/52 (71)  Pt was agreeable to mobility session - received in chair. Had c/o weakness and neck pain. Given verbal cues to straighten posture while ambulating. Ambulation limited d/t pain and fatigue, c/o SOB upon return to room. Left in bed with all needs met, call bell in reach. Family in room.   Maurene Capes Mobility Specialist Please contact via SecureChat or Rehab office at [phone]

## 2023-06-13 NOTE — Progress Notes (Signed)
This chaplain responded to RN request for Pt. prayer. The chaplain understands the Pt. spent most of the day in HD. The Pt. shares she is tired and prefers to rest.  The Pt. granddaughter-Layla introduced herself. The chaplain provided education to [facility] about the role of a spiritual care provider on the Palliative Care team. The Pt. and family extended the invitation for the chaplain to revisit on Thursday.  Chaplain Stephanie Acre [phone]

## 2023-06-13 NOTE — Progress Notes (Signed)
Nephrology Follow-Up Consult note   Assessment/Recommendations: Elizabeth Olsen is a/an 82 y.o. female with a past medical history significant for ESRD, admitted for shortness of breath and volume overlaod       Dialysis Orders:  MWF - [facility] 4hrs, BFR 400, DFR 800,  EDW 58kg, 3K/ 2.5Ca No heparin Mircera 200 mcg q2wks - last 06/06/23   Last Labs: Hgb 11.7, K 5.3, Ca 9.2, Alb 3.4   Assessment/Plan: Shortness of breath: Mostly related to volume excess.  Pulmonology did not feel thoracentesis was warranted.  Continue with volume optimization on dialysis.  Much better Left hip pain - managed by primary ESRD - on HD MWF.  Maintain schedule Hypertension/volume  -blood pressure up-and-down.  Blood pressure improved with volume optimization.  Some hypotension.  Not using scheduled midodrine at this time given she is asymptomatic.  We are providing some on dialysis to help with ultrafiltration Anemia of CKD - Hgb ~11. No indication for Fe or ESA at this time. Secondary Hyperparathyroidism -calcium and phosphorus at goal Nutrition -can liberalize diet. Limit fluids Mastitis - appreciated on CT. Plan per primary Recurrent pleural effusion -pulmonology to not feel thoracentesis is helpful.  Appreciate insight GOC: Palliative care following.  Appreciate help   Recommendations conveyed to primary service.    Darnell [facility] 06/13/2023 2:50 PM  ___________________________________________________________  CC: Shortness of breath  Interval History/Subjective: Patient feels well today with no complaints.  Some hypotension that we have monitor closel.  Mostly asymptomatic   Medications:  Current Facility-Administered Medications  Medication Dose Route Frequency Provider Last Rate Last Admin   acetaminophen (TYLENOL) tablet 1,000 mg  1,000 mg Oral Q6H PRN Nooruddin, Saad, MD   1,000 mg at 06/13/23 6237   acetaminophen (TYLENOL) tablet 650 mg   650 mg Oral Q6H PRN Laretta Bolster, MD       apixaban Everlene Balls) tablet 2.5 mg  2.5 mg Oral BID Nooruddin, Saad, MD   2.5 mg at 06/12/23 2138   camphor-menthol (SARNA) lotion   Topical PRN Morrie Sheldon, MD       Chlorhexidine Gluconate Cloth 2 % PADS 6 each  6 each Topical Q0600 Berenda Morale, NP   6 each at 06/13/23 6283   cloNIDine (CATAPRES) tablet 0.1 mg  0.1 mg Oral Daily PRN Nooruddin, Jason Fila, MD       docusate (COLACE) 50 MG/5ML liquid 50 mg  50 mg Oral Daily Ginnie Smart, MD       docusate sodium (COLACE) capsule 100 mg  100 mg Oral Daily Nooruddin, Saad, MD   100 mg at 06/12/23 1029   levothyroxine (SYNTHROID) tablet 88 mcg  88 mcg Oral Q0600 Nooruddin, Jason Fila, MD   88 mcg at 06/13/23 0704   lidocaine (LIDODERM) 5 % 1 patch  1 patch Transdermal Q24H Nooruddin, Saad, MD       metoprolol tartrate (LOPRESSOR) tablet 12.5 mg  12.5 mg Oral Daily PRN Nooruddin, Saad, MD       midodrine (PROAMATINE) tablet 5 mg  5 mg Oral Daily PRN Darnell Level, MD   5 mg at 06/13/23 1146   ondansetron (ZOFRAN) injection 4 mg  4 mg Intravenous Once Ali, Amjad, PA-C       senna (SENOKOT) tablet 17.2 mg  2 tablet Oral Daily Nooruddin, Saad, MD   17.2 mg at 06/12/23 1029   sodium zirconium cyclosilicate (LOKELMA) packet 10 g  10 g Oral Once [name], MD  Review of Systems: 10 systems reviewed and negative except per interval history/subjective  Physical Exam: Vitals:   06/13/23 1309 06/13/23 1437  BP: (!) 118/52 (!) 116/90  Pulse: 82 96  Resp: (!) 31 18  Temp:  97.8 F (36.6 C)  SpO2: 100% 98%   Total I/O In: -  Out: 3000 [Other:3000]  Intake/Output Summary (Last 24 hours) at 06/13/2023 1450 Last data filed at 06/13/2023 1312 Gross per 24 hour  Intake 120 ml  Output 3000 ml  Net -2880 ml   Constitutional: Chronically ill appearing, no acute distress ENMT: ears and nose without scars or lesions, MMM CV: normal rate, no edema Respiratory: Bilateral chest rise,  decreased breath sounds bilaterally,, normal work of breathing Gastrointestinal: soft, non-tender, no palpable masses or hernias Skin: no visible lesions or rashes Psych: alert, judgement/insight impaired, appropriate mood and affect   Test Results I personally reviewed new and old clinical labs and radiology tests Lab Results  Component Value Date   NA 134 (L) 06/13/2023   K 4.2 06/13/2023   CL 92 (L) 06/13/2023   CO2 28 06/13/2023   BUN 48 (H) 06/13/2023   CREATININE 4.93 (H) 06/13/2023   CALCIUM 8.8 (L) 06/13/2023   ALBUMIN 2.9 (L) 06/13/2023   PHOS 2.6 06/13/2023    CBC Recent Labs  Lab 06/11/23 1018 06/12/23 0241 06/13/23 0718 06/13/23 0819  WBC 11.1* 22.4* 13.6* 13.2*  NEUTROABS 9.5*  --   --   --   HGB 11.7* 11.2* 10.9* 10.6*  HCT 39.1 36.9 35.4* 34.1*  MCV 98.5 96.6 95.7 97.7  PLT 310 [phone]

## 2023-06-14 ENCOUNTER — Encounter: Payer: Self-pay | Admitting: Hematology

## 2023-06-14 ENCOUNTER — Other Ambulatory Visit (HOSPITAL_COMMUNITY): Payer: Self-pay

## 2023-06-14 DIAGNOSIS — N186 End stage renal disease: Secondary | ICD-10-CM | POA: Diagnosis not present

## 2023-06-14 DIAGNOSIS — K5900 Constipation, unspecified: Secondary | ICD-10-CM | POA: Diagnosis not present

## 2023-06-14 DIAGNOSIS — E8770 Fluid overload, unspecified: Secondary | ICD-10-CM | POA: Diagnosis not present

## 2023-06-14 LAB — BASIC METABOLIC PANEL
Anion gap: 14 (ref 5–15)
BUN: 27 mg/dL — ABNORMAL HIGH (ref 8–23)
CO2: 29 mmol/L (ref 22–32)
Calcium: 9.3 mg/dL (ref 8.9–10.3)
Chloride: 91 mmol/L — ABNORMAL LOW (ref 98–111)
Creatinine, Ser: 3.53 mg/dL — ABNORMAL HIGH (ref 0.44–1.00)
GFR, Estimated: 12 mL/min — ABNORMAL LOW (ref >60)
Glucose, Bld: 163 mg/dL — ABNORMAL HIGH (ref 70–99)
Potassium: 4.2 mmol/L (ref 3.5–5.1)
Sodium: 134 mmol/L — ABNORMAL LOW (ref 135–145)

## 2023-06-14 LAB — CBC WITH DIFFERENTIAL/PLATELET
Abs Immature Granulocytes: 0.07 10*3/uL (ref 0.00–0.07)
Basophils Absolute: 0 10*3/uL (ref 0.0–0.1)
Basophils Relative: 0 %
Eosinophils Absolute: 0.1 10*3/uL (ref 0.0–0.5)
Eosinophils Relative: 1 %
HCT: 39.5 % (ref 36.0–46.0)
Hemoglobin: 11.9 g/dL — ABNORMAL LOW (ref 12.0–15.0)
Immature Granulocytes: 1 %
Lymphocytes Relative: 10 %
Lymphs Abs: 1.1 10*3/uL (ref 0.7–4.0)
MCH: 29.6 pg (ref 26.0–34.0)
MCHC: 30.1 g/dL (ref 30.0–36.0)
MCV: 98.3 fL (ref 80.0–100.0)
Monocytes Absolute: 1.8 10*3/uL — ABNORMAL HIGH (ref 0.1–1.0)
Monocytes Relative: 17 %
Neutro Abs: 7.4 10*3/uL (ref 1.7–7.7)
Neutrophils Relative %: 71 %
Platelets: 348 10*3/uL (ref 150–400)
RBC: 4.02 MIL/uL (ref 3.87–5.11)
RDW: 16.3 % — ABNORMAL HIGH (ref 11.5–15.5)
WBC: 10.5 10*3/uL (ref 4.0–10.5)
nRBC: 0 % (ref 0.0–0.2)

## 2023-06-14 LAB — GLUCOSE, CAPILLARY: Glucose-Capillary: 151 mg/dL — ABNORMAL HIGH (ref 70–99)

## 2023-06-14 MED ORDER — DOCUSATE SODIUM 50 MG/5ML PO LIQD
50.0000 mg | Freq: Two times a day (BID) | ORAL | Status: DC
  Administered 2023-06-14: 50 mg via ORAL
  Filled 2023-06-14: qty 10

## 2023-06-14 MED ORDER — ONDANSETRON HCL 4 MG PO TABS
4.0000 mg | ORAL_TABLET | Freq: Three times a day (TID) | ORAL | 0 refills | Status: DC | PRN
  Filled 2023-06-14: qty 12, 4d supply, fill #0

## 2023-06-14 MED ORDER — INSULIN PUMP
Freq: Three times a day (TID) | SUBCUTANEOUS | Status: DC
  Filled 2023-06-14: qty 1

## 2023-06-14 MED ORDER — ONDANSETRON HCL 4 MG PO TABS
4.0000 mg | ORAL_TABLET | Freq: Four times a day (QID) | ORAL | 0 refills | Status: DC
  Filled 2023-06-14: qty 12, 3d supply, fill #0

## 2023-06-14 MED ORDER — SENNA 8.6 MG PO TABS
2.0000 | ORAL_TABLET | Freq: Every day | ORAL | 0 refills | Status: DC | PRN
  Filled 2023-06-14: qty 120, 60d supply, fill #0

## 2023-06-14 MED ORDER — DOCUSATE SODIUM 50 MG/5ML PO LIQD
50.0000 mg | Freq: Two times a day (BID) | ORAL | 0 refills | Status: DC | PRN
  Filled 2023-06-14: qty 100, 10d supply, fill #0

## 2023-06-14 NOTE — Care Management Important Message (Signed)
Important Message  Patient Details  Name: DENASHIA DICKE MRN: [id number] Date of Birth: 1941-07-22   Medicare Important Message Given:  Yes     Sherilyn Banker 06/14/2023, 1:07 PM

## 2023-06-14 NOTE — Plan of Care (Addendum)
Patient is Elizabeth Olsen with daughter at bedside. In stable condition. Worked with therapy this AM. Tolerating diet. No pain.   Daughter given all discharge paperwork. Educated patient and daughter. No questions.   Problem: Education: Goal: Knowledge of General Education information will improve Description: Including pain rating scale, medication(s)/side effects and non-pharmacologic comfort measures Outcome: Adequate for Discharge   Problem: Health Behavior/Discharge Planning: Goal: Ability to manage health-related needs will improve Outcome: Adequate for Discharge   Problem: Clinical Measurements: Goal: Ability to maintain clinical measurements within normal limits will improve Outcome: Adequate for Discharge Goal: Will remain free from infection Outcome: Adequate for Discharge Goal: Diagnostic test results will improve Outcome: Adequate for Discharge Goal: Respiratory complications will improve Outcome: Adequate for Discharge Goal: Cardiovascular complication will be avoided Outcome: Adequate for Discharge   Problem: Activity: Goal: Risk for activity intolerance will decrease Outcome: Adequate for Discharge   Problem: Nutrition: Goal: Adequate nutrition will be maintained Outcome: Adequate for Discharge   Problem: Coping: Goal: Level of anxiety will decrease Outcome: Adequate for Discharge   Problem: Elimination: Goal: Will not experience complications related to bowel motility Outcome: Adequate for Discharge Goal: Will not experience complications related to urinary retention Outcome: Adequate for Discharge   Problem: Pain Managment: Goal: General experience of comfort will improve Outcome: Adequate for Discharge   Problem: Safety: Goal: Ability to remain free from injury will improve Outcome: Adequate for Discharge   Problem: Skin Integrity: Goal: Risk for impaired skin integrity will decrease Outcome: Adequate for Discharge

## 2023-06-14 NOTE — Progress Notes (Addendum)
Spoke to Tax adviser at [facility]. Clinic does not have the availability to treat pt 4x's a week. Clinic can only do 3x's a week treatments. Update provided to attending, nephrologist, and renal NP. Will assist as needed.   Olivia Canter Renal Navigator [phone]  Addendum at 11:31 am: D/C order noted. Contacted [facility] to advise clinic of pt's d/c today and that pt should resume care tomorrow.

## 2023-06-14 NOTE — Discharge Summary (Signed)
Name: Elizabeth Olsen MRN: [id number] DOB: 12-Oct-1941 82 y.o. PCP: Morene Crocker, MD  Date of Admission: 06/11/2023  9:15 AM Date of Discharge: 06/14/2023 Attending Physician: Dr.  Vernia Buff  Discharge Diagnosis: Principal Problem:   Acute exacerbation of CHF (congestive heart failure) (HCC) Active Problems:   ESRD (end stage renal disease) (HCC)   Hypervolemia   Type 2 diabetes mellitus with chronic kidney disease on chronic dialysis, without long-term current use of insulin (HCC)   Left hip pain   Constipation    Discharge Medications: Allergies as of 06/14/2023       Reactions   Erythromycin Shortness Of Breath, Swelling   "throat swelling", sob, thrashing ,    Penicillins Rash   Tape Other (See Comments)   Patient states that she prefers paper tape because other tapes tear skin.   Doxylamine-dm Rash   Nyquil [pseudoeph-doxylamine-dm-apap] Rash   Sulfa Antibiotics Rash        Medication List     TAKE these medications    apixaban 2.5 MG Tabs tablet Commonly known as: ELIQUIS Take 1 tablet (2.5 mg total) by mouth 2 (two) times daily.   cloNIDine 0.1 MG tablet Commonly known as: CATAPRES Take 1 tablet (0.1 mg total) by mouth daily as needed (FOR SYSTOLIC BLOOD PRESSURE GREATER THAN 170 AND OR DIASTOLIC FOR BLOOD PRESSURE GREATER 100).   FreeStyle Libre 14 Day Sensor Misc 1 each by Other route See admin instructions.   HumaLOG 100 UNIT/ML injection Generic drug: insulin lispro Inject 50-100 Units into the skin See admin instructions. Pump   Lantus SoloStar 100 UNIT/ML Solostar Pen Generic drug: insulin glargine Inject 1-100 Units into the skin in the morning. Unknown Sliding scale   levothyroxine 88 MCG tablet Commonly known as: SYNTHROID Take 1 tablet (88 mcg total) by mouth daily at 6 (six) AM.   lidocaine-prilocaine cream Commonly known as: EMLA Apply 1 Application topically once.   metoprolol tartrate 25 MG tablet Commonly known as:  LOPRESSOR Take 12.5 mg by mouth daily as needed (IF HR more than 100 and syst BP more than 160 per daughter).   nystatin powder Commonly known as: MYCOSTATIN/NYSTOP Apply topically 2 (two) times daily. What changed: how much to take   Omnipod DASH Pods (Gen 4) Misc SMARTSIG:SUB-Q Every 3 Days   ondansetron 4 MG tablet Commonly known as: ZOFRAN Take 1 tablet (4 mg total) by mouth every 6 (six) hours.   OneTouch Verio test strip Generic drug: glucose blood 1 each as needed for other (Glucose).   oxyCODONE-acetaminophen 5-325 MG tablet Commonly known as: Percocet Take 1 tablet by mouth every 6 (six) hours as needed for severe pain.   UltiCare Insulin Syringe 30G X 1/2" 0.5 ML Misc Generic drug: Insulin Syringe-Needle U-100 Please use as previously directed What changed:  how much to take how to take this when to take this               Discharge Care Instructions  (From admission, onward)           Start     Ordered   06/14/23 0000  No dressing needed        06/14/23 1121            Disposition and follow-up:   Ms.Cheyne E Nazaryan was discharged from [facility] in [place] condition.  At the hospital follow up visit please address:  1.  Follow-up:  a.  Hip pain -Improved after receiving HD.  b.  Right-sided pleural effusion: -Likely secondary to volume overload.  No thoracentesis given she was asymptomatic.  c.  CT abdomen and pelvis showed evidence of edema in the right breast, concerning for mastitis. Given patient's prior history of breast cancer consider evaluation with mammogram.  D.  We also discussed [facility] with patient and daughter.    2.  Labs / imaging needed at time of follow-up: Mammogram   Follow-up Appointments:  Follow-up Information     [facility] Follow up.   Contact information: [address] [place] [address] [phone]                 Hospital Course by problem  list: CRESTINA MEY is a 82 y.o. person living with a history of ESRD, CHF, breast cancer who presented with left hip pain and admitted for multifactorial volume overloaded state    #Acute HF Exacerbation  #ESRD on HD MWF #HTN #Large Pleural Effusion Pt presented in acute volume overloaded state exhibited by imaging and some edema on her lower extremities, likely multifactorial in nature. No exacerbating factors such as recent illnesses, blood pressure has been elevated however. She is sating well on room air.  She received hemodialysis, to remove the extra fluid.  On imaging, there was also right pleural effusion, but given the patient was not hypoxic, and not complaining of shortness of breath thoracentesis was not explored.  Patient's nephrologist is exploring also the option of adding additional day to her dialysis schedule.  Outpatient dialysis clinic is yet to respond.  -Continue clonidine and metoprolol, consider adding hydralazine if BP still uncontrolled.  -Continue hemodialysis for ESRD. -Home health PT.   #Constipation Received senna and liquid docusate.  Chronic Problems  Afib: Continuing eliquis 2.5mg  BID T1DM: On omni pod, can consider using SSI Hypothyroidism: Continuing levothyroixine Anemia: Hb stable at 11.7    Discharge Subjective: Patient evaluated at bedside this AM.  Discharge Exam:   BP (!) 119/44 (BP Location: Left Arm)   Pulse 84   Temp 97.8 F (36.6 C) (Oral)   Resp 17   Ht 5' 2.01" (1.575 m)   Wt 55.7 kg   SpO2 98%   BMI 22.43 kg/m  Constitutional: Well-appearing, sitting in bed. Cardiovascular: regular rate and rhythm, no m/r/g Pulmonary/Chest: normal work of breathing on room air, lungs clear to auscultation bilaterally Abdominal: soft, non-tender, non-distended. MSK: normal bulk and tone  Pertinent Labs, Studies, and Procedures:     Latest Ref Rng & Units 06/14/2023   12:58 AM 06/13/2023    8:19 AM 06/13/2023    7:18 AM  CBC  WBC  4.0 - 10.5 K/uL 10.5  13.2  13.6   Hemoglobin 12.0 - 15.0 g/dL 78.4  69.6  29.5   Hematocrit 36.0 - 46.0 % 39.5  34.1  35.4   Platelets 150 - 400 K/uL 348  323  318        Latest Ref Rng & Units 06/14/2023   12:58 AM 06/13/2023    9:18 AM 06/13/2023    7:18 AM  CMP  Glucose 70 - 99 mg/dL 284  132  96   BUN 8 - 23 mg/dL 27  48  46   Creatinine 0.44 - 1.00 mg/dL 4.40  1.02  7.25   Sodium 135 - 145 mmol/L 134  134  132   Potassium 3.5 - 5.1 mmol/L 4.2  4.2  4.9   Chloride 98 - 111 mmol/L 91  92  90   CO2 22 - 32 mmol/L 29  28  31    Calcium 8.9 - 10.3 mg/dL 9.3  8.8  8.8     CT ABDOMEN PELVIS WO CONTRAST  Result Date: 06/11/2023 CLINICAL DATA:  Shortness of breath.  Acute abdominal pain EXAM: CT CHEST, ABDOMEN AND PELVIS WITHOUT CONTRAST TECHNIQUE: Multidetector CT imaging of the chest, abdomen and pelvis was performed following the standard protocol without IV contrast. RADIATION DOSE REDUCTION: This exam was performed according to the departmental dose-optimization program which includes automated exposure control, adjustment of the mA and/or kV according to patient size and/or use of iterative reconstruction technique. COMPARISON:  05/05/2023 CTA chest, prior CT abdomen 11/20/2017 FINDINGS: CT CHEST FINDINGS Cardiovascular: Atherosclerotic vascular calcification of the thoracic aorta, left main, left anterior descending, and right coronary artery. Mitral valve calcification. Mild cardiomegaly. Mediastinum/Nodes: Right paratracheal node borderline prominent at 1.0 cm in short axis on image 17 series 3. Prior left axillary node dissection and mastectomy. Lungs/Pleura: Large right and trace left pleural effusions with passive atelectasis. Cylindrical bronchiectasis anteriorly in the right upper lobe. Mild secondary pulmonary lobular interstitial accentuation at the lung apices, cannot exclude mild interstitial edema. There is mild atelectasis in the lingula. Musculoskeletal: Left mastectomy; edema  in the right breast, significance uncertain, cannot exclude mastitis. Thoracic spondylosis. CT ABDOMEN PELVIS FINDINGS Hepatobiliary: Prior cholecystectomy. Suspected mild nonspecific periportal edema. Pancreas: Unremarkable Spleen: Unremarkable Adrenals/Urinary Tract: Small amount of gas in the urinary bladder, query recent catheterization. Mild left hydronephrosis and hydroureter, specific cause not identified. Adrenal glands normal. Stomach/Bowel: Appendix surgically absent. There is some scattered diverticula along the colon. No dilated bowel. Vascular/Lymphatic: Atherosclerosis is present, including aortoiliac atherosclerotic disease. No pathologic adenopathy observed. Reproductive: Uterus absent.  No adnexal mass identified. Other: Small amount of pelvic ascites with trace free fluid along the paracolic gutters and adjacent to the liver. Musculoskeletal: Prominent bilateral digit degenerative hip arthropathy. 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 IMPRESSION: 1. Large right and trace left pleural effusions with passive atelectasis. 2. Edema confluent inferiorly in the right breast, cannot exclude mastitis, correlate with any associated symptoms. Prior left mastectomy. 3. Mild secondary pulmonary lobular interstitial accentuation at the lung apices, cannot exclude mild interstitial edema. 4. Mild cardiomegaly. 5. Atherosclerosis including left main, left anterior descending, and right coronary artery. 6. Mitral valve calcification. 7. Small amount of pelvic ascites with trace free fluid along the paracolic gutters and adjacent to the liver. 8. Mild left hydronephrosis and hydroureter, specific cause not identified. 9. Small amount of gas in the urinary bladder, query recent catheterization. Aortic Atherosclerosis (ICD10-I70.0). Electronically Signed   By: Gaylyn Rong M.D.   On: 06/11/2023 12:56   CT CHEST WO CONTRAST  Result Date: 06/11/2023 CLINICAL DATA:  Shortness of breath.  Acute  abdominal pain EXAM: CT CHEST, ABDOMEN AND PELVIS WITHOUT CONTRAST TECHNIQUE: Multidetector CT imaging of the chest, abdomen and pelvis was performed following the standard protocol without IV contrast. RADIATION DOSE REDUCTION: This exam was performed according to the departmental dose-optimization program which includes automated exposure control, adjustment of the mA and/or kV according to patient size and/or use of iterative reconstruction technique. COMPARISON:  05/05/2023 CTA chest, prior CT abdomen 11/20/2017 FINDINGS: CT CHEST FINDINGS Cardiovascular: Atherosclerotic vascular calcification of the thoracic aorta, left main, left anterior descending, and right coronary artery. Mitral valve calcification. Mild cardiomegaly. Mediastinum/Nodes: Right paratracheal node borderline prominent at 1.0 cm in short axis on image 17 series 3. Prior left axillary node dissection and  mastectomy. Lungs/Pleura: Large right and trace left pleural effusions with passive atelectasis. Cylindrical bronchiectasis anteriorly in the right upper lobe. Mild secondary pulmonary lobular interstitial accentuation at the lung apices, cannot exclude mild interstitial edema. There is mild atelectasis in the lingula. Musculoskeletal: Left mastectomy; edema in the right breast, significance uncertain, cannot exclude mastitis. Thoracic spondylosis. CT ABDOMEN PELVIS FINDINGS Hepatobiliary: Prior cholecystectomy. Suspected mild nonspecific periportal edema. Pancreas: Unremarkable Spleen: Unremarkable Adrenals/Urinary Tract: Small amount of gas in the urinary bladder, query recent catheterization. Mild left hydronephrosis and hydroureter, specific cause not identified. Adrenal glands normal. Stomach/Bowel: Appendix surgically absent. There is some scattered diverticula along the colon. No dilated bowel. Vascular/Lymphatic: Atherosclerosis is present, including aortoiliac atherosclerotic disease. No pathologic adenopathy observed. Reproductive:  Uterus absent.  No adnexal mass identified. Other: Small amount of pelvic ascites with trace free fluid along the paracolic gutters and adjacent to the liver. Musculoskeletal: Prominent bilateral digit degenerative hip arthropathy. 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 IMPRESSION: 1. Large right and trace left pleural effusions with passive atelectasis. 2. Edema confluent inferiorly in the right breast, cannot exclude mastitis, correlate with any associated symptoms. Prior left mastectomy. 3. Mild secondary pulmonary lobular interstitial accentuation at the lung apices, cannot exclude mild interstitial edema. 4. Mild cardiomegaly. 5. Atherosclerosis including left main, left anterior descending, and right coronary artery. 6. Mitral valve calcification. 7. Small amount of pelvic ascites with trace free fluid along the paracolic gutters and adjacent to the liver. 8. Mild left hydronephrosis and hydroureter, specific cause not identified. 9. Small amount of gas in the urinary bladder, query recent catheterization. Aortic Atherosclerosis (ICD10-I70.0). Electronically Signed   By: Gaylyn Rong M.D.   On: 06/11/2023 12:56   DG Hip Unilat With Pelvis 2-3 Views Left  Result Date: 06/11/2023 CLINICAL DATA:  Fall with left hip pain and discomfort. EXAM: DG HIP (WITH OR WITHOUT PELVIS) 2-3V LEFT COMPARISON:  None Available. FINDINGS: Bones are diffusely demineralized. SI joints and symphysis pubis unremarkable. No evidence for pubic ramus fracture. AP and frog-leg lateral views of the left hip show no evidence for femoral neck fracture IMPRESSION: 1. No evidence for left hip fracture. 2. Diffuse bone demineralization. Electronically Signed   By: Kennith Center M.D.   On: 06/11/2023 10:41   DG Chest Portable 1 View  Result Date: 06/11/2023 CLINICAL DATA:  Fall today with left hip pain EXAM: PORTABLE CHEST 1 VIEW COMPARISON:  05/22/2023 FINDINGS: Chronic cardiomegaly and congested appearance of pulmonary  vessels with small right pleural effusion. Postoperative left breast and axilla. IMPRESSION: 1. Stable from 05/22/2023.  No acute finding. 2. Cardiomegaly, vascular congestion, and small right pleural effusion. Electronically Signed   By: Tiburcio Pea M.D.   On: 06/11/2023 10:40     Discharge Instructions: Discharge Instructions     Call MD for:  difficulty breathing, headache or visual disturbances   Complete by: As directed    Diet - low sodium heart healthy   Complete by: As directed    Increase activity slowly   Complete by: As directed    No dressing needed   Complete by: As directed        Signed:  Laretta Bolster, M.D.  Internal Medicine Resident, PGY-1 [facility]  Pager: [phone] 11:31 AM, 06/14/2023   **Please contact the on call pager after 5 pm and on weekends at [phone].**

## 2023-06-14 NOTE — Progress Notes (Signed)
OT Cancellation Note  Patient Details Name: Elizabeth Olsen MRN: [id number] DOB: July 14, 1941   Cancelled Treatment:    Reason Eval/Treat Not Completed:  (Pt preparing to discharge, per family they have requested HHOT, will defer evaluation.)  Evern Bio 06/14/2023, 11:57 AM Berna Spare, OTR/L Acute Rehabilitation Services Office: [phone]

## 2023-06-14 NOTE — Discharge Instructions (Addendum)
It was a pleasure taking care of you.  You were admitted due to hip pain from increased fluid accumulation.  You were treated with scheduled hemodialysis which has helped improve your hip pain. - Continue with scheduled hemodialysis and MWF.  Nausea:  -Take 1 tablet of Zofran 4 mg by mouth as needed.  - Please continue to work with home health PT, to help with your strength.  -- Please call [facility], to learn more about this service.  # [phone].  --Follow up with Dr. Morene Crocker in clinic on September 3rd.

## 2023-06-14 NOTE — Plan of Care (Signed)
[facility] Patient Discharge Orders- [facility] CLINIC: [facility]  Patient's name: Elizabeth Olsen Admit/DC Dates: 06/11/2023 - 06/14/2023  Discharge Diagnoses: Acute on chronic CHF/large pleural effusions- Per pulmonary, they do not feel thoracentesis would help. French Ana reached out to [facility] nurse manager to inquire on doing 4x/week HD but facility doesn't have the availability.    Aranesp: Given: No   Last Hgb: 11.9 PRBC's Given: No ESA dose for discharge: Hold ESA for now. Last Hgb here was 11.9  Heparin change: No  EDW Change: Yes New EDW: 56.5kg  Bath Change: Change to 2K/2Ca baths  Access intervention/Change: No  Hectorol/Calcitriol change: N/A  Discharge Labs: Calcium 9.3 Phosphorus 2.6 Albumin 2.9 K+ 4.2  IV Antibiotics: No  On Coumadin?: No    D/C Meds to be reconciled by nurse after every discharge.  Completed By: Salome Holmes, NP   Reviewed by: MD:______ RN_______

## 2023-06-14 NOTE — Progress Notes (Signed)
Physical Therapy Treatment Patient Details Name: Elizabeth Olsen MRN: [id number] DOB: 1941-02-16 Today's Date: 06/14/2023   History of Present Illness 82 yo female presents to [facility] on 06/11/2023 with abdominal and LLE pain. Pt found to have pleural effusion, admitted for management of volume overload. PMH include CAD, A-fib, CHF, multiple myeloma in relapse, diabetes, ESRD, breast cancer, and hypothyroidism.    PT Comments  The pt continues to make good progress with mobility and endurance this session, no reports of SOB even with progression in total distance and reduction in UE support (from RW to single HHA). The pt and her daughter were educated on progressive walking program and exercises to continue while awaiting HHPT, also discussed eventual progression to silver sneakers program for continual strength-building. The pt and her daughter were agreeable, safe for d/c home with daughter when medically stable.      If plan is discharge home, recommend the following: A little help with walking and/or transfers;A little help with bathing/dressing/bathroom;Assistance with cooking/housework;Direct supervision/assist for medications management;Direct supervision/assist for financial management;Assist for transportation;Help with stairs or ramp for entrance   Can travel by private vehicle        Equipment Recommendations  None recommended by PT    Recommendations for Other Services       Precautions / Restrictions Precautions Precautions: Fall Restrictions Weight Bearing Restrictions: No     Mobility  Bed Mobility Overal bed mobility: Needs Assistance             General bed mobility comments: pt OOB in recliner at start and end of session    Transfers Overall transfer level: Needs assistance Equipment used: Rolling walker (2 wheels) Transfers: Sit to/from Stand Sit to Stand: Contact guard assist           General transfer comment: CGA to rise, cues for hand placement, no  overt LOB    Ambulation/Gait Ambulation/Gait assistance: Supervision Gait Distance (Feet): 250 Feet Assistive device: Rolling walker (2 wheels), 1 person hand held assist Gait Pattern/deviations: Step-through pattern Gait velocity: reduced Gait velocity interpretation: <1.31 ft/sec, indicative of household ambulator   General Gait Details: slowed, guarded but steady without RW. (it was making a lot of noise, pt agreeable to walk with single UE support and needed CGA only to steady   Stairs             Wheelchair Mobility     Tilt Bed    Modified Rankin (Stroke Patients Only)       Balance Overall balance assessment: Needs assistance Sitting-balance support: No upper extremity supported, Feet supported Sitting balance-Leahy Scale: Good     Standing balance support: Single extremity supported, Reliant on assistive device for balance Standing balance-Leahy Scale: Fair Standing balance comment: can static stand without UE support, or walk with single UE support                            Cognition Arousal: Alert Behavior During Therapy: WFL for tasks assessed/performed Overall Cognitive Status: Within Functional Limits for tasks assessed                                 General Comments: mild memory deficits, daughter reports this is baseline        Exercises Other Exercises Other Exercises: discussed walking program, building intensity, and progression to silver sneakers program    General Comments General comments (  skin integrity, edema, etc.): VSS on RA        PT Goals (current goals can now be found in the care plan section) Acute Rehab PT Goals Patient Stated Goal: to return home PT Goal Formulation: With patient/family Time For Goal Achievement: 06/26/23 Potential to Achieve Goals: Good Progress towards PT goals: Progressing toward goals    Frequency    Min 1X/week       AM-PAC PT "6 Clicks" Mobility   Outcome  Measure  Help needed turning from your back to your side while in a flat bed without using bedrails?: A Little Help needed moving from lying on your back to sitting on the side of a flat bed without using bedrails?: A Little Help needed moving to and from a bed to a chair (including a wheelchair)?: A Little Help needed standing up from a chair using your arms (e.g., wheelchair or bedside chair)?: A Little Help needed to walk in hospital room?: A Little Help needed climbing 3-5 steps with a railing? : Total 6 Click Score: 16    End of Session Equipment Utilized During Treatment: Gait belt Activity Tolerance: Patient tolerated treatment well Patient left: in bed;with call bell/phone within reach;with family/visitor present Nurse Communication: Mobility status PT Visit Diagnosis: Other abnormalities of gait and mobility (R26.89);Muscle weakness (generalized) (M62.81)     Time: 4540-9811 PT Time Calculation (min) (ACUTE ONLY): 18 min  Charges:    $Gait Training: 8-22 mins PT General Charges $$ ACUTE PT VISIT: 1 Visit                     Vickki Muff, PT, DPT   Acute Rehabilitation Department Office [phone] Secure Chat Communication Preferred   Ronnie Derby 06/14/2023, 12:06 PM

## 2023-06-14 NOTE — Plan of Care (Signed)

## 2023-06-14 NOTE — Progress Notes (Signed)
Nephrology Follow-Up Consult note   Assessment/Recommendations: Elizabeth Olsen is a/an 82 y.o. female with a past medical history significant for ESRD, admitted for shortness of breath and volume overlaod       Dialysis Orders:  MWF - [facility] 4hrs, BFR 400, DFR 800,  EDW 58kg, 3K/ 2.5Ca No heparin Mircera 200 mcg q2wks - last 06/06/23   Last Labs: Hgb 11.7, K 5.3, Ca 9.2, Alb 3.4   Assessment/Plan: Shortness of breath: Mostly related to volume excess.  Pulmonology did not feel thoracentesis was warranted.  Continue with volume optimization on dialysis.  Much better Left hip pain - managed by primary, improved ESRD - on HD MWF.  Maintain schedule. SW helping see if we can do 4x weekly HD in outpatient setting Hypertension/volume  -blood pressure up-and-down.  Blood pressure improved with volume optimization.  Some hypotension.  Not using scheduled midodrine at this time given she is asymptomatic.  We are providing some midodrine on dialysis PRN to help with ultrafiltration. Weight around 56kg here. Will need to adjust edw. Anemia of CKD - Hgb ~11. No indication for Fe or ESA at this time. Secondary Hyperparathyroidism -calcium and phosphorus at goal Nutrition -can liberalize diet. Limit fluids Mastitis - appreciated on CT. Plan per primary Recurrent pleural effusion -pulmonology to not feel thoracentesis is helpful.  Appreciate insight GOC: Palliative care following.  Appreciate help   Recommendations conveyed to primary service.    Darnell Level [facility] 06/14/2023 9:54 AM  ___________________________________________________________  CC: Shortness of breath  Interval History/Subjective: Patient feels well today.  No complaints.  Dialysis went well yesterday.  Medications:  Current Facility-Administered Medications  Medication Dose Route Frequency Provider Last Rate Last Admin   acetaminophen (TYLENOL) tablet 1,000 mg  1,000 mg Oral  Q6H PRN Nooruddin, Saad, MD   1,000 mg at 06/13/23 8295   acetaminophen (TYLENOL) tablet 650 mg  650 mg Oral Q6H PRN Laretta Bolster, MD       apixaban Everlene Balls) tablet 2.5 mg  2.5 mg Oral BID Nooruddin, Saad, MD   2.5 mg at 06/13/23 2109   camphor-menthol (SARNA) lotion   Topical PRN Morrie Sheldon, MD       Chlorhexidine Gluconate Cloth 2 % PADS 6 each  6 each Topical Q0600 Berenda Morale, NP   6 each at 06/14/23 6213   cloNIDine (CATAPRES) tablet 0.1 mg  0.1 mg Oral Daily PRN Nooruddin, Jason Fila, MD       docusate (COLACE) 50 MG/5ML liquid 50 mg  50 mg Oral BID Laretta Bolster, MD       insulin pump   Subcutaneous TID WC, HS, 0200 Masters, Katie, DO       levothyroxine (SYNTHROID) tablet 88 mcg  88 mcg Oral Q0600 Nooruddin, Jason Fila, MD   88 mcg at 06/14/23 0621   lidocaine (LIDODERM) 5 % 1 patch  1 patch Transdermal Q24H Nooruddin, Saad, MD       metoprolol tartrate (LOPRESSOR) tablet 12.5 mg  12.5 mg Oral Daily PRN Nooruddin, Saad, MD       midodrine (PROAMATINE) tablet 5 mg  5 mg Oral Daily PRN Darnell Level, MD   5 mg at 06/13/23 1146   ondansetron (ZOFRAN) injection 4 mg  4 mg Intravenous Once Ali, Amjad, PA-C       senna (SENOKOT) tablet 17.2 mg  2 tablet Oral Daily Nooruddin, Saad, MD   17.2 mg at 06/12/23 1029   sodium zirconium cyclosilicate (LOKELMA) packet 10  g  10 g Oral Once [name], MD          Review of Systems: 10 systems reviewed and negative except per interval history/subjective  Physical Exam: Vitals:   06/14/23 0516 06/14/23 0803  BP: (!) 132/115 (!) 119/44  Pulse: 80 84  Resp: 17 17  Temp: 98 F (36.7 C) 97.8 F (36.6 C)  SpO2: 100% 98%   No intake/output data recorded.  Intake/Output Summary (Last 24 hours) at 06/14/2023 0954 Last data filed at 06/13/2023 1500 Gross per 24 hour  Intake 240 ml  Output 3000 ml  Net -2760 ml   Constitutional: Chronically ill appearing, no acute distress ENMT: ears and nose without scars or lesions, MMM CV: normal  rate, no edema Respiratory: Bilateral chest rise, normal work of breathing Gastrointestinal: soft, non-tender, no palpable masses or hernias Skin: no visible lesions or rashes Psych: alert, appropriate mood and affect   Test Results I personally reviewed new and old clinical labs and radiology tests Lab Results  Component Value Date   NA 134 (L) 06/14/2023   K 4.2 06/14/2023   CL 91 (L) 06/14/2023   CO2 29 06/14/2023   BUN 27 (H) 06/14/2023   CREATININE 3.53 (H) 06/14/2023   CALCIUM 9.3 06/14/2023   ALBUMIN 2.9 (L) 06/13/2023   PHOS 2.6 06/13/2023    CBC Recent Labs  Lab 06/11/23 1018 06/12/23 0241 06/13/23 0718 06/13/23 0819 06/14/23 0058  WBC 11.1*   < > 13.6* 13.2* 10.5  NEUTROABS 9.5*  --   --   --  7.4  HGB 11.7*   < > 10.9* 10.6* 11.9*  HCT 39.1   < > 35.4* 34.1* 39.5  MCV 98.5   < > 95.7 97.7 98.3  PLT 310   < > 318 323 348   < > = values in this interval not displayed.

## 2023-06-15 ENCOUNTER — Telehealth: Payer: Self-pay | Admitting: Nephrology

## 2023-06-15 NOTE — Progress Notes (Signed)
Late Note Entry- June 15, 2023  Pt was d/c yesterday and appears that orders may have been sent to wrong clinic. D/C summary, last renal progress note, and d/c orders faxed to [facility] this morning for continuation of care.   Olivia Canter Renal Navigator [phone]

## 2023-06-15 NOTE — Telephone Encounter (Signed)
Transition of Care - Initial Contact from Inpatient Facility  Date of discharge: 8/45/24 Date of contact: 06/15/23   Method: Phone Spoke to: Patient's daughter   Patient contacted to discuss transition of care from recent inpatient hospitalization. Patient was admitted to [facility] from 8/12 -06/15/23 with discharge diagnosis of volume overload   The discharge medication list was reviewed. They are concerned about her frequent hospital admissions for volume overload. Doesn't tolerate much UF without symptomatic hypotension. Thoracentesis not indicated this admission. Continue to follow weights closely at dialysis.   Patient will return to his/her outpatient HD unit on: She had dialysis today. Next dialysis is Monday 8/19

## 2023-06-19 ENCOUNTER — Other Ambulatory Visit: Payer: 59

## 2023-06-19 ENCOUNTER — Ambulatory Visit (INDEPENDENT_AMBULATORY_CARE_PROVIDER_SITE_OTHER): Payer: 59 | Admitting: Physician Assistant

## 2023-06-19 ENCOUNTER — Encounter: Payer: Self-pay | Admitting: Hematology

## 2023-06-19 ENCOUNTER — Ambulatory Visit: Payer: 59

## 2023-06-19 ENCOUNTER — Encounter: Payer: Self-pay | Admitting: Physician Assistant

## 2023-06-19 VITALS — BP 140/63 | HR 74 | Resp 18 | Ht 62.0 in | Wt 132.0 lb

## 2023-06-19 DIAGNOSIS — R413 Other amnesia: Secondary | ICD-10-CM

## 2023-06-19 LAB — TSH: TSH: 2.87 u[IU]/mL (ref 0.35–5.50)

## 2023-06-19 LAB — VITAMIN B12: Vitamin B-12: >1501 pg/mL — ABNORMAL HIGH (ref 211–911)

## 2023-06-19 MED ORDER — DONEPEZIL HCL 5 MG PO TABS: 5.0000 mg | ORAL_TABLET | Freq: Every day | ORAL | 11 refills | Status: DC

## 2023-06-19 NOTE — Progress Notes (Addendum)
Assessment/Plan:    The patient is seen in neurologic consultation at the request of Gloris Manchester, MD for the evaluation of memory.  Elizabeth Olsen is a very pleasant 82 y.o. year old RH female with a history of hypertension, hyperlipidemia, ESRD on HD, DM2, CHF with recent acute exacerbation and right-sided pleural effusion and increased confusion likely due to above, requiring hospitalization in August 2024, hypothyroidism, anemia of chronic disease seen today for evaluation of memory loss. MoCA today is 14/30 .  Patient is more assistance with ADLs than prior.  Workup is currently in progress, although disease concern for dementia likely due to Alzheimer's disease.     Memory Impairment with behavioral disturbance  MRI brain without contrast to assess for underlying structural abnormality and assess vascular load  Check B12, TSH Neuropsych testing for clarity of diagnosis and disease trajectory   Start donepezil 5 mg daily, side effects discussed.  If tolerated, will increase it to 10 mg daily. Recommend good control of cardiovascular risk factors.   Continue to control mood as per PCP Recommend adult day program for increasing socialization and cognitive stimulation Fol[place]ow up in 2 months  Subjective:    The patient is accompanied by her daughter who supplements the history.    How long did patient have memory difficulties?  At least for 3 years, after her husband died.  About 2 years ago, the family began to notice again changes, especially with recent conversations, new information some names.  She has been confabulating more.  Also, "time perception is definitely off, she has her own timeline "she has not been brain games although she likes game shows.  She likes going to church.    repeats oneself?  Endorsed Disoriented when walking into a room? When in the hospital she thought she was in a hotel in [place]. "She moved from [facility] and things took a turn"-daughter says Leaving  objects in unusual places?  We kind of do everything for her, but she may think she has a piece of jewelry but lost it.   Wandering behavior? denies   Any personality changes ?  "She is a little short tempered especially in the evenings, more argumentative if she says something and is being contradicted.  She is more outspoken than before "-daughter says. Any history of depression?: Denies a formal diagnosis of depression.  Hallucinations or paranoia? Only in the hospital.  For example, "she may see sock in the floor and believes it is a dog . She  sees people and talks to them.  Paranoid about her possessions.   Seizures? denies    Any sleep changes?  Sleeps well but she needs some meditation music. Gets up to go to the bathroom at night.  Denies vivid dreams, REM behavior or sleepwalking.   Sleep apnea? denies   Any hygiene concerns?  She needs to be reminded.  Independent of bathing and dressing? Endorsed, sometimes she may need some help with clothing. Does the patient need help with medications? Daughter  is in charge   Who is in charge of the finances?  Daughter is in charge     Any changes in appetite?  "She would be happy to eat same things daily ".  However, her daughter says that some foods that she used to like, she does not like that anymore.    Patient have trouble swallowing?  denies   Does the patient cook? No, "she is retired from cooking".  Any headaches?  denies  Chronic pain? Since [place], the patient has been in the hospital 3 times, with fluid overload which causes hip pain, which usually is relieved after hemodialysis  Ambulates with difficulty? Needs a walker to ambulate for stability.  Recent falls or head injuries? denies     Vision changes?  Denies changes Unilateral weakness, numbness or tingling? denies  She has peripheral neuropathy which causes some instability her feet, and she uses a walker Any tremors?  "When BP high she over 160 "she has tremors and is off  balance.  Any anosmia? denies   Any incontinence of urine?  "She used to be, now she is on HD, now she only trickles ".  Any bowel dysfunction? Constipation due to fluid restriction  Patient lives with  daughter.  She is looking into programs such as [facility] History of heavy alcohol intake? denies   History of heavy tobacco use? denies   Family history of dementia? MGM with dementia .  Mother was alcoholic.  Does patient drive? No    Allergies  Allergen Reactions   Erythromycin Shortness Of Breath and Swelling    "throat swelling", sob, thrashing ,    Penicillins Rash   Tape Other (See Comments)    Patient states that she prefers paper tape because other tapes tear skin.   Doxylamine-Dm Rash   Nyquil [Pseudoeph-Doxylamine-Dm-Apap] Rash   Sulfa Antibiotics Rash    Current Outpatient Medications  Medication Instructions   apixaban (ELIQUIS) 2.5 mg, Oral, 2 times daily   cloNIDine (CATAPRES) 0.1 mg, Oral, Daily PRN   Continuous Blood Gluc Sensor (FREESTYLE LIBRE 14 DAY SENSOR) MISC 1 each, Other, See admin instructions   docusate (COLACE) 50 mg, Oral, 2 times daily PRN   donepezil (ARICEPT) 5 mg, Oral, Daily   HumaLOG 50-100 Units, Subcutaneous, See admin instructions, Pump   Insulin Disposable Pump (OMNIPOD DASH 5 PACK PODS) MISC SMARTSIG:SUB-Q Every 3 Days   Insulin Syringe-Needle U-100 (ULTICARE INSULIN SYRINGE) 30G X 1/2" 0.5 ML MISC Please use as previously directed   Lantus SoloStar 1-100 Units, Subcutaneous, Every morning, Unknown Sliding scale   levothyroxine (SYNTHROID) 88 mcg, Oral, Daily   lidocaine-prilocaine (EMLA) cream 1 Application, Topical,  Once   metoprolol tartrate (LOPRESSOR) 12.5 mg, Oral, Daily PRN   nystatin (MYCOSTATIN/NYSTOP) powder Topical, 2 times daily   ondansetron (ZOFRAN) 4 mg, Oral, Every 8 hours PRN   ONETOUCH VERIO test strip 1 each, As needed   oxyCODONE-acetaminophen (PERCOCET) 5-325 MG tablet 1 tablet, Oral, Every 6 hours PRN   senna (SENOKOT)  17.2 mg, Oral, Daily PRN     VITALS:   Vitals:   06/19/23 1007  BP: (!) 140/63  Pulse: 74  Resp: 18  SpO2: 95%  Weight: 132 lb (59.9 kg)  Height: 5\' 2"  (1.575 m)      09/18/2018    2:02 PM  Depression screen PHQ 2/9  Decreased Interest 0  Down, Depressed, Hopeless 0  PHQ - 2 Score 0    PHYSICAL EXAM   HEENT:  Normocephalic, atraumatic.  The superficial temporal arteries are without ropiness or tenderness. Cardiovascular: Regular rate and rhythm. Lungs: Clear to auscultation bilaterally. Neck: There are no carotid bruits noted bilaterally.  NEUROLOGICAL:    06/19/2023    1:00 PM  Montreal Cognitive Assessment   Visuospatial/ Executive (0/5) 4  Naming (0/3) 2  Attention: Read list of digits (0/2) 2  Attention: Read list of letters (0/1) 1  Attention: Serial 7 subtraction starting at 100 (0/3) 1  Language:  Repeat phrase (0/2) 0  Language : Fluency (0/1) 0  Abstraction (0/2) 1  Delayed Recall (0/5) 0  Orientation (0/6) 2  Total 13  Adjusted Score (based on education) 14        No data to display           Orientation:  Alert and oriented to person, not to place or time. No aphasia or dysarthria. Fund of knowledge is reduced.. Recent and remote memory impaired.  Attention and concentration are reduced able to name objects and unable to repeat phrases. Delayed recall 0/5  Cranial nerves: There is good facial symmetry. Extraocular muscles are intact and visual fields are full to confrontational testing. Speech is fluent and clear. No tongue deviation. Hearing is intact to conversational tone.  Tone: Tone is good throughout. Sensation: Sensation is intact to light touch.  Vibration is intact at the bilateral big toe.  Coordination: The patient has no difficulty with RAM's or FNF bilaterally. Normal finger to nose  Motor: Strength is 5/5 in the bilateral upper and lower extremities. There is no pronator drift. There are no fasciculations noted. DTR's: Deep tendon  reflexes are 2/4 bilaterally. Gait and Station: The patient is able to ambulate without difficulty. Gait is cautious and narrow. Stride length is normal        Thank you for allowing [place] the opportunity to participate in the care of this nice patient. Please do not hesitate to contact us for any questions or concerns.   Total time spent on today's visit was 54 minutes dedicated to this patient today, preparing to see patient, examining the patient, ordering tests and/or medications and counseling the patient, documenting clinical information in the EHR or other health record, independently interpreting results and communicating results to the patient/family, discussing treatment and goals, answering patient's questions and coordinating care.  Cc:  Morene Crocker, MD  Marlowe Kays 06/19/2023 1:53 PM

## 2023-06-19 NOTE — Patient Instructions (Addendum)
It was a pleasure to see you today at our office.   Recommendations:  Neurocognitive evaluation at our office  MRI of the brain, the radiology office will call you to arrange you appointment   Check labs today B12, TSH Start Donepezil 5mg  daily  Follow up in 2  months   For psychiatric meds, mood meds: Please have your primary care physician manage these medications.  If you have any severe symptoms of a stroke, or other severe issues such as confusion,severe chills or fever, etc call 911 or go to the ER as you may need to be evaluated further  For guidance regarding [facility] and if placement were needed at the facility, contact Social Worker tel: [phone]  For assessment of decision of mental capacity and competency:  Call Dr. Erick Blinks, geriatric psychiatrist at [phone]  Counseling regarding caregiver distress, including caregiver depression, anxiety and issues regarding community resources, adult day care programs, adult living facilities, or memory care questions:  please contact your  Primary Doctor's Social Worker   Whom to call: Memory  decline, memory medications: Call our office [phone]    [email or web address]   RECOMMENDATIONS FOR ALL PATIENTS WITH MEMORY PROBLEMS: 1. Continue to exercise (Recommend 30 minutes of walking everyday, or 3 hours every week) 2. Increase social interactions - continue going to [place] and enjoy social gatherings with friends and family 3. Eat healthy, avoid fried foods and eat more fruits and vegetables 4. Maintain adequate blood pressure, blood sugar, and blood cholesterol level. Reducing the risk of stroke and cardiovascular disease also helps promoting better memory. 5. Avoid stressful situations. Live a simple life and avoid aggravations. Organize your time and prepare for the next day in anticipation. 6. Sleep well, avoid any interruptions of sleep  and avoid any distractions in the bedroom that may interfere with adequate sleep quality 7. Avoid sugar, avoid sweets as there is a strong link between excessive sugar intake, diabetes, and cognitive impairment We discussed the Mediterranean diet, which has been shown to help patients reduce the risk of progressive memory disorders and reduces cardiovascular risk. This includes eating fish, eat fruits and green leafy vegetables, nuts like almonds and hazelnuts, walnuts, and also use olive oil. Avoid fast foods and fried foods as much as possible. Avoid sweets and sugar as sugar use has been linked to worsening of memory function.  There is always a concern of gradual progression of memory problems. If this is the case, then we may need to adjust level of care according to patient needs. Support, both to the patient and caregiver, should then be put into place.      You have been referred for a neuropsychological evaluation (i.e., evaluation of memory and thinking abilities). Please bring someone with you to this appointment if possible, as it is helpful for the doctor to hear from both you and another adult who knows you well. Please bring eyeglasses and hearing aids if you wear them.    The evaluation will take approximately 3 hours and has two parts:   The first part is a clinical interview with the neuropsychologist (Dr. Milbert Coulter or Dr. Roseanne Reno). During the interview, the neuropsychologist will speak with you and the individual you brought to the appointment.    The second part of the evaluation is testing with the doctor's technician Annabelle Harman or Selena Batten). During the testing, the technician will ask you to remember different types of material, solve problems, and answer some questionnaires. Your family member will  not be present for this portion of the evaluation.   Please note: We must reserve several hours of the neuropsychologist's time and the psychometrician's time for your evaluation appointment. As  such, there is a No-Show fee of $100. If you are unable to attend any of your appointments, please contact our office as soon as possible to reschedule.      FALL PRECAUTIONS: Be cautious when walking. Scan the area for obstacles that may increase the risk of trips and falls. When getting up in the mornings, sit up at the edge of the bed for a few minutes before getting out of bed. Consider elevating the bed at the head end to avoid drop of blood pressure when getting up. Walk always in a well-lit room (use night lights in the walls). Avoid area rugs or power cords from appliances in the middle of the walkways. Use a walker or a cane if necessary and consider physical therapy for balance exercise. Get your eyesight checked regularly.  FINANCIAL OVERSIGHT: Supervision, especially oversight when making financial decisions or transactions is also recommended.  HOME SAFETY: Consider the safety of the kitchen when operating appliances like stoves, microwave oven, and blender. Consider having supervision and share cooking responsibilities until no longer able to participate in those. Accidents with firearms and other hazards in the house should be identified and addressed as well.   ABILITY TO BE LEFT ALONE: If patient is unable to contact 911 operator, consider using LifeLine, or when the need is there, arrange for someone to stay with patients. Smoking is a fire hazard, consider supervision or cessation. Risk of wandering should be assessed by caregiver and if detected at any point, supervision and safe proof recommendations should be instituted.  MEDICATION SUPERVISION: Inability to self-administer medication needs to be constantly addressed. Implement a mechanism to ensure safe administration of the medications.      Mediterranean Diet A Mediterranean diet refers to food and lifestyle choices that are based on the traditions of countries located on the [employer] [place]. This way of eating has been  shown to help prevent certain conditions and improve outcomes for people who have chronic diseases, like kidney disease and heart disease. What are tips for following this plan? Lifestyle  Cook and eat meals together with your family, when possible. Drink enough fluid to keep your urine clear or pale yellow. Be physically active every day. This includes: Aerobic exercise like running or swimming. Leisure activities like gardening, walking, or housework. Get 7-8 hours of sleep each night. If recommended by your health care provider, drink red wine in moderation. This means 1 glass a day for nonpregnant women and 2 glasses a day for men. A glass of wine equals 5 oz (150 mL). Reading food labels  Check the serving size of packaged foods. For foods such as rice and pasta, the serving size refers to the amount of cooked product, not dry. Check the total fat in packaged foods. Avoid foods that have saturated fat or trans fats. Check the ingredients list for added sugars, such as corn syrup. Shopping  At the grocery store, buy most of your food from the areas near the walls of the store. This includes: Fresh fruits and vegetables (produce). Grains, beans, nuts, and seeds. Some of these may be available in unpackaged forms or large amounts (in bulk). Fresh seafood. Poultry and eggs. Low-fat dairy products. Buy whole ingredients instead of prepackaged foods. Buy fresh fruits and vegetables in-season from local farmers markets. Buy frozen  fruits and vegetables in resealable bags. If you do not have access to quality fresh seafood, buy precooked frozen shrimp or canned fish, such as tuna, salmon, or sardines. Buy small amounts of raw or cooked vegetables, salads, or olives from the deli or salad bar at your store. Stock your pantry so you always have certain foods on hand, such as olive oil, canned tuna, canned tomatoes, rice, pasta, and beans. Cooking  Cook foods with extra-virgin olive oil instead  of using butter or other vegetable oils. Have meat as a side dish, and have vegetables or grains as your main dish. This means having meat in small portions or adding small amounts of meat to foods like pasta or stew. Use beans or vegetables instead of meat in common dishes like chili or lasagna. Experiment with different cooking methods. Try roasting or broiling vegetables instead of steaming or sauteing them. Add frozen vegetables to soups, stews, pasta, or rice. Add nuts or seeds for added healthy fat at each meal. You can add these to yogurt, salads, or vegetable dishes. Marinate fish or vegetables using olive oil, lemon juice, garlic, and fresh herbs. Meal planning  Plan to eat 1 vegetarian meal one day each week. Try to work up to 2 vegetarian meals, if possible. Eat seafood 2 or more times a week. Have healthy snacks readily available, such as: Vegetable sticks with hummus. Greek yogurt. Fruit and nut trail mix. Eat balanced meals throughout the week. This includes: Fruit: 2-3 servings a day Vegetables: 4-5 servings a day Low-fat dairy: 2 servings a day Fish, poultry, or lean meat: 1 serving a day Beans and legumes: 2 or more servings a week Nuts and seeds: 1-2 servings a day Whole grains: 6-8 servings a day Extra-virgin olive oil: 3-4 servings a day Limit red meat and sweets to only a few servings a month What are my food choices? Mediterranean diet Recommended Grains: Whole-grain pasta. Brown rice. Bulgar wheat. Polenta. Couscous. Whole-wheat bread. Orpah Cobb. Vegetables: Artichokes. Beets. Broccoli. Cabbage. Carrots. Eggplant. Green beans. Chard. Kale. Spinach. Onions. Leeks. Peas. Squash. Tomatoes. Peppers. Radishes. Fruits: Apples. Apricots. Avocado. Berries. Bananas. Cherries. Dates. Figs. Grapes. Lemons. Melon. Oranges. Peaches. Plums. Pomegranate. Meats and other protein foods: Beans. Almonds. Sunflower seeds. Pine nuts. Peanuts. Cod. Salmon. Scallops. Shrimp.  Tuna. Tilapia. Clams. Oysters. Eggs. Dairy: Low-fat milk. Cheese. Greek yogurt. Beverages: Water. Red wine. Herbal tea. Fats and oils: Extra virgin olive oil. Avocado oil. Grape seed oil. Sweets and desserts: [place] yogurt with honey. Baked apples. Poached pears. Trail mix. Seasoning and other foods: Basil. Cilantro. Coriander. Cumin. Mint. Parsley. Sage. Rosemary. Tarragon. Garlic. Oregano. Thyme. Pepper. Balsalmic vinegar. Tahini. Hummus. Tomato sauce. Olives. Mushrooms. Limit these Grains: Prepackaged pasta or rice dishes. Prepackaged cereal with added sugar. Vegetables: Deep fried potatoes (french fries). Fruits: Fruit canned in syrup. Meats and other protein foods: Beef. Pork. Lamb. Poultry with skin. Hot dogs. Tomasa Blase. Dairy: Ice cream. Sour cream. Whole milk. Beverages: Juice. Sugar-sweetened soft drinks. Beer. Liquor and spirits. Fats and oils: Butter. Canola oil. Vegetable oil. Beef fat (tallow). Lard. Sweets and desserts: Cookies. Cakes. Pies. Candy. Seasoning and other foods: Mayonnaise. Premade sauces and marinades. The items listed may not be a complete list. Talk with your dietitian about what dietary choices are right for you. Summary The Mediterranean diet includes both food and lifestyle choices. Eat a variety of fresh fruits and vegetables, beans, nuts, seeds, and whole grains. Limit the amount of red meat and sweets that you eat. Talk with your  health care provider about whether it is safe for you to drink red wine in moderation. This means 1 glass a day for nonpregnant women and 2 glasses a day for men. A glass of wine equals 5 oz (150 mL). This information is not intended to replace advice given to you by your health care provider. Make sure you discuss any questions you have with your health care provider. Document Released: 06/08/2016 Document Revised: 07/11/2016 Document Reviewed: 06/08/2016 Elsevier Interactive Patient Education  2017 [employer].

## 2023-06-19 NOTE — Progress Notes (Signed)
B12 is more than normal, can skip a few doses, consider taking every other day. Thyroid levels are normal thanks

## 2023-06-20 ENCOUNTER — Telehealth: Payer: Self-pay | Admitting: Physician Assistant

## 2023-06-20 NOTE — Telephone Encounter (Signed)
Yes she can take it at night if better for her. I would not want to have this med interfere with the HD, I am not familiar with side effects on HD  patients being different that to the non HD patients, thanks. IT is a low dose, should be ok.

## 2023-06-20 NOTE — Telephone Encounter (Signed)
Patients daughter is calling about donepezil 5mg  , is it okay for patient to take while on dialysis and if so she would need to take it 4 hours before , the daughter would like to get some clarity on medication

## 2023-06-20 NOTE — Telephone Encounter (Signed)
Patient advised of medications.

## 2023-06-22 ENCOUNTER — Telehealth: Payer: Self-pay | Admitting: Physician Assistant

## 2023-06-22 NOTE — Telephone Encounter (Signed)
Kristie with [facility] called and left a message with the AN. Caller states they have orders to start PT and OT. She has some stuff going on with her insurance where it is coming out of [place] and its not allowing them to schedule appts. They are not able to start her right away. The patient has yet to call her insurance and she wants to know if they can get a delay

## 2023-06-25 NOTE — Telephone Encounter (Signed)
I advised to Danford Bad [phone] she will contact patient.

## 2023-06-28 ENCOUNTER — Ambulatory Visit (HOSPITAL_BASED_OUTPATIENT_CLINIC_OR_DEPARTMENT_OTHER)
Admission: RE | Admit: 2023-06-28 | Discharge: 2023-06-28 | Disposition: A | Discharge status: Home / Self Care | Payer: 59 | Source: Ambulatory Visit | Attending: Cardiology | Admitting: Cardiology

## 2023-06-28 DIAGNOSIS — E440 Moderate protein-calorie malnutrition: Secondary | ICD-10-CM | POA: Insufficient documentation

## 2023-06-28 DIAGNOSIS — I420 Dilated cardiomyopathy: Secondary | ICD-10-CM | POA: Insufficient documentation

## 2023-06-28 HISTORY — DX: Moderate protein-calorie malnutrition: E44.0

## 2023-06-28 LAB — ECHOCARDIOGRAM COMPLETE
AR max vel: 1.06 cm2
AV Area VTI: 1.08 cm2
AV Area mean vel: 1.09 cm2
AV Mean grad: 8 mmHg
AV Peak grad: 14.6 mmHg
AV Vena cont: 0.4 cm
Ao pk vel: 1.91 m/s
Area-P 1/2: 5.54 cm2
Calc EF: 51.4 %
MV M vel: 4.55 m/s
MV Peak grad: 82.8 mmHg
P 1/2 time: 439 msec
S' Lateral: 3.8 cm
Single Plane A2C EF: 51.6 %
Single Plane A4C EF: 49.8 %

## 2023-06-29 ENCOUNTER — Telehealth: Payer: Self-pay

## 2023-06-29 NOTE — Telephone Encounter (Signed)
Spoke with Ganim, notified of results

## 2023-06-29 NOTE — Telephone Encounter (Signed)
-----   Message from Gypsy Balsam sent at 06/29/2023 10:02 AM EDT ----- Echocardiogram showed normal left ventricle ejection fraction, moderate mitral valve regurgitation, moderate left atrium enlargement all of this for medical therapy overall echocardiogram seems to be slightly better than before

## 2023-07-02 NOTE — Progress Notes (Deleted)
CC: Hospital follow up  HPI:  Ms.Elizabeth Olsen is a 82 y.o. with medical history of HTN, HLD, DMII, CHF, ESRD on HD, atrial fibrillation and chronic right hip pain presenting to [facility] for hospital follow up. She was hospitalized from 08/12-08/15 for HF exacerbation.   Please see problem-based list for further details, assessments, and plans.  Past Medical History:  Diagnosis Date   A-fib (HCC)    Acquired hypothyroidism 10/13/2017   Acute diastolic CHF (congestive heart failure) (HCC) 11/20/2019   Acute respiratory failure with hypoxia (HCC) 10/13/2017   Acute systolic heart failure (HCC) 10/13/2017   Anemia    Anemia associated with diabetes mellitus (HCC) 10/13/2017   Anemia of chronic disease 12/10/2017   Blood transfusion    Blood transfusion without reported diagnosis    with breast surgery   Breast cancer (HCC) 15 years ago   left    Cardiomyopathy (HCC) 07/23/2017   Ejection fraction 45-50% may; from September 2018   CHF (congestive heart failure) (HCC)    Chronic cholecystitis with calculus 01/22/2018   Colon polyp 03/2007   adenomatous   COVID-19 virus infection 11/20/2019   Diabetes mellitus    dx 1998.  was told prior to getting chemo that her bld sugar rose.  She thought it would go back   Dyspnea    d/t anemia   ESRD (end stage renal disease) on dialysis (HCC)    HD MWF   Essential hypertension 09/11/2018   GERD (gastroesophageal reflux disease)    Heart murmur    as child   Hernia, incisional    Hyperlipidemia    Hypertension    Hypothyroidism    Iron deficiency 07/23/2017   Near syncope 04/01/2018   Neuropathy    Osteoarthritis, hip, bilateral    PONV (postoperative nausea and vomiting)    Refractory anemia, unspecified (HCC) 12/10/2017   Short-term memory loss    per pt's daughter   Type 1 diabetes (HCC) 07/23/2017   Vertigo 06/24/2018   Vitamin D deficiency     Current Outpatient Medications (Endocrine & Metabolic):    HUMALOG 100 UNIT/ML  injection, Inject 50-100 Units into the skin See admin instructions. Pump   insulin glargine (LANTUS SOLOSTAR) 100 UNIT/ML Solostar Pen, Inject 1-100 Units into the skin in the morning. Unknown Sliding scale   levothyroxine (SYNTHROID) 88 MCG tablet, Take 1 tablet (88 mcg total) by mouth daily at 6 (six) AM.  Current Outpatient Medications (Cardiovascular):    cloNIDine (CATAPRES) 0.1 MG tablet, Take 1 tablet (0.1 mg total) by mouth daily as needed (FOR SYSTOLIC BLOOD PRESSURE GREATER THAN 170 AND OR DIASTOLIC FOR BLOOD PRESSURE GREATER 100).   metoprolol tartrate (LOPRESSOR) 25 MG tablet, Take 12.5 mg by mouth daily as needed (IF HR more than 100 and syst BP more than 160 per daughter).   Current Outpatient Medications (Analgesics):    oxyCODONE-acetaminophen (PERCOCET) 5-325 MG tablet, Take 1 tablet by mouth every 6 (six) hours as needed for severe pain.  Current Outpatient Medications (Hematological):    apixaban (ELIQUIS) 2.5 MG TABS tablet, Take 1 tablet (2.5 mg total) by mouth 2 (two) times daily.  Current Outpatient Medications (Other):    Continuous Blood Gluc Sensor (FREESTYLE LIBRE 14 DAY SENSOR) MISC, 1 each by Other route See admin instructions.   docusate (COLACE) 50 MG/5ML liquid, Take 5 mLs (50 mg total) by mouth 2 (two) times daily as needed for mild constipation.   donepezil (ARICEPT) 5 MG tablet, Take 1 tablet (  5 mg total) by mouth daily.   Insulin Disposable Pump (OMNIPOD DASH 5 PACK PODS) MISC, SMARTSIG:SUB-Q Every 3 Days   Insulin Syringe-Needle U-100 (ULTICARE INSULIN SYRINGE) 30G X 1/2" 0.5 ML MISC, Please use as previously directed (Patient taking differently: 1 each by Other route See admin instructions. Please use as previously directed)   lidocaine-prilocaine (EMLA) cream, Apply 1 Application topically once.   nystatin (MYCOSTATIN/NYSTOP) powder, Apply topically 2 (two) times daily. (Patient taking differently: Apply 1 Application topically 2 (two) times daily.)    ondansetron (ZOFRAN) 4 MG tablet, Take 1 tablet (4 mg total) by mouth every 8 (eight) hours as needed for nausea or vomiting.   ONETOUCH VERIO test strip, 1 each as needed for other (Glucose).   senna (SENOKOT) 8.6 MG TABS tablet, Take 2 tablets (17.2 mg total) by mouth daily as needed for mild constipation.  Review of Systems:  Review of system negative unless stated in the problem list or HPI.    Physical Exam:  There were no vitals filed for this visit. Physical Exam General: NAD HENT: NCAT Lungs: CTAB, no wheeze, rhonchi or rales.  Cardiovascular: Normal heart sounds, no r/m/g, 2+ pulses in all extremities. No LE edema Abdomen: No TTP, normal bowel sounds MSK: No asymmetry or muscle atrophy.  Skin: no lesions noted on exposed skin Neuro: Alert and oriented x4. CN grossly intact Psych: Normal mood and normal affect   Assessment & Plan:   No problem-specific Assessment & Plan notes found for this encounter.   See Encounters Tab for problem based charting.  Patient Discussed with Dr. {NAMES:[id number]::"Guilloud","Hoffman","Mullen","Narendra","Vincent","Machen","Lau","Hatcher","Williams"} [name], MD [name]. [facility], PGY-3   a.  Hip pain -Improved after receiving HD.    b.  Right-sided pleural effusion: -Likely secondary to volume overload.  No thoracentesis given she was asymptomatic.   c.  CT abdomen and pelvis showed evidence of edema in the right breast, concerning for mastitis. Given patient's prior history of breast cancer consider evaluation with mammogram.   D.  We also discussed PACE with patient and daughter.      2.  Labs / imaging needed at time of follow-up: Mammogram

## 2023-07-03 ENCOUNTER — Encounter: Payer: 59 | Admitting: Internal Medicine

## 2023-07-05 ENCOUNTER — Telehealth: Payer: Self-pay | Admitting: Cardiology

## 2023-07-05 MED ORDER — APIXABAN 2.5 MG PO TABS: 2.5000 mg | ORAL_TABLET | Freq: Two times a day (BID) | ORAL | 1 refills | Status: DC

## 2023-07-05 NOTE — Telephone Encounter (Signed)
*  STAT* If patient is at the pharmacy, call can be transferred to refill team.   1. Which medications need to be refilled? (please list name of each medication and dose if known) apixaban (ELIQUIS) 2.5 MG TABS tablet    2. Would you like to learn more about the convenience, safety, & potential cost savings by using the [facility]? No     3. Are you open to using the [facility] (Type [facility]. No ).   4. Which pharmacy/location (including street and city if local pharmacy) is medication to be sent to? [id number] - [place], [place] - [address] AT [facility] OF [address]    5. Do they need a 30 day or 90 day supply? 90

## 2023-07-05 NOTE — Telephone Encounter (Signed)
Prescription refill request for Eliquis received. Indication: PAF Last office visit: 05/24/23  Kandyce Rud MD Scr: 3.53 on 06/14/23  Epic Age: 82 Weight: 61.3kg  Based on above findings Eliquis 2.5mg  twice daily is the appropriate dose.  Refill approved.

## 2023-07-08 ENCOUNTER — Encounter (HOSPITAL_COMMUNITY): Payer: Self-pay | Admitting: Emergency Medicine

## 2023-07-08 ENCOUNTER — Ambulatory Visit (HOSPITAL_COMMUNITY)
Admission: EM | Admit: 2023-07-08 | Discharge: 2023-07-08 | Disposition: A | Discharge status: Home / Self Care | Payer: 59 | Attending: Internal Medicine | Admitting: Internal Medicine

## 2023-07-08 ENCOUNTER — Other Ambulatory Visit: Payer: Self-pay

## 2023-07-08 DIAGNOSIS — R319 Hematuria, unspecified: Secondary | ICD-10-CM | POA: Diagnosis present

## 2023-07-08 DIAGNOSIS — N390 Urinary tract infection, site not specified: Secondary | ICD-10-CM | POA: Insufficient documentation

## 2023-07-08 LAB — POCT URINALYSIS DIP (MANUAL ENTRY)
Glucose, UA: NEGATIVE mg/dL
Nitrite, UA: NEGATIVE
Protein Ur, POC: >=300 mg/dL — AB
Spec Grav, UA: 1.015 (ref 1.010–1.025)
Urobilinogen, UA: 0.2 U/dL
pH, UA: 5.5 (ref 5.0–8.0)

## 2023-07-08 MED ORDER — FOSFOMYCIN TROMETHAMINE 3 G PO PACK: 3.0000 g | PACK | Freq: Once | ORAL | 0 refills | Status: AC

## 2023-07-08 NOTE — ED Triage Notes (Signed)
Patient's last dialysis was on Friday for a routine session

## 2023-07-08 NOTE — ED Triage Notes (Signed)
Dialysis patient .  Patient noticed blood in toilet and on toilet paper this morning.  CBG was 189 this morning ( this is higher than usual ). Feels need to urinate, but this is not uncommon for her.  The sign of blood is usually an indicator of UTI .

## 2023-07-08 NOTE — ED Provider Notes (Signed)
[facility]    CSN: [id number] Arrival date & time: 07/08/23  1223      History   Chief Complaint No chief complaint on file.   HPI KRISSY MISQUEZ is a 82 y.o. female.   The history is provided by the patient and a relative.  Patient is presenting today with hematuria.  She is accompanied by her daughter.  She has a history of UTIs secondary to oliguria she is currently a dialysis patient's Monday Wednesday and Friday.  Past Medical History:  Diagnosis Date   A-fib (HCC)    Acquired hypothyroidism 10/13/2017   Acute diastolic CHF (congestive heart failure) (HCC) 11/20/2019   Acute respiratory failure with hypoxia (HCC) 10/13/2017   Acute systolic heart failure (HCC) 10/13/2017   Anemia    Anemia associated with diabetes mellitus (HCC) 10/13/2017   Anemia of chronic disease 12/10/2017   Blood transfusion    Blood transfusion without reported diagnosis    with breast surgery   Breast cancer (HCC) 15 years ago   left    Cardiomyopathy (HCC) 07/23/2017   Ejection fraction 45-50% may; from September 2018   CHF (congestive heart failure) (HCC)    Chronic cholecystitis with calculus 01/22/2018   Colon polyp 03/2007   adenomatous   COVID-19 virus infection 11/20/2019   Diabetes mellitus    dx 1998.  was told prior to getting chemo that her bld sugar rose.  She thought it would go back   Dyspnea    d/t anemia   ESRD (end stage renal disease) on dialysis (HCC)    HD MWF   Essential hypertension 09/11/2018   GERD (gastroesophageal reflux disease)    Heart murmur    as child   Hernia, incisional    Hyperlipidemia    Hypertension    Hypothyroidism    Iron deficiency 07/23/2017   Near syncope 04/01/2018   Neuropathy    Osteoarthritis, hip, bilateral    PONV (postoperative nausea and vomiting)    Refractory anemia, unspecified (HCC) 12/10/2017   Short-term memory loss    per pt's daughter   Type 1 diabetes (HCC) 07/23/2017   Vertigo 06/24/2018   Vitamin D  deficiency     Patient Active Problem List   Diagnosis Date Noted   Left hip pain 06/12/2023   Constipation 06/12/2023   Acute exacerbation of CHF (congestive heart failure) (HCC) 06/11/2023   Hypervolemia 06/11/2023   Type 2 diabetes mellitus with chronic kidney disease on chronic dialysis, without long-term current use of insulin (HCC) 06/11/2023   Pleural effusion 05/17/2023   Acute dyspnea 05/16/2023   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   Iron deficiency anemia, unspecified 05/08/2023   Atherosclerotic heart disease of native coronary artery without angina pectoris 05/08/2023   Allergy, unspecified, initial encounter 05/08/2023   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   Atrial fibrillation with RVR (HCC) 05/05/2023   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) 02/14/2021   ESRD (end stage renal disease) (HCC) 09/21/2020   Congestive heart failure (CHF) (HCC) 04/12/2020   Vitamin D deficiency 10/06/2019   Essential hypertension 09/11/2018   Vertigo 06/24/2018   Anemia of chronic disease 12/10/2017   Acquired hypothyroidism 10/13/2017   Type 1 diabetes (HCC) 07/23/2017   Hyperlipidemia 07/23/2017   Iron deficiency 07/23/2017   Cardiomyopathy (HCC) 07/23/2017    Past Surgical History:  Procedure Laterality Date   A/V FISTULAGRAM N/A 05/16/2023   Procedure: A/V Fistulagram;  Surgeon: Victorino Sparrow, MD;  Location: [facility]  INVASIVE CV LAB;  Service: Cardiovascular;  Laterality: N/A;   ABDOMINAL HYSTERECTOMY     APPENDECTOMY     BREAST SURGERY     CAPD REMOVAL N/A 05/31/2023   Procedure: REMOVAL CONTINUOUS AMBULATORY PERITONEAL DIALYSIS  (CAPD) CATHETER;  Surgeon: [name], MD;  Location: [facility] OR;  Service: Vascular;  Laterality: N/A;   CHOLECYSTECTOMY  01/22/2018   LAPROSCOPIC    CHOLECYSTECTOMY N/A 01/22/2018   Procedure: LAPAROSCOPIC CHOLECYSTECTOMY WITH INTRAOPERATIVE CHOLANGIOGRAM;  Surgeon: Manus Rudd, MD;  Location: [facility] OR;   Service: General;  Laterality: N/A;   COLONOSCOPY  2013   due next 03-2017   EYE SURGERY     bil cataracts   IUD REMOVAL     with appendectomy   MASTECTOMY, MODIFIED RADICAL W/RECONSTRUCTION Left 1998   10 nodes out   PERIPHERAL VASCULAR INTERVENTION  05/16/2023   Procedure: PERIPHERAL VASCULAR INTERVENTION;  Surgeon: Victorino Sparrow, MD;  Location: [facility] INVASIVE CV LAB;  Service: Cardiovascular;;   THORACENTESIS Right 05/17/2023   Procedure: THORACENTESIS;  Surgeon: Lorin Glass, MD;  Location: [facility] ENDOSCOPY;  Service: Pulmonary;  Laterality: Right;   TUMOR EXCISION Left    x 2, neck, head    OB History   No obstetric history on file.      Home Medications    Prior to Admission medications   Medication Sig Start Date End Date Taking? Authorizing Provider  fosfomycin (MONUROL) 3 g PACK Take 3 g by mouth once for 1 dose. 07/08/23 07/08/23 Yes Nieko Clarin, Linde Gillis, NP  apixaban (ELIQUIS) 2.5 MG TABS tablet Take 1 tablet (2.5 mg total) by mouth 2 (two) times daily. 07/05/23   Georgeanna Lea, MD  cloNIDine (CATAPRES) 0.1 MG tablet Take 1 tablet (0.1 mg total) by mouth daily as needed (FOR SYSTOLIC BLOOD PRESSURE GREATER THAN 170 AND OR DIASTOLIC FOR BLOOD PRESSURE GREATER 100). 05/18/23   Faith Rogue, DO  Continuous Blood Gluc Sensor (FREESTYLE LIBRE 14 DAY SENSOR) MISC 1 each by Other route See admin instructions. 10/22/19   [provider]  docusate (COLACE) 50 MG/5ML liquid Take 5 mLs (50 mg total) by mouth 2 (two) times daily as needed for mild constipation. 06/14/23   Masters, Katie, DO  donepezil (ARICEPT) 5 MG tablet Take 1 tablet (5 mg total) by mouth daily. 06/19/23   Marcos Eke, PA-C  HUMALOG 100 UNIT/ML injection Inject 50-100 Units into the skin See admin instructions. Pump 02/12/20   [provider]  Insulin Disposable Pump (OMNIPOD DASH 5 PACK PODS) MISC SMARTSIG:SUB-Q Every 3 Days 05/11/20   [provider]  insulin glargine (LANTUS SOLOSTAR) 100  UNIT/ML Solostar Pen Inject 1-100 Units into the skin in the morning. Unknown Sliding scale 01/26/20   [provider]  Insulin Syringe-Needle U-100 (ULTICARE INSULIN SYRINGE) 30G X 1/2" 0.5 ML MISC Please use as previously directed Patient taking differently: 1 each by Other route See admin instructions. Please use as previously directed 05/18/23   Faith Rogue, DO  levothyroxine (SYNTHROID) 88 MCG tablet Take 1 tablet (88 mcg total) by mouth daily at 6 (six) AM. 05/07/23 06/11/23  Morene Crocker, MD  lidocaine-prilocaine (EMLA) cream Apply 1 Application topically once. 04/07/23   [provider]  metoprolol tartrate (LOPRESSOR) 25 MG tablet Take 12.5 mg by mouth daily as needed (IF HR more than 100 and syst BP more than 160 per daughter).    [provider]  nystatin (MYCOSTATIN/NYSTOP) powder Apply topically 2 (two) times daily. Patient taking differently:  Apply 1 Application topically 2 (two) times daily. 05/18/23   Faith Rogue, DO  ondansetron (ZOFRAN) 4 MG tablet Take 1 tablet (4 mg total) by mouth every 8 (eight) hours as needed for nausea or vomiting. 06/14/23   Masters, Florentina Addison, DO  [facility] VERIO test strip 1 each as needed for other (Glucose). 08/24/17   [provider]  oxyCODONE-acetaminophen (PERCOCET) 5-325 MG tablet Take 1 tablet by mouth every 6 (six) hours as needed for severe pain. Patient not taking: Reported on 07/08/2023 05/31/23 05/30/24  Graceann Congress, PA-C  senna (SENOKOT) 8.6 MG TABS tablet Take 2 tablets (17.2 mg total) by mouth daily as needed for mild constipation. 06/14/23   Masters, Florentina Addison, DO    Family History Family History  Problem Relation Age of Onset   Diabetes Other        both sides of family   Hypertension Father    Congestive Heart Failure Father    Peripheral vascular disease Father    Hypertension Maternal Grandfather    Hypertension Maternal Grandmother    Stomach cancer Maternal Grandmother        GGM   Heart attack  Brother    Colon cancer Neg Hx    Esophageal cancer Neg Hx    Pancreatic cancer Neg Hx    Rectal cancer Neg Hx     Social History Social History   Tobacco Use   Smoking status: Never   Smokeless tobacco: Never  Vaping Use   Vaping status: Never Used  Substance Use Topics   Alcohol use: No   Drug use: No     Allergies   Erythromycin, Penicillins, Tape, Doxylamine-dm, Nyquil [pseudoeph-doxylamine-dm-apap], and Sulfa antibiotics   Review of Systems Review of Systems  Genitourinary:  Positive for hematuria.  All other systems reviewed and are negative.    Physical Exam Triage Vital Signs ED Triage Vitals  Encounter Vitals Group     BP      Systolic BP Percentile      Diastolic BP Percentile      Pulse      Resp      Temp      Temp src      SpO2      Weight      Height      Head Circumference      Peak Flow      Pain Score      Pain Loc      Pain Education      Exclude from Growth Chart    No data found.  Updated Vital Signs BP (!) 175/73 (BP Location: Left Arm)   Pulse 76   Temp 97.9 F (36.6 C) (Oral)   Resp 20   SpO2 97%   Visual Acuity Right Eye Distance:   Left Eye Distance:   Bilateral Distance:    Right Eye Near:   Left Eye Near:    Bilateral Near:     Physical Exam Cardiovascular:     Rate and Rhythm: Normal rate and regular rhythm.  Abdominal:     Palpations: Abdomen is soft.     Comments: Nontender suprapubic area,  negative for CVA tenderness  Neurological:     Comments: Baseline dementia  Psychiatric:        Behavior: Behavior normal.      UC Treatments / Results  Labs (all labs ordered are listed, but only abnormal results are displayed) Labs Reviewed  POCT URINALYSIS DIP (MANUAL ENTRY) - Abnormal; Notable for  the following components:      Result Value   Color, UA brown (*)    Clarity, UA cloudy (*)    Bilirubin, UA small (*)    Ketones, POC UA trace (5) (*)    Blood, UA large (*)    Protein Ur, POC >=300 (*)     Leukocytes, UA Small (1+) (*)    All other components within normal limits  URINE CULTURE    EKG   Radiology No results found.  Procedures Procedures (including critical care time)  Medications Ordered in UC Medications - No data to display  Initial Impression / Assessment and Plan / UC Course  I have reviewed the triage vital signs and the nursing notes.  Pertinent labs & imaging results that were available during my care of the patient were reviewed by me and considered in my medical decision making (see chart for details).  Patient is on hemodialysis Monday Wednesday Friday.  She has a history of recurrent UTIs and is currently oliguria.  Urine received in office.  Results show brown cloudy large RBCs negative nitrates but positive for leukocytes.  She has baseline dementia so mentation assessment is skewed.  Patient lives with daughter and daughter states that mental status appears to be at her baseline. Exam was negative for CVA tenderness or suprapubic tenderness hemodynamically stable at this time. Due to previous cultures patient has been on fosfomycin.  Fosfomycin powder, 1 dose is an appropriate choice due to kidney impairment and dialysis creasing antibiotic levels. Patient to follow-up with primary care as needed.   Final Clinical Impressions(s) / UC Diagnoses   Final diagnoses:  Urinary tract infection with hematuria, site unspecified     Discharge Instructions      Take medication as ordered. Urine has been sent for culture Follow-up with your nephrologist at dialysis treatment on Monday to review treatment.     ED Prescriptions     Medication Sig Dispense Auth. Provider   fosfomycin (MONUROL) 3 g PACK Take 3 g by mouth once for 1 dose. 3 g Quanah Majka, Linde Gillis, NP      PDMP not reviewed this encounter.   Nelda Marseille, NP 07/08/23 1515

## 2023-07-08 NOTE — Discharge Instructions (Addendum)
Take medication as ordered. Urine has been sent for culture Follow-up with your nephrologist at dialysis treatment on Monday to review treatment.

## 2023-07-11 LAB — URINE CULTURE: Culture: >=100000 — AB

## 2023-07-13 ENCOUNTER — Encounter (HOSPITAL_COMMUNITY): Payer: Self-pay | Admitting: Emergency Medicine

## 2023-07-13 ENCOUNTER — Emergency Department (HOSPITAL_COMMUNITY)
Admission: EM | Admit: 2023-07-13 | Discharge: 2023-07-13 | Disposition: A | Discharge status: Home / Self Care | Payer: 59 | Attending: Emergency Medicine | Admitting: Emergency Medicine

## 2023-07-13 ENCOUNTER — Emergency Department (HOSPITAL_COMMUNITY): Payer: 59

## 2023-07-13 ENCOUNTER — Other Ambulatory Visit: Payer: Self-pay

## 2023-07-13 DIAGNOSIS — Z794 Long term (current) use of insulin: Secondary | ICD-10-CM | POA: Diagnosis not present

## 2023-07-13 DIAGNOSIS — Z7901 Long term (current) use of anticoagulants: Secondary | ICD-10-CM | POA: Diagnosis not present

## 2023-07-13 DIAGNOSIS — I509 Heart failure, unspecified: Secondary | ICD-10-CM | POA: Insufficient documentation

## 2023-07-13 DIAGNOSIS — E1022 Type 1 diabetes mellitus with diabetic chronic kidney disease: Secondary | ICD-10-CM | POA: Diagnosis not present

## 2023-07-13 DIAGNOSIS — I132 Hypertensive heart and chronic kidney disease with heart failure and with stage 5 chronic kidney disease, or end stage renal disease: Secondary | ICD-10-CM | POA: Diagnosis not present

## 2023-07-13 DIAGNOSIS — N186 End stage renal disease: Secondary | ICD-10-CM | POA: Insufficient documentation

## 2023-07-13 DIAGNOSIS — Z992 Dependence on renal dialysis: Secondary | ICD-10-CM | POA: Insufficient documentation

## 2023-07-13 DIAGNOSIS — R55 Syncope and collapse: Secondary | ICD-10-CM | POA: Diagnosis present

## 2023-07-13 LAB — CBC WITH DIFFERENTIAL/PLATELET
Abs Immature Granulocytes: 0.01 10*3/uL (ref 0.00–0.07)
Basophils Absolute: 0 10*3/uL (ref 0.0–0.1)
Basophils Relative: 1 %
Eosinophils Absolute: 0.1 10*3/uL (ref 0.0–0.5)
Eosinophils Relative: 1 %
HCT: 41.6 % (ref 36.0–46.0)
Hemoglobin: 12.1 g/dL (ref 12.0–15.0)
Immature Granulocytes: 0 %
Lymphocytes Relative: 12 %
Lymphs Abs: 0.8 10*3/uL (ref 0.7–4.0)
MCH: 28 pg (ref 26.0–34.0)
MCHC: 29.1 g/dL — ABNORMAL LOW (ref 30.0–36.0)
MCV: 96.3 fL (ref 80.0–100.0)
Monocytes Absolute: 0.9 10*3/uL (ref 0.1–1.0)
Monocytes Relative: 13 %
Neutro Abs: 5 10*3/uL (ref 1.7–7.7)
Neutrophils Relative %: 73 %
Platelets: 218 10*3/uL (ref 150–400)
RBC: 4.32 MIL/uL (ref 3.87–5.11)
RDW: 15.2 % (ref 11.5–15.5)
WBC: 6.9 10*3/uL (ref 4.0–10.5)
nRBC: 0 % (ref 0.0–0.2)

## 2023-07-13 LAB — COMPREHENSIVE METABOLIC PANEL
ALT: 9 U/L (ref 0–44)
AST: 18 U/L (ref 15–41)
Albumin: 3.6 g/dL (ref 3.5–5.0)
Alkaline Phosphatase: 82 U/L (ref 38–126)
Anion gap: 16 — ABNORMAL HIGH (ref 5–15)
BUN: 29 mg/dL — ABNORMAL HIGH (ref 8–23)
CO2: 28 mmol/L (ref 22–32)
Calcium: 8.4 mg/dL — ABNORMAL LOW (ref 8.9–10.3)
Chloride: 90 mmol/L — ABNORMAL LOW (ref 98–111)
Creatinine, Ser: 3.06 mg/dL — ABNORMAL HIGH (ref 0.44–1.00)
GFR, Estimated: 15 mL/min — ABNORMAL LOW (ref >60)
Glucose, Bld: 218 mg/dL — ABNORMAL HIGH (ref 70–99)
Potassium: 3.8 mmol/L (ref 3.5–5.1)
Sodium: 134 mmol/L — ABNORMAL LOW (ref 135–145)
Total Bilirubin: 0.5 mg/dL (ref 0.3–1.2)
Total Protein: 7.1 g/dL (ref 6.5–8.1)

## 2023-07-13 NOTE — ED Notes (Signed)
Pt in NAD at d/c from ED. A&O. Ambulatory with assistance. Respirations even & unlabored. Skin warm & dry. Pt verbalized understanding of d/c teaching including follow up care and reasons to return to the ED. No needs or questions expressed at d/c.

## 2023-07-13 NOTE — ED Triage Notes (Addendum)
Pt BIB [facility] for a syncopal episode during dialysis.  Staff heard gurgling sound and found pt unresponsive.  She then woke up and was A&O which is her baseline.  EMS states that dialysis got 74/56 BP while on dialysis.  PT states she thought she just fell asleep. EMS removed 1.2 of the 3.3 liters of fluid   EMS got BP of 154/80 HR 80, 100% RA, CBG 105

## 2023-07-13 NOTE — ED Provider Notes (Signed)
[place] EMERGENCY DEPARTMENT AT [facility] Provider Note   CSN: [id number] Arrival date & time: 07/13/23  1447     History  Chief Complaint  Patient presents with   Loss of Consciousness    Elizabeth Olsen is a 82 y.o. female.  HPI  Patient reports that she was in her baseline state of health when she went to dialysis this morning.  She tried to fall asleep she usually does during dialysis in the and the next that she remembers there were people around her saying that she had passed out.    Home Medications Prior to Admission medications   Medication Sig Start Date End Date Taking? Authorizing Provider  apixaban (ELIQUIS) 2.5 MG TABS tablet Take 1 tablet (2.5 mg total) by mouth 2 (two) times daily. 07/05/23   Georgeanna Lea, MD  cloNIDine (CATAPRES) 0.1 MG tablet Take 1 tablet (0.1 mg total) by mouth daily as needed (FOR SYSTOLIC BLOOD PRESSURE GREATER THAN 170 AND OR DIASTOLIC FOR BLOOD PRESSURE GREATER 100). 05/18/23   Faith Rogue, DO  Continuous Blood Gluc Sensor (FREESTYLE LIBRE 14 DAY SENSOR) MISC 1 each by Other route See admin instructions. 10/22/19   [provider]  docusate (COLACE) 50 MG/5ML liquid Take 5 mLs (50 mg total) by mouth 2 (two) times daily as needed for mild constipation. 06/14/23   Masters, Katie, DO  donepezil (ARICEPT) 5 MG tablet Take 1 tablet (5 mg total) by mouth daily. 06/19/23   Marcos Eke, PA-C  HUMALOG 100 UNIT/ML injection Inject 50-100 Units into the skin See admin instructions. Pump 02/12/20   [provider]  Insulin Disposable Pump (OMNIPOD DASH 5 PACK PODS) MISC SMARTSIG:SUB-Q Every 3 Days 05/11/20   [provider]  insulin glargine (LANTUS SOLOSTAR) 100 UNIT/ML Solostar Pen Inject 1-100 Units into the skin in the morning. Unknown Sliding scale 01/26/20   [provider]  Insulin Syringe-Needle U-100 (ULTICARE INSULIN SYRINGE) 30G X 1/2" 0.5 ML MISC Please use as previously directed Patient  taking differently: 1 each by Other route See admin instructions. Please use as previously directed 05/18/23   Faith Rogue, DO  levothyroxine (SYNTHROID) 88 MCG tablet Take 1 tablet (88 mcg total) by mouth daily at 6 (six) AM. 05/07/23 06/11/23  Morene Crocker, MD  lidocaine-prilocaine (EMLA) cream Apply 1 Application topically once. 04/07/23   [provider]  metoprolol tartrate (LOPRESSOR) 25 MG tablet Take 12.5 mg by mouth daily as needed (IF HR more than 100 and syst BP more than 160 per daughter).    [provider]  nystatin (MYCOSTATIN/NYSTOP) powder Apply topically 2 (two) times daily. Patient taking differently: Apply 1 Application topically 2 (two) times daily. 05/18/23   Faith Rogue, DO  ondansetron (ZOFRAN) 4 MG tablet Take 1 tablet (4 mg total) by mouth every 8 (eight) hours as needed for nausea or vomiting. 06/14/23   Masters, Florentina Addison, DO  [facility] VERIO test strip 1 each as needed for other (Glucose). 08/24/17   [provider]  oxyCODONE-acetaminophen (PERCOCET) 5-325 MG tablet Take 1 tablet by mouth every 6 (six) hours as needed for severe pain. Patient not taking: Reported on 07/08/2023 05/31/23 05/30/24  Graceann Congress, PA-C  senna (SENOKOT) 8.6 MG TABS tablet Take 2 tablets (17.2 mg total) by mouth daily as needed for mild constipation. 06/14/23   Masters, Katie, DO      Allergies    Erythromycin, Penicillins, Tape, Doxylamine-dm, Nyquil [pseudoeph-doxylamine-dm-apap], and Sulfa antibiotics    Review of  Systems   Review of Systems  Physical Exam Updated Vital Signs BP (!) 155/67   Pulse 81   Temp 97.7 F (36.5 C) (Oral)   Resp 18   SpO2 100%  Physical Exam Vitals and nursing note reviewed.  Constitutional:      General: She is not in acute distress.    Appearance: She is well-developed.  HENT:     Head: Normocephalic and atraumatic.  Eyes:     Conjunctiva/sclera: Conjunctivae normal.  Cardiovascular:     Rate and Rhythm: Normal rate and  regular rhythm.     Heart sounds: No murmur heard. Pulmonary:     Effort: Pulmonary effort is normal. No respiratory distress.     Breath sounds: Normal breath sounds.  Abdominal:     Palpations: Abdomen is soft.     Tenderness: There is no abdominal tenderness.  Musculoskeletal:        General: No swelling.     Cervical back: Neck supple.  Skin:    General: Skin is warm and dry.     Capillary Refill: Capillary refill takes less than 2 seconds.  Neurological:     General: No focal deficit present.     Mental Status: She is alert and oriented to person, place, and time.     Motor: No weakness.     ED Results / Procedures / Treatments   Labs (all labs ordered are listed, but only abnormal results are displayed) Labs Reviewed  CBC WITH DIFFERENTIAL/PLATELET - Abnormal; Notable for the following components:      Result Value   MCHC 29.1 (*)    All other components within normal limits  COMPREHENSIVE METABOLIC PANEL - Abnormal; Notable for the following components:   Sodium 134 (*)    Chloride 90 (*)    Glucose, Bld 218 (*)    BUN 29 (*)    Creatinine, Ser 3.06 (*)    Calcium 8.4 (*)    GFR, Estimated 15 (*)    Anion gap 16 (*)    All other components within normal limits    EKG EKG Interpretation Date/Time:  Friday July 13 2023 14:57:53 EDT Ventricular Rate:  81 PR Interval:  161 QRS Duration:  107 QT Interval:  432 QTC Calculation: 502 R Axis:   100  Text Interpretation: Sinus rhythm Left posterior fascicular block Prolonged QT interval No significant change since last tracing Confirmed by Melene Plan [phone]) on 07/13/2023 3:03:11 PM  Radiology DG Chest Portable 1 View  Result Date: 07/13/2023 CLINICAL DATA:  Syncope EXAM: PORTABLE CHEST 1 VIEW COMPARISON:  06/11/2023 FINDINGS: Redemonstrated cardiomegaly and central vascular congestion. Previously noted right pleural effusion has decreased, now trace. No definite left pleural effusion. No new focal pulmonary  opacity. No pneumothorax. No acute osseous abnormality. Postoperative changes in the left breast and axilla. IMPRESSION: 1. Redemonstrated cardiomegaly and central vascular congestion. 2. Previously noted right pleural effusion has decreased, now trace. Electronically Signed   By: Wiliam Ke M.D.   On: 07/13/2023 16:44    Procedures Procedures    Medications Ordered in ED Medications - No data to display  ED Course/ Medical Decision Making/ A&P                                 Medical Decision Making Amount and/or Complexity of Data Reviewed Labs: ordered. Radiology: ordered.   Patient is AN 82 year old female with a history of hypertension, CHF,  A-fib on Eliquis, T1DM, ESRD on HD presenting for possible syncope.  On my initial evaluation, she is afebrile, hemodynamically stable, in no acute distress.  She reports that she remembers falling asleep in dialysis so she usually does and then was woken up by people surrounding her.  At the time of waking up she reports that she was at her mental status baseline and was asymptomatic.  On my exam, she has 2+ pulses in all 4 distal extremities, breath sounds clear to auscultation bilaterally.  Will plan for ED evaluation of possible syncope.  Given history of dialysis, will obtain labs to evaluate for electrolyte abnormality.  She has not had fevers or chills, do not suspect that she is bacteremic.  She was recently treated for a UTI, ordered UA to evaluate for success of treatment.  Her episode previously may have been related to having volume removed during dialysis.  She had no chest pain or shortness of breath or palpitations to suggest cardiac syncope.  Results reviewed.  CBC without leukocytosis or anemia.  CMP with minimal hyponatremia, hyperglycemia.  BUN and creatinine elevated, similar compared to baseline.  Patient reassessed.  She has not had any new symptoms on the emergency department.  Throughout her ED course she has reported that  she is currently at her baseline.  Her daughter is now present and reports that patient is at her mental status baseline.  She does not currently note any of the symptoms she usually has a UTI therefore does not desire to wait for this to be collected.  Given resolution of symptoms and reassuring workup, they do desire discharge and I feel this disposition is appropriate at this time.  Return precautions including any recurrence of symptoms, chest pain, shortness of breath, change mental status.  No further acute events under my care in the emergency department.        Final Clinical Impression(s) / ED Diagnoses Final diagnoses:  Syncope, unspecified syncope type    Rx / DC Orders ED Discharge Orders     None         Claretha Cooper, DO 07/13/23 2225    Melene Plan, DO 07/16/23 1506

## 2023-07-16 ENCOUNTER — Encounter: Payer: Self-pay | Admitting: Cardiology

## 2023-07-17 ENCOUNTER — Ambulatory Visit: Payer: 59 | Admitting: Cardiology

## 2023-08-20 ENCOUNTER — Ambulatory Visit: Payer: 59 | Attending: Cardiology | Admitting: Cardiology

## 2023-08-20 ENCOUNTER — Ambulatory Visit: Payer: 59 | Admitting: Physician Assistant

## 2023-08-20 ENCOUNTER — Encounter: Payer: Self-pay | Admitting: Cardiology

## 2023-08-20 VITALS — BP 132/74 | HR 83 | Ht 63.0 in | Wt 132.4 lb

## 2023-08-20 DIAGNOSIS — E1122 Type 2 diabetes mellitus with diabetic chronic kidney disease: Secondary | ICD-10-CM | POA: Diagnosis not present

## 2023-08-20 DIAGNOSIS — I5032 Chronic diastolic (congestive) heart failure: Secondary | ICD-10-CM | POA: Diagnosis not present

## 2023-08-20 DIAGNOSIS — Z992 Dependence on renal dialysis: Secondary | ICD-10-CM

## 2023-08-20 DIAGNOSIS — E782 Mixed hyperlipidemia: Secondary | ICD-10-CM

## 2023-08-20 DIAGNOSIS — I10 Essential (primary) hypertension: Secondary | ICD-10-CM | POA: Diagnosis not present

## 2023-08-20 DIAGNOSIS — I2510 Atherosclerotic heart disease of native coronary artery without angina pectoris: Secondary | ICD-10-CM

## 2023-08-20 DIAGNOSIS — N186 End stage renal disease: Secondary | ICD-10-CM

## 2023-08-20 NOTE — Patient Instructions (Signed)

## 2023-08-20 NOTE — Progress Notes (Unsigned)
Cardiology Office Note:    Date:  08/20/2023   ID:  Elizabeth Olsen, DOB February 25, 1941, MRN [id number]  PCP:  Morene Crocker, MD  Cardiologist:  Gypsy Balsam, MD    Referring MD: [facility]   Chief Complaint  Patient presents with   Follow-up    History of Present Illness:    Elizabeth Olsen is a 82 y.o. female with past medical history significant for diastolic congestive heart failure, coronary artery disease, about 2 years ago she required 3 stenting however have no specific information about that, also essential hypertension, diabetes, chronic renal failure initially on peritoneal dialysis now on hemodialysis.  There is also history of pulmonary hypertension.  Comes today to months for follow-up.  Overall doing well denies have any chest pain tightness squeezing pressure burning chest.  She does what she wants to do with some limitations of his knee.  Past Medical History:  Diagnosis Date   A-fib (HCC)    Acquired hypothyroidism 10/13/2017   Acute diastolic CHF (congestive heart failure) (HCC) 11/20/2019   Acute respiratory failure with hypoxia (HCC) 10/13/2017   Acute systolic heart failure (HCC) 10/13/2017   Anemia    Anemia associated with diabetes mellitus (HCC) 10/13/2017   Anemia of chronic disease 12/10/2017   Blood transfusion    Blood transfusion without reported diagnosis    with breast surgery   Breast cancer (HCC) 15 years ago   left    Cardiomyopathy (HCC) 07/23/2017   Ejection fraction 45-50% may; from September 2018   CHF (congestive heart failure) (HCC)    Chronic cholecystitis with calculus 01/22/2018   Colon polyp 03/2007   adenomatous   COVID-19 virus infection 11/20/2019   Diabetes mellitus    dx 1998.  was told prior to getting chemo that her bld sugar rose.  She thought it would go back   Dyspnea    d/t anemia   ESRD (end stage renal disease) on dialysis (HCC)    HD MWF   Essential hypertension 09/11/2018   GERD  (gastroesophageal reflux disease)    Heart murmur    as child   Hernia, incisional    Hyperlipidemia    Hypertension    Hypothyroidism    Iron deficiency 07/23/2017   Near syncope 04/01/2018   Neuropathy    Osteoarthritis, hip, bilateral    PONV (postoperative nausea and vomiting)    Refractory anemia, unspecified (HCC) 12/10/2017   Short-term memory loss    per pt's daughter   Type 1 diabetes (HCC) 07/23/2017   Vertigo 06/24/2018   Vitamin D deficiency     Past Surgical History:  Procedure Laterality Date   A/V FISTULAGRAM N/A 05/16/2023   Procedure: A/V Fistulagram;  Surgeon: Victorino Sparrow, MD;  Location: [facility] INVASIVE CV LAB;  Service: Cardiovascular;  Laterality: N/A;   ABDOMINAL HYSTERECTOMY     APPENDECTOMY     BREAST SURGERY     CAPD REMOVAL N/A 05/31/2023   Procedure: REMOVAL CONTINUOUS AMBULATORY PERITONEAL DIALYSIS  (CAPD) CATHETER;  Surgeon: [name], MD;  Location: [facility] OR;  Service: Vascular;  Laterality: N/A;   CHOLECYSTECTOMY  01/22/2018   LAPROSCOPIC    CHOLECYSTECTOMY N/A 01/22/2018   Procedure: LAPAROSCOPIC CHOLECYSTECTOMY WITH INTRAOPERATIVE CHOLANGIOGRAM;  Surgeon: Manus Rudd, MD;  Location: [facility] OR;  Service: General;  Laterality: N/A;   COLONOSCOPY  2013   due next 03-2017   EYE SURGERY     bil cataracts   IUD REMOVAL     with appendectomy  MASTECTOMY, MODIFIED RADICAL W/RECONSTRUCTION Left 1998   10 nodes out   PERIPHERAL VASCULAR INTERVENTION  05/16/2023   Procedure: PERIPHERAL VASCULAR INTERVENTION;  Surgeon: Victorino Sparrow, MD;  Location: [facility] INVASIVE CV LAB;  Service: Cardiovascular;;   THORACENTESIS Right 05/17/2023   Procedure: THORACENTESIS;  Surgeon: Lorin Glass, MD;  Location: [facility] ENDOSCOPY;  Service: Pulmonary;  Laterality: Right;   TUMOR EXCISION Left    x 2, neck, head    Current Medications: Current Meds  Medication Sig   apixaban (ELIQUIS) 2.5 MG TABS tablet Take 1 tablet (2.5 mg total) by mouth 2 (two) times daily.    cloNIDine (CATAPRES) 0.1 MG tablet Take 1 tablet (0.1 mg total) by mouth daily as needed (FOR SYSTOLIC BLOOD PRESSURE GREATER THAN 170 AND OR DIASTOLIC FOR BLOOD PRESSURE GREATER 100).   Continuous Blood Gluc Sensor (FREESTYLE LIBRE 14 DAY SENSOR) MISC 1 each by Other route See admin instructions.   docusate (COLACE) 50 MG/5ML liquid Take 5 mLs (50 mg total) by mouth 2 (two) times daily as needed for mild constipation.   donepezil (ARICEPT) 5 MG tablet Take 1 tablet (5 mg total) by mouth daily.   HUMALOG 100 UNIT/ML injection Inject 50-100 Units into the skin See admin instructions. Pump   Insulin Disposable Pump (OMNIPOD DASH 5 PACK PODS) MISC 1 each by Other route daily.   insulin glargine (LANTUS SOLOSTAR) 100 UNIT/ML Solostar Pen Inject 1-100 Units into the skin in the morning. Unknown Sliding scale   Insulin Syringe-Needle U-100 (ULTICARE INSULIN SYRINGE) 30G X 1/2" 0.5 ML MISC Please use as previously directed (Patient taking differently: 1 each by Other route See admin instructions. Please use as previously directed)   levothyroxine (SYNTHROID) 88 MCG tablet Take 1 tablet (88 mcg total) by mouth daily at 6 (six) AM.   lidocaine-prilocaine (EMLA) cream Apply 1 Application topically once.   metoprolol tartrate (LOPRESSOR) 25 MG tablet Take 12.5 mg by mouth daily as needed (IF HR more than 100 and syst BP more than 160 per daughter).   nystatin (MYCOSTATIN/NYSTOP) powder Apply topically 2 (two) times daily. (Patient taking differently: Apply 1 Application topically 2 (two) times daily.)   ondansetron (ZOFRAN) 4 MG tablet Take 1 tablet (4 mg total) by mouth every 8 (eight) hours as needed for nausea or vomiting.   ONETOUCH VERIO test strip 1 each by Other route as needed for other (Glucose).   oxyCODONE-acetaminophen (PERCOCET) 5-325 MG tablet Take 1 tablet by mouth every 6 (six) hours as needed for severe pain.   senna (SENOKOT) 8.6 MG TABS tablet Take 2 tablets (17.2 mg total) by mouth daily  as needed for mild constipation.     Allergies:   Erythromycin, Penicillins, Tape, Doxylamine-dm, Nyquil [pseudoeph-doxylamine-dm-apap], and Sulfa antibiotics   Social History   Socioeconomic History   Marital status: Widowed    Spouse name: Not on file   Number of children: 2   Years of education: Not on file   Highest education level: Not on file  Occupational History   Occupation: homemaker  Tobacco Use   Smoking status: Never   Smokeless tobacco: Never  Vaping Use   Vaping status: Never Used  Substance and Sexual Activity   Alcohol use: No   Drug use: No   Sexual activity: Not on file  Other Topics Concern   Not on file  Social History Narrative   1 son is deceased. Husband is deceased   Social Determinants of Corporate investment banker Strain: Not  on file  Food Insecurity: No Food Insecurity (06/12/2023)   Hunger Vital Sign    Worried About Running Out of Food in the Last Year: Never true    Ran Out of Food in the Last Year: Never true  Transportation Needs: No Transportation Needs (06/12/2023)   PRAPARE - Administrator, Civil Service (Medical): No    Lack of Transportation (Non-Medical): No  Physical Activity: Not on file  Stress: Not on file  Social Connections: Not on file     Family History: The patient's family history includes Congestive Heart Failure in her father; Diabetes in an other family member; Heart attack in her brother; Hypertension in her father, maternal grandfather, and maternal grandmother; Peripheral vascular disease in her father; Stomach cancer in her maternal grandmother. There is no history of Colon cancer, Esophageal cancer, Pancreatic cancer, or Rectal cancer. ROS:   Please see the history of present illness.    All 14 point review of systems negative except as described per history of present illness  EKGs/Labs/Other Studies Reviewed:         Recent Labs: 06/11/2023: B Natriuretic Peptide 1,086.3; Magnesium  2.2 06/19/2023: TSH 2.87 07/13/2023: ALT 9; BUN 29; Creatinine, Ser 3.06; Hemoglobin 12.1; Platelets 218; Potassium 3.8; Sodium 134  Recent Lipid Panel    Component Value Date/Time   CHOL 184 07/23/2017 1538   TRIG 91 07/23/2017 1538   HDL 77 07/23/2017 1538   CHOLHDL 2.4 07/23/2017 1538   LDLCALC 89 07/23/2017 1538    Physical Exam:    VS:  BP 132/74 (BP Location: Left Arm, Patient Position: Sitting)   Pulse 83   Ht 5\' 3"  (1.6 m)   Wt 132 lb 6.4 oz (60.1 kg)   SpO2 95%   BMI 23.45 kg/m     Wt Readings from Last 3 Encounters:  08/20/23 132 lb 6.4 oz (60.1 kg)  06/19/23 132 lb (59.9 kg)  06/14/23 122 lb 11.2 oz (55.7 kg)     GEN:  Well nourished, well developed in no acute distress HEENT: Normal NECK: No JVD; No carotid bruits LYMPHATICS: No lymphadenopathy CARDIAC: RRR, no murmurs, no rubs, no gallops RESPIRATORY:  Clear to auscultation without rales, wheezing or rhonchi  ABDOMEN: Soft, non-tender, non-distended MUSCULOSKELETAL:  No edema; No deformity  SKIN: Warm and dry LOWER EXTREMITIES: no swelling NEUROLOGIC:  Alert and oriented x 3 PSYCHIATRIC:  Normal affect   ASSESSMENT:    1. Atherosclerosis of native coronary artery of native heart without angina pectoris   2. Essential hypertension   3. Chronic diastolic congestive heart failure (HCC)   4. Type 2 diabetes mellitus with chronic kidney disease on chronic dialysis, without long-term current use of insulin (HCC)   5. Mixed hyperlipidemia    PLAN:    In order of problems listed above:  Coronary disease stable from that point review on appropriate medication which I will continue. Essential hypertension blood pressure well-controlled we will continue present management. Dyslipidemia I did review K PN which show me LDL 72 HDL 71 we will continue present management. Type 2 diabetes followed by antimedicine team I do have hemoglobin A1c from summer of this year which was 5.2 excellent control we will continue  present management. Pulmonary hypertension: Echocardiogram reviewed from just few months ago showing normal pulm artery pressure.  Will continue monitoring I suspect previously pulmonary hypertension could be related to fluid overload   Medication Adjustments/Labs and Tests Ordered: Current medicines are reviewed at length with the patient  today.  Concerns regarding medicines are outlined above.  No orders of the defined types were placed in this encounter.  Medication changes: No orders of the defined types were placed in this encounter.   Signed, Georgeanna Lea, MD, [facility] 08/20/2023 9:33 AM    [facility]

## 2023-08-21 ENCOUNTER — Other Ambulatory Visit (HOSPITAL_COMMUNITY): Payer: Self-pay | Admitting: Nephrology

## 2023-08-21 DIAGNOSIS — N186 End stage renal disease: Secondary | ICD-10-CM

## 2023-08-22 ENCOUNTER — Encounter: Payer: Self-pay | Admitting: Hematology

## 2023-08-27 ENCOUNTER — Other Ambulatory Visit (HOSPITAL_COMMUNITY): Payer: Self-pay | Admitting: Student

## 2023-08-28 ENCOUNTER — Ambulatory Visit (HOSPITAL_COMMUNITY)
Admission: RE | Admit: 2023-08-28 | Discharge: 2023-08-28 | Disposition: A | Discharge status: Home / Self Care | Payer: Medicaid Other | Source: Ambulatory Visit | Attending: Nephrology | Admitting: Nephrology

## 2023-08-28 ENCOUNTER — Encounter (HOSPITAL_COMMUNITY): Payer: Self-pay

## 2023-09-06 ENCOUNTER — Encounter: Payer: Self-pay | Admitting: Hematology

## 2023-09-10 ENCOUNTER — Other Ambulatory Visit: Payer: Self-pay | Admitting: Student

## 2023-09-10 ENCOUNTER — Other Ambulatory Visit: Payer: Self-pay | Admitting: Radiology

## 2023-09-11 ENCOUNTER — Encounter (HOSPITAL_COMMUNITY): Payer: Self-pay

## 2023-09-11 ENCOUNTER — Ambulatory Visit (HOSPITAL_COMMUNITY)
Admission: RE | Admit: 2023-09-11 | Discharge: 2023-09-11 | Disposition: A | Discharge status: Home / Self Care | Payer: 59 | Source: Ambulatory Visit | Attending: Nephrology | Admitting: Nephrology

## 2023-09-11 ENCOUNTER — Other Ambulatory Visit (HOSPITAL_COMMUNITY): Payer: Self-pay | Admitting: Nephrology

## 2023-09-11 ENCOUNTER — Other Ambulatory Visit: Payer: Self-pay

## 2023-09-11 DIAGNOSIS — I132 Hypertensive heart and chronic kidney disease with heart failure and with stage 5 chronic kidney disease, or end stage renal disease: Secondary | ICD-10-CM | POA: Diagnosis not present

## 2023-09-11 DIAGNOSIS — Z992 Dependence on renal dialysis: Secondary | ICD-10-CM | POA: Insufficient documentation

## 2023-09-11 DIAGNOSIS — N186 End stage renal disease: Secondary | ICD-10-CM

## 2023-09-11 DIAGNOSIS — I5042 Chronic combined systolic (congestive) and diastolic (congestive) heart failure: Secondary | ICD-10-CM | POA: Diagnosis not present

## 2023-09-11 DIAGNOSIS — Z794 Long term (current) use of insulin: Secondary | ICD-10-CM | POA: Insufficient documentation

## 2023-09-11 DIAGNOSIS — Y841 Kidney dialysis as the cause of abnormal reaction of the patient, or of later complication, without mention of misadventure at the time of the procedure: Secondary | ICD-10-CM | POA: Diagnosis not present

## 2023-09-11 DIAGNOSIS — T82858A Stenosis of vascular prosthetic devices, implants and grafts, initial encounter: Secondary | ICD-10-CM | POA: Diagnosis present

## 2023-09-11 DIAGNOSIS — E1122 Type 2 diabetes mellitus with diabetic chronic kidney disease: Secondary | ICD-10-CM | POA: Insufficient documentation

## 2023-09-11 HISTORY — PX: IR US GUIDE VASC ACCESS RIGHT: IMG2390

## 2023-09-11 HISTORY — PX: IR DIALY SHUNT INTRO NEEDLE/INTRACATH INITIAL W/IMG RIGHT: IMG6115

## 2023-09-11 HISTORY — PX: IR THROMBECTOMY AV FISTULA W/THROMBOLYSIS/PTA INC/SHUNT/IMG RIGHT: IMG6119

## 2023-09-11 LAB — GLUCOSE, CAPILLARY: Glucose-Capillary: 71 mg/dL (ref 70–99)

## 2023-09-11 MED ORDER — HEPARIN SODIUM (PORCINE) 1000 UNIT/ML IJ SOLN
INTRAMUSCULAR | Status: AC
  Filled 2023-09-11: qty 10

## 2023-09-11 MED ORDER — FENTANYL CITRATE (PF) 100 MCG/2ML IJ SOLN
INTRAMUSCULAR | Status: AC
  Filled 2023-09-11: qty 2

## 2023-09-11 MED ORDER — IOHEXOL 300 MG/ML  SOLN
100.0000 mL | Freq: Once | INTRAMUSCULAR | Status: AC | PRN
  Administered 2023-09-11: 35 mL via INTRAVENOUS

## 2023-09-11 MED ORDER — CLONIDINE HCL 0.1 MG PO TABS
0.1000 mg | ORAL_TABLET | Freq: Once | ORAL | Status: AC
  Administered 2023-09-11: 0.1 mg via ORAL
  Filled 2023-09-11: qty 1

## 2023-09-11 MED ORDER — LIDOCAINE HCL 1 % IJ SOLN
INTRAMUSCULAR | Status: AC
  Filled 2023-09-11: qty 20

## 2023-09-11 MED ORDER — OXYCODONE HCL 5 MG PO TABS: 5.0000 mg | ORAL_TABLET | ORAL | Status: DC | PRN

## 2023-09-11 MED ORDER — LIDOCAINE HCL 1 % IJ SOLN
20.0000 mL | Freq: Once | INTRAMUSCULAR | Status: AC
  Administered 2023-09-11: 10 mL via INTRADERMAL

## 2023-09-11 MED ORDER — MIDAZOLAM HCL 2 MG/2ML IJ SOLN
INTRAMUSCULAR | Status: AC | PRN
Start: 2023-09-11 — End: 2023-09-11
  Administered 2023-09-11 (×2): .5 mg via INTRAVENOUS

## 2023-09-11 MED ORDER — HEPARIN SODIUM (PORCINE) 1000 UNIT/ML IJ SOLN: 10000.0000 [IU] | Freq: Once | INTRAMUSCULAR | Status: DC

## 2023-09-11 MED ORDER — MIDAZOLAM HCL 2 MG/2ML IJ SOLN
INTRAMUSCULAR | Status: AC
  Filled 2023-09-11: qty 2

## 2023-09-11 MED ORDER — FENTANYL CITRATE (PF) 100 MCG/2ML IJ SOLN
INTRAMUSCULAR | Status: AC | PRN
Start: 2023-09-11 — End: 2023-09-11
  Administered 2023-09-11: 25 ug via INTRAVENOUS

## 2023-09-11 NOTE — Procedures (Signed)
Vascular and Interventional Radiology Procedure Note  Patient: Elizabeth Olsen DOB: 11-29-1940 Medical Record Number: [id number] Note Date/Time: 09/11/23 5:02 PM   Performing Physician: Roanna Banning, MD Assistant(s): None  Diagnosis: Malfunctioning HD fistula   Procedure:  RIGHT UPPER EXTREMITY DIALYSIS CIRCUIT STUDY, including CENTRAL VENOGRAM PTA of (BRACHIOCEPHALIC) CENTRAL VEIN STENOSIS   Anesthesia: Conscious Sedation Complications: None Estimated Blood Loss: Minimal Specimens: None   Findings:  - RUE BB AVF, transposed.  - Widely patent AA and cannulation segment on POC [place]. 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   Plan: - Purse string at RUE, to be removed at next HD. Fistula OK to use. - continue to monitor access flows and exam. - Will follow Pt for possible re-evaluation in 6 months.  Final report to follow once all images are reviewed and compared with previous studies.  See detailed dictation with images in PACS. The patient tolerated the procedure well without incident or complication and was returned to Recovery in stable condition.    Roanna Banning, MD [facility] Radiology   Pager. [phone] Clinic. [phone]

## 2023-09-11 NOTE — H&P (Signed)
Chief Complaint: Patient was seen in consultation today for dialysis fistula evaluation/intervention with possible tunneled catheter placement at the request of Upton,Elizabeth Olsen  Supervising Physician: Roanna Banning  Patient Status: [facility] - Out-pt  History of Present Illness: Elizabeth Olsen Olsen is a 82 y.o. female   FULL Code per chart and patient  Patient is followed by Elizabeth Holmes, NP  Patient is ESRD, with a MWF dialysis schedule, last dialysis fully completed yesterday. Fistula was noted with falling AF at right fistula upon access. IR was consulted for fistula evaluation.  Patient is scheduled today for dialysis fistula evaluation/intervention with possible tunneled catheter placement  Past Medical History:  Diagnosis Date   A-fib (HCC)    Acquired hypothyroidism 10/13/2017   Acute diastolic CHF (congestive heart failure) (HCC) 11/20/2019   Acute respiratory failure with hypoxia (HCC) 10/13/2017   Acute systolic heart failure (HCC) 10/13/2017   Anemia    Anemia associated with diabetes mellitus (HCC) 10/13/2017   Anemia of chronic disease 12/10/2017   Blood transfusion    Blood transfusion without reported diagnosis    with breast surgery   Breast cancer (HCC) 15 years ago   left    Cardiomyopathy (HCC) 07/23/2017   Ejection fraction 45-50% may; from September 2018   CHF (congestive heart failure) (HCC)    Chronic cholecystitis with calculus 01/22/2018   Colon polyp 03/2007   adenomatous   COVID-19 virus infection 11/20/2019   Diabetes mellitus    dx 1998.  was told prior to getting chemo that her bld sugar rose.  She thought it would go back   Dyspnea    d/t anemia   ESRD (end stage renal disease) on dialysis (HCC)    HD MWF   Essential hypertension 09/11/2018   GERD (gastroesophageal reflux disease)    Heart murmur    as child   Hernia, incisional    Hyperlipidemia    Hypertension    Hypothyroidism    Iron deficiency 07/23/2017   Near syncope  04/01/2018   Neuropathy    Osteoarthritis, hip, bilateral    PONV (postoperative nausea and vomiting)    Refractory anemia, unspecified (HCC) 12/10/2017   Short-term memory loss    per pt's daughter   Type 1 diabetes (HCC) 07/23/2017   Vertigo 06/24/2018   Vitamin D deficiency     Past Surgical History:  Procedure Laterality Date   A/V FISTULAGRAM N/A 05/16/2023   Procedure: A/V Fistulagram;  Surgeon: Victorino Sparrow, MD;  Location: [facility] INVASIVE CV LAB;  Service: Cardiovascular;  Laterality: N/A;   ABDOMINAL HYSTERECTOMY     APPENDECTOMY     BREAST SURGERY     CAPD REMOVAL N/A 05/31/2023   Procedure: REMOVAL CONTINUOUS AMBULATORY PERITONEAL DIALYSIS  (CAPD) CATHETER;  Surgeon: [name], MD;  Location: [facility] OR;  Service: Vascular;  Laterality: N/A;   CHOLECYSTECTOMY  01/22/2018   LAPROSCOPIC    CHOLECYSTECTOMY N/A 01/22/2018   Procedure: LAPAROSCOPIC CHOLECYSTECTOMY WITH INTRAOPERATIVE CHOLANGIOGRAM;  Surgeon: Manus Rudd, MD;  Location: [facility] OR;  Service: General;  Laterality: N/A;   COLONOSCOPY  2013   due next 03-2017   EYE SURGERY     bil cataracts   IUD REMOVAL     with appendectomy   MASTECTOMY, MODIFIED RADICAL W/RECONSTRUCTION Left 1998   10 nodes out   PERIPHERAL VASCULAR INTERVENTION  05/16/2023   Procedure: PERIPHERAL VASCULAR INTERVENTION;  Surgeon: Victorino Sparrow, MD;  Location: [facility] INVASIVE CV LAB;  Service: Cardiovascular;;   THORACENTESIS Right 05/17/2023  Procedure: THORACENTESIS;  Surgeon: Lorin Glass, MD;  Location: [facility] ENDOSCOPY;  Service: Pulmonary;  Laterality: Right;   TUMOR EXCISION Left    x 2, neck, head    Allergies: Erythromycin, Penicillins, Tape, Doxylamine-dm, Nyquil [pseudoeph-doxylamine-dm-apap], and Sulfa antibiotics  Medications: Prior to Admission medications   Medication Sig Start Date End Date Taking? Authorizing Provider  apixaban (ELIQUIS) 2.5 MG TABS tablet Take 1 tablet (2.5 mg total) by mouth 2 (two) times daily. 07/05/23   Yes Georgeanna Lea, MD  cloNIDine (CATAPRES) 0.1 MG tablet Take 1 tablet (0.1 mg total) by mouth daily as needed (FOR SYSTOLIC BLOOD PRESSURE GREATER THAN 170 AND OR DIASTOLIC FOR BLOOD PRESSURE GREATER 100). 05/18/23  Yes Bender, Emily, DO  donepezil (ARICEPT) 5 MG tablet Take 1 tablet (5 mg total) by mouth daily. 06/19/23  Yes Marcos Eke, PA-C  levothyroxine (SYNTHROID) 88 MCG tablet TAKE 1 TABLET(88 MCG) BY MOUTH DAILY AT 6 AM 08/21/23 08/20/24 Yes Morene Crocker, MD  Continuous Blood Gluc Sensor (FREESTYLE LIBRE 14 DAY SENSOR) MISC 1 each by Other route See admin instructions. 10/22/19   [provider]  docusate (COLACE) 50 MG/5ML liquid Take 5 mLs (50 mg total) by mouth 2 (two) times daily as needed for mild constipation. 06/14/23   Masters, Katie, DO  HUMALOG 100 UNIT/ML injection Inject 50-100 Units into the skin See admin instructions. Pump 02/12/20   [provider]  Insulin Disposable Pump (OMNIPOD DASH 5 PACK PODS) MISC 1 each by Other route daily. 05/11/20   [provider]  insulin glargine (LANTUS SOLOSTAR) 100 UNIT/ML Solostar Pen Inject 1-100 Units into the skin in the morning. Unknown Sliding scale 01/26/20   [provider]  Insulin Syringe-Needle U-100 (ULTICARE INSULIN SYRINGE) 30G X 1/2" 0.5 ML MISC Please use as previously directed Patient taking differently: 1 each by Other route See admin instructions. Please use as previously directed 05/18/23   Faith Rogue, DO  lidocaine-prilocaine (EMLA) cream Apply 1 Application topically once. 04/07/23   [provider]  metoprolol tartrate (LOPRESSOR) 25 MG tablet Take 12.5 mg by mouth daily as needed (IF HR more than 100 and syst BP more than 160 per daughter).    [provider]  nystatin (MYCOSTATIN/NYSTOP) powder Apply topically 2 (two) times daily. Patient taking differently: Apply 1 Application topically 2 (two) times daily. 05/18/23   Faith Rogue, DO  ondansetron  (ZOFRAN) 4 MG tablet Take 1 tablet (4 mg total) by mouth every 8 (eight) hours as needed for nausea or vomiting. 06/14/23   Masters, Florentina Addison, DO  [facility] VERIO test strip 1 each by Other route as needed for other (Glucose). 08/24/17   [provider]  oxyCODONE-acetaminophen (PERCOCET) 5-325 MG tablet Take 1 tablet by mouth every 6 (six) hours as needed for severe pain. 05/31/23 05/30/24  Baglia, Corrina, PA-C  senna (SENOKOT) 8.6 MG TABS tablet Take 2 tablets (17.2 mg total) by mouth daily as needed for mild constipation. 06/14/23   Masters, Florentina Addison, DO     Family History  Problem Relation Age of Onset   Diabetes Other        both sides of family   Hypertension Father    Congestive Heart Failure Father    Peripheral vascular disease Father    Hypertension Maternal Grandfather    Hypertension Maternal Grandmother    Stomach cancer Maternal Grandmother        GGM   Heart attack Brother    Colon cancer Neg Hx  Esophageal cancer Neg Hx    Pancreatic cancer Neg Hx    Rectal cancer Neg Hx     Social History   Socioeconomic History   Marital status: Widowed    Spouse name: Not on file   Number of children: 2   Years of education: Not on file   Highest education level: Not on file  Occupational History   Occupation: homemaker  Tobacco Use   Smoking status: Never   Smokeless tobacco: Never  Vaping Use   Vaping status: Never Used  Substance and Sexual Activity   Alcohol use: No   Drug use: No   Sexual activity: Not on file  Other Topics Concern   Not on file  Social History Narrative   1 son is deceased. Husband is deceased   Social Determinants of Corporate investment banker Strain: Not on file  Food Insecurity: No Food Insecurity (06/12/2023)   Hunger Vital Sign    Worried About Running Out of Food in the Last Year: Never true    Ran Out of Food in the Last Year: Never true  Transportation Needs: No Transportation Needs (06/12/2023)   PRAPARE - Doctor, general practice (Medical): No    Lack of Transportation (Non-Medical): No  Physical Activity: Not on file  Stress: Not on file  Social Connections: Not on file    Review of Systems: A 12 point ROS discussed and pertinent positives are indicated in the HPI above.  All other systems are negative.  Review of Systems  Constitutional: Negative.  Negative for appetite change and fever.  Respiratory:  Negative for chest tightness, shortness of breath and stridor.   Cardiovascular:  Negative for chest pain.  Skin:  Negative for color change.  Psychiatric/Behavioral:  Negative for behavioral problems and confusion.     Vital Signs: BP (!) 164/60   Pulse 71   Temp 97.7 F (36.5 C) (Oral)   Resp 18   Ht 5\' 3"  (1.6 m)   Wt 117 lb (53.1 kg)   SpO2 98%   BMI 20.73 kg/m   Advance Care Plan: The advanced care plan/surrogate decision maker was discussed at the time of visit and documented in the medical record.    Physical Exam Constitutional:      General: She is not in acute distress.    Appearance: Normal appearance.  HENT:     Mouth/Throat:     Mouth: Mucous membranes are moist.  Cardiovascular:     Rate and Rhythm: Normal rate and regular rhythm.     Heart sounds: Murmur heard.  Pulmonary:     Effort: Pulmonary effort is normal. No respiratory distress.     Breath sounds: Normal breath sounds.  Musculoskeletal:        General: Normal range of motion.  Skin:    General: Skin is warm and dry.  Neurological:     Mental Status: She is alert and oriented to person, place, and time.  Psychiatric:        Mood and Affect: Mood normal.        Behavior: Behavior normal.        Thought Content: Thought content normal.        Judgment: Judgment normal.     Imaging: No results found.  Labs:  CBC: Recent Labs    06/13/23 0718 06/13/23 0819 06/14/23 0058 07/13/23 1609  WBC 13.6* 13.2* 10.5 6.9  HGB 10.9* 10.6* 11.9* 12.1  HCT 35.4* 34.1* 39.5  41.6  PLT 318 323 348 218     COAGS: No results for input(s): "INR", "APTT" in the last 8760 hours.  BMP: Recent Labs    06/13/23 0718 06/13/23 0918 06/14/23 0058 07/13/23 1609  NA 132* 134* 134* 134*  K 4.9 4.2 4.2 3.8  CL 90* 92* 91* 90*  CO2 31 28 29 28   GLUCOSE 96 121* 163* 218*  BUN 46* 48* 27* 29*  CALCIUM 8.8* 8.8* 9.3 8.4*  CREATININE 4.82* 4.93* 3.53* 3.06*  GFRNONAA 9* 8* 12* 15*    LIVER FUNCTION TESTS: Recent Labs    05/17/23 0145 05/18/23 0831 05/22/23 1422 06/11/23 1210 06/13/23 0718 06/13/23 0918 07/13/23 1609  BILITOT 1.2  --  0.6 1.0  --   --  0.5  AST 24  --  20 66*  --   --  18  ALT 12  --  13 38  --   --  9  ALKPHOS 84  --  64 112  --   --  82  PROT 6.9  --  6.0* 6.7  --   --  7.1  ALBUMIN 3.5   < > 3.0* 3.4* 3.0* 2.9* 3.6   < > = values in this interval not displayed.    TUMOR MARKERS: No results for input(s): "AFPTM", "CEA", "CA199", "CHROMGRNA" in the last 8760 hours.  Assessment and Plan:  Patient presents for dialysis fistula evaluation/intervention with possible tunneled catheter placement Risks and benefits discussed with the patient including, but not limited to bleeding, infection, vascular injury, pulmonary embolism, need for tunneled HD catheter placement or even death.  All of the patient's questions were answered, patient is agreeable to proceed. Consent signed and in chart.    Thank you for this interesting consult.  I greatly enjoyed meeting NURIYAH BALFOUR and look forward to participating in their care.  A copy of this report was sent to the requesting provider on this date.  Electronically Signed: Sable Feil, PA-C 09/11/2023, 8:34 AM   I spent a total of  30 Minutes   in face to face in clinical consultation, greater than 50% of which was counseling/coordinating care for dialysis fistula evaluation/intervention with possible tunneled catheter placement

## 2023-09-12 DIAGNOSIS — Z4802 Encounter for removal of sutures: Secondary | ICD-10-CM | POA: Insufficient documentation

## 2023-09-12 HISTORY — DX: Encounter for removal of sutures: Z48.02

## 2023-09-13 ENCOUNTER — Encounter (HOSPITAL_COMMUNITY): Payer: Self-pay

## 2023-10-04 ENCOUNTER — Encounter: Payer: Self-pay | Admitting: Hematology

## 2023-10-06 ENCOUNTER — Encounter: Payer: Self-pay | Admitting: Hematology

## 2023-10-11 ENCOUNTER — Encounter: Payer: Self-pay | Admitting: Hematology

## 2023-10-11 ENCOUNTER — Ambulatory Visit: Payer: 59 | Admitting: Emergency Medicine

## 2023-10-16 ENCOUNTER — Encounter: Payer: Self-pay | Admitting: Emergency Medicine

## 2023-10-16 ENCOUNTER — Encounter: Payer: Self-pay | Admitting: Hematology

## 2023-10-16 ENCOUNTER — Ambulatory Visit (INDEPENDENT_AMBULATORY_CARE_PROVIDER_SITE_OTHER): Payer: 59 | Admitting: Emergency Medicine

## 2023-10-16 VITALS — BP 132/78 | HR 71 | Temp 98.1°F | Ht 63.0 in | Wt 134.2 lb

## 2023-10-16 DIAGNOSIS — I5042 Chronic combined systolic (congestive) and diastolic (congestive) heart failure: Secondary | ICD-10-CM

## 2023-10-16 DIAGNOSIS — I10 Essential (primary) hypertension: Secondary | ICD-10-CM | POA: Diagnosis not present

## 2023-10-16 DIAGNOSIS — N186 End stage renal disease: Secondary | ICD-10-CM

## 2023-10-16 DIAGNOSIS — Z7689 Persons encountering health services in other specified circumstances: Secondary | ICD-10-CM

## 2023-10-16 DIAGNOSIS — I2510 Atherosclerotic heart disease of native coronary artery without angina pectoris: Secondary | ICD-10-CM

## 2023-10-16 DIAGNOSIS — Z992 Dependence on renal dialysis: Secondary | ICD-10-CM

## 2023-10-16 DIAGNOSIS — E039 Hypothyroidism, unspecified: Secondary | ICD-10-CM

## 2023-10-16 DIAGNOSIS — T7840XA Allergy, unspecified, initial encounter: Secondary | ICD-10-CM

## 2023-10-16 DIAGNOSIS — I4891 Unspecified atrial fibrillation: Secondary | ICD-10-CM | POA: Diagnosis not present

## 2023-10-16 DIAGNOSIS — E1122 Type 2 diabetes mellitus with diabetic chronic kidney disease: Secondary | ICD-10-CM

## 2023-10-16 DIAGNOSIS — Z794 Long term (current) use of insulin: Secondary | ICD-10-CM

## 2023-10-16 DIAGNOSIS — D638 Anemia in other chronic diseases classified elsewhere: Secondary | ICD-10-CM

## 2023-10-16 MED ORDER — DEXCOM G6 SENSOR MISC: 4 refills | Status: DC

## 2023-10-16 MED ORDER — LIDOCAINE-PRILOCAINE 2.5-2.5 % EX CREA: 1.0000 | TOPICAL_CREAM | Freq: Once | CUTANEOUS | 3 refills | Status: AC

## 2023-10-16 NOTE — Assessment & Plan Note (Signed)
Clinically euthyroid.  Continue Synthroid 88 mcg daily.

## 2023-10-16 NOTE — Assessment & Plan Note (Signed)
With history of stents in the past Sees cardiologist on a regular basis. Continues Eliquis 2.5 mg twice a day and metoprolol tartrate 12.5 mg daily as needed

## 2023-10-16 NOTE — Assessment & Plan Note (Signed)
Isolated episode related to high dose of Synthroid No recent episodes

## 2023-10-16 NOTE — Patient Instructions (Signed)
Health Maintenance After Age 81 After age 81, you are at a higher risk for certain long-term diseases and infections as well as injuries from falls. Falls are a major cause of broken bones and head injuries in people who are older than age 81. Getting regular preventive care can help to keep you healthy and well. Preventive care includes getting regular testing and making lifestyle changes as recommended by your health care provider. Talk with your health care provider about: Which screenings and tests you should have. A screening is a test that checks for a disease when you have no symptoms. A diet and exercise plan that is right for you. What should I know about screenings and tests to prevent falls? Screening and testing are the best ways to find a health problem early. Early diagnosis and treatment give you the best chance of managing medical conditions that are common after age 81. Certain conditions and lifestyle choices may make you more likely to have a fall. Your health care provider may recommend: Regular vision checks. Poor vision and conditions such as cataracts can make you more likely to have a fall. If you wear glasses, make sure to get your prescription updated if your vision changes. Medicine review. Work with your health care provider to regularly review all of the medicines you are taking, including over-the-counter medicines. Ask your health care provider about any side effects that may make you more likely to have a fall. Tell your health care provider if any medicines that you take make you feel dizzy or sleepy. Strength and balance checks. Your health care provider may recommend certain tests to check your strength and balance while standing, walking, or changing positions. Foot health exam. Foot pain and numbness, as well as not wearing proper footwear, can make you more likely to have a fall. Screenings, including: Osteoporosis screening. Osteoporosis is a condition that causes  the bones to get weaker and break more easily. Blood pressure screening. Blood pressure changes and medicines to control blood pressure can make you feel dizzy. Depression screening. You may be more likely to have a fall if you have a fear of falling, feel depressed, or feel unable to do activities that you used to do. Alcohol use screening. Using too much alcohol can affect your balance and may make you more likely to have a fall. Follow these instructions at home: Lifestyle Do not drink alcohol if: Your health care provider tells you not to drink. If you drink alcohol: Limit how much you have to: 0-1 drink a day for women. 0-2 drinks a day for men. Know how much alcohol is in your drink. In the [place] one drink equals one 12 oz bottle of beer (355 mL), one 5 oz glass of wine (148 mL), or one 1 oz glass of hard liquor (44 mL). Do not use any products that contain nicotine or tobacco. These products include cigarettes, chewing tobacco, and vaping devices, such as e-cigarettes. If you need help quitting, ask your health care provider. Activity  Follow a regular exercise program to stay fit. This will help you maintain your balance. Ask your health care provider what types of exercise are appropriate for you. If you need a cane or walker, use it as recommended by your health care provider. Wear supportive shoes that have nonskid soles. Safety  Remove any tripping hazards, such as rugs, cords, and clutter. Install safety equipment such as grab bars in bathrooms and safety rails on stairs. Keep rooms and walkways   well-lit. General instructions Talk with your health care provider about your risks for falling. Tell your health care provider if: You fall. Be sure to tell your health care provider about all falls, even ones that seem minor. You feel dizzy, tiredness (fatigue), or off-balance. Take over-the-counter and prescription medicines only as told by your health care provider. These include  supplements. Eat a healthy diet and maintain a healthy weight. A healthy diet includes low-fat dairy products, low-fat (lean) meats, and fiber from whole grains, beans, and lots of fruits and vegetables. Stay current with your vaccines. Schedule regular health, dental, and eye exams. Summary Having a healthy lifestyle and getting preventive care can help to protect your health and wellness after age 81. Screening and testing are the best way to find a health problem early and help you avoid having a fall. Early diagnosis and treatment give you the best chance for managing medical conditions that are more common for people who are older than age 81. Falls are a major cause of broken bones and head injuries in people who are older than age 81. Take precautions to prevent a fall at home. Work with your health care provider to learn what changes you can make to improve your health and wellness and to prevent falls. This information is not intended to replace advice given to you by your health care provider. Make sure you discuss any questions you have with your health care provider. Document Revised: 03/07/2021 Document Reviewed: 03/07/2021 [employer] Patient Education  2024 [employer]  

## 2023-10-16 NOTE — Assessment & Plan Note (Signed)
Clinically stable.  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

## 2023-10-16 NOTE — Assessment & Plan Note (Signed)
BP Readings from Last 3 Encounters:  10/16/23 132/78  09/11/23 (!) 178/85  08/20/23 132/74  Well-controlled hypertension off medications Was at some point advised to take clonidine 0.1 mg as needed

## 2023-10-16 NOTE — Progress Notes (Signed)
Elizabeth Olsen 82 y.o.   Chief Complaint  Patient presents with   Establish Care    Patient states she patient needs G7 dexcom she wont be able to endocrinologist till the new year and has been paying out of pocket. She wants new prescription for G7 dexcom.  Patient states she was seeing Dr. Azucena Kuba at [facility] before he retired. She just moved back to [place] from [facility] where she was seeing Dr Norvel Richards daughter in [facility] as a PCP. Wants referral for allergist      HISTORY OF PRESENT ILLNESS: This is a 82 y.o. female first visit to this office, here to establish care with me.  Accompanied by daughter [place] Multiple chronic medical conditions including the following: 1.  Coronary artery disease status post stent placement in the past 2.  On chronic long-term anticoagulation with Eliquis 3.  History of hypertension 4.  History of diabetes insulin-dependent 5.  End-stage renal disease on dialysis 6.  Hypothyroidism 7.  History of congestive heart failure 8.  Cognitive problems with memory issues.  Also has peripheral diabetic neuropathy.  Has been seen by neurologist.  HPI   Prior to Admission medications   Medication Sig Start Date End Date Taking? Authorizing Provider  apixaban (ELIQUIS) 2.5 MG TABS tablet Take 1 tablet (2.5 mg total) by mouth 2 (two) times daily. 07/05/23  Yes Georgeanna Lea, MD  cloNIDine (CATAPRES) 0.1 MG tablet Take 1 tablet (0.1 mg total) by mouth daily as needed (FOR SYSTOLIC BLOOD PRESSURE GREATER THAN 170 AND OR DIASTOLIC FOR BLOOD PRESSURE GREATER 100). 05/18/23  Yes Bender, Irving Burton, DO  Continuous Blood Gluc Sensor (FREESTYLE LIBRE 14 DAY SENSOR) MISC 1 each by Other route See admin instructions. 10/22/19  Yes [provider]  HUMALOG 100 UNIT/ML injection Inject 50-100 Units into the skin See admin instructions. Pump 02/12/20  Yes [provider]  Insulin Disposable Pump (OMNIPOD DASH 5 PACK PODS) MISC 1 each by Other route  daily. 05/11/20  Yes [provider]  insulin glargine (LANTUS SOLOSTAR) 100 UNIT/ML Solostar Pen Inject 1-100 Units into the skin in the morning. Unknown Sliding scale 01/26/20  Yes [provider]  Insulin Syringe-Needle U-100 (ULTICARE INSULIN SYRINGE) 30G X 1/2" 0.5 ML MISC Please use as previously directed Patient taking differently: 1 each by Other route See admin instructions. Please use as previously directed 05/18/23  Yes Bender, Irving Burton, DO  levothyroxine (SYNTHROID) 88 MCG tablet TAKE 1 TABLET(88 MCG) BY MOUTH DAILY AT 6 AM 08/21/23 08/20/24 Yes Morene Crocker, MD  metoprolol tartrate (LOPRESSOR) 25 MG tablet Take 12.5 mg by mouth daily as needed (IF HR more than 100 and syst BP more than 160 per daughter).   Yes [provider]  [facility] VERIO test strip 1 each by Other route as needed for other (Glucose). 08/24/17  Yes [provider]  senna (SENOKOT) 8.6 MG TABS tablet Take 2 tablets (17.2 mg total) by mouth daily as needed for mild constipation. 06/14/23  Yes Masters, Katie, DO  VELPHORO 500 MG chewable tablet Chew 500 mg by mouth 5 (five) times daily as needed. 09/11/23  Yes [provider]  docusate (COLACE) 50 MG/5ML liquid Take 5 mLs (50 mg total) by mouth 2 (two) times daily as needed for mild constipation. Patient not taking: Reported on 10/16/2023 06/14/23   Masters, Florentina Addison, DO  donepezil (ARICEPT) 5 MG tablet Take 1 tablet (5 mg total) by mouth daily. Patient not taking: Reported on 10/16/2023 06/19/23  Marcos Eke, PA-C  lidocaine-prilocaine (EMLA) cream Apply 1 Application topically once for 1 dose. 10/16/23 10/16/23  Georgina Quint, MD  nystatin (MYCOSTATIN/NYSTOP) powder Apply topically 2 (two) times daily. Patient not taking: Reported on 10/16/2023 05/18/23   Faith Rogue, DO  ondansetron (ZOFRAN) 4 MG tablet Take 1 tablet (4 mg total) by mouth every 8 (eight) hours as needed for nausea or vomiting. Patient not  taking: Reported on 10/16/2023 06/14/23   Masters, Florentina Addison, DO  oxyCODONE-acetaminophen (PERCOCET) 5-325 MG tablet Take 1 tablet by mouth every 6 (six) hours as needed for severe pain. Patient not taking: Reported on 10/16/2023 05/31/23 05/30/24  Graceann Congress, PA-C    Allergies  Allergen Reactions   Erythromycin Shortness Of Breath and Swelling    "throat swelling", sob, thrashing ,    Penicillins Rash   Tape Other (See Comments)    Patient states that she prefers paper tape because other tapes tear skin.   Doxylamine-Dm Rash   Nyquil [Pseudoeph-Doxylamine-Dm-Apap] Rash   Sulfa Antibiotics Rash    Patient Active Problem List   Diagnosis Date Noted   Moderate protein-calorie malnutrition (HCC) 06/28/2023   Constipation 06/12/2023   Acute exacerbation of CHF (congestive heart failure) (HCC) 06/11/2023   Type 2 diabetes mellitus with chronic kidney disease on chronic dialysis, without long-term current use of insulin (HCC) 06/11/2023   Pleural effusion 05/17/2023   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   Iron deficiency anemia, unspecified 05/08/2023   Atherosclerotic heart disease of native coronary artery without angina pectoris 05/08/2023   Allergy, unspecified, initial encounter 05/08/2023   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   Atrial fibrillation with RVR (HCC) 05/05/2023   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) 02/14/2021   ESRD (end stage renal disease) (HCC) 09/21/2020   Congestive heart failure (CHF) (HCC) 04/12/2020   Vitamin D deficiency 10/06/2019   Essential hypertension 09/11/2018   Vertigo 06/24/2018   Anemia of chronic disease 12/10/2017   Acquired hypothyroidism 10/13/2017   Type 1 diabetes (HCC) 07/23/2017   Hyperlipidemia 07/23/2017   Iron deficiency 07/23/2017   Cardiomyopathy (HCC) 07/23/2017    Past Medical History:  Diagnosis Date   A-fib (HCC)    Acquired hypothyroidism 10/13/2017   Acute diastolic CHF (congestive  heart failure) (HCC) 11/20/2019   Acute respiratory failure with hypoxia (HCC) 10/13/2017   Acute systolic heart failure (HCC) 10/13/2017   Anemia    Anemia associated with diabetes mellitus (HCC) 10/13/2017   Anemia of chronic disease 12/10/2017   Blood transfusion    Blood transfusion without reported diagnosis    with breast surgery   Breast cancer (HCC) 15 years ago   left    Cardiomyopathy (HCC) 07/23/2017   Ejection fraction 45-50% may; from September 2018   CHF (congestive heart failure) (HCC)    Chronic cholecystitis with calculus 01/22/2018   Colon polyp 03/2007   adenomatous   COVID-19 virus infection 11/20/2019   Diabetes mellitus    dx 1998.  was told prior to getting chemo that her bld sugar rose.  She thought it would go back   Dyspnea    d/t anemia   ESRD (end stage renal disease) on dialysis [facility])    HD MWF   Essential hypertension 09/11/2018   GERD (gastroesophageal reflux disease)    Heart murmur    as child   Hernia, incisional    Hyperlipidemia    Hypertension    Hypothyroidism    Iron deficiency 07/23/2017   Near syncope  04/01/2018   Neuropathy    Osteoarthritis, hip, bilateral    PONV (postoperative nausea and vomiting)    Refractory anemia, unspecified (HCC) 12/10/2017   Short-term memory loss    per pt's daughter   Type 1 diabetes (HCC) 07/23/2017   Vertigo 06/24/2018   Vitamin D deficiency     Past Surgical History:  Procedure Laterality Date   A/V FISTULAGRAM N/A 05/16/2023   Procedure: A/V Fistulagram;  Surgeon: Victorino Sparrow, MD;  Location: [facility] INVASIVE CV LAB;  Service: Cardiovascular;  Laterality: N/A;   ABDOMINAL HYSTERECTOMY     APPENDECTOMY     BREAST SURGERY     CAPD REMOVAL N/A 05/31/2023   Procedure: REMOVAL CONTINUOUS AMBULATORY PERITONEAL DIALYSIS  (CAPD) CATHETER;  Surgeon: [name], MD;  Location: [facility] OR;  Service: Vascular;  Laterality: N/A;   CHOLECYSTECTOMY  01/22/2018   LAPROSCOPIC    CHOLECYSTECTOMY N/A  01/22/2018   Procedure: LAPAROSCOPIC CHOLECYSTECTOMY WITH INTRAOPERATIVE CHOLANGIOGRAM;  Surgeon: Manus Rudd, MD;  Location: [facility] OR;  Service: General;  Laterality: N/A;   COLONOSCOPY  2013   due next 03-2017   EYE SURGERY     bil cataracts   IR THROMBECTOMY AV FISTULA W/THROMBOLYSIS/PTA INC/SHUNT/IMG RIGHT Right 09/11/2023   IR US GUIDE VASC ACCESS RIGHT  09/11/2023   IUD REMOVAL     with appendectomy   MASTECTOMY, MODIFIED RADICAL W/RECONSTRUCTION Left 1998   10 nodes out   PERIPHERAL VASCULAR INTERVENTION  05/16/2023   Procedure: PERIPHERAL VASCULAR INTERVENTION;  Surgeon: Victorino Sparrow, MD;  Location: [facility] INVASIVE CV LAB;  Service: Cardiovascular;;   THORACENTESIS Right 05/17/2023   Procedure: THORACENTESIS;  Surgeon: Lorin Glass, MD;  Location: [facility] ENDOSCOPY;  Service: Pulmonary;  Laterality: Right;   TUMOR EXCISION Left    x 2, neck, head    Social History   Socioeconomic History   Marital status: Widowed    Spouse name: Not on file   Number of children: 2   Years of education: Not on file   Highest education level: Not on file  Occupational History   Occupation: homemaker  Tobacco Use   Smoking status: Never   Smokeless tobacco: Never  Vaping Use   Vaping status: Never Used  Substance and Sexual Activity   Alcohol use: No   Drug use: No   Sexual activity: Not Currently  Other Topics Concern   Not on file  Social History Narrative   1 son is deceased. Husband is deceased   Social Drivers of Corporate investment banker Strain: Not on file  Food Insecurity: No Food Insecurity (06/12/2023)   Hunger Vital Sign    Worried About Running Out of Food in the Last Year: Never true    Ran Out of Food in the Last Year: Never true  Transportation Needs: No Transportation Needs (06/12/2023)   PRAPARE - Administrator, Civil Service (Medical): No    Lack of Transportation (Non-Medical): No  Physical Activity: Not on file  Stress: Not on file  Social  Connections: Not on file  Intimate Partner Violence: Not At Risk (06/12/2023)   Humiliation, Afraid, Rape, and Kick questionnaire    Fear of Current or Ex-Partner: No    Emotionally Abused: No    Physically Abused: No    Sexually Abused: No    Family History  Problem Relation Age of Onset   Diabetes Other        both sides of family   Hypertension Father  Congestive Heart Failure Father    Peripheral vascular disease Father    Hypertension Maternal Grandfather    Hypertension Maternal Grandmother    Stomach cancer Maternal Grandmother        GGM   Heart attack Brother    Colon cancer Neg Hx    Esophageal cancer Neg Hx    Pancreatic cancer Neg Hx    Rectal cancer Neg Hx      Review of Systems  Constitutional: Negative.  Negative for chills and fever.  HENT: Negative.  Negative for congestion and sore throat.   Respiratory: Negative.  Negative for cough and shortness of breath.   Cardiovascular: Negative.  Negative for chest pain and palpitations.  Gastrointestinal:  Negative for abdominal pain, diarrhea, nausea and vomiting.  Genitourinary: Negative.  Negative for hematuria.  Skin: Negative.  Negative for rash.  Neurological: Negative.  Negative for dizziness and headaches.  All other systems reviewed and are negative.   Vitals:   10/16/23 1019  BP: 132/78  Pulse: 71  Temp: 98.1 F (36.7 C)  SpO2: 98%    Physical Exam Constitutional:      Appearance: Normal appearance.  HENT:     Head: Normocephalic.     Mouth/Throat:     Mouth: Mucous membranes are moist.     Pharynx: Oropharynx is clear.  Eyes:     Extraocular Movements: Extraocular movements intact.     Pupils: Pupils are equal, round, and reactive to light.  Cardiovascular:     Rate and Rhythm: Normal rate and regular rhythm.     Pulses: Normal pulses.     Heart sounds: Normal heart sounds.  Pulmonary:     Effort: Pulmonary effort is normal.     Breath sounds: Normal breath sounds.   Musculoskeletal:     Cervical back: No tenderness.  Lymphadenopathy:     Cervical: No cervical adenopathy.  Skin:    General: Skin is warm and dry.     Capillary Refill: Capillary refill takes less than 2 seconds.  Neurological:     General: No focal deficit present.     Mental Status: She is alert and oriented to person, place, and time.  Psychiatric:        Mood and Affect: Mood normal.        Behavior: Behavior normal.      ASSESSMENT & PLAN: A total of 48 minutes was spent with the patient and counseling/coordination of care regarding preparing for this visit, review of available medical records, establishing care with me, review of multiple chronic medical problems and their management, review of all medications, review of health maintenance items, education and nutrition, prognosis, documentation, and need for follow-up.  Problem List Items Addressed This Visit       Cardiovascular and Mediastinum   Essential hypertension   BP Readings from Last 3 Encounters:  10/16/23 132/78  09/11/23 (!) 178/85  08/20/23 132/74  Well-controlled hypertension off medications Was at some point advised to take clonidine 0.1 mg as needed       Congestive heart failure (CHF) (HCC)   Clinically normovolemic Stable no concerns      Atrial fibrillation with RVR (HCC)   Isolated episode related to high dose of Synthroid No recent episodes      Atherosclerotic heart disease of native coronary artery without angina pectoris   With history of stents in the past Sees cardiologist on a regular basis. Continues Eliquis 2.5 mg twice a day and metoprolol tartrate 12.5 mg  daily as needed        Endocrine   Acquired hypothyroidism   Clinically euthyroid Continue Synthroid 88 mcg daily      Type 2 diabetes mellitus with chronic kidney disease on chronic dialysis, without long-term current use of insulin (HCC) - Primary   Presently on insulin pump Has endocrinology appointment to  establish care next March 2025 Clinically stable Dialysis 3 times a week Normovolemic at present time.  No complications.        Genitourinary   ESRD (end stage renal disease) (HCC)   On dialysis 3 times a week Clinically stable        Other   Anemia of chronic disease   Clinically stable.  Related to end-stage renal disease      Other Visit Diagnoses       Allergy, initial encounter       Relevant Orders   Ambulatory referral to Allergy     Encounter to establish care          Patient Instructions  Health Maintenance After Age 70 After age 33, you are at a higher risk for certain long-term diseases and infections as well as injuries from falls. Falls are a major cause of broken bones and head injuries in people who are older than age 36. Getting regular preventive care can help to keep you healthy and well. Preventive care includes getting regular testing and making lifestyle changes as recommended by your health care provider. Talk with your health care provider about: Which screenings and tests you should have. A screening is a test that checks for a disease when you have no symptoms. A diet and exercise plan that is right for you. What should I know about screenings and tests to prevent falls? Screening and testing are the best ways to find a health problem early. Early diagnosis and treatment give you the best chance of managing medical conditions that are common after age 36. Certain conditions and lifestyle choices may make you more likely to have a fall. Your health care provider may recommend: Regular vision checks. Poor vision and conditions such as cataracts can make you more likely to have a fall. If you wear glasses, make sure to get your prescription updated if your vision changes. Medicine review. Work with your health care provider to regularly review all of the medicines you are taking, including over-the-counter medicines. Ask your health care provider about any  side effects that may make you more likely to have a fall. Tell your health care provider if any medicines that you take make you feel dizzy or sleepy. Strength and balance checks. Your health care provider may recommend certain tests to check your strength and balance while standing, walking, or changing positions. Foot health exam. Foot pain and numbness, as well as not wearing proper footwear, can make you more likely to have a fall. Screenings, including: Osteoporosis screening. Osteoporosis is a condition that causes the bones to get weaker and break more easily. Blood pressure screening. Blood pressure changes and medicines to control blood pressure can make you feel dizzy. Depression screening. You may be more likely to have a fall if you have a fear of falling, feel depressed, or feel unable to do activities that you used to do. Alcohol use screening. Using too much alcohol can affect your balance and may make you more likely to have a fall. Follow these instructions at home: Lifestyle Do not drink alcohol if: Your health care provider tells you  not to drink. If you drink alcohol: Limit how much you have to: 0-1 drink a day for women. 0-2 drinks a day for men. Know how much alcohol is in your drink. In the [place] one drink equals one 12 oz bottle of beer (355 mL), one 5 oz glass of wine (148 mL), or one 1 oz glass of hard liquor (44 mL). Do not use any products that contain nicotine or tobacco. These products include cigarettes, chewing tobacco, and vaping devices, such as e-cigarettes. If you need help quitting, ask your health care provider. Activity  Follow a regular exercise program to stay fit. This will help you maintain your balance. Ask your health care provider what types of exercise are appropriate for you. If you need a cane or walker, use it as recommended by your health care provider. Wear supportive shoes that have nonskid soles. Safety  Remove any tripping hazards,  such as rugs, cords, and clutter. Install safety equipment such as grab bars in bathrooms and safety rails on stairs. Keep rooms and walkways well-lit. General instructions Talk with your health care provider about your risks for falling. Tell your health care provider if: You fall. Be sure to tell your health care provider about all falls, even ones that seem minor. You feel dizzy, tiredness (fatigue), or off-balance. Take over-the-counter and prescription medicines only as told by your health care provider. These include supplements. Eat a healthy diet and maintain a healthy weight. A healthy diet includes low-fat dairy products, low-fat (lean) meats, and fiber from whole grains, beans, and lots of fruits and vegetables. Stay current with your vaccines. Schedule regular health, dental, and eye exams. Summary Having a healthy lifestyle and getting preventive care can help to protect your health and wellness after age 19. Screening and testing are the best way to find a health problem early and help you avoid having a fall. Early diagnosis and treatment give you the best chance for managing medical conditions that are more common for people who are older than age 57. Falls are a major cause of broken bones and head injuries in people who are older than age 15. Take precautions to prevent a fall at home. Work with your health care provider to learn what changes you can make to improve your health and wellness and to prevent falls. This information is not intended to replace advice given to you by your health care provider. Make sure you discuss any questions you have with your health care provider. Document Revised: 03/07/2021 Document Reviewed: 03/07/2021 Elsevier Patient Education  2024 Elsevier Inc.       Edwina Barth, MD  Primary Care at [facility]

## 2023-10-16 NOTE — Assessment & Plan Note (Signed)
On dialysis 3 times a week Clinically stable

## 2023-10-16 NOTE — Assessment & Plan Note (Signed)
Clinically normovolemic Stable no concerns

## 2023-10-16 NOTE — Assessment & Plan Note (Addendum)
Presently on insulin pump Has endocrinology appointment to establish care next March 2025 Clinically stable Dialysis 3 times a week Normovolemic at present time.  No complications.

## 2023-10-19 ENCOUNTER — Encounter: Payer: Self-pay | Admitting: Hematology

## 2023-10-19 ENCOUNTER — Encounter (HOSPITAL_COMMUNITY): Payer: Self-pay | Admitting: Emergency Medicine

## 2023-10-19 ENCOUNTER — Ambulatory Visit (HOSPITAL_COMMUNITY)
Admission: EM | Admit: 2023-10-19 | Discharge: 2023-10-19 | Disposition: A | Discharge status: Home / Self Care | Payer: 59 | Attending: Emergency Medicine | Admitting: Emergency Medicine

## 2023-10-19 ENCOUNTER — Ambulatory Visit: Payer: Self-pay | Admitting: Emergency Medicine

## 2023-10-19 DIAGNOSIS — N3001 Acute cystitis with hematuria: Secondary | ICD-10-CM

## 2023-10-19 LAB — POCT URINALYSIS DIP (MANUAL ENTRY)
Glucose, UA: 100 mg/dL — AB
Nitrite, UA: NEGATIVE
Protein Ur, POC: >=300 mg/dL — AB
Spec Grav, UA: 1.02 (ref 1.010–1.025)
Urobilinogen, UA: 1 U/dL
pH, UA: 6 (ref 5.0–8.0)

## 2023-10-19 MED ORDER — CEPHALEXIN 500 MG PO CAPS
ORAL_CAPSULE | ORAL | 0 refills | Status: DC
Start: 2023-10-19 — End: 2023-10-19

## 2023-10-19 MED ORDER — CEPHALEXIN 500 MG PO CAPS: ORAL_CAPSULE | ORAL | 0 refills | Status: DC

## 2023-10-19 NOTE — Telephone Encounter (Signed)
  Chief Complaint: blood in urine and flank pain Symptoms: blood in urine and left side flank pain Frequency: patient anuric but had small amount of urine with blood in it after getting home from dialysis Pertinent Negatives: Patient denies fever Disposition: [] ED /[x] Urgent Care (no appt availability in office) / [] Appointment(In office/virtual)/ []  [place] [facility]/ [] Home Care/ [] Refused Recommended Disposition /[] [place] [facility]/ []  Follow-up with PCP Additional Notes: Patient's daugther called and stated patient was experiencing back pain last night. Went to dialysis today and complained of back pain, got home from dialysis and had small urination with blood in it. Daughter expressed that patient has frequent UTIs occurring every 2-3 months and these are the symptoms she experiences, and that at times it has progressed to a kidney infection. Advised patient's daugther that patient needs to be seen and evaluated asap and daugther stated she will take her to the urgent care right now. Daughter stated they have visited this urgent care multiple times and they can help.    Reason for Disposition  [1] Pain or burning with passing urine AND [2] side (flank) or back pain present  Answer Assessment - Initial Assessment Questions 1. SYMPTOM: "What's the main symptom you're concerned about?" (e.g., frequency, incontinence)     Patient's daughter states there is blood when she tries to urinate 2. ONSET: "When did the  3  start?"     Daughter noticed blood first time today, back pain started last night 3. PAIN: "Is there any pain?" If Yes, ask: "How bad is it?" (Scale: 1-10; mild, moderate, severe)     Pain in lower back on left side 4. CAUSE: "What do you think is causing the symptoms?"     UTI, patient's daughter states this is recurring every 2-3 months and it has always been a UTI 5. OTHER SYMPTOMS: "Do you have any other symptoms?" (e.g., blood in urine, fever, flank pain, pain with  urination)     Blood in urine, denies fever  Protocols used: Urinary Symptoms-A-AH, Urine - Blood In-A-AH

## 2023-10-19 NOTE — ED Triage Notes (Signed)
Pt is on dialysis MWF (due to holidays she went Sunday) Pt c/o lower back pain and blood in urine since yesterday.

## 2023-10-19 NOTE — ED Provider Notes (Signed)
[facility]    CSN: [id number] Arrival date & time: 10/19/23  1803      History   Chief Complaint Chief Complaint  Patient presents with   Dysuria    HPI Elizabeth Olsen is a 82 y.o. female.   Patient presents to clinic with daughter who provides most of history.  Patient has chronic kidney disease and is on dialysis every Monday, Wednesday and Friday.  She started to have lower back pain yesterday.  Today when she urinated a small amount after dialysis there was blood in it.  Daughter reports this is how her urinary tract infection start, and they will rapidly lead to altered mental status.  Patient has not had any fevers.  No nausea or vomiting.  The history is provided by the patient, a caregiver and medical records.  Dysuria   Past Medical History:  Diagnosis Date   A-fib (HCC)    Acquired hypothyroidism 10/13/2017   Acute diastolic CHF (congestive heart failure) (HCC) 11/20/2019   Acute respiratory failure with hypoxia (HCC) 10/13/2017   Acute systolic heart failure (HCC) 10/13/2017   Anemia    Anemia associated with diabetes mellitus (HCC) 10/13/2017   Anemia of chronic disease 12/10/2017   Blood transfusion    Blood transfusion without reported diagnosis    with breast surgery   Breast cancer (HCC) 15 years ago   left    Cardiomyopathy (HCC) 07/23/2017   Ejection fraction 45-50% may; from September 2018   CHF (congestive heart failure) (HCC)    Chronic cholecystitis with calculus 01/22/2018   Colon polyp 03/2007   adenomatous   COVID-19 virus infection 11/20/2019   Diabetes mellitus    dx 1998.  was told prior to getting chemo that her bld sugar rose.  She thought it would go back   Dyspnea    d/t anemia   ESRD (end stage renal disease) on dialysis (HCC)    HD MWF   Essential hypertension 09/11/2018   GERD (gastroesophageal reflux disease)    Heart murmur    as child   Hernia, incisional    Hyperlipidemia    Hypertension     Hypothyroidism    Iron deficiency 07/23/2017   Near syncope 04/01/2018   Neuropathy    Osteoarthritis, hip, bilateral    PONV (postoperative nausea and vomiting)    Refractory anemia, unspecified (HCC) 12/10/2017   Short-term memory loss    per pt's daughter   Type 1 diabetes (HCC) 07/23/2017   Vertigo 06/24/2018   Vitamin D deficiency     Patient Active Problem List   Diagnosis Date Noted   Moderate protein-calorie malnutrition (HCC) 06/28/2023   Constipation 06/12/2023   Type 2 diabetes mellitus with chronic kidney disease on chronic dialysis, without long-term current use of insulin (HCC) 06/11/2023   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   Iron deficiency anemia, unspecified 05/08/2023   Atherosclerotic heart disease of native coronary artery without angina pectoris 05/08/2023   Allergy, unspecified, initial encounter 05/08/2023   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   Atrial fibrillation with RVR (HCC) 05/05/2023   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) 02/14/2021   ESRD (end stage renal disease) (HCC) 09/21/2020   Congestive heart failure (CHF) (HCC) 04/12/2020   Vitamin D deficiency 10/06/2019   Essential hypertension 09/11/2018   Vertigo 06/24/2018   Anemia of chronic disease 12/10/2017   Acquired hypothyroidism 10/13/2017   Type 1 diabetes (HCC) 07/23/2017   Hyperlipidemia 07/23/2017   Iron deficiency 07/23/2017  Cardiomyopathy (HCC) 07/23/2017    Past Surgical History:  Procedure Laterality Date   A/V FISTULAGRAM N/A 05/16/2023   Procedure: A/V Fistulagram;  Surgeon: Victorino Sparrow, MD;  Location: [facility] INVASIVE CV LAB;  Service: Cardiovascular;  Laterality: N/A;   ABDOMINAL HYSTERECTOMY     APPENDECTOMY     BREAST SURGERY     CAPD REMOVAL N/A 05/31/2023   Procedure: REMOVAL CONTINUOUS AMBULATORY PERITONEAL DIALYSIS  (CAPD) CATHETER;  Surgeon: [name], MD;  Location: [facility] OR;  Service: Vascular;  Laterality: N/A;    CHOLECYSTECTOMY  01/22/2018   LAPROSCOPIC    CHOLECYSTECTOMY N/A 01/22/2018   Procedure: LAPAROSCOPIC CHOLECYSTECTOMY WITH INTRAOPERATIVE CHOLANGIOGRAM;  Surgeon: Manus Rudd, MD;  Location: [facility] OR;  Service: General;  Laterality: N/A;   COLONOSCOPY  2013   due next 03-2017   EYE SURGERY     bil cataracts   IR THROMBECTOMY AV FISTULA W/THROMBOLYSIS/PTA INC/SHUNT/IMG RIGHT Right 09/11/2023   IR US GUIDE VASC ACCESS RIGHT  09/11/2023   IUD REMOVAL     with appendectomy   MASTECTOMY, MODIFIED RADICAL W/RECONSTRUCTION Left 1998   10 nodes out   PERIPHERAL VASCULAR INTERVENTION  05/16/2023   Procedure: PERIPHERAL VASCULAR INTERVENTION;  Surgeon: Victorino Sparrow, MD;  Location: [facility] INVASIVE CV LAB;  Service: Cardiovascular;;   THORACENTESIS Right 05/17/2023   Procedure: THORACENTESIS;  Surgeon: Lorin Glass, MD;  Location: [facility] ENDOSCOPY;  Service: Pulmonary;  Laterality: Right;   TUMOR EXCISION Left    x 2, neck, head    OB History   No obstetric history on file.      Home Medications    Prior to Admission medications   Medication Sig Start Date End Date Taking? Authorizing Provider  apixaban (ELIQUIS) 2.5 MG TABS tablet Take 1 tablet (2.5 mg total) by mouth 2 (two) times daily. 07/05/23   Georgeanna Lea, MD  cephALEXin (KEFLEX) 500 MG capsule Take a capsule daily for 7 days and add another capsule at the end of each dialysis session. 10/19/23   Elisandro Jarrett, [place] N, FNP  cloNIDine (CATAPRES) 0.1 MG tablet Take 1 tablet (0.1 mg total) by mouth daily as needed (FOR SYSTOLIC BLOOD PRESSURE GREATER THAN 170 AND OR DIASTOLIC FOR BLOOD PRESSURE GREATER 100). 05/18/23   Faith Rogue, DO  Continuous Blood Gluc Sensor (FREESTYLE LIBRE 14 DAY SENSOR) MISC 1 each by Other route See admin instructions. 10/22/19   [provider]  Continuous Glucose Sensor (DEXCOM G6 SENSOR) MISC Apply new sensor every 10 days on abdomen, upper arm, or upper buttocks. Rotate placement To monitor blood  glucose levels 10/16/23   Sagardia, [name], MD  docusate (COLACE) 50 MG/5ML liquid Take 5 mLs (50 mg total) by mouth 2 (two) times daily as needed for mild constipation. Patient not taking: Reported on 10/16/2023 06/14/23   Masters, Florentina Addison, DO  donepezil (ARICEPT) 5 MG tablet Take 1 tablet (5 mg total) by mouth daily. Patient not taking: Reported on 10/16/2023 06/19/23   Marcos Eke, PA-C  HUMALOG 100 UNIT/ML injection Inject 50-100 Units into the skin See admin instructions. Pump 02/12/20   [provider]  Insulin Disposable Pump (OMNIPOD DASH 5 PACK PODS) MISC 1 each by Other route daily. 05/11/20   [provider]  insulin glargine (LANTUS SOLOSTAR) 100 UNIT/ML Solostar Pen Inject 1-100 Units into the skin in the morning. Unknown Sliding scale 01/26/20   [provider]  Insulin Syringe-Needle U-100 (ULTICARE INSULIN SYRINGE) 30G X 1/2" 0.5 ML MISC Please use  as previously directed Patient taking differently: 1 each by Other route See admin instructions. Please use as previously directed 05/18/23   Faith Rogue, DO  levothyroxine (SYNTHROID) 88 MCG tablet TAKE 1 TABLET(88 MCG) BY MOUTH DAILY AT 6 AM 08/21/23 08/20/24  Morene Crocker, MD  metoprolol tartrate (LOPRESSOR) 25 MG tablet Take 12.5 mg by mouth daily as needed (IF HR more than 100 and syst BP more than 160 per daughter).    [provider]  nystatin (MYCOSTATIN/NYSTOP) powder Apply topically 2 (two) times daily. Patient not taking: Reported on 10/16/2023 05/18/23   Faith Rogue, DO  ondansetron (ZOFRAN) 4 MG tablet Take 1 tablet (4 mg total) by mouth every 8 (eight) hours as needed for nausea or vomiting. Patient not taking: Reported on 10/16/2023 06/14/23   Masters, Florentina Addison, DO  [facility] VERIO test strip 1 each by Other route as needed for other (Glucose). 08/24/17   [provider]  senna (SENOKOT) 8.6 MG TABS tablet Take 2 tablets (17.2 mg total) by mouth daily as needed for mild  constipation. 06/14/23   Masters, Katie, DO  VELPHORO 500 MG chewable tablet Chew 500 mg by mouth 5 (five) times daily as needed. 09/11/23   [provider]    Family History Family History  Problem Relation Age of Onset   Diabetes Other        both sides of family   Hypertension Father    Congestive Heart Failure Father    Peripheral vascular disease Father    Hypertension Maternal Grandfather    Hypertension Maternal Grandmother    Stomach cancer Maternal Grandmother        GGM   Heart attack Brother    Colon cancer Neg Hx    Esophageal cancer Neg Hx    Pancreatic cancer Neg Hx    Rectal cancer Neg Hx     Social History Social History   Tobacco Use   Smoking status: Never   Smokeless tobacco: Never  Vaping Use   Vaping status: Never Used  Substance Use Topics   Alcohol use: No   Drug use: No     Allergies   Erythromycin, Penicillins, Tape, Doxylamine-dm, Nyquil [pseudoeph-doxylamine-dm-apap], and Sulfa antibiotics   Review of Systems Review of Systems  Per HPI   Physical Exam Triage Vital Signs ED Triage Vitals  Encounter Vitals Group     BP 10/19/23 1826 (!) 149/101     Systolic BP Percentile --      Diastolic BP Percentile --      Pulse Rate 10/19/23 1826 82     Resp 10/19/23 1826 17     Temp 10/19/23 1826 97.6 F (36.4 C)     Temp Source 10/19/23 1826 Oral     SpO2 10/19/23 1826 95 %     Weight --      Height --      Head Circumference --      Peak Flow --      Pain Score 10/19/23 1824 5     Pain Loc --      Pain Education --      Exclude from Growth Chart --    No data found.  Updated Vital Signs BP (!) 149/101 (BP Location: Right Arm)   Pulse 82   Temp 97.6 F (36.4 C) (Oral)   Resp 17   SpO2 95%   Visual Acuity Right Eye Distance:   Left Eye Distance:   Bilateral Distance:    Right Eye Near:  Left Eye Near:    Bilateral Near:     Physical Exam Vitals and nursing note reviewed.  Constitutional:      Appearance:  Normal appearance.  HENT:     Head: Normocephalic and atraumatic.     Right Ear: External ear normal.     Left Ear: External ear normal.     Nose: Nose normal.     Mouth/Throat:     Mouth: Mucous membranes are moist.  Eyes:     Conjunctiva/sclera: Conjunctivae normal.  Cardiovascular:     Rate and Rhythm: Normal rate.  Pulmonary:     Effort: Pulmonary effort is normal. No respiratory distress.  Abdominal:     Tenderness: There is no right CVA tenderness or left CVA tenderness.  Skin:    General: Skin is warm and dry.  Neurological:     General: No focal deficit present.     Mental Status: She is alert and oriented to person, place, and time.  Psychiatric:        Mood and Affect: Mood normal.        Behavior: Behavior normal.      UC Treatments / Results  Labs (all labs ordered are listed, but only abnormal results are displayed) Labs Reviewed  POCT URINALYSIS DIP (MANUAL ENTRY) - Abnormal; Notable for the following components:      Result Value   Color, UA brown (*)    Clarity, UA cloudy (*)    Glucose, UA =100 (*)    Bilirubin, UA moderate (*)    Ketones, POC UA small (15) (*)    Blood, UA large (*)    Protein Ur, POC >=300 (*)    Leukocytes, UA Large (3+) (*)    All other components within normal limits  URINE CULTURE    EKG   Radiology No results found.  Procedures Procedures (including critical care time)  Medications Ordered in UC Medications - No data to display  Initial Impression / Assessment and Plan / UC Course  I have reviewed the triage vital signs and the nursing notes.  Pertinent labs & imaging results that were available during my care of the patient were reviewed by me and considered in my medical decision making (see chart for details).  Vitals and triage reviewed, patient is hemodynamically stable.  Negative for CVA tenderness.  Febrile without tachycardia.  Urine shows large red blood cells and large leukocytes, will send for culture.   Tolerated fosfomycin well in the past, but most pharmacies do not have this in stock and daughter is worried that her symptoms may progress over the next few days.  Will send in Keflex once daily, adding an additional dose on dialysis days.  Follow-up and return precautions given, no questions at this time.  Daughter reports patient has had Keflex in the past and has done fine with it, no reaction.    Final Clinical Impressions(s) / UC Diagnoses   Final diagnoses:  Acute cystitis with hematuria     Discharge Instructions      Take the Keflex once daily and then add an extra dose after you finish dialysis. Follow-up with your primary care provider next week if no improvement.   Seek immediate care at the nearest Emergency Department if you develop any new or concerning symptoms.    ED Prescriptions     Medication Sig Dispense Auth. Provider   cephALEXin (KEFLEX) 500 MG capsule  (Status: Discontinued) Take a capsule daily for 7 days and add another  capsule at the end of each dialysis session. 20 capsule Rinaldo Ratel, [place] N, [place]   cephALEXin (KEFLEX) 500 MG capsule Take a capsule daily for 7 days and add another capsule at the end of each dialysis session. 10 capsule Kaylana Fenstermacher, [place] N, FNP      PDMP not reviewed this encounter.   Jacinta Penalver, [place] N, [place] 10/19/23 [phone]

## 2023-10-19 NOTE — Discharge Instructions (Addendum)
Take the Keflex once daily and then add an extra dose after you finish dialysis. Follow-up with your primary care provider next week if no improvement.   Seek immediate care at the nearest Emergency Department if you develop any new or concerning symptoms.

## 2023-10-25 ENCOUNTER — Other Ambulatory Visit (HOSPITAL_COMMUNITY): Payer: Self-pay

## 2023-10-31 ENCOUNTER — Encounter: Payer: Self-pay | Admitting: Hematology

## 2023-11-02 ENCOUNTER — Encounter: Payer: Self-pay | Admitting: Hematology

## 2023-11-08 ENCOUNTER — Encounter: Payer: Self-pay | Admitting: Hematology

## 2023-11-08 HISTORY — DX: Other disorders of phosphorus metabolism: E83.39

## 2023-11-18 ENCOUNTER — Encounter: Payer: Self-pay | Admitting: Hematology

## 2023-11-22 ENCOUNTER — Ambulatory Visit: Payer: 59 | Admitting: Allergy & Immunology

## 2023-11-27 ENCOUNTER — Other Ambulatory Visit: Payer: Self-pay | Admitting: Radiology

## 2023-11-27 ENCOUNTER — Telehealth: Payer: Self-pay

## 2023-11-27 DIAGNOSIS — E1122 Type 2 diabetes mellitus with diabetic chronic kidney disease: Secondary | ICD-10-CM

## 2023-11-27 MED ORDER — DEXCOM G6 TRANSMITTER MISC: 6 refills | Status: DC

## 2023-11-27 NOTE — Telephone Encounter (Signed)
G6 transmitter rx sent in

## 2023-11-27 NOTE — Telephone Encounter (Signed)
Copied from CRM [id number]. Topic: Clinical - Prescription Issue >> Nov 27, 2023  7:56 AM Dimitri Ped wrote: Reason for CRM: patient  daughter Ms. Byrd Hesselbach is calling cause patient needs a G6 dexcom transmitter called in to [id number] - [place], [place] - [address] AT [facility] OF [address]. We got the prescription for the g6 dexcom sensor just need the transmitter  [phone] Ms. Byrd Hesselbach

## 2023-11-29 ENCOUNTER — Ambulatory Visit (HOSPITAL_COMMUNITY)
Admission: EM | Admit: 2023-11-29 | Discharge: 2023-11-29 | Disposition: A | Discharge status: Home / Self Care | Payer: Medicaid Other | Attending: Family Medicine | Admitting: Family Medicine

## 2023-11-29 ENCOUNTER — Emergency Department (HOSPITAL_COMMUNITY)
Admission: EM | Admit: 2023-11-29 | Discharge: 2023-11-29 | Discharge status: Against Medical Advice | Payer: 59 | Source: Home / Self Care

## 2023-11-29 ENCOUNTER — Encounter (HOSPITAL_COMMUNITY): Payer: Self-pay

## 2023-11-29 ENCOUNTER — Encounter: Payer: Self-pay | Admitting: Hematology

## 2023-11-29 DIAGNOSIS — N3090 Cystitis, unspecified without hematuria: Secondary | ICD-10-CM | POA: Diagnosis not present

## 2023-11-29 DIAGNOSIS — W19XXXA Unspecified fall, initial encounter: Secondary | ICD-10-CM

## 2023-11-29 LAB — POCT URINALYSIS DIP (MANUAL ENTRY)
Glucose, UA: NEGATIVE mg/dL
Nitrite, UA: NEGATIVE
Protein Ur, POC: >=300 mg/dL — AB
Spec Grav, UA: >=1.03 — AB (ref 1.010–1.025)
Urobilinogen, UA: 1 U/dL
pH, UA: 5.5 (ref 5.0–8.0)

## 2023-11-29 MED ORDER — FOSFOMYCIN TROMETHAMINE 3 G PO PACK: 3.0000 g | PACK | Freq: Once | ORAL | 0 refills | Status: AC

## 2023-11-29 NOTE — Discharge Instructions (Signed)
Lo atendieron hoy por una infeccin del tracto urinario.  He enviado el antibitico fosfomicina a la farmacia como dosis nica para sus sntomas.  Contine aumentando los lquidos para eliminar esto de su sistema.  Si los sntomas continan o presenta fiebre, escalofros, nuseas o vmitos, vaya a la sala de emergencias para una evaluacin adicional.  You were seen today for urinary tract infection.  I have sent out the antibiotic fosfomycin to the pharmacy as a one time dose for your symptoms.  Please continue to increase fluids to flush this out of your system.  If you have continued symptoms, or develop fever, chills, nausea or vomiting then please go to the ER for further evaluation.

## 2023-11-29 NOTE — ED Provider Notes (Signed)
[facility]    CSN: [id number] Arrival date & time: 11/29/23  1146      History   Chief Complaint Chief Complaint  Patient presents with   Recurrent UTI    HPI Elizabeth Olsen is a 83 y.o. female.   Patient is here today for possible UTI.  She is a dialysis patient, three times/week.  History of UTI's.  Last seen here in 09/2023 for UTI.  Given keflex.  She has a full sensation in her bladder, which is usually what happens when she has a UTI.  Some urgency.  She states the best treatment is usually a one time dose of fosfomycin, but it can take a day to get the pharmacy at times.   The other day she tripped and fell backward on the street.  She hit her head, neck and shoulders.  No LOC.  No pain, but slight discomfort.  No fevers/chills.    Past Medical History:  Diagnosis Date   A-fib (HCC)    Acquired hypothyroidism 10/13/2017   Acute diastolic CHF (congestive heart failure) (HCC) 11/20/2019   Acute respiratory failure with hypoxia (HCC) 10/13/2017   Acute systolic heart failure (HCC) 10/13/2017   Anemia    Anemia associated with diabetes mellitus (HCC) 10/13/2017   Anemia of chronic disease 12/10/2017   Blood transfusion    Blood transfusion without reported diagnosis    with breast surgery   Breast cancer (HCC) 15 years ago   left    Cardiomyopathy (HCC) 07/23/2017   Ejection fraction 45-50% may; from September 2018   CHF (congestive heart failure) (HCC)    Chronic cholecystitis with calculus 01/22/2018   Colon polyp 03/2007   adenomatous   COVID-19 virus infection 11/20/2019   Diabetes mellitus    dx 1998.  was told prior to getting chemo that her bld sugar rose.  She thought it would go back   Dyspnea    d/t anemia   ESRD (end stage renal disease) on dialysis (HCC)    HD MWF   Essential hypertension 09/11/2018   GERD (gastroesophageal reflux disease)    Heart murmur    as child   Hernia, incisional    Hyperlipidemia    Hypertension     Hypothyroidism    Iron deficiency 07/23/2017   Near syncope 04/01/2018   Neuropathy    Osteoarthritis, hip, bilateral    PONV (postoperative nausea and vomiting)    Refractory anemia, unspecified (HCC) 12/10/2017   Short-term memory loss    per pt's daughter   Type 1 diabetes (HCC) 07/23/2017   Vertigo 06/24/2018   Vitamin D deficiency     Patient Active Problem List   Diagnosis Date Noted   Moderate protein-calorie malnutrition (HCC) 06/28/2023   Constipation 06/12/2023   Type 2 diabetes mellitus with chronic kidney disease on chronic dialysis, without long-term current use of insulin (HCC) 06/11/2023   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   Iron deficiency anemia, unspecified 05/08/2023   Atherosclerotic heart disease of native coronary artery without angina pectoris 05/08/2023   Allergy, unspecified, initial encounter 05/08/2023   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   Atrial fibrillation with RVR (HCC) 05/05/2023   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) 02/14/2021   ESRD (end stage renal disease) (HCC) 09/21/2020   Congestive heart failure (CHF) (HCC) 04/12/2020   Vitamin D deficiency 10/06/2019   Essential hypertension 09/11/2018   Vertigo 06/24/2018   Anemia of chronic disease 12/10/2017   Acquired hypothyroidism 10/13/2017  Type 1 diabetes (HCC) 07/23/2017   Hyperlipidemia 07/23/2017   Iron deficiency 07/23/2017   Cardiomyopathy (HCC) 07/23/2017    Past Surgical History:  Procedure Laterality Date   A/V FISTULAGRAM N/A 05/16/2023   Procedure: A/V Fistulagram;  Surgeon: Victorino Sparrow, MD;  Location: [facility] INVASIVE CV LAB;  Service: Cardiovascular;  Laterality: N/A;   ABDOMINAL HYSTERECTOMY     APPENDECTOMY     BREAST SURGERY     CAPD REMOVAL N/A 05/31/2023   Procedure: REMOVAL CONTINUOUS AMBULATORY PERITONEAL DIALYSIS  (CAPD) CATHETER;  Surgeon: [name], MD;  Location: [facility];  Service: Vascular;  Laterality: N/A;    CHOLECYSTECTOMY  01/22/2018   LAPROSCOPIC    CHOLECYSTECTOMY N/A 01/22/2018   Procedure: LAPAROSCOPIC CHOLECYSTECTOMY WITH INTRAOPERATIVE CHOLANGIOGRAM;  Surgeon: Manus Rudd, MD;  Location: [facility] OR;  Service: General;  Laterality: N/A;   COLONOSCOPY  2013   due next 03-2017   EYE SURGERY     bil cataracts   IR THROMBECTOMY AV FISTULA W/THROMBOLYSIS/PTA INC/SHUNT/IMG RIGHT Right 09/11/2023   IR US GUIDE VASC ACCESS RIGHT  09/11/2023   IUD REMOVAL     with appendectomy   MASTECTOMY, MODIFIED RADICAL W/RECONSTRUCTION Left 1998   10 nodes out   PERIPHERAL VASCULAR INTERVENTION  05/16/2023   Procedure: PERIPHERAL VASCULAR INTERVENTION;  Surgeon: Victorino Sparrow, MD;  Location: [facility] INVASIVE CV LAB;  Service: Cardiovascular;;   THORACENTESIS Right 05/17/2023   Procedure: THORACENTESIS;  Surgeon: Lorin Glass, MD;  Location: [facility] ENDOSCOPY;  Service: Pulmonary;  Laterality: Right;   TUMOR EXCISION Left    x 2, neck, head    OB History   No obstetric history on file.      Home Medications    Prior to Admission medications   Medication Sig Start Date End Date Taking? Authorizing Provider  apixaban (ELIQUIS) 2.5 MG TABS tablet Take 1 tablet (2.5 mg total) by mouth 2 (two) times daily. 07/05/23   Georgeanna Lea, MD  cloNIDine (CATAPRES) 0.1 MG tablet Take 1 tablet (0.1 mg total) by mouth daily as needed (FOR SYSTOLIC BLOOD PRESSURE GREATER THAN 170 AND OR DIASTOLIC FOR BLOOD PRESSURE GREATER 100). 05/18/23   Faith Rogue, DO  Continuous Blood Gluc Sensor (FREESTYLE LIBRE 14 DAY SENSOR) MISC 1 each by Other route See admin instructions. 10/22/19   [provider]  Continuous Glucose Sensor (DEXCOM G6 SENSOR) MISC Apply new sensor every 10 days on abdomen, upper arm, or upper buttocks. Rotate placement To monitor blood glucose levels 10/16/23   Sagardia, [name], MD  Continuous Glucose Transmitter (DEXCOM G6 TRANSMITTER) MISC Use device to help monitor Glucose. Replace with  new transmitter every 3 months 11/27/23   Georgina Quint, MD  docusate (COLACE) 50 MG/5ML liquid Take 5 mLs (50 mg total) by mouth 2 (two) times daily as needed for mild constipation. Patient not taking: Reported on 10/16/2023 06/14/23   Masters, Florentina Addison, DO  donepezil (ARICEPT) 5 MG tablet Take 1 tablet (5 mg total) by mouth daily. Patient not taking: Reported on 10/16/2023 06/19/23   Marcos Eke, PA-C  HUMALOG 100 UNIT/ML injection Inject 50-100 Units into the skin See admin instructions. Pump 02/12/20   [provider]  Insulin Disposable Pump (OMNIPOD DASH 5 PACK PODS) MISC 1 each by Other route daily. 05/11/20   [provider]  insulin glargine (LANTUS SOLOSTAR) 100 UNIT/ML Solostar Pen Inject 1-100 Units into the skin in the morning. Unknown Sliding scale 01/26/20   [provider]  Insulin  Syringe-Needle U-100 (ULTICARE INSULIN SYRINGE) 30G X 1/2" 0.5 ML MISC Please use as previously directed Patient taking differently: 1 each by Other route See admin instructions. Please use as previously directed 05/18/23   Faith Rogue, DO  levothyroxine (SYNTHROID) 88 MCG tablet TAKE 1 TABLET(88 MCG) BY MOUTH DAILY AT 6 AM 08/21/23 08/20/24  Morene Crocker, MD  metoprolol tartrate (LOPRESSOR) 25 MG tablet Take 12.5 mg by mouth daily as needed (IF HR more than 100 and syst BP more than 160 per daughter).    [provider]  nystatin (MYCOSTATIN/NYSTOP) powder Apply topically 2 (two) times daily. Patient not taking: Reported on 10/16/2023 05/18/23   Faith Rogue, DO  ondansetron (ZOFRAN) 4 MG tablet Take 1 tablet (4 mg total) by mouth every 8 (eight) hours as needed for nausea or vomiting. Patient not taking: Reported on 10/16/2023 06/14/23   Masters, Florentina Addison, DO  [facility] VERIO test strip 1 each by Other route as needed for other (Glucose). 08/24/17   [provider]  senna (SENOKOT) 8.6 MG TABS tablet Take 2 tablets (17.2 mg total) by mouth daily as  needed for mild constipation. 06/14/23   Masters, Katie, DO  VELPHORO 500 MG chewable tablet Chew 500 mg by mouth 5 (five) times daily as needed. 09/11/23   [provider]    Family History Family History  Problem Relation Age of Onset   Diabetes Other        both sides of family   Hypertension Father    Congestive Heart Failure Father    Peripheral vascular disease Father    Hypertension Maternal Grandfather    Hypertension Maternal Grandmother    Stomach cancer Maternal Grandmother        GGM   Heart attack Brother    Colon cancer Neg Hx    Esophageal cancer Neg Hx    Pancreatic cancer Neg Hx    Rectal cancer Neg Hx     Social History Social History   Tobacco Use   Smoking status: Never   Smokeless tobacco: Never  Vaping Use   Vaping status: Never Used  Substance Use Topics   Alcohol use: No   Drug use: No     Allergies   Erythromycin, Penicillins, Tape, Doxylamine-dm, Nyquil [pseudoeph-doxylamine-dm-apap], and Sulfa antibiotics   Review of Systems Review of Systems  Constitutional: Negative.   HENT: Negative.    Respiratory: Negative.    Cardiovascular: Negative.   Gastrointestinal:  Positive for abdominal pain.  Genitourinary:  Positive for urgency.     Physical Exam Triage Vital Signs ED Triage Vitals  Encounter Vitals Group     BP 11/29/23 1349 (!) 180/84     Systolic BP Percentile --      Diastolic BP Percentile --      Pulse Rate 11/29/23 1349 87     Resp 11/29/23 1349 20     Temp 11/29/23 1349 98.1 F (36.7 C)     Temp Source 11/29/23 1349 Oral     SpO2 11/29/23 1349 96 %     Weight --      Height --      Head Circumference --      Peak Flow --      Pain Score 11/29/23 1351 5     Pain Loc --      Pain Education --      Exclude from Growth Chart --    No data found.  Updated Vital Signs BP (!) 180/84 (BP Location: Left Arm)  Pulse 87   Temp 98.1 F (36.7 C) (Oral)   Resp 20   SpO2 96%   Visual Acuity Right Eye  Distance:   Left Eye Distance:   Bilateral Distance:    Right Eye Near:   Left Eye Near:    Bilateral Near:     Physical Exam Constitutional:      General: She is not in acute distress.    Appearance: Normal appearance. She is normal weight. She is not ill-appearing or toxic-appearing.  Cardiovascular:     Rate and Rhythm: Normal rate and regular rhythm.  Pulmonary:     Effort: Pulmonary effort is normal.     Breath sounds: Normal breath sounds.  Abdominal:     Palpations: Abdomen is soft.     Tenderness: There is no abdominal tenderness. There is no right CVA tenderness, left CVA tenderness or rebound.  Musculoskeletal:     Comments: No TTP to the back of the scalp or spine;  no TTP paraspinally;  full rom without pain or limitation;   Neurological:     Mental Status: She is alert.      UC Treatments / Results  Labs (all labs ordered are listed, but only abnormal results are displayed) Labs Reviewed  POCT URINALYSIS DIP (MANUAL ENTRY) - Abnormal; Notable for the following components:      Result Value   Color, UA straw (*)    Clarity, UA cloudy (*)    Bilirubin, UA moderate (*)    Ketones, POC UA small (15) (*)    Spec Grav, UA >=1.030 (*)    Blood, UA large (*)    Protein Ur, POC >=300 (*)    Leukocytes, UA Large (3+) (*)    All other components within normal limits  URINE CULTURE    EKG   Radiology No results found.  Procedures Procedures (including critical care time)  Medications Ordered in UC Medications - No data to display  Initial Impression / Assessment and Plan / UC Course  I have reviewed the triage vital signs and the nursing notes.  Pertinent labs & imaging results that were available during my care of the patient were reviewed by me and considered in my medical decision making (see chart for details).   Final Clinical Impressions(s) / UC Diagnoses   Final diagnoses:  Cystitis  Fall, initial encounter     Discharge Instructions       Lo atendieron hoy por una infeccin del tracto urinario.  He enviado el antibitico fosfomicina a la farmacia como dosis nica para sus sntomas.  Contine aumentando los lquidos para eliminar esto de su sistema.  Si los sntomas continan o presenta fiebre, escalofros, nuseas o vmitos, vaya a la sala de emergencias para una evaluacin adicional.  You were seen today for urinary tract infection.  I have sent out the antibiotic fosfomycin to the pharmacy as a one time dose for your symptoms.  Please continue to increase fluids to flush this out of your system.  If you have continued symptoms, or develop fever, chills, nausea or vomiting then please go to the ER for further evaluation.     ED Prescriptions     Medication Sig Dispense Auth. Provider   fosfomycin (MONUROL) 3 g PACK Take 3 g by mouth once for 1 dose. 3 g Jannifer Franklin, MD      PDMP not reviewed this encounter.   Jannifer Franklin, MD 11/29/23 [phone]

## 2023-11-29 NOTE — ED Triage Notes (Signed)
Pt is a dialysis pt and frequently gets UTI. C/o frequency and discomfort. Daughter states best results is one time dose of fosfomycin (powder mix in water) after dialysis.

## 2023-12-03 DIAGNOSIS — Z992 Dependence on renal dialysis: Secondary | ICD-10-CM | POA: Diagnosis not present

## 2023-12-03 DIAGNOSIS — N186 End stage renal disease: Secondary | ICD-10-CM | POA: Diagnosis not present

## 2023-12-03 DIAGNOSIS — N2581 Secondary hyperparathyroidism of renal origin: Secondary | ICD-10-CM | POA: Diagnosis not present

## 2023-12-03 DIAGNOSIS — R519 Headache, unspecified: Secondary | ICD-10-CM | POA: Diagnosis not present

## 2023-12-10 DIAGNOSIS — N186 End stage renal disease: Secondary | ICD-10-CM | POA: Diagnosis not present

## 2023-12-10 DIAGNOSIS — Z992 Dependence on renal dialysis: Secondary | ICD-10-CM | POA: Diagnosis not present

## 2023-12-10 DIAGNOSIS — N2581 Secondary hyperparathyroidism of renal origin: Secondary | ICD-10-CM | POA: Diagnosis not present

## 2023-12-17 DIAGNOSIS — N2581 Secondary hyperparathyroidism of renal origin: Secondary | ICD-10-CM | POA: Diagnosis not present

## 2023-12-17 DIAGNOSIS — N186 End stage renal disease: Secondary | ICD-10-CM | POA: Diagnosis not present

## 2023-12-17 DIAGNOSIS — Z992 Dependence on renal dialysis: Secondary | ICD-10-CM | POA: Diagnosis not present

## 2023-12-19 ENCOUNTER — Other Ambulatory Visit: Payer: Self-pay | Admitting: Emergency Medicine

## 2023-12-19 DIAGNOSIS — E039 Hypothyroidism, unspecified: Secondary | ICD-10-CM

## 2023-12-19 MED ORDER — LEVOTHYROXINE SODIUM 88 MCG PO TABS: 88.0000 ug | ORAL_TABLET | Freq: Every day | ORAL | 3 refills | Status: DC

## 2023-12-19 NOTE — Telephone Encounter (Signed)
Copied from CRM [id number]. Topic: Clinical - Medication Refill >> Dec 19, 2023  9:44 AM Ernst Spell wrote: Most Recent Primary Care Visit:  Provider: Georgina Quint  Department: [facility]  Visit Type: NEW PATIENT  Date: 10/16/2023  Medication: levothyroxine   Has the patient contacted their pharmacy? Yes No more refills  Is this the correct pharmacy for this prescription? Yes  This is the patient's preferred pharmacy:  [id number] - [place], [place] - [address] AT [facility] OF [address] [place] [address] Phone: [phone] Fax: [phone]   Has the prescription been filled recently? Yes  Is the patient out of the medication? Yes  Has the patient been seen for an appointment in the last year OR does the patient have an upcoming appointment? No  Can we respond through MyChart? Yes  Agent: Please be advised that Rx refills may take up to 3 business days. We ask that you follow-up with your pharmacy.

## 2023-12-21 ENCOUNTER — Encounter: Payer: Self-pay | Admitting: Hematology

## 2023-12-24 DIAGNOSIS — N2581 Secondary hyperparathyroidism of renal origin: Secondary | ICD-10-CM | POA: Diagnosis not present

## 2023-12-24 DIAGNOSIS — D631 Anemia in chronic kidney disease: Secondary | ICD-10-CM | POA: Diagnosis not present

## 2023-12-24 DIAGNOSIS — N186 End stage renal disease: Secondary | ICD-10-CM | POA: Diagnosis not present

## 2023-12-24 DIAGNOSIS — Z992 Dependence on renal dialysis: Secondary | ICD-10-CM | POA: Diagnosis not present

## 2023-12-27 ENCOUNTER — Telehealth: Payer: Self-pay | Admitting: Cardiology

## 2023-12-27 MED ORDER — APIXABAN 2.5 MG PO TABS: 2.5000 mg | ORAL_TABLET | Freq: Two times a day (BID) | ORAL | 1 refills | Status: DC

## 2023-12-27 NOTE — Telephone Encounter (Signed)
 Prescription refill request for Eliquis received. Indication: AF Last office visit: 08/20/23  Kandyce Rud MD Scr: 3.06 on 07/13/23  Epic Age: 83 Weight: 60.1kg  Based on above findings Eliquis 2.5mg  twice daily is the appropriate dose.  Refill approved.

## 2023-12-27 NOTE — Telephone Encounter (Signed)
*  STAT* If patient is at the pharmacy, call can be transferred to refill team.   1. Which medications need to be refilled? (please list name of each medication and dose if known)   apixaban (ELIQUIS) 2.5 MG TABS tablet   2. Would you like to learn more about the convenience, safety, & potential cost savings by using the [facility]?   3. Are you open to using the [facility] (Type [facility]. ).  4. Which pharmacy/location (including street and city if local pharmacy) is medication to be sent to?  [id number] - , [place] - [address] AT [facility] OF [address]   5. Do they need a 30 day or 90 day supply?   90 day  Daughter Byrd Hesselbach) stated patient is completely out of this medication.

## 2023-12-28 DIAGNOSIS — E1122 Type 2 diabetes mellitus with diabetic chronic kidney disease: Secondary | ICD-10-CM | POA: Diagnosis not present

## 2023-12-28 DIAGNOSIS — Z992 Dependence on renal dialysis: Secondary | ICD-10-CM | POA: Diagnosis not present

## 2023-12-28 DIAGNOSIS — N186 End stage renal disease: Secondary | ICD-10-CM | POA: Diagnosis not present

## 2023-12-31 ENCOUNTER — Telehealth: Payer: Self-pay | Admitting: Emergency Medicine

## 2023-12-31 DIAGNOSIS — Z992 Dependence on renal dialysis: Secondary | ICD-10-CM | POA: Diagnosis not present

## 2023-12-31 DIAGNOSIS — N2581 Secondary hyperparathyroidism of renal origin: Secondary | ICD-10-CM | POA: Diagnosis not present

## 2023-12-31 DIAGNOSIS — N186 End stage renal disease: Secondary | ICD-10-CM | POA: Diagnosis not present

## 2023-12-31 NOTE — Telephone Encounter (Signed)
 Copied from CRM [id number]. Topic: Clinical - Prescription Issue >> Dec 31, 2023  3:46 PM Tiffany H wrote: Reason for Triage: Patient's daughter called to advise that [facility] will be sending Prior Authorization for [employer]. Please look out for it.   Patient recently changed insurance so new Prior Authorization is needed.

## 2024-01-07 DIAGNOSIS — N186 End stage renal disease: Secondary | ICD-10-CM | POA: Diagnosis not present

## 2024-01-07 DIAGNOSIS — D631 Anemia in chronic kidney disease: Secondary | ICD-10-CM | POA: Diagnosis not present

## 2024-01-07 DIAGNOSIS — N2581 Secondary hyperparathyroidism of renal origin: Secondary | ICD-10-CM | POA: Diagnosis not present

## 2024-01-07 DIAGNOSIS — Z992 Dependence on renal dialysis: Secondary | ICD-10-CM | POA: Diagnosis not present

## 2024-01-10 ENCOUNTER — Telehealth: Payer: Self-pay

## 2024-01-10 DIAGNOSIS — E1065 Type 1 diabetes mellitus with hyperglycemia: Secondary | ICD-10-CM | POA: Diagnosis not present

## 2024-01-10 DIAGNOSIS — E039 Hypothyroidism, unspecified: Secondary | ICD-10-CM | POA: Diagnosis not present

## 2024-01-10 DIAGNOSIS — E113513 Type 2 diabetes mellitus with proliferative diabetic retinopathy with macular edema, bilateral: Secondary | ICD-10-CM | POA: Diagnosis not present

## 2024-01-10 NOTE — Telephone Encounter (Signed)
 Pharmacy Patient Advocate Encounter   Received notification from Onbase that prior authorization for Dexcom G6 Sensor is required/requested.   Insurance verification completed.   The patient is insured through [facility] ADVANTAGE/RX ADVANCE .   Per test claim: PA required; PA submitted to above mentioned insurance via CoverMyMeds Key/confirmation #/EOC [facility] Status is pending

## 2024-01-14 DIAGNOSIS — D631 Anemia in chronic kidney disease: Secondary | ICD-10-CM | POA: Diagnosis not present

## 2024-01-14 DIAGNOSIS — N2581 Secondary hyperparathyroidism of renal origin: Secondary | ICD-10-CM | POA: Diagnosis not present

## 2024-01-14 DIAGNOSIS — N186 End stage renal disease: Secondary | ICD-10-CM | POA: Diagnosis not present

## 2024-01-14 DIAGNOSIS — Z992 Dependence on renal dialysis: Secondary | ICD-10-CM | POA: Diagnosis not present

## 2024-01-14 NOTE — Telephone Encounter (Signed)
 Pharmacy Patient Advocate Encounter  Received notification from [facility] ADVANTAGE/RX ADVANCE that Prior Authorization for Dexcom G6 Sensor  has been  DENIED under Medicare Part D, but APPROVED under Medicare Part B.   PA #/Case ID/Reference #: [id number]

## 2024-01-20 ENCOUNTER — Encounter (HOSPITAL_COMMUNITY): Payer: Self-pay | Admitting: *Deleted

## 2024-01-20 ENCOUNTER — Inpatient Hospital Stay (HOSPITAL_COMMUNITY)
Admission: EM | Admit: 2024-01-20 | Discharge: 2024-01-23 | DRG: 689 | Disposition: A | Discharge status: Home / Self Care | Attending: Family Medicine | Admitting: Family Medicine

## 2024-01-20 ENCOUNTER — Emergency Department (HOSPITAL_COMMUNITY)

## 2024-01-20 ENCOUNTER — Encounter: Payer: Self-pay | Admitting: Hematology

## 2024-01-20 ENCOUNTER — Other Ambulatory Visit: Payer: Self-pay

## 2024-01-20 DIAGNOSIS — Z8616 Personal history of COVID-19: Secondary | ICD-10-CM

## 2024-01-20 DIAGNOSIS — Z90710 Acquired absence of both cervix and uterus: Secondary | ICD-10-CM

## 2024-01-20 DIAGNOSIS — N12 Tubulo-interstitial nephritis, not specified as acute or chronic: Secondary | ICD-10-CM | POA: Diagnosis not present

## 2024-01-20 DIAGNOSIS — N2581 Secondary hyperparathyroidism of renal origin: Secondary | ICD-10-CM | POA: Diagnosis present

## 2024-01-20 DIAGNOSIS — R0602 Shortness of breath: Secondary | ICD-10-CM | POA: Diagnosis not present

## 2024-01-20 DIAGNOSIS — Z860101 Personal history of adenomatous and serrated colon polyps: Secondary | ICD-10-CM

## 2024-01-20 DIAGNOSIS — Z880 Allergy status to penicillin: Secondary | ICD-10-CM

## 2024-01-20 DIAGNOSIS — Z9641 Presence of insulin pump (external) (internal): Secondary | ICD-10-CM | POA: Diagnosis present

## 2024-01-20 DIAGNOSIS — E119 Type 2 diabetes mellitus without complications: Secondary | ICD-10-CM | POA: Diagnosis not present

## 2024-01-20 DIAGNOSIS — D631 Anemia in chronic kidney disease: Secondary | ICD-10-CM | POA: Diagnosis present

## 2024-01-20 DIAGNOSIS — N184 Chronic kidney disease, stage 4 (severe): Secondary | ICD-10-CM | POA: Diagnosis not present

## 2024-01-20 DIAGNOSIS — E109 Type 1 diabetes mellitus without complications: Secondary | ICD-10-CM | POA: Diagnosis present

## 2024-01-20 DIAGNOSIS — Z9842 Cataract extraction status, left eye: Secondary | ICD-10-CM

## 2024-01-20 DIAGNOSIS — Z9012 Acquired absence of left breast and nipple: Secondary | ICD-10-CM

## 2024-01-20 DIAGNOSIS — N390 Urinary tract infection, site not specified: Secondary | ICD-10-CM

## 2024-01-20 DIAGNOSIS — I480 Paroxysmal atrial fibrillation: Secondary | ICD-10-CM | POA: Diagnosis present

## 2024-01-20 DIAGNOSIS — E039 Hypothyroidism, unspecified: Secondary | ICD-10-CM | POA: Diagnosis present

## 2024-01-20 DIAGNOSIS — I700 Atherosclerosis of aorta: Secondary | ICD-10-CM | POA: Diagnosis present

## 2024-01-20 DIAGNOSIS — Z87440 Personal history of urinary (tract) infections: Secondary | ICD-10-CM

## 2024-01-20 DIAGNOSIS — E1122 Type 2 diabetes mellitus with diabetic chronic kidney disease: Secondary | ICD-10-CM | POA: Diagnosis not present

## 2024-01-20 DIAGNOSIS — Z794 Long term (current) use of insulin: Secondary | ICD-10-CM

## 2024-01-20 DIAGNOSIS — M160 Bilateral primary osteoarthritis of hip: Secondary | ICD-10-CM | POA: Diagnosis present

## 2024-01-20 DIAGNOSIS — J9601 Acute respiratory failure with hypoxia: Secondary | ICD-10-CM | POA: Diagnosis present

## 2024-01-20 DIAGNOSIS — Z79890 Hormone replacement therapy (postmenopausal): Secondary | ICD-10-CM

## 2024-01-20 DIAGNOSIS — R918 Other nonspecific abnormal finding of lung field: Secondary | ICD-10-CM | POA: Diagnosis not present

## 2024-01-20 DIAGNOSIS — Z8619 Personal history of other infectious and parasitic diseases: Secondary | ICD-10-CM

## 2024-01-20 DIAGNOSIS — Z9049 Acquired absence of other specified parts of digestive tract: Secondary | ICD-10-CM

## 2024-01-20 DIAGNOSIS — Z833 Family history of diabetes mellitus: Secondary | ICD-10-CM

## 2024-01-20 DIAGNOSIS — I4891 Unspecified atrial fibrillation: Secondary | ICD-10-CM | POA: Diagnosis present

## 2024-01-20 DIAGNOSIS — I5033 Acute on chronic diastolic (congestive) heart failure: Secondary | ICD-10-CM | POA: Diagnosis present

## 2024-01-20 DIAGNOSIS — Z992 Dependence on renal dialysis: Secondary | ICD-10-CM

## 2024-01-20 DIAGNOSIS — I2489 Other forms of acute ischemic heart disease: Secondary | ICD-10-CM | POA: Diagnosis present

## 2024-01-20 DIAGNOSIS — N10 Acute tubulo-interstitial nephritis: Principal | ICD-10-CM | POA: Diagnosis present

## 2024-01-20 DIAGNOSIS — Z9861 Coronary angioplasty status: Secondary | ICD-10-CM

## 2024-01-20 DIAGNOSIS — Z79899 Other long term (current) drug therapy: Secondary | ICD-10-CM

## 2024-01-20 DIAGNOSIS — B961 Klebsiella pneumoniae [K. pneumoniae] as the cause of diseases classified elsewhere: Secondary | ICD-10-CM | POA: Diagnosis present

## 2024-01-20 DIAGNOSIS — Z881 Allergy status to other antibiotic agents status: Secondary | ICD-10-CM

## 2024-01-20 DIAGNOSIS — R109 Unspecified abdominal pain: Secondary | ICD-10-CM | POA: Diagnosis not present

## 2024-01-20 DIAGNOSIS — I2510 Atherosclerotic heart disease of native coronary artery without angina pectoris: Secondary | ICD-10-CM | POA: Diagnosis present

## 2024-01-20 DIAGNOSIS — Z91048 Other nonmedicinal substance allergy status: Secondary | ICD-10-CM

## 2024-01-20 DIAGNOSIS — E785 Hyperlipidemia, unspecified: Secondary | ICD-10-CM | POA: Diagnosis present

## 2024-01-20 DIAGNOSIS — Z882 Allergy status to sulfonamides status: Secondary | ICD-10-CM

## 2024-01-20 DIAGNOSIS — Z8249 Family history of ischemic heart disease and other diseases of the circulatory system: Secondary | ICD-10-CM

## 2024-01-20 DIAGNOSIS — N186 End stage renal disease: Secondary | ICD-10-CM | POA: Diagnosis not present

## 2024-01-20 DIAGNOSIS — Z888 Allergy status to other drugs, medicaments and biological substances status: Secondary | ICD-10-CM

## 2024-01-20 DIAGNOSIS — I509 Heart failure, unspecified: Secondary | ICD-10-CM | POA: Diagnosis not present

## 2024-01-20 DIAGNOSIS — Z800 Family history of malignant neoplasm of digestive organs: Secondary | ICD-10-CM

## 2024-01-20 DIAGNOSIS — E1022 Type 1 diabetes mellitus with diabetic chronic kidney disease: Secondary | ICD-10-CM | POA: Diagnosis present

## 2024-01-20 DIAGNOSIS — I110 Hypertensive heart disease with heart failure: Secondary | ICD-10-CM | POA: Diagnosis not present

## 2024-01-20 DIAGNOSIS — Z9841 Cataract extraction status, right eye: Secondary | ICD-10-CM

## 2024-01-20 DIAGNOSIS — Z7901 Long term (current) use of anticoagulants: Secondary | ICD-10-CM

## 2024-01-20 DIAGNOSIS — E8770 Fluid overload, unspecified: Secondary | ICD-10-CM | POA: Diagnosis present

## 2024-01-20 DIAGNOSIS — I132 Hypertensive heart and chronic kidney disease with heart failure and with stage 5 chronic kidney disease, or end stage renal disease: Secondary | ICD-10-CM | POA: Diagnosis present

## 2024-01-20 DIAGNOSIS — Z853 Personal history of malignant neoplasm of breast: Secondary | ICD-10-CM

## 2024-01-20 HISTORY — DX: Dependence on renal dialysis: Z99.2

## 2024-01-20 HISTORY — DX: Acute pyelonephritis: N10

## 2024-01-20 LAB — CBC
HCT: 38 % (ref 36.0–46.0)
Hemoglobin: 11.8 g/dL — ABNORMAL LOW (ref 12.0–15.0)
MCH: 29.8 pg (ref 26.0–34.0)
MCHC: 31.1 g/dL (ref 30.0–36.0)
MCV: 96 fL (ref 80.0–100.0)
Platelets: 226 10*3/uL (ref 150–400)
RBC: 3.96 MIL/uL (ref 3.87–5.11)
RDW: 14.9 % (ref 11.5–15.5)
WBC: 12.1 10*3/uL — ABNORMAL HIGH (ref 4.0–10.5)
nRBC: 0 % (ref 0.0–0.2)

## 2024-01-20 LAB — URINALYSIS, ROUTINE W REFLEX MICROSCOPIC
Glucose, UA: NEGATIVE mg/dL
Ketones, ur: NEGATIVE mg/dL
Nitrite: NEGATIVE
Protein, ur: >300 mg/dL — AB
Specific Gravity, Urine: 1.015 (ref 1.005–1.030)
pH: 8 (ref 5.0–8.0)

## 2024-01-20 LAB — BASIC METABOLIC PANEL
Anion gap: 13 (ref 5–15)
BUN: 59 mg/dL — ABNORMAL HIGH (ref 8–23)
CO2: 28 mmol/L (ref 22–32)
Calcium: 9.6 mg/dL (ref 8.9–10.3)
Chloride: 94 mmol/L — ABNORMAL LOW (ref 98–111)
Creatinine, Ser: 5.18 mg/dL — ABNORMAL HIGH (ref 0.44–1.00)
GFR, Estimated: 8 mL/min — ABNORMAL LOW (ref >60)
Glucose, Bld: 243 mg/dL — ABNORMAL HIGH (ref 70–99)
Potassium: 4.7 mmol/L (ref 3.5–5.1)
Sodium: 135 mmol/L (ref 135–145)

## 2024-01-20 LAB — URINALYSIS, MICROSCOPIC (REFLEX): WBC, UA: >50 WBC/hpf (ref 0–5)

## 2024-01-20 LAB — BRAIN NATRIURETIC PEPTIDE: B Natriuretic Peptide: 1212.2 pg/mL — ABNORMAL HIGH (ref 0.0–100.0)

## 2024-01-20 LAB — TROPONIN I (HIGH SENSITIVITY)
Troponin I (High Sensitivity): 130 ng/L
Troponin I (High Sensitivity): 133 ng/L (ref <18)

## 2024-01-20 MED ORDER — IOHEXOL 350 MG/ML SOLN
75.0000 mL | Freq: Once | INTRAVENOUS | Status: AC | PRN
  Administered 2024-01-20: 75 mL via INTRAVENOUS

## 2024-01-20 MED ORDER — ONDANSETRON 4 MG PO TBDP: 8.0000 mg | ORAL_TABLET | Freq: Once | ORAL | Status: DC

## 2024-01-20 MED ORDER — PROCHLORPERAZINE EDISYLATE 10 MG/2ML IJ SOLN
10.0000 mg | Freq: Once | INTRAMUSCULAR | Status: AC
  Administered 2024-01-20: 10 mg via INTRAVENOUS
  Filled 2024-01-20: qty 2

## 2024-01-20 MED ORDER — ONDANSETRON HCL 4 MG/2ML IJ SOLN
4.0000 mg | Freq: Once | INTRAMUSCULAR | Status: AC
  Administered 2024-01-20: 4 mg via INTRAVENOUS
  Filled 2024-01-20: qty 2

## 2024-01-20 MED ORDER — FENTANYL CITRATE PF 50 MCG/ML IJ SOSY
50.0000 ug | PREFILLED_SYRINGE | Freq: Once | INTRAMUSCULAR | Status: AC
  Administered 2024-01-20: 50 ug via INTRAVENOUS
  Filled 2024-01-20: qty 1

## 2024-01-20 MED ORDER — SODIUM CHLORIDE 0.9 % IV SOLN
1.0000 g | Freq: Once | INTRAVENOUS | Status: AC
  Administered 2024-01-20: 1 g via INTRAVENOUS
  Filled 2024-01-20 (×2): qty 20

## 2024-01-20 MED ORDER — CHLORHEXIDINE GLUCONATE CLOTH 2 % EX PADS
6.0000 | MEDICATED_PAD | Freq: Every day | CUTANEOUS | Status: DC
Start: 2024-01-21 — End: 2024-01-24
  Administered 2024-01-22 – 2024-01-23 (×2): 6 via TOPICAL

## 2024-01-20 MED ORDER — ONDANSETRON HCL 4 MG/2ML IJ SOLN: 4.0000 mg | Freq: Once | INTRAMUSCULAR | Status: DC

## 2024-01-20 MED ORDER — ONDANSETRON 4 MG PO TBDP
4.0000 mg | ORAL_TABLET | Freq: Once | ORAL | Status: AC
  Administered 2024-01-20: 4 mg via ORAL
  Filled 2024-01-20: qty 1

## 2024-01-20 NOTE — Progress Notes (Signed)
 ED Pharmacy Antibiotic Sign Off An antibiotic consult was received from an ED provider for meropenem per pharmacy dosing for ESBL UTI. A chart review was completed to assess appropriateness. ESBL UTI in 2024, hx of ERSD, presenting with non-specific complaints, including abdominal pain.   The following one time order(s) were placed:  Meropenem 1g x1   Further antibiotic and/or antibiotic pharmacy consults should be ordered by the admitting provider if indicated.   Thank you for allowing pharmacy to be a part of this patient's care.   Estill Batten, PharmD, BCCCP  Clinical Pharmacist 01/20/24 9:54 PM

## 2024-01-20 NOTE — ED Provider Notes (Signed)
 [facility] DIALYSIS UNIT Provider Note  CSN: [id number] Arrival date & time: 01/20/24 1833  Chief Complaint(s) Shortness of Breath  HPI Elizabeth Olsen is a 83 y.o. female with PMH ESRD on hemodialysis Monday Wednesday Friday, T2DM, A-fib on Eliquis, previous ESBL UTIs who presents emergency department for evaluation of dysuria, vomiting, flank pain and shortness of breath.  States that symptoms began today and of significant worsened over the last 24 hours.  Has been compliant with her dialysis and last dialyzed 2 days ago.  She does make a small amount of urine but has difficulty getting the urine out.  Endorses right flank pain and here in the emergency room and is endorsing associated nausea and vomiting.  Denies chest pain, headache, fever or other systemic symptoms.   Past Medical History Past Medical History:  Diagnosis Date   A-fib (HCC)    Acquired hypothyroidism 10/13/2017   Acute diastolic CHF (congestive heart failure) (HCC) 11/20/2019   Acute respiratory failure with hypoxia (HCC) 10/13/2017   Acute systolic heart failure (HCC) 10/13/2017   Anemia    Anemia associated with diabetes mellitus (HCC) 10/13/2017   Anemia of chronic disease 12/10/2017   Blood transfusion    Blood transfusion without reported diagnosis    with breast surgery   Breast cancer (HCC) 15 years ago   left    Cardiomyopathy (HCC) 07/23/2017   Ejection fraction 45-50% may; from September 2018   CHF (congestive heart failure) (HCC)    Chronic cholecystitis with calculus 01/22/2018   Colon polyp 03/2007   adenomatous   COVID-19 virus infection 11/20/2019   Diabetes mellitus    dx 1998.  was told prior to getting chemo that her bld sugar rose.  She thought it would go back   Dyspnea    d/t anemia   ESRD (end stage renal disease) on dialysis (HCC)    HD MWF   Essential hypertension 09/11/2018   GERD (gastroesophageal reflux disease)    Heart murmur    as child   Hernia,  incisional    Hyperlipidemia    Hypertension    Hypothyroidism    Iron deficiency 07/23/2017   Near syncope 04/01/2018   Neuropathy    Osteoarthritis, hip, bilateral    PONV (postoperative nausea and vomiting)    Refractory anemia, unspecified (HCC) 12/10/2017   Short-term memory loss    per pt's daughter   Type 1 diabetes (HCC) 07/23/2017   Vertigo 06/24/2018   Vitamin D deficiency    Patient Active Problem List   Diagnosis Date Noted   Volume overload 01/21/2024   Acute pyelonephritis 01/20/2024   Hemodialysis patient (HCC) 01/20/2024   Moderate protein-calorie malnutrition (HCC) 06/28/2023   Constipation 06/12/2023   Type 2 diabetes mellitus with chronic kidney disease on chronic dialysis, without long-term current use of insulin (HCC) 06/11/2023   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   Iron deficiency anemia, unspecified 05/08/2023   Atherosclerotic heart disease of native coronary artery without angina pectoris 05/08/2023   Allergy, unspecified, initial encounter 05/08/2023   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   Atrial fibrillation with RVR (HCC) 05/05/2023   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) 02/14/2021   ESRD (end stage renal disease) (HCC) 09/21/2020   Congestive heart failure (CHF) (HCC) 04/12/2020   Vitamin D deficiency 10/06/2019   Essential hypertension 09/11/2018   Vertigo 06/24/2018   Anemia of chronic disease 12/10/2017   Acquired hypothyroidism 10/13/2017   Type 1 diabetes (HCC) 07/23/2017  Hyperlipidemia 07/23/2017   Iron deficiency 07/23/2017   Cardiomyopathy (HCC) 07/23/2017   Home Medication(s) Prior to Admission medications   Medication Sig Start Date End Date Taking? Authorizing Provider  acetaminophen (TYLENOL) 325 MG tablet Take by mouth every 6 (six) hours as needed for headache, fever or mild pain (pain score 1-3).   Yes [provider]  apixaban (ELIQUIS) 2.5 MG TABS tablet Take 1 tablet  (2.5 mg total) by mouth 2 (two) times daily. 12/27/23  Yes Georgeanna Lea, MD  ascorbic Acid (VITAMIN C) 500 MG CPCR Take 500 mg by mouth every evening.   Yes [provider]  cloNIDine (CATAPRES) 0.1 MG tablet Take 1 tablet (0.1 mg total) by mouth daily as needed (FOR SYSTOLIC BLOOD PRESSURE GREATER THAN 170 AND OR DIASTOLIC FOR BLOOD PRESSURE GREATER 100). 05/18/23  Yes Bender, Irving Burton, DO  Glucagon (BAQSIMI ONE PACK) 3 MG/DOSE POWD As needed for severe hypoglycemia 01/10/24  Yes [provider]  Insulin Disposable Pump (OMNIPOD 5 [id number] PODS GEN 5) MISC Inject into the skin. 10/28/23  Yes [provider]  insulin glargine (LANTUS SOLOSTAR) 100 UNIT/ML Solostar Pen Inject into the skin as needed. 01/10/24  Yes [provider]  insulin lispro (HUMALOG) 100 UNIT/ML injection USE AS DIRECTED VIA INSULIN PUMP. MAX DAILY DOSAGE IS 50 UNITS 01/10/24  Yes [provider]  levothyroxine (SYNTHROID) 88 MCG tablet Take 1 tablet (88 mcg total) by mouth daily before breakfast. 12/19/23 12/18/24 Yes Sagardia, [name], MD  lidocaine-prilocaine (EMLA) cream Apply topically once. 10/16/23  Yes [provider]  Menthol-Zinc Oxide (BODY POWDER MEDICATED EX) Apply 1 application  topically daily. Apply under breast and stomach   Yes [provider]  metoprolol tartrate (LOPRESSOR) 25 MG tablet Take 12.5 mg by mouth daily as needed (IF HR more than 100 and syst BP more than 160 per daughter).   Yes [provider]  VELPHORO 500 MG chewable tablet Chew 500 mg by mouth 5 (five) times daily as needed. 09/11/23  Yes [provider]  White Petrolatum-Mineral Oil (LUBRICANT EYE NIGHTTIME) OINT Apply 1 Application to eye at bedtime.   Yes [provider]  zinc gluconate 50 MG tablet Take 50 mg by mouth every evening.   Yes [provider]  ondansetron (ZOFRAN) 4 MG tablet Take 1 tablet (4 mg total) by mouth every 8 (eight) hours  as needed for nausea or vomiting. Patient not taking: Reported on 01/21/2024 06/14/23   Rudene Christians, DO                                                                                                                                    Past Surgical History Past Surgical History:  Procedure Laterality Date   A/V FISTULAGRAM N/A 05/16/2023   Procedure: A/V Fistulagram;  Surgeon: Victorino Sparrow, MD;  Location: [facility] INVASIVE CV LAB;  Service: Cardiovascular;  Laterality: N/A;  ABDOMINAL HYSTERECTOMY     APPENDECTOMY     BREAST SURGERY     CAPD REMOVAL N/A 05/31/2023   Procedure: REMOVAL CONTINUOUS AMBULATORY PERITONEAL DIALYSIS  (CAPD) CATHETER;  Surgeon: [name], MD;  Location: [facility] OR;  Service: Vascular;  Laterality: N/A;   CHOLECYSTECTOMY  01/22/2018   LAPROSCOPIC    CHOLECYSTECTOMY N/A 01/22/2018   Procedure: LAPAROSCOPIC CHOLECYSTECTOMY WITH INTRAOPERATIVE CHOLANGIOGRAM;  Surgeon: Manus Rudd, MD;  Location: [facility] OR;  Service: General;  Laterality: N/A;   COLONOSCOPY  2013   due next 03-2017   EYE SURGERY     bil cataracts   IR THROMBECTOMY AV FISTULA W/THROMBOLYSIS/PTA INC/SHUNT/IMG RIGHT Right 09/11/2023   IR US GUIDE VASC ACCESS RIGHT  09/11/2023   IUD REMOVAL     with appendectomy   MASTECTOMY, MODIFIED RADICAL W/RECONSTRUCTION Left 1998   10 nodes out   PERIPHERAL VASCULAR INTERVENTION  05/16/2023   Procedure: PERIPHERAL VASCULAR INTERVENTION;  Surgeon: Victorino Sparrow, MD;  Location: [facility] INVASIVE CV LAB;  Service: Cardiovascular;;   THORACENTESIS Right 05/17/2023   Procedure: THORACENTESIS;  Surgeon: Lorin Glass, MD;  Location: [facility] ENDOSCOPY;  Service: Pulmonary;  Laterality: Right;   TUMOR EXCISION Left    x 2, neck, head   Family History Family History  Problem Relation Age of Onset   Diabetes Other        both sides of family   Hypertension Father    Congestive Heart Failure Father    Peripheral vascular disease Father    Hypertension Maternal  Grandfather    Hypertension Maternal Grandmother    Stomach cancer Maternal Grandmother        GGM   Heart attack Brother    Colon cancer Neg Hx    Esophageal cancer Neg Hx    Pancreatic cancer Neg Hx    Rectal cancer Neg Hx     Social History Social History   Tobacco Use   Smoking status: Never   Smokeless tobacco: Never  Vaping Use   Vaping status: Never Used  Substance Use Topics   Alcohol use: No   Drug use: No   Allergies Erythromycin, Penicillins, Tape, Dextromethorphan, Doxylamine-dm, Nyquil [pseudoeph-doxylamine-dm-apap], and Sulfa antibiotics  Review of Systems Review of Systems  Respiratory:  Positive for shortness of breath.   Gastrointestinal:  Positive for abdominal pain, nausea and vomiting.  Genitourinary:  Positive for flank pain.    Physical Exam Vital Signs  I have reviewed the triage vital signs BP (!) 138/39   Pulse 78   Temp 98.6 F (37 C)   Resp 16   Ht 5\' 3"  (1.6 m)   Wt 60.9 kg   SpO2 99%   BMI 23.78 kg/m   Physical Exam Vitals and nursing note reviewed.  Constitutional:      General: She is not in acute distress.    Appearance: She is well-developed.  HENT:     Head: Normocephalic and atraumatic.  Eyes:     Conjunctiva/sclera: Conjunctivae normal.  Cardiovascular:     Rate and Rhythm: Normal rate and regular rhythm.     Heart sounds: No murmur heard. Pulmonary:     Effort: Pulmonary effort is normal. No respiratory distress.     Breath sounds: Normal breath sounds.  Chest:     Chest wall: Tenderness present.  Abdominal:     Palpations: Abdomen is soft.     Tenderness: There is no abdominal tenderness.  Musculoskeletal:        General: No  swelling.     Cervical back: Neck supple.  Skin:    General: Skin is warm and dry.     Capillary Refill: Capillary refill takes less than 2 seconds.  Neurological:     Mental Status: She is alert.  Psychiatric:        Mood and Affect: Mood normal.     ED Results and  Treatments Labs (all labs ordered are listed, but only abnormal results are displayed) Labs Reviewed  BASIC METABOLIC PANEL - Abnormal; Notable for the following components:      Result Value   Chloride 94 (*)    Glucose, Bld 243 (*)    BUN 59 (*)    Creatinine, Ser 5.18 (*)    GFR, Estimated 8 (*)    All other components within normal limits  CBC - Abnormal; Notable for the following components:   WBC 12.1 (*)    Hemoglobin 11.8 (*)    All other components within normal limits  BRAIN NATRIURETIC PEPTIDE - Abnormal; Notable for the following components:   B Natriuretic Peptide 1,212.2 (*)    All other components within normal limits  URINALYSIS, ROUTINE W REFLEX MICROSCOPIC - Abnormal; Notable for the following components:   Hgb urine dipstick LARGE (*)    Bilirubin Urine SMALL (*)    Protein, ur >300 (*)    Leukocytes,Ua MODERATE (*)    All other components within normal limits  URINALYSIS, MICROSCOPIC (REFLEX) - Abnormal; Notable for the following components:   Bacteria, UA MANY (*)    All other components within normal limits  RENAL FUNCTION PANEL - Abnormal; Notable for the following components:   Sodium 134 (*)    Potassium 5.3 (*)    Chloride 95 (*)    Glucose, Bld 271 (*)    BUN 69 (*)    Creatinine, Ser 5.77 (*)    Phosphorus 5.0 (*)    GFR, Estimated 7 (*)    All other components within normal limits  CBC - Abnormal; Notable for the following components:   WBC 16.1 (*)    RBC 3.84 (*)    Hemoglobin 11.6 (*)    All other components within normal limits  CBG MONITORING, ED - Abnormal; Notable for the following components:   Glucose-Capillary 250 (*)    All other components within normal limits  TROPONIN I (HIGH SENSITIVITY) - Abnormal; Notable for the following components:   Troponin I (High Sensitivity) 130 (*)    All other components within normal limits  TROPONIN I (HIGH SENSITIVITY) - Abnormal; Notable for the following components:   Troponin I (High  Sensitivity) 133 (*)    All other components within normal limits  TROPONIN I (HIGH SENSITIVITY) - Abnormal; Notable for the following components:   Troponin I (High Sensitivity) 149 (*)    All other components within normal limits  URINE CULTURE  HEPATITIS B SURFACE ANTIGEN  HEPATITIS B SURFACE ANTIBODY, QUANTITATIVE  Radiology CT ABDOMEN PELVIS W CONTRAST Result Date: 01/20/2024 CLINICAL DATA:  Abdominal pain and shortness of breath, initial encounter EXAM: CT ABDOMEN AND PELVIS WITH CONTRAST TECHNIQUE: Multidetector CT imaging of the abdomen and pelvis was performed using the standard protocol following bolus administration of intravenous contrast. RADIATION DOSE REDUCTION: This exam was performed according to the departmental dose-optimization program which includes automated exposure control, adjustment of the mA and/or kV according to patient size and/or use of iterative reconstruction technique. CONTRAST:  75mL OMNIPAQUE IOHEXOL 350 MG/ML SOLN COMPARISON:  06/11/2023 FINDINGS: Lower chest: Small right-sided pleural effusion is noted. No focal infiltrate or parenchymal nodule is seen. Postsurgical changes in the left breast are noted. Hepatobiliary: No focal liver abnormality is seen. Status post cholecystectomy. No biliary dilatation. Pancreas: Unremarkable. No pancreatic ductal dilatation or surrounding inflammatory changes. Spleen: Normal in size without focal abnormality. Adrenals/Urinary Tract: Adrenal glands are within normal limits. Kidneys are well visualized within normal enhancement pattern bilaterally. Hydronephrotic changes are noted on the right with enhancement of the walls of the collecting system and ureter consistent with underlying UTI. No definitive changes of pyelonephritis are seen. The bladder is decompressed. Stomach/Bowel: No obstructive or inflammatory  changes of the colon are noted. The appendix is not well visualized consistent with a prior surgical history. Small bowel and stomach appear within normal limits. Vascular/Lymphatic: Aortic atherosclerosis. No enlarged abdominal or pelvic lymph nodes. Reproductive: Status post hysterectomy. No adnexal masses. Other: No abdominal wall hernia or abnormality. No abdominopelvic ascites. Musculoskeletal: No acute or significant osseous findings. IMPRESSION: Changes most consistent with UTI with dilatation of the right renal collecting system and ureter with enhancement of the walls. No discrete stone is identified. No parenchymal findings to suggest pyelonephritis are seen. Small right-sided pleural effusion. Electronically Signed   By: Alcide Clever M.D.   On: 01/20/2024 21:40   DG Chest Portable 1 View Result Date: 01/20/2024 CLINICAL DATA:  sob dialysis pt. SOB dialysis pt Per triage notes: "Shortness of breath today with abd pain nausea also fistula rt arm she was last dialyzed Friday" Pt has hx of CHF, diabetes, and htn. EXAM: PORTABLE CHEST 1 VIEW COMPARISON:  Chest x-ray 07/13/2023 FINDINGS: The heart and mediastinal contours are unchanged. Atherosclerotic plaque. Slight patchy airspace opacities. Increased interstitial markings. No pleural effusion. No pneumothorax. No acute osseous abnormality.  Left chest wall vascular clips. IMPRESSION: 1. Cardiomegaly with mild pulmonary edema with superimposed infection not excluded. 2.  Aortic Atherosclerosis (ICD10-I70.0). Electronically Signed   By: Tish Frederickson M.D.   On: 01/20/2024 19:43    Pertinent labs & imaging results that were available during my care of the patient were reviewed by me and considered in my medical decision making (see MDM for details).  Medications Ordered in ED Medications  Chlorhexidine Gluconate Cloth 2 % PADS 6 each (6 each Topical Not Given 01/21/24 0600)  apixaban (ELIQUIS) tablet 2.5 mg (2.5 mg Oral Given 01/21/24 1113)   prochlorperazine (COMPAZINE) injection 5 mg (has no administration in time range)  cloNIDine (CATAPRES) tablet 0.1 mg (has no administration in time range)  levothyroxine (SYNTHROID) tablet 88 mcg (88 mcg Oral Given 01/21/24 0635)  insulin pump ( Subcutaneous Given 01/21/24 0800)  HYDROmorphone (DILAUDID) injection 0.5 mg (0.5 mg Intravenous Given 01/21/24 0308)  meropenem (MERREM) 500 mg in sodium chloride 0.9 % 100 mL IVPB (has no administration in time range)  metoprolol tartrate (LOPRESSOR) tablet 12.5 mg (has no administration in time range)  ondansetron (ZOFRAN-ODT) disintegrating tablet 4 mg (4 mg  Oral Given 01/20/24 1901)  fentaNYL (SUBLIMAZE) injection 50 mcg (50 mcg Intravenous Given 01/20/24 2051)  ondansetron (ZOFRAN) injection 4 mg (4 mg Intravenous Given 01/20/24 2054)  iohexol (OMNIPAQUE) 350 MG/ML injection 75 mL (75 mLs Intravenous Contrast Given 01/20/24 2122)  meropenem (MERREM) 1 g in sodium chloride 0.9 % 100 mL IVPB (0 g Intravenous Stopped 01/21/24 0145)  prochlorperazine (COMPAZINE) injection 10 mg (10 mg Intravenous Given 01/20/24 2245)  HYDROmorphone (DILAUDID) injection 0.3 mg (0.3 mg Intravenous Given 01/21/24 0204)  aspirin chewable tablet 324 mg (324 mg Oral Given 01/21/24 0307)  furosemide (LASIX) injection 40 mg (40 mg Intravenous Given 01/21/24 0310)                                                                                                                                     Procedures .Critical Care  Performed by: Glendora Score, MD Authorized by: Glendora Score, MD   Critical care provider statement:    Critical care time (minutes):  30   Critical care was necessary to treat or prevent imminent or life-threatening deterioration of the following conditions:  Cardiac failure and respiratory failure   Critical care was time spent personally by me on the following activities:  Development of treatment plan with patient or surrogate, discussions with consultants,  evaluation of patient's response to treatment, examination of patient, ordering and review of laboratory studies, ordering and review of radiographic studies, ordering and performing treatments and interventions, pulse oximetry, re-evaluation of patient's condition and review of old charts   (including critical care time)  Medical Decision Making / ED Course   This patient presents to the ED for concern of shortness of breath, abdominal pain, flank pain, this involves an extensive number of treatment options, and is a complaint that carries with it a high risk of complications and morbidity.  The differential diagnosis includes nephrolithiasis, pyelonephritis, obstruction, AAA, musculoskeletal strain, vertebral fracture, intra-abdominal abscess, diverticulitis, fluid overload, pneumonia, pleural effusion  MDM: Patient seen in the emergency room for evaluation of multiple complaints described above.  Physical exam with tenderness in the suprapubic region, right flank pain, rales at the bases.  Laboratory evaluation with leukocytosis to 12.1 hemoglobin 11.8 BUN 59, creatinine 5.18, BNP 1212, urinalysis concerning for infection, initial high-sensitivity troponin 130.  Chest x-ray mild pulmonary edema and cardiomegaly.  CT abdomen pelvis concerning for cystitis with right-sided tortuosity and dilatation of the ureter but no obvious pyelonephritis or stone.  Spoke with Dr. Lafonda Mosses of urology who is recommending treating for pyelonephritis and monitoring possibly via renal ultrasound but no acute interventions.  Patient growing ESBL that is multidrug-resistant on previous culture data and thus will require meropenem.  Also spoke with Dr. Arta Silence of nephrology who will help arrange inpatient dialysis.  Patient admitted   Additional history obtained: -Additional history obtained from caregiver, daughter -External records from outside source obtained and reviewed including: Chart review including previous  notes,  labs, imaging, consultation notes   Lab Tests: -I ordered, reviewed, and interpreted labs.   The pertinent results include:   Labs Reviewed  BASIC METABOLIC PANEL - Abnormal; Notable for the following components:      Result Value   Chloride 94 (*)    Glucose, Bld 243 (*)    BUN 59 (*)    Creatinine, Ser 5.18 (*)    GFR, Estimated 8 (*)    All other components within normal limits  CBC - Abnormal; Notable for the following components:   WBC 12.1 (*)    Hemoglobin 11.8 (*)    All other components within normal limits  BRAIN NATRIURETIC PEPTIDE - Abnormal; Notable for the following components:   B Natriuretic Peptide 1,212.2 (*)    All other components within normal limits  URINALYSIS, ROUTINE W REFLEX MICROSCOPIC - Abnormal; Notable for the following components:   Hgb urine dipstick LARGE (*)    Bilirubin Urine SMALL (*)    Protein, ur >300 (*)    Leukocytes,Ua MODERATE (*)    All other components within normal limits  URINALYSIS, MICROSCOPIC (REFLEX) - Abnormal; Notable for the following components:   Bacteria, UA MANY (*)    All other components within normal limits  RENAL FUNCTION PANEL - Abnormal; Notable for the following components:   Sodium 134 (*)    Potassium 5.3 (*)    Chloride 95 (*)    Glucose, Bld 271 (*)    BUN 69 (*)    Creatinine, Ser 5.77 (*)    Phosphorus 5.0 (*)    GFR, Estimated 7 (*)    All other components within normal limits  CBC - Abnormal; Notable for the following components:   WBC 16.1 (*)    RBC 3.84 (*)    Hemoglobin 11.6 (*)    All other components within normal limits  CBG MONITORING, ED - Abnormal; Notable for the following components:   Glucose-Capillary 250 (*)    All other components within normal limits  TROPONIN I (HIGH SENSITIVITY) - Abnormal; Notable for the following components:   Troponin I (High Sensitivity) 130 (*)    All other components within normal limits  TROPONIN I (HIGH SENSITIVITY) - Abnormal; Notable for  the following components:   Troponin I (High Sensitivity) 133 (*)    All other components within normal limits  TROPONIN I (HIGH SENSITIVITY) - Abnormal; Notable for the following components:   Troponin I (High Sensitivity) 149 (*)    All other components within normal limits  URINE CULTURE  HEPATITIS B SURFACE ANTIGEN  HEPATITIS B SURFACE ANTIBODY, QUANTITATIVE      EKG   EKG Interpretation Date/Time:  Sunday January 20 2024 18:47:29 EDT Ventricular Rate:  85 PR Interval:  192 QRS Duration:  96 QT Interval:  408 QTC Calculation: 485 R Axis:   132  Text Interpretation: Normal sinus rhythm Abnormal ECG When compared with ECG of 13-Jul-2023 14:57, PREVIOUS ECG IS PRESENT Confirmed by Berneita Sanagustin ([id number]) on 01/20/2024 7:26:55 PM         Imaging Studies ordered: I ordered imaging studies including chest x-ray, CT head and pelvis I independently visualized and interpreted imaging. I agree with the radiologist interpretation   Medicines ordered and prescription drug management: Meds ordered this encounter  Medications   DISCONTD: ondansetron (ZOFRAN) injection 4 mg   DISCONTD: ondansetron (ZOFRAN-ODT) disintegrating tablet 8 mg   ondansetron (ZOFRAN-ODT) disintegrating tablet 4 mg   fentaNYL (SUBLIMAZE) injection 50 mcg   ondansetron (ZOFRAN)  injection 4 mg   iohexol (OMNIPAQUE) 350 MG/ML injection 75 mL   meropenem (MERREM) 1 g in sodium chloride 0.9 % 100 mL IVPB    Antibiotic Indication::   ESBL Infection   Chlorhexidine Gluconate Cloth 2 % PADS 6 each   prochlorperazine (COMPAZINE) injection 10 mg   apixaban (ELIQUIS) tablet 2.5 mg   prochlorperazine (COMPAZINE) injection 5 mg   cloNIDine (CATAPRES) tablet 0.1 mg   levothyroxine (SYNTHROID) tablet 88 mcg   insulin pump   HYDROmorphone (DILAUDID) injection 0.3 mg   DISCONTD: HYDROmorphone (DILAUDID) injection 0.3 mg   aspirin chewable tablet 324 mg   furosemide (LASIX) injection 40 mg   HYDROmorphone (DILAUDID)  injection 0.5 mg   meropenem (MERREM) 500 mg in sodium chloride 0.9 % 100 mL IVPB    Antibiotic Indication::   ESBL Infection   metoprolol tartrate (LOPRESSOR) tablet 12.5 mg    -I have reviewed the patients home medicines and have made adjustments as needed  Critical interventions Nephrology consultation, IV antibiotics  Consultations Obtained: I requested consultation with the urologist on-call, nephrologist on-call,  and discussed lab and imaging findings as well as pertinent plan - they recommend: Pyelonephritis treatment, inpatient dialysis   Cardiac Monitoring: The patient was maintained on a cardiac monitor.  I personally viewed and interpreted the cardiac monitored which showed an underlying rhythm of: NSR  Social Determinants of Health:  Factors impacting patients care include: Spanish-speaking, interpreter used   Reevaluation: After the interventions noted above, I reevaluated the patient and found that they have :improved  Co morbidities that complicate the patient evaluation  Past Medical History:  Diagnosis Date   A-fib (HCC)    Acquired hypothyroidism 10/13/2017   Acute diastolic CHF (congestive heart failure) (HCC) 11/20/2019   Acute respiratory failure with hypoxia (HCC) 10/13/2017   Acute systolic heart failure (HCC) 10/13/2017   Anemia    Anemia associated with diabetes mellitus (HCC) 10/13/2017   Anemia of chronic disease 12/10/2017   Blood transfusion    Blood transfusion without reported diagnosis    with breast surgery   Breast cancer (HCC) 15 years ago   left    Cardiomyopathy (HCC) 07/23/2017   Ejection fraction 45-50% may; from September 2018   CHF (congestive heart failure) (HCC)    Chronic cholecystitis with calculus 01/22/2018   Colon polyp 03/2007   adenomatous   COVID-19 virus infection 11/20/2019   Diabetes mellitus    dx 1998.  was told prior to getting chemo that her bld sugar rose.  She thought it would go back   Dyspnea    d/t  anemia   ESRD (end stage renal disease) on dialysis (HCC)    HD MWF   Essential hypertension 09/11/2018   GERD (gastroesophageal reflux disease)    Heart murmur    as child   Hernia, incisional    Hyperlipidemia    Hypertension    Hypothyroidism    Iron deficiency 07/23/2017   Near syncope 04/01/2018   Neuropathy    Osteoarthritis, hip, bilateral    PONV (postoperative nausea and vomiting)    Refractory anemia, unspecified (HCC) 12/10/2017   Short-term memory loss    per pt's daughter   Type 1 diabetes (HCC) 07/23/2017   Vertigo 06/24/2018   Vitamin D deficiency       Dispostion: I considered admission for this patient, and patient will require hospital admission for multidrug-resistant UTI requiring meropenem and fluid overload with NSTEMI     Final Clinical Impression(s) /  ED Diagnoses Final diagnoses:  Pyelonephritis     @PCDICTATION @    Glendora Score, MD 01/21/24 [phone]

## 2024-01-20 NOTE — ED Notes (Signed)
 This nurse attempt to get an IV twice, both attempts were unsuccessful.

## 2024-01-20 NOTE — H&P (Signed)
 History and Physical    Patient: Elizabeth Olsen:[id number] DOB: 02-Aug-1941 DOA: 01/20/2024 DOS: the patient was seen and examined on 01/20/2024 PCP: Georgina Quint, MD  Patient coming from: Home  Chief Complaint:  Chief Complaint  Patient presents with   Shortness of Breath   HPI: Elizabeth Olsen is a 83 y.o. female with medical history significant for hemodialysis on Monday Wednesday Friday, insulin requiring diabetes mellitus, constipation, history of PTCA x 3 in 2021, history of breast cancer 1998, and afib on Eliquis who was brought in because of acute nausea without vomiting at around noon.  She also developed significant shortness of breath.  Her evaluation in the emergency department included a CT of her abdomen and pelvis which revealed dilation of the right urinary collecting system which is consistent with infection.   The patient has a history of frequent UTIs and her last UTI was ESBL. She was placed on IV Merrem in the emergency department.  She is also significantly short of breath though her O2 sats are 100% on room air.  Her lungs reveal wet Rales bilaterally.  She is due for hemodialysis in the morning.   Review of Systems: As mentioned in the history of present illness. All other systems reviewed and are negative. Past Medical History:  Diagnosis Date   A-fib (HCC)    Acquired hypothyroidism 10/13/2017   Acute diastolic CHF (congestive heart failure) (HCC) 11/20/2019   Acute respiratory failure with hypoxia (HCC) 10/13/2017   Acute systolic heart failure (HCC) 10/13/2017   Anemia    Anemia associated with diabetes mellitus (HCC) 10/13/2017   Anemia of chronic disease 12/10/2017   Blood transfusion    Blood transfusion without reported diagnosis    with breast surgery   Breast cancer (HCC) 15 years ago   left    Cardiomyopathy (HCC) 07/23/2017   Ejection fraction 45-50% may; from September 2018   CHF (congestive heart failure) (HCC)    Chronic  cholecystitis with calculus 01/22/2018   Colon polyp 03/2007   adenomatous   COVID-19 virus infection 11/20/2019   Diabetes mellitus    dx 1998.  was told prior to getting chemo that her bld sugar rose.  She thought it would go back   Dyspnea    d/t anemia   ESRD (end stage renal disease) on dialysis (HCC)    HD MWF   Essential hypertension 09/11/2018   GERD (gastroesophageal reflux disease)    Heart murmur    as child   Hernia, incisional    Hyperlipidemia    Hypertension    Hypothyroidism    Iron deficiency 07/23/2017   Near syncope 04/01/2018   Neuropathy    Osteoarthritis, hip, bilateral    PONV (postoperative nausea and vomiting)    Refractory anemia, unspecified (HCC) 12/10/2017   Short-term memory loss    per pt's daughter   Type 1 diabetes (HCC) 07/23/2017   Vertigo 06/24/2018   Vitamin D deficiency    Past Surgical History:  Procedure Laterality Date   A/V FISTULAGRAM N/A 05/16/2023   Procedure: A/V Fistulagram;  Surgeon: Victorino Sparrow, MD;  Location: [facility] INVASIVE CV LAB;  Service: Cardiovascular;  Laterality: N/A;   ABDOMINAL HYSTERECTOMY     APPENDECTOMY     BREAST SURGERY     CAPD REMOVAL N/A 05/31/2023   Procedure: REMOVAL CONTINUOUS AMBULATORY PERITONEAL DIALYSIS  (CAPD) CATHETER;  Surgeon: [name], MD;  Location: [facility] OR;  Service: Vascular;  Laterality: N/A;   CHOLECYSTECTOMY  01/22/2018   LAPROSCOPIC    CHOLECYSTECTOMY N/A 01/22/2018   Procedure: LAPAROSCOPIC CHOLECYSTECTOMY WITH INTRAOPERATIVE CHOLANGIOGRAM;  Surgeon: Manus Rudd, MD;  Location: [facility] OR;  Service: General;  Laterality: N/A;   COLONOSCOPY  2013   due next 03-2017   EYE SURGERY     bil cataracts   IR THROMBECTOMY AV FISTULA W/THROMBOLYSIS/PTA INC/SHUNT/IMG RIGHT Right 09/11/2023   IR US GUIDE VASC ACCESS RIGHT  09/11/2023   IUD REMOVAL     with appendectomy   MASTECTOMY, MODIFIED RADICAL W/RECONSTRUCTION Left 1998   10 nodes out   PERIPHERAL VASCULAR INTERVENTION  05/16/2023    Procedure: PERIPHERAL VASCULAR INTERVENTION;  Surgeon: Victorino Sparrow, MD;  Location: [facility] INVASIVE CV LAB;  Service: Cardiovascular;;   THORACENTESIS Right 05/17/2023   Procedure: THORACENTESIS;  Surgeon: Lorin Glass, MD;  Location: [facility] ENDOSCOPY;  Service: Pulmonary;  Laterality: Right;   TUMOR EXCISION Left    x 2, neck, head   Social History:  reports that she has never smoked. She has never used smokeless tobacco. She reports that she does not drink alcohol and does not use drugs.  Allergies  Allergen Reactions   Erythromycin Shortness Of Breath and Swelling    "throat swelling", sob, thrashing ,    Penicillins Rash   Tape Other (See Comments)    Patient states that she prefers paper tape because other tapes tear skin.   Dextromethorphan Rash   Doxylamine-Dm Rash   Nyquil [Pseudoeph-Doxylamine-Dm-Apap] Rash   Sulfa Antibiotics Rash    Family History  Problem Relation Age of Onset   Diabetes Other        both sides of family   Hypertension Father    Congestive Heart Failure Father    Peripheral vascular disease Father    Hypertension Maternal Grandfather    Hypertension Maternal Grandmother    Stomach cancer Maternal Grandmother        GGM   Heart attack Brother    Colon cancer Neg Hx    Esophageal cancer Neg Hx    Pancreatic cancer Neg Hx    Rectal cancer Neg Hx     Prior to Admission medications   Medication Sig Start Date End Date Taking? Authorizing Provider  BAQSIMI TWO PACK 3 MG/DOSE POWD Place into both nostrils as needed. 01/10/24  Yes [provider]  insulin glargine (LANTUS SOLOSTAR) 100 UNIT/ML Solostar Pen Inject into the skin. 01/10/24  Yes [provider]  insulin lispro (HUMALOG) 100 UNIT/ML injection USE AS DIRECTED VIA INSULIN PUMP. MAX DAILY DOSAGE IS 50 UNITS 01/10/24  Yes [provider]  apixaban (ELIQUIS) 2.5 MG TABS tablet Take 1 tablet (2.5 mg total) by mouth 2 (two) times daily. 12/27/23   Georgeanna Lea, MD   cloNIDine (CATAPRES) 0.1 MG tablet Take 1 tablet (0.1 mg total) by mouth daily as needed (FOR SYSTOLIC BLOOD PRESSURE GREATER THAN 170 AND OR DIASTOLIC FOR BLOOD PRESSURE GREATER 100). 05/18/23   Faith Rogue, DO  donepezil (ARICEPT) 5 MG tablet Take 1 tablet (5 mg total) by mouth daily. Patient not taking: Reported on 10/16/2023 06/19/23   Marcos Eke, PA-C  Insulin Disposable Pump (OMNIPOD DASH 5 PACK PODS) MISC 1 each by Other route daily. 05/11/20   [provider]  levothyroxine (SYNTHROID) 88 MCG tablet Take 1 tablet (88 mcg total) by mouth daily before breakfast. 12/19/23 12/18/24  Georgina Quint, MD  metoprolol tartrate (LOPRESSOR) 25 MG tablet Take 12.5 mg by mouth daily as needed (IF HR  more than 100 and syst BP more than 160 per daughter).    [provider]  ondansetron (ZOFRAN) 4 MG tablet Take 1 tablet (4 mg total) by mouth every 8 (eight) hours as needed for nausea or vomiting. Patient not taking: Reported on 10/16/2023 06/14/23   Masters, Florentina Addison, DO  senna (SENOKOT) 8.6 MG TABS tablet Take 2 tablets (17.2 mg total) by mouth daily as needed for mild constipation. 06/14/23   Masters, Katie, DO  VELPHORO 500 MG chewable tablet Chew 500 mg by mouth 5 (five) times daily as needed. 09/11/23   [provider]    Physical Exam: Vitals:   01/20/24 1843 01/20/24 1847 01/20/24 2215 01/20/24 2243  BP: (!) 210/77  (!) 187/95 (!) 185/90  Pulse: 84  93 93  Resp: (!) 28  (!) 26 (!) 26  Temp: (!) 97.5 F (36.4 C)     SpO2: 96%  100% 100%  Weight:  60.9 kg    Height:  5\' 3"  (1.6 m)     Physical Exam:  General: ill appearing, well developed, well nourished HEENT: Normocephalic, atraumatic, PERRL Cardiovascular: Normal rate and rhythm. Distal pulses intact. Pulmonary: Normal pulmonary effort, rales bilaterally Gastrointestinal: Nondistended abdomen, soft, non-tender, normoactive bowel sounds Musculoskeletal:Normal ROM, trace lower ext edema Skin: Skin is  warm and dry. Neuro: No focal deficits noted, AAOx3. PSYCH: Attentive and cooperative  Data Reviewed:  Results for orders placed or performed during the hospital encounter of 01/20/24 (from the past 24 hours)  Basic metabolic panel     Status: Abnormal   Collection Time: 01/20/24  6:56 PM  Result Value Ref Range   Sodium 135 135 - 145 mmol/L   Potassium 4.7 3.5 - 5.1 mmol/L   Chloride 94 (L) 98 - 111 mmol/L   CO2 28 22 - 32 mmol/L   Glucose, Bld 243 (H) 70 - 99 mg/dL   BUN 59 (H) 8 - 23 mg/dL   Creatinine, Ser 4.40 (H) 0.44 - 1.00 mg/dL   Calcium 9.6 8.9 - 34.7 mg/dL   GFR, Estimated 8 (L) >60 mL/min   Anion gap 13 5 - 15  CBC     Status: Abnormal   Collection Time: 01/20/24  6:56 PM  Result Value Ref Range   WBC 12.1 (H) 4.0 - 10.5 K/uL   RBC 3.96 3.87 - 5.11 MIL/uL   Hemoglobin 11.8 (L) 12.0 - 15.0 g/dL   HCT 42.5 95.6 - 38.7 %   MCV 96.0 80.0 - 100.0 fL   MCH 29.8 26.0 - 34.0 pg   MCHC 31.1 30.0 - 36.0 g/dL   RDW 56.4 33.2 - 95.1 %   Platelets 226 150 - 400 K/uL   nRBC 0.0 0.0 - 0.2 %  Troponin I (High Sensitivity)     Status: Abnormal   Collection Time: 01/20/24  6:56 PM  Result Value Ref Range   Troponin I (High Sensitivity) 130 (HH) <18 ng/L  Brain natriuretic peptide     Status: Abnormal   Collection Time: 01/20/24  6:56 PM  Result Value Ref Range   B Natriuretic Peptide 1,212.2 (H) 0.0 - 100.0 pg/mL     CT Abdomen/ pelvis IMPRESSION: Changes most consistent with UTI with dilatation of the right renal collecting system and ureter with enhancement of the walls. No discrete stone is identified. No parenchymal findings to suggest pyelonephritis are seen.   Small right-sided pleural effusion.   Assessment and Plan: UTI - diagnosed based on CT report.  UA is still  pending.  h/o ESBL UTI.  Merrem started.  Volume overload / SOB - the patient is significantly short of breath even though her O2 sats are 100%.  Will try CPAP to give her some relief until  hemodialysis in the morning. - Try Lasix x 1  3. Type1 DM with outpatient insulin pump - Continue while hospitalized.  4. Elevated troponin - Continue to cycle enzymes.  This suspected to be demand from volume overload but aspirin x 1 ordered.   - Follow closely   Advance Care Planning:   Code Status: Prior the patient names her daughter Byrd Hesselbach as her surrogate decision-maker and she wants to be full code.  Consults: Nephrology  Family Communication: I spoke to the patient's daughter on the phone  Severity of Illness: The appropriate patient status for this patient is INPATIENT. Inpatient status is judged to be reasonable and necessary in order to provide the required intensity of service to ensure the patient's safety. The patient's presenting symptoms, physical exam findings, and initial radiographic and laboratory data in the context of their chronic comorbidities is felt to place them at high risk for further clinical deterioration. Furthermore, it is not anticipated that the patient will be medically stable for discharge from the hospital within 2 midnights of admission.   * I certify that at the point of admission it is my clinical judgment that the patient will require inpatient hospital care spanning beyond 2 midnights from the point of admission due to high intensity of service, high risk for further deterioration and high frequency of surveillance required.*  Author: Buena Irish, MD 01/20/2024 11:09 PM  For on call review [email or web address].

## 2024-01-20 NOTE — ED Notes (Signed)
 Phlebotomy tried to draw pt but could not, no place to stick

## 2024-01-20 NOTE — ED Triage Notes (Signed)
 Shortness of breath  today with abd pain   nausea also    fistula rt arm  she was last dialyzed Friday

## 2024-01-20 NOTE — ED Notes (Signed)
 Attempted to page IV team.

## 2024-01-20 NOTE — ED Notes (Signed)
 This nurse asked phlebotomy to collect patient repeat troponin. Phlebotomist stated that she was unable to collect. Kommor, Wyn Forster, MD updated.

## 2024-01-21 DIAGNOSIS — Z992 Dependence on renal dialysis: Secondary | ICD-10-CM | POA: Diagnosis not present

## 2024-01-21 DIAGNOSIS — E8779 Other fluid overload: Secondary | ICD-10-CM | POA: Diagnosis not present

## 2024-01-21 DIAGNOSIS — N390 Urinary tract infection, site not specified: Secondary | ICD-10-CM | POA: Diagnosis not present

## 2024-01-21 DIAGNOSIS — Z90710 Acquired absence of both cervix and uterus: Secondary | ICD-10-CM | POA: Diagnosis not present

## 2024-01-21 DIAGNOSIS — R109 Unspecified abdominal pain: Secondary | ICD-10-CM | POA: Diagnosis not present

## 2024-01-21 DIAGNOSIS — N12 Tubulo-interstitial nephritis, not specified as acute or chronic: Secondary | ICD-10-CM | POA: Diagnosis not present

## 2024-01-21 DIAGNOSIS — N2581 Secondary hyperparathyroidism of renal origin: Secondary | ICD-10-CM | POA: Diagnosis not present

## 2024-01-21 DIAGNOSIS — I480 Paroxysmal atrial fibrillation: Secondary | ICD-10-CM | POA: Diagnosis not present

## 2024-01-21 DIAGNOSIS — N186 End stage renal disease: Secondary | ICD-10-CM | POA: Diagnosis not present

## 2024-01-21 DIAGNOSIS — I2489 Other forms of acute ischemic heart disease: Secondary | ICD-10-CM | POA: Diagnosis not present

## 2024-01-21 DIAGNOSIS — Z79890 Hormone replacement therapy (postmenopausal): Secondary | ICD-10-CM | POA: Diagnosis not present

## 2024-01-21 DIAGNOSIS — I5033 Acute on chronic diastolic (congestive) heart failure: Secondary | ICD-10-CM | POA: Diagnosis not present

## 2024-01-21 DIAGNOSIS — Z9641 Presence of insulin pump (external) (internal): Secondary | ICD-10-CM | POA: Diagnosis not present

## 2024-01-21 DIAGNOSIS — N10 Acute tubulo-interstitial nephritis: Secondary | ICD-10-CM

## 2024-01-21 DIAGNOSIS — R0602 Shortness of breath: Secondary | ICD-10-CM | POA: Diagnosis not present

## 2024-01-21 DIAGNOSIS — R918 Other nonspecific abnormal finding of lung field: Secondary | ICD-10-CM | POA: Diagnosis not present

## 2024-01-21 DIAGNOSIS — E119 Type 2 diabetes mellitus without complications: Secondary | ICD-10-CM | POA: Diagnosis not present

## 2024-01-21 DIAGNOSIS — I2510 Atherosclerotic heart disease of native coronary artery without angina pectoris: Secondary | ICD-10-CM | POA: Diagnosis not present

## 2024-01-21 DIAGNOSIS — D631 Anemia in chronic kidney disease: Secondary | ICD-10-CM | POA: Diagnosis not present

## 2024-01-21 DIAGNOSIS — Z881 Allergy status to other antibiotic agents status: Secondary | ICD-10-CM | POA: Diagnosis not present

## 2024-01-21 DIAGNOSIS — I132 Hypertensive heart and chronic kidney disease with heart failure and with stage 5 chronic kidney disease, or end stage renal disease: Secondary | ICD-10-CM | POA: Diagnosis not present

## 2024-01-21 DIAGNOSIS — Z794 Long term (current) use of insulin: Secondary | ICD-10-CM | POA: Diagnosis not present

## 2024-01-21 DIAGNOSIS — I509 Heart failure, unspecified: Secondary | ICD-10-CM | POA: Diagnosis not present

## 2024-01-21 DIAGNOSIS — E1022 Type 1 diabetes mellitus with diabetic chronic kidney disease: Secondary | ICD-10-CM | POA: Diagnosis not present

## 2024-01-21 DIAGNOSIS — E8770 Fluid overload, unspecified: Secondary | ICD-10-CM | POA: Diagnosis present

## 2024-01-21 DIAGNOSIS — Z7901 Long term (current) use of anticoagulants: Secondary | ICD-10-CM | POA: Diagnosis not present

## 2024-01-21 DIAGNOSIS — Z8619 Personal history of other infectious and parasitic diseases: Secondary | ICD-10-CM | POA: Diagnosis not present

## 2024-01-21 DIAGNOSIS — E785 Hyperlipidemia, unspecified: Secondary | ICD-10-CM | POA: Diagnosis not present

## 2024-01-21 DIAGNOSIS — Z87440 Personal history of urinary (tract) infections: Secondary | ICD-10-CM | POA: Diagnosis not present

## 2024-01-21 DIAGNOSIS — J9601 Acute respiratory failure with hypoxia: Secondary | ICD-10-CM | POA: Diagnosis not present

## 2024-01-21 DIAGNOSIS — B961 Klebsiella pneumoniae [K. pneumoniae] as the cause of diseases classified elsewhere: Secondary | ICD-10-CM | POA: Diagnosis not present

## 2024-01-21 DIAGNOSIS — I120 Hypertensive chronic kidney disease with stage 5 chronic kidney disease or end stage renal disease: Secondary | ICD-10-CM | POA: Diagnosis not present

## 2024-01-21 DIAGNOSIS — E039 Hypothyroidism, unspecified: Secondary | ICD-10-CM | POA: Diagnosis not present

## 2024-01-21 DIAGNOSIS — I700 Atherosclerosis of aorta: Secondary | ICD-10-CM | POA: Diagnosis not present

## 2024-01-21 DIAGNOSIS — Z8616 Personal history of COVID-19: Secondary | ICD-10-CM | POA: Diagnosis not present

## 2024-01-21 DIAGNOSIS — I110 Hypertensive heart disease with heart failure: Secondary | ICD-10-CM | POA: Diagnosis not present

## 2024-01-21 LAB — RENAL FUNCTION PANEL
Albumin: 3.6 g/dL (ref 3.5–5.0)
Anion gap: 14 (ref 5–15)
BUN: 69 mg/dL — ABNORMAL HIGH (ref 8–23)
CO2: 25 mmol/L (ref 22–32)
Calcium: 9.3 mg/dL (ref 8.9–10.3)
Chloride: 95 mmol/L — ABNORMAL LOW (ref 98–111)
Creatinine, Ser: 5.77 mg/dL — ABNORMAL HIGH (ref 0.44–1.00)
GFR, Estimated: 7 mL/min — ABNORMAL LOW (ref >60)
Glucose, Bld: 271 mg/dL — ABNORMAL HIGH (ref 70–99)
Phosphorus: 5 mg/dL — ABNORMAL HIGH (ref 2.5–4.6)
Potassium: 5.3 mmol/L — ABNORMAL HIGH (ref 3.5–5.1)
Sodium: 134 mmol/L — ABNORMAL LOW (ref 135–145)

## 2024-01-21 LAB — CBC
HCT: 36 % (ref 36.0–46.0)
Hemoglobin: 11.6 g/dL — ABNORMAL LOW (ref 12.0–15.0)
MCH: 30.2 pg (ref 26.0–34.0)
MCHC: 32.2 g/dL (ref 30.0–36.0)
MCV: 93.8 fL (ref 80.0–100.0)
Platelets: 240 10*3/uL (ref 150–400)
RBC: 3.84 MIL/uL — ABNORMAL LOW (ref 3.87–5.11)
RDW: 15.2 % (ref 11.5–15.5)
WBC: 16.1 10*3/uL — ABNORMAL HIGH (ref 4.0–10.5)
nRBC: 0 % (ref 0.0–0.2)

## 2024-01-21 LAB — GLUCOSE, CAPILLARY: Glucose-Capillary: 155 mg/dL — ABNORMAL HIGH (ref 70–99)

## 2024-01-21 LAB — HEPATITIS B SURFACE ANTIGEN: Hepatitis B Surface Ag: NONREACTIVE

## 2024-01-21 LAB — CBG MONITORING, ED: Glucose-Capillary: 250 mg/dL — ABNORMAL HIGH (ref 70–99)

## 2024-01-21 LAB — TROPONIN I (HIGH SENSITIVITY): Troponin I (High Sensitivity): 149 ng/L (ref <18)

## 2024-01-21 MED ORDER — LEVOTHYROXINE SODIUM 88 MCG PO TABS
88.0000 ug | ORAL_TABLET | Freq: Every day | ORAL | Status: DC
  Administered 2024-01-21 – 2024-01-23 (×3): 88 ug via ORAL
  Filled 2024-01-21 (×3): qty 1

## 2024-01-21 MED ORDER — PROCHLORPERAZINE EDISYLATE 10 MG/2ML IJ SOLN: 5.0000 mg | Freq: Four times a day (QID) | INTRAMUSCULAR | Status: DC | PRN

## 2024-01-21 MED ORDER — HEPARIN SODIUM (PORCINE) 1000 UNIT/ML DIALYSIS
1000.0000 [IU] | INTRAMUSCULAR | Status: DC | PRN
  Filled 2024-01-21: qty 1

## 2024-01-21 MED ORDER — ASPIRIN 81 MG PO CHEW
324.0000 mg | CHEWABLE_TABLET | Freq: Once | ORAL | Status: AC
  Administered 2024-01-21: 324 mg via ORAL
  Filled 2024-01-21: qty 4

## 2024-01-21 MED ORDER — SODIUM CHLORIDE 0.9 % IV SOLN
500.0000 mg | INTRAVENOUS | Status: DC
  Administered 2024-01-21 – 2024-01-22 (×2): 500 mg via INTRAVENOUS
  Filled 2024-01-21 (×3): qty 10

## 2024-01-21 MED ORDER — HYDROMORPHONE HCL 1 MG/ML IJ SOLN
0.3000 mg | Freq: Once | INTRAMUSCULAR | Status: AC
  Administered 2024-01-21: 0.3 mg via INTRAVENOUS
  Filled 2024-01-21: qty 1

## 2024-01-21 MED ORDER — ANTICOAGULANT SODIUM CITRATE 4% (200MG/5ML) IV SOLN: 5.0000 mL | Status: DC | PRN

## 2024-01-21 MED ORDER — CLONIDINE HCL 0.1 MG PO TABS: 0.1000 mg | ORAL_TABLET | Freq: Every day | ORAL | Status: DC | PRN

## 2024-01-21 MED ORDER — LIDOCAINE HCL (PF) 1 % IJ SOLN: 5.0000 mL | INTRAMUSCULAR | Status: DC | PRN

## 2024-01-21 MED ORDER — HYDROMORPHONE HCL 1 MG/ML IJ SOLN: 0.3000 mg | Freq: Once | INTRAMUSCULAR | Status: DC

## 2024-01-21 MED ORDER — INSULIN PUMP
Freq: Three times a day (TID) | SUBCUTANEOUS | Status: DC
  Administered 2024-01-22: 2.55 via SUBCUTANEOUS
  Administered 2024-01-22: 0.9 via SUBCUTANEOUS
  Administered 2024-01-22: 1.9 via SUBCUTANEOUS
  Administered 2024-01-22: 0.6 via SUBCUTANEOUS
  Administered 2024-01-23: 1.3 via SUBCUTANEOUS
  Administered 2024-01-23: 2.75 via SUBCUTANEOUS
  Filled 2024-01-21: qty 1

## 2024-01-21 MED ORDER — ALTEPLASE 2 MG IJ SOLR: 2.0000 mg | Freq: Once | INTRAMUSCULAR | Status: DC | PRN

## 2024-01-21 MED ORDER — FUROSEMIDE 10 MG/ML IJ SOLN
40.0000 mg | Freq: Once | INTRAMUSCULAR | Status: AC
  Administered 2024-01-21: 40 mg via INTRAVENOUS
  Filled 2024-01-21: qty 4

## 2024-01-21 MED ORDER — METOPROLOL TARTRATE 12.5 MG HALF TABLET: 12.5000 mg | ORAL_TABLET | Freq: Every day | ORAL | Status: DC | PRN

## 2024-01-21 MED ORDER — PENTAFLUOROPROP-TETRAFLUOROETH EX AERO: 1.0000 | INHALATION_SPRAY | CUTANEOUS | Status: DC | PRN

## 2024-01-21 MED ORDER — HYDROMORPHONE HCL 1 MG/ML IJ SOLN
0.5000 mg | INTRAMUSCULAR | Status: DC | PRN
  Administered 2024-01-21: 0.5 mg via INTRAVENOUS
  Filled 2024-01-21: qty 1

## 2024-01-21 MED ORDER — APIXABAN 2.5 MG PO TABS
2.5000 mg | ORAL_TABLET | Freq: Two times a day (BID) | ORAL | Status: DC
  Administered 2024-01-21 – 2024-01-23 (×6): 2.5 mg via ORAL
  Filled 2024-01-21 (×6): qty 1

## 2024-01-21 MED ORDER — NEPRO/CARBSTEADY PO LIQD: 237.0000 mL | ORAL | Status: DC | PRN

## 2024-01-21 NOTE — Progress Notes (Signed)
 PROGRESS NOTE    RITHIKA SEEL  GEX:[id number] DOB: 24-Apr-1941 DOA: 01/20/2024 PCP: Georgina Quint, MD    Brief Narrative:   Elizabeth Olsen is a 83 y.o. female with past medical history significant for ESRD on HD MWF, chronic diastolic congestive heart failure, CAD s/p PCI, DM2 on insulin pump, dyslipidemia, paroxysmal atrial fibrillation on anticoagulation, history of breast cancer, history of recurrent UTI who presented to [facility] ED on 01/20/2024 with shortness of breath, nausea, abdominal pain.  In the ED, temperature 97.5 F, HR 84, RR 28, BP 210/77, SpO2 96% on room air.  WBC 12.1, hemoglobin 11.8, platelet count 226.  Sodium 135, potassium 4.7, chloride 94, CO2 28, glucose 243, BUN 59, creatinine 5.18.  BNP 1212.2.  High-sensitivity troponin 130>133>149.  Urinalysis with moderate leukocytes, negative nitrite, many bacteria, greater than 50 WBCs.  Chest x-ray with cardiomegaly, mild pulmonary edema with superimposed infection not excluded, aortic atherosclerosis.  Patient was started on meropenem with history of ESBL UTI.  TRH consulted for admission for further evaluation management of urinary tract infection.    Assessment & Plan:    Urinary tract infection History of ESBL E. coli UTI Patient presenting to ED with abdominal pain associate with nausea.  Patient with elevated WBC count of 12.1 and urinalysis consistent with UTI.  History of Klebsiella pneumonia and ESBL E. coli UTI in the past.  CT abdomen/pelvis with contrast with changes consistent with UTI with dilation of the right renal collecting system and ureter with enhancement of the walls, no discrete stone identified, no parenchymal findings suggest pyelonephritis. -- WBC 12.1 -- Order for add-on urine culture placed -- Meropenem 500 mg IV every 24 hours -- Repeat CBC in a.m.  Acute on chronic diastolic congestive heart failure Patient presenting with shortness of breath, last hemodialysis on Friday  01/18/2024.  BNP elevated 1212.  Chest x-ray with cardiomegaly, mild pulmonary edema.  CT abdomen/pelvis notable for pleural effusion. -- Nephrology consulted for continue hemodialysis while inpatient, volume management --1200 mL fluid restriction -- Strict I's and O's, daily weights -- Repeat BMP in the a.m.  Elevated troponin likely secondary to type II demand ischemia Patient with elevated troponin [phone]; likely secondary to type II demand ischemia in the setting of UTI and acute on chronic CHF exacerbation as above with volume overload vs decreased urinary clearance given history of ESRD.  EKG with normal sinus rhythm, no concerning dynamic changes. -- Continue monitor on telemetry  ESRD on HD MWF -- Nephrology following for continued HD while inpatient  Paroxysmal atrial fibrillation On admission, EKG with normal sinus rhythm. -- Metoprolol tartrate 12.5 mg p.o. daily as needed HR >100 and SBP >160 -- Eliquis 2.5 mg p.o. twice daily  Hypothyroidism -- Levothyroxine 88 mcg p.o. daily  Type 1 diabetes mellitus -- Continue insulin pump  CAD s/p PCI Follows with cardiology outpatient.   DVT prophylaxis: apixaban (ELIQUIS) tablet 2.5 mg Start: 01/21/24 1000 apixaban (ELIQUIS) tablet 2.5 mg    Code Status: Prior Family Communication: No family present at bedside this morning  Disposition Plan:  Level of care: Progressive Status is: Inpatient Remains inpatient appropriate because: IV antibiotics, hemodialysis    Consultants:  Nephrology  Procedures:  None  Antimicrobials:  Meropenem 3/23   Subjective: Patient seen and examined at bedside, lying in bed.  Remains in ED holding area.  Reports dyspnea slightly improved after receiving IV Lasix yesterday.  Also abdominal pain improved.  Remains on IV meropenem.  Awaiting for  hemodialysis later today.  No other specific questions, concerns or complaints at this time.  Denies headache, no dizziness, no chest pain, no  palpitations, no fever/chills/night sweats, no nausea cefonicid diarrhea, no focal weakness, no fatigue, no paresthesia.  No acute events overnight per nursing staff.  Objective: Vitals:   01/21/24 1315 01/21/24 1335 01/21/24 1400 01/21/24 1430  BP: (!) 120/92 (!) 143/93 (!) 149/49 (!) 118/59  Pulse: 95 89 85 84  Resp: 20 17 15 16   Temp: 98.6 F (37 C)     SpO2: 100% 100% 100% 100%  Weight:      Height:       No intake or output data in the 24 hours ending 01/21/24 1505 Filed Weights   01/20/24 1847  Weight: 60.9 kg    Examination:  Physical Exam: GEN: NAD, alert and oriented x 3, wd/wn HEENT: NCAT, PERRL, EOMI, sclera clear, MMM PULM: Breath sounds diminished bilateral bases with crackles, normal respiratory effort without accessory muscle use, on room air with SpO2 100% at rest CV: RRR w/ murmur LUSB GI: abd soft, NTND, + BS MSK: no peripheral edema, moves all extremities independently NEURO: No focal neurological deficit PSYCH: normal mood/affect Integumentary: No concerning rashes/lesions/wounds noted on exposed surfaces.    Data Reviewed: I have personally reviewed following labs and imaging studies  CBC: Recent Labs  Lab 01/20/24 1856 01/21/24 1320  WBC 12.1* 16.1*  HGB 11.8* 11.6*  HCT 38.0 36.0  MCV 96.0 93.8  PLT 226 240   Basic Metabolic Panel: Recent Labs  Lab 01/20/24 1856 01/21/24 1320  NA 135 134*  K 4.7 5.3*  CL 94* 95*  CO2 28 25  GLUCOSE 243* 271*  BUN 59* 69*  CREATININE 5.18* 5.77*  CALCIUM 9.6 9.3  PHOS  --  5.0*   GFR: Estimated Creatinine Clearance: 6.2 mL/min (A) (by C-G formula based on SCr of 5.77 mg/dL (H)). Liver Function Tests: Recent Labs  Lab 01/21/24 1320  ALBUMIN 3.6   No results for input(s): "LIPASE", "AMYLASE" in the last 168 hours. No results for input(s): "AMMONIA" in the last 168 hours. Coagulation Profile: No results for input(s): "INR", "PROTIME" in the last 168 hours. Cardiac Enzymes: No results for  input(s): "CKTOTAL", "CKMB", "CKMBINDEX", "TROPONINI" in the last 168 hours. BNP (last 3 results) No results for input(s): "PROBNP" in the last 8760 hours. HbA1C: No results for input(s): "HGBA1C" in the last 72 hours. CBG: Recent Labs  Lab 01/21/24 0323  GLUCAP 250*   Lipid Profile: No results for input(s): "CHOL", "HDL", "LDLCALC", "TRIG", "CHOLHDL", "LDLDIRECT" in the last 72 hours. Thyroid Function Tests: No results for input(s): "TSH", "T4TOTAL", "FREET4", "T3FREE", "THYROIDAB" in the last 72 hours. Anemia Panel: No results for input(s): "VITAMINB12", "FOLATE", "FERRITIN", "TIBC", "IRON", "RETICCTPCT" in the last 72 hours. Sepsis Labs: No results for input(s): "PROCALCITON", "LATICACIDVEN" in the last 168 hours.  No results found for this or any previous visit (from the past 240 hours).       Radiology Studies: CT ABDOMEN PELVIS W CONTRAST Result Date: 01/20/2024 CLINICAL DATA:  Abdominal pain and shortness of breath, initial encounter EXAM: CT ABDOMEN AND PELVIS WITH CONTRAST TECHNIQUE: Multidetector CT imaging of the abdomen and pelvis was performed using the standard protocol following bolus administration of intravenous contrast. RADIATION DOSE REDUCTION: This exam was performed according to the departmental dose-optimization program which includes automated exposure control, adjustment of the mA and/or kV according to patient size and/or use of iterative reconstruction technique. CONTRAST:  75mL OMNIPAQUE  IOHEXOL 350 MG/ML SOLN COMPARISON:  06/11/2023 FINDINGS: Lower chest: Small right-sided pleural effusion is noted. No focal infiltrate or parenchymal nodule is seen. Postsurgical changes in the left breast are noted. Hepatobiliary: No focal liver abnormality is seen. Status post cholecystectomy. No biliary dilatation. Pancreas: Unremarkable. No pancreatic ductal dilatation or surrounding inflammatory changes. Spleen: Normal in size without focal abnormality. Adrenals/Urinary  Tract: Adrenal glands are within normal limits. Kidneys are well visualized within normal enhancement pattern bilaterally. Hydronephrotic changes are noted on the right with enhancement of the walls of the collecting system and ureter consistent with underlying UTI. No definitive changes of pyelonephritis are seen. The bladder is decompressed. Stomach/Bowel: No obstructive or inflammatory changes of the colon are noted. The appendix is not well visualized consistent with a prior surgical history. Small bowel and stomach appear within normal limits. Vascular/Lymphatic: Aortic atherosclerosis. No enlarged abdominal or pelvic lymph nodes. Reproductive: Status post hysterectomy. No adnexal masses. Other: No abdominal wall hernia or abnormality. No abdominopelvic ascites. Musculoskeletal: No acute or significant osseous findings. IMPRESSION: Changes most consistent with UTI with dilatation of the right renal collecting system and ureter with enhancement of the walls. No discrete stone is identified. No parenchymal findings to suggest pyelonephritis are seen. Small right-sided pleural effusion. Electronically Signed   By: Alcide Clever M.D.   On: 01/20/2024 21:40   DG Chest Portable 1 View Result Date: 01/20/2024 CLINICAL DATA:  sob dialysis pt. SOB dialysis pt Per triage notes: "Shortness of breath today with abd pain nausea also fistula rt arm she was last dialyzed Friday" Pt has hx of CHF, diabetes, and htn. EXAM: PORTABLE CHEST 1 VIEW COMPARISON:  Chest x-ray 07/13/2023 FINDINGS: The heart and mediastinal contours are unchanged. Atherosclerotic plaque. Slight patchy airspace opacities. Increased interstitial markings. No pleural effusion. No pneumothorax. No acute osseous abnormality.  Left chest wall vascular clips. IMPRESSION: 1. Cardiomegaly with mild pulmonary edema with superimposed infection not excluded. 2.  Aortic Atherosclerosis (ICD10-I70.0). Electronically Signed   By: Tish Frederickson M.D.   On:  01/20/2024 19:43        Scheduled Meds:  apixaban  2.5 mg Oral BID   Chlorhexidine Gluconate Cloth  6 each Topical Q0600   insulin pump   Subcutaneous TID WC, HS, 0200   levothyroxine  88 mcg Oral QAC breakfast   Continuous Infusions:  meropenem (MERREM) IV       LOS: 0 days    Time spent: 50 minutes spent on 01/21/2024 caring for this patient face-to-face including chart review, ordering labs/tests, documenting, discussion with nursing staff, consultants, updating family and interview/physical exam    Alvira Philips [place], DO [facility] Available via Epic secure chat 7am-7pm After these hours, please refer to coverage provider listed on [email or web address] 01/21/2024, 3:05 PM

## 2024-01-21 NOTE — Progress Notes (Signed)
   01/21/24 1700  Vitals  Temp 97.8 F (36.6 C)  Pulse Rate 80  Resp 16  BP (!) 177/109  SpO2 100 %  O2 Device Nasal Cannula  Weight  (stretcher)  Oxygen Therapy  O2 Flow Rate (L/min) 4 L/min  Post Treatment  Dialyzer Clearance Lightly streaked  Hemodialysis Intake (mL) 100 mL  Liters Processed 72  Fluid Removed (mL) 2500 mL  Tolerated HD Treatment Yes  AVG/AVF Arterial Site Held (minutes) 8 minutes  AVG/AVF Venous Site Held (minutes) 8 minutes   Received patient in bed to unit.  Alert and oriented.  Informed consent signed and in chart.   TX duration:3hrs  Patient tolerated well.  Transported back to the room  Alert, without acute distress.  Hand-off given to patient's nurse.   Access used: RAVF Access issues: none  Total UF removed: 2.5L Medication(s) given: none    [facility]

## 2024-01-21 NOTE — ED Notes (Signed)
 Asked Diplomatic Services operational officer to page on floor coverage.

## 2024-01-21 NOTE — ED Notes (Signed)
 This nurse called RT regarding placing patient on the CPAP. RT stated that he will be there as soon as he can.

## 2024-01-21 NOTE — Consult Note (Signed)
 [facility] Renal Consultation Note    Indication for Consultation:  Management of ESRD/hemodialysis; anemia, hypertension/volume and secondary hyperparathyroidism  WRU:[id number], Elizabeth Kempf, MD  HPI: Elizabeth Olsen is a 83 y.o. female with ESRD on HD MWF at [facility]. She has a past medical history significant for T2DM, constipation, history of PTCA x 3 in 2021, history of breast cancer 1998, and afib (on Eliquis).  Patient presented to the ED over the weekend c/o nausea and vomiting then developed worsening SOB. CT ABD/pelvis showed dilatation of right renal collecting system and ureter concerning for a UTI. She is currently on IV Meropenem. CXR showed mild pulmonary edema. Her last HD was on 3/21 and it appears overall she's been meeting her EDW. Labs include: Na 134. K+ 5.3, SrCr 5.77, Ca 9.3, Phos 5, Albumin 3.6, Hgb 11.6 and Trop 149. Seen and examined patient on HD today. She is resting comfortably. BP is 143/93. She denies CP, palpitations, and N/V. UFG set 3L.   Past Medical History:  Diagnosis Date   A-fib (HCC)    Acquired hypothyroidism 10/13/2017   Acute diastolic CHF (congestive heart failure) (HCC) 11/20/2019   Acute respiratory failure with hypoxia (HCC) 10/13/2017   Acute systolic heart failure (HCC) 10/13/2017   Anemia    Anemia associated with diabetes mellitus (HCC) 10/13/2017   Anemia of chronic disease 12/10/2017   Blood transfusion    Blood transfusion without reported diagnosis    with breast surgery   Breast cancer (HCC) 15 years ago   left    Cardiomyopathy (HCC) 07/23/2017   Ejection fraction 45-50% may; from September 2018   CHF (congestive heart failure) (HCC)    Chronic cholecystitis with calculus 01/22/2018   Colon polyp 03/2007   adenomatous   COVID-19 virus infection 11/20/2019   Diabetes mellitus    dx 1998.  was told prior to getting chemo that her bld sugar rose.  She thought it would go back   Dyspnea    d/t  anemia   ESRD (end stage renal disease) on dialysis (HCC)    HD MWF   Essential hypertension 09/11/2018   GERD (gastroesophageal reflux disease)    Heart murmur    as child   Hernia, incisional    Hyperlipidemia    Hypertension    Hypothyroidism    Iron deficiency 07/23/2017   Near syncope 04/01/2018   Neuropathy    Osteoarthritis, hip, bilateral    PONV (postoperative nausea and vomiting)    Refractory anemia, unspecified (HCC) 12/10/2017   Short-term memory loss    per pt's daughter   Type 1 diabetes (HCC) 07/23/2017   Vertigo 06/24/2018   Vitamin D deficiency    Past Surgical History:  Procedure Laterality Date   A/V FISTULAGRAM N/A 05/16/2023   Procedure: A/V Fistulagram;  Surgeon: Victorino Sparrow, MD;  [facility]: [facility] INVASIVE CV LAB;  Service: Cardiovascular;  Laterality: N/A;   ABDOMINAL HYSTERECTOMY     APPENDECTOMY     BREAST SURGERY     CAPD REMOVAL N/A 05/31/2023   Procedure: REMOVAL CONTINUOUS AMBULATORY PERITONEAL DIALYSIS  (CAPD) CATHETER;  Surgeon: [name], MD;  Location[facility] OR;  Service: Vascular;  Laterality: N/A;   CHOLECYSTECTOMY  01/22/2018   LAPROSCOPIC    CHOLECYSTECTOMY N/A 01/22/2018   Procedure: LAPAROSCOPIC CHOLECYSTECTOMY WITH INTRAOPERATIVE CHOLANGIOGRAM;  Surgeon: Manus Rudd, MD;  Location: [facility] OR;  Service: General;  Laterality: N/A;   COLONOSCOPY  2013   due next 03-2017   EYE  SURGERY     bil cataracts   IR THROMBECTOMY AV FISTULA W/THROMBOLYSIS/PTA INC/SHUNT/IMG RIGHT Right 09/11/2023   IR US GUIDE VASC ACCESS RIGHT  09/11/2023   IUD REMOVAL     with appendectomy   MASTECTOMY, MODIFIED RADICAL W/RECONSTRUCTION Left 1998   10 nodes out   PERIPHERAL VASCULAR INTERVENTION  05/16/2023   Procedure: PERIPHERAL VASCULAR INTERVENTION;  Surgeon: Victorino Sparrow, MD;  Location: [facility] INVASIVE CV LAB;  Service: Cardiovascular;;   THORACENTESIS Right 05/17/2023   Procedure: THORACENTESIS;  Surgeon: Lorin Glass, MD;  Location: [facility] ENDOSCOPY;   Service: Pulmonary;  Laterality: Right;   TUMOR EXCISION Left    x 2, neck, head   Family History  Problem Relation Age of Onset   Diabetes Other        both sides of family   Hypertension Father    Congestive Heart Failure Father    Peripheral vascular disease Father    Hypertension Maternal Grandfather    Hypertension Maternal Grandmother    Stomach cancer Maternal Grandmother        GGM   Heart attack Brother    Colon cancer Neg Hx    Esophageal cancer Neg Hx    Pancreatic cancer Neg Hx    Rectal cancer Neg Hx    Social History:  reports that she has never smoked. She has never used smokeless tobacco. She reports that she does not drink alcohol and does not use drugs. Allergies  Allergen Reactions   Erythromycin Shortness Of Breath and Swelling    "throat swelling", sob, thrashing ,    Penicillins Rash   Tape Other (See Comments)    Patient states that she prefers paper tape because other tapes tear skin.   Dextromethorphan Rash   Doxylamine-Dm Rash   Nyquil [Pseudoeph-Doxylamine-Dm-Apap] Rash   Sulfa Antibiotics Rash   Prior to Admission medications   Medication Sig Start Date End Date Taking? Authorizing Provider  acetaminophen (TYLENOL) 325 MG tablet Take by mouth every 6 (six) hours as needed for headache, fever or mild pain (pain score 1-3).   Yes [provider]  apixaban (ELIQUIS) 2.5 MG TABS tablet Take 1 tablet (2.5 mg total) by mouth 2 (two) times daily. 12/27/23  Yes Georgeanna Lea, MD  ascorbic Acid (VITAMIN C) 500 MG CPCR Take 500 mg by mouth every evening.   Yes [provider]  cloNIDine (CATAPRES) 0.1 MG tablet Take 1 tablet (0.1 mg total) by mouth daily as needed (FOR SYSTOLIC BLOOD PRESSURE GREATER THAN 170 AND OR DIASTOLIC FOR BLOOD PRESSURE GREATER 100). 05/18/23  Yes Bender, Irving Burton, DO  Glucagon (BAQSIMI ONE PACK) 3 MG/DOSE POWD As needed for severe hypoglycemia 01/10/24  Yes [provider]  Insulin Disposable Pump  (OMNIPOD 5 [id number] PODS GEN 5) MISC Inject into the skin. 10/28/23  Yes [provider]  insulin glargine (LANTUS SOLOSTAR) 100 UNIT/ML Solostar Pen Inject into the skin as needed. 01/10/24  Yes [provider]  insulin lispro (HUMALOG) 100 UNIT/ML injection USE AS DIRECTED VIA INSULIN PUMP. MAX DAILY DOSAGE IS 50 UNITS 01/10/24  Yes [provider]  levothyroxine (SYNTHROID) 88 MCG tablet Take 1 tablet (88 mcg total) by mouth daily before breakfast. 12/19/23 12/18/24 Yes Sagardia, Elizabeth Kempf, MD  lidocaine-prilocaine (EMLA) cream Apply topically once. 10/16/23  Yes [provider]  Menthol-Zinc Oxide (BODY POWDER MEDICATED EX) Apply 1 application  topically daily. Apply under breast and stomach   Yes [provider]  metoprolol tartrate (LOPRESSOR) 25  MG tablet Take 12.5 mg by mouth daily as needed (IF HR more than 100 and syst BP more than 160 per daughter).   Yes [provider]  VELPHORO 500 MG chewable tablet Chew 500 mg by mouth 5 (five) times daily as needed. 09/11/23  Yes [provider]  White Petrolatum-Mineral Oil (LUBRICANT EYE NIGHTTIME) OINT Apply 1 Application to eye at bedtime.   Yes [provider]  zinc gluconate 50 MG tablet Take 50 mg by mouth every evening.   Yes [provider]  ondansetron (ZOFRAN) 4 MG tablet Take 1 tablet (4 mg total) by mouth every 8 (eight) hours as needed for nausea or vomiting. Patient not taking: Reported on 01/21/2024 06/14/23   Masters, Florentina Addison, DO   Current Facility-Administered Medications  Medication Dose Route Frequency Provider Last Rate Last Admin   apixaban (ELIQUIS) tablet 2.5 mg  2.5 mg Oral BID Buena Irish, MD   2.5 mg at 01/21/24 1113   Chlorhexidine Gluconate Cloth 2 % PADS 6 each  6 each Topical Q0600 Delano Metz, MD       cloNIDine (CATAPRES) tablet 0.1 mg  0.1 mg Oral Daily PRN Buena Irish, MD       HYDROmorphone (DILAUDID) injection 0.5 mg  0.5  mg Intravenous Q4H PRN Buena Irish, MD   0.5 mg at 01/21/24 0308   insulin pump   Subcutaneous TID WC, HS, 0200 Buena Irish, MD   Given at 01/21/24 0800   levothyroxine (SYNTHROID) tablet 88 mcg  88 mcg Oral QAC breakfast Buena Irish, MD   88 mcg at 01/21/24 1610   meropenem (MERREM) 500 mg in sodium chloride 0.9 % 100 mL IVPB  500 mg Intravenous Q24H Buena Irish, MD       prochlorperazine (COMPAZINE) injection 5 mg  5 mg Intravenous Q6H PRN Buena Irish, MD       Labs: Basic Metabolic Panel: Recent Labs  Lab 01/20/24 1856 01/21/24 1320  NA 135 134*  K 4.7 5.3*  CL 94* 95*  CO2 28 25  GLUCOSE 243* 271*  BUN 59* 69*  CREATININE 5.18* 5.77*  CALCIUM 9.6 9.3  PHOS  --  5.0*   Liver Function Tests: Recent Labs  Lab 01/21/24 1320  ALBUMIN 3.6   No results for input(s): "LIPASE", "AMYLASE" in the last 168 hours. No results for input(s): "AMMONIA" in the last 168 hours. CBC: Recent Labs  Lab 01/20/24 1856 01/21/24 1320  WBC 12.1* 16.1*  HGB 11.8* 11.6*  HCT 38.0 36.0  MCV 96.0 93.8  PLT 226 240   Cardiac Enzymes: No results for input(s): "CKTOTAL", "CKMB", "CKMBINDEX", "TROPONINI" in the last 168 hours. CBG: Recent Labs  Lab 01/21/24 0323  GLUCAP 250*   Iron Studies: No results for input(s): "IRON", "TIBC", "TRANSFERRIN", "FERRITIN" in the last 72 hours. Studies/Results: CT ABDOMEN PELVIS W CONTRAST Result Date: 01/20/2024 CLINICAL DATA:  Abdominal pain and shortness of breath, initial encounter EXAM: CT ABDOMEN AND PELVIS WITH CONTRAST TECHNIQUE: Multidetector CT imaging of the abdomen and pelvis was performed using the standard protocol following bolus administration of intravenous contrast. RADIATION DOSE REDUCTION: This exam was performed according to the departmental dose-optimization program which includes automated exposure control, adjustment of the mA and/or kV according to patient size and/or use of iterative reconstruction  technique. CONTRAST:  75mL OMNIPAQUE IOHEXOL 350 MG/ML SOLN COMPARISON:  06/11/2023 FINDINGS: Lower chest: Small right-sided pleural effusion is noted. No focal infiltrate or parenchymal nodule is seen. Postsurgical changes in the left breast are  noted. Hepatobiliary: No focal liver abnormality is seen. Status post cholecystectomy. No biliary dilatation. Pancreas: Unremarkable. No pancreatic ductal dilatation or surrounding inflammatory changes. Spleen: Normal in size without focal abnormality. Adrenals/Urinary Tract: Adrenal glands are within normal limits. Kidneys are well visualized within normal enhancement pattern bilaterally. Hydronephrotic changes are noted on the right with enhancement of the walls of the collecting system and ureter consistent with underlying UTI. No definitive changes of pyelonephritis are seen. The bladder is decompressed. Stomach/Bowel: No obstructive or inflammatory changes of the colon are noted. The appendix is not well visualized consistent with a prior surgical history. Small bowel and stomach appear within normal limits. Vascular/Lymphatic: Aortic atherosclerosis. No enlarged abdominal or pelvic lymph nodes. Reproductive: Status post hysterectomy. No adnexal masses. Other: No abdominal wall hernia or abnormality. No abdominopelvic ascites. Musculoskeletal: No acute or significant osseous findings. IMPRESSION: Changes most consistent with UTI with dilatation of the right renal collecting system and ureter with enhancement of the walls. No discrete stone is identified. No parenchymal findings to suggest pyelonephritis are seen. Small right-sided pleural effusion. Electronically Signed   By: Alcide Clever M.D.   On: 01/20/2024 21:40   DG Chest Portable 1 View Result Date: 01/20/2024 CLINICAL DATA:  sob dialysis pt. SOB dialysis pt Per triage notes: "Shortness of breath today with abd pain nausea also fistula rt arm she was last dialyzed Friday" Pt has hx of CHF, diabetes, and htn.  EXAM: PORTABLE CHEST 1 VIEW COMPARISON:  Chest x-ray 07/13/2023 FINDINGS: The heart and mediastinal contours are unchanged. Atherosclerotic plaque. Slight patchy airspace opacities. Increased interstitial markings. No pleural effusion. No pneumothorax. No acute osseous abnormality.  Left chest wall vascular clips. IMPRESSION: 1. Cardiomegaly with mild pulmonary edema with superimposed infection not excluded. 2.  Aortic Atherosclerosis (ICD10-I70.0). Electronically Signed   By: Tish Frederickson M.D.   On: 01/20/2024 19:43    ROS: All others negative except those listed in HPI.   Physical Exam: Vitals:   01/21/24 0615 01/21/24 1315 01/21/24 1335 01/21/24 1400  BP: (!) 133/44 (!) 120/92 (!) 143/93 (!) 149/49  Pulse: 80 95 89 85  Resp: (!) 25 20 17 15   Temp:  98.6 F (37 C)    SpO2: 100% 100% 100% 100%  Weight:      Height:         General: Elderly woman, on RA, NAD Head: Sclera not icteric  Lungs: Clear anteriorly. No wheeze, rales or rhonchi. Breathing is unlabored. Heart: RRR. No murmur, rubs or gallops.  Abdomen: soft and non-tender Lower extremities: Trace bilateral lower extremitites Neuro: AAOx3. Moves all extremities spontaneously. Dialysis Access: R AVF (+) B/T  Dialysis Orders:  MWF - [facility] 4hrs, BFR 400, DFR 800,  EDW 58.4kg, 2K/ 2Ca Heparin None  Calcitriol PO qHD - 01/18/24 Home meds:  Assessment/Plan: Volume overload/SOB - Noted patient was on CPAP overnight and Lasix X1 was given. On HD and now on RA.  Urinary Tract Infection - CT Abd/pelvis showed dilatation of right renal collection system and ureter. 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 On HD  Hypertension/volume  - Blood pressures stable and severely overloaded.  Anemia of CKD - Hgb 11.6, no Fe/ESA is indicated at this time Secondary Hyperparathyroidism -  Ca and Phos at goal Nutrition - Renal diet with fluid restrictions  Salome Holmes, NP [facility] 01/21/2024, 2:26  PM

## 2024-01-21 NOTE — Progress Notes (Signed)
 RT attempted to place pt. On CPAP x2 but pt was not able to tolerate the mask and pressure. RN notified and pt placed back on  2L. VS are stable at this time.

## 2024-01-21 NOTE — Procedures (Signed)
 I was present at this dialysis session. I have reviewed the session itself and made appropriate changes.   Vital signs in last 24 hours:  Temp:  [97.5 F (36.4 C)-98.6 F (37 C)] 98.6 F (37 C) (03/24 1315) Pulse Rate:  [78-101] 85 (03/24 1400) Resp:  [13-28] 15 (03/24 1400) BP: (102-210)/(42-97) 149/49 (03/24 1400) SpO2:  [93 %-100 %] 100 % (03/24 1400) Weight:  [60.9 kg] 60.9 kg (03/23 1847) Weight change:  Filed Weights   01/20/24 1847  Weight: 60.9 kg    Recent Labs  Lab 01/20/24 1856  NA 135  K 4.7  CL 94*  CO2 28  GLUCOSE 243*  BUN 59*  CREATININE 5.18*  CALCIUM 9.6    Recent Labs  Lab 01/20/24 1856 01/21/24 1320  WBC 12.1* 16.1*  HGB 11.8* 11.6*  HCT 38.0 36.0  MCV 96.0 93.8  PLT 226 240    Scheduled Meds:  apixaban  2.5 mg Oral BID   Chlorhexidine Gluconate Cloth  6 each Topical Q0600   insulin pump   Subcutaneous TID WC, HS, 0200   levothyroxine  88 mcg Oral QAC breakfast   Continuous Infusions:  meropenem (MERREM) IV     PRN Meds:.cloNIDine, HYDROmorphone (DILAUDID) injection, prochlorperazine   Irena Cords,  MD 01/21/2024, 2:11 PM

## 2024-01-21 NOTE — ED Notes (Signed)
 This nurse spoke with the attending floor coverage. This nurse relayed that the patient's blood pressure has been decreasing and that she is currently more relaxed and sleeping. Floor coverage stated to continue to monitor the patient.

## 2024-01-22 DIAGNOSIS — N390 Urinary tract infection, site not specified: Secondary | ICD-10-CM | POA: Insufficient documentation

## 2024-01-22 DIAGNOSIS — N10 Acute tubulo-interstitial nephritis: Secondary | ICD-10-CM | POA: Diagnosis not present

## 2024-01-22 HISTORY — DX: Urinary tract infection, site not specified: N39.0

## 2024-01-22 LAB — CBC
HCT: 34.1 % — ABNORMAL LOW (ref 36.0–46.0)
Hemoglobin: 10.9 g/dL — ABNORMAL LOW (ref 12.0–15.0)
MCH: 30.2 pg (ref 26.0–34.0)
MCHC: 32 g/dL (ref 30.0–36.0)
MCV: 94.5 fL (ref 80.0–100.0)
Platelets: 239 10*3/uL (ref 150–400)
RBC: 3.61 MIL/uL — ABNORMAL LOW (ref 3.87–5.11)
RDW: 15.2 % (ref 11.5–15.5)
WBC: 11.1 10*3/uL — ABNORMAL HIGH (ref 4.0–10.5)
nRBC: 0 % (ref 0.0–0.2)

## 2024-01-22 LAB — RENAL FUNCTION PANEL
Albumin: 3.3 g/dL — ABNORMAL LOW (ref 3.5–5.0)
Anion gap: 12 (ref 5–15)
BUN: 37 mg/dL — ABNORMAL HIGH (ref 8–23)
CO2: 29 mmol/L (ref 22–32)
Calcium: 9 mg/dL (ref 8.9–10.3)
Chloride: 94 mmol/L — ABNORMAL LOW (ref 98–111)
Creatinine, Ser: 4.23 mg/dL — ABNORMAL HIGH (ref 0.44–1.00)
GFR, Estimated: 10 mL/min — ABNORMAL LOW (ref >60)
Glucose, Bld: 125 mg/dL — ABNORMAL HIGH (ref 70–99)
Phosphorus: 4.3 mg/dL (ref 2.5–4.6)
Potassium: 4.8 mmol/L (ref 3.5–5.1)
Sodium: 135 mmol/L (ref 135–145)

## 2024-01-22 LAB — GLUCOSE, CAPILLARY
Glucose-Capillary: 126 mg/dL — ABNORMAL HIGH (ref 70–99)
Glucose-Capillary: 154 mg/dL — ABNORMAL HIGH (ref 70–99)
Glucose-Capillary: 162 mg/dL — ABNORMAL HIGH (ref 70–99)
Glucose-Capillary: 180 mg/dL — ABNORMAL HIGH (ref 70–99)
Glucose-Capillary: 177 mg/dL — ABNORMAL HIGH (ref 70–99)

## 2024-01-22 LAB — HEPATITIS B SURFACE ANTIGEN: Hepatitis B Surface Ag: NONREACTIVE

## 2024-01-22 MED ORDER — LACTATED RINGERS IV BOLUS
250.0000 mL | Freq: Once | INTRAVENOUS | Status: AC
  Administered 2024-01-22: 250 mL via INTRAVENOUS

## 2024-01-22 NOTE — Inpatient Diabetes Management (Addendum)
 Inpatient Diabetes Program Recommendations  AACE/ADA: New Consensus Statement on Inpatient Glycemic Control   Target Ranges:  Prepandial:   less than 140 mg/dL      Peak postprandial:   less than 180 mg/dL (1-2 hours)      Critically ill patients:  140 - 180 mg/dL    Latest Reference Range & Units 01/21/24 03:23 01/21/24 22:50 01/22/24 02:46 01/22/24 06:33  Glucose-Capillary 70 - 99 mg/dL 161 (H) 096 (H) 045 (H) 154 (H)    Latest Reference Range & Units 01/20/24 18:56 01/21/24 13:20 01/22/24 02:32  Glucose 70 - 99 mg/dL 409 (H) 811 (H) 914 (H)   Review of Glycemic Control  Diabetes history: DM1 (makes NO insulin; requires basal, correction, and carb coverage insulin) Outpatient Diabetes medications: OmniPod insulin pump with Dexcom G7; when not on insulin pump Lantus 12 units daily, Humalog 1:15 grams Current orders for Inpatient glycemic control: Insulin Pump AC&HS and 2am  NOTE: Patient has Type 1 DM and uses an OmniPod Insulin Pump for DM control.  Patient sees [facility] Endocrinologist and was seen by Benita Stabile, FNP on 01/10/24. Per office note on 01/10/24 patient's insulin pump settings were adjusted to Basal 12A 0.4 unit/hr and I:SF 1:55 mg/dl. Also noted that patient has dementia and patient's daughter, son or caregiver administer boluses on insulin pump for patient. Will plan to see patient to verify insulin pump settings and be sure family is with patient to administer boluses when needed.   Addendum 01/22/24@11 :15-Spoke with patient at bedside regarding insulin pump. Inquired about insulin pump and patient notes she has OmniPod insulin pump on left arm with PDM device at bedside table. Patient states her family does everything with her insulin pump so she is not able to provide any information. Patient's PDM shows current CBG 173 mg/dl and PDM information in Spanish so not able to verify insulin pump settings. Patient uses auto mode. Patient states that her caregiver  does bolus on pump.  Spoke with  RN and she reports that patient had caregiver with her this morning who put in carbs and glucose to do a correction. RN reports caregiver reported they would be back before lunch to do bolus on insulin pump. Called patient's daughter Zebulan Hinshaw Hesselbach) and she notes that patient's caregiver give patient bolus on the insulin pump by counting carbs and using CGM glucose for correction boluses. Imogen Maddalena Hesselbach, notes that patient stays in automode and caregiver is actually gone home now to get more pump supplies to have at bedside in case needed.    Thanks, Orlando Penner, RN, MSN, CDCES Diabetes Coordinator Inpatient Diabetes Program [phone] (Team Pager from 8am to 5pm)

## 2024-01-22 NOTE — Progress Notes (Signed)
 Pt receives out-pt HD at [facility] on MWF. Will assist as needed.   Olivia Canter Renal Navigator [phone]

## 2024-01-22 NOTE — Progress Notes (Signed)
 [facility] night cross cover note:   I was notified by RN that, following hemodialysis session during late afternoon, that the patient's blood pressure is now lower, most recently 97/41. Patient denies any new or worsening symptoms at this time, and other VS appear stable , including afebrile, heart rates in the 80s, respiratory rate 18, and oxygen saturation 100% on 4 L nasal cannula, which is unchanged from her supplemental oxygen requirements throughout the day. I confirmed with patient's RN that the patient is mentating appropriately. It appears that she has persistently wide pulse pressures.  It appears that today's hemodialysis session was associated with ultrafiltration removal of 2.5 L. Considered potential relative hypotension from associated fluid shift. Will be conservative with ivf' noting that her hospitalization is also associated with acute on chronic diastolic heart failure. I ordered LR 250 cc's to be administered over 1 hour.   Ensuing SBP has improved to the 110's mmHg without any corresponding worsening of respiratory status, with RR still 18 and no interval increase in supplemental O2 requirements.      Newton Pigg, DO Hospitalist

## 2024-01-22 NOTE — Plan of Care (Signed)
  Problem: Health Behavior/Discharge Planning: Goal: Ability to manage health-related needs will improve Outcome: Not Progressing   Problem: Activity: Goal: Risk for activity intolerance will decrease Outcome: Progressing   Problem: Safety: Goal: Ability to remain free from injury will improve Outcome: Progressing

## 2024-01-22 NOTE — TOC CM/SW Note (Addendum)
 Transition of Care [facility]) - Inpatient Brief Assessment   Patient Details  Name: Elizabeth Olsen MRN: [id number] Date of Birth: [birth year]  Transition of Care [facility]) CM/SW Contact:    Leone Haven, RN Phone Number: 01/22/2024, 2:25 PM   Clinical Narrative: From home with caregiver, has PCP and insurance on file, states has no HH services in place at this time , has a walker but does not use it  at home.  States care giver  will transport them home at dc and family is support system, states gets medications from [facility] on [address].  Pta self ambulatory. She gives this NCM permission to speak with caregiver, and her daughter.    Transition of Care Asessment:

## 2024-01-22 NOTE — Progress Notes (Signed)
 Patient ID: Elizabeth Olsen, female   DOB: 06-21-1941, 83 y.o.   MRN: [id number] S: Feels much better O:BP 132/63 (BP Location: Left Leg)   Pulse 79   Temp 97.9 F (36.6 C) (Oral)   Resp 20   Ht 5\' 4"  (1.626 m)   Wt 56.9 kg   SpO2 100%   BMI 21.53 kg/m   Intake/Output Summary (Last 24 hours) at 01/22/2024 1011 Last data filed at 01/21/2024 1700 Gross per 24 hour  Intake --  Output 2500 ml  Net -2500 ml   Intake/Output: I/O last 3 completed shifts: In: -  Out: 2500 [Other:2500]  Intake/Output this shift:  No intake/output data recorded. Weight change: -4 kg Gen: NAD CVS: RRR Resp:CTA Abd: +BS, soft, NT?ND Ext: no edema  Recent Labs  Lab 01/20/24 1856 01/21/24 1320 01/22/24 0232  NA 135 134* 135  K 4.7 5.3* 4.8  CL 94* 95* 94*  CO2 28 25 29   GLUCOSE 243* 271* 125*  BUN 59* 69* 37*  CREATININE 5.18* 5.77* 4.23*  ALBUMIN  --  3.6 3.3*  CALCIUM 9.6 9.3 9.0  PHOS  --  5.0* 4.3   Liver Function Tests: Recent Labs  Lab 01/21/24 1320 01/22/24 0232  ALBUMIN 3.6 3.3*   No results for input(s): "LIPASE", "AMYLASE" in the last 168 hours. No results for input(s): "AMMONIA" in the last 168 hours. CBC: Recent Labs  Lab 01/20/24 1856 01/21/24 1320 01/22/24 0232  WBC 12.1* 16.1* 11.1*  HGB 11.8* 11.6* 10.9*  HCT 38.0 36.0 34.1*  MCV 96.0 93.8 94.5  PLT 226 240 239   Cardiac Enzymes: No results for input(s): "CKTOTAL", "CKMB", "CKMBINDEX", "TROPONINI" in the last 168 hours. CBG: Recent Labs  Lab 01/21/24 0323 01/21/24 2250 01/22/24 0246 01/22/24 0633  GLUCAP 250* 155* 126* 154*    Iron Studies: No results for input(s): "IRON", "TIBC", "TRANSFERRIN", "FERRITIN" in the last 72 hours. Studies/Results: CT ABDOMEN PELVIS W CONTRAST Result Date: 01/20/2024 CLINICAL DATA:  Abdominal pain and shortness of breath, initial encounter EXAM: CT ABDOMEN AND PELVIS WITH CONTRAST TECHNIQUE: Multidetector CT imaging of the abdomen and pelvis was performed using the standard  protocol following bolus administration of intravenous contrast. RADIATION DOSE REDUCTION: This exam was performed according to the departmental dose-optimization program which includes automated exposure control, adjustment of the mA and/or kV according to patient size and/or use of iterative reconstruction technique. CONTRAST:  75mL OMNIPAQUE IOHEXOL 350 MG/ML SOLN COMPARISON:  06/11/2023 FINDINGS: Lower chest: Small right-sided pleural effusion is noted. No focal infiltrate or parenchymal nodule is seen. Postsurgical changes in the left breast are noted. Hepatobiliary: No focal liver abnormality is seen. Status post cholecystectomy. No biliary dilatation. Pancreas: Unremarkable. No pancreatic ductal dilatation or surrounding inflammatory changes. Spleen: Normal in size without focal abnormality. Adrenals/Urinary Tract: Adrenal glands are within normal limits. Kidneys are well visualized within normal enhancement pattern bilaterally. Hydronephrotic changes are noted on the right with enhancement of the walls of the collecting system and ureter consistent with underlying UTI. No definitive changes of pyelonephritis are seen. The bladder is decompressed. Stomach/Bowel: No obstructive or inflammatory changes of the colon are noted. The appendix is not well visualized consistent with a prior surgical history. Small bowel and stomach appear within normal limits. Vascular/Lymphatic: Aortic atherosclerosis. No enlarged abdominal or pelvic lymph nodes. Reproductive: Status post hysterectomy. No adnexal masses. Other: No abdominal wall hernia or abnormality. No abdominopelvic ascites. Musculoskeletal: No acute or significant osseous findings. IMPRESSION: Changes most consistent with UTI with dilatation  of the right renal collecting system and ureter with enhancement of the walls. No discrete stone is identified. No parenchymal findings to suggest pyelonephritis are seen. Small right-sided pleural effusion. Electronically  Signed   By: Alcide Clever M.D.   On: 01/20/2024 21:40   DG Chest Portable 1 View Result Date: 01/20/2024 CLINICAL DATA:  sob dialysis pt. SOB dialysis pt Per triage notes: "Shortness of breath today with abd pain nausea also fistula rt arm she was last dialyzed Friday" Pt has hx of CHF, diabetes, and htn. EXAM: PORTABLE CHEST 1 VIEW COMPARISON:  Chest x-ray 07/13/2023 FINDINGS: The heart and mediastinal contours are unchanged. Atherosclerotic plaque. Slight patchy airspace opacities. Increased interstitial markings. No pleural effusion. No pneumothorax. No acute osseous abnormality.  Left chest wall vascular clips. IMPRESSION: 1. Cardiomegaly with mild pulmonary edema with superimposed infection not excluded. 2.  Aortic Atherosclerosis (ICD10-I70.0). Electronically Signed   By: Tish Frederickson M.D.   On: 01/20/2024 19:43    apixaban  2.5 mg Oral BID   Chlorhexidine Gluconate Cloth  6 each Topical Q0600   insulin pump   Subcutaneous TID WC, HS, 0200   levothyroxine  88 mcg Oral QAC breakfast    BMET    Component Value Date/Time   NA 135 01/22/2024 0232   NA 130 (L) 05/21/2020 0952   K 4.8 01/22/2024 0232   CL 94 (L) 01/22/2024 0232   CO2 29 01/22/2024 0232   GLUCOSE 125 (H) 01/22/2024 0232   BUN 37 (H) 01/22/2024 0232   BUN 38 (H) 05/21/2020 0952   CREATININE 4.23 (H) 01/22/2024 0232   CREATININE 2.00 (H) 02/10/2020 0925   CALCIUM 9.0 01/22/2024 0232   GFRNONAA 10 (L) 01/22/2024 0232   GFRNONAA 23 (L) 02/10/2020 0925   GFRAA 22 (L) 05/21/2020 0952   GFRAA 27 (L) 02/10/2020 0925   CBC    Component Value Date/Time   WBC 11.1 (H) 01/22/2024 0232   RBC 3.61 (L) 01/22/2024 0232   HGB 10.9 (L) 01/22/2024 0232   HGB 10.8 (L) 07/07/2020 0938   HGB 10.9 (L) 03/17/2020 1134   HGB 12.3 02/14/2007 0909   HCT 34.1 (L) 01/22/2024 0232   HCT 34.3 03/17/2020 1134   HCT 35.6 02/14/2007 0909   PLT 239 01/22/2024 0232   PLT 266 07/07/2020 0938   PLT 505 (H) 03/17/2020 1134   MCV 94.5  01/22/2024 0232   MCV 89 03/17/2020 1134   MCV 87.5 02/14/2007 0909   MCH 30.2 01/22/2024 0232   MCHC 32.0 01/22/2024 0232   RDW 15.2 01/22/2024 0232   RDW 15.0 03/17/2020 1134   RDW 13.1 02/14/2007 0909   LYMPHSABS 0.8 07/13/2023 1609   LYMPHSABS 2.4 02/14/2007 0909   MONOABS 0.9 07/13/2023 1609   MONOABS 0.7 02/14/2007 0909   EOSABS 0.1 07/13/2023 1609   EOSABS 0.2 02/14/2007 0909   BASOSABS 0.0 07/13/2023 1609   BASOSABS 0.0 02/14/2007 0909    Dialysis Orders:  MWF - [facility] 4hrs, BFR 400, DFR 800,  EDW 58.4kg, 2K/ 2Ca Heparin None  Calcitriol PO qHD - 01/18/24 Home meds:   Assessment/Plan: Volume overload/SOB - Noted patient was on CPAP overnight and Lasix X1 was given. Resolved after HD and is now on room air. Urinary Tract Infection - CT Abd/pelvis showed dilatation of right renal collection system and ureter. 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 On HD  Hypertension/volume  - Blood pressures stable and severely overloaded.  Anemia of CKD - Hgb 11.6,  no Fe/ESA is indicated at this time Secondary Hyperparathyroidism -  Ca and Phos at goal Nutrition - Renal diet with fluid restrictions Disposition - pt is stable for discharge from renal standpoint.  Would prefer her to f/u with her outpatient unit given high HD census.  Irena Cords, MD [employer] [phone]

## 2024-01-22 NOTE — Progress Notes (Signed)
 PROGRESS NOTE    Elizabeth Olsen  YQM:[id number] DOB: July 04, 1941 DOA: 01/20/2024 PCP: Georgina Quint, MD    Brief Narrative:   Elizabeth Olsen is a 83 y.o. female with past medical history significant for ESRD on HD MWF, chronic diastolic congestive heart failure, CAD s/p PCI, DM2 on insulin pump, dyslipidemia, paroxysmal atrial fibrillation on anticoagulation, history of breast cancer, history of recurrent UTI who presented to [facility] ED on 01/20/2024 with shortness of breath, nausea, abdominal pain.  In the ED, temperature 97.5 F, HR 84, RR 28, BP 210/77, SpO2 96% on room air.  WBC 12.1, hemoglobin 11.8, platelet count 226.  Sodium 135, potassium 4.7, chloride 94, CO2 28, glucose 243, BUN 59, creatinine 5.18.  BNP 1212.2.  High-sensitivity troponin 130>133>149.  Urinalysis with moderate leukocytes, negative nitrite, many bacteria, greater than 50 WBCs.  Chest x-ray with cardiomegaly, mild pulmonary edema with superimposed infection not excluded, aortic atherosclerosis.  Patient was started on meropenem with history of ESBL UTI.  TRH consulted for admission for further evaluation management of urinary tract infection.    Assessment & Plan:    Urinary tract infection History of ESBL E. coli UTI Patient presenting to ED with abdominal pain associate with nausea.  Patient with elevated WBC count of 12.1 and urinalysis consistent with UTI.  History of Klebsiella pneumonia and ESBL E. coli UTI in the past.  CT abdomen/pelvis with contrast with changes consistent with UTI with dilation of the right renal collecting system and ureter with enhancement of the walls, no discrete stone identified, no parenchymal findings suggest pyelonephritis. -- WBC 12.1>16.1>11.1 -- Urine culture: Pending -- Meropenem 500 mg IV every 24 hours -- CBC in a.m.  Acute hypoxemic respiratory failure, POA Acute on chronic diastolic congestive heart failure Patient presenting with shortness of breath,  last hemodialysis on Friday 01/18/2024.  BNP elevated 1212.  Chest x-ray with cardiomegaly, mild pulmonary edema.  CT abdomen/pelvis notable for pleural effusion. -- Nephrology consulted for continue hemodialysis while inpatient, volume management --1200 mL fluid restriction -- Strict I's and O's, daily weights -- Oxygen now weaned off -- Repeat BMP in the a.m.  Elevated troponin likely secondary to type II demand ischemia Patient with elevated troponin [phone]; likely secondary to type II demand ischemia in the setting of UTI and acute on chronic CHF exacerbation as above with volume overload vs decreased urinary clearance given history of ESRD.  EKG with normal sinus rhythm, no concerning dynamic changes. -- Continue monitor on telemetry  ESRD on HD MWF -- Nephrology following for continued HD while inpatient  Paroxysmal atrial fibrillation On admission, EKG with normal sinus rhythm. -- Metoprolol tartrate 12.5 mg p.o. daily as needed HR >100 and SBP >160 -- Eliquis 2.5 mg p.o. twice daily  Hypothyroidism -- Levothyroxine 88 mcg p.o. daily  Type 1 diabetes mellitus Hemoglobin A1c 5.2 on 05/16/2023, well-controlled. -- Diabetic educator following, appreciate assistance -- Continue insulin pump  CAD s/p PCI Follows with cardiology outpatient.   DVT prophylaxis: apixaban (ELIQUIS) tablet 2.5 mg Start: 01/21/24 1000 apixaban (ELIQUIS) tablet 2.5 mg    Code Status: Prior Family Communication: No family present at bedside this morning  Disposition Plan:  Level of care: Progressive Status is: Inpatient Remains inpatient appropriate because: IV antibiotics, hemodialysis    Consultants:  Nephrology  Procedures:  None  Antimicrobials:  Meropenem 3/23   Subjective: Patient seen and examined at bedside, lying in bed.  Oxygen now weaned off.  Remains on IV meropenem with  history of ESBL E. coli UTI.  Urine culture remains pending.  Patient's caregiver and daughter this  morning.  No other specific questions, concerns or complaints at this time.  Denies headache, no dizziness, no chest pain, no palpitations, no fever/chills/night sweats, no nausea cefonicid diarrhea, no focal weakness, no fatigue, no paresthesia.  No acute events overnight per nursing staff.  Objective: Vitals:   01/22/24 0200 01/22/24 0717 01/22/24 0718 01/22/24 0719  BP: (!) 114/42 132/63    Pulse:  79    Resp: 18 20    Temp: 97.9 F (36.6 C) 97.9 F (36.6 C)    TempSrc: Oral Oral    SpO2:  100% 100% 100%  Weight:      Height:        Intake/Output Summary (Last 24 hours) at 01/22/2024 1046 Last data filed at 01/21/2024 1700 Gross per 24 hour  Intake --  Output 2500 ml  Net -2500 ml   Filed Weights   01/20/24 1847 01/21/24 1847  Weight: 60.9 kg 56.9 kg    Examination:  Physical Exam: GEN: NAD, alert and oriented x 3, wd/wn HEENT: NCAT, PERRL, EOMI, sclera clear, MMM PULM: CTAb w/ w/r/r, normal respiratory effort without accessory muscle use, on room air with SpO2 100% at rest CV: RRR w/ murmur LUSB GI: abd soft, NTND, + BS MSK: no peripheral edema, moves all extremities independently NEURO: No focal neurological deficit PSYCH: normal mood/affect Integumentary: No concerning rashes/lesions/wounds noted on exposed surfaces.    Data Reviewed: I have personally reviewed following labs and imaging studies  CBC: Recent Labs  Lab 01/20/24 1856 01/21/24 1320 01/22/24 0232  WBC 12.1* 16.1* 11.1*  HGB 11.8* 11.6* 10.9*  HCT 38.0 36.0 34.1*  MCV 96.0 93.8 94.5  PLT 226 240 239   Basic Metabolic Panel: Recent Labs  Lab 01/20/24 1856 01/21/24 1320 01/22/24 0232  NA 135 134* 135  K 4.7 5.3* 4.8  CL 94* 95* 94*  CO2 28 25 29   GLUCOSE 243* 271* 125*  BUN 59* 69* 37*  CREATININE 5.18* 5.77* 4.23*  CALCIUM 9.6 9.3 9.0  PHOS  --  5.0* 4.3   GFR: Estimated Creatinine Clearance: 8.9 mL/min (A) (by C-G formula based on SCr of 4.23 mg/dL (H)). Liver Function  Tests: Recent Labs  Lab 01/21/24 1320 01/22/24 0232  ALBUMIN 3.6 3.3*   No results for input(s): "LIPASE", "AMYLASE" in the last 168 hours. No results for input(s): "AMMONIA" in the last 168 hours. Coagulation Profile: No results for input(s): "INR", "PROTIME" in the last 168 hours. Cardiac Enzymes: No results for input(s): "CKTOTAL", "CKMB", "CKMBINDEX", "TROPONINI" in the last 168 hours. BNP (last 3 results) No results for input(s): "PROBNP" in the last 8760 hours. HbA1C: No results for input(s): "HGBA1C" in the last 72 hours. CBG: Recent Labs  Lab 01/21/24 0323 01/21/24 2250 01/22/24 0246 01/22/24 0633  GLUCAP 250* 155* 126* 154*   Lipid Profile: No results for input(s): "CHOL", "HDL", "LDLCALC", "TRIG", "CHOLHDL", "LDLDIRECT" in the last 72 hours. Thyroid Function Tests: No results for input(s): "TSH", "T4TOTAL", "FREET4", "T3FREE", "THYROIDAB" in the last 72 hours. Anemia Panel: No results for input(s): "VITAMINB12", "FOLATE", "FERRITIN", "TIBC", "IRON", "RETICCTPCT" in the last 72 hours. Sepsis Labs: No results for input(s): "PROCALCITON", "LATICACIDVEN" in the last 168 hours.  No results found for this or any previous visit (from the past 240 hours).       Radiology Studies: CT ABDOMEN PELVIS W CONTRAST Result Date: 01/20/2024 CLINICAL DATA:  Abdominal pain and  shortness of breath, initial encounter EXAM: CT ABDOMEN AND PELVIS WITH CONTRAST TECHNIQUE: Multidetector CT imaging of the abdomen and pelvis was performed using the standard protocol following bolus administration of intravenous contrast. RADIATION DOSE REDUCTION: This exam was performed according to the departmental dose-optimization program which includes automated exposure control, adjustment of the mA and/or kV according to patient size and/or use of iterative reconstruction technique. CONTRAST:  75mL OMNIPAQUE IOHEXOL 350 MG/ML SOLN COMPARISON:  06/11/2023 FINDINGS: Lower chest: Small right-sided  pleural effusion is noted. No focal infiltrate or parenchymal nodule is seen. Postsurgical changes in the left breast are noted. Hepatobiliary: No focal liver abnormality is seen. Status post cholecystectomy. No biliary dilatation. Pancreas: Unremarkable. No pancreatic ductal dilatation or surrounding inflammatory changes. Spleen: Normal in size without focal abnormality. Adrenals/Urinary Tract: Adrenal glands are within normal limits. Kidneys are well visualized within normal enhancement pattern bilaterally. Hydronephrotic changes are noted on the right with enhancement of the walls of the collecting system and ureter consistent with underlying UTI. No definitive changes of pyelonephritis are seen. The bladder is decompressed. Stomach/Bowel: No obstructive or inflammatory changes of the colon are noted. The appendix is not well visualized consistent with a prior surgical history. Small bowel and stomach appear within normal limits. Vascular/Lymphatic: Aortic atherosclerosis. No enlarged abdominal or pelvic lymph nodes. Reproductive: Status post hysterectomy. No adnexal masses. Other: No abdominal wall hernia or abnormality. No abdominopelvic ascites. Musculoskeletal: No acute or significant osseous findings. IMPRESSION: Changes most consistent with UTI with dilatation of the right renal collecting system and ureter with enhancement of the walls. No discrete stone is identified. No parenchymal findings to suggest pyelonephritis are seen. Small right-sided pleural effusion. Electronically Signed   By: Alcide Clever M.D.   On: 01/20/2024 21:40   DG Chest Portable 1 View Result Date: 01/20/2024 CLINICAL DATA:  sob dialysis pt. SOB dialysis pt Per triage notes: "Shortness of breath today with abd pain nausea also fistula rt arm she was last dialyzed Friday" Pt has hx of CHF, diabetes, and htn. EXAM: PORTABLE CHEST 1 VIEW COMPARISON:  Chest x-ray 07/13/2023 FINDINGS: The heart and mediastinal contours are unchanged.  Atherosclerotic plaque. Slight patchy airspace opacities. Increased interstitial markings. No pleural effusion. No pneumothorax. No acute osseous abnormality.  Left chest wall vascular clips. IMPRESSION: 1. Cardiomegaly with mild pulmonary edema with superimposed infection not excluded. 2.  Aortic Atherosclerosis (ICD10-I70.0). Electronically Signed   By: Tish Frederickson M.D.   On: 01/20/2024 19:43        Scheduled Meds:  apixaban  2.5 mg Oral BID   Chlorhexidine Gluconate Cloth  6 each Topical Q0600   insulin pump   Subcutaneous TID WC, HS, 0200   levothyroxine  88 mcg Oral QAC breakfast   Continuous Infusions:  anticoagulant sodium citrate     meropenem (MERREM) IV 500 mg (01/21/24 2320)     LOS: 1 day    Time spent: 48 minutes spent on 01/22/2024 caring for this patient face-to-face including chart review, ordering labs/tests, documenting, discussion with nursing staff, consultants, updating family and interview/physical exam    Alvira Philips [place], DO [facility] Available via Epic secure chat 7am-7pm After these hours, please refer to coverage provider listed on [email or web address] 01/22/2024, 10:46 AM

## 2024-01-23 ENCOUNTER — Encounter: Payer: Self-pay | Admitting: Hematology

## 2024-01-23 ENCOUNTER — Other Ambulatory Visit (HOSPITAL_COMMUNITY): Payer: Self-pay

## 2024-01-23 DIAGNOSIS — N10 Acute tubulo-interstitial nephritis: Secondary | ICD-10-CM | POA: Diagnosis not present

## 2024-01-23 LAB — CBC
HCT: 34.5 % — ABNORMAL LOW (ref 36.0–46.0)
Hemoglobin: 11 g/dL — ABNORMAL LOW (ref 12.0–15.0)
MCH: 30 pg (ref 26.0–34.0)
MCHC: 31.9 g/dL (ref 30.0–36.0)
MCV: 94 fL (ref 80.0–100.0)
Platelets: 247 10*3/uL (ref 150–400)
RBC: 3.67 MIL/uL — ABNORMAL LOW (ref 3.87–5.11)
RDW: 15.1 % (ref 11.5–15.5)
WBC: 8.8 10*3/uL (ref 4.0–10.5)
nRBC: 0 % (ref 0.0–0.2)

## 2024-01-23 LAB — URINE CULTURE: Culture: >=100000 — AB

## 2024-01-23 LAB — RENAL FUNCTION PANEL
Albumin: 3 g/dL — ABNORMAL LOW (ref 3.5–5.0)
Anion gap: 15 (ref 5–15)
BUN: 64 mg/dL — ABNORMAL HIGH (ref 8–23)
CO2: 26 mmol/L (ref 22–32)
Calcium: 8.5 mg/dL — ABNORMAL LOW (ref 8.9–10.3)
Chloride: 92 mmol/L — ABNORMAL LOW (ref 98–111)
Creatinine, Ser: 6.3 mg/dL — ABNORMAL HIGH (ref 0.44–1.00)
GFR, Estimated: 6 mL/min — ABNORMAL LOW (ref >60)
Glucose, Bld: 152 mg/dL — ABNORMAL HIGH (ref 70–99)
Phosphorus: 5 mg/dL — ABNORMAL HIGH (ref 2.5–4.6)
Potassium: 5 mmol/L (ref 3.5–5.1)
Sodium: 133 mmol/L — ABNORMAL LOW (ref 135–145)

## 2024-01-23 LAB — GLUCOSE, CAPILLARY
Glucose-Capillary: 143 mg/dL — ABNORMAL HIGH (ref 70–99)
Glucose-Capillary: 131 mg/dL — ABNORMAL HIGH (ref 70–99)
Glucose-Capillary: 245 mg/dL — ABNORMAL HIGH (ref 70–99)

## 2024-01-23 LAB — HEPATITIS B SURFACE ANTIBODY, QUANTITATIVE: Hep B S AB Quant (Post): 1885 m[IU]/mL

## 2024-01-23 MED ORDER — CEPHALEXIN 250 MG PO CAPS
250.0000 mg | ORAL_CAPSULE | Freq: Every day | ORAL | 0 refills | Status: AC
  Filled 2024-01-23: qty 4, 4d supply, fill #0

## 2024-01-23 MED ORDER — CEPHALEXIN 250 MG PO CAPS
250.0000 mg | ORAL_CAPSULE | Freq: Every day | ORAL | Status: DC
  Administered 2024-01-23: 250 mg via ORAL
  Filled 2024-01-23: qty 1

## 2024-01-23 NOTE — Discharge Instructions (Addendum)
 Elizabeth Olsen,  You are in the hospital and treated for urinary tract infection in addition to fluid overload causing you to have trouble breathing.  He had significant improved with hemodialysis and antibiotics.  Please follow-up with your primary care physician for hospital follow-up.

## 2024-01-23 NOTE — Discharge Summary (Signed)
 Physician Discharge Summary   Patient: Elizabeth Olsen MRN: [id number] DOB: 1941/09/16  Admit date:     01/20/2024  Discharge date: 01/23/24  Discharge Physician: Jacquelin Hawking, MD   PCP: Georgina Quint, MD   Recommendations at discharge:  PCP visit for hospital follow-up Nephrology follow-up  Discharge Diagnoses: Principal Problem:   Acute pyelonephritis Active Problems:   Type 1 diabetes (HCC)   Atrial fibrillation with RVR (HCC)   Hemodialysis patient (HCC)   Volume overload  Resolved Problems:   * No resolved hospital problems. *  Hospital Course: Elizabeth Olsen is a 83 y.o. female with a history of ESRD on hemodialysis, chronic diastolic heart failure, CAD status post PCI, diabetes mellitus type 2 on insulin pump, hyperlipidemia, paroxysmal atrial fibrillation on anticoagulation, breast cancer, recurrent UTI with history of ESBL E. coli.  Patient presented secondary to shortness of breath, nausea, abdominal pain was found to have evidence of urinary tract infection with associated heart failure exacerbation requiring fluid management via hemodialysis.  Patient started empirically on meropenem for history of ESBL E. coli infection.  Urine culture significant for Klebsiella pneumoniae and patient was transition to Keflex.  Regarding fluid status, patient manage successfully with hemodialysis with ability to wean off oxygen..  Assessment and Plan:  UTI Patient with history of ESBL E. coli UTI in the past.  Patient was initially managed empirically with meropenem IV.  Urine culture significant for Klebsiella pneumoniae infection with resistance only to ampicillin.  Patient transitioned to Keflex renally dosed, to complete a 7-day course of antibiotics.  Acute respiratory failure with hypoxia Secondary to acute heart failure. Patient requiring up to 4 L/min of supplemental oxygen. Weaned to room air.   Acute on chronic diastolic heart failure History of elevated BNP over 1212  with mild pulmonary edema and cardiomegaly noted on chest x-ray.  CT abdomen/pelvis was significant for pleural effusion.  Patient's fluid status was managed via hemodialysis with improvement of symptoms.   Demand ischemia Patient with elevated troponin of 130 > 133 > 149 in setting of likely demand ischemia from UTI and acute heart failure and complicated by ESRD status.  No chest pain.  No acute ST-T segment changes.   ESRD on HD Nephrology consulted.  Patient managed via hemodialysis while admitted.  Continue hemodialysis as an outpatient.   Paroxysmal atrial fibrillation Noted on admission.  Patient now in sinus rhythm.  Continue Eliquis and metoprolol.   Hypothyroidism Continue levothyroxine   Diabetes mellitus type 1 Continue insulin pump   CAD S/p PCI history.   Right pleural effusion Noted on CT imaging.  No intervention performed.  Respiratory status improved with hemodialysis and fluid management.    Consultants: Nephrology Procedures performed: Hemodialysis Disposition: Home Diet recommendation: Renal diet   DISCHARGE MEDICATION: Allergies as of 01/23/2024       Reactions   Erythromycin Shortness Of Breath, Swelling   "throat swelling", sob, thrashing ,    Penicillins Rash   Tape Other (See Comments)   Patient states that she prefers paper tape because other tapes tear skin.   Dextromethorphan Rash   Doxylamine-dm Rash   Nyquil [pseudoeph-doxylamine-dm-apap] Rash   Sulfa Antibiotics Rash        Medication List     TAKE these medications    acetaminophen 325 MG tablet Commonly known as: TYLENOL Take by mouth every 6 (six) hours as needed for headache, fever or mild pain (pain score 1-3).   apixaban 2.5 MG Tabs tablet Commonly known as:  ELIQUIS Take 1 tablet (2.5 mg total) by mouth 2 (two) times daily.   ascorbic Acid 500 MG Cpcr Commonly known as: VITAMIN C Take 500 mg by mouth every evening.   Baqsimi One Pack 3 MG/DOSE Powd Generic drug:  Glucagon As needed for severe hypoglycemia   BODY POWDER MEDICATED EX Apply 1 application  topically daily. Apply under breast and stomach   cephALEXin 250 MG capsule Commonly known as: KEFLEX Take 1 capsule (250 mg total) by mouth at bedtime for 4 days.   cloNIDine 0.1 MG tablet Commonly known as: CATAPRES Take 1 tablet (0.1 mg total) by mouth daily as needed (FOR SYSTOLIC BLOOD PRESSURE GREATER THAN 170 AND OR DIASTOLIC FOR BLOOD PRESSURE GREATER 100).   insulin lispro 100 UNIT/ML injection Commonly known as: HUMALOG USE AS DIRECTED VIA INSULIN PUMP. MAX DAILY DOSAGE IS 50 UNITS   Lantus SoloStar 100 UNIT/ML Solostar Pen Generic drug: insulin glargine Inject into the skin as needed.   levothyroxine 88 MCG tablet Commonly known as: SYNTHROID Take 1 tablet (88 mcg total) by mouth daily before breakfast.   lidocaine-prilocaine cream Commonly known as: EMLA Apply topically once.   Lubricant Eye Nighttime Oint Apply 1 Application to eye at bedtime.   metoprolol tartrate 25 MG tablet Commonly known as: LOPRESSOR Take 12.5 mg by mouth daily as needed (IF HR more than 100 and syst BP more than 160 per daughter).   Omnipod 5 DexG7G6 Pods Gen 5 Misc Inject into the skin.   Velphoro 500 MG chewable tablet Generic drug: sucroferric oxyhydroxide Chew 500 mg by mouth 5 (five) times daily as needed.   zinc gluconate 50 MG tablet Take 50 mg by mouth every evening.        Follow-up Information     Georgina Quint, MD. Go on 02/05/2024.   Specialty: Internal Medicine Why: @2 :00pm Contact information: [address] [place] [address] [phone]                Discharge Exam: BP (!) 162/68 (BP Location: Left Arm)   Pulse 81   Temp 97.9 F (36.6 C) (Oral)   Resp 13   Ht 5\' 4"  (1.626 m)   Wt 56.9 kg   SpO2 98%   BMI 21.53 kg/m   General exam: Appears calm and comfortable Respiratory system: Respiratory effort normal. Central nervous  system: Alert and oriented. No focal neurological deficits. Psychiatry: Judgement and insight appear normal. Mood & affect appropriate.   Condition at discharge: stable  The results of significant diagnostics from this hospitalization (including imaging, microbiology, ancillary and laboratory) are listed below for reference.   Imaging Studies: CT ABDOMEN PELVIS W CONTRAST Result Date: 01/20/2024 CLINICAL DATA:  Abdominal pain and shortness of breath, initial encounter EXAM: CT ABDOMEN AND PELVIS WITH CONTRAST TECHNIQUE: Multidetector CT imaging of the abdomen and pelvis was performed using the standard protocol following bolus administration of intravenous contrast. RADIATION DOSE REDUCTION: This exam was performed according to the departmental dose-optimization program which includes automated exposure control, adjustment of the mA and/or kV according to patient size and/or use of iterative reconstruction technique. CONTRAST:  75mL OMNIPAQUE IOHEXOL 350 MG/ML SOLN COMPARISON:  06/11/2023 FINDINGS: Lower chest: Small right-sided pleural effusion is noted. No focal infiltrate or parenchymal nodule is seen. Postsurgical changes in the left breast are noted. Hepatobiliary: No focal liver abnormality is seen. Status post cholecystectomy. No biliary dilatation. Pancreas: Unremarkable. No pancreatic ductal dilatation or surrounding inflammatory changes. Spleen: Normal in size without focal  abnormality. Adrenals/Urinary Tract: Adrenal glands are within normal limits. Kidneys are well visualized within normal enhancement pattern bilaterally. Hydronephrotic changes are noted on the right with enhancement of the walls of the collecting system and ureter consistent with underlying UTI. No definitive changes of pyelonephritis are seen. The bladder is decompressed. Stomach/Bowel: No obstructive or inflammatory changes of the colon are noted. The appendix is not well visualized consistent with a prior surgical history.  Small bowel and stomach appear within normal limits. Vascular/Lymphatic: Aortic atherosclerosis. No enlarged abdominal or pelvic lymph nodes. Reproductive: Status post hysterectomy. No adnexal masses. Other: No abdominal wall hernia or abnormality. No abdominopelvic ascites. Musculoskeletal: No acute or significant osseous findings. IMPRESSION: Changes most consistent with UTI with dilatation of the right renal collecting system and ureter with enhancement of the walls. No discrete stone is identified. No parenchymal findings to suggest pyelonephritis are seen. Small right-sided pleural effusion. Electronically Signed   By: Alcide Clever M.D.   On: 01/20/2024 21:40   DG Chest Portable 1 View Result Date: 01/20/2024 CLINICAL DATA:  sob dialysis pt. SOB dialysis pt Per triage notes: "Shortness of breath today with abd pain nausea also fistula rt arm she was last dialyzed Friday" Pt has hx of CHF, diabetes, and htn. EXAM: PORTABLE CHEST 1 VIEW COMPARISON:  Chest x-ray 07/13/2023 FINDINGS: The heart and mediastinal contours are unchanged. Atherosclerotic plaque. Slight patchy airspace opacities. Increased interstitial markings. No pleural effusion. No pneumothorax. No acute osseous abnormality.  Left chest wall vascular clips. IMPRESSION: 1. Cardiomegaly with mild pulmonary edema with superimposed infection not excluded. 2.  Aortic Atherosclerosis (ICD10-I70.0). Electronically Signed   By: Tish Frederickson M.D.   On: 01/20/2024 19:43    Microbiology: Results for orders placed or performed during the hospital encounter of 01/20/24  Urine Culture (for pregnant, neutropenic or urologic patients or patients with an indwelling urinary catheter)     Status: Abnormal   Collection Time: 01/21/24  7:58 AM   Specimen: Urine, Clean Catch  Result Value Ref Range Status   Specimen Description URINE, CLEAN CATCH  Final   Special Requests   Final    NONE Performed at [facility], [address], [place],  [place] [address]    Culture >=100,000 COLONIES/mL KLEBSIELLA PNEUMONIAE (A)  Final   Report Status 01/23/2024 FINAL  Final   Organism ID, Bacteria KLEBSIELLA PNEUMONIAE (A)  Final      Susceptibility   Klebsiella pneumoniae - MIC*    AMPICILLIN RESISTANT Resistant     CEFAZOLIN <=4 SENSITIVE Sensitive     CEFEPIME <=0.12 SENSITIVE Sensitive     CEFTRIAXONE <=0.25 SENSITIVE Sensitive     CIPROFLOXACIN <=0.25 SENSITIVE Sensitive     GENTAMICIN <=1 SENSITIVE Sensitive     IMIPENEM <=0.25 SENSITIVE Sensitive     NITROFURANTOIN 32 SENSITIVE Sensitive     TRIMETH/SULFA <=20 SENSITIVE Sensitive     AMPICILLIN/SULBACTAM <=2 SENSITIVE Sensitive     PIP/TAZO <=4 SENSITIVE Sensitive ug/mL    * >=100,000 COLONIES/mL KLEBSIELLA PNEUMONIAE    Labs: CBC: Recent Labs  Lab 01/20/24 1856 01/21/24 1320 01/22/24 0232 01/23/24 0254  WBC 12.1* 16.1* 11.1* 8.8  HGB 11.8* 11.6* 10.9* 11.0*  HCT 38.0 36.0 34.1* 34.5*  MCV 96.0 93.8 94.5 94.0  PLT 226 240 239 247   Basic Metabolic Panel: Recent Labs  Lab 01/20/24 1856 01/21/24 1320 01/22/24 0232 01/23/24 0254  NA 135 134* 135 133*  K 4.7 5.3* 4.8 5.0  CL 94* 95* 94* 92*  CO2 28 25 29 26   GLUCOSE 243* 271* 125* 152*  BUN 59* 69* 37* 64*  CREATININE 5.18* 5.77* 4.23* 6.30*  CALCIUM 9.6 9.3 9.0 8.5*  PHOS  --  5.0* 4.3 5.0*   Liver Function Tests: Recent Labs  Lab 01/21/24 1320 01/22/24 0232 01/23/24 0254  ALBUMIN 3.6 3.3* 3.0*   CBG: Recent Labs  Lab 01/22/24 1554 01/22/24 2147 01/23/24 0316 01/23/24 0534 01/23/24 1114  GLUCAP 180* 177* 143* 131* 245*    Discharge time spent: 35 minutes.  Signed: Jacquelin Hawking, MD [facility] 01/23/2024

## 2024-01-23 NOTE — Hospital Course (Signed)
 Elizabeth Olsen is a 83 y.o. female with a history of ESRD on hemodialysis, chronic diastolic heart failure, CAD status post PCI, diabetes mellitus type 2 on insulin pump, hyperlipidemia, paroxysmal atrial fibrillation on anticoagulation, breast cancer, recurrent UTI with history of ESBL E. coli.  Patient presented secondary to shortness of breath, nausea, abdominal pain was found to have evidence of urinary tract infection with associated heart failure exacerbation requiring fluid management via hemodialysis.  Patient started empirically on meropenem for history of ESBL E. coli infection.  Urine culture significant for Klebsiella pneumoniae and patient was transition to Keflex.  Regarding fluid status, patient manage successfully with hemodialysis with ability to wean off oxygen.Marland Kitchen

## 2024-01-23 NOTE — Progress Notes (Signed)
 Received patient in bed to unit.  Alert and oriented.  Informed consent signed and in chart.   TX duration: 3  Patient tolerated well.  Transported back to the room  Alert, without acute distress.  Hand-off given to patient's nurse.   Access used: Right AVF Access issues: None  Total UF removed: 2000 ml Medication(s) given: See [facility]   01/23/24 1848  Vitals  Temp 98 F (36.7 C)  Pulse Rate 87  Resp 13  BP (!) 100/49  SpO2 100 %  Weight 61.4 kg  During Treatment Monitoring  Blood Flow Rate (mL/min) 249 mL/min  Arterial Pressure (mmHg) -148.68 mmHg  Venous Pressure (mmHg) 115.55 mmHg  TMP (mmHg) 27.47 mmHg  Ultrafiltration Rate (mL/min) 796 mL/min  Dialysate Flow Rate (mL/min) 299 ml/min  Dialysate Potassium Concentration 2  Dialysate Calcium Concentration 2.5  Duration of HD Treatment -hour(s) 3 hour(s)  Cumulative Fluid Removed (mL) per Treatment  2067.1  Intra-Hemodialysis Comments Tx completed

## 2024-01-23 NOTE — Plan of Care (Signed)

## 2024-01-23 NOTE — Progress Notes (Signed)
 Discharge instructions given to pt and daughter Byrd Hesselbach (via phone). IV removed, belongings with pt and caregiver. All questions answered appropriately. Pt taken down by staff to be picked up by grandson. VSS. No further complaints.

## 2024-01-23 NOTE — Progress Notes (Signed)
 Patient ID: Elizabeth Olsen, female   DOB: 1941-10-15, 83 y.o.   MRN: [id number] S: Feels better O:BP 139/81 (BP Location: Left Arm)   Pulse 78   Temp 97.9 F (36.6 C) (Oral)   Resp 13   Ht 5\' 4"  (1.626 m)   Wt 56.9 kg   SpO2 98%   BMI 21.53 kg/m   Intake/Output Summary (Last 24 hours) at 01/23/2024 1055 Last data filed at 01/22/2024 1300 Gross per 24 hour  Intake 177 ml  Output --  Net 177 ml   Intake/Output: I/O last 3 completed shifts: In: 177 [P.O.:177] Out: -   Intake/Output this shift:  No intake/output data recorded. Weight change: 0 kg Gen: NAD CVS: RRr Resp:CTA Abd: +BS, soft, Nt/ND Ext: no edema, RAVF +T/B  Recent Labs  Lab 01/20/24 1856 01/21/24 1320 01/22/24 0232 01/23/24 0254  NA 135 134* 135 133*  K 4.7 5.3* 4.8 5.0  CL 94* 95* 94* 92*  CO2 28 25 29 26   GLUCOSE 243* 271* 125* 152*  BUN 59* 69* 37* 64*  CREATININE 5.18* 5.77* 4.23* 6.30*  ALBUMIN  --  3.6 3.3* 3.0*  CALCIUM 9.6 9.3 9.0 8.5*  PHOS  --  5.0* 4.3 5.0*   Liver Function Tests: Recent Labs  Lab 01/21/24 1320 01/22/24 0232 01/23/24 0254  ALBUMIN 3.6 3.3* 3.0*   No results for input(s): "LIPASE", "AMYLASE" in the last 168 hours. No results for input(s): "AMMONIA" in the last 168 hours. CBC: Recent Labs  Lab 01/20/24 1856 01/21/24 1320 01/22/24 0232 01/23/24 0254  WBC 12.1* 16.1* 11.1* 8.8  HGB 11.8* 11.6* 10.9* 11.0*  HCT 38.0 36.0 34.1* 34.5*  MCV 96.0 93.8 94.5 94.0  PLT 226 240 239 247   Cardiac Enzymes: No results for input(s): "CKTOTAL", "CKMB", "CKMBINDEX", "TROPONINI" in the last 168 hours. CBG: Recent Labs  Lab 01/22/24 1112 01/22/24 1554 01/22/24 2147 01/23/24 0316 01/23/24 0534  GLUCAP 162* 180* 177* 143* 131*    Iron Studies: No results for input(s): "IRON", "TIBC", "TRANSFERRIN", "FERRITIN" in the last 72 hours. Studies/Results: No results found.  apixaban  2.5 mg Oral BID   Chlorhexidine Gluconate Cloth  6 each Topical Q0600   insulin pump    Subcutaneous TID WC, HS, 0200   levothyroxine  88 mcg Oral QAC breakfast    BMET    Component Value Date/Time   NA 133 (L) 01/23/2024 0254   NA 130 (L) 05/21/2020 0952   K 5.0 01/23/2024 0254   CL 92 (L) 01/23/2024 0254   CO2 26 01/23/2024 0254   GLUCOSE 152 (H) 01/23/2024 0254   BUN 64 (H) 01/23/2024 0254   BUN 38 (H) 05/21/2020 0952   CREATININE 6.30 (H) 01/23/2024 0254   CREATININE 2.00 (H) 02/10/2020 0925   CALCIUM 8.5 (L) 01/23/2024 0254   GFRNONAA 6 (L) 01/23/2024 0254   GFRNONAA 23 (L) 02/10/2020 0925   GFRAA 22 (L) 05/21/2020 0952   GFRAA 27 (L) 02/10/2020 0925   CBC    Component Value Date/Time   WBC 8.8 01/23/2024 0254   RBC 3.67 (L) 01/23/2024 0254   HGB 11.0 (L) 01/23/2024 0254   HGB 10.8 (L) 07/07/2020 0938   HGB 10.9 (L) 03/17/2020 1134   HGB 12.3 02/14/2007 0909   HCT 34.5 (L) 01/23/2024 0254   HCT 34.3 03/17/2020 1134   HCT 35.6 02/14/2007 0909   PLT 247 01/23/2024 0254   PLT 266 07/07/2020 0938   PLT 505 (H) 03/17/2020 1134   MCV 94.0  01/23/2024 0254   MCV 89 03/17/2020 1134   MCV 87.5 02/14/2007 0909   MCH 30.0 01/23/2024 0254   MCHC 31.9 01/23/2024 0254   RDW 15.1 01/23/2024 0254   RDW 15.0 03/17/2020 1134   RDW 13.1 02/14/2007 0909   LYMPHSABS 0.8 07/13/2023 1609   LYMPHSABS 2.4 02/14/2007 0909   MONOABS 0.9 07/13/2023 1609   MONOABS 0.7 02/14/2007 0909   EOSABS 0.1 07/13/2023 1609   EOSABS 0.2 02/14/2007 0909   BASOSABS 0.0 07/13/2023 1609   BASOSABS 0.0 02/14/2007 0909    Dialysis Orders:  MWF - [facility] 4hrs, RAVF BFR 400, DFR 800,  EDW 58.4kg, 2K/ 2Ca Heparin None  Calcitriol PO qHD - 01/18/24 Home meds:   Assessment/Plan: Volume overload/SOB - Noted patient was on CPAP overnight and Lasix X1 was given. Resolved after HD and is now on room air. Urinary Tract Infection - CT Abd/pelvis showed dilatation of right renal collection system and ureter. On IV Meropenem.  Klebsiella that is sensitive to everything  except ampicillin.  Plan per primary.  She is allergic to PCN so cipro may be best option.  ESRD - on HD MWF. On HD  Hypertension/volume  - Blood pressures stable and severely overloaded.  Anemia of CKD - Hgb 11.6, no Fe/ESA is indicated at this time Secondary Hyperparathyroidism -  Ca and Phos at goal Nutrition - Renal diet with fluid restrictions Disposition - pt is stable for discharge from renal standpoint.  Would prefer her to f/u with her outpatient unit given high HD census.  Irena Cords, MD [facility]

## 2024-01-24 ENCOUNTER — Other Ambulatory Visit (HOSPITAL_COMMUNITY): Payer: Self-pay

## 2024-01-24 ENCOUNTER — Telehealth: Payer: Self-pay | Admitting: *Deleted

## 2024-01-24 NOTE — Discharge Planning (Signed)
 [facility] Patient Discharge Orders- [facility] CLINIC: [facility]  Patient's name: JOLENE GUYETT Admit/DC Dates: 01/20/2024 - 01/23/2024  Discharge Diagnoses: UTI - tx'd empirically on IV Meropenem. Now on home PO ABXs, see below  Acute respiratory failure-now on RA Demand ischemia R pleural effusion - improved with HD   Aranesp: Given: No    Last Hgb: 11.0 PRBC's Given: No  ESA dose for discharge: N/A IV Iron dose at discharge: N/A  Heparin change: N/A  EDW Change: No  Bath Change: No  Access intervention/Change: No  Calcitriol change: No  Discharge Labs: Calcium 8.5 Phosphorus 5.0 Albumin 3.0 K+ 5.0  IV Antibiotics: Yes Details: UTI. Urine cx (+) Klebsiella pneumoniae infection with resistance only to ampicillin. Now on PO Keflex 250mg  at bedtime X 7 days  On Coumadin?: No   OTHER/APPTS/LAB ORDERS:    D/C Meds to be reconciled by nurse after every discharge.  Completed By: Salome Holmes, NP   Reviewed by: MD:______ RN_______

## 2024-01-24 NOTE — Patient Outreach (Signed)
 This encounter was created in error - please disregard.  Pls call/ message for questions,  Caryl Pina, RN, BSN, CCRN Alumnus RN Care Manager  Transitions of Care  [facility] [phone]: direct office

## 2024-01-24 NOTE — Progress Notes (Signed)
 Late Note Entry- January 24, 2024  Pt was d/c yesterday afternoon. Contacted [facility] this morning to advise staff of of pt's d/c date and that pt should resume care tomorrow.   Olivia Canter Renal Navigator [phone]

## 2024-01-25 ENCOUNTER — Telehealth: Payer: Self-pay | Admitting: *Deleted

## 2024-01-25 NOTE — Transitions of Care (Post Inpatient/ED Visit) (Signed)
 01/25/2024  Name: Elizabeth Olsen MRN: [id number] DOB: August 02, 1941  Today's TOC FU Call Status: Today's TOC FU Call Status:: Successful TOC FU Call Completed TOC FU Call Complete Date: 01/25/24 Patient's Name and Date of Birth confirmed.  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  She never misses hemodialysis; she has a 24-7 caregiver and I split my time between [place] and her home; she is never alone and well cared for, we make sure of that") Any questions or concerns?: No  Items Reviewed: Did you receive and understand the discharge instructions provided?: Yes (thoroughly reviewed with patient's caregiver who verbalizes good understanding of same) Medications obtained,verified, and reconciled?: Yes (Medications Reviewed) (Full medication reconciliation/ review completed; no concerns or discrepancies identified; confirmed patient obtained/ is taking all newly Rx'd medications as instructed; caregiver-manages medications and denies questions/ concerns around medications) Any new allergies since your discharge?: No Dietary orders reviewed?: Yes Type of Diet Ordered:: "Very Healthy- mediterranean diet" Do you have support at home?: Yes People in Home: child(ren), adult, grandchild(ren) Name of Support/Comfort Primary Source: Reports independent in self-care activities; spouse assists as/ if needed/ indicated  Medications Reviewed Today: Medications Reviewed Today     Reviewed by Michaela Corner, RN (Registered Nurse) on 01/25/24 at [phone]  Med List Status: <None>   Medication Order Taking? Sig Documenting Provider  Last Dose Status Informant  acetaminophen (TYLENOL) 325 MG tablet [id number] Yes Take by mouth every 6 (six) hours as needed for headache, fever or mild pain (pain score 1-3). [provider] Taking Active Child, Pharmacy Records  apixaban (ELIQUIS) 2.5 MG TABS tablet [id number] No Take 1 tablet (2.5 mg total) by mouth 2 (two) times daily.  Patient not taking: Reported on 01/25/2024   Georgeanna Lea, MD Not Taking Active Child, Pharmacy Records           Med Note Michaela Corner   Fri Jan 25, 2024  9:53 AM) 01/25/24: caregiver/ daughter reports during [facility] call, "she has not taken this medication for a very long time"  ascorbic Acid (VITAMIN C) 500 MG CPCR [id number] Yes Take 500 mg by mouth every evening. [provider] Taking Active Child, Pharmacy Records  cephALEXin (KEFLEX) 250 MG capsule [id number] Yes Take 1 capsule (250 mg total) by mouth at bedtime for 4 days. Narda Bonds, MD Taking Active   cloNIDine (CATAPRES) 0.1 MG tablet [id number] Yes Take 1 tablet (0.1 mg total) by mouth daily as needed (FOR SYSTOLIC BLOOD PRESSURE GREATER THAN 170 AND OR DIASTOLIC FOR BLOOD PRESSURE GREATER 100). Faith Rogue, DO Taking Active Child, Pharmacy Records  Glucagon [facility] ONE PACK) 3 MG/DOSE POWD [id number] Yes As needed for severe hypoglycemia [provider] Taking Active Child, Pharmacy Records  Insulin Disposable Pump (OMNIPOD 5 [id number] PODS GEN 5) MISC [id number] Yes Inject into the skin. [provider] Taking Active Child, Pharmacy Records  insulin glargine (LANTUS SOLOSTAR) 100 UNIT/ML Solostar Pen [id number] Yes Inject into the skin as needed. [provider] Taking Active Child, Pharmacy Records           Med Note (LEE, NICOLE   Mon Jan 21, 2024 12:10 AM) Pen is used only  for emergencies.  Dose unknown at this time.   insulin lispro (HUMALOG) 100 UNIT/ML injection [id number] Yes USE AS DIRECTED VIA INSULIN PUMP. MAX DAILY DOSAGE IS 50 UNITS  [provider] Taking Active Child, Pharmacy Records  levothyroxine (SYNTHROID) 88 MCG tablet [id number] Yes Take 1 tablet (88 mcg total) by mouth daily before breakfast. Georgina Quint, MD Taking Active Child, Pharmacy Records  lidocaine-prilocaine (EMLA) cream [id number] Yes Apply topically once. [provider] Taking Active Child, Pharmacy Records  Menthol-Zinc Oxide (BODY POWDER MEDICATED EX) [id number] Yes Apply 1 application  topically daily. Apply under breast and stomach [provider] Taking Active Child, Pharmacy Records  metoprolol tartrate (LOPRESSOR) 25 MG tablet [id number] Yes Take 12.5 mg by mouth daily as needed (IF HR more than 100 and syst BP more than 160 per daughter). [provider] Taking Active Child, Pharmacy Records           Med Note (RAKHIMOVA, Donato Schultz May 31, 2023  6:22 AM) Never had to have it.  VELPHORO 500 MG chewable tablet [id number] Yes Chew 500 mg by mouth 5 (five) times daily as needed. [provider] Taking Active Child, Pharmacy Records  White Petrolatum-Mineral Oil (LUBRICANT EYE NIGHTTIME) OINT [id number] Yes Apply 1 Application to eye at bedtime. [provider] Taking Active Child, Pharmacy Records  zinc gluconate 50 MG tablet [id number] Yes Take 50 mg by mouth every evening. [provider] Taking Active Child, Pharmacy Records  Med List Note Charlyne Petrin, Carolynn Sayers 01/21/24 0011): Dialysis Mon-Wed-Fri            Home Care and Equipment/Supplies: Were Home Health Services Ordered?: No Any new equipment or medical supplies ordered?: No  Functional Questionnaire: Do you need assistance with bathing/showering or dressing?: Yes (family and private duty caregiver supervise all self-care activities) Do you need assistance with meal preparation?: Yes (family and private duty caregiver prepare all meals) Do you need assistance with eating?: No Do you have difficulty maintaining  continence: No Do you need assistance with getting out of bed/getting out of a chair/moving?: No (family and private duty caregiver supervise all self-care activities/ mobility: assists as indicated; patient uses walker "all the time") Do you have difficulty managing or taking your medications?: Yes  Follow up appointments reviewed: PCP Follow-up appointment confirmed?: Yes Date of PCP follow-up appointment?: 02/05/24 Follow-up Provider: PCP- Dr. Alvy Bimler Specialist [facility] Follow-up appointment confirmed?: No Reason Specialist Follow-Up Not Confirmed: Patient has Specialist Provider Number and will Call for Appointment Do you need transportation to your follow-up appointment?: No Do you understand care options if your condition(s) worsen?: Yes-patient verbalized understanding  SDOH Interventions Today    Flowsheet Row Most Recent Value  SDOH Interventions   Food Insecurity Interventions Intervention Not Indicated  Housing Interventions Intervention Not Indicated  Transportation Interventions Intervention Not Indicated  [family or private duty caregiver provides all transportation]  Utilities Interventions Intervention Not Indicated      Interventions Today    Flowsheet Row Most Recent Value  Chronic Disease   Chronic disease during today's visit Chronic Kidney Disease/End Stage Renal Disease (ESRD)  General Interventions   General Interventions Discussed/Reviewed General Interventions Discussed, Doctor Visits, Community Resources, Horticulturist, commercial (DME)  Doctor Visits Discussed/Reviewed Doctor Visits Discussed, PCP, Specialist  Durable Medical Equipment (DME) Val Riles uses assistive devices on regular basis, at baseline -- walker]  PCP/Specialist Visits Compliance with follow-up visit  Exercise Interventions   Exercise Discussed/Reviewed Exercise Discussed  [provided education around  around possible benefit of home health or outpatient PT,  how to obtain  referral/ order for same if caregiver thinks this would be beneficial]  Education Interventions   Education Provided Provided Education  Provided Verbal Education On [employer]  [provided education around around possible benefit of LCSW referral in future to explore additional in-home care assistance resources vs. facility placement,  how to obtain referral/ order- declines LCSW today, says she has already explored resources]  Nutrition Interventions   Nutrition Discussed/Reviewed Nutrition Discussed  Pharmacy Interventions   Pharmacy Dicussed/Reviewed Pharmacy Topics Discussed  [Full medication review with updating medication list in EHR per patient report]  Safety Interventions   Safety Discussed/Reviewed Safety Discussed, Fall Risk  [provided education/ reinforcement around fall prevention]      TOC Interventions Today    Flowsheet Row Most Recent Value  TOC Interventions   TOC Interventions Discussed/Reviewed TOC Interventions Discussed  [Caregiver declines need for ongoing/ further care management outreach,  declines enrollment in 30-day TOC program,  provided my direct contact information should questions/ concerns/ needs arise post-TOC call]      Total time spent from review to signing of note/ including any care coordination interventions:  65 minutes  Pls call/ message for questions,  Caryl Pina, RN, BSN, Media planner  Transitions of Care  [facility] [phone]: direct office

## 2024-01-28 DIAGNOSIS — Z992 Dependence on renal dialysis: Secondary | ICD-10-CM | POA: Diagnosis not present

## 2024-01-28 DIAGNOSIS — E1122 Type 2 diabetes mellitus with diabetic chronic kidney disease: Secondary | ICD-10-CM | POA: Diagnosis not present

## 2024-01-28 DIAGNOSIS — N2581 Secondary hyperparathyroidism of renal origin: Secondary | ICD-10-CM | POA: Diagnosis not present

## 2024-01-28 DIAGNOSIS — N186 End stage renal disease: Secondary | ICD-10-CM | POA: Diagnosis not present

## 2024-01-30 DIAGNOSIS — Z992 Dependence on renal dialysis: Secondary | ICD-10-CM | POA: Diagnosis not present

## 2024-01-30 DIAGNOSIS — N186 End stage renal disease: Secondary | ICD-10-CM | POA: Diagnosis not present

## 2024-01-30 DIAGNOSIS — N2581 Secondary hyperparathyroidism of renal origin: Secondary | ICD-10-CM | POA: Diagnosis not present

## 2024-02-04 DIAGNOSIS — Z992 Dependence on renal dialysis: Secondary | ICD-10-CM | POA: Diagnosis not present

## 2024-02-04 DIAGNOSIS — N186 End stage renal disease: Secondary | ICD-10-CM | POA: Diagnosis not present

## 2024-02-04 DIAGNOSIS — D631 Anemia in chronic kidney disease: Secondary | ICD-10-CM | POA: Diagnosis not present

## 2024-02-04 DIAGNOSIS — N2581 Secondary hyperparathyroidism of renal origin: Secondary | ICD-10-CM | POA: Diagnosis not present

## 2024-02-05 ENCOUNTER — Encounter: Payer: Self-pay | Admitting: Emergency Medicine

## 2024-02-05 ENCOUNTER — Ambulatory Visit (INDEPENDENT_AMBULATORY_CARE_PROVIDER_SITE_OTHER): Admitting: Emergency Medicine

## 2024-02-05 VITALS — BP 126/58 | Temp 97.8°F | Ht 64.0 in | Wt 131.0 lb

## 2024-02-05 DIAGNOSIS — Z09 Encounter for follow-up examination after completed treatment for conditions other than malignant neoplasm: Secondary | ICD-10-CM

## 2024-02-05 DIAGNOSIS — E113413 Type 2 diabetes mellitus with severe nonproliferative diabetic retinopathy with macular edema, bilateral: Secondary | ICD-10-CM | POA: Diagnosis not present

## 2024-02-05 DIAGNOSIS — N186 End stage renal disease: Secondary | ICD-10-CM | POA: Diagnosis not present

## 2024-02-05 DIAGNOSIS — N10 Acute tubulo-interstitial nephritis: Secondary | ICD-10-CM | POA: Diagnosis not present

## 2024-02-05 DIAGNOSIS — Z794 Long term (current) use of insulin: Secondary | ICD-10-CM

## 2024-02-05 DIAGNOSIS — Z992 Dependence on renal dialysis: Secondary | ICD-10-CM | POA: Diagnosis not present

## 2024-02-05 DIAGNOSIS — I10 Essential (primary) hypertension: Secondary | ICD-10-CM

## 2024-02-05 DIAGNOSIS — I5042 Chronic combined systolic (congestive) and diastolic (congestive) heart failure: Secondary | ICD-10-CM

## 2024-02-05 DIAGNOSIS — E1122 Type 2 diabetes mellitus with diabetic chronic kidney disease: Secondary | ICD-10-CM | POA: Diagnosis not present

## 2024-02-05 DIAGNOSIS — E039 Hypothyroidism, unspecified: Secondary | ICD-10-CM

## 2024-02-05 DIAGNOSIS — I2510 Atherosclerotic heart disease of native coronary artery without angina pectoris: Secondary | ICD-10-CM | POA: Diagnosis not present

## 2024-02-05 NOTE — Assessment & Plan Note (Signed)
 BP Readings from Last 3 Encounters:  02/05/24 (!) 126/58  01/23/24 (!) 145/68  11/29/23 (!) 180/84  Well-controlled hypertension off medications Was at some point advised to take clonidine 0.1 mg as needed

## 2024-02-05 NOTE — Assessment & Plan Note (Signed)
Clinically euthyroid.  Continue Synthroid 88 mcg daily.

## 2024-02-05 NOTE — Assessment & Plan Note (Signed)
 With history of stents in the past Sees cardiologist on a regular basis. Continues Eliquis 2.5 mg twice a day and metoprolol tartrate 12.5 mg daily as needed

## 2024-02-05 NOTE — Assessment & Plan Note (Signed)
 Clinically normovolemic Stable no concerns

## 2024-02-05 NOTE — Assessment & Plan Note (Signed)
 On dialysis 3 times a week Clinically stable

## 2024-02-05 NOTE — Assessment & Plan Note (Signed)
 Presently on insulin pump Recently establish care with endocrinologist Clinically stable Dialysis 3 times a week Normovolemic at present time.  No complications.

## 2024-02-05 NOTE — Progress Notes (Signed)
 Elizabeth Olsen 83 y.o.   Chief Complaint  Patient presents with   Hospitalization Follow-up    Patient here for HFU. Patient wants PT she is having balance and leg strength issues here lately. Pts daughter mentions she's been having frequent UTI.     HISTORY OF PRESENT ILLNESS: This is a 83 y.o. female here for follow-up of recent hospital discharge Accompanied today by daughter.  Overall doing well. Has no complaints or any other medical concerns. Wt Readings from Last 3 Encounters:  02/05/24 131 lb (59.4 kg)  01/23/24 135 lb 5.8 oz (61.4 kg)  10/16/23 134 lb 3.2 oz (60.9 kg)   BP Readings from Last 3 Encounters:  02/05/24 (!) 126/58  01/23/24 (!) 145/68  11/29/23 (!) 180/84   Hospital discharge summary as follows: Physician Discharge Summary    Patient: Elizabeth Olsen MRN: [id number] DOB: 03-Dec-1940  Admit date:     01/20/2024  Discharge date: 01/23/24  Discharge Physician: Jacquelin Hawking, MD    PCP: Georgina Quint, MD    Recommendations at discharge:  PCP visit for hospital follow-up Nephrology follow-up   Discharge Diagnoses: Principal Problem:   Acute pyelonephritis Active Problems:   Type 1 diabetes (HCC)   Atrial fibrillation with RVR (HCC)   Hemodialysis patient (HCC)   Volume overload   Resolved Problems:   * No resolved hospital problems. *   Hospital Course: Elizabeth Olsen is a 84 y.o. female with a history of ESRD on hemodialysis, chronic diastolic heart failure, CAD status post PCI, diabetes mellitus type 2 on insulin pump, hyperlipidemia, paroxysmal atrial fibrillation on anticoagulation, breast cancer, recurrent UTI with history of ESBL E. coli.  Patient presented secondary to shortness of breath, nausea, abdominal pain was found to have evidence of urinary tract infection with associated heart failure exacerbation requiring fluid management via hemodialysis.  Patient started empirically on meropenem for history of ESBL E. coli infection.  Urine  culture significant for Klebsiella pneumoniae and patient was transition to Keflex.  Regarding fluid status, patient manage successfully with hemodialysis with ability to wean off oxygen..   Assessment and Plan:   UTI Patient with history of ESBL E. coli UTI in the past.  Patient was initially managed empirically with meropenem IV.  Urine culture significant for Klebsiella pneumoniae infection with resistance only to ampicillin.  Patient transitioned to Keflex renally dosed, to complete a 7-day course of antibiotics.   Acute respiratory failure with hypoxia Secondary to acute heart failure. Patient requiring up to 4 L/min of supplemental oxygen. Weaned to room air.   Acute on chronic diastolic heart failure History of elevated BNP over 1212 with mild pulmonary edema and cardiomegaly noted on chest x-ray.  CT abdomen/pelvis was significant for pleural effusion.  Patient's fluid status was managed via hemodialysis with improvement of symptoms.   Demand ischemia Patient with elevated troponin of 130 > 133 > 149 in setting of likely demand ischemia from UTI and acute heart failure and complicated by ESRD status.  No chest pain.  No acute ST-T segment changes.   ESRD on HD Nephrology consulted.  Patient managed via hemodialysis while admitted.  Continue hemodialysis as an outpatient.   Paroxysmal atrial fibrillation Noted on admission.  Patient now in sinus rhythm.  Continue Eliquis and metoprolol.   Hypothyroidism Continue levothyroxine   Diabetes mellitus type 1 Continue insulin pump   CAD S/p PCI history.   Right pleural effusion Noted on CT imaging.  No intervention performed.  Respiratory status improved  with hemodialysis and fluid management.      HPI   Prior to Admission medications   Medication Sig Start Date End Date Taking? Authorizing Provider  cloNIDine (CATAPRES) 0.1 MG tablet Take 1 tablet (0.1 mg total) by mouth daily as needed (FOR SYSTOLIC BLOOD PRESSURE GREATER  THAN 170 AND OR DIASTOLIC FOR BLOOD PRESSURE GREATER 100). 05/18/23  Yes Bender, Emily, DO  Insulin Disposable Pump (OMNIPOD 5 [id number] PODS GEN 5) MISC Inject into the skin. 10/28/23  Yes [provider]  insulin lispro (HUMALOG) 100 UNIT/ML injection USE AS DIRECTED VIA INSULIN PUMP. MAX DAILY DOSAGE IS 50 UNITS 01/10/24  Yes [provider]  levothyroxine (SYNTHROID) 88 MCG tablet Take 1 tablet (88 mcg total) by mouth daily before breakfast. 12/19/23 12/18/24 Yes Mishti Swanton, [name], MD  VELPHORO 500 MG chewable tablet Chew 500 mg by mouth 5 (five) times daily as needed. 09/11/23  Yes [provider]  White Petrolatum-Mineral Oil (LUBRICANT EYE NIGHTTIME) OINT Apply 1 Application to eye at bedtime.   Yes [provider]  zinc gluconate 50 MG tablet Take 50 mg by mouth every evening.   Yes [provider]  acetaminophen (TYLENOL) 325 MG tablet Take by mouth every 6 (six) hours as needed for headache, fever or mild pain (pain score 1-3). Patient not taking: Reported on 02/05/2024    [provider]  apixaban (ELIQUIS) 2.5 MG TABS tablet Take 1 tablet (2.5 mg total) by mouth 2 (two) times daily. Patient not taking: Reported on 01/25/2024 12/27/23   Georgeanna Lea, MD  ascorbic Acid (VITAMIN C) 500 MG CPCR Take 500 mg by mouth every evening. Patient not taking: Reported on 02/05/2024    [provider]  Glucagon (BAQSIMI ONE PACK) 3 MG/DOSE POWD As needed for severe hypoglycemia Patient not taking: Reported on 02/05/2024 01/10/24   [provider]  insulin glargine (LANTUS SOLOSTAR) 100 UNIT/ML Solostar Pen Inject into the skin as needed. Patient not taking: Reported on 02/05/2024 01/10/24   [provider]  lidocaine-prilocaine (EMLA) cream Apply topically once. Patient not taking: Reported on 02/05/2024 10/16/23   [provider]  Menthol-Zinc Oxide (BODY POWDER MEDICATED EX) Apply 1 application  topically daily. Apply  under breast and stomach Patient not taking: Reported on 02/05/2024    [provider]  metoprolol tartrate (LOPRESSOR) 25 MG tablet Take 12.5 mg by mouth daily as needed (IF HR more than 100 and syst BP more than 160 per daughter). Patient not taking: Reported on 02/05/2024    [provider]    Allergies  Allergen Reactions   Erythromycin Shortness Of Breath and Swelling    "throat swelling", sob, thrashing ,    Penicillins Rash   Tape Other (See Comments)    Patient states that she prefers paper tape because other tapes tear skin.   Dextromethorphan Rash   Doxylamine-Dm Rash   Nyquil [Pseudoeph-Doxylamine-Dm-Apap] Rash   Sulfa Antibiotics Rash    Patient Active Problem List   Diagnosis Date Noted   Volume overload 01/21/2024   Acute pyelonephritis 01/20/2024   Hemodialysis patient (HCC) 01/20/2024   Moderate protein-calorie malnutrition (HCC) 06/28/2023   Constipation 06/12/2023   Type 2 diabetes mellitus with chronic kidney disease on chronic dialysis, without long-term current use of insulin (HCC) 06/11/2023   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   Iron deficiency anemia, unspecified 05/08/2023   Atherosclerotic heart disease of native coronary artery without angina pectoris 05/08/2023   Allergy, unspecified, initial encounter 05/08/2023  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   Atrial fibrillation with RVR (HCC) 05/05/2023   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) 02/14/2021   ESRD (end stage renal disease) (HCC) 09/21/2020   Congestive heart failure (CHF) (HCC) 04/12/2020   Vitamin D deficiency 10/06/2019   Essential hypertension 09/11/2018   Vertigo 06/24/2018   Anemia of chronic disease 12/10/2017   Acquired hypothyroidism 10/13/2017   Type 1 diabetes (HCC) 07/23/2017   Hyperlipidemia 07/23/2017   Iron deficiency 07/23/2017   Cardiomyopathy (HCC) 07/23/2017    Past Medical History:  Diagnosis Date   A-fib (HCC)     Acquired hypothyroidism 10/13/2017   Acute diastolic CHF (congestive heart failure) (HCC) 11/20/2019   Acute respiratory failure with hypoxia (HCC) 10/13/2017   Acute systolic heart failure (HCC) 10/13/2017   Anemia    Anemia associated with diabetes mellitus (HCC) 10/13/2017   Anemia of chronic disease 12/10/2017   Blood transfusion    Blood transfusion without reported diagnosis    with breast surgery   Breast cancer (HCC) 15 years ago   left    Cardiomyopathy (HCC) 07/23/2017   Ejection fraction 45-50% may; from September 2018   CHF (congestive heart failure) (HCC)    Chronic cholecystitis with calculus 01/22/2018   Colon polyp 03/2007   adenomatous   COVID-19 virus infection 11/20/2019   Diabetes mellitus    dx 1998.  was told prior to getting chemo that her bld sugar rose.  She thought it would go back   Dyspnea    d/t anemia   ESRD (end stage renal disease) on dialysis (HCC)    HD MWF   Essential hypertension 09/11/2018   GERD (gastroesophageal reflux disease)    Heart murmur    as child   Hernia, incisional    Hyperlipidemia    Hypertension    Hypothyroidism    Iron deficiency 07/23/2017   Near syncope 04/01/2018   Neuropathy    Osteoarthritis, hip, bilateral    PONV (postoperative nausea and vomiting)    Refractory anemia, unspecified (HCC) 12/10/2017   Short-term memory loss    per pt's daughter   Type 1 diabetes (HCC) 07/23/2017   Vertigo 06/24/2018   Vitamin D deficiency     Past Surgical History:  Procedure Laterality Date   A/V FISTULAGRAM N/A 05/16/2023   Procedure: A/V Fistulagram;  Surgeon: Victorino Sparrow, MD;  Location[facility] INVASIVE CV LAB;  Service: Cardiovascular;  Laterality: N/A;   ABDOMINAL HYSTERECTOMY     APPENDECTOMY     BREAST SURGERY     CAPD REMOVAL N/A 05/31/2023   Procedure: REMOVAL CONTINUOUS AMBULATORY PERITONEAL DIALYSIS  (CAPD) CATHETER;  Surgeon: [name], MD;  Location: [facility] OR;  Service: Vascular;  Laterality: N/A;    CHOLECYSTECTOMY  01/22/2018   LAPROSCOPIC    CHOLECYSTECTOMY N/A 01/22/2018   Procedure: LAPAROSCOPIC CHOLECYSTECTOMY WITH INTRAOPERATIVE CHOLANGIOGRAM;  Surgeon: Manus Rudd, MD;  Location: [facility] OR;  Service: General;  Laterality: N/A;   COLONOSCOPY  2013   due next 03-2017   EYE SURGERY     bil cataracts   IR THROMBECTOMY AV FISTULA W/THROMBOLYSIS/PTA INC/SHUNT/IMG RIGHT Right 09/11/2023   IR US GUIDE VASC ACCESS RIGHT  09/11/2023   IUD REMOVAL     with appendectomy   MASTECTOMY, MODIFIED RADICAL W/RECONSTRUCTION Left 1998   10 nodes out   PERIPHERAL VASCULAR INTERVENTION  05/16/2023   Procedure: PERIPHERAL VASCULAR INTERVENTION;  Surgeon: Victorino Sparrow, MD;  Location: [facility] INVASIVE CV LAB;  Service: Cardiovascular;;  THORACENTESIS Right 05/17/2023   Procedure: THORACENTESIS;  Surgeon: Lorin Glass, MD;  Location: [facility] ENDOSCOPY;  Service: Pulmonary;  Laterality: Right;   TUMOR EXCISION Left    x 2, neck, head    Social History   Socioeconomic History   Marital status: Widowed    Spouse name: Not on file   Number of children: 2   Years of education: Not on file   Highest education level: Not on file  Occupational History   Occupation: homemaker  Tobacco Use   Smoking status: Never   Smokeless tobacco: Never  Vaping Use   Vaping status: Never Used  Substance and Sexual Activity   Alcohol use: No   Drug use: No   Sexual activity: Not Currently  Other Topics Concern   Not on file  Social History Narrative   1 son is deceased. Husband is deceased   Social Drivers of Corporate investment banker Strain: Not on file  Food Insecurity: No Food Insecurity (01/25/2024)   Hunger Vital Sign    Worried About Running Out of Food in the Last Year: Never true    Ran Out of Food in the Last Year: Never true  Transportation Needs: No Transportation Needs (01/25/2024)   PRAPARE - Administrator, Civil Service (Medical): No    Lack of Transportation (Non-Medical): No   Physical Activity: Not on file  Stress: Not on file  Social Connections: Socially Isolated (01/21/2024)   Social Connection and Isolation Panel [NHANES]    Frequency of Communication with Friends and Family: More than three times a week    Frequency of Social Gatherings with Friends and Family: More than three times a week    Attends Religious Services: Never    Database administrator or Organizations: No    Attends Banker Meetings: Never    Marital Status: Widowed  Intimate Partner Violence: Patient Unable To Answer (01/25/2024)   Humiliation, Afraid, Rape, and Kick questionnaire    Fear of Current or Ex-Partner: Patient unable to answer    Emotionally Abused: Patient unable to answer    Physically Abused: Patient unable to answer    Sexually Abused: Patient unable to answer    Family History  Problem Relation Age of Onset   Diabetes Other        both sides of family   Hypertension Father    Congestive Heart Failure Father    Peripheral vascular disease Father    Hypertension Maternal Grandfather    Hypertension Maternal Grandmother    Stomach cancer Maternal Grandmother        GGM   Heart attack Brother    Colon cancer Neg Hx    Esophageal cancer Neg Hx    Pancreatic cancer Neg Hx    Rectal cancer Neg Hx      Review of Systems  Constitutional: Negative.  Negative for chills and fever.  HENT: Negative.  Negative for congestion and sore throat.   Respiratory: Negative.  Negative for cough and shortness of breath.   Cardiovascular: Negative.  Negative for chest pain and palpitations.  Gastrointestinal:  Negative for abdominal pain, diarrhea, nausea and vomiting.  Genitourinary: Negative.   Skin: Negative.  Negative for rash.  Neurological: Negative.  Negative for dizziness and headaches.  All other systems reviewed and are negative.   Vitals:   02/05/24 1336  BP: (!) 126/58  Temp: 97.8 F (36.6 C)    Physical Exam Vitals reviewed.   Constitutional:  Appearance: Normal appearance.  HENT:     Head: Normocephalic.     Mouth/Throat:     Mouth: Mucous membranes are moist.     Pharynx: Oropharynx is clear.  Eyes:     Extraocular Movements: Extraocular movements intact.     Conjunctiva/sclera: Conjunctivae normal.     Pupils: Pupils are equal, round, and reactive to light.  Cardiovascular:     Rate and Rhythm: Normal rate and regular rhythm.     Pulses: Normal pulses.     Heart sounds: Normal heart sounds.  Pulmonary:     Effort: Pulmonary effort is normal.     Breath sounds: Normal breath sounds.  Abdominal:     Palpations: Abdomen is soft.     Tenderness: There is no abdominal tenderness.  Musculoskeletal:     Cervical back: No tenderness.  Lymphadenopathy:     Cervical: No cervical adenopathy.  Skin:    General: Skin is warm and dry.     Capillary Refill: Capillary refill takes less than 2 seconds.  Neurological:     General: No focal deficit present.     Mental Status: She is alert and oriented to person, place, and time.  Psychiatric:        Mood and Affect: Mood normal.        Behavior: Behavior normal.      ASSESSMENT & PLAN: A total of 46 minutes was spent with the patient and counseling/coordination of care regarding preparing for this visit, review of most recent office visit notes, review of most recent hospital discharge summary, review of multiple chronic medical conditions under management, review of all medications, review of most recent blood work results, education on nutrition, review of health maintenance items, prognosis, documentation and need for follow-up.  Problem List Items Addressed This Visit       Cardiovascular and Mediastinum   Essential hypertension   BP Readings from Last 3 Encounters:  02/05/24 (!) 126/58  01/23/24 (!) 145/68  11/29/23 (!) 180/84  Well-controlled hypertension off medications Was at some point advised to take clonidine 0.1 mg as needed          Congestive heart failure (CHF) (HCC)   Clinically normovolemic Stable no concerns      Atherosclerotic heart disease of native coronary artery without angina pectoris - Primary   With history of stents in the past Sees cardiologist on a regular basis. Continues Eliquis 2.5 mg twice a day and metoprolol tartrate 12.5 mg daily as needed        Endocrine   Acquired hypothyroidism   Clinically euthyroid Continue Synthroid 88 mcg daily      Type 2 diabetes mellitus with chronic kidney disease on chronic dialysis, without long-term current use of insulin (HCC)   Presently on insulin pump Recently establish care with endocrinologist Clinically stable Dialysis 3 times a week Normovolemic at present time.  No complications.        Genitourinary   ESRD (end stage renal disease) (HCC)   On dialysis 3 times a week Clinically stable      Acute pyelonephritis   Clinically stable.  Much improved.  Afebrile. Asymptomatic Finished course of antibiotics      Other Visit Diagnoses       Hospital discharge follow-up          Patient Instructions  Health Maintenance After Age 24 After age 16, you are at a higher risk for certain long-term diseases and infections as well as injuries from falls. Falls  are a major cause of broken bones and head injuries in people who are older than age 80. Getting regular preventive care can help to keep you healthy and well. Preventive care includes getting regular testing and making lifestyle changes as recommended by your health care provider. Talk with your health care provider about: Which screenings and tests you should have. A screening is a test that checks for a disease when you have no symptoms. A diet and exercise plan that is right for you. What should I know about screenings and tests to prevent falls? Screening and testing are the best ways to find a health problem early. Early diagnosis and treatment give you the best chance of managing  medical conditions that are common after age 41. Certain conditions and lifestyle choices may make you more likely to have a fall. Your health care provider may recommend: Regular vision checks. Poor vision and conditions such as cataracts can make you more likely to have a fall. If you wear glasses, make sure to get your prescription updated if your vision changes. Medicine review. Work with your health care provider to regularly review all of the medicines you are taking, including over-the-counter medicines. Ask your health care provider about any side effects that may make you more likely to have a fall. Tell your health care provider if any medicines that you take make you feel dizzy or sleepy. Strength and balance checks. Your health care provider may recommend certain tests to check your strength and balance while standing, walking, or changing positions. Foot health exam. Foot pain and numbness, as well as not wearing proper footwear, can make you more likely to have a fall. Screenings, including: Osteoporosis screening. Osteoporosis is a condition that causes the bones to get weaker and break more easily. Blood pressure screening. Blood pressure changes and medicines to control blood pressure can make you feel dizzy. Depression screening. You may be more likely to have a fall if you have a fear of falling, feel depressed, or feel unable to do activities that you used to do. Alcohol use screening. Using too much alcohol can affect your balance and may make you more likely to have a fall. Follow these instructions at home: Lifestyle Do not drink alcohol if: Your health care provider tells you not to drink. If you drink alcohol: Limit how much you have to: 0-1 drink a day for women. 0-2 drinks a day for men. Know how much alcohol is in your drink. In the [place] one drink equals one 12 oz bottle of beer (355 mL), one 5 oz glass of wine (148 mL), or one 1 oz glass of hard liquor (44 mL). Do  not use any products that contain nicotine or tobacco. These products include cigarettes, chewing tobacco, and vaping devices, such as e-cigarettes. If you need help quitting, ask your health care provider. Activity  Follow a regular exercise program to stay fit. This will help you maintain your balance. Ask your health care provider what types of exercise are appropriate for you. If you need a cane or walker, use it as recommended by your health care provider. Wear supportive shoes that have nonskid soles. Safety  Remove any tripping hazards, such as rugs, cords, and clutter. Install safety equipment such as grab bars in bathrooms and safety rails on stairs. Keep rooms and walkways well-lit. General instructions Talk with your health care provider about your risks for falling. Tell your health care provider if: You fall. Be sure to tell  your health care provider about all falls, even ones that seem minor. You feel dizzy, tiredness (fatigue), or off-balance. Take over-the-counter and prescription medicines only as told by your health care provider. These include supplements. Eat a healthy diet and maintain a healthy weight. A healthy diet includes low-fat dairy products, low-fat (lean) meats, and fiber from whole grains, beans, and lots of fruits and vegetables. Stay current with your vaccines. Schedule regular health, dental, and eye exams. Summary Having a healthy lifestyle and getting preventive care can help to protect your health and wellness after age 57. Screening and testing are the best way to find a health problem early and help you avoid having a fall. Early diagnosis and treatment give you the best chance for managing medical conditions that are more common for people who are older than age 42. Falls are a major cause of broken bones and head injuries in people who are older than age 9. Take precautions to prevent a fall at home. Work with your health care provider to learn what  changes you can make to improve your health and wellness and to prevent falls. This information is not intended to replace advice given to you by your health care provider. Make sure you discuss any questions you have with your health care provider. Document Revised: 03/07/2021 Document Reviewed: 03/07/2021 Elsevier Patient Education  2024 Elsevier Inc.    Edwina Barth, MD [facility]

## 2024-02-05 NOTE — Assessment & Plan Note (Signed)
 Clinically stable.  Much improved.  Afebrile. Asymptomatic Finished course of antibiotics

## 2024-02-05 NOTE — Patient Instructions (Signed)
 Health Maintenance After Age 82 After age 4, you are at a higher risk for certain long-term diseases and infections as well as injuries from falls. Falls are a major cause of broken bones and head injuries in people who are older than age 47. Getting regular preventive care can help to keep you healthy and well. Preventive care includes getting regular testing and making lifestyle changes as recommended by your health care provider. Talk with your health care provider about: Which screenings and tests you should have. A screening is a test that checks for a disease when you have no symptoms. A diet and exercise plan that is right for you. What should I know about screenings and tests to prevent falls? Screening and testing are the best ways to find a health problem early. Early diagnosis and treatment give you the best chance of managing medical conditions that are common after age 37. Certain conditions and lifestyle choices may make you more likely to have a fall. Your health care provider may recommend: Regular vision checks. Poor vision and conditions such as cataracts can make you more likely to have a fall. If you wear glasses, make sure to get your prescription updated if your vision changes. Medicine review. Work with your health care provider to regularly review all of the medicines you are taking, including over-the-counter medicines. Ask your health care provider about any side effects that may make you more likely to have a fall. Tell your health care provider if any medicines that you take make you feel dizzy or sleepy. Strength and balance checks. Your health care provider may recommend certain tests to check your strength and balance while standing, walking, or changing positions. Foot health exam. Foot pain and numbness, as well as not wearing proper footwear, can make you more likely to have a fall. Screenings, including: Osteoporosis screening. Osteoporosis is a condition that causes  the bones to get weaker and break more easily. Blood pressure screening. Blood pressure changes and medicines to control blood pressure can make you feel dizzy. Depression screening. You may be more likely to have a fall if you have a fear of falling, feel depressed, or feel unable to do activities that you used to do. Alcohol use screening. Using too much alcohol can affect your balance and may make you more likely to have a fall. Follow these instructions at home: Lifestyle Do not drink alcohol if: Your health care provider tells you not to drink. If you drink alcohol: Limit how much you have to: 0-1 drink a day for women. 0-2 drinks a day for men. Know how much alcohol is in your drink. In the [place] one drink equals one 12 oz bottle of beer (355 mL), one 5 oz glass of wine (148 mL), or one 1 oz glass of hard liquor (44 mL). Do not use any products that contain nicotine or tobacco. These products include cigarettes, chewing tobacco, and vaping devices, such as e-cigarettes. If you need help quitting, ask your health care provider. Activity  Follow a regular exercise program to stay fit. This will help you maintain your balance. Ask your health care provider what types of exercise are appropriate for you. If you need a cane or walker, use it as recommended by your health care provider. Wear supportive shoes that have nonskid soles. Safety  Remove any tripping hazards, such as rugs, cords, and clutter. Install safety equipment such as grab bars in bathrooms and safety rails on stairs. Keep rooms and walkways  well-lit. General instructions Talk with your health care provider about your risks for falling. Tell your health care provider if: You fall. Be sure to tell your health care provider about all falls, even ones that seem minor. You feel dizzy, tiredness (fatigue), or off-balance. Take over-the-counter and prescription medicines only as told by your health care provider. These include  supplements. Eat a healthy diet and maintain a healthy weight. A healthy diet includes low-fat dairy products, low-fat (lean) meats, and fiber from whole grains, beans, and lots of fruits and vegetables. Stay current with your vaccines. Schedule regular health, dental, and eye exams. Summary Having a healthy lifestyle and getting preventive care can help to protect your health and wellness after age 11. Screening and testing are the best way to find a health problem early and help you avoid having a fall. Early diagnosis and treatment give you the best chance for managing medical conditions that are more common for people who are older than age 28. Falls are a major cause of broken bones and head injuries in people who are older than age 48. Take precautions to prevent a fall at home. Work with your health care provider to learn what changes you can make to improve your health and wellness and to prevent falls. This information is not intended to replace advice given to you by your health care provider. Make sure you discuss any questions you have with your health care provider. Document Revised: 03/07/2021 Document Reviewed: 03/07/2021 Elsevier Patient Education  2024 [employer].

## 2024-02-11 DIAGNOSIS — N2581 Secondary hyperparathyroidism of renal origin: Secondary | ICD-10-CM | POA: Diagnosis not present

## 2024-02-11 DIAGNOSIS — N186 End stage renal disease: Secondary | ICD-10-CM | POA: Diagnosis not present

## 2024-02-11 DIAGNOSIS — Z992 Dependence on renal dialysis: Secondary | ICD-10-CM | POA: Diagnosis not present

## 2024-02-13 DIAGNOSIS — Z992 Dependence on renal dialysis: Secondary | ICD-10-CM | POA: Diagnosis not present

## 2024-02-13 DIAGNOSIS — N186 End stage renal disease: Secondary | ICD-10-CM | POA: Diagnosis not present

## 2024-02-13 DIAGNOSIS — N2581 Secondary hyperparathyroidism of renal origin: Secondary | ICD-10-CM | POA: Diagnosis not present

## 2024-02-13 DIAGNOSIS — E1065 Type 1 diabetes mellitus with hyperglycemia: Secondary | ICD-10-CM | POA: Diagnosis not present

## 2024-02-18 DIAGNOSIS — Z992 Dependence on renal dialysis: Secondary | ICD-10-CM | POA: Diagnosis not present

## 2024-02-18 DIAGNOSIS — N2581 Secondary hyperparathyroidism of renal origin: Secondary | ICD-10-CM | POA: Diagnosis not present

## 2024-02-18 DIAGNOSIS — N186 End stage renal disease: Secondary | ICD-10-CM | POA: Diagnosis not present

## 2024-02-21 ENCOUNTER — Telehealth: Payer: Self-pay

## 2024-02-21 NOTE — Telephone Encounter (Signed)
 Okay to refer?

## 2024-02-21 NOTE — Telephone Encounter (Signed)
 Copied from CRM [id number]. Topic: Referral - Request for Referral >> Feb 21, 2024  2:25 PM Rosamond Comes wrote: Patient daughter Shelagh Derrick calling asking about the physical therapy referral  Did the patient discuss referral with their provider in the last year? Yes (If No - schedule appointment) (If Yes - send message)  Appointment offered? No  Type of order/referral and detailed reason for visit: Physical Therapy  Preference of office, provider, location: Patient home  If referral order, have you been seen by this specialty before? Yes, in [place]   (If Yes, this issue or another issue? When? Where?  Can we respond through MyChart? No  Shelagh Derrick is also asking for referral to dermatology, patient has moles that are getting larger, more are showing up  Also asking for a mammogram referral and appt.  Shelagh Derrick phone # [phone] ok to leave a detailed message

## 2024-02-22 ENCOUNTER — Other Ambulatory Visit: Payer: Self-pay | Admitting: Radiology

## 2024-02-22 DIAGNOSIS — D229 Melanocytic nevi, unspecified: Secondary | ICD-10-CM

## 2024-02-22 DIAGNOSIS — Z1231 Encounter for screening mammogram for malignant neoplasm of breast: Secondary | ICD-10-CM

## 2024-02-25 DIAGNOSIS — Z992 Dependence on renal dialysis: Secondary | ICD-10-CM | POA: Diagnosis not present

## 2024-02-25 DIAGNOSIS — N186 End stage renal disease: Secondary | ICD-10-CM | POA: Diagnosis not present

## 2024-02-25 DIAGNOSIS — N2581 Secondary hyperparathyroidism of renal origin: Secondary | ICD-10-CM | POA: Diagnosis not present

## 2024-02-25 DIAGNOSIS — E8770 Fluid overload, unspecified: Secondary | ICD-10-CM | POA: Diagnosis not present

## 2024-02-26 ENCOUNTER — Other Ambulatory Visit: Payer: Self-pay | Admitting: Radiology

## 2024-02-26 DIAGNOSIS — R42 Dizziness and giddiness: Secondary | ICD-10-CM

## 2024-02-26 DIAGNOSIS — R531 Weakness: Secondary | ICD-10-CM

## 2024-02-26 NOTE — Telephone Encounter (Signed)
 Referral for Physical therapy and dermatology has been placed

## 2024-02-27 DIAGNOSIS — N186 End stage renal disease: Secondary | ICD-10-CM | POA: Diagnosis not present

## 2024-02-27 DIAGNOSIS — Z992 Dependence on renal dialysis: Secondary | ICD-10-CM | POA: Diagnosis not present

## 2024-02-27 DIAGNOSIS — E1122 Type 2 diabetes mellitus with diabetic chronic kidney disease: Secondary | ICD-10-CM | POA: Diagnosis not present

## 2024-02-28 ENCOUNTER — Other Ambulatory Visit: Payer: Self-pay

## 2024-02-28 ENCOUNTER — Encounter (HOSPITAL_COMMUNITY)
Admission: RE | Disposition: A | Discharge status: Home / Self Care | Payer: Self-pay | Source: Home / Self Care | Attending: Nephrology

## 2024-02-28 ENCOUNTER — Ambulatory Visit (HOSPITAL_COMMUNITY)
Admission: RE | Admit: 2024-02-28 | Discharge: 2024-02-28 | Disposition: A | Discharge status: Home / Self Care | Attending: Nephrology | Admitting: Nephrology

## 2024-02-28 DIAGNOSIS — R2231 Localized swelling, mass and lump, right upper limb: Secondary | ICD-10-CM | POA: Diagnosis not present

## 2024-02-28 DIAGNOSIS — I132 Hypertensive heart and chronic kidney disease with heart failure and with stage 5 chronic kidney disease, or end stage renal disease: Secondary | ICD-10-CM | POA: Insufficient documentation

## 2024-02-28 DIAGNOSIS — I480 Paroxysmal atrial fibrillation: Secondary | ICD-10-CM | POA: Diagnosis not present

## 2024-02-28 DIAGNOSIS — I5042 Chronic combined systolic (congestive) and diastolic (congestive) heart failure: Secondary | ICD-10-CM | POA: Insufficient documentation

## 2024-02-28 DIAGNOSIS — D631 Anemia in chronic kidney disease: Secondary | ICD-10-CM | POA: Diagnosis not present

## 2024-02-28 DIAGNOSIS — Z992 Dependence on renal dialysis: Secondary | ICD-10-CM | POA: Diagnosis not present

## 2024-02-28 DIAGNOSIS — E785 Hyperlipidemia, unspecified: Secondary | ICD-10-CM | POA: Insufficient documentation

## 2024-02-28 DIAGNOSIS — I871 Compression of vein: Secondary | ICD-10-CM | POA: Diagnosis not present

## 2024-02-28 DIAGNOSIS — N186 End stage renal disease: Secondary | ICD-10-CM | POA: Insufficient documentation

## 2024-02-28 DIAGNOSIS — T82898A Other specified complication of vascular prosthetic devices, implants and grafts, initial encounter: Secondary | ICD-10-CM | POA: Insufficient documentation

## 2024-02-28 DIAGNOSIS — E1122 Type 2 diabetes mellitus with diabetic chronic kidney disease: Secondary | ICD-10-CM | POA: Insufficient documentation

## 2024-02-28 DIAGNOSIS — Z853 Personal history of malignant neoplasm of breast: Secondary | ICD-10-CM | POA: Diagnosis not present

## 2024-02-28 DIAGNOSIS — Y832 Surgical operation with anastomosis, bypass or graft as the cause of abnormal reaction of the patient, or of later complication, without mention of misadventure at the time of the procedure: Secondary | ICD-10-CM | POA: Insufficient documentation

## 2024-02-28 DIAGNOSIS — Z794 Long term (current) use of insulin: Secondary | ICD-10-CM | POA: Diagnosis not present

## 2024-02-28 DIAGNOSIS — E039 Hypothyroidism, unspecified: Secondary | ICD-10-CM | POA: Diagnosis not present

## 2024-02-28 SURGERY — A/V SHUNT INTERVENTION
Anesthesia: LOCAL

## 2024-02-28 MED ORDER — FENTANYL CITRATE (PF) 100 MCG/2ML IJ SOLN
INTRAMUSCULAR | Status: DC | PRN
  Administered 2024-02-28: 25 ug via INTRAVENOUS

## 2024-02-28 MED ORDER — MIDAZOLAM HCL 2 MG/2ML IJ SOLN
INTRAMUSCULAR | Status: AC
  Filled 2024-02-28: qty 2

## 2024-02-28 MED ORDER — MIDAZOLAM HCL 2 MG/2ML IJ SOLN
INTRAMUSCULAR | Status: DC | PRN
  Administered 2024-02-28: 1 mg via INTRAVENOUS

## 2024-02-28 MED ORDER — SODIUM CHLORIDE 0.9 % IV SOLN: INTRAVENOUS | Status: DC

## 2024-02-28 MED ORDER — HEPARIN (PORCINE) IN NACL 1000-0.9 UT/500ML-% IV SOLN
INTRAVENOUS | Status: DC | PRN
  Administered 2024-02-28: 500 mL

## 2024-02-28 MED ORDER — LIDOCAINE HCL (PF) 1 % IJ SOLN
INTRAMUSCULAR | Status: DC | PRN
  Administered 2024-02-28: 2 mL via INTRADERMAL

## 2024-02-28 MED ORDER — FENTANYL CITRATE (PF) 100 MCG/2ML IJ SOLN
INTRAMUSCULAR | Status: AC
  Filled 2024-02-28: qty 2

## 2024-02-28 MED ORDER — LIDOCAINE HCL (PF) 1 % IJ SOLN
INTRAMUSCULAR | Status: AC
  Filled 2024-02-28: qty 30

## 2024-02-28 MED ORDER — IODIXANOL 320 MG/ML IV SOLN
INTRAVENOUS | Status: DC | PRN
  Administered 2024-02-28: 16 mL

## 2024-02-28 SURGICAL SUPPLY — 9 items
BAG SNAP BAND KOVER 36X36 (MISCELLANEOUS) ×1 IMPLANT
BALLOON ATHLETIS 12X40X75 (BALLOONS) IMPLANT
BALLOON MUSTANG 12.0X40 75 (BALLOONS) IMPLANT
COVER DOME SNAP 22 D (MISCELLANEOUS) ×1 IMPLANT
GUIDEWIRE ANGLED .035 180CM (WIRE) IMPLANT
KIT MICROPUNCTURE NIT STIFF (SHEATH) IMPLANT
SHEATH PINNACLE R/O II 7F 4CM (SHEATH) IMPLANT
SYR MEDALLION 10ML (SYRINGE) IMPLANT
WIRE TORQFLEX AUST .018X40CM (WIRE) IMPLANT

## 2024-02-28 NOTE — H&P (Signed)
 Elizabeth Olsen is an 83 y.o. female.   Chief Complaint: Right arm swelling, prolonged cannulation site bleeding HPI:  83 year old woman with past medical history significant for left-sided breast cancer status posttreatment, history of congestive heart failure with preserved ejection fraction, hypertension, dyslipidemia, hypothyroidism, paroxysmal atrial fibrillation not on anticoagulation and end-stage renal disease on hemodialysis.  She has a right radiocephalic fistula that was created in 2022 and revised in 2024 for when she transitioned from peritoneal dialysis to hemodialysis.  Her last procedure here was the fistulogram by vascular surgery in July 2024 with subclavian/innominate vein angioplasty.  She denies any chest pain or shortness of breath and has noticed prominent veins around her right upper chest/swelling around her breast.  She denies any chest pain or shortness of breath and does not have any cough, sputum production, wheezing, fever or chills.  Past Medical History:  Diagnosis Date   A-fib (HCC)    Acquired hypothyroidism 10/13/2017   Acute diastolic CHF (congestive heart failure) (HCC) 11/20/2019   Acute respiratory failure with hypoxia (HCC) 10/13/2017   Acute systolic heart failure (HCC) 10/13/2017   Anemia    Anemia associated with diabetes mellitus (HCC) 10/13/2017   Anemia of chronic disease 12/10/2017   Blood transfusion    Blood transfusion without reported diagnosis    with breast surgery   Breast cancer (HCC) 15 years ago   left    Cardiomyopathy (HCC) 07/23/2017   Ejection fraction 45-50% may; from September 2018   CHF (congestive heart failure) (HCC)    Chronic cholecystitis with calculus 01/22/2018   Colon polyp 03/2007   adenomatous   COVID-19 virus infection 11/20/2019   Diabetes mellitus    dx 1998.  was told prior to getting chemo that her bld sugar rose.  She thought it would go back   Dyspnea    d/t anemia   ESRD (end stage renal disease) on  dialysis (HCC)    HD MWF   Essential hypertension 09/11/2018   GERD (gastroesophageal reflux disease)    Heart murmur    as child   Hernia, incisional    Hyperlipidemia    Hypertension    Hypothyroidism    Iron  deficiency 07/23/2017   Near syncope 04/01/2018   Neuropathy    Osteoarthritis, hip, bilateral    PONV (postoperative nausea and vomiting)    Refractory anemia, unspecified (HCC) 12/10/2017   Short-term memory loss    per pt's daughter   Type 1 diabetes (HCC) 07/23/2017   Vertigo 06/24/2018   Vitamin D  deficiency     Past Surgical History:  Procedure Laterality Date   A/V FISTULAGRAM N/A 05/16/2023   Procedure: A/V Fistulagram;  Surgeon: Kayla Part, MD;  Location: [facility] INVASIVE CV LAB;  Service: Cardiovascular;  Laterality: N/A;   ABDOMINAL HYSTERECTOMY     APPENDECTOMY     BREAST SURGERY     CAPD REMOVAL N/A 05/31/2023   Procedure: REMOVAL CONTINUOUS AMBULATORY PERITONEAL DIALYSIS  (CAPD) CATHETER;  Surgeon: Carlene Che, MD;  Location: [facility] OR;  Service: Vascular;  Laterality: N/A;   CHOLECYSTECTOMY  01/22/2018   LAPROSCOPIC    CHOLECYSTECTOMY N/A 01/22/2018   Procedure: LAPAROSCOPIC CHOLECYSTECTOMY WITH INTRAOPERATIVE CHOLANGIOGRAM;  Surgeon: Dareen Ebbing, MD;  Location: [facility] OR;  Service: General;  Laterality: N/A;   COLONOSCOPY  2013   due next 03-2017   EYE SURGERY     bil cataracts   IR THROMBECTOMY AV FISTULA W/THROMBOLYSIS/PTA INC/SHUNT/IMG RIGHT Right 09/11/2023   IR US  GUIDE VASC ACCESS  RIGHT  09/11/2023   IUD REMOVAL     with appendectomy   MASTECTOMY, MODIFIED RADICAL W/RECONSTRUCTION Left 1998   10 nodes out   PERIPHERAL VASCULAR INTERVENTION  05/16/2023   Procedure: PERIPHERAL VASCULAR INTERVENTION;  Surgeon: Kayla Part, MD;  Location: [facility] INVASIVE CV LAB;  Service: Cardiovascular;;   THORACENTESIS Right 05/17/2023   Procedure: THORACENTESIS;  Surgeon: Josiah Nigh, MD;  Location: [facility] ENDOSCOPY;  Service: Pulmonary;  Laterality: Right;    TUMOR EXCISION Left    x 2, neck, head    Family History  Problem Relation Age of Onset   Diabetes Other        both sides of family   Hypertension Father    Congestive Heart Failure Father    Peripheral vascular disease Father    Hypertension Maternal Grandfather    Hypertension Maternal Grandmother    Stomach cancer Maternal Grandmother        GGM   Heart attack Brother    Colon cancer Neg Hx    Esophageal cancer Neg Hx    Pancreatic cancer Neg Hx    Rectal cancer Neg Hx    Social History:  reports that she has never smoked. She has never used smokeless tobacco. She reports that she does not drink alcohol and does not use drugs.  Allergies:  Allergies  Allergen Reactions   Erythromycin Shortness Of Breath and Swelling    "throat swelling", sob, thrashing ,    Penicillins Rash   Tape Other (See Comments)    Patient states that she prefers paper tape because other tapes tear skin.   Dextromethorphan Rash   Doxylamine-Dm Rash   Nyquil [Pseudoeph-Doxylamine-Dm-Apap] Rash   Sulfa Antibiotics Rash    Medications Prior to Admission  Medication Sig Dispense Refill   acetaminophen  (TYLENOL ) 325 MG tablet Take 650 mg by mouth every 6 (six) hours as needed for mild pain (pain score 1-3) (leg).     cloNIDine  (CATAPRES ) 0.1 MG tablet Take 1 tablet (0.1 mg total) by mouth daily as needed (FOR SYSTOLIC BLOOD PRESSURE GREATER THAN 170 AND OR DIASTOLIC FOR BLOOD PRESSURE GREATER 100). 30 tablet 0   Glucagon (BAQSIMI ONE PACK) 3 MG/DOSE POWD      Glycerin-Hypromellose-PEG 400 (DRY EYE RELIEF DROPS) 0.2-0.2-1 % SOLN Place 1 drop into both eyes daily as needed (Dry eyes). Natural tears     Insulin  Disposable Pump (OMNIPOD 5 [id number] PODS GEN 5) MISC Inject into the skin.     insulin  glargine (LANTUS  SOLOSTAR) 100 UNIT/ML Solostar Pen Inject 9 Units into the skin daily as needed.     insulin  lispro (HUMALOG ) 100 UNIT/ML injection See admin instructions. 2 units with each meal, 1 hour  correction every 50 units over 200 give 1 unit     levothyroxine  (SYNTHROID ) 88 MCG tablet Take 1 tablet (88 mcg total) by mouth daily before breakfast. 90 tablet 3   MAGNESIUM PO Take 2,000 mg by mouth daily.     VELPHORO 500 MG chewable tablet Chew 500 mg by mouth 5 (five) times daily as needed.     Vitamin D -Vitamin K (VITAMIN K2-VITAMIN D3 PO) Take 1 tablet by mouth daily. 125 mg /100 mg     apixaban  (ELIQUIS ) 2.5 MG TABS tablet Take 1 tablet (2.5 mg total) by mouth 2 (two) times daily. (Patient not taking: Reported on 01/25/2024) 180 tablet 1    No results found for this or any previous visit (from the past 48 hours). No results found.  Review of Systems  All other systems reviewed and are negative.   Blood pressure (!) 186/71, pulse 74, resp. rate 14, weight 59 kg, SpO2 98%. Physical Exam Vitals reviewed.  Constitutional:      General: She is not in acute distress.    Appearance: Normal appearance. She is normal weight.  HENT:     Head: Normocephalic and atraumatic.     Right Ear: External ear normal.     Left Ear: External ear normal.     Nose: Nose normal.     Mouth/Throat:     Mouth: Mucous membranes are dry.     Pharynx: Oropharynx is clear.  Eyes:     Extraocular Movements: Extraocular movements intact.     Conjunctiva/sclera: Conjunctivae normal.  Cardiovascular:     Rate and Rhythm: Normal rate and regular rhythm.     Pulses: Normal pulses.     Heart sounds: Normal heart sounds.  Pulmonary:     Effort: Pulmonary effort is normal.     Breath sounds: Normal breath sounds.  Musculoskeletal:     Comments: Right radiocephalic fistula that is high up in the forearm.  Aneurysmal cannulation zone that is pulsatile with some oozing from distal cannulation site from yesterday.  Poor outflow thrill.  Neurological:     Mental Status: She is alert.      Assessment/Plan 1.  Decreased access flow/right arm swelling: Will undertake fistulogram today to evaluate for etiology  and offer management with angioplasty as indicated.  Procedure explained to the patient and she consents to proceed after weighing risks and benefits. 2.  End-stage renal disease: Resume hemodialysis on MWF schedule following completion of procedure today. 3.  Hypertension: Elevated blood pressure noted, will monitor with moderate sedation during procedure today. 4.  Anemia: Denies any overt blood loss, resume H/H monitoring and ESA per protocol at outpatient dialysis.  Melodie Spry, MD 02/28/2024, 7:31 AM

## 2024-02-28 NOTE — Op Note (Addendum)
 Patient presents with right arm swelling and prolonged cannulation site bleeding of the right radiocephalic fistula (placed 2022 and revised 2024). Her last procedure here was a 12 mm Athletis subclavian vein/innominate vein angioplasty done in July 2024. On examination, the radiocephalic fistula is hyperpulsatile with an aneurysmal body and a poor thrill in the outflow.  There is a intact scab present proximally in the fistula and a slow ooze from the distal cannulation site.  Augmentation is normal. No signs of impending rupture were seen over the aneurysmal cannulation zone.   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  2)      The body of the radial cephalic vein fistula, outflow cephalic vein/cephalic arch were patent with good flows. 3)      This right radiocephalic fistula remains amenable to future percutaneous intervention.  Description of procedure: The arm was prepped and draped in the usual sterile fashion. The right upper forearm radiocephalic fistula was cannulated (16109) with a 21G micropuncture needle directed in an antegrade direction in the venous limb of the fistula. A guidewire was inserted and exchanged for a 7Fr sheath. Contrast [phone]) injection via the side port of the sheath was performed. 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  Reflux arteriogram not done with rapid flows.  The wire was advanced centrally without any difficulty. An 12 x 40 mm Athletis (advanced from 12 x 40 Mustang) angioplasty balloon was inserted over a glide wire and positioned at the cephalic vein arch stenosis.  Central venous angioplasty (91478) was carried out to 20 ATM with FULL effacement of the waist on the balloon at the site of innominate vein stenosis.  Repeat angiogram showed  <30% residual stenosis at the site of central/innominate vein angioplasty with no evidence of extravasation.  The flow of contrast was quicker and the fistula was markedly less pulsatile.  Hemostasis: A 3-0 ethilon purse string suture was placed at the cannulation site on removal of the sheath.  Sedation: 1 mg Versed , 25 mcg Fentanyl . Sedation time.  12 minutes  Contrast.  16 mL  Monitoring: Because of the patient's comorbid conditions and sedation during the procedure, continuous EKG monitoring and O2 saturation monitoring was performed throughout the procedure by the RN. There were no abnormal arrhythmias encountered.  Complications: None.   Diagnoses: I87.1 Stricture of vein  N18.6 ESRD R22.31 Localized swelling, mass and lump, right upper limb  Procedure Coding:  36901 Cannulation and angiogram of fistula 29562 Cannulation and angiogram of fistula, central vein angioplasty (right innominate vein) Z3086 Contrast  Recommendations:  1. Continue to cannulate the fistula with 15G needles.  2. Refer back for problems with flows. 3. Remove the suture next treatment.   Discharge: The patient was discharged home in stable condition. The patient was given education regarding the care of the dialysis access AVF and specific instructions in case of any problems.

## 2024-02-29 ENCOUNTER — Telehealth: Payer: Self-pay | Admitting: Cardiology

## 2024-02-29 ENCOUNTER — Encounter (HOSPITAL_COMMUNITY): Payer: Self-pay | Admitting: Nephrology

## 2024-02-29 DIAGNOSIS — N2581 Secondary hyperparathyroidism of renal origin: Secondary | ICD-10-CM | POA: Diagnosis not present

## 2024-02-29 DIAGNOSIS — Z992 Dependence on renal dialysis: Secondary | ICD-10-CM | POA: Diagnosis not present

## 2024-02-29 DIAGNOSIS — N186 End stage renal disease: Secondary | ICD-10-CM | POA: Diagnosis not present

## 2024-02-29 NOTE — Telephone Encounter (Signed)
 Left message for the patient to call back.

## 2024-02-29 NOTE — Telephone Encounter (Signed)
 Pt c/o BP issue: STAT if pt c/o blurred vision, one-sided weakness or slurred speech.  1. What is your BP concern?  Daughter says BP has been elevated. Last night it got up to 190/80, sugar was also high, over 300 without eating for about 3-4 hours. O2 was 88-90. She got worked up and had a headache/dizziness. Daughter gave her clonidine  and she calmed down. BP came down and she was no longer having symptoms. Daughter says her lungs are clear and she doesn't think it's due to fluid. She would like to have patient checked for blockages soon. She says patient is currently out at dialysis. Hasn't checked BP today but she's feeling better.  2. Have you taken any BP medication today? Normally doesn't give BP medication on a dialysis day   3. What are your last 5 BP readings? 190/80 last night  4. Are you having any other symptoms (ex. Dizziness, headache, blurred vision, passed out)?  SOB, headaches, dizziness

## 2024-02-29 NOTE — Telephone Encounter (Signed)
 Pts daughter returning nurse call.please advise

## 2024-03-01 DIAGNOSIS — J9601 Acute respiratory failure with hypoxia: Secondary | ICD-10-CM | POA: Diagnosis not present

## 2024-03-01 DIAGNOSIS — I5022 Chronic systolic (congestive) heart failure: Secondary | ICD-10-CM | POA: Diagnosis not present

## 2024-03-01 DIAGNOSIS — E440 Moderate protein-calorie malnutrition: Secondary | ICD-10-CM | POA: Diagnosis not present

## 2024-03-01 DIAGNOSIS — E1065 Type 1 diabetes mellitus with hyperglycemia: Secondary | ICD-10-CM | POA: Diagnosis not present

## 2024-03-01 DIAGNOSIS — D631 Anemia in chronic kidney disease: Secondary | ICD-10-CM | POA: Diagnosis not present

## 2024-03-01 DIAGNOSIS — Z992 Dependence on renal dialysis: Secondary | ICD-10-CM | POA: Diagnosis not present

## 2024-03-01 DIAGNOSIS — I110 Hypertensive heart disease with heart failure: Secondary | ICD-10-CM | POA: Diagnosis not present

## 2024-03-01 DIAGNOSIS — I5033 Acute on chronic diastolic (congestive) heart failure: Secondary | ICD-10-CM | POA: Diagnosis not present

## 2024-03-01 DIAGNOSIS — E1022 Type 1 diabetes mellitus with diabetic chronic kidney disease: Secondary | ICD-10-CM | POA: Diagnosis not present

## 2024-03-01 DIAGNOSIS — N186 End stage renal disease: Secondary | ICD-10-CM | POA: Diagnosis not present

## 2024-03-03 ENCOUNTER — Telehealth: Payer: Self-pay | Admitting: Emergency Medicine

## 2024-03-03 DIAGNOSIS — Z992 Dependence on renal dialysis: Secondary | ICD-10-CM | POA: Diagnosis not present

## 2024-03-03 DIAGNOSIS — N2581 Secondary hyperparathyroidism of renal origin: Secondary | ICD-10-CM | POA: Diagnosis not present

## 2024-03-03 DIAGNOSIS — N186 End stage renal disease: Secondary | ICD-10-CM | POA: Diagnosis not present

## 2024-03-03 NOTE — Telephone Encounter (Signed)
 Copied from CRM [id number]. Topic: Clinical - Home Health Verbal Orders >> Mar 03, 2024  3:57 PM Magdalene School wrote: Caller/Agency: Rene Carrier calling from [facility] Callback Number: [phone] Service Requested: Physical Therapy Frequency: Twice a week for six weeks  Would like to add OT evaluation Any new concerns about the patient? Yes Reaction alert Between clonidine  and metoprolol .

## 2024-03-03 NOTE — Telephone Encounter (Signed)
 Pts daughter requesting a c/b

## 2024-03-03 NOTE — Telephone Encounter (Signed)
 Spoke with daughter Shelagh Derrick. She stated that the pt is starting to have shortness of breath like she did before when she had blockages. It is time for her 6 month follow up . Will send message to front desk to make appt soon for follow up. Daughter agreed to come to [facility] office if needed for appt.

## 2024-03-04 ENCOUNTER — Ambulatory Visit: Admitting: Vascular Surgery

## 2024-03-04 NOTE — Telephone Encounter (Signed)
 Spoke with Rene Carrier calling from [facility] and gave OKAY verbal orders.

## 2024-03-04 NOTE — Telephone Encounter (Signed)
Okay- orders as requested.

## 2024-03-06 NOTE — Telephone Encounter (Signed)
 Spoke with will and gave OKAY for verbal orders

## 2024-03-06 NOTE — Telephone Encounter (Signed)
 Copied from CRM [id number]. Topic: Clinical - Home Health Verbal Orders >> Mar 06, 2024 12:32 PM Albertha Alosa wrote: Caller/Agency: Will Callback Number: [phone] Service Requested: Occupational Therapy Frequency: 2 times a week for 2 week a 1  time a week for 2 week  Any new concerns about the patient? No

## 2024-03-10 DIAGNOSIS — Z992 Dependence on renal dialysis: Secondary | ICD-10-CM | POA: Diagnosis not present

## 2024-03-10 DIAGNOSIS — N2581 Secondary hyperparathyroidism of renal origin: Secondary | ICD-10-CM | POA: Diagnosis not present

## 2024-03-10 DIAGNOSIS — N186 End stage renal disease: Secondary | ICD-10-CM | POA: Diagnosis not present

## 2024-03-10 DIAGNOSIS — D631 Anemia in chronic kidney disease: Secondary | ICD-10-CM | POA: Diagnosis not present

## 2024-03-12 DIAGNOSIS — L821 Other seborrheic keratosis: Secondary | ICD-10-CM | POA: Insufficient documentation

## 2024-03-12 DIAGNOSIS — L603 Nail dystrophy: Secondary | ICD-10-CM

## 2024-03-12 DIAGNOSIS — D2239 Melanocytic nevi of other parts of face: Secondary | ICD-10-CM | POA: Insufficient documentation

## 2024-03-12 DIAGNOSIS — D239 Other benign neoplasm of skin, unspecified: Secondary | ICD-10-CM

## 2024-03-12 DIAGNOSIS — L853 Xerosis cutis: Secondary | ICD-10-CM | POA: Insufficient documentation

## 2024-03-12 HISTORY — DX: Xerosis cutis: L85.3

## 2024-03-12 HISTORY — DX: Nail dystrophy: L60.3

## 2024-03-12 HISTORY — DX: Other benign neoplasm of skin, unspecified: D23.9

## 2024-03-12 HISTORY — DX: Melanocytic nevi of other parts of face: D22.39

## 2024-03-12 HISTORY — DX: Other seborrheic keratosis: L82.1

## 2024-03-13 ENCOUNTER — Ambulatory Visit: Admitting: Cardiology

## 2024-03-14 DIAGNOSIS — R4189 Other symptoms and signs involving cognitive functions and awareness: Secondary | ICD-10-CM | POA: Insufficient documentation

## 2024-03-14 DIAGNOSIS — I480 Paroxysmal atrial fibrillation: Secondary | ICD-10-CM | POA: Insufficient documentation

## 2024-03-14 DIAGNOSIS — K432 Incisional hernia without obstruction or gangrene: Secondary | ICD-10-CM | POA: Insufficient documentation

## 2024-03-14 DIAGNOSIS — E039 Hypothyroidism, unspecified: Secondary | ICD-10-CM | POA: Insufficient documentation

## 2024-03-14 DIAGNOSIS — R011 Cardiac murmur, unspecified: Secondary | ICD-10-CM | POA: Insufficient documentation

## 2024-03-14 DIAGNOSIS — C50919 Malignant neoplasm of unspecified site of unspecified female breast: Secondary | ICD-10-CM | POA: Insufficient documentation

## 2024-03-14 DIAGNOSIS — K219 Gastro-esophageal reflux disease without esophagitis: Secondary | ICD-10-CM | POA: Insufficient documentation

## 2024-03-14 DIAGNOSIS — I4891 Unspecified atrial fibrillation: Secondary | ICD-10-CM | POA: Insufficient documentation

## 2024-03-14 DIAGNOSIS — I10 Essential (primary) hypertension: Secondary | ICD-10-CM | POA: Insufficient documentation

## 2024-03-14 DIAGNOSIS — Z9889 Other specified postprocedural states: Secondary | ICD-10-CM | POA: Insufficient documentation

## 2024-03-14 DIAGNOSIS — G629 Polyneuropathy, unspecified: Secondary | ICD-10-CM | POA: Insufficient documentation

## 2024-03-14 DIAGNOSIS — M160 Bilateral primary osteoarthritis of hip: Secondary | ICD-10-CM | POA: Insufficient documentation

## 2024-03-14 DIAGNOSIS — Z992 Dependence on renal dialysis: Secondary | ICD-10-CM | POA: Insufficient documentation

## 2024-03-14 DIAGNOSIS — Z5189 Encounter for other specified aftercare: Secondary | ICD-10-CM | POA: Insufficient documentation

## 2024-03-14 DIAGNOSIS — R413 Other amnesia: Secondary | ICD-10-CM | POA: Insufficient documentation

## 2024-03-14 DIAGNOSIS — N186 End stage renal disease: Secondary | ICD-10-CM | POA: Insufficient documentation

## 2024-03-17 DIAGNOSIS — N186 End stage renal disease: Secondary | ICD-10-CM | POA: Diagnosis not present

## 2024-03-17 DIAGNOSIS — Z992 Dependence on renal dialysis: Secondary | ICD-10-CM | POA: Diagnosis not present

## 2024-03-17 DIAGNOSIS — N2581 Secondary hyperparathyroidism of renal origin: Secondary | ICD-10-CM | POA: Diagnosis not present

## 2024-03-17 NOTE — Progress Notes (Signed)
 Cardiology Office Note:  .   Date:  03/18/2024  ID:  TAITUM MENTON, DOB [birth year], MRN [id number] PCP: Elvira Hammersmith, MD  [facility] Providers Cardiologist:  Ralene Burger, MD    History of Present Illness: .   TENAE GRAZIOSI is a 83 y.o. female with a past medical history of CAD, atrial fibrillation, cardiomyopathy, hypertension, GERD, hypothyroidism, DM 2, ESRD on HD, multiple myeloma, history of breast cancer.  06/28/2023 echo EF 55-60, grade 3 DD, LA moderately dilated, mild MR, mild to moderate aortic valve regurgitation 05/16/2023 echo EF 50 to 55%, mild concentric LVH, grade 3 DD, severely elevated PASP 69.2 mmHg, mild to moderate MR, moderate calcification of aortic valve with mild aortic regurgitation 2022 PCI with DES x 3 in [place]   She established with Dr. Gordan Latina in 2018 at the behest of her PCP for evaluation after an abnormal echocardiogram.  Most recently evaluated by Dr. Gordan Latina on 08/20/2023, stable from cardiac perspective, no changes were made to her plan of care and she was advised to follow up in 6 months.   She presents today accompanied by her caregiver and her daughter is present on the phone, patient has some slight cognitive impairment so her daughter provides much of the history.  States they have been noticing over the last few weeks that she has been feeling more short of breath.  There is also been more issues with her blood pressure being elevated in the evenings.  She stays very active, tries to take a walk each evening, she is very diligent regarding her dialysis regimen and adheres to fluid restrictions.  The patient actually denies anything is wrong with her however her daughter who she lives with and who is very involved in her care have noticed the subtle changes.  She denies chest pain, palpitations, dyspnea, pnd, orthopnea, n, v, dizziness, syncope, edema, weight gain, or early satiety.   ROS: Review of Systems  Respiratory:  Positive  for shortness of breath.      Studies Reviewed: Aaron Aas   EKG Interpretation Date/Time:  Tuesday Mar 18 2024 14:04:53 EDT Ventricular Rate:  73 PR Interval:  168 QRS Duration:  96 QT Interval:  426 QTC Calculation: 469 R Axis:   122  Text Interpretation: Normal sinus rhythm Right axis deviation Abnormal ECG When compared with ECG of 20-Jan-2024 18:47, No significant change was found Confirmed by Pattricia Bores [phone]) on 03/18/2024 2:09:33 PM    Cardiac Studies & Procedures   ______________________________________________________________________________________________   STRESS TESTS  MYOCARDIAL PERFUSION IMAGING 07/27/2017  Narrative  Nuclear stress EF: 53%.  There was no ST segment deviation noted during stress.  The study is normal.  This is a low risk study.  The left ventricular ejection fraction is mildly decreased (45-54%).  Mild diffuse hypokinesis. No evidence for prior infarct or ischemia.   ECHOCARDIOGRAM  ECHOCARDIOGRAM COMPLETE 06/28/2023  Narrative ECHOCARDIOGRAM REPORT    Patient Name:   MIKEALA GIRDLER [facility] Date of Exam: 06/28/2023 Medical Rec #:  [id number]      Height:       62.0 in Accession #:    [id number]     Weight:       132.0 lb Date of Birth:  [birth year]       BSA:          1.602 m Patient Age:    82 years       BP:           140/63 mmHg Patient  Gender: F              HR:           81 bpm. Exam Location:  [place]  Procedure: 2D Echo, 3D Echo, Cardiac Doppler and Color Doppler  Indications:    I42.9 Cardiomyopathy (unspecified); I42.0 Dilated cardiomyopathy  History:        Patient has prior history of Echocardiogram examinations, most recent 05/16/2023. Cardiomyopathy and CHF, ESRD, Arrythmias:Atrial Fibrillation, Signs/Symptoms:Dyspnea; Risk Factors:Diabetes, Hypertension, Dyslipidemia and Non-Smoker.  Sonographer:    Lyndal Sandy RDMS, RVT, RDCS Referring Phys: [phone] Devorah Fonder KRASOWSKI  IMPRESSIONS   1. Left ventricular ejection  fraction, by estimation, is 55 to 60%. The left ventricle has normal function. The left ventricle has no regional wall motion abnormalities. Left ventricular diastolic parameters are consistent with Grade III diastolic dysfunction (restrictive). 2. Right ventricular systolic function is normal. The right ventricular size is normal. 3. Left atrial size was moderately dilated. 4. The mitral valve is normal in structure. Moderate mitral valve regurgitation. No evidence of mitral stenosis. 5. The aortic valve is normal in structure. Aortic valve regurgitation is mild to moderate. Aortic valve sclerosis is present, with no evidence of aortic valve stenosis. 6. The inferior vena cava is normal in size with greater than 50% respiratory variability, suggesting right atrial pressure of 3 mmHg.  Comparison(s): Echocardiogram done 05/16/23 showed an EF of 50-55%.  FINDINGS Left Ventricle: Left ventricular ejection fraction, by estimation, is 55 to 60%. The left ventricle has normal function. The left ventricle has no regional wall motion abnormalities. The left ventricular internal cavity size was normal in size. There is no left ventricular hypertrophy. Left ventricular diastolic parameters are consistent with Grade III diastolic dysfunction (restrictive).  Right Ventricle: The right ventricular size is normal. No increase in right ventricular wall thickness. Right ventricular systolic function is normal.  Left Atrium: Left atrial size was moderately dilated.  Right Atrium: Right atrial size was normal in size.  Pericardium: There is no evidence of pericardial effusion.  Mitral Valve: The mitral valve is normal in structure. Mild to moderate mitral annular calcification. Moderate mitral valve regurgitation. No evidence of mitral valve stenosis.  Tricuspid Valve: The tricuspid valve is normal in structure. Tricuspid valve regurgitation is mild . No evidence of tricuspid stenosis.  Aortic Valve: The  aortic valve is normal in structure. Aortic valve regurgitation is mild to moderate. Aortic regurgitation PHT measures 439 msec. Aortic valve sclerosis is present, with no evidence of aortic valve stenosis. Aortic valve mean gradient measures 8.0 mmHg. Aortic valve peak gradient measures 14.6 mmHg. Aortic valve area, by VTI measures 1.08 cm.  Pulmonic Valve: The pulmonic valve was normal in structure. Pulmonic valve regurgitation is not visualized. No evidence of pulmonic stenosis.  Aorta: The aortic root is normal in size and structure.  Venous: The inferior vena cava is normal in size with greater than 50% respiratory variability, suggesting right atrial pressure of 3 mmHg.  IAS/Shunts: No atrial level shunt detected by color flow Doppler.   LEFT VENTRICLE PLAX 2D LVIDd:         4.90 cm     Diastology LVIDs:         3.80 cm     LV e' medial:    3.92 cm/s LV PW:         1.20 cm     LV E/e' medial:  34.7 LV IVS:        1.00 cm  LV e' lateral:   5.87 cm/s LVOT diam:     1.70 cm     LV E/e' lateral: 23.2 LV SV:         47 LV SV Index:   30 LVOT Area:     2.27 cm  3D Volume EF: LV Volumes (MOD)           3D EF:        53 % LV vol d, MOD A2C: 83.9 ml LV EDV:       121 ml LV vol d, MOD A4C: 70.0 ml LV ESV:       57 ml LV vol s, MOD A2C: 40.6 ml LV SV:        64 ml LV vol s, MOD A4C: 35.1 ml LV SV MOD A2C:     43.2 ml LV SV MOD A4C:     70.0 ml LV SV MOD BP:      41.5 ml  RIGHT VENTRICLE RV S prime:     9.90 cm/s TAPSE (M-mode): 2.0 cm  LEFT ATRIUM             Index        RIGHT ATRIUM           Index LA diam:        3.90 cm 2.43 cm/m   RA Area:     13.80 cm LA Vol (A2C):   69.2 ml 43.19 ml/m  RA Volume:   35.20 ml  21.97 ml/m LA Vol (A4C):   39.0 ml 24.34 ml/m LA Biplane Vol: 54.4 ml 33.95 ml/m AORTIC VALVE AV Area (Vmax):    1.06 cm AV Area (Vmean):   1.09 cm AV Area (VTI):     1.08 cm AV Vmax:           191.00 cm/s AV Vmean:          137.000 cm/s AV VTI:             0.438 m AV Peak Grad:      14.6 mmHg AV Mean Grad:      8.0 mmHg LVOT Vmax:         89.40 cm/s LVOT Vmean:        66.000 cm/s LVOT VTI:          0.209 m LVOT/AV VTI ratio: 0.48 AI PHT:            439 msec AR Vena Contracta: 0.40 cm  AORTA Ao Root diam: 3.00 cm Ao Asc diam:  3.20 cm  MITRAL VALVE                TRICUSPID VALVE MV Area (PHT): 5.54 cm     TR Peak grad:   31.8 mmHg MV Decel Time: 137 msec     TR Vmax:        282.00 cm/s MR Peak grad: 82.8 mmHg MR Vmax:      455.00 cm/s   SHUNTS MV E velocity: 136.00 cm/s  Systemic VTI:  0.21 m MV A velocity: 76.60 cm/s   Systemic Diam: 1.70 cm MV E/A ratio:  1.78  Hillis Lu MD Electronically signed by Hillis Lu MD Signature Date/Time: 06/28/2023/6:14:49 PM    Final    MONITORS  LONG TERM MONITOR (3-14 DAYS) 04/08/2018  Narrative Barba Levin, DOB October 15, 1941, MRN [id number]  HOLTER MONITOR REPORT:    Date of test:  04/08/2018 Duration of test:           5 days Indication:                    Syncope Ordering physician:  Manfred Seed MD Referring physician:  Manfred Seed MD   Baseline rhythm: Normal sinus rhythm  Minimum heart rate: 60 BPM.  Average heart rate: 76 BPM.  Maximal heart rate 96 BPM.  Atrial arrhythmia: Infrequent premature supraventricular beats with 2 triplets  Ventricular arrhythmia: Total of 98 premature ventricular beats without sustained arrhythmia  Conduction abnormality: None  Symptoms: None   Conclusion: Normal 5 days Holter monitor with doubt significant arrhythmia that would justified her syncope.  Infrequent PVCs and APCs.  No sustained arrhythmia.  Interpreting  cardiologist: Ralene Burger, MD Date: 04/26/2018 10:18 AM       ______________________________________________________________________________________________      Risk Assessment/Calculations:    CHA2DS2-VASc Score = 7   This indicates a 11.2% annual risk of stroke. The  patient's score is based upon: CHF History: 1 HTN History: 1 Diabetes History: 1 Stroke History: 0 Vascular Disease History: 1 Age Score: 2 Gender Score: 1    HYPERTENSION CONTROL Vitals:   03/18/24 1400 03/18/24 1521  BP: (!) 144/60 (!) 144/60    The patient's blood pressure is elevated above target today.  In order to address the patient's elevated BP: Blood pressure will be monitored at home to determine if medication changes need to be made.; A current anti-hypertensive medication was adjusted today.          Physical Exam:   VS:  BP (!) 144/60   Pulse 73   Ht 5\' 4"  (1.626 m)   Wt 129 lb 12.8 oz (58.9 kg)   SpO2 96%   BMI 22.28 kg/m    Wt Readings from Last 3 Encounters:  03/18/24 129 lb 12.8 oz (58.9 kg)  02/28/24 130 lb (59 kg)  02/05/24 131 lb (59.4 kg)    GEN: Appears younger than stated age, well nourished, well developed in no acute distress NECK: No JVD; No carotid bruits CARDIAC: RRR, no murmurs, rubs, gallops RESPIRATORY:  Clear to auscultation without rales, wheezing or rhonchi  ABDOMEN: Soft, non-tender, non-distended EXTREMITIES:  No edema; No deformity   ASSESSMENT AND PLAN: .   CAD-s/p DES x 3, unknown vessel location this occurred in [place]  2022.  Patient denies any actual complaints however her daughter has noticed subtle changes that feel reminiscent leading up to her stent placement.  Will arrange for an ischemic evaluation.  Will start her on low-dose Norvasc  2.5 mg each evening.  Paroxysmal atrial fibrillation/hypercoagulable state-CHA2DS2-VASc or 7, they have self discontinued her Eliquis  secondary to excessive bleeding around her Dexcom site. Will discuss restarting with her daughter who is her proxy.   Dyspnea on exertion-the patient denies this, she does not appear to be having shortness of breath during her office visit today however her daughter has noticed this at times at home.  She does have grade 3 diastolic dysfunction.  Will start  her on low-dose Norvasc  as well as an ischemic evaluation, if she is still feeling bothered by her symptoms then we will arrange for repeat echocardiogram.  Hypertension-her blood pressure is slightly elevated 144/66 however at times they have issues at dialysis due to hypotension.  Will start her on low-dose Norvasc  which might help with her potential cardiac symptoms as well as help with blood pressure support.  She will take this at  night, they will hold if her systolic blood pressure is less than 120.  ESRD-on HD Monday Wednesday Friday.    Informed Consent   Shared Decision Making/Informed Consent The risks [chest pain, shortness of breath, cardiac arrhythmias, dizziness, blood pressure fluctuations, myocardial infarction, stroke/transient ischemic attack, nausea, vomiting, allergic reaction, radiation exposure, metallic taste sensation and life-threatening complications (estimated to be 1 in 10,000)], benefits (risk stratification, diagnosing coronary artery disease, treatment guidance) and alternatives of a nuclear stress test were discussed in detail with Ms. Perine and she agrees to proceed.     Dispo: Lexiscan , Norvasc  2.5 mg each evening, follow up 3 months with Dr. Krasowski.   Signed, Terrance Ferretti, NP

## 2024-03-18 ENCOUNTER — Encounter: Payer: Self-pay | Admitting: Cardiology

## 2024-03-18 ENCOUNTER — Ambulatory Visit: Attending: Cardiology | Admitting: Cardiology

## 2024-03-18 VITALS — BP 144/60 | HR 73 | Ht 64.0 in | Wt 129.8 lb

## 2024-03-18 DIAGNOSIS — N186 End stage renal disease: Secondary | ICD-10-CM

## 2024-03-18 DIAGNOSIS — I480 Paroxysmal atrial fibrillation: Secondary | ICD-10-CM

## 2024-03-18 DIAGNOSIS — I2510 Atherosclerotic heart disease of native coronary artery without angina pectoris: Secondary | ICD-10-CM | POA: Diagnosis not present

## 2024-03-18 DIAGNOSIS — R0609 Other forms of dyspnea: Secondary | ICD-10-CM | POA: Diagnosis not present

## 2024-03-18 DIAGNOSIS — D6859 Other primary thrombophilia: Secondary | ICD-10-CM | POA: Diagnosis not present

## 2024-03-18 DIAGNOSIS — I10 Essential (primary) hypertension: Secondary | ICD-10-CM

## 2024-03-18 DIAGNOSIS — R0602 Shortness of breath: Secondary | ICD-10-CM

## 2024-03-18 MED ORDER — AMLODIPINE BESYLATE 2.5 MG PO TABS: 2.5000 mg | ORAL_TABLET | ORAL | 3 refills | Status: DC

## 2024-03-18 NOTE — Patient Instructions (Signed)
 Medication Instructions:  Your physician has recommended you make the following change in your medication:  Start Norvasc  2.5 mg once daily, hold for top number less than 120  *If you need a refill on your cardiac medications before your next appointment, please call your pharmacy*  Lab Work: NONE If you have labs (blood work) drawn today and your tests are completely normal, you will receive your results only by: MyChart Message (if you have MyChart) OR A paper copy in the mail If you have any lab test that is abnormal or we need to change your treatment, we will call you to review the results.  Testing/Procedures: Your physician has requested that you have a lexiscan  myoview . For further information please visit [email or web address] Please follow instruction sheet, as given.   The test will take approximately 3 to 4 hours to complete; you may bring reading material.  If someone comes with you to your appointment, they will need to remain in the main lobby due to limited space in the testing area. **If you are pregnant or breastfeeding, please notify the nuclear lab prior to your appointment**  How to prepare for your Myocardial Perfusion Test:             Do not eat or drink 3 hours prior to your test, except you may have water . Do not consume products containing caffeine (regular or decaffeinated) 12 hours prior to your test. (ex: coffee, chocolate, sodas, tea). Do bring a list of your current medications with you.  If not listed below, you may take your medications as normal. Do wear comfortable clothes (no dresses or overalls) and walking shoes, tennis shoes preferred (No heels or open toe shoes are allowed). Do NOT wear cologne, perfume, aftershave, or lotions (deodorant is allowed). If these instructions are not followed, your test will have to be rescheduled.   Follow-Up: At [facility], you and your health needs are our priority.  As part of our continuing mission to  provide you with exceptional heart care, our providers are all part of one team.  This team includes your primary Cardiologist (physician) and Advanced Practice Providers or APPs (Physician Assistants and Nurse Practitioners) who all work together to provide you with the care you need, when you need it.  Your next appointment:   3 month(s)  Provider:   Ralene Burger, MD    We recommend signing up for the patient portal called "MyChart".  Sign up information is provided on this After Visit Summary.  MyChart is used to connect with patients for Virtual Visits (Telemedicine).  Patients are able to view lab/test results, encounter notes, upcoming appointments, etc.  Non-urgent messages can be sent to your provider as well.   To learn more about what you can do with MyChart, go to [email or web address].   Other Instructions

## 2024-03-19 ENCOUNTER — Telehealth: Payer: Self-pay | Admitting: Cardiology

## 2024-03-19 ENCOUNTER — Telehealth (HOSPITAL_COMMUNITY): Payer: Self-pay | Admitting: *Deleted

## 2024-03-19 DIAGNOSIS — Z7901 Long term (current) use of anticoagulants: Secondary | ICD-10-CM

## 2024-03-19 NOTE — Addendum Note (Signed)
 Addended by: Montey Ebel M on: 03/19/2024 09:29 AM   Modules accepted: Orders

## 2024-03-19 NOTE — Telephone Encounter (Signed)
 Can we let her daughter know that she should still be on Eliquis  2.5 mg twice day.  Her risk of a stroke is 11.2% per year if she goes out of rhythm, so even though she had some bleeding while placing her Dexcom, she should still be on it.  Repeat CBC in 2 weeks, she can have this done in [place] (where she lives).

## 2024-03-19 NOTE — Telephone Encounter (Signed)
 Patient's daughter given detailed instructions per Myocardial Perfusion Study Information Sheet for the test on 03/25/2024 at 8:00. Patient notified to arrive 15 minutes early and that it is imperative to arrive on time for appointment to keep from having the test rescheduled.  If you need to cancel or reschedule your appointment, please call the office within 24 hours of your appointment. . Patient's daughter verbalized understanding.Anne Barrios

## 2024-03-20 NOTE — Telephone Encounter (Signed)
 LM to return my call.

## 2024-03-20 NOTE — Telephone Encounter (Signed)
Pt requesting a c/b.

## 2024-03-21 NOTE — Telephone Encounter (Signed)
 Spoke with Elizabeth Olsen per DPR who states that the pt is having so much bleeding at dialysis. Pt has dementia and will remove the pressure dressing. Elizabeth Olsen states that the pt has only had 1 episode of know afib. Kardia and Apple watch are not doable as the pt is constantly removing devices or jewelry and gives them away or loses them. Would a referral for a loop recorder be ok?

## 2024-03-21 NOTE — Telephone Encounter (Signed)
 Patient scheduled after stress test next Tuesday and daughter is aware/kbl 03/21/24

## 2024-03-24 DIAGNOSIS — N186 End stage renal disease: Secondary | ICD-10-CM | POA: Diagnosis not present

## 2024-03-24 DIAGNOSIS — N2581 Secondary hyperparathyroidism of renal origin: Secondary | ICD-10-CM | POA: Diagnosis not present

## 2024-03-24 DIAGNOSIS — Z992 Dependence on renal dialysis: Secondary | ICD-10-CM | POA: Diagnosis not present

## 2024-03-25 ENCOUNTER — Encounter: Payer: Self-pay | Admitting: Cardiology

## 2024-03-25 ENCOUNTER — Ambulatory Visit: Attending: Cardiology

## 2024-03-25 ENCOUNTER — Ambulatory Visit (INDEPENDENT_AMBULATORY_CARE_PROVIDER_SITE_OTHER): Admitting: Cardiology

## 2024-03-25 VITALS — BP 110/70 | HR 87 | Ht 63.0 in | Wt 129.0 lb

## 2024-03-25 DIAGNOSIS — I480 Paroxysmal atrial fibrillation: Secondary | ICD-10-CM | POA: Diagnosis not present

## 2024-03-25 DIAGNOSIS — Z992 Dependence on renal dialysis: Secondary | ICD-10-CM | POA: Diagnosis not present

## 2024-03-25 DIAGNOSIS — I2510 Atherosclerotic heart disease of native coronary artery without angina pectoris: Secondary | ICD-10-CM | POA: Diagnosis not present

## 2024-03-25 DIAGNOSIS — N186 End stage renal disease: Secondary | ICD-10-CM | POA: Diagnosis not present

## 2024-03-25 DIAGNOSIS — I420 Dilated cardiomyopathy: Secondary | ICD-10-CM | POA: Diagnosis not present

## 2024-03-25 DIAGNOSIS — R0609 Other forms of dyspnea: Secondary | ICD-10-CM

## 2024-03-25 MED ORDER — TECHNETIUM TC 99M TETROFOSMIN IV KIT
28.8000 | PACK | Freq: Once | INTRAVENOUS | Status: AC | PRN
  Administered 2024-03-25: 28.8 via INTRAVENOUS

## 2024-03-25 MED ORDER — REGADENOSON 0.4 MG/5ML IV SOLN
0.4000 mg | Freq: Once | INTRAVENOUS | Status: AC
  Administered 2024-03-25: 0.4 mg via INTRAVENOUS

## 2024-03-25 MED ORDER — TECHNETIUM TC 99M TETROFOSMIN IV KIT
10.3000 | PACK | Freq: Once | INTRAVENOUS | Status: AC | PRN
  Administered 2024-03-25: 10.3 via INTRAVENOUS

## 2024-03-25 NOTE — Progress Notes (Unsigned)
 Cardiology Office Note:    Date:  03/25/2024   ID:  Elizabeth Olsen, DOB 1941-06-21, MRN [id number]  PCP:  Elvira Hammersmith, MD  Cardiologist:  Ralene Burger, MD    Referring MD: Elvira Hammersmith, *   Chief Complaint  Patient presents with   Follow-up   Diabetes better K Nuclear portion of the stress test: Resting images were abnormal showing mild defect involving basal and midportion of the inferior lateral wall Stress images were abnormal showing mild defect involving basal midportion of the inferior lateral wall as well as small defect involving the midportion of the anterior lateral wall.  Gated imaging were normal showing normal contractility in all segments overall left ventricular ejection fraction of 75%  Summary and conclusions: 1.  Very small area of ischemia involving midportion of the anterior lateral wall. 2.  Normal gated images. 3.  Normal ejection fraction calculated of 75%. Comments: Fixed defect involving basal midportion inferior lateral wall most likely represents diaphragmatic attenuation   History of Present Illness:    Elizabeth Olsen is a 83 y.o. 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  Comes today 2 months for follow-up cardiac wise for she is doing fine but she described to have some shortness of breath interesting that shortness of breath happen typically at evening time she laid down in the bed start getting short of breath, she did have some extra dialysis today to see if it helps but it did not.  Today she had a stress test done in my office which showed ejection fraction 51% but no evidence of ischemia.  On top of that issue of atrial fibrillation still ongoing.  Seems to  be normal rhythm however she does not take anticoagulation.  And I spoke to her as well as to her daughter on the phone who participated in decision making to have multiple explanation why they do not want to give her anticoagulation she is worried about her falling a lot she is worried about the fact she bleeds for all day after dialysis needle has been taking out, so basically they do not want her to be on anticoagulation.  I initiated conversation about potentially Watchman device versus implantable loop recorder to see the burden of atrial fibrillation and use anticoagulation only while she is having episode of atrial fibrillation even though there is no good data beyond this approach.  Past Medical History:  Diagnosis Date   A-fib (HCC)    Acquired hypothyroidism 10/13/2017   Acute diastolic CHF (congestive heart failure) (HCC) 11/20/2019   Acute pyelonephritis 01/20/2024   Acute respiratory failure with hypoxia (HCC) 10/13/2017   Acute systolic heart failure (HCC) 10/13/2017   Allergy, unspecified, initial encounter 05/08/2023   Anemia associated with diabetes mellitus (HCC) 10/13/2017   Anemia of chronic disease 12/10/2017   Atherosclerotic heart disease of native coronary artery without angina pectoris 05/08/2023   Blood transfusion without reported diagnosis    with breast surgery   Blue nevus 03/12/2024   Breast cancer (HCC) 15 years ago   left    Cardiomyopathy (HCC) 07/23/2017   Ejection fraction 45-50% may; from September 2018   Chronic cholecystitis with calculus 01/22/2018   Colon polyp 03/2007   adenomatous   Congestive heart failure (CHF) (HCC) 04/12/2020  Constipation 06/12/2023   COVID-19 virus infection 11/20/2019   Encounter for removal of sutures 09/12/2023   ESRD (end stage renal disease) (HCC) 09/21/2020   ESRD (end stage renal disease) on dialysis (HCC)    HD MWF   Essential hypertension 09/11/2018   Fibrous papule of nose 03/12/2024   GERD (gastroesophageal  reflux disease)    Heart murmur    as child   Hemodialysis patient (HCC) 01/20/2024   Hernia, incisional    Hyperlipidemia    Hypertension    Hypothyroidism    Iron  deficiency 07/23/2017   Iron  deficiency anemia, unspecified 05/08/2023   Moderate protein-calorie malnutrition (HCC) 06/28/2023   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) 02/14/2021   Near syncope 04/01/2018   Neuropathy    Onychodystrophy 03/12/2024   Osteoarthritis, hip, bilateral    Other disorders of phosphorus metabolism 11/08/2023   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   PONV (postoperative nausea and vomiting)    Refractory anemia, unspecified (HCC) 12/10/2017   Seborrheic keratoses 03/12/2024   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   Short-term memory loss    per pt's daughter   Type 1 diabetes (HCC) 07/23/2017   Type 2 diabetes mellitus with chronic kidney disease on chronic dialysis, without long-term current use of insulin  (HCC) 06/11/2023   Urinary tract infection, site not specified 01/22/2024   Vertigo 06/24/2018   Vitamin D  deficiency    Xerosis of skin 03/12/2024    Past Surgical History:  Procedure Laterality Date   A/V FISTULAGRAM N/A 05/16/2023   Procedure: A/V Fistulagram;  Surgeon: Kayla Part, MD;  Location: [facility] INVASIVE CV LAB;  Service: Cardiovascular;  Laterality: N/A;   A/V SHUNT INTERVENTION N/A 02/28/2024   Procedure: A/V SHUNT INTERVENTION;  Surgeon: Melodie Spry, MD;  Location: [facility] INVASIVE CV LAB;  Service: Cardiovascular;  Laterality: N/A;  70% innom   ABDOMINAL HYSTERECTOMY     APPENDECTOMY     BREAST SURGERY     CAPD REMOVAL N/A 05/31/2023   Procedure: REMOVAL CONTINUOUS AMBULATORY PERITONEAL DIALYSIS  (CAPD) CATHETER;  Surgeon: Carlene Che, MD;  Location: [facility] OR;  Service: Vascular;  Laterality: N/A;   CHOLECYSTECTOMY  01/22/2018   LAPROSCOPIC    CHOLECYSTECTOMY N/A 01/22/2018   Procedure: LAPAROSCOPIC CHOLECYSTECTOMY WITH INTRAOPERATIVE  CHOLANGIOGRAM;  Surgeon: Dareen Ebbing, MD;  Location: [facility] OR;  Service: General;  Laterality: N/A;   COLONOSCOPY  2013   due next 03-2017   EYE SURGERY     bil cataracts   IR THROMBECTOMY AV FISTULA W/THROMBOLYSIS/PTA INC/SHUNT/IMG RIGHT Right 09/11/2023   IR US  GUIDE VASC ACCESS RIGHT  09/11/2023   IUD REMOVAL     with appendectomy   MASTECTOMY, MODIFIED RADICAL W/RECONSTRUCTION Left 1998   10 nodes out   PERIPHERAL VASCULAR INTERVENTION  05/16/2023   Procedure: PERIPHERAL VASCULAR INTERVENTION;  Surgeon: Kayla Part, MD;  Location: [facility] INVASIVE CV LAB;  Service: Cardiovascular;;   THORACENTESIS Right 05/17/2023   Procedure: THORACENTESIS;  Surgeon: Josiah Nigh, MD;  Location: [facility] ENDOSCOPY;  Service: Pulmonary;  Laterality: Right;   TUMOR EXCISION Left    x 2, neck, head    Current Medications: Current Meds  Medication Sig   acetaminophen  (TYLENOL ) 325 MG tablet Take 650 mg by mouth every 6 (six) hours as needed for mild pain (pain score 1-3) (leg).   amLODipine  (NORVASC ) 2.5 MG tablet Take 1 tablet (2.5 mg total) by mouth as directed. Take at night, hold for systolic BP (top number) < 120.  cloNIDine  (CATAPRES ) 0.1 MG tablet Take 1 tablet (0.1 mg total) by mouth daily as needed (FOR SYSTOLIC BLOOD PRESSURE GREATER THAN 170 AND OR DIASTOLIC FOR BLOOD PRESSURE GREATER 100).   Glucagon (BAQSIMI ONE PACK) 3 MG/DOSE POWD 1 Dose by Other route as directed.   Glycerin-Hypromellose-PEG 400 (DRY EYE RELIEF DROPS) 0.2-0.2-1 % SOLN Place 1 drop into both eyes daily as needed (Dry eyes). Natural tears   insulin  glargine (LANTUS  SOLOSTAR) 100 UNIT/ML Solostar Pen Inject 8 Units into the skin every morning. And takes 2 units after each meal   insulin  lispro (HUMALOG ) 100 UNIT/ML injection Inject 1-2 Units into the skin See admin instructions. 2 units with each meal, 1 hour correction every 50 units over 200 give 1 unit   levothyroxine  (SYNTHROID ) 88 MCG tablet Take 1 tablet (88 mcg total)  by mouth daily before breakfast.   VELPHORO 500 MG chewable tablet Chew 500 mg by mouth with breakfast, with lunch, and with evening meal.   Vitamin D -Vitamin K (VITAMIN K2-VITAMIN D3 PO) Take 1 tablet by mouth daily. 125 mg /100 mg     Allergies:   Erythromycin, Penicillins, Tape, Dextromethorphan, Doxylamine-dm, Nyquil [pseudoeph-doxylamine-dm-apap], and Sulfa antibiotics   Social History   Socioeconomic History   Marital status: Widowed    Spouse name: Not on file   Number of children: 2   Years of education: Not on file   Highest education level: Not on file  Occupational History   Occupation: homemaker  Tobacco Use   Smoking status: Never   Smokeless tobacco: Never  Vaping Use   Vaping status: Never Used  Substance and Sexual Activity   Alcohol use: No   Drug use: No   Sexual activity: Not Currently  Other Topics Concern   Not on file  Social History Narrative   1 son is deceased. Husband is deceased   Social Drivers of Corporate investment banker Strain: Not on file  Food Insecurity: No Food Insecurity (01/25/2024)   Hunger Vital Sign    Worried About Running Out of Food in the Last Year: Never true    Ran Out of Food in the Last Year: Never true  Transportation Needs: No Transportation Needs (01/25/2024)   PRAPARE - Administrator, Civil Service (Medical): No    Lack of Transportation (Non-Medical): No  Physical Activity: Not on file  Stress: Not on file  Social Connections: Socially Isolated (01/21/2024)   Social Connection and Isolation Panel [NHANES]    Frequency of Communication with Friends and Family: More than three times a week    Frequency of Social Gatherings with Friends and Family: More than three times a week    Attends Religious Services: Never    Database administrator or Organizations: No    Attends Banker Meetings: Never    Marital Status: Widowed     Family History: The patient's family history includes Congestive  Heart Failure in her father; Diabetes in an other family member; Heart attack in her brother; Hypertension in her father, maternal grandfather, and maternal grandmother; Peripheral vascular disease in her father; Stomach cancer in her maternal grandmother. There is no history of Colon cancer, Esophageal cancer, Pancreatic cancer, or Rectal cancer. ROS:   Please see the history of present illness.    All 14 point review of systems negative except as described per history of present illness  EKGs/Labs/Other Studies Reviewed:         Recent Labs: 06/11/2023: Magnesium 2.2  06/19/2023: TSH 2.87 07/13/2023: ALT 9 01/20/2024: B Natriuretic Peptide 1,212.2 01/23/2024: BUN 64; Creatinine, Ser 6.30; Hemoglobin 11.0; Platelets 247; Potassium 5.0; Sodium 133  Recent Lipid Panel    Component Value Date/Time   CHOL 184 07/23/2017 1538   TRIG 91 07/23/2017 1538   HDL 77 07/23/2017 1538   CHOLHDL 2.4 07/23/2017 1538   LDLCALC 89 07/23/2017 1538    Physical Exam:    VS:  BP 110/70 (BP Location: Right Arm, Patient Position: Sitting)   Pulse 87   Ht 5\' 3"  (1.6 m)   Wt 129 lb (58.5 kg)   SpO2 94%   BMI 22.85 kg/m     Wt Readings from Last 3 Encounters:  03/25/24 129 lb (58.5 kg)  03/25/24 129 lb (58.5 kg)  03/18/24 129 lb 12.8 oz (58.9 kg)     GEN:  Well nourished, well developed in no acute distress HEENT: Normal NECK: No JVD; No carotid bruits LYMPHATICS: No lymphadenopathy CARDIAC: RRR, no murmurs, no rubs, no gallops RESPIRATORY:  Clear to auscultation without rales, wheezing or rhonchi  ABDOMEN: Soft, non-tender, non-distended MUSCULOSKELETAL:  No edema; No deformity  SKIN: Warm and dry LOWER EXTREMITIES: no swelling NEUROLOGIC:  Alert and oriented x 3 PSYCHIATRIC:  Normal affect   ASSESSMENT:    1. Dilated cardiomyopathy (HCC)   2. Atherosclerosis of native coronary artery of native heart without angina pectoris   3. Paroxysmal atrial fibrillation (HCC)   4. ESRD (end stage  renal disease) on dialysis [facility])    PLAN:    In order of problems listed above:  History of cardiomyopathy, will ask her to have echocardiogram repeated to recheck left ventricle ejection fraction the meantime I will not alter any of her medications. Coronary disease today she had a stress test done which is negative we will continue risk modifications. Paroxysmal atrial fibrillation.  Discussion as above we will refer her to Dr. Marven Slimmer for consideration of Watchman device versus implantable loop recorder. Chronic kidney failure on dialysis   Medication Adjustments/Labs and Tests Ordered: Current medicines are reviewed at length with the patient today.  Concerns regarding medicines are outlined above.  No orders of the defined types were placed in this encounter.  Medication changes: No orders of the defined types were placed in this encounter.   Signed, Manfred Seed, MD, [facility] 03/25/2024 12:00 PM    [facility]

## 2024-03-25 NOTE — Patient Instructions (Signed)
 Medication Instructions:  Your physician recommends that you continue on your current medications as directed. Please refer to the Current Medication list given to you today.  *If you need a refill on your cardiac medications before your next appointment, please call your pharmacy*   Lab Work: None Ordered If you have labs (blood work) drawn today and your tests are completely normal, you will receive your results only by: MyChart Message (if you have MyChart) OR A paper copy in the mail If you have any lab test that is abnormal or we need to change your treatment, we will call you to review the results.   Testing/Procedures: Your physician has requested that you have an echocardiogram. Echocardiography is a painless test that uses sound waves to create images of your heart. It provides your doctor with information about the size and shape of your heart and how well your heart's chambers and valves are working. This procedure takes approximately one hour. There are no restrictions for this procedure. Please do NOT wear cologne, perfume, aftershave, or lotions (deodorant is allowed). Please arrive 15 minutes prior to your appointment time.  Please note: We ask at that you not bring children with you during ultrasound (echo/ vascular) testing. Due to room size and safety concerns, children are not allowed in the ultrasound rooms during exams. Our front office staff cannot provide observation of children in our lobby area while testing is being conducted. An adult accompanying a patient to their appointment will only be allowed in the ultrasound room at the discretion of the ultrasound technician under special circumstances. We apologize for any inconvenience.    Follow-Up: At [facility], you and your health needs are our priority.  As part of our continuing mission to provide you with exceptional heart care, we have created designated Provider Care Teams.  These Care Teams include your  primary Cardiologist (physician) and Advanced Practice Providers (APPs -  Physician Assistants and Nurse Practitioners) who all work together to provide you with the care you need, when you need it.  We recommend signing up for the patient portal called "MyChart".  Sign up information is provided on this After Visit Summary.  MyChart is used to connect with patients for Virtual Visits (Telemedicine).  Patients are able to view lab/test results, encounter notes, upcoming appointments, etc.  Non-urgent messages can be sent to your provider as well.   To learn more about what you can do with MyChart, go to [email or web address].    Your next appointment:   3 month(s)  The format for your next appointment:   In Person  Provider:   Ralene Burger, MD    Other Instructions Appt with Dr. Marven Slimmer- They will call for appt

## 2024-03-27 ENCOUNTER — Ambulatory Visit (HOSPITAL_BASED_OUTPATIENT_CLINIC_OR_DEPARTMENT_OTHER): Payer: Self-pay | Admitting: Family

## 2024-03-27 LAB — MYOCARDIAL PERFUSION IMAGING
LV dias vol: 117 mL (ref 46–106)
LV sys vol: 57 mL
Nuc Stress EF: 51 %
Peak HR: 81 {beats}/min
Rest HR: 74 {beats}/min
Rest Nuclear Isotope Dose: 10.3 mCi
SDS: 0
SRS: 5
SSS: 5
ST Depression (mm): 0 mm
Stress Nuclear Isotope Dose: 28.8 mCi
TID: 0.94

## 2024-03-29 DIAGNOSIS — Z992 Dependence on renal dialysis: Secondary | ICD-10-CM | POA: Diagnosis not present

## 2024-03-29 DIAGNOSIS — E1122 Type 2 diabetes mellitus with diabetic chronic kidney disease: Secondary | ICD-10-CM | POA: Diagnosis not present

## 2024-03-29 DIAGNOSIS — N186 End stage renal disease: Secondary | ICD-10-CM | POA: Diagnosis not present

## 2024-03-31 DIAGNOSIS — Z992 Dependence on renal dialysis: Secondary | ICD-10-CM | POA: Diagnosis not present

## 2024-03-31 DIAGNOSIS — N2581 Secondary hyperparathyroidism of renal origin: Secondary | ICD-10-CM | POA: Diagnosis not present

## 2024-03-31 DIAGNOSIS — N186 End stage renal disease: Secondary | ICD-10-CM | POA: Diagnosis not present

## 2024-04-01 ENCOUNTER — Other Ambulatory Visit: Payer: Self-pay | Admitting: Cardiology

## 2024-04-01 MED ORDER — CLONIDINE HCL 0.1 MG PO TABS: 0.1000 mg | ORAL_TABLET | Freq: Every day | ORAL | 0 refills | Status: DC | PRN

## 2024-04-01 NOTE — Telephone Encounter (Signed)
*  STAT* If patient is at the pharmacy, call can be transferred to refill team.   1. Which medications need to be refilled? (please list name of each medication and dose if known)   cloNIDine  (CATAPRES ) 0.1 MG tablet   2. Would you like to learn more about the convenience, safety, & potential cost savings by using the [facility]?   3. Are you open to using the [facility] (Type [facility]. ).  4. Which pharmacy/location (including street and city if local pharmacy) is medication to be sent to?  [id number] - [place], [place] - [address] AT [facility] [address]   5. Do they need a 30 day or 90 day supply?   90 day  Daughter Shelagh Derrick) stated patient is completely out of this medication.

## 2024-04-01 NOTE — Telephone Encounter (Signed)
 Pt is requesting a refill on medication clonidine . Dr. Krasowski did not prescribe this medication. Would Dr. Krasowski like to refill this medication? Please address

## 2024-04-07 DIAGNOSIS — N186 End stage renal disease: Secondary | ICD-10-CM | POA: Diagnosis not present

## 2024-04-07 DIAGNOSIS — N2581 Secondary hyperparathyroidism of renal origin: Secondary | ICD-10-CM | POA: Diagnosis not present

## 2024-04-07 DIAGNOSIS — D631 Anemia in chronic kidney disease: Secondary | ICD-10-CM | POA: Diagnosis not present

## 2024-04-07 DIAGNOSIS — Z992 Dependence on renal dialysis: Secondary | ICD-10-CM | POA: Diagnosis not present

## 2024-04-08 ENCOUNTER — Telehealth: Payer: Self-pay | Admitting: Emergency Medicine

## 2024-04-08 NOTE — Telephone Encounter (Signed)
 Copied from CRM [id number]. Topic: Clinical - Home Health Verbal Orders >> Apr 08, 2024  9:38 AM Albertha Alosa wrote: Caller/Agency: [facility] - leave voicemail  Callback Number: [phone] Service Requested: Physical Therapy Frequency: 2 week 2  Any new concerns about the patient? No

## 2024-04-09 NOTE — Telephone Encounter (Signed)
 Ok with orders as requested

## 2024-04-10 ENCOUNTER — Ambulatory Visit: Payer: Self-pay

## 2024-04-10 NOTE — Telephone Encounter (Signed)
 FYI Only or Action Required?: Action required by provider  Patient was last seen in primary care on 02/05/2024 by Elvira Hammersmith, MD. Called Nurse Triage reporting Hypertension. Symptoms began a week ago. PT Elizabeth Olsen reports BP goes up during sessions and family has had high readings as well. Today BP 150/60, 140/60, 160/60. Family has not needed to give any extra BP meds ,  asking if PCP wants to change her med by lowering the parameters for additional doses.Interventions attempted: Prescription medications:  Aaron Aas Symptoms are: unchanged.No symptoms.  Triage Disposition: See Physician Within 24 Hours  Patient/caregiver understands and will follow disposition?: No, refuses disposition          Copied from CRM [id number]. Topic: Clinical - Red Word Triage >> Apr 10, 2024  1:07 PM [place] A wrote: [facility] that prompted transfer to Nurse Triage: PT is currently with patient at home and is very concerned with patient blood pressure being high Reason for Disposition  Systolic BP  >= 180 OR Diastolic >= 110  Answer Assessment - Initial Assessment Questions 1. BLOOD PRESSURE: What is the blood pressure? Did you take at least two measurements 5 minutes apart?     150/60, 140/60  160/60 2. ONSET: When did you take your blood pressure?     Today 3. HOW: How did you take your blood pressure? (e.g., automatic home BP monitor, visiting nurse)     PT 4. HISTORY: Do you have a history of high blood pressure?     Yes 5. MEDICINES: Are you taking any medicines for blood pressure? Have you missed any doses recently?     Yes 6. OTHER SYMPTOMS: Do you have any symptoms? (e.g., blurred vision, chest pain, difficulty breathing, headache, weakness)     None 7. PREGNANCY: Is there any chance you are pregnant? When was your last menstrual period?     no  Protocols used: Blood Pressure - High-A-AH

## 2024-04-10 NOTE — Telephone Encounter (Signed)
 What are the requests?

## 2024-04-10 NOTE — Telephone Encounter (Signed)
 LVM on secure line for "OKAY " verbal orders

## 2024-04-12 NOTE — Telephone Encounter (Signed)
 Amlodipine  can be increased to 5 mg daily with close monitoring of blood pressure readings daily.  If any hypotensive episodes then go back to 2.5 mg daily.  Thanks.

## 2024-04-14 DIAGNOSIS — N2581 Secondary hyperparathyroidism of renal origin: Secondary | ICD-10-CM | POA: Diagnosis not present

## 2024-04-14 DIAGNOSIS — Z992 Dependence on renal dialysis: Secondary | ICD-10-CM | POA: Diagnosis not present

## 2024-04-14 DIAGNOSIS — N186 End stage renal disease: Secondary | ICD-10-CM | POA: Diagnosis not present

## 2024-04-14 NOTE — Telephone Encounter (Signed)
 LVM for Shelagh Derrick, daughter to call back regarding her mothers b/p

## 2024-04-21 DIAGNOSIS — Z992 Dependence on renal dialysis: Secondary | ICD-10-CM | POA: Diagnosis not present

## 2024-04-21 DIAGNOSIS — N2581 Secondary hyperparathyroidism of renal origin: Secondary | ICD-10-CM | POA: Diagnosis not present

## 2024-04-21 DIAGNOSIS — D631 Anemia in chronic kidney disease: Secondary | ICD-10-CM | POA: Diagnosis not present

## 2024-04-21 DIAGNOSIS — N186 End stage renal disease: Secondary | ICD-10-CM | POA: Diagnosis not present

## 2024-04-28 DIAGNOSIS — Z992 Dependence on renal dialysis: Secondary | ICD-10-CM | POA: Diagnosis not present

## 2024-04-28 DIAGNOSIS — E1122 Type 2 diabetes mellitus with diabetic chronic kidney disease: Secondary | ICD-10-CM | POA: Diagnosis not present

## 2024-04-28 DIAGNOSIS — N2581 Secondary hyperparathyroidism of renal origin: Secondary | ICD-10-CM | POA: Diagnosis not present

## 2024-04-28 DIAGNOSIS — N186 End stage renal disease: Secondary | ICD-10-CM | POA: Diagnosis not present

## 2024-04-30 DIAGNOSIS — Z992 Dependence on renal dialysis: Secondary | ICD-10-CM | POA: Diagnosis not present

## 2024-04-30 DIAGNOSIS — N2581 Secondary hyperparathyroidism of renal origin: Secondary | ICD-10-CM | POA: Diagnosis not present

## 2024-04-30 DIAGNOSIS — N186 End stage renal disease: Secondary | ICD-10-CM | POA: Diagnosis not present

## 2024-05-01 ENCOUNTER — Ambulatory Visit (HOSPITAL_COMMUNITY)
Admission: RE | Admit: 2024-05-01 | Discharge: 2024-05-01 | Disposition: A | Discharge status: Home / Self Care | Source: Ambulatory Visit | Attending: Cardiology | Admitting: Cardiology

## 2024-05-01 DIAGNOSIS — R0609 Other forms of dyspnea: Secondary | ICD-10-CM

## 2024-05-01 LAB — ECHOCARDIOGRAM COMPLETE
Area-P 1/2: 3.77 cm2
MV M vel: 4.17 m/s
MV Peak grad: 69.6 mmHg
P 1/2 time: 816 ms
Radius: 0.7 cm
S' Lateral: 3.5 cm

## 2024-05-05 ENCOUNTER — Other Ambulatory Visit: Payer: Self-pay

## 2024-05-05 ENCOUNTER — Emergency Department (HOSPITAL_COMMUNITY)
Admission: EM | Admit: 2024-05-05 | Discharge: 2024-05-05 | Disposition: A | Discharge status: Home / Self Care | Attending: Emergency Medicine | Admitting: Emergency Medicine

## 2024-05-05 ENCOUNTER — Emergency Department (HOSPITAL_COMMUNITY)

## 2024-05-05 ENCOUNTER — Encounter (HOSPITAL_COMMUNITY): Payer: Self-pay

## 2024-05-05 DIAGNOSIS — Z992 Dependence on renal dialysis: Secondary | ICD-10-CM | POA: Insufficient documentation

## 2024-05-05 DIAGNOSIS — I120 Hypertensive chronic kidney disease with stage 5 chronic kidney disease or end stage renal disease: Secondary | ICD-10-CM | POA: Insufficient documentation

## 2024-05-05 DIAGNOSIS — I10 Essential (primary) hypertension: Secondary | ICD-10-CM | POA: Diagnosis not present

## 2024-05-05 DIAGNOSIS — R739 Hyperglycemia, unspecified: Secondary | ICD-10-CM | POA: Diagnosis not present

## 2024-05-05 DIAGNOSIS — Z853 Personal history of malignant neoplasm of breast: Secondary | ICD-10-CM | POA: Diagnosis not present

## 2024-05-05 DIAGNOSIS — C9002 Multiple myeloma in relapse: Secondary | ICD-10-CM | POA: Diagnosis not present

## 2024-05-05 DIAGNOSIS — E039 Hypothyroidism, unspecified: Secondary | ICD-10-CM | POA: Diagnosis not present

## 2024-05-05 DIAGNOSIS — Z79899 Other long term (current) drug therapy: Secondary | ICD-10-CM | POA: Insufficient documentation

## 2024-05-05 DIAGNOSIS — J90 Pleural effusion, not elsewhere classified: Secondary | ICD-10-CM | POA: Diagnosis not present

## 2024-05-05 DIAGNOSIS — R9082 White matter disease, unspecified: Secondary | ICD-10-CM | POA: Diagnosis not present

## 2024-05-05 DIAGNOSIS — F0390 Unspecified dementia without behavioral disturbance: Secondary | ICD-10-CM | POA: Insufficient documentation

## 2024-05-05 DIAGNOSIS — I272 Pulmonary hypertension, unspecified: Secondary | ICD-10-CM | POA: Insufficient documentation

## 2024-05-05 DIAGNOSIS — Z9049 Acquired absence of other specified parts of digestive tract: Secondary | ICD-10-CM | POA: Diagnosis not present

## 2024-05-05 DIAGNOSIS — I5032 Chronic diastolic (congestive) heart failure: Secondary | ICD-10-CM | POA: Diagnosis not present

## 2024-05-05 DIAGNOSIS — R4182 Altered mental status, unspecified: Secondary | ICD-10-CM | POA: Insufficient documentation

## 2024-05-05 DIAGNOSIS — Z794 Long term (current) use of insulin: Secondary | ICD-10-CM | POA: Insufficient documentation

## 2024-05-05 DIAGNOSIS — D72829 Elevated white blood cell count, unspecified: Secondary | ICD-10-CM | POA: Insufficient documentation

## 2024-05-05 DIAGNOSIS — I517 Cardiomegaly: Secondary | ICD-10-CM | POA: Diagnosis not present

## 2024-05-05 DIAGNOSIS — N186 End stage renal disease: Secondary | ICD-10-CM | POA: Insufficient documentation

## 2024-05-05 DIAGNOSIS — R918 Other nonspecific abnormal finding of lung field: Secondary | ICD-10-CM | POA: Diagnosis not present

## 2024-05-05 DIAGNOSIS — K8689 Other specified diseases of pancreas: Secondary | ICD-10-CM | POA: Diagnosis not present

## 2024-05-05 DIAGNOSIS — N281 Cyst of kidney, acquired: Secondary | ICD-10-CM | POA: Diagnosis not present

## 2024-05-05 DIAGNOSIS — K5730 Diverticulosis of large intestine without perforation or abscess without bleeding: Secondary | ICD-10-CM | POA: Diagnosis not present

## 2024-05-05 LAB — COMPREHENSIVE METABOLIC PANEL WITH GFR
ALT: 19 U/L (ref 0–44)
AST: 25 U/L (ref 15–41)
Albumin: 3.5 g/dL (ref 3.5–5.0)
Alkaline Phosphatase: 81 U/L (ref 38–126)
Anion gap: 17 — ABNORMAL HIGH (ref 5–15)
BUN: 69 mg/dL — ABNORMAL HIGH (ref 8–23)
CO2: 27 mmol/L (ref 22–32)
Calcium: 9.5 mg/dL (ref 8.9–10.3)
Chloride: 89 mmol/L — ABNORMAL LOW (ref 98–111)
Creatinine, Ser: 4.53 mg/dL — ABNORMAL HIGH (ref 0.44–1.00)
GFR, Estimated: 9 mL/min — ABNORMAL LOW (ref >60)
Glucose, Bld: 242 mg/dL — ABNORMAL HIGH (ref 70–99)
Potassium: 5.1 mmol/L (ref 3.5–5.1)
Sodium: 133 mmol/L — ABNORMAL LOW (ref 135–145)
Total Bilirubin: 0.8 mg/dL (ref 0.0–1.2)
Total Protein: 7.3 g/dL (ref 6.5–8.1)

## 2024-05-05 LAB — RESP PANEL BY RT-PCR (RSV, FLU A&B, COVID)  RVPGX2
Influenza A by PCR: NEGATIVE
Influenza B by PCR: NEGATIVE
Resp Syncytial Virus by PCR: NEGATIVE
SARS Coronavirus 2 by RT PCR: NEGATIVE

## 2024-05-05 LAB — CBC
HCT: 36.2 % (ref 36.0–46.0)
Hemoglobin: 11.7 g/dL — ABNORMAL LOW (ref 12.0–15.0)
MCH: 30.2 pg (ref 26.0–34.0)
MCHC: 32.3 g/dL (ref 30.0–36.0)
MCV: 93.5 fL (ref 80.0–100.0)
Platelets: 137 K/uL — ABNORMAL LOW (ref 150–400)
RBC: 3.87 MIL/uL (ref 3.87–5.11)
RDW: 15.9 % — ABNORMAL HIGH (ref 11.5–15.5)
WBC: 12.2 K/uL — ABNORMAL HIGH (ref 4.0–10.5)
nRBC: 0 % (ref 0.0–0.2)

## 2024-05-05 LAB — BRAIN NATRIURETIC PEPTIDE: B Natriuretic Peptide: 2788.4 pg/mL — ABNORMAL HIGH (ref 0.0–100.0)

## 2024-05-05 MED ORDER — SODIUM ZIRCONIUM CYCLOSILICATE 10 G PO PACK
10.0000 g | PACK | Freq: Once | ORAL | Status: AC
  Administered 2024-05-05: 10 g via ORAL
  Filled 2024-05-05: qty 1

## 2024-05-05 MED ORDER — SODIUM CHLORIDE 0.9 % IV BOLUS
500.0000 mL | Freq: Once | INTRAVENOUS | Status: AC
  Administered 2024-05-05: 500 mL via INTRAVENOUS

## 2024-05-05 MED ORDER — IOHEXOL 350 MG/ML SOLN
75.0000 mL | Freq: Once | INTRAVENOUS | Status: AC | PRN
  Administered 2024-05-05: 75 mL via INTRAVENOUS

## 2024-05-05 NOTE — ED Notes (Signed)
 Phlebotomy to collect blood work

## 2024-05-05 NOTE — ED Triage Notes (Signed)
 Patient BIB EMS from Home with the chief complaint of altered mental status by the care giver (daughter). Daughter states that patient is just not acting as her typical baseline. Patient does have a history of dementia and UTI. Daughter states that they usually do not know if the patient has a UTI until she starts to get blood in her urine due to taking dialysis and not making urine. Daughter states that the patient had an increased blood sugar and blood pressure all night. Daughter also reports a decrease in activity.

## 2024-05-05 NOTE — ED Notes (Signed)
 Pt unable to give urine sample at this time.  MD Nanavati notified and aware.

## 2024-05-05 NOTE — Discharge Instructions (Signed)
 You were seen today for confusion and malaise. While you were here we monitored your vitals, preformed a physical exam, and labs + imaging studies. These were all reassuring and there is no indication for any further testing or intervention in the emergency department at this time.   Things to do:  - Follow up with your primary care provider within the next 1-2 weeks - Go to hemodialysis session tomorrow/as soon as possible. If you are unable to get dialysis tomorrow, please contact your nephrologist to see if you should come immediately to the ER.  Return to the emergency department if you have any new or worsening symptoms including worsening confusion/altered mental status, unresponsiveness, severe abdominal pain, continued decreased oral intake, or if you have any other serious medical concerns.

## 2024-05-05 NOTE — ED Notes (Signed)
 Attempted to I+O pt. W/o success. Tech then bladder scanned pt. And resulted in 11mL being found in bladder. RN aware

## 2024-05-05 NOTE — ED Provider Notes (Signed)
 [facility] AT [facility] Provider Note   CSN: [id number] Arrival date & time: 05/05/24  1149     Patient presents with: Altered Mental Status   Elizabeth Olsen is a 83 y.o. female with PMHx left-sided breast cancer s/p treatment, dementia, HFpEF, multiple myeloma in relapse, hypertension, dyslipidemia, hypothyroidism, paroxysmal atrial fibrillation not on AC, and ESRD on hemodialysis (MWF) who presents due to family concern for AMS. Per patient's daughter at bedside, patient has been acting unlike herself for 1.5 days, with decreased activity, decreased appetite, and slight confusion from her baseline. Patient has no complaints. Patient's daughter states that when this happens, she cannot get dialysis so today's session was missed and they came to the ED. Patient has history of UTIs with similar presentations but makes very little urine at baseline so it is often hard to diagnose. Patient's daughter also states that patient's BP was a little elevated today at home (180-190/100) but she gave her home metoprolol  + clonidine  and it has improved. Patient complained of nausea earlier but denies now, and otherwise has had no vomiting, bloody stools, headache, fevers, cough, or SOB.   Prior to Admission medications   Medication Sig Start Date End Date Taking? Authorizing Provider  acetaminophen  (TYLENOL ) 325 MG tablet Take 650 mg by mouth every 6 (six) hours as needed for mild pain (pain score 1-3) (leg).    [provider]  amLODipine  (NORVASC ) 2.5 MG tablet Take 1 tablet (2.5 mg total) by mouth as directed. Take at night, hold for systolic BP (top number) < 120. 03/18/24 06/16/24  Carlin Delon BROCKS, NP  cloNIDine  (CATAPRES ) 0.1 MG tablet Take 1 tablet (0.1 mg total) by mouth daily as needed (FOR SYSTOLIC BLOOD PRESSURE GREATER THAN 170 AND OR DIASTOLIC FOR BLOOD PRESSURE GREATER 100). 04/01/24   Krasowski, Robert J, MD  Glucagon (BAQSIMI ONE PACK) 3 MG/DOSE POWD 1  Dose by Other route as directed. 01/10/24   [provider]  Glycerin -Hypromellose-PEG 400 (DRY EYE RELIEF DROPS) 0.2-0.2-1 % SOLN Place 1 drop into both eyes daily as needed (Dry eyes). Natural tears    [provider]  insulin  glargine (LANTUS  SOLOSTAR) 100 UNIT/ML Solostar Pen Inject 8 Units into the skin every morning. And takes 2 units after each meal 01/10/24   [provider]  insulin  lispro (HUMALOG ) 100 UNIT/ML injection Inject 1-2 Units into the skin See admin instructions. 2 units with each meal, 1 hour correction every 50 units over 200 give 1 unit 01/10/24   [provider]  levothyroxine  (SYNTHROID ) 88 MCG tablet Take 1 tablet (88 mcg total) by mouth daily before breakfast. 12/19/23 12/18/24  [name], MD  VELPHORO 500 MG chewable tablet Chew 500 mg by mouth with breakfast, with lunch, and with evening meal. 09/11/23   [provider]  Vitamin D -Vitamin K (VITAMIN K2-VITAMIN D3 PO) Take 1 tablet by mouth daily. 125 mg /100 mg    [provider]    Allergies: Erythromycin, Penicillins, Tape, Dextromethorphan, Doxylamine-dm, Nyquil [pseudoeph-doxylamine-dm-apap], and Sulfa antibiotics      Updated Vital Signs BP (!) 160/70   Pulse 85   Temp 98.2 F (36.8 C)   Resp 18   Ht 5' 3 (1.6 m)   SpO2 99%   BMI 22.85 kg/m   Physical Exam Vitals reviewed.  Constitutional:      General: She is not in acute distress.    Appearance: She is not toxic-appearing.  HENT:     Head:  Normocephalic and atraumatic.     Nose: Nose normal. No rhinorrhea.     Mouth/Throat:     Pharynx: Oropharynx is clear.     Comments: Tacky mucous membranes Eyes:     General: No scleral icterus.    Extraocular Movements: Extraocular movements intact.     Pupils: Pupils are equal, round, and reactive to light.  Cardiovascular:     Rate and Rhythm: Normal rate and regular rhythm.     Heart sounds: No murmur heard.    No gallop.  Pulmonary:      Effort: Pulmonary effort is normal. No respiratory distress.     Breath sounds: Normal breath sounds.  Abdominal:     General: Abdomen is flat. There is no distension.     Palpations: Abdomen is soft.     Tenderness: There is no abdominal tenderness. There is no right CVA tenderness, left CVA tenderness or guarding.  Musculoskeletal:        General: No deformity. Normal range of motion.     Cervical back: Normal range of motion and neck supple.     Right lower leg: No edema.     Left lower leg: No edema.  Skin:    General: Skin is warm and dry.     Capillary Refill: Capillary refill takes less than 2 seconds.     Coloration: Skin is not jaundiced.  Neurological:     Mental Status: She is alert and oriented to person, place, and time.     (all labs ordered are listed, but only abnormal results are displayed) Labs Reviewed  CBC - Abnormal; Notable for the following components:      Result Value   WBC 12.2 (*)    Hemoglobin 11.7 (*)    RDW 15.9 (*)    Platelets 137 (*)    All other components within normal limits  COMPREHENSIVE METABOLIC PANEL WITH GFR - Abnormal; Notable for the following components:   Sodium 133 (*)    Chloride 89 (*)    Glucose, Bld 242 (*)    BUN 69 (*)    Creatinine, Ser 4.53 (*)    GFR, Estimated 9 (*)    Anion gap 17 (*)    All other components within normal limits  BRAIN NATRIURETIC PEPTIDE - Abnormal; Notable for the following components:   B Natriuretic Peptide 2,788.4 (*)    All other components within normal limits  RESP PANEL BY RT-PCR (RSV, FLU A&B, COVID)  [id number]  URINE CULTURE    EKG: EKG Interpretation Date/Time:  Monday May 05 2024 15:52:55 EDT Ventricular Rate:  79 PR Interval:  165 QRS Duration:  103 QT Interval:  410 QTC Calculation: 470 R Axis:   121  Text Interpretation: Sinus rhythm Right axis deviation No acute changes No significant change since last tracing Confirmed by Charlyn Sora [phone]) on 05/05/2024 4:25:17  PM  Radiology: CT ABDOMEN PELVIS W CONTRAST Result Date: 05/05/2024 CLINICAL DATA:  rule out pyelonephritis, anuric EXAM: CT ABDOMEN AND PELVIS WITH CONTRAST TECHNIQUE: Multidetector CT imaging of the abdomen and pelvis was performed using the standard protocol following bolus administration of intravenous contrast. RADIATION DOSE REDUCTION: This exam was performed according to the departmental dose-optimization program which includes automated exposure control, adjustment of the mA and/or kV according to patient size and/or use of iterative reconstruction technique. CONTRAST:  75mL OMNIPAQUE  IOHEXOL  350 MG/ML SOLN COMPARISON:  CT abdomen pelvis 06/11/2023, CT abdomen pelvis 01/20/2024 FINDINGS: Lower chest: Small to moderate volume right pleural  effusion. Left base patchy airspace opacity with nodule like pericentimeter density (5:11). Hepatobiliary: No focal liver abnormality. Status post cholecystectomy. No biliary dilatation. Pancreas: Diffusely atrophic. No focal lesion. Otherwise normal pancreatic contour. No surrounding inflammatory changes. No main pancreatic ductal dilatation. Spleen: Normal in size without focal abnormality. Adrenals/Urinary Tract: No adrenal nodule bilaterally. Bilateral kidneys enhance symmetrically. Fluid density lesions likely represent simple renal cysts. Simple renal cysts, in the absence of clinically indicated signs/symptoms, require no independent follow-up. No hydronephrosis. No hydroureter. No nephroureterolithiasis bilaterally. The urinary bladder is unremarkable. On delayed imaging, there is no excretion of intravenous contrast from either kidneys. Stomach/Bowel: Stomach is within normal limits. No evidence of bowel wall thickening or dilatation. Scattered colonic diverticula. The appendix is not definitely identified with no inflammatory changes in the right lower quadrant to suggest acute appendicitis. Vascular/Lymphatic: No abdominal aorta or iliac aneurysm. At least  moderate atherosclerotic plaque of the aorta and its branches. No abdominal, pelvic, or inguinal lymphadenopathy. Reproductive: Status post hysterectomy. No adnexal masses. Other: No intraperitoneal free fluid. No intraperitoneal free gas. No organized fluid collection. Musculoskeletal: No abdominal wall hernia or abnormality. Subcutaneus soft tissue edema. Retained metallic densities along left lower chest and abdominal wall. No suspicious lytic or blastic osseous lesions. No acute displaced fracture. Multilevel degenerative changes of the spine. IMPRESSION: 1. Left base patchy airspace opacity with nodularlike pericentimeter density. Finding could represent a combination of atelectasis versus infection versus underlying nodule. Recommend repeat CT in 3 months to evaluate for complete resolution. 2. Small to moderate volume right pleural effusion. 3. No excretion of intravenous contrast from either kidneys on delayed images. Correlate with renal function test. 4. Colonic diverticulosis with no acute diverticulitis. Electronically Signed   By: Morgane  Naveau M.D.   On: 05/05/2024 20:54   CT Head Wo Contrast Result Date: 05/05/2024 CLINICAL DATA:  Mental status change, unknown cause EXAM: CT HEAD WITHOUT CONTRAST TECHNIQUE: Contiguous axial images were obtained from the base of the skull through the vertex without intravenous contrast. RADIATION DOSE REDUCTION: This exam was performed according to the departmental dose-optimization program which includes automated exposure control, adjustment of the mA and/or kV according to patient size and/or use of iterative reconstruction technique. COMPARISON:  CT of the head dated May 22, 2023. FINDINGS: Brain: Age-related cerebral volume loss. There is mild periventricular and deep cerebral white matter disease. There is no evidence of hemorrhage, mass, acute cortical infarct or hydrocephalus. Vascular: Age-related atrophy and mild periventricular white matter disease.  Skull: Intact and unremarkable. Sinuses/Orbits: Clear paranasal sinuses. Status post bilateral lens replacement. Other: None. IMPRESSION: 1. Age-related atrophy and mild periventricular white matter disease. No apparent acute process. Electronically Signed   By: Evalene Coho M.D.   On: 05/05/2024 17:37   DG Chest 1 View Result Date: 05/05/2024 CLINICAL DATA:  Altered mental status EXAM: CHEST  1 VIEW COMPARISON:  01/20/2024 FINDINGS: Moderate cardiomegaly. Mildly elevated right hemidiaphragm with some blunting of the right lateral costophrenic angle, query small right pleural effusion. Atherosclerotic calcification of the aortic arch. Left breast clips and left axillary clips noted. Indistinct pulmonary vasculature favoring pulmonary venous hypertension. Thoracic spondylosis. IMPRESSION: 1. Moderate cardiomegaly with pulmonary venous hypertension and possible small right pleural effusion. 2. Aortic Atherosclerosis (ICD10-I70.0). Electronically Signed   By: Ryan Salvage M.D.   On: 05/05/2024 13:20     Medications Ordered in the ED  sodium chloride  0.9 % bolus 500 mL (0 mLs Intravenous Stopped 05/05/24 1838)  sodium zirconium cyclosilicate  (LOKELMA ) packet 10 g (  10 g Oral Given 05/05/24 2015)  iohexol  (OMNIPAQUE ) 350 MG/ML injection 75 mL (75 mLs Intravenous Contrast Given 05/05/24 2035)    Clinical Course as of 05/06/24 1213  Tue May 06, 2024  1150 BUN(!): 69 Recent post-dialysis values ~30. Cr 4.53, not significantly elevated for having missed dialysis today. Glucose 242. Otherwise CMP unremarkable [AD]  1151 CBC(!) Wbc mildly elevated at 12.2, otherwise unremarkable/at baseline [AD]  1151 B Natriuretic Peptide(!): 2,788.4 [AD]  1151 Resp panel by RT-PCR (RSV, Flu A&B, Covid) Anterior Nasal Swab negative [AD]  1152 DG Chest 1 View Moderate cardiomegaly with pulmonary venous hypertension and possible small right pleural effusion   [AD]  1152 CT Head Wo Contrast Age-related atrophy and  mild periventricular white matter disease. No apparent acute process.   [AD]  1152 - Left base patchy airspace opacity with nodularlike pericentimeter density.  to evaluate for complete resolution. - Small to moderate volume right pleural effusion. - No excretion of intravenous contrast from either kidneys on delayed images. No acute findings suggestive of pyelonephritis. - Colonic diverticulosis with no acute diverticulitis   [AD]    Clinical Course User Index [AD] Raoul Rake, MD    Medical Decision Making Patient with the above history presenting with 1.5 days of AMS from baseline and decreased appetite/malaise, leading to her having missed HD session today. Patient herself has no complaints but the concerns are relayed by caregiver/daughter.  Differentials include metabolic/uremic encephalopathy, UTI/pyelo, CVA, hypertensive encephalopathy, advancing dementia, flu/covid. Patient having missed dialysis likely is now contributing to further AMS/malaise. No recent head trauma/falls but will get CT head given patient's baseline dementia and complaint of AMS. Given history of similar presentations with UTIs, increased concern for UTI/pyelonephritis.  Will get CBC, CMP, respiratory viral swab, EKG, CXR, CT head. Attempted to collect UA/urine culture however baseline is near-anuric at baseline and sample not obtained, so discussed with patient's family about getting CT AP to evaluate for signs of pyelo or other intraabdominal infection.   Results as above in ED course with overall no significant findings to suggest cause of patient's symptoms. Moderately increased BUN from her usual baseline though this is not unlikely after having missed HD today. No other notable electrolyte derangements concerning for emergent dialysis need. No signs of pyelo or other acute infection on CT AP. Patient's condition remained stable while in the ED and she tolerated po as much as would allow given her  baseline fluid restrictions. Spoke with family and they prefer to discharge and plan for outpatient dialysis tomorrow rather than admit for dialysis in the morning and likely discharge thereafter.   Amount and/or Complexity of Data Reviewed Labs: ordered. Decision-making details documented in ED Course. Radiology: ordered. Decision-making details documented in ED Course. ECG/medicine tests: ordered.    Details: Interpretation as above  Risk Prescription drug management.    Final diagnoses:  Altered mental status, unspecified altered mental status type  ESRD (end stage renal disease) on dialysis [facility])    ED Discharge Orders     None          Raoul Rake, MD 05/06/24 1214    Charlyn Sora, MD 05/09/24 [id number]

## 2024-05-05 NOTE — ED Notes (Signed)
 Patient's daughter states she checked patient's blood sugar and gave her 2 units of novolog  as a result.

## 2024-05-05 NOTE — ED Notes (Signed)
 2 nurse x2 attempts to start IV unsuccessful.

## 2024-05-05 NOTE — Care Management (Addendum)
 Attempted to  get in touch to discuss assistance a home patient currently in radiology. Called listed number did not go through

## 2024-05-05 NOTE — ED Provider Notes (Signed)
 4:02 PM Patient was initially evaluated in hallway bed, lab work ordered, patient assessed, after a protracted attempt, difficulty getting access or bloodwork, she was moved to a room, patient's daughter requested that she not be evaluated by this physician assistant, new attending Dr. Charlyn took over her care   Rosan Sherlean DEL, PA-C 05/05/24 1602    Tegeler, Lonni PARAS, MD 05/05/24 1730

## 2024-05-06 DIAGNOSIS — Z992 Dependence on renal dialysis: Secondary | ICD-10-CM | POA: Diagnosis not present

## 2024-05-06 DIAGNOSIS — N186 End stage renal disease: Secondary | ICD-10-CM | POA: Diagnosis not present

## 2024-05-06 DIAGNOSIS — D631 Anemia in chronic kidney disease: Secondary | ICD-10-CM | POA: Diagnosis not present

## 2024-05-06 DIAGNOSIS — N2581 Secondary hyperparathyroidism of renal origin: Secondary | ICD-10-CM | POA: Diagnosis not present

## 2024-05-08 ENCOUNTER — Encounter: Payer: Self-pay | Admitting: Psychology

## 2024-05-08 ENCOUNTER — Ambulatory Visit: Payer: 59 | Admitting: Psychology

## 2024-05-08 ENCOUNTER — Ambulatory Visit: Payer: Self-pay | Admitting: Psychology

## 2024-05-08 ENCOUNTER — Ambulatory Visit: Attending: Cardiology | Admitting: Cardiology

## 2024-05-08 DIAGNOSIS — G309 Alzheimer's disease, unspecified: Secondary | ICD-10-CM

## 2024-05-08 DIAGNOSIS — R4189 Other symptoms and signs involving cognitive functions and awareness: Secondary | ICD-10-CM

## 2024-05-08 DIAGNOSIS — F0280 Dementia in other diseases classified elsewhere without behavioral disturbance: Secondary | ICD-10-CM | POA: Diagnosis not present

## 2024-05-08 DIAGNOSIS — F0150 Vascular dementia without behavioral disturbance: Secondary | ICD-10-CM

## 2024-05-08 NOTE — Progress Notes (Unsigned)
 Electrophysiology Office Note:   Date:  05/08/2024  ID:  Elizabeth Olsen, DOB [birth year], MRN [id number]  Primary Cardiologist: Lamar Fitch, MD Electrophysiologist: Fonda Kitty, MD  {Click to update primary MD,subspecialty MD or APP then REFRESH:1}    History of Present Illness:   Elizabeth Olsen is a 83 y.o. female with h/o coronary artery disease s/p PCI, essential hypertension, diabetes, chronic kidney failure she is on dialysis, pulmonary hypertension, paroxysmal atrial fibrillation who is being seen today for Watchman device implant.  bleeds for all day after dialysis needle has been taking out, so basically they do not want her to be on anticoagulation   Discussed the use of AI scribe software for clinical note transcription with the patient, who gave verbal consent to proceed.  History of Present Illness     Review of systems complete and found to be negative unless listed in HPI.   EP Information / Studies Reviewed:    {EKGtoday:28818}      Echo 05/01/24:  1. Left ventricular ejection fraction, by estimation, is 50 to 55%. Left  ventricular ejection fraction by 3D volume is 50 %. The left ventricle has  low normal function. The left ventricle has no regional wall motion  abnormalities. Left ventricular  diastolic parameters are consistent with Grade III diastolic dysfunction  (restrictive). Elevated left ventricular end-diastolic pressure. The E/e'  is 21.   2. Right ventricular systolic function is low normal. The right  ventricular size is normal. There is moderately elevated pulmonary artery  systolic pressure. The estimated right ventricular systolic pressure is  59.2 mmHg.   3. Calcified structure in the right atrium measuring 1.0 x 1.74 cm,  adherent to the free wall - may be a cast from prior dialysis catheter or  port.   4. The mitral valve is degenerative. Moderate to severe mitral valve  regurgitation. Moderate mitral annular calcification.   5. The  tricuspid valve is abnormal. Tricuspid valve regurgitation is  moderate.   6. The aortic valve is tricuspid. Aortic valve regurgitation is trivial.  Aortic valve sclerosis/calcification is present, without any evidence of  aortic stenosis.   7. The inferior vena cava is normal in size with greater than 50%  respiratory variability, suggesting right atrial pressure of 3 mmHg.   Comparison(s): Changes from prior study are noted. 06/28/2023: LVEF 55-60%.   Nuclear Stress 03/25/24:    Findings are consistent with no ischemia and no infarction. The study is low risk.   No ST deviation was noted.   Left ventricular function is abnormal. Global function is mildly reduced. Nuclear stress EF: 51%. The left ventricular ejection fraction is mildly decreased (45-54%). End diastolic cavity size is normal.   Prior study available for comparison from 07/27/2017.  Risk Assessment/Calculations:    CHA2DS2-VASc Score = 6  {Confirm score is correct.  If not, click here to update score.  REFRESH note.  :1} This indicates a 9.7% annual risk of stroke. The patient's score is based upon: CHF History: 0 HTN History: 1 Diabetes History: 1 Stroke History: 0 Vascular Disease History: 1 Age Score: 2 Gender Score: 1   {This patient has a significant risk of stroke if diagnosed with atrial fibrillation.  Please consider VKA or DOAC agent for anticoagulation if the bleeding risk is acceptable.   You can also use the SmartPhrase .[email or web address] for documentation.   :[id number]} No BP recorded.  {Refresh Note OR Click here to enter BP  :1}***  Physical Exam:   VS:  There were no vitals taken for this visit.   Wt Readings from Last 3 Encounters:  03/25/24 129 lb (58.5 kg)  03/25/24 129 lb (58.5 kg)  03/18/24 129 lb 12.8 oz (58.9 kg)     GEN: Well nourished, well developed in no acute distress NECK: No JVD CARDIAC: {EPRHYTHM:28826}, no murmurs, rubs, gallops RESPIRATORY:  Clear to auscultation without  rales, wheezing or rhonchi  ABDOMEN: Soft, non-distended EXTREMITIES:  No edema; No deformity   ASSESSMENT AND PLAN:   I have seen Elizabeth Olsen in the office today who is being considered for a Watchman left atrial appendage closure device. I believe they will benefit from this procedure given their history of atrial fibrillation, CHA2DS2-VASc score of at least 6 and unadjusted ischemic stroke rate of 9.7% per year. Unfortunately, the patient is not felt to be a long term anticoagulation candidate secondary to ***. The patient's chart has been reviewed and I feel that they would be a candidate for short term oral anticoagulation after Watchman implant.   It is my belief that after undergoing a LAA closure procedure, Elizabeth Olsen will not need long term anticoagulation which eliminates anticoagulation side effects and major bleeding risk.   Procedural risks for the Watchman implant have been reviewed with the patient including a 0.5% risk of stroke, <1% risk of perforation and <1% risk of device embolization. Other risks include bleeding, vascular damage, tamponade, worsening renal function, and death. The patient understands these risk and wishes to proceed.     The published clinical data on the safety and effectiveness of WATCHMAN include but are not limited to the following: - Holmes DR, Jess BEARD, Sick P et al. for the PROTECT AF Investigators. Percutaneous closure of the left atrial appendage versus warfarin therapy for prevention of stroke in patients with atrial fibrillation: a randomised non-inferiority trial. Lancet 2009; 374: 534-42. GLENWOOD Jess BEARD, Doshi SK, Jonita VEAR Satchel D et al. on behalf of the PROTECT AF Investigators. Percutaneous Left Atrial Appendage Closure for Stroke Prophylaxis in Patients With Atrial Fibrillation 2.3-Year Follow-up of the PROTECT AF (Watchman Left Atrial Appendage System for Embolic Protection in Patients With Atrial Fibrillation) Trial. Circulation 2013;  127:720-729. - Alli O, Doshi S,  Kar S, Reddy VY, Sievert H et al. Quality of Life Assessment in the Randomized PROTECT AF (Percutaneous Closure of the Left Atrial Appendage Versus Warfarin Therapy for Prevention of Stroke in Patients With Atrial Fibrillation) Trial of Patients at Risk for Stroke With Nonvalvular Atrial Fibrillation. J Am Coll Cardiol 2013; 61:1790-8. GLENWOOD Satchel DR, Archer RAMAN, Price M, Whisenant B, Sievert H, Doshi S, Huber K, Reddy V. Prospective randomized evaluation of the Watchman left atrial appendage Device in patients with atrial fibrillation versus long-term warfarin therapy; the PREVAIL trial. Journal of the [employer] of Cardiology, Vol. 4, No. 1, 2014, 1-11. - Kar S, Doshi SK, Sadhu A, Horton R, Osorio J et al. Primary outcome evaluation of a next-generation left atrial appendage closure device: results from the PINNACLE FLX trial. Circulation 2021;143(18)1754-1762.    After today's visit with the patient which was dedicated solely for shared decision making visit regarding LAA closure device, the patient decided to proceed with the LAA appendage closure procedure scheduled to be done in the near future at [facility]. Prior to the procedure, I would like to obtain a gated CT scan of the chest with contrast timed for PV/LA visualization.   Additionally, the patient will need ***.  HAS-BLED score *** Hypertension {YES/NO:21197} Abnormal renal and liver function (Dialysis, transplant, Cr >2.26 mg/dL /Cirrhosis or Bilirubin >2x Normal or AST/ALT/AP >3x Normal) {YES/NO:21197} Stroke {YES/NO:21197} Bleeding {YES/NO:21197} Labile INR (Unstable/high INR) {YES/NO:21197} Elderly (>65) {YES/NO:21197} Drugs or alcohol (>= 8 drinks/week, anti-plt or NSAID) {YES/NO:21197}  CHA2DS2-VASc Score = 6  The patient's score is based upon: CHF History: 0 HTN History: 1 Diabetes History: 1 Stroke History: 0 Vascular Disease History: 1 Age Score: 2 Gender Score: 1   {Confirm  score is correct.  If not, click here to update score.  REFRESH note.  :1}    ASSESSMENT AND PLAN: {Select the correct AFib Diagnosis                 :[id number]}   Follow up with {EPMDS:28135::EP Team} {EPFOLLOW LE:71826}  Signed, Fonda Kitty, MD

## 2024-05-08 NOTE — Progress Notes (Signed)
   Psychometrician Note   Cognitive testing was administered to [name] by Lonell Jude, B.S. (psychometrist) under the supervision of Dr. Arthea KYM Maryland, Ph.D., ABPP, licensed psychologist on 05/08/2024. Ms. Totino did not appear overtly distressed by the testing session per behavioral observation or responses across self-report questionnaires. Rest breaks were offered.    The battery of tests administered was selected by Dr. Zachary C. Merz, Ph.D., ABPP with consideration to Ms. Nicolini's current level of functioning, the nature of her symptoms, emotional and behavioral responses during interview, level of literacy, observed level of motivation/effort, and the nature of the referral question. This battery was communicated to the psychometrist. Communication between Dr. Arthea KYM Maryland, Ph.D., ABPP and the psychometrist was ongoing throughout the evaluation and Dr. Arthea KYM Maryland, Ph.D., ABPP was immediately accessible at all times. Dr. Zachary C. Merz, Ph.D., ABPP provided supervision to the psychometrist on the date of this service to the extent necessary to assure the quality of all services provided.    Lessly E Barkalow will return within approximately 1-2 weeks for an interactive feedback session with Dr. Maryland at which time her test performances, clinical impressions, and treatment recommendations will be reviewed in detail. Ms. Mankins understands she can contact our office should she require our assistance before this time.  A total of 115 minutes of billable time were spent face-to-face with Ms. Hehir by the psychometrist. This includes both test administration and scoring time. Billing for these services is reflected in the clinical report generated by Dr. Arthea KYM Maryland, Ph.D., ABPP  This note reflects time spent with the psychometrician and does not include test scores or any clinical interpretations made by Dr. Maryland. The full report will follow in a separate note.

## 2024-05-08 NOTE — Progress Notes (Signed)
 NEUROPSYCHOLOGICAL EVALUATION [facility] Department of Neurology  Date of Evaluation: May 08, 2024  Reason for Referral:   Elizabeth Olsen is a 83 y.o. right-handed Hispanic female referred by Camie Sevin, PA-C, to characterize her current cognitive functioning and assist with diagnostic clarity and treatment planning in the context of subjective cognitive decline.   Assessment and Plan:   Clinical Impression(s): [name] pattern of performance is suggestive of diffuse cognitive impairment. An appropriate performance relative to age-matched peers was exhibited across basic attention/concentration. However, fairly severe impairment was exhibited across all other assessed cognitive domains. This includes processing speed, cognitive flexibility, expressive language, visuospatial abilities, and all aspects of learning and memory. Functionally, [name] no longer drives and her family has fully taken over all medication management, financial management, and bill paying responsibilities. Given evidence for both cognitive and functional impairment, [name] meets diagnostic criteria for a Major Neurocognitive Disorder (dementia) at the present time.  The cause for her dementia presentation is somewhat uncertain. Underlying Alzheimer's disease remains plausible. [name] was fully amnestic (i.e., 0% retention) across all memory tasks and performed poorly across recognition trials. Taken together, this suggests evidence for rapid forgetting and a pronounced storage impairment which is the hallmark testing pattern for this illness. Eventually, as this illness progresses, all cognitive domains are impacted and lead to a diffuse presentation such as what [name] exhibited.  There is also the potential for a mixed dementia presentation (i.e., Alzheimer's disease made worse by cerebrovascular disease and chronic medical conditions). Unfortunately, her recently  ordered MRI had not yet been completed at the time of the current evaluation. However, recent head CT scans did suggest underlying cerebrovascular disease of unspecified severity. Her daughter highlighted that [name]'s diabetes has been difficult to manage over time and that she is currently receiving dialysis for end stage renal disease. Overall, a mixed dementia presentation may be the most likely explanation for ongoing impairment. Continued medical monitoring will be important moving forward.   Recommendations: If desired, [name] could discuss medication aimed to address memory loss and concerns surrounding Alzheimer's disease  with [name]. It is important to highlight that these medications have been shown to slow functional decline in some individuals. There is no current treatment which can stop or reverse cognitive decline when caused by a neurodegenerative illness.   I agree with her family's decision and would also recommend that [name] fully abstain from all driving pursuits.   It will be important for [name] to have another person with her when in situations where she may need to process information, weigh the pros and cons of different options, and make decisions, in order to ensure that she fully understands and recalls all information to be considered. She will likely benefit from the establishment and maintenance of a routine in order to maximize her functional abilities over time.  If not already done, [name] and her family may want to discuss her wishes regarding durable power of attorney and medical decision making, so that she can have input into these choices. If they require legal assistance with this, long-term care resource access, or other aspects of estate planning, they could reach out to [employer] at [phone] for a free consultation. Additionally, they may wish to discuss future plans for caretaking and seek out community options for in  home/residential care should they become necessary.  [name] is encouraged to attend to lifestyle factors for brain  health (e.g., regular physical exercise, good nutrition habits and consideration of the MIND-DASH diet, regular participation in cognitively-stimulating activities, and general stress management techniques), which are likely to have benefits for both emotional adjustment and cognition. Optimal control of vascular risk factors (including safe cardiovascular exercise and adherence to dietary recommendations) is encouraged. Continued participation in activities which provide mental stimulation and social interaction is also recommended.   Important information should be provided to [name] in written format in all instances. This information should be placed in a highly frequented and easily visible location within her home to promote recall. External strategies such as written notes in a consistently used memory journal, visual and nonverbal auditory cues such as a calendar on the refrigerator or appointments with alarm, such as on a cell phone, can also help maximize recall.  To address problems with processing speed, she may wish to consider:   -Ensuring that she is alerted when essential material or instructions are being presented   -Adjusting the speed at which new information is presented   -Allowing for more time in comprehending, processing, and responding in conversation   -Repeating and paraphrasing instructions or conversations aloud  To address problems with fluctuating attention and/or executive dysfunction, she may wish to consider:   -Avoiding external distractions when needing to concentrate   -Limiting exposure to fast paced environments with multiple sensory demands   -Writing down complicated information and using checklists   -Attempting and completing one task at a time (i.e., no multi-tasking)   -Verbalizing aloud each step of a task to maintain focus    -Taking frequent breaks during the completion of steps/tasks to avoid fatigue   -Reducing the amount of information considered at one time   -Scheduling more difficult activities for a time of day where she is usually most alert  Review of Records:   Past Medical History:  Diagnosis Date   Acquired hypothyroidism 10/13/2017   Acute pyelonephritis 01/20/2024   Acute respiratory failure with hypoxia (HCC) 10/13/2017   Allergy 05/08/2023   Anemia associated with diabetes mellitus 10/13/2017   Atherosclerotic heart disease of native coronary artery without angina pectoris 05/08/2023   Blood transfusion without reported diagnosis    with breast surgery   Blue nevus 03/12/2024   Breast cancer    left      Cardiomyopathy 07/23/2017   Ejection fraction 45-50% may; from September 2018     Chronic cholecystitis with calculus 01/22/2018   Colon polyp 03/2007   adenomatous   Congestive heart failure (CHF) 04/12/2020   Constipation 06/12/2023   COVID-19 virus infection 11/20/2019   ESRD (end stage renal disease) on dialysis    HD MWF     Essential hypertension 09/11/2018   Fibrous papule of nose 03/12/2024   GERD (gastroesophageal reflux disease)    Heart murmur    as child   Hernia, incisional    Hyperlipidemia    Hypothyroidism    Iron  deficiency anemia 05/08/2023   Moderate protein-calorie malnutrition 06/28/2023   Multiple myeloma in relapse 02/14/2021   Near syncope 04/01/2018   Neuropathy    Onychodystrophy 03/12/2024   Osteoarthritis, hip, bilateral    Other disorders of phosphorus metabolism 11/08/2023   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   Paroxysmal atrial fibrillation    PONV (postoperative nausea and vomiting)    Seborrheic keratoses 03/12/2024   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   Type 1 diabetes 07/23/2017   Type 2 diabetes mellitus with chronic kidney disease  on chronic dialysis, without long-term current use of  insulin  06/11/2023   Urinary tract infection, site not specified 01/22/2024   Vertigo 06/24/2018   Vitamin D  deficiency    Xerosis of skin 03/12/2024    Past Surgical History:  Procedure Laterality Date   A/V FISTULAGRAM N/A 05/16/2023   Procedure: A/V Fistulagram;  Surgeon: Lanis Fonda BRAVO, MD;  Location: [facility] INVASIVE CV LAB;  Service: Cardiovascular;  Laterality: N/A;   A/V SHUNT INTERVENTION N/A 02/28/2024   Procedure: A/V SHUNT INTERVENTION;  Surgeon: Tobie Gordy POUR, MD;  Location: [facility] INVASIVE CV LAB;  Service: Cardiovascular;  Laterality: N/A;  70% innom   ABDOMINAL HYSTERECTOMY     APPENDECTOMY     BREAST SURGERY     CAPD REMOVAL N/A 05/31/2023   Procedure: REMOVAL CONTINUOUS AMBULATORY PERITONEAL DIALYSIS  (CAPD) CATHETER;  Surgeon: Magda Debby SAILOR, MD;  Location: [facility] OR;  Service: Vascular;  Laterality: N/A;   CHOLECYSTECTOMY  01/22/2018   LAPROSCOPIC    CHOLECYSTECTOMY N/A 01/22/2018   Procedure: LAPAROSCOPIC CHOLECYSTECTOMY WITH INTRAOPERATIVE CHOLANGIOGRAM;  Surgeon: Belinda Cough, MD;  Location: [facility] OR;  Service: General;  Laterality: N/A;   COLONOSCOPY  2013   due next 03-2017   EYE SURGERY     bil cataracts   IR THROMBECTOMY AV FISTULA W/THROMBOLYSIS/PTA INC/SHUNT/IMG RIGHT Right 09/11/2023   IR US  GUIDE VASC ACCESS RIGHT  09/11/2023   IUD REMOVAL     with appendectomy   MASTECTOMY, MODIFIED RADICAL W/RECONSTRUCTION Left 1998   10 nodes out   PERIPHERAL VASCULAR INTERVENTION  05/16/2023   Procedure: PERIPHERAL VASCULAR INTERVENTION;  Surgeon: Lanis Fonda BRAVO, MD;  Location: [facility] INVASIVE CV LAB;  Service: Cardiovascular;;   THORACENTESIS Right 05/17/2023   Procedure: THORACENTESIS;  Surgeon: Claudene Toribio BROCKS, MD;  Location: [facility] ENDOSCOPY;  Service: Pulmonary;  Laterality: Right;   TUMOR EXCISION Left    x 2, neck, head    Current Outpatient Medications:    acetaminophen  (TYLENOL ) 325 MG tablet, Take 650 mg by mouth every 6 (six) hours as needed for mild pain (pain score  1-3) (leg)., Disp: , Rfl:    amLODipine  (NORVASC ) 2.5 MG tablet, Take 1 tablet (2.5 mg total) by mouth as directed. Take at night, hold for systolic BP (top number) < 120., Disp: 90 tablet, Rfl: 3   cloNIDine  (CATAPRES ) 0.1 MG tablet, Take 1 tablet (0.1 mg total) by mouth daily as needed (FOR SYSTOLIC BLOOD PRESSURE GREATER THAN 170 AND OR DIASTOLIC FOR BLOOD PRESSURE GREATER 100)., Disp: 30 tablet, Rfl: 0   Glucagon (BAQSIMI ONE PACK) 3 MG/DOSE POWD, 1 Dose by Other route as directed., Disp: , Rfl:    Glycerin -Hypromellose-PEG 400 (DRY EYE RELIEF DROPS) 0.2-0.2-1 % SOLN, Place 1 drop into both eyes daily as needed (Dry eyes). Natural tears, Disp: , Rfl:    insulin  glargine (LANTUS  SOLOSTAR) 100 UNIT/ML Solostar Pen, Inject 8 Units into the skin every morning. And takes 2 units after each meal, Disp: , Rfl:    insulin  lispro (HUMALOG ) 100 UNIT/ML injection, Inject 1-2 Units into the skin See admin instructions. 2 units with each meal, 1 hour correction every 50 units over 200 give 1 unit, Disp: , Rfl:    levothyroxine  (SYNTHROID ) 88 MCG tablet, Take 1 tablet (88 mcg total) by mouth daily before breakfast., Disp: 90 tablet, Rfl: 3   VELPHORO 500 MG chewable tablet, Chew 500 mg by mouth with breakfast, with lunch, and with evening meal., Disp: , Rfl:    Vitamin D -Vitamin K (VITAMIN  K2-VITAMIN D3 PO), Take 1 tablet by mouth daily. 125 mg /100 mg, Disp: , Rfl:        06/19/2023    1:00 PM  Montreal Cognitive Assessment   Visuospatial/ Executive (0/5) 4  Naming (0/3) 2  Attention: Read list of digits (0/2) 2  Attention: Read list of letters (0/1) 1  Attention: Serial 7 subtraction starting at 100 (0/3) 1  Language: Repeat phrase (0/2) 0  Language : Fluency (0/1) 0  Abstraction (0/2) 1  Delayed Recall (0/5) 0  Orientation (0/6) 2  Total 13  Adjusted Score (based on education) 14   Neuroimaging: Brain MRI on 10/27/2016 was unremarkable. Head CT on 05/22/2023 in the context of a mental status  change was negative for any acute changes. It did reveal mild atrophy and chronic microvascular ischemic disease. Head CT on 05/05/2024 in the context of altered mental status was stable. An updated brain MRI had been ordered but not yet scheduled at the time of current writing.   Clinical Interview:   The following information was obtained during a clinical interview with Ms. Nyquist and her daughter prior to cognitive testing.  Cognitive Symptoms: Ms. Cummiskey denied all cognitive concerns throughout the present interview. Her daughter gently acknowledged her and family-held concerns, largely surrounding generalized short-term memory decline. Primary concerns largely surrounded rapid forgetting and increased repetition in conversation. Her daughter noted that Ms. Gutman may forget if she has already eaten and may misplace things around her residence. Her daughter also reported Ms. Risko pulling out her IVs during dialysis and not remembering that she had done this. Ms. Whitmire husband passed in 2022 and progressive memory decline has been observed by family since that time. Her daughter also noted word finding difficulties, as well as Ms. Kesselman seeming more withdrawn and not wishing to engage as much in conversation with her friends. No prominent personality changes were described. Greater confusion was noted in the evenings. No other cognitive deficits were highlighted during interview.   Difficulties completing ADLs: Endorsed. Ms. Enwright daughter and family have fully taken over all medication management, financial management, and bill paying responsibilities. Ms. Fung no longer drives. Medical records also suggest some instances where Ms. Berberian benefits from assistance in getting dressed.   Additional Medical History: History of traumatic brain injury/concussion: Denied. History of stroke: Denied. History of seizure activity: Denied. History of known exposure to toxins: Denied. Symptoms of  chronic pain: Denied. Experience of frequent headaches/migraines: Denied. Frequent instances of dizziness/vertigo: Denied.  Sensory changes: Denied. Her daughter described a notable change in Ms. Shillingford's taste preferences, especially surrounding texture.  Balance/coordination difficulties: Ms. Cercone experiences significant balance instability at times and ambulates with a rolling walker for added stability. Instability was due at least in part to neuropathy. One side of the body was not said to be worse relative to the other. No recent falls were reported.  Other motor difficulties: Denied. Medical records suggested that tremors can occur related to blood pressure elevations.   Sleep History: Estimated hours obtained each night: 6-8 hours.  Difficulties falling asleep: Denied. Difficulties staying asleep: Denied. Her daughter noted that she may have the occasional night where she wakes throughout the night with acute confusion. Some of this may be due to vivid dreaming.  Feels rested and refreshed upon awakening: Endorsed.  History of snoring: Denied. History of waking up gasping for air: Denied. Witnessed breath cessation while asleep: Denied.  History of vivid dreaming: Endorsed. Excessive movement while asleep: Denied. Instances of  acting out her dreams: Denied.  Psychiatric/Behavioral Health History: Depression: She described her mood as being positive overall. Her daughter was in agreement. They denied any prior mental health concerns or formal diagnoses to their knowledge.  Anxiety: Denied. Mania: Denied. Trauma History: Denied. Visual/auditory hallucinations: Denied. Medical records do raise concern for illusions versus hallucinations (e.g., seeing a sock on the floor and believing it is a dog).  Delusional thoughts: Denied.  Tobacco: Denied. Alcohol : She denied current alcohol  consumption as well as a history of problematic alcohol  abuse or dependence.  Recreational drugs:  Denied.  Family History: Problem Relation Age of Onset   Alcoholism Mother    Hypertension Father    Congestive Heart Failure Father    Peripheral vascular disease Father    Heart attack Brother    Dementia Maternal Grandmother        with very advanced age   Hypertension Maternal Grandmother    Stomach cancer Maternal Grandmother        GGM   Hypertension Maternal Grandfather    Diabetes Other        both sides of family   Colon cancer Neg Hx    Esophageal cancer Neg Hx    Pancreatic cancer Neg Hx    Rectal cancer Neg Hx    This information was confirmed by Ms. Kluever.  Academic/Vocational History: Highest level of educational attainment: 12 years. Ms. Hamlett was born and [place] and completed academic pursuits before coming to the [place] . Spanish is her primary language. However, she is fluent in [place] and speaks that more often than Spanish at the present time. 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  History of developmental delay: Denied. History of grade repetition: Denied. Enrollment in special education courses: Denied. History of LD/ADHD: Denied.  Employment: Retired.   Evaluation Results:   Behavioral Observations: Ms. Coover was accompanied by her daughter, arrived to her appointment on time, and was appropriately dressed and groomed. She appeared alert. She ambulated slowly and with the assistance of a rolling walker. Instability was observed but she was able to maneuver this device well. Gross motor functioning appeared intact upon informal observation and no abnormal movements (e.g., tremors) were noted. Her affect was generally relaxed and positive. Spontaneous speech was fluent and word finding difficulties were not observed during the clinical interview. Thought processes were coherent, organized, and normal in content. Insight into her cognitive difficulties appeared poor and I do not believe that Ms. Rhem has  appropriate insight to the extent of ongoing cognitive impairment.   During testing, sustained attention was generally adequate. There were times where Ms. Schlotter would look away and appear to forget that she was in the middle of a task. She benefited from redirection provided by the psychometrist. Task engagement was adequate and she persisted when challenged. She was given the opportunity to respond in Spanish across expressive language tasks if she found it difficult to come with the desired word in [place]. Overall, Ms. Andalon was cooperative with the clinical interview and subsequent testing procedures.   Adequacy of Effort: The validity of neuropsychological testing is limited by the extent to which the individual being tested may be assumed to have exerted adequate effort during testing. Ms. Rogel expressed her intention to perform to the best of her abilities and exhibited adequate task engagement and persistence. The results of the current evaluation are believed to be a valid representation of Ms. Fonder's current cognitive functioning.  Test  Results: Ms. Neuberger was poorly oriented at the time of the current evaluation. She was unable to provide her address or phone number. She was also unable to state the current year, month, date, day of the week, or time. She reported that she was currently meeting with her cardiologist and was unable to provide the state the current clinic was located in.   Intellectual abilities based upon educational and vocational attainment were estimated to be in the average range. Premorbid abilities were estimated to be within the below average range based upon a single-word reading test.   Processing speed was exceptionally low to well below average. Basic attention was average. Executive functioning (i.e., cognitive flexibility) was exceptionally low.  While not directly assessed, receptive language abilities were believed to be adequate. Ms. Gongaware did not  exhibit prominent difficulties comprehending task instructions and answered all questions asked of her appropriately. Assessed expressive language (e.g., verbal fluency and confrontation naming) was exceptionally low to well below average.     Assessed visuospatial/visuoconstructional abilities were exceptionally low to well below average. Across her drawing of a clock, she included numbers 1 through 20 and incorrectly placed the clock hands. Points were lost across her drawing of a complex figure due to a few mild visual distortions, as well as two objects being omitted entirely.    Learning (i.e., encoding) of novel verbal information was exceptionally low. Spontaneous delayed recall (i.e., retrieval) of previously learned information was also exceptionally low. Retention rates were 0% across a list learning task, 0% across a story learning task, and 0% across a figure drawing task. Performance across recognition tasks was exceptionally low to well below average, suggesting negligible evidence for information consolidation.   Results of emotional screening instruments suggested that recent symptoms of generalized anxiety were in the minimal range, while symptoms of depression were within normal limits. A screening instrument assessing recent sleep quality suggested the presence of minimal sleep dysfunction.  Table of Scores:   Note: This summary of test scores accompanies the interpretive report and should not be considered in isolation without reference to the appropriate sections in the text. Descriptors are based on appropriate normative data and may be adjusted based on clinical judgment. Terms such as Within Normal Limits and Outside Normal Limits are used when a more specific description of the test score cannot be determined.       Percentile - Normative Descriptor > 98 - Exceptionally High 91-97 - Well Above Average 75-90 - Above Average 25-74 - Average 9-24 - Below Average 2-8 - Well  Below Average < 2 - Exceptionally Low       Orientation:      Raw Score Percentile   NAB Orientation, Form 1 13/29 --- ---       Cognitive Screening:      Raw Score Percentile   SLUMS: 7/30 --- ---       RBANS, Form A: Standard Score/ Scaled Score Percentile   Total Score 50 <1 Exceptionally Low  Immediate Memory 53 <1 Exceptionally Low    List Learning 2 <1 Exceptionally Low    Story Memory 3 1 Exceptionally Low  Visuospatial/Constructional 64 1 Exceptionally Low    Figure Copy 4 2 Well Below Average    Line Orientation 9/20 <2 Exceptionally Low  Language 57 <1 Exceptionally Low    Picture Naming 8/10 3-9 Well Below Average    Semantic Fluency 1 <1 Exceptionally Low  Attention 68 2 Well Below Average    Digit Span  9 37 Average    Coding 1 <1 Exceptionally Low  Delayed Memory 40 <1 Exceptionally Low    List Recall 0/10 <2 Exceptionally Low    List Recognition 9/20 <2 Exceptionally Low    Story Recall 1 <1 Exceptionally Low    Story Recognition 6/12 5-6 Well Below Average    Figure Recall 1 <1 Exceptionally Low    Figure Recognition 1/8 <1 Exceptionally Low        Intellectual Functioning:      Standard Score Percentile   Test of Premorbid Functioning: 82 12 Below Average       Attention/Executive Function:     Trail Making Test (TMT): Raw Score (T Score) Percentile     Part A 291 secs.,  1 error (23) <1 Exceptionally Low    Part B Discontinued --- Impaired         Scaled Score Percentile   WAIS-5 Naming Speed Quantity: 4 2 Well Below Average       Language:     Verbal Fluency Test: Raw Score (Scaled Score) Percentile     Phonemic Fluency (CFL) 10 (4) 2 Well Below Average    Category Fluency 4 (2) <1 Exceptionally Low  *Based on [employer] Older Normative Studies (MOANS)          NAB Language Module, Form 1: T Score Percentile     Naming 14/31 (20) <1 Exceptionally Low       Visuospatial/Visuoconstruction:      Raw Score Percentile   Clock Drawing: 5/10 ---  Impaired       Mood and Personality:      Raw Score Percentile   PROMIS Depression Questionnaire: 8 --- None to Slight  PROMIS Anxiety Questionnaire: 9 --- None to Slight       Additional Questionnaires:      Raw Score Percentile   PROMIS Sleep Disturbance Questionnaire: 14 --- None to Slight   Informed Consent and Coding/Compliance:   The current evaluation represents a clinical evaluation for the purposes previously outlined by the referral source and is in no way reflective of a forensic evaluation.   Ms. Skiff was provided with a verbal description of the nature and purpose of the present neuropsychological evaluation. Also reviewed were the foreseeable risks and/or discomforts and benefits of the procedure, limits of confidentiality, and mandatory reporting requirements of this provider. The patient was given the opportunity to ask questions and receive answers about the evaluation. Oral consent to participate was provided by the patient.   This evaluation was conducted by Arthea KYM Maryland, Ph.D., ABPP-CN, board certified clinical neuropsychologist. Ms. Fross completed a clinical interview with Dr. Maryland, billed as one unit [phone], and 115 minutes of cognitive testing and scoring, billed as one unit [phone] and three additional units [id number]. Psychometrist Lonell Jude, B.S. assisted Dr. Maryland with test administration and scoring procedures. As a separate and discrete service, one unit [phone] and two units [phone] (160 minutes) were billed for Dr. Loralee time spent in interpretation and report writing.

## 2024-05-09 DIAGNOSIS — Z992 Dependence on renal dialysis: Secondary | ICD-10-CM | POA: Diagnosis not present

## 2024-05-09 DIAGNOSIS — N2581 Secondary hyperparathyroidism of renal origin: Secondary | ICD-10-CM | POA: Diagnosis not present

## 2024-05-09 DIAGNOSIS — N186 End stage renal disease: Secondary | ICD-10-CM | POA: Diagnosis not present

## 2024-05-10 ENCOUNTER — Emergency Department (HOSPITAL_COMMUNITY)

## 2024-05-10 ENCOUNTER — Inpatient Hospital Stay (HOSPITAL_COMMUNITY)
Admission: EM | Admit: 2024-05-10 | Discharge: 2024-05-15 | DRG: 291 | Disposition: A | Discharge status: Hospice | Attending: Internal Medicine | Admitting: Internal Medicine

## 2024-05-10 ENCOUNTER — Other Ambulatory Visit: Payer: Self-pay

## 2024-05-10 ENCOUNTER — Encounter (HOSPITAL_COMMUNITY): Payer: Self-pay

## 2024-05-10 DIAGNOSIS — Z515 Encounter for palliative care: Secondary | ICD-10-CM | POA: Diagnosis not present

## 2024-05-10 DIAGNOSIS — E039 Hypothyroidism, unspecified: Secondary | ICD-10-CM | POA: Diagnosis present

## 2024-05-10 DIAGNOSIS — F0280 Dementia in other diseases classified elsewhere without behavioral disturbance: Secondary | ICD-10-CM | POA: Diagnosis present

## 2024-05-10 DIAGNOSIS — Z882 Allergy status to sulfonamides status: Secondary | ICD-10-CM

## 2024-05-10 DIAGNOSIS — R0902 Hypoxemia: Secondary | ICD-10-CM

## 2024-05-10 DIAGNOSIS — Z66 Do not resuscitate: Secondary | ICD-10-CM | POA: Diagnosis not present

## 2024-05-10 DIAGNOSIS — Z87440 Personal history of urinary (tract) infections: Secondary | ICD-10-CM

## 2024-05-10 DIAGNOSIS — J810 Acute pulmonary edema: Secondary | ICD-10-CM | POA: Diagnosis not present

## 2024-05-10 DIAGNOSIS — I5033 Acute on chronic diastolic (congestive) heart failure: Secondary | ICD-10-CM | POA: Diagnosis not present

## 2024-05-10 DIAGNOSIS — L03211 Cellulitis of face: Secondary | ICD-10-CM | POA: Diagnosis not present

## 2024-05-10 DIAGNOSIS — Z794 Long term (current) use of insulin: Secondary | ICD-10-CM | POA: Diagnosis not present

## 2024-05-10 DIAGNOSIS — Z860101 Personal history of adenomatous and serrated colon polyps: Secondary | ICD-10-CM

## 2024-05-10 DIAGNOSIS — Z1152 Encounter for screening for COVID-19: Secondary | ICD-10-CM

## 2024-05-10 DIAGNOSIS — R109 Unspecified abdominal pain: Secondary | ICD-10-CM | POA: Diagnosis not present

## 2024-05-10 DIAGNOSIS — Z634 Disappearance and death of family member: Secondary | ICD-10-CM

## 2024-05-10 DIAGNOSIS — Z7901 Long term (current) use of anticoagulants: Secondary | ICD-10-CM

## 2024-05-10 DIAGNOSIS — I771 Stricture of artery: Secondary | ICD-10-CM | POA: Diagnosis not present

## 2024-05-10 DIAGNOSIS — Z9861 Coronary angioplasty status: Secondary | ICD-10-CM

## 2024-05-10 DIAGNOSIS — Z8616 Personal history of COVID-19: Secondary | ICD-10-CM | POA: Diagnosis not present

## 2024-05-10 DIAGNOSIS — J9601 Acute respiratory failure with hypoxia: Secondary | ICD-10-CM | POA: Diagnosis present

## 2024-05-10 DIAGNOSIS — Z853 Personal history of malignant neoplasm of breast: Secondary | ICD-10-CM

## 2024-05-10 DIAGNOSIS — E1122 Type 2 diabetes mellitus with diabetic chronic kidney disease: Secondary | ICD-10-CM | POA: Diagnosis present

## 2024-05-10 DIAGNOSIS — E8779 Other fluid overload: Secondary | ICD-10-CM | POA: Diagnosis not present

## 2024-05-10 DIAGNOSIS — R627 Adult failure to thrive: Secondary | ICD-10-CM | POA: Diagnosis present

## 2024-05-10 DIAGNOSIS — N2581 Secondary hyperparathyroidism of renal origin: Secondary | ICD-10-CM | POA: Diagnosis present

## 2024-05-10 DIAGNOSIS — R0602 Shortness of breath: Principal | ICD-10-CM

## 2024-05-10 DIAGNOSIS — J9621 Acute and chronic respiratory failure with hypoxia: Secondary | ICD-10-CM | POA: Diagnosis not present

## 2024-05-10 DIAGNOSIS — G309 Alzheimer's disease, unspecified: Secondary | ICD-10-CM | POA: Diagnosis not present

## 2024-05-10 DIAGNOSIS — E1165 Type 2 diabetes mellitus with hyperglycemia: Secondary | ICD-10-CM | POA: Diagnosis not present

## 2024-05-10 DIAGNOSIS — I429 Cardiomyopathy, unspecified: Secondary | ICD-10-CM | POA: Diagnosis not present

## 2024-05-10 DIAGNOSIS — I2510 Atherosclerotic heart disease of native coronary artery without angina pectoris: Secondary | ICD-10-CM | POA: Diagnosis present

## 2024-05-10 DIAGNOSIS — F0150 Vascular dementia without behavioral disturbance: Secondary | ICD-10-CM | POA: Diagnosis present

## 2024-05-10 DIAGNOSIS — I120 Hypertensive chronic kidney disease with stage 5 chronic kidney disease or end stage renal disease: Secondary | ICD-10-CM | POA: Diagnosis not present

## 2024-05-10 DIAGNOSIS — Z6372 Alcoholism and drug addiction in family: Secondary | ICD-10-CM

## 2024-05-10 DIAGNOSIS — Z833 Family history of diabetes mellitus: Secondary | ICD-10-CM

## 2024-05-10 DIAGNOSIS — I700 Atherosclerosis of aorta: Secondary | ICD-10-CM | POA: Diagnosis not present

## 2024-05-10 DIAGNOSIS — Z91048 Other nonmedicinal substance allergy status: Secondary | ICD-10-CM

## 2024-05-10 DIAGNOSIS — I480 Paroxysmal atrial fibrillation: Secondary | ICD-10-CM | POA: Diagnosis present

## 2024-05-10 DIAGNOSIS — I132 Hypertensive heart and chronic kidney disease with heart failure and with stage 5 chronic kidney disease, or end stage renal disease: Principal | ICD-10-CM | POA: Diagnosis present

## 2024-05-10 DIAGNOSIS — E785 Hyperlipidemia, unspecified: Secondary | ICD-10-CM | POA: Diagnosis not present

## 2024-05-10 DIAGNOSIS — S0993XA Unspecified injury of face, initial encounter: Secondary | ICD-10-CM | POA: Diagnosis not present

## 2024-05-10 DIAGNOSIS — I10 Essential (primary) hypertension: Secondary | ICD-10-CM | POA: Diagnosis not present

## 2024-05-10 DIAGNOSIS — H109 Unspecified conjunctivitis: Secondary | ICD-10-CM | POA: Diagnosis present

## 2024-05-10 DIAGNOSIS — Z880 Allergy status to penicillin: Secondary | ICD-10-CM

## 2024-05-10 DIAGNOSIS — Z811 Family history of alcohol abuse and dependence: Secondary | ICD-10-CM

## 2024-05-10 DIAGNOSIS — Z681 Body mass index (BMI) 19 or less, adult: Secondary | ICD-10-CM | POA: Diagnosis not present

## 2024-05-10 DIAGNOSIS — Z7189 Other specified counseling: Secondary | ICD-10-CM | POA: Diagnosis not present

## 2024-05-10 DIAGNOSIS — Z90710 Acquired absence of both cervix and uterus: Secondary | ICD-10-CM

## 2024-05-10 DIAGNOSIS — Z79890 Hormone replacement therapy (postmenopausal): Secondary | ICD-10-CM

## 2024-05-10 DIAGNOSIS — M160 Bilateral primary osteoarthritis of hip: Secondary | ICD-10-CM | POA: Diagnosis present

## 2024-05-10 DIAGNOSIS — R220 Localized swelling, mass and lump, head: Secondary | ICD-10-CM | POA: Diagnosis not present

## 2024-05-10 DIAGNOSIS — Z888 Allergy status to other drugs, medicaments and biological substances status: Secondary | ICD-10-CM

## 2024-05-10 DIAGNOSIS — Z9641 Presence of insulin pump (external) (internal): Secondary | ICD-10-CM | POA: Diagnosis present

## 2024-05-10 DIAGNOSIS — Z992 Dependence on renal dialysis: Secondary | ICD-10-CM | POA: Diagnosis not present

## 2024-05-10 DIAGNOSIS — N186 End stage renal disease: Secondary | ICD-10-CM | POA: Diagnosis not present

## 2024-05-10 DIAGNOSIS — D631 Anemia in chronic kidney disease: Secondary | ICD-10-CM | POA: Diagnosis present

## 2024-05-10 DIAGNOSIS — Z8249 Family history of ischemic heart disease and other diseases of the circulatory system: Secondary | ICD-10-CM

## 2024-05-10 DIAGNOSIS — Z800 Family history of malignant neoplasm of digestive organs: Secondary | ICD-10-CM

## 2024-05-10 DIAGNOSIS — Z881 Allergy status to other antibiotic agents status: Secondary | ICD-10-CM

## 2024-05-10 DIAGNOSIS — Z79899 Other long term (current) drug therapy: Secondary | ICD-10-CM

## 2024-05-10 LAB — CBC WITH DIFFERENTIAL/PLATELET
Abs Immature Granulocytes: 0.05 K/uL (ref 0.00–0.07)
Basophils Absolute: 0 K/uL (ref 0.0–0.1)
Basophils Relative: 0 %
Eosinophils Absolute: 0.1 K/uL (ref 0.0–0.5)
Eosinophils Relative: 1 %
HCT: 35.6 % — ABNORMAL LOW (ref 36.0–46.0)
Hemoglobin: 11 g/dL — ABNORMAL LOW (ref 12.0–15.0)
Immature Granulocytes: 1 %
Lymphocytes Relative: 5 %
Lymphs Abs: 0.3 K/uL — ABNORMAL LOW (ref 0.7–4.0)
MCH: 30.1 pg (ref 26.0–34.0)
MCHC: 30.9 g/dL (ref 30.0–36.0)
MCV: 97.3 fL (ref 80.0–100.0)
Monocytes Absolute: 1.1 K/uL — ABNORMAL HIGH (ref 0.1–1.0)
Monocytes Relative: 17 %
Neutro Abs: 5.2 K/uL (ref 1.7–7.7)
Neutrophils Relative %: 76 %
Platelets: 221 K/uL (ref 150–400)
RBC: 3.66 MIL/uL — ABNORMAL LOW (ref 3.87–5.11)
RDW: 15.3 % (ref 11.5–15.5)
WBC: 6.8 K/uL (ref 4.0–10.5)
nRBC: 0 % (ref 0.0–0.2)

## 2024-05-10 LAB — LACTIC ACID, PLASMA
Lactic Acid, Venous: 1.7 mmol/L (ref 0.5–1.9)
Lactic Acid, Venous: 1.3 mmol/L (ref 0.5–1.9)

## 2024-05-10 LAB — COMPREHENSIVE METABOLIC PANEL WITH GFR
ALT: 19 U/L (ref 0–44)
AST: 19 U/L (ref 15–41)
Albumin: 3 g/dL — ABNORMAL LOW (ref 3.5–5.0)
Alkaline Phosphatase: 72 U/L (ref 38–126)
Anion gap: 10 (ref 5–15)
BUN: 23 mg/dL (ref 8–23)
CO2: 32 mmol/L (ref 22–32)
Calcium: 8.5 mg/dL — ABNORMAL LOW (ref 8.9–10.3)
Chloride: 91 mmol/L — ABNORMAL LOW (ref 98–111)
Creatinine, Ser: 2.98 mg/dL — ABNORMAL HIGH (ref 0.44–1.00)
GFR, Estimated: 15 mL/min — ABNORMAL LOW (ref >60)
Glucose, Bld: 139 mg/dL — ABNORMAL HIGH (ref 70–99)
Potassium: 3.6 mmol/L (ref 3.5–5.1)
Sodium: 133 mmol/L — ABNORMAL LOW (ref 135–145)
Total Bilirubin: 0.6 mg/dL (ref 0.0–1.2)
Total Protein: 7.1 g/dL (ref 6.5–8.1)

## 2024-05-10 LAB — HEPATITIS B SURFACE ANTIGEN: Hepatitis B Surface Ag: NONREACTIVE

## 2024-05-10 LAB — TROPONIN I (HIGH SENSITIVITY)
Troponin I (High Sensitivity): 104 ng/L (ref <18)
Troponin I (High Sensitivity): 117 ng/L (ref <18)

## 2024-05-10 LAB — BRAIN NATRIURETIC PEPTIDE: B Natriuretic Peptide: 1412 pg/mL — ABNORMAL HIGH (ref 0.0–100.0)

## 2024-05-10 LAB — MRSA NEXT GEN BY PCR, NASAL: MRSA by PCR Next Gen: NOT DETECTED

## 2024-05-10 LAB — RESP PANEL BY RT-PCR (RSV, FLU A&B, COVID)  RVPGX2
Influenza A by PCR: NEGATIVE
Influenza B by PCR: NEGATIVE
Resp Syncytial Virus by PCR: NEGATIVE
SARS Coronavirus 2 by RT PCR: NEGATIVE

## 2024-05-10 LAB — GLUCOSE, CAPILLARY
Glucose-Capillary: 80 mg/dL (ref 70–99)
Glucose-Capillary: 154 mg/dL — ABNORMAL HIGH (ref 70–99)

## 2024-05-10 LAB — CBG MONITORING, ED: Glucose-Capillary: 83 mg/dL (ref 70–99)

## 2024-05-10 MED ORDER — GLYCERIN-HYPROMELLOSE-PEG 400 0.2-0.2-1 % OP SOLN: 1.0000 [drp] | Freq: Every day | OPHTHALMIC | Status: DC | PRN

## 2024-05-10 MED ORDER — AMLODIPINE BESYLATE 5 MG PO TABS
2.5000 mg | ORAL_TABLET | Freq: Every day | ORAL | Status: DC
  Administered 2024-05-10 – 2024-05-11 (×2): 2.5 mg via ORAL
  Filled 2024-05-10 (×2): qty 1

## 2024-05-10 MED ORDER — LABETALOL HCL 5 MG/ML IV SOLN: 10.0000 mg | Freq: Once | INTRAVENOUS | Status: DC

## 2024-05-10 MED ORDER — HEPARIN SODIUM (PORCINE) 5000 UNIT/ML IJ SOLN
5000.0000 [IU] | Freq: Three times a day (TID) | INTRAMUSCULAR | Status: DC
  Administered 2024-05-10 – 2024-05-12 (×6): 5000 [IU] via SUBCUTANEOUS
  Filled 2024-05-10 (×6): qty 1

## 2024-05-10 MED ORDER — INSULIN ASPART 100 UNIT/ML IJ SOLN
0.0000 [IU] | Freq: Three times a day (TID) | INTRAMUSCULAR | Status: DC
  Administered 2024-05-11: 3 [IU] via SUBCUTANEOUS

## 2024-05-10 MED ORDER — INSULIN GLARGINE-YFGN 100 UNIT/ML ~~LOC~~ SOLN
8.0000 [IU] | Freq: Every day | SUBCUTANEOUS | Status: DC
  Administered 2024-05-11: 8 [IU] via SUBCUTANEOUS
  Filled 2024-05-10: qty 0.08

## 2024-05-10 MED ORDER — LABETALOL HCL 5 MG/ML IV SOLN
10.0000 mg | INTRAVENOUS | Status: DC | PRN
  Filled 2024-05-10: qty 4

## 2024-05-10 MED ORDER — CHLORHEXIDINE GLUCONATE CLOTH 2 % EX PADS
6.0000 | MEDICATED_PAD | Freq: Every day | CUTANEOUS | Status: DC
  Administered 2024-05-11 – 2024-05-15 (×5): 6 via TOPICAL

## 2024-05-10 MED ORDER — POLYVINYL ALCOHOL 1.4 % OP SOLN
1.0000 [drp] | OPHTHALMIC | Status: DC | PRN
  Administered 2024-05-10 – 2024-05-15 (×2): 1 [drp] via OPHTHALMIC
  Filled 2024-05-10 (×2): qty 15

## 2024-05-10 MED ORDER — LEVOTHYROXINE SODIUM 88 MCG PO TABS
88.0000 ug | ORAL_TABLET | Freq: Every day | ORAL | Status: DC
Start: 2024-05-11 — End: 2024-05-16
  Administered 2024-05-11 – 2024-05-13 (×2): 88 ug via ORAL
  Filled 2024-05-10 (×3): qty 1

## 2024-05-10 NOTE — H&P (Signed)
 History and Physical    Patient: Elizabeth Olsen FMW:[id number] DOB: 1940/11/07 DOA: 05/10/2024 DOS: the patient was seen and examined on 05/10/2024 PCP: [name], MD  Patient coming from: Home  Chief Complaint:  Chief Complaint  Patient presents with   Shortness of Breath   HPI: [name] is a 83 y.o. female with medical history significant of ESRD on hemodialysis, chronic diastolic heart failure, CAD status post PCI, diabetes mellitus type 2 on insulin  pump, hyperlipidemia, paroxysmal atrial fibrillation on anticoagulation, breast cancer, recurrent UTI with history of ESBL E. coli p/w AHRF and elevated BNP c/f hypervolemia iso ESRD.  Pt has dementia and unable to provide medical history. Per daughter, pt went to OP HD center yesterday and completed a normal course of dialysis, but returned home SOB. Her SOB was so severe that she was unable to rest overnight and had multiple O2 sats in the 80s; thus, given ongoing confusion and lack of respiratory improvement the daughter brought mom to the ED for further evaluation.  In the ED, pt hypertensive and tachypneic on 6L HFNC. Labs notable for Na 133, Cr 2.98, and BNP 1412. CXR showed pulmonary congestion and b/l pleural effusions. EDP consulted nephrology for emergent HD and admitted pt to medicine for ongoing care.  Review of Systems: As mentioned in the history of present illness. All other systems reviewed and are negative. Past Medical History:  Diagnosis Date   Acquired hypothyroidism 10/13/2017   Acute pyelonephritis 01/20/2024   Acute respiratory failure with hypoxia (HCC) 10/13/2017   Allergy 05/08/2023   Anemia associated with diabetes mellitus 10/13/2017   Atherosclerotic heart disease of native coronary artery without angina pectoris 05/08/2023   Blood transfusion without reported diagnosis    with breast surgery   Blue nevus 03/12/2024   Breast cancer    left      Cardiomyopathy 07/23/2017   Ejection fraction  45-50% may; from September 2018     Chronic cholecystitis with calculus 01/22/2018   Colon polyp 03/2007   adenomatous   Congestive heart failure (CHF) 04/12/2020   Constipation 06/12/2023   COVID-19 virus infection 11/20/2019   ESRD (end stage renal disease) on dialysis    HD MWF     Essential hypertension 09/11/2018   Fibrous papule of nose 03/12/2024   GERD (gastroesophageal reflux disease)    Heart murmur    as child   Hernia, incisional    Hyperlipidemia    Hypothyroidism    Iron  deficiency anemia 05/08/2023   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   Moderate protein-calorie malnutrition 06/28/2023   Multiple myeloma in relapse 02/14/2021   Near syncope 04/01/2018   Neuropathy    Onychodystrophy 03/12/2024   Osteoarthritis, hip, bilateral    Other disorders of phosphorus metabolism 11/08/2023   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   Paroxysmal atrial fibrillation    PONV (postoperative nausea and vomiting)    Seborrheic keratoses 03/12/2024   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   Type 1 diabetes 07/23/2017   Type 2 diabetes mellitus with chronic kidney disease on chronic dialysis, without long-term current use of insulin  06/11/2023   Urinary tract infection, site not specified 01/22/2024   Vertigo 06/24/2018   Vitamin D  deficiency    Xerosis of skin 03/12/2024   Past Surgical History:  Procedure Laterality Date   A/V FISTULAGRAM N/A 05/16/2023   Procedure: A/V Fistulagram;  Surgeon: Lanis Fonda FORBES, MD;  Location: [facility] INVASIVE CV LAB;  Service: Cardiovascular;  Laterality: N/A;   A/V SHUNT INTERVENTION N/A 02/28/2024   Procedure: A/V SHUNT INTERVENTION;  Surgeon: Tobie Gordy POUR, MD;  Location: [facility] INVASIVE CV LAB;  Service: Cardiovascular;  Laterality: N/A;  70% innom   ABDOMINAL HYSTERECTOMY     APPENDECTOMY     BREAST SURGERY     CAPD REMOVAL N/A 05/31/2023   Procedure: REMOVAL CONTINUOUS AMBULATORY PERITONEAL  DIALYSIS  (CAPD) CATHETER;  Surgeon: Magda Debby SAILOR, MD;  Location: [facility] OR;  Service: Vascular;  Laterality: N/A;   CHOLECYSTECTOMY  01/22/2018   LAPROSCOPIC    CHOLECYSTECTOMY N/A 01/22/2018   Procedure: LAPAROSCOPIC CHOLECYSTECTOMY WITH INTRAOPERATIVE CHOLANGIOGRAM;  Surgeon: Belinda Cough, MD;  Location: [facility] OR;  Service: General;  Laterality: N/A;   COLONOSCOPY  2013   due next 03-2017   EYE SURGERY     bil cataracts   IR THROMBECTOMY AV FISTULA W/THROMBOLYSIS/PTA INC/SHUNT/IMG RIGHT Right 09/11/2023   IR US  GUIDE VASC ACCESS RIGHT  09/11/2023   IUD REMOVAL     with appendectomy   MASTECTOMY, MODIFIED RADICAL W/RECONSTRUCTION Left 1998   10 nodes out   PERIPHERAL VASCULAR INTERVENTION  05/16/2023   Procedure: PERIPHERAL VASCULAR INTERVENTION;  Surgeon: Lanis Fonda BRAVO, MD;  Location: [facility] INVASIVE CV LAB;  Service: Cardiovascular;;   THORACENTESIS Right 05/17/2023   Procedure: THORACENTESIS;  Surgeon: Claudene Toribio BROCKS, MD;  Location: [facility] ENDOSCOPY;  Service: Pulmonary;  Laterality: Right;   TUMOR EXCISION Left    x 2, neck, head   Social History:  reports that she has never smoked. She has never used smokeless tobacco. She reports that she does not drink alcohol  and does not use drugs.  Allergies  Allergen Reactions   Erythromycin Shortness Of Breath and Swelling    throat swelling, sob, thrashing ,    Penicillins Rash   Tape Other (See Comments)    Patient states that she prefers paper tape because other tapes tear skin.   Dextromethorphan Rash   Doxylamine-Dm Rash   Nyquil [Pseudoeph-Doxylamine-Dm-Apap] Rash   Sulfa Antibiotics Rash and Other (See Comments)    Family History  Problem Relation Age of Onset   Alcoholism Mother    Hypertension Father    Congestive Heart Failure Father    Peripheral vascular disease Father    Heart attack Brother    Dementia Maternal Grandmother        with very advanced age   Hypertension Maternal Grandmother    Stomach cancer  Maternal Grandmother        GGM   Hypertension Maternal Grandfather    Diabetes Other        both sides of family   Colon cancer Neg Hx    Esophageal cancer Neg Hx    Pancreatic cancer Neg Hx    Rectal cancer Neg Hx     Prior to Admission medications   Medication Sig Start Date End Date Taking? Authorizing Provider  acetaminophen  (TYLENOL ) 325 MG tablet Take 650 mg by mouth every 6 (six) hours as needed for mild pain (pain score 1-3) (leg).    [provider]  amLODipine  (NORVASC ) 2.5 MG tablet Take 1 tablet (2.5 mg total) by mouth as directed. Take at night, hold for systolic BP (top number) < 120. 03/18/24 06/16/24  Carlin Delon BROCKS, NP  cloNIDine  (CATAPRES ) 0.1 MG tablet Take 1 tablet (0.1 mg total) by mouth daily as needed (FOR SYSTOLIC BLOOD PRESSURE GREATER THAN 170 AND OR DIASTOLIC FOR BLOOD PRESSURE GREATER 100). 04/01/24   Krasowski, Robert J, MD  Glucagon (BAQSIMI ONE PACK) 3 MG/DOSE POWD 1 Dose by Other route as directed. 01/10/24   [provider]  Glycerin -Hypromellose-PEG 400 (DRY EYE RELIEF DROPS) 0.2-0.2-1 % SOLN Place 1 drop into both eyes daily as needed (Dry eyes). Natural tears    [provider]  insulin  glargine (LANTUS  SOLOSTAR) 100 UNIT/ML Solostar Pen Inject 8 Units into the skin every morning. And takes 2 units after each meal 01/10/24   [provider]  insulin  lispro (HUMALOG ) 100 UNIT/ML injection Inject 1-2 Units into the skin See admin instructions. 2 units with each meal, 1 hour correction every 50 units over 200 give 1 unit 01/10/24   [provider]  levothyroxine  (SYNTHROID ) 88 MCG tablet Take 1 tablet (88 mcg total) by mouth daily before breakfast. 12/19/23 12/18/24  [name], MD  VELPHORO 500 MG chewable tablet Chew 500 mg by mouth with breakfast, with lunch, and with evening meal. 09/11/23   [provider]  Vitamin D -Vitamin K (VITAMIN K2-VITAMIN D3 PO) Take 1 tablet by mouth daily. 125 mg /100 mg     [provider]    Physical Exam: Vitals:   05/10/24 1245 05/10/24 1305 05/10/24 1400 05/10/24 1418  BP: (!) 173/67 (!) 175/66 (!) 181/69   Pulse: 79 79 82   Resp: (!) 6 16 (!) 23   Temp:    99.3 F (37.4 C)  TempSrc:    Oral  SpO2: 100% 100% 100%   Weight:      Height:       General: Alert, oriented x3, resting comfortably in no acute distress HEENT: EOMI, oropharynx clear, moist mucous membranes, hearing intact Neck: Trachea midline and no gross thyromegaly Respiratory: Bibasilar rales; no wheezing Cardiovascular: Regular rate and rhythm w/o m/r/g   Data Reviewed:  Lab Results  Component Value Date   WBC 6.8 05/10/2024   HGB 11.0 (L) 05/10/2024   HCT 35.6 (L) 05/10/2024   MCV 97.3 05/10/2024   PLT 221 05/10/2024   Lab Results  Component Value Date   GLUCOSE 139 (H) 05/10/2024   CALCIUM  8.5 (L) 05/10/2024   NA 133 (L) 05/10/2024   K 3.6 05/10/2024   CO2 32 05/10/2024   CL 91 (L) 05/10/2024   BUN 23 05/10/2024   CREATININE 2.98 (H) 05/10/2024   Lab Results  Component Value Date   ALT 19 05/10/2024   AST 19 05/10/2024   ALKPHOS 72 05/10/2024   BILITOT 0.6 05/10/2024   No results found for: INR, PROTIME  Radiology: DG Chest Portable 1 View Result Date: 05/10/2024 CLINICAL DATA:  Shortness of breath and hypoxia.  Dialysis patient. EXAM: PORTABLE CHEST 1 VIEW COMPARISON:  05/05/2024 FINDINGS: The heart is enlarged but appears stable. Stable tortuosity and calcification of the thoracic aorta. Moderate vascular congestion, mild interstitial edema, small bilateral pleural effusions (right greater than left) and overlying right basilar atelectasis. No pulmonary lesions or pneumothorax. IMPRESSION: Findings consistent with CHF. Electronically Signed   By: MYRTIS Stammer M.D.   On: 05/10/2024 13:10    Assessment and Plan: 68F h/o ESRD on hemodialysis, chronic diastolic heart failure, CAD status post PCI, diabetes mellitus type 2 on insulin  pump,  hyperlipidemia, paroxysmal atrial fibrillation on anticoagulation, breast cancer, recurrent UTI with history of ESBL E. coli p/w AHRF and elevated BNP c/f hypervolemia iso ESRD.  AHRF and elevated BNP c/f hypervolemia iso ESRD -Renal consulted for emergent HD; apprec eval/recs -Wean O2 as tolerated; consider ambulatory O2 sat prior to d/c  DM2 -Semglee   8U daily + SSI TID AC prn  HTN -PTA amlodipine  2.5mg  daily  Hypothyroid -PTA synthroid    Advance Care Planning:   Code Status: Full Code   Consults: Renal   Family Communication: N/A  Severity of Illness: The appropriate patient status for this patient is INPATIENT. Inpatient status is judged to be reasonable and necessary in order to provide the required intensity of service to ensure the patient's safety. The patient's presenting symptoms, physical exam findings, and initial radiographic and laboratory data in the context of their chronic comorbidities is felt to place them at high risk for further clinical deterioration. Furthermore, it is not anticipated that the patient will be medically stable for discharge from the hospital within 2 midnights of admission.   * I certify that at the point of admission it is my clinical judgment that the patient will require inpatient hospital care spanning beyond 2 midnights from the point of admission due to high intensity of service, high risk for further deterioration and high frequency of surveillance required.*   ------- I spent 55 minutes reviewing previous labs/notes, obtaining separate history at the bedside, counseling/discussing the treatment plan outlined above, ordering medications/tests, and performing clinical documentation.  Author: Marsha Ada, MD 05/10/2024 2:55 PM  For on call review [email or web address].

## 2024-05-10 NOTE — ED Notes (Signed)
 Assuming care of pt at this time. Report from [place], [place]

## 2024-05-10 NOTE — Progress Notes (Signed)

## 2024-05-10 NOTE — Progress Notes (Signed)
   05/10/24 1220  Therapy Vitals  Pulse Rate 77  Resp 20  MEWS Score/Color  MEWS Score 0  MEWS Score Color Green  Respiratory Assessment  Assessment Type Assess only  Respiratory Pattern Regular  Chest Assessment Chest expansion symmetrical  Oxygen Therapy/Pulse Ox  O2 Device (S)  HFNC  O2 Therapy Oxygen humidified  O2 Flow Rate (L/min) 6 L/min  SpO2 100 %

## 2024-05-10 NOTE — Consult Note (Signed)
 Renal Service Consult Note [facility]  [name] 05/10/2024 Elizabeth JONETTA Fret, MD Requesting Physician: Dr. Georgina  Reason for Consult: ESRD patient with shortness of breath HPI: The patient is a 83 y.o. year-old w/ PMH as below who presented to ED this morning reporting shortness of breath.  Patient had a normal course of dialysis yesterday but returned home arm SOB.  She was unable to rest well overnight.  She is struggling also with dementia and confusion.  The daughter brought the patient to the ED for further evaluation.  In ED blood pressure 175/63, HR 79, respiratory rate 23-29, 100% O2 sat on 6 L high flow [place].  Labs showed sodium 133, CO2 32, BUN 23, creatinine 2.9.  BNP was 1412.  Lactic acid 1.7 and 1.3.  Hemoglobin 11.  WBC 6.8.  Chest x-ray showed small bilateral effusions, vascular congestion and possible early interstitial edema.  Patient is to be admitted.  We are asked to see for dialysis.   Pt seen in the ED room.  Daughter gives most of the history patient is not contributing.  Patient has been having fluid problems like this at least once or twice in the recent past.  She has been told that she gets fluid around her heart and/or in her lungs. They have been trying to get home oxygen because of her problems with fluid overload but they have not been able to set it up yet.  She has been on dialysis about 5 or 6 years.  Most recently they have been talking about end-of-life choices and were hoping that they could get her stable enough to be able to transport her to her [place] where they could see all her friends and family while she was still able to enjoy it.   ROS -n/a   Past Medical History  Past Medical History:  Diagnosis Date   Acquired hypothyroidism 10/13/2017   Acute pyelonephritis 01/20/2024   Acute respiratory failure with hypoxia (HCC) 10/13/2017   Allergy 05/08/2023   Anemia associated with diabetes mellitus 10/13/2017    Atherosclerotic heart disease of native coronary artery without angina pectoris 05/08/2023   Blood transfusion without reported diagnosis    with breast surgery   Blue nevus 03/12/2024   Breast cancer    left      Cardiomyopathy 07/23/2017   Ejection fraction 45-50% may; from September 2018     Chronic cholecystitis with calculus 01/22/2018   Colon polyp 03/2007   adenomatous   Congestive heart failure (CHF) 04/12/2020   Constipation 06/12/2023   COVID-19 virus infection 11/20/2019   ESRD (end stage renal disease) on dialysis    HD MWF     Essential hypertension 09/11/2018   Fibrous papule of nose 03/12/2024   GERD (gastroesophageal reflux disease)    Heart murmur    as child   Hernia, incisional    Hyperlipidemia    Hypothyroidism    Iron  deficiency anemia 05/08/2023   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   Moderate protein-calorie malnutrition 06/28/2023   Multiple myeloma in relapse 02/14/2021   Near syncope 04/01/2018   Neuropathy    Onychodystrophy 03/12/2024   Osteoarthritis, hip, bilateral    Other disorders of phosphorus metabolism 11/08/2023   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   Paroxysmal atrial fibrillation    PONV (postoperative nausea and vomiting)    Seborrheic keratoses 03/12/2024   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   Type 1 diabetes 07/23/2017  Type 2 diabetes mellitus with chronic kidney disease on chronic dialysis, without long-term current use of insulin  06/11/2023   Urinary tract infection, site not specified 01/22/2024   Vertigo 06/24/2018   Vitamin D  deficiency    Xerosis of skin 03/12/2024   Past Surgical History  Past Surgical History:  Procedure Laterality Date   A/V FISTULAGRAM N/A 05/16/2023   Procedure: A/V Fistulagram;  Surgeon: Lanis Fonda BRAVO, MD;  Location: [facility] INVASIVE CV LAB;  Service: Cardiovascular;  Laterality: N/A;   A/V SHUNT INTERVENTION N/A 02/28/2024   Procedure:  A/V SHUNT INTERVENTION;  Surgeon: Tobie Gordy POUR, MD;  Location: [facility] INVASIVE CV LAB;  Service: Cardiovascular;  Laterality: N/A;  70% innom   ABDOMINAL HYSTERECTOMY     APPENDECTOMY     BREAST SURGERY     CAPD REMOVAL N/A 05/31/2023   Procedure: REMOVAL CONTINUOUS AMBULATORY PERITONEAL DIALYSIS  (CAPD) CATHETER;  Surgeon: Magda Debby SAILOR, MD;  Location: [facility] OR;  Service: Vascular;  Laterality: N/A;   CHOLECYSTECTOMY  01/22/2018   LAPROSCOPIC    CHOLECYSTECTOMY N/A 01/22/2018   Procedure: LAPAROSCOPIC CHOLECYSTECTOMY WITH INTRAOPERATIVE CHOLANGIOGRAM;  Surgeon: Belinda Cough, MD;  Location: [facility] OR;  Service: General;  Laterality: N/A;   COLONOSCOPY  2013   due next 03-2017   EYE SURGERY     bil cataracts   IR THROMBECTOMY AV FISTULA W/THROMBOLYSIS/PTA INC/SHUNT/IMG RIGHT Right 09/11/2023   IR US  GUIDE VASC ACCESS RIGHT  09/11/2023   IUD REMOVAL     with appendectomy   MASTECTOMY, MODIFIED RADICAL W/RECONSTRUCTION Left 1998   10 nodes out   PERIPHERAL VASCULAR INTERVENTION  05/16/2023   Procedure: PERIPHERAL VASCULAR INTERVENTION;  Surgeon: Lanis Fonda BRAVO, MD;  Location: [facility] INVASIVE CV LAB;  Service: Cardiovascular;;   THORACENTESIS Right 05/17/2023   Procedure: THORACENTESIS;  Surgeon: Claudene Toribio BROCKS, MD;  Location: [facility] ENDOSCOPY;  Service: Pulmonary;  Laterality: Right;   TUMOR EXCISION Left    x 2, neck, head   Family History  Family History  Problem Relation Age of Onset   Alcoholism Mother    Hypertension Father    Congestive Heart Failure Father    Peripheral vascular disease Father    Heart attack Brother    Dementia Maternal Grandmother        with very advanced age   Hypertension Maternal Grandmother    Stomach cancer Maternal Grandmother        GGM   Hypertension Maternal Grandfather    Diabetes Other        both sides of family   Colon cancer Neg Hx    Esophageal cancer Neg Hx    Pancreatic cancer Neg Hx    Rectal cancer Neg Hx    Social History  reports that she  has never smoked. She has never used smokeless tobacco. She reports that she does not drink alcohol  and does not use drugs. Allergies  Allergies  Allergen Reactions   Erythromycin Shortness Of Breath and Swelling    throat swelling, sob, thrashing ,    Penicillins Rash   Tape Other (See Comments)    Patient states that she prefers paper tape because other tapes tear skin.   Dextromethorphan Rash   Doxylamine-Dm Rash   Nyquil [Pseudoeph-Doxylamine-Dm-Apap] Rash   Sulfa Antibiotics Rash and Other (See Comments)   Home medications Prior to Admission medications   Medication Sig Start Date End Date Taking? Authorizing Provider  acetaminophen  (TYLENOL ) 325 MG tablet Take 650 mg by mouth every 6 (six) hours as  needed for mild pain (pain score 1-3) (leg).   Yes [provider]  amLODipine  (NORVASC ) 2.5 MG tablet Take 1 tablet (2.5 mg total) by mouth as directed. Take at night, hold for systolic BP (top number) < 120. 03/18/24 06/16/24 Yes Carlin Delon BROCKS, NP  cloNIDine  (CATAPRES ) 0.1 MG tablet Take 1 tablet (0.1 mg total) by mouth daily as needed (FOR SYSTOLIC BLOOD PRESSURE GREATER THAN 170 AND OR DIASTOLIC FOR BLOOD PRESSURE GREATER 100). 04/01/24  Yes Krasowski, Hasel Janish J, MD  Glucagon (BAQSIMI ONE PACK) 3 MG/DOSE POWD 1 Dose by Other route as directed. 01/10/24  Yes [provider]  Glycerin -Hypromellose-PEG 400 (DRY EYE RELIEF DROPS) 0.2-0.2-1 % SOLN Place 1 drop into both eyes daily as needed (Dry eyes). Natural tears   Yes [provider]  insulin  glargine (LANTUS  SOLOSTAR) 100 UNIT/ML Solostar Pen Inject 9 Units into the skin every morning. And takes 2 units after each meal 01/10/24  Yes [provider]  insulin  lispro (HUMALOG ) 100 UNIT/ML injection Inject 1-2 Units into the skin See admin instructions. 2 units with each meal, 1 hour correction every 50 units over 200 give 1 unit 01/10/24  Yes [provider]  levothyroxine  (SYNTHROID ) 88 MCG tablet  Take 1 tablet (88 mcg total) by mouth daily before breakfast. 12/19/23 12/18/24 Yes Sagardia, Miguel Jose, MD  metoprolol  succinate (TOPROL -XL) 25 MG 24 hr tablet Take 12.5 mg by mouth daily as needed (if heart rate over 100).   Yes [provider]  Vitamin D -Vitamin K (VITAMIN K2-VITAMIN D3 PO) Take 1 tablet by mouth daily. 125 mg /100 mg   Yes [provider]     Vitals:   05/10/24 1305 05/10/24 1400 05/10/24 1418 05/10/24 1800  BP: (!) 175/66 (!) 181/69  (!) 182/111  Pulse: 79 82  94  Resp: 16 (!) 23  (!) 29  Temp:   99.3 F (37.4 C)   TempSrc:   Oral   SpO2: 100% 100%  100%  Weight:      Height:       Exam Gen alert, no distress, [place] O2 6 L No rash, cyanosis or gangrene Sclera anicteric, throat clear  No jvd or bruits Chest bilateral soft crackles 1/3 up RRR no MRG Abd soft ntnd no mass or ascites +bs GU deferred MS no joint effusions or deformity Ext trace- 1+ bilateral PT edema, no other edema Neuro is alert, nonfocal    RUE AVF+bruit   Home bp meds: Norvasc  5 mg daily Clonidine  0.1 mg as needed Metoprolol  12.5 mg daily as needed for heart rate   OP HD: [facility] MWF 4h  B400   57kg   2K bath  RUE AVF  Heparin  none Last HD 7/11, post wt 58.2kg Comes off from 0-1.5kg over , compliant Mircera 50 mcg q 2 wks, last 7/09, due 7/23    Assessment/ Plan: Acute hypoxic resp failure: likely due to vol overload in ESRD pt. Gets close to her dry wt, perhaps she is losing body wt. CXR w/ sig congestion, mild edema, effusions, rales on exam. Not in distress in ED on 6 L [place]. Plan is for HD this evening w/ max UF as tolerated.  ESRD: on HD MWF, has not missed HD. Plan HD tonight / overnight.  HTN: bp's high, is only taking norvasc  scheduled at home, also prn clonidine . Will use high BP to set UF higher for hd tonight.  Volume: mild LE edema, clear vol excess w/ rales/ CXR as above  Anemia of esrd: Hb 10-12, esa not due til 7/23. Follow.  Memory loss: hx of progressive  dementia per the family.     Myer Fret  MD [facility] 05/10/2024, 6:58 PM  Recent Labs  Lab 05/05/24 1227 05/10/24 1156  HGB 11.7* 11.0*  ALBUMIN  3.5 3.0*  CALCIUM  9.5 8.5*  CREATININE 4.53* 2.98*  K 5.1 3.6   Inpatient medications:  amLODipine   2.5 mg Oral Daily   heparin   5,000 Units Subcutaneous Q8H   insulin  aspart  0-15 Units Subcutaneous TID WC   [START ON 05/11/2024] insulin  glargine-yfgn  8 Units Subcutaneous Daily   [START ON 05/11/2024] levothyroxine   88 mcg Oral QAC breakfast    Glycerin -Hypromellose-PEG 400

## 2024-05-10 NOTE — ED Provider Notes (Signed)
 [place] EMERGENCY DEPARTMENT AT [facility] Provider Note   CSN: [id number] Arrival date & time: 05/10/24  1141     Patient presents with: Shortness of Breath   Elizabeth Olsen is a 83 y.o. female.   The history is provided by the patient and medical records. No language interpreter was used.  Shortness of Breath Severity:  Severe Duration:  2 days Timing:  Constant Progression:  Worsening Chronicity:  Recurrent Context: not URI   Relieved by:  Nothing Worsened by:  Nothing Ineffective treatments:  None tried Associated symptoms: no abdominal pain, no chest pain, no cough, no diaphoresis, no fever, no headaches, no neck pain, no rash, no sputum production, no vomiting and no wheezing        Prior to Admission medications   Medication Sig Start Date End Date Taking? Authorizing Provider  acetaminophen  (TYLENOL ) 325 MG tablet Take 650 mg by mouth every 6 (six) hours as needed for mild pain (pain score 1-3) (leg).    [provider]  amLODipine  (NORVASC ) 2.5 MG tablet Take 1 tablet (2.5 mg total) by mouth as directed. Take at night, hold for systolic BP (top number) < 120. 03/18/24 06/16/24  Carlin Delon BROCKS, NP  cloNIDine  (CATAPRES ) 0.1 MG tablet Take 1 tablet (0.1 mg total) by mouth daily as needed (FOR SYSTOLIC BLOOD PRESSURE GREATER THAN 170 AND OR DIASTOLIC FOR BLOOD PRESSURE GREATER 100). 04/01/24   Krasowski, Robert J, MD  Glucagon (BAQSIMI ONE PACK) 3 MG/DOSE POWD 1 Dose by Other route as directed. 01/10/24   [provider]  Glycerin -Hypromellose-PEG 400 (DRY EYE RELIEF DROPS) 0.2-0.2-1 % SOLN Place 1 drop into both eyes daily as needed (Dry eyes). Natural tears    [provider]  insulin  glargine (LANTUS  SOLOSTAR) 100 UNIT/ML Solostar Pen Inject 8 Units into the skin every morning. And takes 2 units after each meal 01/10/24   [provider]  insulin  lispro (HUMALOG ) 100 UNIT/ML injection Inject 1-2 Units into the skin See admin  instructions. 2 units with each meal, 1 hour correction every 50 units over 200 give 1 unit 01/10/24   [provider]  levothyroxine  (SYNTHROID ) 88 MCG tablet Take 1 tablet (88 mcg total) by mouth daily before breakfast. 12/19/23 12/18/24  [name], MD  VELPHORO 500 MG chewable tablet Chew 500 mg by mouth with breakfast, with lunch, and with evening meal. 09/11/23   [provider]  Vitamin D -Vitamin K (VITAMIN K2-VITAMIN D3 PO) Take 1 tablet by mouth daily. 125 mg /100 mg    [provider]    Allergies: Erythromycin, Penicillins, Tape, Dextromethorphan, Doxylamine-dm, Nyquil [pseudoeph-doxylamine-dm-apap], and Sulfa antibiotics    Review of Systems  Constitutional:  Negative for chills, diaphoresis, fatigue and fever.  HENT:  Negative for congestion.   Respiratory:  Positive for chest tightness and shortness of breath. Negative for cough, sputum production, wheezing and stridor.   Cardiovascular:  Positive for leg swelling (at baseline). Negative for chest pain and palpitations.  Gastrointestinal:  Negative for abdominal pain, constipation, diarrhea, nausea and vomiting.  Genitourinary:  Negative for dysuria and flank pain.  Musculoskeletal:  Negative for back pain, neck pain and neck stiffness.  Skin:  Negative for rash and wound.  Neurological:  Negative for light-headedness and headaches.  Psychiatric/Behavioral:  Negative for agitation and confusion.   All other systems reviewed and are negative.   Updated Vital Signs Pulse 79   Temp 98.2 F (36.8 C) (Oral)   Resp 13  Ht 5' 4 (1.626 m)   Wt 52.2 kg   SpO2 (!) 60%   BMI 19.74 kg/m   Physical Exam Vitals and nursing note reviewed.  Constitutional:      General: She is not in acute distress.    Appearance: She is well-developed. She is not ill-appearing, toxic-appearing or diaphoretic.  HENT:     Head: Normocephalic and atraumatic.     Right Ear: External ear normal.     Left Ear:  External ear normal.     Nose: Nose normal.     Mouth/Throat:     Mouth: Mucous membranes are moist.     Pharynx: No oropharyngeal exudate.  Eyes:     Conjunctiva/sclera: Conjunctivae normal.     Pupils: Pupils are equal, round, and reactive to light.  Cardiovascular:     Rate and Rhythm: Normal rate.  Pulmonary:     Effort: Tachypnea and respiratory distress present.     Breath sounds: No stridor. Rhonchi and rales present.  Chest:     Chest wall: No tenderness.  Abdominal:     General: There is no distension.     Tenderness: There is no abdominal tenderness. There is no rebound.  Musculoskeletal:     Cervical back: Normal range of motion and neck supple.     Right lower leg: No tenderness. Edema present.     Left lower leg: No tenderness. Edema present.  Skin:    General: Skin is warm.     Findings: No erythema or rash.  Neurological:     General: No focal deficit present.     Mental Status: She is alert.     Motor: No abnormal muscle tone.     Coordination: Coordination normal.     Deep Tendon Reflexes: Reflexes are normal and symmetric.  Psychiatric:        Mood and Affect: Mood is anxious.     (all labs ordered are listed, but only abnormal results are displayed) Labs Reviewed  CBC WITH DIFFERENTIAL/PLATELET - Abnormal; Notable for the following components:      Result Value   RBC 3.66 (*)    Hemoglobin 11.0 (*)    HCT 35.6 (*)    Lymphs Abs 0.3 (*)    Monocytes Absolute 1.1 (*)    All other components within normal limits  COMPREHENSIVE METABOLIC PANEL WITH GFR - Abnormal; Notable for the following components:   Sodium 133 (*)    Chloride 91 (*)    Glucose, Bld 139 (*)    Creatinine, Ser 2.98 (*)    Calcium  8.5 (*)    Albumin  3.0 (*)    GFR, Estimated 15 (*)    All other components within normal limits  BRAIN NATRIURETIC PEPTIDE - Abnormal; Notable for the following components:   B Natriuretic Peptide 1,412.0 (*)    All other components within normal  limits  TROPONIN I (HIGH SENSITIVITY) - Abnormal; Notable for the following components:   Troponin I (High Sensitivity) 104 (*)    All other components within normal limits  RESP PANEL BY RT-PCR (RSV, FLU A&B, COVID)  RVPGX2  LACTIC ACID, PLASMA  LACTIC ACID, PLASMA  HEMOGLOBIN A1C  TROPONIN I (HIGH SENSITIVITY)    EKG: EKG Interpretation Date/Time:  Saturday May 10 2024 11:44:00 EDT Ventricular Rate:  80 PR Interval:  157 QRS Duration:  104 QT Interval:  399 QTC Calculation: 461 R Axis:   135  Text Interpretation: Sinus rhythm Lateral infarct, old when compared to prior,  similar appearance NO STEMI Confirmed by Ginger Barefoot ([id number]) on 05/10/2024 12:00:53 PM  Radiology: DG Chest Portable 1 View Result Date: 05/10/2024 CLINICAL DATA:  Shortness of breath and hypoxia.  Dialysis patient. EXAM: PORTABLE CHEST 1 VIEW COMPARISON:  05/05/2024 FINDINGS: The heart is enlarged but appears stable. Stable tortuosity and calcification of the thoracic aorta. Moderate vascular congestion, mild interstitial edema, small bilateral pleural effusions (right greater than left) and overlying right basilar atelectasis. No pulmonary lesions or pneumothorax. IMPRESSION: Findings consistent with CHF. Electronically Signed   By: MYRTIS Stammer M.D.   On: 05/10/2024 13:10     Procedures   CRITICAL CARE Performed by: Lonni PARAS Najma Bozarth Total critical care time: 25 minutes Critical care time was exclusive of separately billable procedures and treating other patients. Critical care was necessary to treat or prevent imminent or life-threatening deterioration. Critical care was time spent personally by me on the following activities: development of treatment plan with patient and/or surrogate as well as nursing, discussions with consultants, evaluation of patient's response to treatment, examination of patient, obtaining history from patient or surrogate, ordering and performing treatments and interventions,  ordering and review of laboratory studies, ordering and review of radiographic studies, pulse oximetry and re-evaluation of patient's condition.  Medications Ordered in the ED  heparin  injection 5,000 Units (has no administration in time range)  amLODipine  (NORVASC ) tablet 2.5 mg (has no administration in time range)  Glycerin -Hypromellose-PEG 400 0.2-0.2-1 % SOLN 1 drop (has no administration in time range)  insulin  glargine-yfgn (SEMGLEE ) injection 8 Units (has no administration in time range)  levothyroxine  (SYNTHROID ) tablet 88 mcg (has no administration in time range)  insulin  aspart (novoLOG ) injection 0-15 Units (has no administration in time range)                                    Medical Decision Making Amount and/or Complexity of Data Reviewed Labs: ordered. Radiology: ordered.  Risk Decision regarding hospitalization.    Elizabeth Olsen is a 83 y.o. female with a past medical history significant for diabetes, hyperlipidemia, hypothyroidism, CHF, ESRD on dialysis MWF, mixed Alzheimer's and vascular dementia, and GERD who presents with hypoxia and shortness of breath.  According to EMS reports nursing and patient report, patient started having shortness of breath yesterday and worsened today.  Patient reportedly had oxygen saturation of 80% on room air with EMS but on arrival here her oxygen saturation was 60%.  Patient does not take ox normally.  She reports she has had full dialysis treatments.  She has had no recent fevers chills or cough.  She denies any chest pain or abdominal pain.  Denies any trauma or falls.  Reports her legs are similar edema to prior.  On my evaluation, oxygen saturations are low.  She is on nonrebreather now with oxygen saturations around 100.  Will try to de-escalate to see if she needs nasal cannula or if she will need BiPAP.  On exam her lungs did have rales and rhonchi in the bases.  Chest was nontender.  Abdomen nontender.  She does have edema in  both legs that she reports is chronic and at baseline.  Otherwise patient is resting now.  She is tachypneic but not tachycardic.  She is afebrile.  Will call nephrology from the start as anticipate she will likely need dialysis.  Will get x-ray and labs.  Anticipate admission for new hypoxia in the setting  of suspected fluid in the lungs.  1:02 PM Spoke to Dr. Susannah with nephrology who agrees the patient likely need some dialysis.  He agrees with medicine admission when workup is further along.  They will see the patient for helping manage.  Medicine will admit for further management of hypoxia and fluid overload and needing dialysis.  She was de-escalated down to 6 L nasal cannula.      Final diagnoses:  SOB (shortness of breath)  Hypoxia     Clinical Impression: 1. SOB (shortness of breath)   2. Hypoxia     Disposition: Admit  This note was prepared with assistance of Dragon voice recognition software. Occasional wrong-word or sound-a-like substitutions may have occurred due to the inherent limitations of voice recognition software.      Renesme Kerrigan, Lonni PARAS, MD 05/10/24 [phone]

## 2024-05-10 NOTE — ED Notes (Signed)
 Attempts made to charge's phone regarding pt with no answer. Pt is ready for transport at this time

## 2024-05-10 NOTE — ED Notes (Signed)
 Help get patient into a gown on the monitor did EKG shown to Dr Randie patient is resting with call bell in reach

## 2024-05-10 NOTE — ED Notes (Signed)
 Transport made aware

## 2024-05-10 NOTE — ED Notes (Signed)
 Pts O2 was 60% on room air, Pt placed on NRB and O2 was 80%

## 2024-05-10 NOTE — ED Triage Notes (Signed)
 Pt here for [facility] since last night. Last full tx of dialysis was yesterday.  80% on room air. Lower rhonchi. No obvious edema. Denies CP

## 2024-05-11 DIAGNOSIS — J9601 Acute respiratory failure with hypoxia: Secondary | ICD-10-CM | POA: Diagnosis not present

## 2024-05-11 LAB — CBC
HCT: 36.3 % (ref 36.0–46.0)
Hemoglobin: 11.2 g/dL — ABNORMAL LOW (ref 12.0–15.0)
MCH: 29.2 pg (ref 26.0–34.0)
MCHC: 30.9 g/dL (ref 30.0–36.0)
MCV: 94.8 fL (ref 80.0–100.0)
Platelets: 235 K/uL (ref 150–400)
RBC: 3.83 MIL/uL — ABNORMAL LOW (ref 3.87–5.11)
RDW: 15.1 % (ref 11.5–15.5)
WBC: 10.7 K/uL — ABNORMAL HIGH (ref 4.0–10.5)
nRBC: 0 % (ref 0.0–0.2)

## 2024-05-11 LAB — BASIC METABOLIC PANEL WITH GFR
Anion gap: 15 (ref 5–15)
BUN: 17 mg/dL (ref 8–23)
CO2: 25 mmol/L (ref 22–32)
Calcium: 8.9 mg/dL (ref 8.9–10.3)
Chloride: 93 mmol/L — ABNORMAL LOW (ref 98–111)
Creatinine, Ser: 2.64 mg/dL — ABNORMAL HIGH (ref 0.44–1.00)
GFR, Estimated: 18 mL/min — ABNORMAL LOW (ref >60)
Glucose, Bld: 186 mg/dL — ABNORMAL HIGH (ref 70–99)
Potassium: 3.7 mmol/L (ref 3.5–5.1)
Sodium: 133 mmol/L — ABNORMAL LOW (ref 135–145)

## 2024-05-11 LAB — GLUCOSE, CAPILLARY
Glucose-Capillary: 186 mg/dL — ABNORMAL HIGH (ref 70–99)
Glucose-Capillary: 236 mg/dL — ABNORMAL HIGH (ref 70–99)
Glucose-Capillary: 264 mg/dL — ABNORMAL HIGH (ref 70–99)
Glucose-Capillary: 183 mg/dL — ABNORMAL HIGH (ref 70–99)

## 2024-05-11 MED ORDER — INSULIN GLARGINE-YFGN 100 UNIT/ML ~~LOC~~ SOLN
4.0000 [IU] | Freq: Every day | SUBCUTANEOUS | Status: DC
  Administered 2024-05-12 – 2024-05-13 (×2): 4 [IU] via SUBCUTANEOUS
  Filled 2024-05-11 (×3): qty 0.04

## 2024-05-11 MED ORDER — FENTANYL CITRATE PF 50 MCG/ML IJ SOSY
25.0000 ug | PREFILLED_SYRINGE | INTRAMUSCULAR | Status: DC | PRN
  Administered 2024-05-11 – 2024-05-13 (×3): 25 ug via INTRAVENOUS
  Filled 2024-05-11 (×3): qty 1

## 2024-05-11 MED ORDER — CIPROFLOXACIN HCL 0.3 % OP SOLN
2.0000 [drp] | Freq: Four times a day (QID) | OPHTHALMIC | Status: DC
  Administered 2024-05-11 – 2024-05-15 (×16): 2 [drp] via OPHTHALMIC
  Filled 2024-05-11: qty 2.5

## 2024-05-11 MED ORDER — AMLODIPINE BESYLATE 5 MG PO TABS: 2.5000 mg | ORAL_TABLET | Freq: Every evening | ORAL | Status: DC

## 2024-05-11 MED ORDER — INSULIN ASPART 100 UNIT/ML IJ SOLN
0.0000 [IU] | Freq: Three times a day (TID) | INTRAMUSCULAR | Status: DC
  Administered 2024-05-11: 3 [IU] via SUBCUTANEOUS
  Administered 2024-05-12 (×2): 2 [IU] via SUBCUTANEOUS
  Administered 2024-05-13: 6 [IU] via SUBCUTANEOUS
  Administered 2024-05-13: 4 [IU] via SUBCUTANEOUS
  Administered 2024-05-13 – 2024-05-14 (×3): 6 [IU] via SUBCUTANEOUS
  Administered 2024-05-14 – 2024-05-15 (×2): 5 [IU] via SUBCUTANEOUS
  Administered 2024-05-15 (×2): 6 [IU] via SUBCUTANEOUS

## 2024-05-11 NOTE — Progress Notes (Signed)
 The patient triggered a RED MEWS alert due to elevated blood pressure, elevated respiratory rate, and tachycardia. She presented with shortness of breath secondary to volume overload while awaiting dialysis. The covering hospitalist, Dr. Marcene, and the charge RN Ivy) were notified. New medical orders were initiated to manage the patient's blood pressure and heart rate until dialysis could be completed. Although the RED MEWS protocol was activated, continuous monitoring was limited as the patient was transferred for dialysis. The patient will be re-evaluated upon her return.    05/10/24 2340  Assess: MEWS Score  Temp 98.2 F (36.8 C)  BP (!) 193/82  MAP (mmHg) 115  Pulse Rate (!) 111  ECG Heart Rate (!) 111  Resp (!) 26  Level of Consciousness Alert  SpO2 100 %  O2 Device HFNC  O2 Flow Rate (L/min) 5 L/min  Assess: MEWS Score  MEWS Temp 0  MEWS Systolic 0  MEWS Pulse 2  MEWS RR 2  MEWS LOC 0  MEWS Score 4  MEWS Score Color Red  Assess: if the MEWS score is Yellow or Red  Were vital signs accurate and taken at a resting state? Yes  Does the patient meet 2 or more of the SIRS criteria? Yes  Does the patient have a confirmed or suspected source of infection? No  MEWS guidelines implemented  Yes, red  Treat  MEWS Interventions Considered administering scheduled or prn medications/treatments as ordered  Take Vital Signs  Increase Vital Sign Frequency  Red: Q1hr x2, continue Q4hrs until patient remains green for 12hrs  Escalate  MEWS: Escalate Red: Discuss with charge nurse and notify provider. Consider notifying RRT. If remains red for 2 hours consider need for higher level of care  Notify: Charge Nurse/RN  Name of Charge Nurse/RN Notified [facility]  Provider Notification  Provider Name/Title Marcene, MD  Date Provider Notified 05/10/24  Time Provider Notified 2340  Method of Notification Page  Notification Reason Other (Comment);Change in status (RED MEWS)  Provider response  See new orders  Date of Provider Response 05/10/24  Time of Provider Response 2345  Notify: Rapid Response  Name of Rapid Response RN Notified  (No need, pt is going dialysis)  Assess: SIRS CRITERIA  SIRS Temperature  0  SIRS Respirations  1  SIRS Pulse 1  SIRS WBC 0  SIRS Score Sum  2

## 2024-05-11 NOTE — Evaluation (Signed)
 Physical Therapy Evaluation Patient Details Name: Elizabeth Olsen MRN: [id number] DOB: 03/17/1941 Today's Date: 05/11/2024  History of Present Illness  83 y.o. female admitted  7/12 for Acute respiratory failure with hypoxia,  Volume overload due to ESRD,  Bilateral pleural effusion,  Acute on chronic diastolic CHF,    PMH: ESRD on hemodialysis, chronic diastolic heart failure, CAD status post PCI, diabetes mellitus type 2 on insulin  pump, hyperlipidemia, paroxysmal atrial fibrillation on anticoagulation, breast cancer, recurrent UTI with history of ESBL E. coli p/w AHRF and elevated BNP c/f hypervolemia iso ESRD.   Clinical Impression  Pt admitted with above diagnosis. 2nd follow-up this afternoon as pt was needing to rest on my first attempt. Hx taken from daughter during my first visit and reports pt was needing to be pushed in the wheelchair throughout home over the past week, but prior to this was ambulatory with a walker. Dtr reports pt having spontaneous LE buckling PTA. Patient was agreeable to therapy evaluation on my second visit today and was able to rise to edge of bed with a CGA, HOB elevated and use of rail. Min assist for balance to stand, and take several steps forward and backwards with hand held support. No overt buckling noted with light activity. SpO2 99-100% on 2L throughout session. No dyspnea. Suspect pt would do well with HHPT and family support as already arranged but may benefit from SNF if family feels they cannot adequately care for her. She seems to be making some functional progress compared to how much help she was needing over this past week. Will follow acutely and appreciate palliative goals after their meeting. Pt currently with functional limitations due to the deficits listed below (see PT Problem List). Pt will benefit from acute skilled PT to increase their independence and safety with mobility to allow discharge.           If plan is discharge home, recommend the  following: A little help with walking and/or transfers;A little help with bathing/dressing/bathroom;Assistance with cooking/housework;Direct supervision/assist for medications management;Direct supervision/assist for financial management;Assist for transportation;Supervision due to cognitive status   Can travel by private vehicle        Equipment Recommendations None recommended by PT  Recommendations for Other Services       Functional Status Assessment Patient has had a recent decline in their functional status and demonstrates the ability to make significant improvements in function in a reasonable and predictable amount of time.     Precautions / Restrictions Precautions Precautions: Fall Recall of Precautions/Restrictions: Impaired Precaution/Restrictions Comments: Monitor O2 Restrictions Weight Bearing Restrictions Per Provider Order: No      Mobility  Bed Mobility Overal bed mobility: Needs Assistance Bed Mobility: Supine to Sit, Sit to Supine     Supine to sit: Contact guard, HOB elevated, Used rails Sit to supine: Min assist, HOB elevated   General bed mobility comments: CGA to rise to EOB using rail with HOB elevated, extra time to complete. Min assist to return to supine.    Transfers Overall transfer level: Needs assistance Equipment used: None Transfers: Sit to/from Stand Sit to Stand: Min assist           General transfer comment: Min assist for balance, bracing back of legs on bed for support. Tactile cues for anterior weight shift, fair power up to stand and control of descent into bed.    Ambulation/Gait Ambulation/Gait assistance: Min assist Gait Distance (Feet): 4 Feet Assistive device: 1 person hand held assist Gait  Pattern/deviations: Step-through pattern, Decreased stride length, Trunk flexed, Narrow base of support, Shuffle Gait velocity: dec Gait velocity interpretation: <1.31 ft/sec, indicative of household ambulator   General Gait  Details: Hand held support with min assist for stability, able to step forward and backwards towards the sink a couple of times. placed opposite hand on sink with cues cues for upright stance and anterior weight shift due to posterior lean. No overt buckling noted. SpO2 99% on 2L.  Stairs            Wheelchair Mobility     Tilt Bed    Modified Rankin (Stroke Patients Only)       Balance Overall balance assessment: Needs assistance Sitting-balance support: No upper extremity supported, Feet supported Sitting balance-Leahy Scale: Fair     Standing balance support: Single extremity supported, Reliant on assistive device for balance Standing balance-Leahy Scale: Poor                               Pertinent Vitals/Pain Pain Assessment Pain Assessment: No/denies pain    Home Living Family/patient expects to be discharged to:: Private residence Living Arrangements: Children (and grandchildren) Available Help at Discharge: Family;Available 24 hours/day;Personal care attendant Type of Home: Other(Comment) (Condo) Home Access: Elevator       Home Layout: One level Home Equipment: Pharmacist, hospital (2 wheels);Rollator (4 wheels);Wheelchair - manual;Grab bars - toilet;Grab bars - tub/shower      Prior Function Prior Level of Function : Needs assist             Mobility Comments: Was ambulating with walker until about a week ago. Now LEs give out spontaneously and family is using w/c to push her room to room. ADLs Comments: Requires assist with bathing and toileting.     Extremity/Trunk Assessment   Upper Extremity Assessment Upper Extremity Assessment: Defer to OT evaluation    Lower Extremity Assessment Lower Extremity Assessment: Generalized weakness       Communication   Communication Communication: No apparent difficulties    Cognition Arousal: Alert Behavior During Therapy: WFL for tasks assessed/performed   PT - Cognitive  impairments: Awareness, Memory, Attention, Initiation, Sequencing, Problem solving                         Following commands: Impaired Following commands impaired: Follows one step commands inconsistently, Follows one step commands with increased time     Cueing Cueing Techniques: Verbal cues, Gestural cues, Tactile cues     General Comments General comments (skin integrity, edema, etc.): SpO2 99-100% on 2L throughout session. Denies dizziness or SOB with gentle activities.    Exercises General Exercises - Lower Extremity Ankle Circles/Pumps: AROM, Both, 10 reps, Supine Quad Sets: Strengthening, Both, 10 reps, Supine Gluteal Sets: Strengthening, Both, 10 reps, Supine   Assessment/Plan    PT Assessment Patient needs continued PT services  PT Problem List Decreased strength;Decreased range of motion;Decreased activity tolerance;Decreased balance;Decreased mobility;Decreased cognition;Decreased knowledge of use of DME;Cardiopulmonary status limiting activity       PT Treatment Interventions DME instruction;Gait training;Functional mobility training;Therapeutic activities;Therapeutic exercise;Balance training;Neuromuscular re-education;Cognitive remediation;Patient/family education;Wheelchair mobility training    PT Goals (Current goals can be found in the Care Plan section)  Acute Rehab PT Goals Patient Stated Goal: Palliative meeting PT Goal Formulation: With patient/family Time For Goal Achievement: 05/25/24 Potential to Achieve Goals: Fair    Frequency Min 2X/week  Co-evaluation               AM-PAC PT 6 Clicks Mobility  Outcome Measure Help needed turning from your back to your side while in a flat bed without using bedrails?: A Little Help needed moving from lying on your back to sitting on the side of a flat bed without using bedrails?: A Little Help needed moving to and from a bed to a chair (including a wheelchair)?: A Little Help needed  standing up from a chair using your arms (e.g., wheelchair or bedside chair)?: A Little Help needed to walk in hospital room?: A Little Help needed climbing 3-5 steps with a railing? : A Lot 6 Click Score: 17    End of Session Equipment Utilized During Treatment: Oxygen Activity Tolerance: Patient limited by fatigue Patient left: in bed;with call bell/phone within reach;with bed alarm set   PT Visit Diagnosis: Unsteadiness on feet (R26.81);Other abnormalities of gait and mobility (R26.89);Muscle weakness (generalized) (M62.81);Other symptoms and signs involving the nervous system (R29.898)    Time: 8591-8578 PT Time Calculation (min) (ACUTE ONLY): 13 min   Charges:   PT Evaluation $PT Eval Low Complexity: 1 Low   PT General Charges $$ ACUTE PT VISIT: 1 Visit         Leontine Roads, PT, DPT [facility] Services Physical Therapist Office: [phone] Website: [email or web address]   Leontine GORMAN Roads 05/11/2024, 2:32 PM

## 2024-05-11 NOTE — Progress Notes (Signed)
 Patient O2 sat dropping to 88% on RA. Requiring 2L of O2 [place] O2 sat 98%.

## 2024-05-11 NOTE — Progress Notes (Signed)

## 2024-05-11 NOTE — Progress Notes (Signed)
  [facility] Progress Note   Subjective: Dialysis overnight with 4L UF.  Remains on 2L [place]. Still with some increased wob.  Daughter at bedside. Hasn't been eating much, losing weight.   Objective Vitals:   05/11/24 0600 05/11/24 0800 05/11/24 0900 05/11/24 1208  BP: (!) 141/58 139/65  (!) 121/58  Pulse: 95 89  89  Resp: (!) 22 20  20   Temp: 98.3 F (36.8 C) 98.3 F (36.8 C)  98.6 F (37 C)  TempSrc: Oral Oral  Oral  SpO2: 97% 98% (!) 89% 93%  Weight:      Height:        Additional Objective Labs: Basic Metabolic Panel: Recent Labs  Lab 05/05/24 1227 05/10/24 1156 05/11/24 0743  NA 133* 133* 133*  K 5.1 3.6 3.7  CL 89* 91* 93*  CO2 27 32 25  GLUCOSE 242* 139* 186*  BUN 69* 23 17  CREATININE 4.53* 2.98* 2.64*  CALCIUM  9.5 8.5* 8.9   CBC: Recent Labs  Lab 05/05/24 1227 05/10/24 1156 05/11/24 0743  WBC 12.2* 6.8 10.7*  NEUTROABS  --  5.2  --   HGB 11.7* 11.0* 11.2*  HCT 36.2 35.6* 36.3  MCV 93.5 97.3 94.8  PLT 137* 221 235   Blood Culture    Component Value Date/Time   SDES URINE, CLEAN CATCH 01/21/2024 0758   SPECREQUEST  01/21/2024 0758    NONE Performed at [facility], [address] [place], [place] [address]    CULT >=100,000 COLONIES/mL KLEBSIELLA PNEUMONIAE (A) 01/21/2024 0758   REPTSTATUS 01/23/2024 FINAL 01/21/2024 0758    Physical Exam General: Alert, nad, on nasal oxygen  Heart: RRR Lungs: Diminished anteriorly  Abdomen: nontender Extremities: Trace LE edema  Dialysis Access: RUE AVF +bruit   Medications:   [START ON 05/12/2024] amLODipine   2.5 mg Oral QPM   Chlorhexidine  Gluconate Cloth  6 each Topical Q0600   ciprofloxacin   2 drop Both Eyes QID   heparin   5,000 Units Subcutaneous Q8H   insulin  aspart  0-6 Units Subcutaneous TID WC   [START ON 05/12/2024] insulin  glargine-yfgn  4 Units Subcutaneous Daily   levothyroxine   88 mcg Oral QAC breakfast    Dialysis Orders: [facility] MWF 4h  B400   57kg   2K bath  RUE  AVF  Heparin  none Last HD 7/11, post wt 58.2kg Comes off from 0-1.5kg over , compliant Mircera 50 mcg q 2 wks, last 7/09, due 7/23  Assessment/Plan: Acute hypoxic resp failure: likely due to vol overload in ESRD pt. Appears she is losing body weight. CXR w/ sig congestion, mild edema, effusions, rales on exam. Had HD overnight and tolerated 4L UF.  ESRD: on HD MWF. Cont on schedule. Next HD Monday. 1st shift.  HTN: BP better post HD. On low dose amlodipine .  Volume: Volume excess on admit. Still with O2 requirement; Push UF with HD. Lower EDW at discharge.  Anemia. Hb 10-11. ESA not due til 7/23. Follow. Afib. Metoprolol  prn. No AC.   FTT/Deconditioning/Memory loss. DNR. Palliative care consult for GOC   Shenouda Genova Ronnald Acosta PA-C [facility] 05/11/2024,1:44 PM

## 2024-05-11 NOTE — Progress Notes (Signed)
 [facility] night cross cover note:   Per the patient's family's request, I have placed an order for palliative care consult for assistance with goals of care clarification.    Eva Pore, DO Hospitalist

## 2024-05-11 NOTE — Plan of Care (Signed)
  Problem: Education: Goal: Ability to describe self-care measures that may prevent or decrease complications (Diabetes Survival Skills Education) will improve Outcome: Progressing   Problem: Coping: Goal: Ability to adjust to condition or change in health will improve Outcome: Progressing   Problem: Fluid Volume: Goal: Ability to maintain a balanced intake and output will improve Outcome: Progressing   Problem: Health Behavior/Discharge Planning: Goal: Ability to manage health-related needs will improve Outcome: Not Progressing   Problem: Metabolic: Goal: Ability to maintain appropriate glucose levels will improve Outcome: Progressing   Problem: Nutritional: Goal: Maintenance of adequate nutrition will improve Outcome: Progressing Goal: Progress toward achieving an optimal weight will improve Outcome: Progressing   Problem: Skin Integrity: Goal: Risk for impaired skin integrity will decrease Outcome: Progressing   Problem: Tissue Perfusion: Goal: Adequacy of tissue perfusion will improve Outcome: Progressing   Problem: Health Behavior/Discharge Planning: Goal: Ability to manage health-related needs will improve Outcome: Progressing   Problem: Clinical Measurements: Goal: Ability to maintain clinical measurements within normal limits will improve Outcome: Progressing Goal: Will remain free from infection Outcome: Progressing Goal: Diagnostic test results will improve Outcome: Progressing Goal: Respiratory complications will improve Outcome: Progressing Goal: Cardiovascular complication will be avoided Outcome: Progressing   Problem: Activity: Goal: Risk for activity intolerance will decrease Outcome: Progressing   Problem: Nutrition: Goal: Adequate nutrition will be maintained Outcome: Progressing   Problem: Coping: Goal: Level of anxiety will decrease Outcome: Progressing   Problem: Pain Managment: Goal: General experience of comfort will improve and/or  be controlled Outcome: Progressing   Problem: Safety: Goal: Ability to remain free from injury will improve Outcome: Progressing   Problem: Skin Integrity: Goal: Risk for impaired skin integrity will decrease Outcome: Progressing

## 2024-05-11 NOTE — Progress Notes (Signed)
 PROGRESS NOTE    Elizabeth Olsen  FMW:[id number] DOB: 01/11/1941 DOA: 05/10/2024 PCP: [name], MD   Chief Complaint  Patient presents with   Shortness of Breath    Brief Narrative:  Elizabeth Olsen is a 83 y.o. female with medical history significant of ESRD on hemodialysis, chronic diastolic heart failure, CAD status post PCI, diabetes mellitus type 2 on insulin  pump, hyperlipidemia, paroxysmal atrial fibrillation on anticoagulation, breast cancer, recurrent UTI with history of ESBL E. coli p/w AHRF and elevated BNP c/f hypervolemia iso ESRD.   Pt has dementia and unable to provide medical history. Per daughter, pt went to OP HD center yesterday and completed a normal course of dialysis, but returned home SOB. Her SOB was so severe that she was unable to rest overnight and had multiple O2 sats in the 80s; thus, given ongoing confusion and lack of respiratory improvement the daughter brought mom to the ED for further evaluation.   In the ED, pt hypertensive and tachypneic on 6L HFNC. Labs notable for Na 133, Cr 2.98, and BNP 1412. CXR showed pulmonary congestion and b/l pleural effusions. EDP consulted nephrology for emergent HD and admitted pt to medicine for ongoing care.    Assessment & Plan:   Principal Problem:   Acute hypoxemic respiratory failure (HCC)  Acute respiratory failure with hypoxia Volume overload due to ESRD Bilateral pleural effusion Acute on chronic diastolic CHF - She presents with increased work of breathing, imaging significant for volume overload, elevated BNP. - Requiring 6 L on admission, this morning 2 to 3 L oxygen, now drops to 85% on room air. - Volume management with dialysis, she received urgent dialysis this a.m.SABRA - Will likely need home oxygen on discharge    DM2 - Daughter is concerned about hypoglycemic event as patient becomes symptomatic with any CBGs less than 100, so I will decrease her Semglee  from 8 to 40 units daily, and keep  her on very sensitive/ESRD insulin  sliding scale.   HTN -PTA amlodipine  2.5mg  daily   Hypothyroid -PTA synthroid   Paroxysmal A-fib -On as needed metoprolol  -Off Eliquis  for last 5 months as discussed with the daughter, as she is high risk for fall, and has significant bleeding once her AV fistula deaccessed) as daughter reports mother was going the bandage too soon after dialysis, it is certainly clear risks outweighed benefits of anticoagulation)-   Conjunctivitis - Continue with Cipro  ophthalmic  Failure to thrive Deconditioning Goals of care - Extremely frail, deconditioned, will consult PT OT, family understand how tenuous her status is, they are just hoping that she will be able to get to [place] at the end of her life, daughter confirms DNR status, no heroics, and they will look quest palliative care consult guarding goals of care discussion.  DVT prophylaxis: Heparin  Code Status: DNR Family Communication: Daughter at bedsdie  Status is: Inpatient    Consultants:  Renal  pallitive   Subjective:  Still frail, shortness of breath, desaturated in the low 80s on room air  Objective: Vitals:   05/11/24 0401 05/11/24 0422 05/11/24 0600 05/11/24 0800  BP:  (!) 148/56 (!) 141/58 139/65  Pulse: (!) 101 (!) 104 95 89  Resp: (!) 22 20 (!) 22 20  Temp:  98.4 F (36.9 C) 98.3 F (36.8 C) 98.3 F (36.8 C)  TempSrc:  Oral Oral Oral  SpO2: 92% 100% 97% 98%  Weight:      Height:        Intake/Output Summary (  Last 24 hours) at 05/11/2024 1241 Last data filed at 05/11/2024 0422 Gross per 24 hour  Intake --  Output 4000 ml  Net -4000 ml   Filed Weights   05/10/24 1143  Weight: 52.2 kg    Examination:  Awake Alert, pleasant, demented with impaired judgment and insight  Decreased air entry bilaterally, with crackles at the bases Regular +ve B.Sounds, Abd Soft,  +1 edema    Data Reviewed: I have personally reviewed following labs and imaging  studies  CBC: Recent Labs  Lab 05/05/24 1227 05/10/24 1156 05/11/24 0743  WBC 12.2* 6.8 10.7*  NEUTROABS  --  5.2  --   HGB 11.7* 11.0* 11.2*  HCT 36.2 35.6* 36.3  MCV 93.5 97.3 94.8  PLT 137* 221 235    Basic Metabolic Panel: Recent Labs  Lab 05/05/24 1227 05/10/24 1156 05/11/24 0743  NA 133* 133* 133*  K 5.1 3.6 3.7  CL 89* 91* 93*  CO2 27 32 25  GLUCOSE 242* 139* 186*  BUN 69* 23 17  CREATININE 4.53* 2.98* 2.64*  CALCIUM  9.5 8.5* 8.9    GFR: Estimated Creatinine Clearance: 13.5 mL/min (A) (by C-G formula based on SCr of 2.64 mg/dL (H)).  Liver Function Tests: Recent Labs  Lab 05/05/24 1227 05/10/24 1156  AST 25 19  ALT 19 19  ALKPHOS 81 72  BILITOT 0.8 0.6  PROT 7.3 7.1  ALBUMIN  3.5 3.0*    CBG: Recent Labs  Lab 05/10/24 1746 05/10/24 2107 05/10/24 2302 05/11/24 0808 05/11/24 1208  GLUCAP 83 80 154* 186* 236*     Recent Results (from the past 240 hours)  Resp panel by RT-PCR (RSV, Flu A&B, Covid) Anterior Nasal Swab     Status: None   Collection Time: 05/05/24 12:29 PM   Specimen: Anterior Nasal Swab  Result Value Ref Range Status   SARS Coronavirus 2 by RT PCR NEGATIVE NEGATIVE Final   Influenza A by PCR NEGATIVE NEGATIVE Final   Influenza B by PCR NEGATIVE NEGATIVE Final    Comment: (NOTE) The Xpert Xpress SARS-CoV-2/FLU/RSV plus assay is intended as an aid in the diagnosis of influenza from Nasopharyngeal swab specimens and should not be used as a sole basis for treatment. Nasal washings and aspirates are unacceptable for Xpert Xpress SARS-CoV-2/FLU/RSV testing.  Fact Sheet for Patients: BloggerCourse.com  Fact Sheet for Healthcare Providers: SeriousBroker.it  This test is not yet approved or cleared by the [place]  FDA and has been authorized for detection and/or diagnosis of SARS-CoV-2 by FDA under an Emergency Use Authorization (EUA). This EUA will remain in effect  (meaning this test can be used) for the duration of the COVID-19 declaration under Section 564(b)(1) of the Act, 21 U.S.C. section 360bbb-3(b)(1), unless the authorization is terminated or revoked.     Resp Syncytial Virus by PCR NEGATIVE NEGATIVE Final    Comment: (NOTE) Fact Sheet for Patients: BloggerCourse.com  Fact Sheet for Healthcare Providers: SeriousBroker.it  This test is not yet approved or cleared by the [place]  FDA and has been authorized for detection and/or diagnosis of SARS-CoV-2 by FDA under an Emergency Use Authorization (EUA). This EUA will remain in effect (meaning this test can be used) for the duration of the COVID-19 declaration under Section 564(b)(1) of the Act, 21 U.S.C. section 360bbb-3(b)(1), unless the authorization is terminated or revoked.  Performed at [facility], [address] [place], [place] [address]   Resp panel by RT-PCR (RSV, Flu A&B, Covid) Anterior Nasal Swab  Status: None   Collection Time: 05/10/24 11:57 AM   Specimen: Anterior Nasal Swab  Result Value Ref Range Status   SARS Coronavirus 2 by RT PCR NEGATIVE NEGATIVE Final   Influenza A by PCR NEGATIVE NEGATIVE Final   Influenza B by PCR NEGATIVE NEGATIVE Final    Comment: (NOTE) The Xpert Xpress SARS-CoV-2/FLU/RSV plus assay is intended as an aid in the diagnosis of influenza from Nasopharyngeal swab specimens and should not be used as a sole basis for treatment. Nasal washings and aspirates are unacceptable for Xpert Xpress SARS-CoV-2/FLU/RSV testing.  Fact Sheet for Patients: BloggerCourse.com  Fact Sheet for Healthcare Providers: SeriousBroker.it  This test is not yet approved or cleared by the [place]  FDA and has been authorized for detection and/or diagnosis of SARS-CoV-2 by FDA under an Emergency Use Authorization (EUA). This EUA will remain in  effect (meaning this test can be used) for the duration of the COVID-19 declaration under Section 564(b)(1) of the Act, 21 U.S.C. section 360bbb-3(b)(1), unless the authorization is terminated or revoked.     Resp Syncytial Virus by PCR NEGATIVE NEGATIVE Final    Comment: (NOTE) Fact Sheet for Patients: BloggerCourse.com  Fact Sheet for Healthcare Providers: SeriousBroker.it  This test is not yet approved or cleared by the [place]  FDA and has been authorized for detection and/or diagnosis of SARS-CoV-2 by FDA under an Emergency Use Authorization (EUA). This EUA will remain in effect (meaning this test can be used) for the duration of the COVID-19 declaration under Section 564(b)(1) of the Act, 21 U.S.C. section 360bbb-3(b)(1), unless the authorization is terminated or revoked.  Performed at [facility], [address], [place], [place] [address]   MRSA Next Gen by PCR, Nasal     Status: None   Collection Time: 05/10/24  9:06 PM   Specimen: Nasal Mucosa; Nasal Swab  Result Value Ref Range Status   MRSA by PCR Next Gen NOT DETECTED NOT DETECTED Final    Comment: (NOTE) The GeneXpert MRSA Assay (FDA approved for NASAL specimens only), is one component of a comprehensive MRSA colonization surveillance program. It is not intended to diagnose MRSA infection nor to guide or monitor treatment for MRSA infections. Test performance is not FDA approved in patients less than 85 years old. Performed at [facility], [address], [place], [place] [address]          Radiology Studies: DG Chest Portable 1 View Result Date: 05/10/2024 CLINICAL DATA:  Shortness of breath and hypoxia.  Dialysis patient. EXAM: PORTABLE CHEST 1 VIEW COMPARISON:  05/05/2024 FINDINGS: The heart is enlarged but appears stable. Stable tortuosity and calcification of the thoracic aorta. Moderate vascular congestion, mild interstitial edema,  small bilateral pleural effusions (right greater than left) and overlying right basilar atelectasis. No pulmonary lesions or pneumothorax. IMPRESSION: Findings consistent with CHF. Electronically Signed   By: MYRTIS Stammer M.D.   On: 05/10/2024 13:10        Scheduled Meds:  amLODipine   2.5 mg Oral Daily   Chlorhexidine  Gluconate Cloth  6 each Topical Q0600   ciprofloxacin   2 drop Both Eyes QID   heparin   5,000 Units Subcutaneous Q8H   insulin  aspart  0-15 Units Subcutaneous TID WC   insulin  glargine-yfgn  8 Units Subcutaneous Daily   levothyroxine   88 mcg Oral QAC breakfast   Continuous Infusions:   LOS: 1 day        Jada Kuhnert, MD [facility]   To contact the  attending provider between 7A-7P or the covering provider during after hours 7P-7A, please log into the web site [email or web address] and access using universal [place] password for that web site. If you do not have the password, please call the hospital operator.  05/11/2024, 12:41 PM

## 2024-05-11 NOTE — Progress Notes (Signed)
 Received patient in bed to unit.  Alert and oriented.  Informed consent signed and in chart.   TX duration:  Patient tolerated well.  Transported back to the room  Alert, without acute distress.  Hand-off given to patient's nurse.   Access used: fistula Access issues: none  Total UF removed: 4000 mls Medication(s) given: none Post HD VS: 148/56 Post HD weight: unable to obtain   05/11/24 0422  Vitals  Temp 98.4 F (36.9 C)  Temp Source Oral  BP (!) 148/56  MAP (mmHg) 81  BP Location Left Arm  BP Method Automatic  Patient Position (if appropriate) Lying  Pulse Rate (!) 104  Pulse Rate Source Monitor  ECG Heart Rate (!) 105  Resp 20  Oxygen Therapy  SpO2 100 %  O2 Device Room Air  Patient Activity (if Appropriate) In bed  Pulse Oximetry Type Continuous  During Treatment Monitoring  Blood Flow Rate (mL/min) 0 mL/min  Arterial Pressure (mmHg) -1.21 mmHg  Venous Pressure (mmHg) -2.22 mmHg  TMP (mmHg) 39.79 mmHg  Ultrafiltration Rate (mL/min) 1304 mL/min  Dialysate Flow Rate (mL/min) 0 ml/min  Duration of HD Treatment -hour(s) 3.5 hour(s)  Cumulative Fluid Removed (mL) per Treatment  4000.2  Post Treatment  Dialyzer Clearance Lightly streaked  Hemodialysis Intake (mL) 0 mL  Liters Processed 84  Fluid Removed (mL) 4000 mL  Tolerated HD Treatment Yes  Post-Hemodialysis Comments HD tx completed as expected, tolerated well, pt is stable.  AVG/AVF Arterial Site Held (minutes) 10 minutes  AVG/AVF Venous Site Held (minutes) 10 minutes  Note  Patient Observations pt is in bed resting  Fistula / Graft Right Forearm  No placement date or time found.   Orientation: Right  Access Location: Forearm  Site Condition No complications  Fistula / Graft Assessment Present;Thrill;Bruit  Status Deaccessed  Drainage Description None      [facility]

## 2024-05-12 ENCOUNTER — Inpatient Hospital Stay (HOSPITAL_COMMUNITY)

## 2024-05-12 DIAGNOSIS — J9601 Acute respiratory failure with hypoxia: Secondary | ICD-10-CM | POA: Diagnosis not present

## 2024-05-12 LAB — HEMOGLOBIN A1C
Hgb A1c MFr Bld: 6.9 % — ABNORMAL HIGH (ref 4.8–5.6)
Mean Plasma Glucose: 151 mg/dL

## 2024-05-12 LAB — GLUCOSE, CAPILLARY
Glucose-Capillary: 203 mg/dL — ABNORMAL HIGH (ref 70–99)
Glucose-Capillary: 222 mg/dL — ABNORMAL HIGH (ref 70–99)

## 2024-05-12 MED ORDER — SODIUM CHLORIDE 0.9 % IV SOLN
1.0000 g | Freq: Every day | INTRAVENOUS | Status: DC
  Administered 2024-05-12 – 2024-05-15 (×4): 1 g via INTRAVENOUS
  Filled 2024-05-12 (×4): qty 10

## 2024-05-12 MED ORDER — AMIODARONE HCL IN DEXTROSE 360-4.14 MG/200ML-% IV SOLN
60.0000 mg/h | INTRAVENOUS | Status: AC
  Administered 2024-05-12 (×2): 60 mg/h via INTRAVENOUS
  Filled 2024-05-12: qty 200

## 2024-05-12 MED ORDER — DILTIAZEM HCL 25 MG/5ML IV SOLN
10.0000 mg | Freq: Four times a day (QID) | INTRAVENOUS | Status: DC | PRN
  Administered 2024-05-12 (×2): 10 mg via INTRAVENOUS
  Filled 2024-05-12 (×2): qty 5

## 2024-05-12 MED ORDER — AMIODARONE LOAD VIA INFUSION
150.0000 mg | Freq: Once | INTRAVENOUS | Status: AC
  Administered 2024-05-12: 150 mg via INTRAVENOUS
  Filled 2024-05-12: qty 83.34

## 2024-05-12 MED ORDER — AMIODARONE HCL IN DEXTROSE 360-4.14 MG/200ML-% IV SOLN
30.0000 mg/h | INTRAVENOUS | Status: DC
  Administered 2024-05-12 – 2024-05-13 (×2): 30 mg/h via INTRAVENOUS
  Filled 2024-05-12 (×2): qty 200

## 2024-05-12 NOTE — Progress Notes (Signed)
   05/12/24 1600  Spiritual Encounters  Type of Visit Initial  Care provided to: Pt and family  Referral source Nurse (RN/NT/LPN)  Reason for visit Advance directives  OnCall Visit Yes  Interventions  Spiritual Care Interventions Made Established relationship of care and support;Compassionate presence   Chaplain responded to spiritual consult for AD.  Chaplain visit declined for AD services. Reports already having this documentation cared for. Aware Chaplain spiritual support services remain available as the need arises.

## 2024-05-12 NOTE — Plan of Care (Signed)

## 2024-05-12 NOTE — Progress Notes (Signed)
 PROGRESS NOTE    Elizabeth Olsen  FMW:[id number] DOB: 18-Dec-1940 DOA: 05/10/2024 PCP: [name], MD   Chief Complaint  Patient presents with   Shortness of Breath    Brief Narrative:  Elizabeth Olsen is a 83 y.o. female with medical history significant of ESRD on hemodialysis, chronic diastolic heart failure, CAD status post PCI, diabetes mellitus type 2 on insulin  pump, hyperlipidemia, paroxysmal atrial fibrillation on anticoagulation, breast cancer, recurrent UTI with history of ESBL E. coli p/w AHRF and elevated BNP c/f hypervolemia iso ESRD.   Pt has dementia and unable to provide medical history. Per daughter, pt went to OP HD center yesterday and completed a normal course of dialysis, but returned home SOB. Her SOB was so severe that she was unable to rest overnight and had multiple O2 sats in the 80s; thus, given ongoing confusion and lack of respiratory improvement the daughter brought mom to the ED for further evaluation.   In the ED, pt hypertensive and tachypneic on 6L HFNC. Labs notable for Na 133, Cr 2.98, and BNP 1412. CXR showed pulmonary congestion and b/l pleural effusions. EDP consulted nephrology for emergent HD and admitted pt to medicine for ongoing care. -Overall patient is extremely frail, deconditioned, demented, family interested in goals of care discussion, end-of-life and palliative/comfort, -Patient continues to decompensate and decline during hospital stay, palliative medicine to evaluate, but I have discussed with daughter at bedside today, at this point she looks uncomfortable, with multiple comorbidities, unstable, vitamin D  want to focus on comfort.    Assessment & Plan:   Principal Problem:   Acute hypoxemic respiratory failure (HCC)  Acute respiratory failure with hypoxia Volume overload due to ESRD Bilateral pleural effusion Acute on chronic diastolic CHF - She presents with increased work of breathing, imaging significant for volume  overload, elevated BNP. - Requiring 6 L on admission, this morning 2 to 3 L oxygen, now drops to 85% on room air. - Volume management with dialysis, she received urgent dialysis on admission, and received HD on schedule today    DM2 - Daughter is concerned about hypoglycemic event as patient becomes symptomatic with any CBGs less than 100, so I will decrease her Semglee  from 8 to 40 units daily, and keep her on very sensitive/ESRD insulin  sliding scale.    HTN -PTA amlodipine  2.5mg  daily, blood pressure is soft we will discontinue   Hypothyroid -PTA synthroid   Paroxysmal A-fib with RVR -On as needed metoprolol  -Off Eliquis  for last 5 months as discussed with the daughter, as she is high risk for fall, and has significant bleeding once her AV fistula deaccessed) as daughter reports mother was going the bandage too soon after dialysis, it is certainly clear risks outweighed benefits of anticoagulation)- -she developed RVR at the end of the dialysis session, blood pressure soft, started on amiodarone  drip   Conjunctivitis - Continue with Cipro  ophthalmic  Right face cellulitis-on IV antibiotics   Failure to thrive Deconditioning Goals of care - Extremely frail, deconditioned, will consult PT OT, family understand how tenuous her status is, they are just hoping that she will be able to get to [place] at the end of her life, daughter confirms DNR status, no heroics, and they will look quest palliative care consult guarding goals of care discussion. - She was seen and evaluated when she came back from HD session, she appears to be somnolent, very frail, deconditioned, altered, I have discussed with daughter, she understands she had a rapid decline,  and at this point our main goal is to continue mainly with comfort measures, will await palliative medicine.  Will continue with simple medical management including IV antibiotics, amiodarone  drip to control heart rate, rest of family members  see and evaluate her, and she is seen by palliative medicine to make a decision about appropriate placement and symptom management.  DVT prophylaxis: Heparin  Code Status: DNR Family Communication: Daughter at bedsdie  Status is: Inpatient    Consultants:  Renal  pallitive   Subjective:  Right cheek pain, swelling, developed A-fib with RVR, and pain overnight requiring as needed fentanyl   Objective: Vitals:   05/12/24 1010 05/12/24 1110 05/12/24 1120 05/12/24 1237  BP: 113/72 (!) 81/54 94/64   Pulse: (!) 135 (!) 138 (!) 149   Resp: 19     Temp:  98.6 F (37 C) 98.6 F (37 C) 98.2 F (36.8 C)  TempSrc:    Oral  SpO2:  100% 100%   Weight:  53.8 kg 53.8 kg   Height:        Intake/Output Summary (Last 24 hours) at 05/12/2024 1436 Last data filed at 05/12/2024 1120 Gross per 24 hour  Intake 300 ml  Output 1400 ml  Net -1100 ml   Filed Weights   05/10/24 1143 05/12/24 1110 05/12/24 1120  Weight: 52.2 kg 53.8 kg 53.8 kg    Examination:  She is somnolent, demented, moaning intermittently, frail, severely deconditioned and appears uncomfortable  Diminished air entry bilaterally  Heart regular irregular, tachycardic  Trace edema      Data Reviewed: I have personally reviewed following labs and imaging studies  CBC: Recent Labs  Lab 05/10/24 1156 05/11/24 0743  WBC 6.8 10.7*  NEUTROABS 5.2  --   HGB 11.0* 11.2*  HCT 35.6* 36.3  MCV 97.3 94.8  PLT 221 235    Basic Metabolic Panel: Recent Labs  Lab 05/10/24 1156 05/11/24 0743  NA 133* 133*  K 3.6 3.7  CL 91* 93*  CO2 32 25  GLUCOSE 139* 186*  BUN 23 17  CREATININE 2.98* 2.64*  CALCIUM  8.5* 8.9    GFR: Estimated Creatinine Clearance: 14 mL/min (A) (by C-G formula based on SCr of 2.64 mg/dL (H)).  Liver Function Tests: Recent Labs  Lab 05/10/24 1156  AST 19  ALT 19  ALKPHOS 72  BILITOT 0.6  PROT 7.1  ALBUMIN  3.0*    CBG: Recent Labs  Lab 05/11/24 0808 05/11/24 1208 05/11/24 1620  05/11/24 2116 05/12/24 1233  GLUCAP 186* 236* 264* 183* 203*     Recent Results (from the past 240 hours)  Resp panel by RT-PCR (RSV, Flu A&B, Covid) Anterior Nasal Swab     Status: None   Collection Time: 05/05/24 12:29 PM   Specimen: Anterior Nasal Swab  Result Value Ref Range Status   SARS Coronavirus 2 by RT PCR NEGATIVE NEGATIVE Final   Influenza A by PCR NEGATIVE NEGATIVE Final   Influenza B by PCR NEGATIVE NEGATIVE Final    Comment: (NOTE) The Xpert Xpress SARS-CoV-2/FLU/RSV plus assay is intended as an aid in the diagnosis of influenza from Nasopharyngeal swab specimens and should not be used as a sole basis for treatment. Nasal washings and aspirates are unacceptable for Xpert Xpress SARS-CoV-2/FLU/RSV testing.  Fact Sheet for Patients: BloggerCourse.com  Fact Sheet for Healthcare Providers: SeriousBroker.it  This test is not yet approved or cleared by the [place]  FDA and has been authorized for detection and/or diagnosis of SARS-CoV-2 by FDA under an Emergency  Use Authorization (EUA). This EUA will remain in effect (meaning this test can be used) for the duration of the COVID-19 declaration under Section 564(b)(1) of the Act, 21 U.S.C. section 360bbb-3(b)(1), unless the authorization is terminated or revoked.     Resp Syncytial Virus by PCR NEGATIVE NEGATIVE Final    Comment: (NOTE) Fact Sheet for Patients: BloggerCourse.com  Fact Sheet for Healthcare Providers: SeriousBroker.it  This test is not yet approved or cleared by the [place]  FDA and has been authorized for detection and/or diagnosis of SARS-CoV-2 by FDA under an Emergency Use Authorization (EUA). This EUA will remain in effect (meaning this test can be used) for the duration of the COVID-19 declaration under Section 564(b)(1) of the Act, 21 U.S.C. section 360bbb-3(b)(1), unless the  authorization is terminated or revoked.  Performed at [facility], [address], [place], [place] [address]   Resp panel by RT-PCR (RSV, Flu A&B, Covid) Anterior Nasal Swab     Status: None   Collection Time: 05/10/24 11:57 AM   Specimen: Anterior Nasal Swab  Result Value Ref Range Status   SARS Coronavirus 2 by RT PCR NEGATIVE NEGATIVE Final   Influenza A by PCR NEGATIVE NEGATIVE Final   Influenza B by PCR NEGATIVE NEGATIVE Final    Comment: (NOTE) The Xpert Xpress SARS-CoV-2/FLU/RSV plus assay is intended as an aid in the diagnosis of influenza from Nasopharyngeal swab specimens and should not be used as a sole basis for treatment. Nasal washings and aspirates are unacceptable for Xpert Xpress SARS-CoV-2/FLU/RSV testing.  Fact Sheet for Patients: BloggerCourse.com  Fact Sheet for Healthcare Providers: SeriousBroker.it  This test is not yet approved or cleared by the [place]  FDA and has been authorized for detection and/or diagnosis of SARS-CoV-2 by FDA under an Emergency Use Authorization (EUA). This EUA will remain in effect (meaning this test can be used) for the duration of the COVID-19 declaration under Section 564(b)(1) of the Act, 21 U.S.C. section 360bbb-3(b)(1), unless the authorization is terminated or revoked.     Resp Syncytial Virus by PCR NEGATIVE NEGATIVE Final    Comment: (NOTE) Fact Sheet for Patients: BloggerCourse.com  Fact Sheet for Healthcare Providers: SeriousBroker.it  This test is not yet approved or cleared by the [place]  FDA and has been authorized for detection and/or diagnosis of SARS-CoV-2 by FDA under an Emergency Use Authorization (EUA). This EUA will remain in effect (meaning this test can be used) for the duration of the COVID-19 declaration under Section 564(b)(1) of the Act, 21 U.S.C. section 360bbb-3(b)(1), unless  the authorization is terminated or revoked.  Performed at [facility], [address] [place], [place] [address]   MRSA Next Gen by PCR, Nasal     Status: None   Collection Time: 05/10/24  9:06 PM   Specimen: Nasal Mucosa; Nasal Swab  Result Value Ref Range Status   MRSA by PCR Next Gen NOT DETECTED NOT DETECTED Final    Comment: (NOTE) The GeneXpert MRSA Assay (FDA approved for NASAL specimens only), is one component of a comprehensive MRSA colonization surveillance program. It is not intended to diagnose MRSA infection nor to guide or monitor treatment for MRSA infections. Test performance is not FDA approved in patients less than 82 years old. Performed at [facility], [address] [place], [place] [address]          Radiology Studies: CT MAXILLOFACIAL WO CONTRAST Result Date: 05/12/2024 CLINICAL DATA:  Blunt right-sided facial trauma with erythema,  swelling, and pain. Potential facial cellulitis. EXAM: CT MAXILLOFACIAL WITHOUT CONTRAST TECHNIQUE: Multidetector CT imaging of the maxillofacial structures was performed. Multiplanar CT image reconstructions were also generated. RADIATION DOSE REDUCTION: This exam was performed according to the departmental dose-optimization program which includes automated exposure control, adjustment of the mA and/or kV according to patient size and/or use of iterative reconstruction technique. COMPARISON:  CT head 05/05/2024 FINDINGS: Osseous: No fracture or mandibular dislocation. No destructive process. Orbits: Negative. No traumatic or inflammatory finding. Sinuses: Opacification of a few left mastoid air cells. No right mastoid effusion. The paranasal sinuses are well aerated. Soft tissues: Hazy fat stranding in the right face about the angle of the mandible. No organized fluid collection or abscess. No retro antral fat stranding. Limited intracranial: No significant or unexpected finding. IMPRESSION: Hazy fat stranding in the  right face about the angle of the mandible. Differential considerations include contusion or cellulitis. No organized fluid collection or abscess. Electronically Signed   By: Norman Gatlin M.D.   On: 05/12/2024 01:43        Scheduled Meds:  amLODipine   2.5 mg Oral QPM   Chlorhexidine  Gluconate Cloth  6 each Topical Q0600   ciprofloxacin   2 drop Both Eyes QID   heparin   5,000 Units Subcutaneous Q8H   insulin  aspart  0-6 Units Subcutaneous TID WC   insulin  glargine-yfgn  4 Units Subcutaneous Daily   levothyroxine   88 mcg Oral QAC breakfast   Continuous Infusions:  amiodarone      Followed by   amiodarone      cefTRIAXone  (ROCEPHIN )  IV 1 g (05/12/24 0513)     LOS: 2 days        Brayton Lye, MD [facility]   To contact the attending provider between 7A-7P or the covering provider during after hours 7P-7A, please log into the web site [email or web address] and access using universal [place] password for that web site. If you do not have the password, please call the hospital operator.  05/12/2024, 2:36 PM

## 2024-05-12 NOTE — TOC Initial Note (Signed)
 Transition of Care [facility]) - Initial/Assessment Note    Patient Details  Name: Elizabeth Olsen MRN: [id number] Date of Birth: 12-Oct-1941  [facility] of [facility]) CM/SW Contact:    Robynn Eileen Hoose, RN Phone Number: 05/12/2024, 2:44 PM  Clinical Narrative:       Patient from home with daughter and grandson. Spoke with daughter at bedside, patient slept through conversation. Per daughter patient has been declining since COVID and death of her husband of 65 years. Patient noted to be on 2 L of oxygen, per daughter this is new. Home oxygen ordered through Jermaine with [employer]. Per [place],  Saturation note from 05/11/24 qualifies for home oxygen. HH PT recommendations noted, history of HH through [place]. Referral sent to Amy with [facility] and accepted. Contact information placed on AVS.          Expected Discharge Plan: Home w Home Health Services Barriers to Discharge: Continued Medical Work up   Patient Goals and CMS Choice            Expected Discharge Plan and Services       Living arrangements for the past 2 months: Apartment                 DME Arranged: Oxygen DME Agency: [employer] Date DME Agency Contacted: 05/12/24 Time DME Agency Contacted: 1141 Representative spoke with at DME Agency: [place]            Prior Living Arrangements/Services Living arrangements for the past 2 months: Apartment Lives with:: Adult Children Technical sales engineer) Patient language and need for interpreter reviewed:: Yes Do you feel safe going back to the place where you live?: Yes      Need for Family Participation in Patient Care: Yes (Comment) Care giver support system in place?: Yes (comment) Current home services: DME (walker, wheelchair) Criminal Activity/Legal Involvement Pertinent to Current Situation/Hospitalization: No - Comment as needed  Activities of Daily Living   ADL Screening (condition at time of admission) Independently performs ADLs?: No Does the patient have a  NEW difficulty with bathing/dressing/toileting/self-feeding that is expected to last >3 days?: Yes (Initiates electronic notice to provider for possible OT consult) Does the patient have a NEW difficulty with getting in/out of bed, walking, or climbing stairs that is expected to last >3 days?: Yes (Initiates electronic notice to provider for possible PT consult) Does the patient have a NEW difficulty with communication that is expected to last >3 days?: Yes (Initiates electronic notice to provider for possible SLP consult) Is the patient deaf or have difficulty hearing?: No Does the patient have difficulty seeing, even when wearing glasses/contacts?: No Does the patient have difficulty concentrating, remembering, or making decisions?: Yes  Permission Sought/Granted                  Emotional Assessment Appearance:: Appears stated age Attitude/Demeanor/Rapport:  (sleeping) Affect (typically observed): Quiet Orientation: :  (unable to assess, pt sleeping) Alcohol  / Substance Use: Not Applicable Psych Involvement: No (comment)  Admission diagnosis:  SOB (shortness of breath) [R06.02] Hypoxia [R09.02] Acute hypoxemic respiratory failure (HCC) [J96.01] Patient Active Problem List   Diagnosis Date Noted   Acute hypoxemic respiratory failure (HCC) 05/10/2024   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   Paroxysmal atrial fibrillation    Breast cancer    ESRD (end stage renal disease) on dialysis    GERD (gastroesophageal reflux disease)    Heart murmur    Hypothyroidism    Neuropathy    Osteoarthritis, hip,  bilateral    PONV (postoperative nausea and vomiting)    Subjective memory complaints    Seborrheic keratoses 03/12/2024   Onychodystrophy 03/12/2024   Fibrous papule of nose 03/12/2024   Blue nevus 03/12/2024   Xerosis of skin 03/12/2024   Other disorders of phosphorus metabolism 11/08/2023   Moderate protein-calorie malnutrition 06/28/2023   Constipation  06/12/2023   Type 2 diabetes mellitus with chronic kidney disease on chronic dialysis, without long-term current use of insulin  06/11/2023   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   Iron  deficiency anemia 05/08/2023   Atherosclerotic heart disease of native coronary artery without angina pectoris 05/08/2023   Allergy 05/08/2023   Secondary hyperparathyroidism of renal origin 05/08/2023   Multiple myeloma in relapse 02/14/2021   Congestive heart failure (CHF) 04/12/2020   Vitamin D  deficiency 10/06/2019   Essential hypertension 09/11/2018   Vertigo 06/24/2018   Anemia of chronic disease 12/10/2017   Acquired hypothyroidism 10/13/2017   Anemia associated with diabetes mellitus 10/13/2017   Type 1 diabetes 07/23/2017   Hyperlipidemia 07/23/2017   Cardiomyopathy 07/23/2017   Colon polyp 03/2007   PCP:  [name], MD Pharmacy:   [id number] - [place], [place] - [address] AT [facility] OF [address] [facility] [address] Phone: [phone] Fax: [phone]     Social Drivers of Health (SDOH) Social History: SDOH Screenings   Food Insecurity: No Food Insecurity (05/11/2024)  Housing: Low Risk  (05/11/2024)  Transportation Needs: No Transportation Needs (05/11/2024)  Utilities: Not At Risk (05/11/2024)  Depression (PHQ2-9): Low Risk  (02/05/2024)  Social Connections: Socially Isolated (05/11/2024)  Tobacco Use: Low Risk  (05/10/2024)   SDOH Interventions:     Readmission Risk Interventions    01/22/2024    2:24 PM  Readmission Risk Prevention Plan  Transportation Screening Complete  Medication Review (RN Care Manager) Complete  PCP or Specialist appointment within 3-5 days of discharge Complete  HRI or Home Care Consult Complete  Palliative Care Screening Not Applicable  Skilled Nursing Facility Not Applicable

## 2024-05-12 NOTE — Progress Notes (Addendum)
 [facility] Progress Note   Subjective:  Seen on HD - 3L UFG and tolerating. Remains on nasal O2. Denies CP or abd pain.  Objective Vitals:   05/11/24 2000 05/11/24 2200 05/12/24 0000 05/12/24 0807  BP: (!) 135/51  (!) 146/54 (!) 156/63  Pulse: 85 86 81 98  Resp: 14 13 20 15   Temp: 99 F (37.2 C)  98.7 F (37.1 C) 98.9 F (37.2 C)  TempSrc: Oral  Oral   SpO2: 100% 99% 100%   Weight:      Height:       Physical Exam General: Well appearing woman, NAD. [place] O2 in place Heart: RRR; 4/6 murmur Lungs: Bibasilar rales present Abdomen: soft Extremities: Trace BLE edema Dialysis Access: RUE AVF +t/b  Additional Objective Labs: Basic Metabolic Panel: Recent Labs  Lab 05/05/24 1227 05/10/24 1156 05/11/24 0743  NA 133* 133* 133*  K 5.1 3.6 3.7  CL 89* 91* 93*  CO2 27 32 25  GLUCOSE 242* 139* 186*  BUN 69* 23 17  CREATININE 4.53* 2.98* 2.64*  CALCIUM  9.5 8.5* 8.9   Liver Function Tests: Recent Labs  Lab 05/05/24 1227 05/10/24 1156  AST 25 19  ALT 19 19  ALKPHOS 81 72  BILITOT 0.8 0.6  PROT 7.3 7.1  ALBUMIN  3.5 3.0*   CBC: Recent Labs  Lab 05/05/24 1227 05/10/24 1156 05/11/24 0743  WBC 12.2* 6.8 10.7*  NEUTROABS  --  5.2  --   HGB 11.7* 11.0* 11.2*  HCT 36.2 35.6* 36.3  MCV 93.5 97.3 94.8  PLT 137* 221 235   Studies/Results: CT MAXILLOFACIAL WO CONTRAST Result Date: 05/12/2024 CLINICAL DATA:  Blunt right-sided facial trauma with erythema, swelling, and pain. Potential facial cellulitis. EXAM: CT MAXILLOFACIAL WITHOUT CONTRAST TECHNIQUE: Multidetector CT imaging of the maxillofacial structures was performed. Multiplanar CT image reconstructions were also generated. RADIATION DOSE REDUCTION: This exam was performed according to the departmental dose-optimization program which includes automated exposure control, adjustment of the mA and/or kV according to patient size and/or use of iterative reconstruction technique. COMPARISON:  CT head  05/05/2024 FINDINGS: Osseous: No fracture or mandibular dislocation. No destructive process. Orbits: Negative. No traumatic or inflammatory finding. Sinuses: Opacification of a few left mastoid air cells. No right mastoid effusion. The paranasal sinuses are well aerated. Soft tissues: Hazy fat stranding in the right face about the angle of the mandible. No organized fluid collection or abscess. No retro antral fat stranding. Limited intracranial: No significant or unexpected finding. IMPRESSION: Hazy fat stranding in the right face about the angle of the mandible. Differential considerations include contusion or cellulitis. No organized fluid collection or abscess. Electronically Signed   By: Norman Gatlin M.D.   On: 05/12/2024 01:43   DG Chest Portable 1 View Result Date: 05/10/2024 CLINICAL DATA:  Shortness of breath and hypoxia.  Dialysis patient. EXAM: PORTABLE CHEST 1 VIEW COMPARISON:  05/05/2024 FINDINGS: The heart is enlarged but appears stable. Stable tortuosity and calcification of the thoracic aorta. Moderate vascular congestion, mild interstitial edema, small bilateral pleural effusions (right greater than left) and overlying right basilar atelectasis. No pulmonary lesions or pneumothorax. IMPRESSION: Findings consistent with CHF. Electronically Signed   By: MYRTIS Stammer M.D.   On: 05/10/2024 13:10   Medications:  cefTRIAXone  (ROCEPHIN )  IV 1 g (05/12/24 0513)    amLODipine   2.5 mg Oral QPM   Chlorhexidine  Gluconate Cloth  6 each Topical Q0600   ciprofloxacin   2 drop Both Eyes QID   heparin   5,000 Units Subcutaneous Q8H   insulin  aspart  0-6 Units Subcutaneous TID WC   insulin  glargine-yfgn  4 Units Subcutaneous Daily   levothyroxine   88 mcg Oral QAC breakfast    Dialysis Orders [facility] MWF 4h  B400   57kg   2K bath  RUE AVF  Heparin  none Mircera 50 mcg q 2 wks, last 7/09, due 7/23   Assessment/Plan: Acute hypoxic resp failure: Pulm edema on imaging, has been losing weight. Improving  with HD. R facial cellulitis?: On ceftriaxone . ESRD: on HD MWF. HD now, 3L UFG and tolerating. Lower EDW on discharge. HTN/volume: BP improving with fluid removal, continue home low dose amlodipine . Lower EDW on discharge. Anemia. Hgb > 11. ESA not due til 7/23. Secondary HPTH: Ca ok, Phos pending. Afib. Metoprolol  prn. No AC.   FTT/Deconditioning/Memory loss. DNR. Palliative care consult for GOC    Elizabeth Olsen 05/12/2024, 8:11 AM  [facility]   Seen and examined independently.  Agree with note and exam as documented above by physician extender and as noted here.  Seen and examined on dialysis.  Procedure supervised.  Blood pressure 134/52 and HR 95.  Tolerating goal.    She feels ok.  No shortness of breath. On 2 liters oxygen.  States wears oxygen at night at home.  No cramping.   Elizabeth JAYSON Saba, MD 05/12/2024  9:33 AM

## 2024-05-12 NOTE — Progress Notes (Signed)
 OT Cancellation Note  Patient Details Name: Elizabeth Olsen MRN: [id number] DOB: [birth year]   Cancelled Treatment:    Reason Eval/Treat Not Completed: Patient at procedure or test/ unavailable- pt in HD.  Will follow and see as able.   Etta NOVAK, OT Acute Rehabilitation Services Office [phone] Secure Chat Preferred    Etta GORMAN Hope 05/12/2024, 8:06 AM

## 2024-05-12 NOTE — Progress Notes (Signed)
 Advised by nephrologist that pt is now comfort care with no plans for further HD. Contacted [facility] on [address] to provide update. Will assist as needed.   Randine Mungo Renal Navigator [phone]

## 2024-05-13 ENCOUNTER — Ambulatory Visit: Payer: Self-pay | Admitting: Cardiology

## 2024-05-13 DIAGNOSIS — Z7189 Other specified counseling: Secondary | ICD-10-CM | POA: Diagnosis not present

## 2024-05-13 DIAGNOSIS — Z515 Encounter for palliative care: Secondary | ICD-10-CM

## 2024-05-13 DIAGNOSIS — J9601 Acute respiratory failure with hypoxia: Secondary | ICD-10-CM | POA: Diagnosis not present

## 2024-05-13 LAB — GLUCOSE, CAPILLARY
Glucose-Capillary: 421 mg/dL — ABNORMAL HIGH (ref 70–99)
Glucose-Capillary: 429 mg/dL — ABNORMAL HIGH (ref 70–99)
Glucose-Capillary: 453 mg/dL — ABNORMAL HIGH (ref 70–99)
Glucose-Capillary: 328 mg/dL — ABNORMAL HIGH (ref 70–99)
Glucose-Capillary: 239 mg/dL — ABNORMAL HIGH (ref 70–99)

## 2024-05-13 LAB — HEPATITIS B SURFACE ANTIBODY, QUANTITATIVE: Hep B S AB Quant (Post): 915 m[IU]/mL

## 2024-05-13 MED ORDER — ATROPINE SULFATE 1 % OP SOLN
2.0000 [drp] | Freq: Four times a day (QID) | OPHTHALMIC | Status: DC | PRN
  Filled 2024-05-13: qty 2

## 2024-05-13 MED ORDER — ONDANSETRON HCL 4 MG PO TABS: 8.0000 mg | ORAL_TABLET | Freq: Three times a day (TID) | ORAL | Status: DC | PRN

## 2024-05-13 MED ORDER — MORPHINE SULFATE 10 MG/5ML PO SOLN: 5.0000 mg | ORAL | Status: DC | PRN

## 2024-05-13 MED ORDER — LORAZEPAM 1 MG PO TABS: 1.0000 mg | ORAL_TABLET | ORAL | Status: DC | PRN

## 2024-05-13 NOTE — Plan of Care (Signed)
 Noted plans for discharge home with hospice.    Nephrology will sign off at this time.  Appreciate team's support of the patient and her family at this time  Elizabeth JAYSON Saba, MD 2:08 PM 05/13/2024

## 2024-05-13 NOTE — Consult Note (Signed)
 Palliative Medicine Inpatient Consult Note  Consulting Provider:  Marcene Eva NOVAK, DO   Reason for consult: assistance with clarification of goals of care; (patient's family requests this)  05/13/2024  HPI:  Per intake H&P --> Elizabeth Olsen is a 83 y.o. female with medical history significant of ESRD on hemodialysis, chronic diastolic heart failure, CAD status post PCI, diabetes mellitus type 2 on insulin  pump, hyperlipidemia, paroxysmal atrial fibrillation on anticoagulation, breast cancer, recurrent UTI with history of ESBL E. coli p/w AHRF and elevated BNP c/f hypervolemia iso ESRD. Palliative care to assist family with goals of care conversations.   Clinical Assessment/Goals of Care:  *Please note that this is a verbal dictation therefore any spelling or grammatical errors are due to the Dragon Medical One system interpretation.  I have reviewed medical records including EPIC notes, labs and imaging, received report from bedside RN, assessed the patient who is lying in bed, starts to grimace during my time and shares she is having some pain in her abdomen.    I met with patients granddaughter, Enos and spoke to her daughter, [name] on speaker-phone to further discuss diagnosis prognosis, GOC, EOL wishes, disposition and options.   I introduced Palliative Medicine as specialized medical care for people living with serious illness. It focuses on providing relief from the symptoms and stress of a serious illness. The goal is to improve quality of life for both the patient and the family.  Medical History Review and Understanding:  A review of Elizabeth Olsen's past medical history significant for end-stage renal disease previously dependent on hemodialysis, diastolic heart failure, coronary artery disease, type 2 diabetes mellitus, hyperlipidemia, paroxysmal atrial fibrillation, & breast cancer was completed.  Social History:  [name] is originally from [place]  She and her family lived  in [place]  for a number of years as well as in [place].  She was with her husband for 62 years prior to him passing away in 2022.  She had a son and a daughter, though sadly her son passed away due to substance abuse.  She worked as a Engineer, structural at [employer].  She is a woman of strong faith.  Functional and Nutritional State:  At baseline, patient is ambulatory with a walker.  She has not had any recent falls. She is able to dress and feed herself. She has had a depleting appetite.   Advance Directives:  A detailed discussion was had today regarding advanced directives.  Patient's decision maker is her daughter, [name].  Code Status:  Concepts specific to code status, artifical feeding and hydration, continued IV antibiotics and rehospitalization was had.  The difference between a aggressive medical intervention path  and a palliative comfort care path for this patient at this time was had.   [name] is an established DO NOT RESUSCITATE DO NOT INTUBATE CODE STATUS.  Discussion:  [name] is identified by her daughter to be an extremely strong matriarch of their family.  She is identified to always have a positive outlook despite very difficult circumstances.  We reviewed that over the last 2 weeks she has had a declining health state.  She has had more episodes of altered mental status and difficulty with her ability to swallow.  She is exemplified extreme tiredness and to her daughter readiness to transition from this life to the next.  We reviewed with the comfort care process looks like in the hospital.We talked about transition to comfort measures in house and what that would entail inclusive of medications  to control pain, dyspnea, agitation, nausea, itching, and hiccups.  We discussed stopping all uneccessary measures such as cardiac monitoring, blood draws, needle sticks, and frequent vital signs.   Patient's daughter shares awareness and she and her family want optimize comfort  among all else.  We discussed allowing today for friends and family to visit and transitioning to a comfort focused care modality tomorrow.  I shared medications such as antibiotics, IV fluids, cardiovascular medications, and additional nontherapeutic medications for end of life would be stopped.  Utilized reflective listening throughout our time together.   Discussed the importance of continued conversation with family and their  medical providers regarding overall plan of care and treatment options, ensuring decisions are within the context of the patients values and GOCs.  Decision Maker: Hercules Ip (Daughter): [phone] (Mobile)   SUMMARY OF RECOMMENDATIONS   DNAR/DNI  Open and honest conversations held with Elizabeth Olsen's daughter and granddaughter about her current clinical condition  Decision to stop hemodialysis was made yesterday  Plan to allow today for friends and family to visit  Transition to comfort measures tomorrow around noon  Patient's daughter would ideally like Erick to remain in the hospital for end-of-life care  Ongoing palliative care support  Code Status/Advance Care Planning: DNAR/DNI  Palliative Prophylaxis:  Aspiration, Bowel Regimen, Delirium Protocol, Frequent Pain Assessment, Oral Care, Palliative Wound Care, and Turn Reposition  Additional Recommendations (Limitations, Scope, Preferences): Continue present care  Psycho-social/Spiritual:  Desire for further Chaplaincy support:  Additional Recommendations:    Prognosis:   Discharge Planning:   Vitals:   05/12/24 2000 05/13/24 0400  BP: (!) 118/56 (!) 99/46  Pulse: (!) 104 89  Resp: 20 (!) 24  Temp: 98.3 F (36.8 C) 97.9 F (36.6 C)  SpO2: 99% 96%    Intake/Output Summary (Last 24 hours) at 05/13/2024 9291 Last data filed at 05/13/2024 0458 Gross per 24 hour  Intake 547.27 ml  Output 1400 ml  Net -852.73 ml   Last Weight  Most recent update: 05/12/2024 11:54 AM    Weight  53.8 kg  (118 lb 9.7 oz)            Gen:  Elderly Latino F chronically ill appearing HEENT: Dry mucous membranes CV: Regular rate and rhythm  PULM: On RA, breathing is nonlabored ABD: soft/nontender  EXT: No edema  Neuro: Aware of self and place  PPS: 10%   This conversation/these recommendations were discussed with patient primary care team, Dr. Sherlon ______________________________________________________ Rosaline Becton [facility] Palliative Medicine Team Team Cell Phone: [phone] Please utilize secure chat with additional questions, if there is no response within 30 minutes please call the above phone number  Total Time: 90 Billing based on MDM: High  Palliative Medicine Team providers are available by phone from 7am to 7pm daily and can be reached through the team cell phone.  Should this patient require assistance outside of these hours, please call the patient's attending physician.

## 2024-05-13 NOTE — Progress Notes (Signed)
   Palliative Medicine Inpatient Follow Up Note  Per secure chat with primary team, Elizabeth Olsen's family would prefer to take her home with hospice.  TOC is aware and will work with family in regards to this preference.   The PMT remains available as needed.  No Charge ______________________________________________________________________________________ Elizabeth Olsen Palliative Medicine Team Team Cell Phone: [phone] Please utilize secure chat with additional questions, if there is no response within 30 minutes please call the above phone number  Palliative Medicine Team providers are available by phone from 7am to 7pm daily and can be reached through the team cell phone.  Should this patient require assistance outside of these hours, please call the patient's attending physician.

## 2024-05-13 NOTE — Progress Notes (Signed)
 PT Cancellation Note  Patient Details Name: SHAWNAE LEIVA MRN: [id number] DOB: 28-Feb-1941   Cancelled Treatment:    Reason Eval/Treat Not Completed: Other (comment) Per chart, pt & family electing to transition to comfort care, hospice. PT to complete current orders.   Richerd Pinal, PT, DPT 05/13/24, 1:35 PM   Richerd CHRISTELLA Pinal 05/13/2024, 1:34 PM

## 2024-05-13 NOTE — Progress Notes (Signed)
 PROGRESS NOTE    Elizabeth Olsen  FMW:[id number] DOB: Aug 21, 1941 DOA: 05/10/2024 PCP: [name], MD   Chief Complaint  Patient presents with   Shortness of Breath    Brief Narrative:  Elizabeth Olsen is a 83 y.o. female with medical history significant of ESRD on hemodialysis, chronic diastolic heart failure, CAD status post PCI, diabetes mellitus type 2 on insulin  pump, hyperlipidemia, paroxysmal atrial fibrillation on anticoagulation, breast cancer, recurrent UTI with history of ESBL E. coli p/w AHRF and elevated BNP c/f hypervolemia iso ESRD.   Pt has dementia and unable to provide medical history. Per daughter, pt went to OP HD center yesterday and completed a normal course of dialysis, but returned home SOB. Her SOB was so severe that she was unable to rest overnight and had multiple O2 sats in the 80s; thus, given ongoing confusion and lack of respiratory improvement the daughter brought mom to the ED for further evaluation.   In the ED, pt hypertensive and tachypneic on 6L HFNC. Labs notable for Na 133, Cr 2.98, and BNP 1412. CXR showed pulmonary congestion and b/l pleural effusions. EDP consulted nephrology for emergent HD and admitted pt to medicine for ongoing care. -Overall patient is extremely frail, deconditioned, demented, family interested in goals of care discussion, end-of-life and palliative/comfort, -Patient continues to decompensate and decline during hospital stay, palliative medicine to evaluate, but I have discussed with daughter at bedside today, at this point she looks uncomfortable, with multiple comorbidities, unstable, vitamin D  want to focus on comfort.    Assessment & Plan:   Principal Problem:   Acute hypoxemic respiratory failure (HCC)  Acute respiratory failure with hypoxia Volume overload due to ESRD Bilateral pleural effusion Acute on chronic diastolic CHF - She presents with increased work of breathing, imaging significant for volume  overload, elevated BNP. - Requiring 6 L on admission, this morning 2 to 3 L oxygen, now drops to 85% on room air. - Volume management with dialysis, she received urgent dialysis on admission, and received HD on schedule 7/14. - For comfort care, no escalation, so no further dialysis as discussed with family.    DM2 - Daughter is concerned about hypoglycemic event as patient becomes symptomatic with any CBGs less than 100, so I will decrease her Semglee  from 8 to 40 units daily, and keep her on very sensitive/ESRD insulin  sliding scale.    HTN -PTA amlodipine  2.5mg  daily, blood pressure is soft we will discontinue   Hypothyroid -PTA synthroid   Paroxysmal A-fib with RVR -On as needed metoprolol  -Off Eliquis  for last 5 months as discussed with the daughter, as she is high risk for fall, and has significant bleeding once her AV fistula deaccessed) as daughter reports mother was going the bandage too soon after dialysis, it is certainly clear risks outweighed benefits of anticoagulation)- -she developed RVR at the end of the dialysis session, blood pressure soft, started on amiodarone  drip   Conjunctivitis - Continue with Cipro  ophthalmic  Right face cellulitis-on IV antibiotics, much improved   Failure to thrive Deconditioning Goals of care - Extremely frail, deconditioned, will consult PT OT, family understand how tenuous her status is, they are just hoping that she will be able to get to [place] at the end of her life, daughter confirms DNR status, no heroics, and they will look quest palliative care consult guarding goals of care discussion. - She was seen and evaluated when she came back from HD session, she appears to be somnolent,  very frail, deconditioned, altered, I have discussed with daughter, she understands she had a rapid decline, and at this point our main goal is to continue mainly with comfort measures, will await palliative medicine.  Will continue with simple medical  management including IV antibiotics, amiodarone  drip to control heart rate, rest of family members see and evaluate her, and she is seen by palliative medicine to make a decision about appropriate placement and symptom management. - Palliative medicine input appreciated, plan for family meeting with palliative this afternoon.  DVT prophylaxis: Comfort Code Status: DNR/comfort Family Communication: Granddaughter at bedside  Status is: Inpatient    Consultants:  Renal  pallitive   Subjective:  Right neck/facial pain has improved, no significant events overnight  Objective: Vitals:   05/12/24 1545 05/12/24 2000 05/13/24 0400 05/13/24 0831  BP: (!) 112/55 (!) 118/56 (!) 99/46 (!) 126/52  Pulse:  (!) 104 89 69  Resp:  20 (!) 24 16  Temp:  98.3 F (36.8 C) 97.9 F (36.6 C) 98.3 F (36.8 C)  TempSrc: Oral Oral Oral Oral  SpO2:  99% 96% 100%  Weight:      Height:        Intake/Output Summary (Last 24 hours) at 05/13/2024 1134 Last data filed at 05/13/2024 0458 Gross per 24 hour  Intake 487.27 ml  Output --  Net 487.27 ml   Filed Weights   05/10/24 1143 05/12/24 1110 05/12/24 1120  Weight: 52.2 kg 53.8 kg 53.8 kg    Examination:  He is more awake and appropriate today, appears comfortable  Diminished air entry at the bases  Regular, but heart rate controlled  Abdomen soft . Trace edema       Data Reviewed: I have personally reviewed following labs and imaging studies  CBC: Recent Labs  Lab 05/10/24 1156 05/11/24 0743  WBC 6.8 10.7*  NEUTROABS 5.2  --   HGB 11.0* 11.2*  HCT 35.6* 36.3  MCV 97.3 94.8  PLT 221 235    Basic Metabolic Panel: Recent Labs  Lab 05/10/24 1156 05/11/24 0743  NA 133* 133*  K 3.6 3.7  CL 91* 93*  CO2 32 25  GLUCOSE 139* 186*  BUN 23 17  CREATININE 2.98* 2.64*  CALCIUM  8.5* 8.9    GFR: Estimated Creatinine Clearance: 14 mL/min (A) (by C-G formula based on SCr of 2.64 mg/dL (H)).  Liver Function Tests: Recent Labs   Lab 05/10/24 1156  AST 19  ALT 19  ALKPHOS 72  BILITOT 0.6  PROT 7.1  ALBUMIN  3.0*    CBG: Recent Labs  Lab 05/11/24 2116 05/12/24 1233 05/12/24 1632 05/13/24 0834 05/13/24 0837  GLUCAP 183* 203* 222* 421* 429*     Recent Results (from the past 240 hours)  Resp panel by RT-PCR (RSV, Flu A&B, Covid) Anterior Nasal Swab     Status: None   Collection Time: 05/05/24 12:29 PM   Specimen: Anterior Nasal Swab  Result Value Ref Range Status   SARS Coronavirus 2 by RT PCR NEGATIVE NEGATIVE Final   Influenza A by PCR NEGATIVE NEGATIVE Final   Influenza B by PCR NEGATIVE NEGATIVE Final    Comment: (NOTE) The Xpert Xpress SARS-CoV-2/FLU/RSV plus assay is intended as an aid in the diagnosis of influenza from Nasopharyngeal swab specimens and should not be used as a sole basis for treatment. Nasal washings and aspirates are unacceptable for Xpert Xpress SARS-CoV-2/FLU/RSV testing.  Fact Sheet for Patients: BloggerCourse.com  Fact Sheet for Healthcare Providers: SeriousBroker.it  This test  is not yet approved or cleared by the [place]  FDA and has been authorized for detection and/or diagnosis of SARS-CoV-2 by FDA under an Emergency Use Authorization (EUA). This EUA will remain in effect (meaning this test can be used) for the duration of the COVID-19 declaration under Section 564(b)(1) of the Act, 21 U.S.C. section 360bbb-3(b)(1), unless the authorization is terminated or revoked.     Resp Syncytial Virus by PCR NEGATIVE NEGATIVE Final    Comment: (NOTE) Fact Sheet for Patients: BloggerCourse.com  Fact Sheet for Healthcare Providers: SeriousBroker.it  This test is not yet approved or cleared by the [place]  FDA and has been authorized for detection and/or diagnosis of SARS-CoV-2 by FDA under an Emergency Use Authorization (EUA). This EUA will remain in effect  (meaning this test can be used) for the duration of the COVID-19 declaration under Section 564(b)(1) of the Act, 21 U.S.C. section 360bbb-3(b)(1), unless the authorization is terminated or revoked.  Performed at [facility], [address], [place], [place] [address]   Resp panel by RT-PCR (RSV, Flu A&B, Covid) Anterior Nasal Swab     Status: None   Collection Time: 05/10/24 11:57 AM   Specimen: Anterior Nasal Swab  Result Value Ref Range Status   SARS Coronavirus 2 by RT PCR NEGATIVE NEGATIVE Final   Influenza A by PCR NEGATIVE NEGATIVE Final   Influenza B by PCR NEGATIVE NEGATIVE Final    Comment: (NOTE) The Xpert Xpress SARS-CoV-2/FLU/RSV plus assay is intended as an aid in the diagnosis of influenza from Nasopharyngeal swab specimens and should not be used as a sole basis for treatment. Nasal washings and aspirates are unacceptable for Xpert Xpress SARS-CoV-2/FLU/RSV testing.  Fact Sheet for Patients: BloggerCourse.com  Fact Sheet for Healthcare Providers: SeriousBroker.it  This test is not yet approved or cleared by the [place]  FDA and has been authorized for detection and/or diagnosis of SARS-CoV-2 by FDA under an Emergency Use Authorization (EUA). This EUA will remain in effect (meaning this test can be used) for the duration of the COVID-19 declaration under Section 564(b)(1) of the Act, 21 U.S.C. section 360bbb-3(b)(1), unless the authorization is terminated or revoked.     Resp Syncytial Virus by PCR NEGATIVE NEGATIVE Final    Comment: (NOTE) Fact Sheet for Patients: BloggerCourse.com  Fact Sheet for Healthcare Providers: SeriousBroker.it  This test is not yet approved or cleared by the [place]  FDA and has been authorized for detection and/or diagnosis of SARS-CoV-2 by FDA under an Emergency Use Authorization (EUA). This EUA will remain in  effect (meaning this test can be used) for the duration of the COVID-19 declaration under Section 564(b)(1) of the Act, 21 U.S.C. section 360bbb-3(b)(1), unless the authorization is terminated or revoked.  Performed at [facility], [address], [place], [place] [address]   MRSA Next Gen by PCR, Nasal     Status: None   Collection Time: 05/10/24  9:06 PM   Specimen: Nasal Mucosa; Nasal Swab  Result Value Ref Range Status   MRSA by PCR Next Gen NOT DETECTED NOT DETECTED Final    Comment: (NOTE) The GeneXpert MRSA Assay (FDA approved for NASAL specimens only), is one component of a comprehensive MRSA colonization surveillance program. It is not intended to diagnose MRSA infection nor to guide or monitor treatment for MRSA infections. Test performance is not FDA approved in patients less than 66 years old. Performed at [facility], [address] [place], [place] [address]  Radiology Studies: CT MAXILLOFACIAL WO CONTRAST Result Date: 05/12/2024 CLINICAL DATA:  Blunt right-sided facial trauma with erythema, swelling, and pain. Potential facial cellulitis. EXAM: CT MAXILLOFACIAL WITHOUT CONTRAST TECHNIQUE: Multidetector CT imaging of the maxillofacial structures was performed. Multiplanar CT image reconstructions were also generated. RADIATION DOSE REDUCTION: This exam was performed according to the departmental dose-optimization program which includes automated exposure control, adjustment of the mA and/or kV according to patient size and/or use of iterative reconstruction technique. COMPARISON:  CT head 05/05/2024 FINDINGS: Osseous: No fracture or mandibular dislocation. No destructive process. Orbits: Negative. No traumatic or inflammatory finding. Sinuses: Opacification of a few left mastoid air cells. No right mastoid effusion. The paranasal sinuses are well aerated. Soft tissues: Hazy fat stranding in the right face about the angle of the mandible. No organized  fluid collection or abscess. No retro antral fat stranding. Limited intracranial: No significant or unexpected finding. IMPRESSION: Hazy fat stranding in the right face about the angle of the mandible. Differential considerations include contusion or cellulitis. No organized fluid collection or abscess. Electronically Signed   By: Norman Gatlin M.D.   On: 05/12/2024 01:43        Scheduled Meds:  Chlorhexidine  Gluconate Cloth  6 each Topical Q0600   ciprofloxacin   2 drop Both Eyes QID   insulin  aspart  0-6 Units Subcutaneous TID WC   insulin  glargine-yfgn  4 Units Subcutaneous Daily   levothyroxine   88 mcg Oral QAC breakfast   Continuous Infusions:  amiodarone  30 mg/hr (05/13/24 0534)   cefTRIAXone  (ROCEPHIN )  IV 1 g (05/12/24 0513)     LOS: 3 days        Brayton Lye, MD [facility]   To contact the attending provider between 7A-7P or the covering provider during after hours 7P-7A, please log into the web site [email or web address] and access using universal [place] password for that web site. If you do not have the password, please call the hospital operator.  05/13/2024, 11:34 AM

## 2024-05-13 NOTE — TOC Progression Note (Signed)
 Transition of Care [facility]) - Progression Note    Patient Details  Name: Elizabeth Olsen MRN: [id number] Date of Birth: 11-03-1940  Transition of Care [facility]) CM/SW Contact  Marval Gell, RN Phone Number: 05/13/2024, 3:27 PM  Clinical Narrative:     Beatris w patient's daughter at bedside, at 13:30. She seemed unsure of wanting to set up home hospice services since her mother is more alert and oriented today. She identified that she used [facility] for her dad a few years ago and would want to use them again for her mom, and was agreeable to have them speak with her. Referral made and liaison spoke w her. She is undecided at this time and will plan to meet w PMT tomorrow again.  [facility] aware patient will need O2 and hospital bed at home if she goes home on home hospice. ICM will continue to follow    Expected Discharge Plan: Home w Hospice Care Barriers to Discharge: Continued Medical Work up  Expected Discharge Plan and Services   Discharge Planning Services: CM Consult Post Acute Care Choice: Durable Medical Equipment, Hospice Living arrangements for the past 2 months: Apartment                 DME Arranged: Oxygen DME Agency: [employer] Date DME Agency Contacted: 05/12/24 Time DME Agency Contacted: 1141 Representative spoke with at DME Agency: [place]             Social Determinants of Health (SDOH) Interventions SDOH Screenings   Food Insecurity: No Food Insecurity (05/11/2024)  Housing: Low Risk  (05/11/2024)  Transportation Needs: No Transportation Needs (05/11/2024)  Utilities: Not At Risk (05/11/2024)  Depression (PHQ2-9): Low Risk  (02/05/2024)  Social Connections: Socially Isolated (05/11/2024)  Tobacco Use: Low Risk  (05/10/2024)    Readmission Risk Interventions    01/22/2024    2:24 PM  Readmission Risk Prevention Plan  Transportation Screening Complete  Medication Review (RN Care Manager) Complete  PCP or Specialist appointment within 3-5 days of discharge  Complete  HRI or Home Care Consult Complete  Palliative Care Screening Not Applicable  Skilled Nursing Facility Not Applicable

## 2024-05-13 NOTE — Consult Note (Signed)
 WOC Nurse wound follow up Wound type: PI stage 2 Measurement: 1 cm x 0.5 cm Wound bed: 100% red. Drainage (amount, consistency, odor) Minimum amount, no odor, serous. Periwound: intact Dressing procedure/placement/frequency: Pt was consulted remotely this morning by our team.  A bed side nurse told the pt that a WOC nurse will see her in person at 5 am.  The pt and the caregiver wait until someone comes.  The WOC team is available in person from 8:00 AM to 3:00 PM, and we have done many consultations remotely when the patient has photos to assess the wound. We decided to maintain the orders.  Dressing procedure/placement/frequency:  Cleanse sacral/coccyx wound with soap and water , dry and apply Xeroform gauze ([place] [phone]) to area daily and secure with silicone foam.  May lift foam daily to replace Xeroform. Change foam q3 days and prn soiling.   WOC team will not plan to follow further. Please reconsult if further assistance is needed. Thank-you,  Lela Holm BSN, CNS, RN, [employer], WOCN  (Phone [phone])

## 2024-05-13 NOTE — Consult Note (Addendum)
 WOC Nurse Consult Note: Reason for Consult: wound  Wound type: Stage 2 Pressure Injury sacrum/coccyx  Pressure Injury POA: Yes Measurement: see nursing flowsheet  Wound bed: red moist, 2 small open areas separated by intact skin  Drainage (amount, consistency, odor) see nursing flowsheet  Periwound: intact  Dressing procedure/placement/frequency:  Cleanse sacral/coccyx wound with soap and water , dry and apply Xeroform gauze (Lawson [phone]) to area daily and secure with silicone foam.  May lift foam daily to replace Xeroform. Change foam q3 days and prn soiling.   POC discussed with bedside nurse. Appreciate T. Whaley-Dula, RN assistance with this consult.   Reconsult if further needs arise.   Thank you,    Powell Bar MSN, RN-BC, [employer]

## 2024-05-13 NOTE — Progress Notes (Signed)
 OT Cancellation Note  Patient Details Name: Elizabeth Olsen MRN: [id number] DOB: 1941/05/01   Cancelled Treatment:    Reason Eval/Treat Not Completed: (P) Other (comment), Pt to go home with hospice, comfort care, signing off.  [name] 05/13/2024, 1:06 PM

## 2024-05-14 DIAGNOSIS — J9601 Acute respiratory failure with hypoxia: Secondary | ICD-10-CM | POA: Diagnosis not present

## 2024-05-14 DIAGNOSIS — Z7189 Other specified counseling: Secondary | ICD-10-CM | POA: Diagnosis not present

## 2024-05-14 DIAGNOSIS — Z515 Encounter for palliative care: Secondary | ICD-10-CM | POA: Diagnosis not present

## 2024-05-14 LAB — GLUCOSE, CAPILLARY
Glucose-Capillary: 595 mg/dL (ref 70–99)
Glucose-Capillary: 544 mg/dL (ref 70–99)
Glucose-Capillary: 373 mg/dL — ABNORMAL HIGH (ref 70–99)
Glucose-Capillary: 349 mg/dL — ABNORMAL HIGH (ref 70–99)

## 2024-05-14 MED ORDER — INSULIN ASPART 100 UNIT/ML IJ SOLN
8.0000 [IU] | Freq: Once | INTRAMUSCULAR | Status: AC
  Administered 2024-05-14: 8 [IU] via SUBCUTANEOUS

## 2024-05-14 MED ORDER — INSULIN GLARGINE-YFGN 100 UNIT/ML ~~LOC~~ SOLN
6.0000 [IU] | Freq: Every day | SUBCUTANEOUS | Status: DC
  Administered 2024-05-14: 6 [IU] via SUBCUTANEOUS
  Filled 2024-05-14 (×2): qty 0.06

## 2024-05-14 NOTE — Progress Notes (Signed)
   Palliative Medicine Inpatient Follow Up Note HPI: Elizabeth Olsen is a 83 y.o. female with medical history significant of ESRD on hemodialysis, chronic diastolic heart failure, CAD status post PCI, diabetes mellitus type 2 on insulin  pump, hyperlipidemia, paroxysmal atrial fibrillation on anticoagulation, breast cancer, recurrent UTI with history of ESBL E. coli p/w AHRF and elevated BNP c/f hypervolemia iso ESRD. Palliative care to assist family with goals of care conversations.   Today's Discussion 05/14/2024  *Please note that this is a verbal dictation therefore any spelling or grammatical errors are due to the Dragon Medical One system interpretation.  Chart reviewed inclusive of vital signs, progress notes, laboratory results, and diagnostic images.   I met with Elizabeth Olsen this morning in the presence of her daughter, Elizabeth Olsen. Klynn shares that her abdominal pain has improved. She is in good spirits this morning.   I spoke with Elizabeth Olsen bedside Lester's bed. Created space and opportunity for patients daughter to explore thoughts feelings and fears regarding her mothers current medical situation. She shares that yesterday her mother had a good day which created doubt over the present path of care. She shares though that her mother has shown an inability to tolerate dialysis treatments which helps assert the decision for her mother to transition home with hospice.  Elizabeth Olsen shares that her family is setting up the home so her mother can be near a large window and see the sunsets. She feels this will be a far more therapeutic environment for her and that she will generally be happier there.  We discussed asking [facility] to stop by to solidify what is needed in the home.  We reviewed no transition will occur unless everything is arranged in the home.  We will hold off on initiating full comfort measures at this time given the goal to get the patient home.   Questions and concerns  addressed/Palliative Support Provided.   Objective Assessment: Vital Signs Vitals:   05/14/24 0153 05/14/24 0800  BP:  (!) 150/50  Pulse: 80   Resp: 20 (!) 22  Temp:  (!) 97.3 F (36.3 C)  SpO2: 100%    No intake or output data in the 24 hours ending 05/14/24 1201 Last Weight  Most recent update: 05/12/2024 11:54 AM    Weight  53.8 kg (118 lb 9.7 oz)            Gen:  Elderly Latino F chronically ill appearing HEENT: Dry mucous membranes CV: Regular rate and rhythm  PULM: On RA, breathing is nonlabored ABD: soft/nontender  EXT: No edema  Neuro: Aware of self and place  SUMMARY OF RECOMMENDATIONS   DNAR/DNI  Plan for [name] to transition home with [facility] hospice  Will halt on full transition to comfort care in house given goal to get [place] home  Ongoing PMT support _____________________________________________________________________________________ Rosaline Becton  Palliative Medicine Team Team Cell Phone: [phone] Please utilize secure chat with additional questions, if there is no response within 30 minutes please call the above phone number  Time Spent: 35 Billing based on MDM: Moderate   Palliative Medicine Team providers are available by phone from 7am to 7pm daily and can be reached through the team cell phone.  Should this patient require assistance outside of these hours, please call the patient's attending physician.

## 2024-05-14 NOTE — Progress Notes (Signed)
 [facility] Room [phone] [facility] Liaison RN note  Received request from [facility] for hospice services at home after discharge. Chart and patient information under review by Hospice physician. Hospice eligibility has been confirmed.   Spoke with patient's daughter [name] to initiate education related to hospice philosophy, services, and team approach to care. She verbalized understanding of information given. Per discussion, the plan is for discharge home by EMS on 05/15/2024.  DME needs discussed. Patient has a wheelchair in the home. Family requests the following equipment for delivery tomorrow afternoon: Oxygen, Bed, Overbed table, walker and specialty mattress. Address has been verified and is correct in the chart. [name] (daughter [phone]) is the family contact to arrange time of equipment delivery.  Please send signed and completed DNR with patient/family. Please provide prescriptions at discharge as needed for ongoing symptom management.  [facility] information and contact numbers given to [facility]. Above information shared with [facility] Debbie Swist.   Please call with any hospice related questions or concerns.  Thank you for the opportunity to participate in this patient's care.   Greig Basket, RN, [facility] Liaison [phone]

## 2024-05-14 NOTE — Progress Notes (Signed)
 PROGRESS NOTE        PATIENT DETAILS Name: Elizabeth Olsen Age: 83 y.o. Sex: female Date of Birth: 03-17-1941 Admit Date: 05/10/2024 Admitting Physician Marsha Ada, MD ERE:[email or web address], [name], MD  Brief Summary: Patient is a 83 y.o.  female with history of ESRD on HD-presented with shortness of breath/volume overload.  Given her overall frailty/deconditioned status-cardiology consulted-subsequently transition to comfort measures-no further HD-with plans to transition to home hospice.  Significant events: 7/12>> admit to [facility]. 7/15>> comfort care.No further HD  Significant studies: 7/12>> CXR: Findings consistent with CHF. 7/14>> CT maxillofacial: Hazy fat stranding in the right face above the angle of the mandible.  Significant microbiology data: 7/12>> COVID/influenza/RSV PCR: Negative  Procedures: None  Consults: Renal Palliative  Subjective: Lying comfortably- Daughter at bedside-no major issues overnight.  Objective: Vitals: Blood pressure (!) 150/50, pulse 80, temperature (!) 97.3 F (36.3 C), temperature source Axillary, resp. rate (!) 22, height 5' 4 (1.626 m), weight 53.8 kg, SpO2 100%.   Exam: Appears frail but comfortable-not in any distress No leg edema Abdomen: Soft nontender nondistended  Pertinent Labs/Radiology:    Latest Ref Rng & Units 05/11/2024    7:43 AM 05/10/2024   11:56 AM 05/05/2024   12:27 PM  CBC  WBC 4.0 - 10.5 K/uL 10.7  6.8  12.2   Hemoglobin 12.0 - 15.0 g/dL 88.7  88.9  88.2   Hematocrit 36.0 - 46.0 % 36.3  35.6  36.2   Platelets 150 - 400 K/uL 235  221  137     Lab Results  Component Value Date   NA 133 (L) 05/11/2024   K 3.7 05/11/2024   CL 93 (L) 05/11/2024   CO2 25 05/11/2024     Assessment/Plan: Acute hypoxic respiratory failure secondary to acute on chronic diastolic CHF in a background of ESRD on HD. Initially required 6 L of oxygen-improved with hemodialysis Currently on 2 L of  oxygen-appears stable-no further HD planned-comfort care-Home with hospice.  PAF with [facility] Supportive care Not on Eliquis  for at least 5 months per prior notes.  Right face cellulitis Improved Continue Rocephin   DM-2 with uncontrolled hyperglycemia Previously on insulin  pump CBGs uncontrolled Given plans for home hospice-will allow some permissive hyperglycemia However have increase Semglee  to 6 units-1 dose of SQ NovoLog  8 units.  HTN No longer on amlodipine   Hypothyroidism Family refusing Synthroid   Conjunctivitis Ciprofloxacin  eyedrops.  Failure to thrive/deconditioning/goals of care DNR placed After discussion with family and palliative care medicine-no further dialysis-plans are to discharge home with hospice.  Code status:   Code Status: Do not attempt resuscitation (DNR) - Comfort care   DVT Prophylaxis:SCD   Family Communication: Daughter at bedside   Disposition Plan: Status is: Inpatient Remains inpatient appropriate because: Severity of illness   Planned Discharge Destination:Hospice care   Diet: Diet Order             Diet Carb Modified Fluid consistency: Thin; Room service appropriate? Yes  Diet effective now                     Antimicrobial agents: Anti-infectives (From admission, onward)    Start     Dose/Rate Route Frequency Ordered Stop   05/12/24 0500  cefTRIAXone  (ROCEPHIN ) 1 g in sodium chloride  0.9 % 100 mL IVPB        1  g 200 mL/hr over 30 Minutes Intravenous Daily 05/12/24 0410          MEDICATIONS: Scheduled Meds:  Chlorhexidine  Gluconate Cloth  6 each Topical Q0600   ciprofloxacin   2 drop Both Eyes QID   insulin  aspart  0-6 Units Subcutaneous TID WC   insulin  glargine-yfgn  6 Units Subcutaneous Daily   levothyroxine   88 mcg Oral QAC breakfast   Continuous Infusions:  cefTRIAXone  (ROCEPHIN )  IV 1 g (05/14/24 0839)   PRN Meds:.artificial tears, atropine , diltiazem , fentaNYL  (SUBLIMAZE ) injection, labetalol ,  LORazepam , morphine , ondansetron    I have personally reviewed following labs and imaging studies  LABORATORY DATA: CBC: Recent Labs  Lab 05/10/24 1156 05/11/24 0743  WBC 6.8 10.7*  NEUTROABS 5.2  --   HGB 11.0* 11.2*  HCT 35.6* 36.3  MCV 97.3 94.8  PLT 221 235    Basic Metabolic Panel: Recent Labs  Lab 05/10/24 1156 05/11/24 0743  NA 133* 133*  K 3.6 3.7  CL 91* 93*  CO2 32 25  GLUCOSE 139* 186*  BUN 23 17  CREATININE 2.98* 2.64*  CALCIUM  8.5* 8.9    GFR: Estimated Creatinine Clearance: 14 mL/min (A) (by C-G formula based on SCr of 2.64 mg/dL (H)).  Liver Function Tests: Recent Labs  Lab 05/10/24 1156  AST 19  ALT 19  ALKPHOS 72  BILITOT 0.6  PROT 7.1  ALBUMIN  3.0*   No results for input(s): LIPASE, AMYLASE in the last 168 hours. No results for input(s): AMMONIA in the last 168 hours.  Coagulation Profile: No results for input(s): INR, PROTIME in the last 168 hours.  Cardiac Enzymes: No results for input(s): CKTOTAL, CKMB, CKMBINDEX, TROPONINI in the last 168 hours.  BNP (last 3 results) No results for input(s): PROBNP in the last 8760 hours.  Lipid Profile: No results for input(s): CHOL, HDL, LDLCALC, TRIG, CHOLHDL, LDLDIRECT in the last 72 hours.  Thyroid  Function Tests: No results for input(s): TSH, T4TOTAL, FREET4, T3FREE, THYROIDAB in the last 72 hours.  Anemia Panel: No results for input(s): VITAMINB12, FOLATE, FERRITIN, TIBC, IRON , RETICCTPCT in the last 72 hours.  Urine analysis:    Component Value Date/Time   COLORURINE YELLOW 01/20/2024 2311   APPEARANCEUR CLEAR 01/20/2024 2311   LABSPEC 1.015 01/20/2024 2311   PHURINE 8.0 01/20/2024 2311   GLUCOSEU NEGATIVE 01/20/2024 2311   HGBUR LARGE (A) 01/20/2024 2311   BILIRUBINUR SMALL (A) 01/20/2024 2311   BILIRUBINUR moderate (A) 11/29/2023 1358   KETONESUR NEGATIVE 01/20/2024 2311   PROTEINUR >300 (A) 01/20/2024 2311    UROBILINOGEN 1.0 11/29/2023 1358   UROBILINOGEN 0.2 04/12/2013 1421   NITRITE NEGATIVE 01/20/2024 2311   LEUKOCYTESUR MODERATE (A) 01/20/2024 2311    Sepsis Labs: Lactic Acid, Venous    Component Value Date/Time   LATICACIDVEN 1.3 05/10/2024 1444    MICROBIOLOGY: Recent Results (from the past 240 hours)  Resp panel by RT-PCR (RSV, Flu A&B, Covid) Anterior Nasal Swab     Status: None   Collection Time: 05/05/24 12:29 PM   Specimen: Anterior Nasal Swab  Result Value Ref Range Status   SARS Coronavirus 2 by RT PCR NEGATIVE NEGATIVE Final   Influenza A by PCR NEGATIVE NEGATIVE Final   Influenza B by PCR NEGATIVE NEGATIVE Final    Comment: (NOTE) The Xpert Xpress SARS-CoV-2/FLU/RSV plus assay is intended as an aid in the diagnosis of influenza from Nasopharyngeal swab specimens and should not be used as a sole basis for treatment. Nasal washings and aspirates are unacceptable for  Xpert Xpress SARS-CoV-2/FLU/RSV testing.  Fact Sheet for Patients: BloggerCourse.com  Fact Sheet for Healthcare Providers: SeriousBroker.it  This test is not yet approved or cleared by the [place]  FDA and has been authorized for detection and/or diagnosis of SARS-CoV-2 by FDA under an Emergency Use Authorization (EUA). This EUA will remain in effect (meaning this test can be used) for the duration of the COVID-19 declaration under Section 564(b)(1) of the Act, 21 U.S.C. section 360bbb-3(b)(1), unless the authorization is terminated or revoked.     Resp Syncytial Virus by PCR NEGATIVE NEGATIVE Final    Comment: (NOTE) Fact Sheet for Patients: BloggerCourse.com  Fact Sheet for Healthcare Providers: SeriousBroker.it  This test is not yet approved or cleared by the [place]  FDA and has been authorized for detection and/or diagnosis of SARS-CoV-2 by FDA under an Emergency Use Authorization  (EUA). This EUA will remain in effect (meaning this test can be used) for the duration of the COVID-19 declaration under Section 564(b)(1) of the Act, 21 U.S.C. section 360bbb-3(b)(1), unless the authorization is terminated or revoked.  Performed at [facility], [address], [place], [place] [address]   Resp panel by RT-PCR (RSV, Flu A&B, Covid) Anterior Nasal Swab     Status: None   Collection Time: 05/10/24 11:57 AM   Specimen: Anterior Nasal Swab  Result Value Ref Range Status   SARS Coronavirus 2 by RT PCR NEGATIVE NEGATIVE Final   Influenza A by PCR NEGATIVE NEGATIVE Final   Influenza B by PCR NEGATIVE NEGATIVE Final    Comment: (NOTE) The Xpert Xpress SARS-CoV-2/FLU/RSV plus assay is intended as an aid in the diagnosis of influenza from Nasopharyngeal swab specimens and should not be used as a sole basis for treatment. Nasal washings and aspirates are unacceptable for Xpert Xpress SARS-CoV-2/FLU/RSV testing.  Fact Sheet for Patients: BloggerCourse.com  Fact Sheet for Healthcare Providers: SeriousBroker.it  This test is not yet approved or cleared by the [place]  FDA and has been authorized for detection and/or diagnosis of SARS-CoV-2 by FDA under an Emergency Use Authorization (EUA). This EUA will remain in effect (meaning this test can be used) for the duration of the COVID-19 declaration under Section 564(b)(1) of the Act, 21 U.S.C. section 360bbb-3(b)(1), unless the authorization is terminated or revoked.     Resp Syncytial Virus by PCR NEGATIVE NEGATIVE Final    Comment: (NOTE) Fact Sheet for Patients: BloggerCourse.com  Fact Sheet for Healthcare Providers: SeriousBroker.it  This test is not yet approved or cleared by the [place]  FDA and has been authorized for detection and/or diagnosis of SARS-CoV-2 by FDA under an Emergency Use  Authorization (EUA). This EUA will remain in effect (meaning this test can be used) for the duration of the COVID-19 declaration under Section 564(b)(1) of the Act, 21 U.S.C. section 360bbb-3(b)(1), unless the authorization is terminated or revoked.  Performed at [facility], [address] [place], [place] [address]   MRSA Next Gen by PCR, Nasal     Status: None   Collection Time: 05/10/24  9:06 PM   Specimen: Nasal Mucosa; Nasal Swab  Result Value Ref Range Status   MRSA by PCR Next Gen NOT DETECTED NOT DETECTED Final    Comment: (NOTE) The GeneXpert MRSA Assay (FDA approved for NASAL specimens only), is one component of a comprehensive MRSA colonization surveillance program. It is not intended to diagnose MRSA infection nor to guide or monitor treatment for MRSA infections. Test performance is not FDA approved in  patients less than 59 years old. Performed at [facility], [address] [place], [place] [address]     RADIOLOGY STUDIES/RESULTS: No results found.   LOS: 4 days   Donalda Applebaum, MD  [facility]    To contact the attending provider between 7A-7P or the covering provider during after hours 7P-7A, please log into the web site [email or web address] and access using universal [place] password for that web site. If you do not have the password, please call the hospital operator.  05/14/2024, 10:13 AM

## 2024-05-14 NOTE — Inpatient Diabetes Management (Signed)
 Inpatient Diabetes Program Recommendations  AACE/ADA: New Consensus Statement on Inpatient Glycemic Control (2015)  Target Ranges:  Prepandial:   less than 140 mg/dL      Peak postprandial:   less than 180 mg/dL (1-2 hours)      Critically ill patients:  140 - 180 mg/dL   Lab Results  Component Value Date   GLUCAP 595 (HH) 05/14/2024   HGBA1C 6.9 (H) 05/10/2024    Latest Reference Range & Units 05/13/24 08:34 05/13/24 08:37 05/13/24 12:02 05/13/24 16:24 05/13/24 21:17 05/14/24 08:01  Glucose-Capillary 70 - 99 mg/dL 578 (H) Novolog  6 units 429 (H) 453 (H) Novolog  6 units 328 (H) Novolog  4 units 239 (H) 595 (HH) Novolog  14 units  (HH): Data is critically high (H): Data is abnormally high  Diabetes history: DM Outpatient Diabetes medications: Insulin  Pump Current orders for Inpatient glycemic control: Semglee  6 units daily, 0-6 units TID   Inpatient Diabetes Program Recommendations:   Please consider: -Add Novolog  0-5 units hs  Noted patient received Novolog  14 for blood glucose 596. May anticipate hypoglycemia over the next few hrs.  Thank you, Elizabeth Olsen E. Leiland Mihelich, RN, MSN, CDCES  Diabetes Coordinator Inpatient Glycemic Control Team Team Pager [phone] (8am-5pm) 05/14/2024 9:37 AM

## 2024-05-14 NOTE — Plan of Care (Signed)

## 2024-05-15 ENCOUNTER — Other Ambulatory Visit (HOSPITAL_COMMUNITY): Payer: Self-pay

## 2024-05-15 ENCOUNTER — Encounter: Payer: Self-pay | Admitting: Hematology

## 2024-05-15 ENCOUNTER — Inpatient Hospital Stay: Admitting: Emergency Medicine

## 2024-05-15 ENCOUNTER — Encounter: Payer: 59 | Admitting: Psychology

## 2024-05-15 DIAGNOSIS — J9601 Acute respiratory failure with hypoxia: Secondary | ICD-10-CM | POA: Diagnosis not present

## 2024-05-15 LAB — GLUCOSE, CAPILLARY
Glucose-Capillary: 462 mg/dL — ABNORMAL HIGH (ref 70–99)
Glucose-Capillary: 481 mg/dL — ABNORMAL HIGH (ref 70–99)
Glucose-Capillary: 357 mg/dL — ABNORMAL HIGH (ref 70–99)

## 2024-05-15 MED ORDER — INSULIN LISPRO 100 UNIT/ML IJ SOLN
INTRAMUSCULAR | 11 refills | Status: DC
  Filled 2024-05-15: qty 10, 28d supply, fill #0

## 2024-05-15 MED ORDER — INSULIN GLARGINE-YFGN 100 UNIT/ML ~~LOC~~ SOLN
8.0000 [IU] | Freq: Every day | SUBCUTANEOUS | Status: DC
  Administered 2024-05-15: 8 [IU] via SUBCUTANEOUS
  Filled 2024-05-15 (×2): qty 0.08

## 2024-05-15 MED ORDER — LORAZEPAM 1 MG PO TABS
1.0000 mg | ORAL_TABLET | ORAL | 0 refills | Status: DC | PRN
  Filled 2024-05-15: qty 30, 30d supply, fill #0

## 2024-05-15 MED ORDER — ONDANSETRON 4 MG PO TBDP
4.0000 mg | ORAL_TABLET | Freq: Three times a day (TID) | ORAL | 0 refills | Status: DC | PRN
  Filled 2024-05-15: qty 20, 30d supply, fill #0

## 2024-05-15 MED ORDER — ATROPINE SULFATE 1 % OP SOLN
2.0000 [drp] | Freq: Four times a day (QID) | OPHTHALMIC | 0 refills | Status: DC | PRN
  Filled 2024-05-15: qty 15, 38d supply, fill #0

## 2024-05-15 MED ORDER — OXYCODONE HCL 5 MG/5ML PO SOLN
5.0000 mg | ORAL | 0 refills | Status: DC | PRN
  Filled 2024-05-15: qty 30, 1d supply, fill #0

## 2024-05-15 MED ORDER — INSULIN SYRINGE-NEEDLE U-100 30G X 1/2" 0.3 ML MISC
0 refills | Status: DC
  Filled 2024-05-15: qty 100, 33d supply, fill #0

## 2024-05-15 NOTE — TOC Transition Note (Addendum)
 Transition of Care [facility]) - Discharge Note   Patient Details  Name: Elizabeth Olsen MRN: [id number] Date of Birth: 24-Mar-1941  [facility] CM/SW Contact:  Corean JAYSON Canary, RN Phone Number: 05/15/2024, 11:51 AM   Clinical Narrative:    Spoke to daughter a thte bedside, equipment has not arrived, but expected to arrive at home by 6pm. Will  call [facility] when DME there and available  [facility] called it will be several hours. DME is delivered. Daughter will take patient home as [facility] is too backed up conferred with Nursing. She is stable at the moment , got up to the BR not too long ago. Home is just around the corner on [address]  Instructed nursing to give the gold  DNR form to the daughter for home [facility] cancelled Final next level of care: Home w Hospice Care Barriers to Discharge: Equipment Delay   Patient Goals and CMS Choice Patient states their goals for this hospitalization and ongoing recovery are:: home with hospice CMS Medicare.gov Compare Post Acute Care list provided to:: Other (Comment Required) Choice offered to / list presented to : Adult Children      Discharge Placement                       Discharge Plan and Services Additional resources added to the After Visit Summary for     Discharge Planning Services: CM Consult Post Acute Care Choice: Durable Medical Equipment, Hospice          DME Arranged: Oxygen DME Agency: [employer] Date DME Agency Contacted: 05/12/24 Time DME Agency Contacted: 1141 Representative spoke with at DME Agency: [place]            Social Drivers of Health (SDOH) Interventions SDOH Screenings   Food Insecurity: No Food Insecurity (05/11/2024)  Housing: Low Risk  (05/11/2024)  Transportation Needs: No Transportation Needs (05/11/2024)  Utilities: Not At Risk (05/11/2024)  Depression (PHQ2-9): Low Risk  (02/05/2024)  Social Connections: Socially Isolated (05/11/2024)  Tobacco Use: Low Risk  (05/10/2024)      Readmission Risk Interventions    01/22/2024    2:24 PM  Readmission Risk Prevention Plan  Transportation Screening Complete  Medication Review (RN Care Manager) Complete  PCP or Specialist appointment within 3-5 days of discharge Complete  HRI or Home Care Consult Complete  Palliative Care Screening Not Applicable  Skilled Nursing Facility Not Applicable

## 2024-05-15 NOTE — Progress Notes (Signed)
 Contacted [facility] to be advised that pt will transition to home with hospice today.   Randine Mungo Renal Navigator [phone]

## 2024-05-15 NOTE — Plan of Care (Signed)

## 2024-05-15 NOTE — Discharge Summary (Signed)
 PATIENT DETAILS Name: Elizabeth Olsen Age: 83 y.o. Sex: female Date of Birth: [birth year] MRN: [id number]. Admitting Physician: Marsha Ada, MD ERE:[email or web address], [name], MD  Admit Date: 05/10/2024 Discharge date: 05/15/2024  Recommendations for Outpatient Follow-up:  Still minimize-discontinue medications-going home with hospice care-focus mostly on comfort  Admitted From:  Home  Disposition: Hospice care   Discharge Condition: poor  CODE STATUS:   Code Status: Do not attempt resuscitation (DNR) - Comfort care   Diet recommendation:  Diet Order             Diet general           Diet Carb Modified Fluid consistency: Thin; Room service appropriate? Yes  Diet effective now                    Brief Summary: Patient is a 83 y.o.  female with history of ESRD on HD-presented with shortness of breath/volume overload.  Given her overall frailty/deconditioned status-cardiology consulted-subsequently transition to comfort measures-no further HD-with plans to transition to home hospice.   Significant events: 7/12>> admit to [facility]. 7/15>> comfort care.No further HD   Significant studies: 7/12>> CXR: Findings consistent with CHF. 7/14>> CT maxillofacial: Hazy fat stranding in the right face above the angle of the mandible.   Significant microbiology data: 7/12>> COVID/influenza/RSV PCR: Negative   Procedures: None   Consults: Renal Palliative  Brief Hospital Course: Acute hypoxic respiratory failure secondary to acute on chronic diastolic CHF in a background of ESRD on HD. Initially required 6 L of oxygen-improved with hemodialysis Currently on room air or on just 2 L of oxygen-appears stable-no further HD planned-comfort care-Home with hospice.   PAF with [facility] Supportive care Not on Eliquis  for at least 5 months per prior notes.   Right face cellulitis Improved-treated with Rocephin -no further antibiotics planned on discharge.   DM-2 with uncontrolled  hyperglycemia Previously on insulin  pump CBGs uncontrolled but oral intake is erratic-at risk for hypoglycemia-discussed with daughter-will continue with current strategy of allowing some amount of permissive hyperglycemia. Continue Semglee  8 units on discharge-continue SSI. Since patient going home with hospice care-no longer on HD-at risk for hypoglycemia if this is aggressively controlled at this point.   HTN No longer on amlodipine   ESRD on HD MWF After discussion with family by palliative care team-no further HD planned.  Last HD on 7/14-she still appears relatively stable-but suspect will become more symptomatic soon.   Hypothyroidism Family refusing Synthroid    Conjunctivitis Ciprofloxacin  eyedrops.   Failure to thrive/deconditioning/goals of care DNR  After discussion with family and palliative care medicine-no further dialysis-plans are to discharge home with hospice.  Poor overall prognosis-last dialysis was several days    Discharge Diagnoses:  Principal Problem:   Acute hypoxemic respiratory failure [facility])   Discharge Instructions:  Activity:  As tolerated with Full fall precautions use walker/cane & assistance as needed  Discharge Instructions     Diet general   Complete by: As directed    Discharge instructions   Complete by: As directed    Follow with Primary MD  [name], MD in 1-2 weeks  Please get a complete blood count and chemistry panel checked by your Primary MD at your next visit, and again as instructed by your Primary MD.  Get Medicines reviewed and adjusted: Please take all your medications with you for your next visit with your Primary MD  Laboratory/radiological data: Please request your Primary MD to go over all hospital tests and  procedure/radiological results at the follow up, please ask your Primary MD to get all Hospital records sent to his/her office.  In some cases, they will be blood work, cultures and biopsy results  pending at the time of your discharge. Please request that your primary care M.D. follows up on these results.  Also Note the following: If you experience worsening of your admission symptoms, develop shortness of breath, life threatening emergency, suicidal or homicidal thoughts you must seek medical attention immediately by calling 911 or calling your MD immediately  if symptoms less severe.  You must read complete instructions/literature along with all the possible adverse reactions/side effects for all the Medicines you take and that have been prescribed to you. Take any new Medicines after you have completely understood and accpet all the possible adverse reactions/side effects.   Do not drive when taking Pain medications or sleeping medications (Benzodaizepines)  Do not take more than prescribed Pain, Sleep and Anxiety Medications. It is not advisable to combine anxiety,sleep and pain medications without talking with your primary care practitioner  Special Instructions: If you have smoked or chewed Tobacco  in the last 2 yrs please stop smoking, stop any regular Alcohol   and or any Recreational drug use.  Wear Seat belts while driving.  Please note: You were cared for by a hospitalist during your hospital stay. Once you are discharged, your primary care physician will handle any further medical issues. Please note that NO REFILLS for any discharge medications will be authorized once you are discharged, as it is imperative that you return to your primary care physician (or establish a relationship with a primary care physician if you do not have one) for your post hospital discharge needs so that they can reassess your need for medications and monitor your lab values.   Check CBGs before meals and at bedtime.   Discharge wound care:   Complete by: As directed    Cleanse sacral/coccyx wound with NS,  apply Xeroform gauze Soila [phone]) to area daily and secure with silicone foam.  May lift  foam daily to replace Xeroform. Change foam q3 days and prn soilin   Increase activity slowly   Complete by: As directed       Allergies as of 05/15/2024       Reactions   Erythromycin Shortness Of Breath, Swelling   throat swelling, sob, thrashing ,    Penicillins Rash   Tape Other (See Comments)   Patient states that she prefers paper tape because other tapes tear skin.   Dextromethorphan Rash   Doxylamine-dm Rash   Nyquil [pseudoeph-doxylamine-dm-apap] Rash   Sulfa Antibiotics Rash, Other (See Comments)        Medication List     STOP taking these medications    acetaminophen  325 MG tablet Commonly known as: TYLENOL    amLODipine  2.5 MG tablet Commonly known as: NORVASC    Baqsimi One Pack 3 MG/DOSE Powd Generic drug: Glucagon   cloNIDine  0.1 MG tablet Commonly known as: CATAPRES    Dry Eye Relief Drops 0.2-0.2-1 % Soln Generic drug: Glycerin -Hypromellose-PEG 400   levothyroxine  88 MCG tablet Commonly known as: SYNTHROID    metoprolol  succinate 25 MG 24 hr tablet Commonly known as: TOPROL -XL   VITAMIN K2-VITAMIN D3 PO       TAKE these medications    atropine  1 % ophthalmic solution Place 2 drops under the tongue 4 (four) times daily as needed (Secretion burden).   insulin  lispro 100 UNIT/ML injection Commonly known as: HUMALOG  0-6 Units,  Subcutaneous, 3 times daily with meals CBG < 70: Implement Hypoglycemia measures and call MD CBG 70 - 120: 0 units CBG 121 - 150: 0 units CBG 151 - 200: 1 unit CBG 201-250: 2 units CBG 251-300: 3 units CBG 301-350: 4 units CBG 351-400: 5 units CBG > 400: Give 6 units and call MD What changed:  how much to take how to take this when to take this additional instructions   Lantus  SoloStar 100 UNIT/ML Solostar Pen Generic drug: insulin  glargine Inject 9 Units into the skin every morning. And takes 2 units after each meal   LORazepam  1 MG tablet Commonly known as: ATIVAN  Take 1 tablet (1 mg total) by mouth every 4  (four) hours as needed for anxiety, seizure or sleep.   ondansetron  4 MG disintegrating tablet Commonly known as: ZOFRAN -ODT Take 1 tablet (4 mg total) by mouth every 8 (eight) hours as needed for nausea or vomiting.   oxyCODONE  5 MG/5ML solution Commonly known as: ROXICODONE  Take 5 mLs (5 mg total) by mouth every 4 (four) hours as needed (pain/anxiety/shortness of breath/comfort care).               Discharge Care Instructions  (From admission, onward)           Start     Ordered   05/15/24 0000  Discharge wound care:       Comments: Cleanse sacral/coccyx wound with NS,  apply Xeroform gauze Soila [phone]) to area daily and secure with silicone foam.  May lift foam daily to replace Xeroform. Change foam q3 days and prn soilin   05/15/24 1009            Follow-up Information     [facility] Follow up.   Why: Physical therapy. Office will call to arrange follow up after hospital discharge. Contact information: [address] [place] [address] [phone]         [name], MD Follow up.   Specialty: Internal Medicine Why: As needed Contact information: [address] [place] [address] [phone]                Allergies  Allergen Reactions   Erythromycin Shortness Of Breath and Swelling    throat swelling, sob, thrashing ,    Penicillins Rash   Tape Other (See Comments)    Patient states that she prefers paper tape because other tapes tear skin.   Dextromethorphan Rash   Doxylamine-Dm Rash   Nyquil [Pseudoeph-Doxylamine-Dm-Apap] Rash   Sulfa Antibiotics Rash and Other (See Comments)     Other Procedures/Studies: CT MAXILLOFACIAL WO CONTRAST Result Date: 05/12/2024 CLINICAL DATA:  Blunt right-sided facial trauma with erythema, swelling, and pain. Potential facial cellulitis. EXAM: CT MAXILLOFACIAL WITHOUT CONTRAST TECHNIQUE: Multidetector CT imaging of the maxillofacial structures was  performed. Multiplanar CT image reconstructions were also generated. RADIATION DOSE REDUCTION: This exam was performed according to the departmental dose-optimization program which includes automated exposure control, adjustment of the mA and/or kV according to patient size and/or use of iterative reconstruction technique. COMPARISON:  CT head 05/05/2024 FINDINGS: Osseous: No fracture or mandibular dislocation. No destructive process. Orbits: Negative. No traumatic or inflammatory finding. Sinuses: Opacification of a few left mastoid air cells. No right mastoid effusion. The paranasal sinuses are well aerated. Soft tissues: Hazy fat stranding in the right face about the angle of the mandible. No organized fluid collection or abscess. No retro antral fat stranding. Limited intracranial: No  significant or unexpected finding. IMPRESSION: Hazy fat stranding in the right face about the angle of the mandible. Differential considerations include contusion or cellulitis. No organized fluid collection or abscess. Electronically Signed   By: Norman Gatlin M.D.   On: 05/12/2024 01:43   DG Chest Portable 1 View Result Date: 05/10/2024 CLINICAL DATA:  Shortness of breath and hypoxia.  Dialysis patient. EXAM: PORTABLE CHEST 1 VIEW COMPARISON:  05/05/2024 FINDINGS: The heart is enlarged but appears stable. Stable tortuosity and calcification of the thoracic aorta. Moderate vascular congestion, mild interstitial edema, small bilateral pleural effusions (right greater than left) and overlying right basilar atelectasis. No pulmonary lesions or pneumothorax. IMPRESSION: Findings consistent with CHF. Electronically Signed   By: MYRTIS Stammer M.D.   On: 05/10/2024 13:10   CT ABDOMEN PELVIS W CONTRAST Result Date: 05/05/2024 CLINICAL DATA:  rule out pyelonephritis, anuric EXAM: CT ABDOMEN AND PELVIS WITH CONTRAST TECHNIQUE: Multidetector CT imaging of the abdomen and pelvis was performed using the standard protocol following bolus  administration of intravenous contrast. RADIATION DOSE REDUCTION: This exam was performed according to the departmental dose-optimization program which includes automated exposure control, adjustment of the mA and/or kV according to patient size and/or use of iterative reconstruction technique. CONTRAST:  75mL OMNIPAQUE  IOHEXOL  350 MG/ML SOLN COMPARISON:  CT abdomen pelvis 06/11/2023, CT abdomen pelvis 01/20/2024 FINDINGS: Lower chest: Small to moderate volume right pleural effusion. Left base patchy airspace opacity with nodule like pericentimeter density (5:11). Hepatobiliary: No focal liver abnormality. Status post cholecystectomy. No biliary dilatation. Pancreas: Diffusely atrophic. No focal lesion. Otherwise normal pancreatic contour. No surrounding inflammatory changes. No main pancreatic ductal dilatation. Spleen: Normal in size without focal abnormality. Adrenals/Urinary Tract: No adrenal nodule bilaterally. Bilateral kidneys enhance symmetrically. Fluid density lesions likely represent simple renal cysts. Simple renal cysts, in the absence of clinically indicated signs/symptoms, require no independent follow-up. No hydronephrosis. No hydroureter. No nephroureterolithiasis bilaterally. The urinary bladder is unremarkable. On delayed imaging, there is no excretion of intravenous contrast from either kidneys. Stomach/Bowel: Stomach is within normal limits. No evidence of bowel wall thickening or dilatation. Scattered colonic diverticula. The appendix is not definitely identified with no inflammatory changes in the right lower quadrant to suggest acute appendicitis. Vascular/Lymphatic: No abdominal aorta or iliac aneurysm. At least moderate atherosclerotic plaque of the aorta and its branches. No abdominal, pelvic, or inguinal lymphadenopathy. Reproductive: Status post hysterectomy. No adnexal masses. Other: No intraperitoneal free fluid. No intraperitoneal free gas. No organized fluid collection.  Musculoskeletal: No abdominal wall hernia or abnormality. Subcutaneus soft tissue edema. Retained metallic densities along left lower chest and abdominal wall. No suspicious lytic or blastic osseous lesions. No acute displaced fracture. Multilevel degenerative changes of the spine. IMPRESSION: 1. Left base patchy airspace opacity with nodularlike pericentimeter density. Finding could represent a combination of atelectasis versus infection versus underlying nodule. Recommend repeat CT in 3 months to evaluate for complete resolution. 2. Small to moderate volume right pleural effusion. 3. No excretion of intravenous contrast from either kidneys on delayed images. Correlate with renal function test. 4. Colonic diverticulosis with no acute diverticulitis. Electronically Signed   By: Morgane  Naveau M.D.   On: 05/05/2024 20:54   CT Head Wo Contrast Result Date: 05/05/2024 CLINICAL DATA:  Mental status change, unknown cause EXAM: CT HEAD WITHOUT CONTRAST TECHNIQUE: Contiguous axial images were obtained from the base of the skull through the vertex without intravenous contrast. RADIATION DOSE REDUCTION: This exam was performed according to the departmental dose-optimization program which includes automated  exposure control, adjustment of the mA and/or kV according to patient size and/or use of iterative reconstruction technique. COMPARISON:  CT of the head dated May 22, 2023. FINDINGS: Brain: Age-related cerebral volume loss. There is mild periventricular and deep cerebral white matter disease. There is no evidence of hemorrhage, mass, acute cortical infarct or hydrocephalus. Vascular: Age-related atrophy and mild periventricular white matter disease. Skull: Intact and unremarkable. Sinuses/Orbits: Clear paranasal sinuses. Status post bilateral lens replacement. Other: None. IMPRESSION: 1. Age-related atrophy and mild periventricular white matter disease. No apparent acute process. Electronically Signed   By: Evalene Coho M.D.   On: 05/05/2024 17:37   DG Chest 1 View Result Date: 05/05/2024 CLINICAL DATA:  Altered mental status EXAM: CHEST  1 VIEW COMPARISON:  01/20/2024 FINDINGS: Moderate cardiomegaly. Mildly elevated right hemidiaphragm with some blunting of the right lateral costophrenic angle, query small right pleural effusion. Atherosclerotic calcification of the aortic arch. Left breast clips and left axillary clips noted. Indistinct pulmonary vasculature favoring pulmonary venous hypertension. Thoracic spondylosis. IMPRESSION: 1. Moderate cardiomegaly with pulmonary venous hypertension and possible small right pleural effusion. 2. Aortic Atherosclerosis (ICD10-I70.0). Electronically Signed   By: Ryan Salvage M.D.   On: 05/05/2024 13:20   ECHOCARDIOGRAM COMPLETE Result Date: 05/01/2024    ECHOCARDIOGRAM REPORT   Patient Name:   Elizabeth Olsen [facility] Date of Exam: 05/01/2024 Medical Rec #:  [id number]      Height:       63.0 in Accession #:    [id number]     Weight:       129.0 lb Date of Birth:  Jun 01, 1941       BSA:          1.605 m Patient Age:    83 years       BP:           154/100 mmHg Patient Gender: F              HR:           69 bpm. Exam Location:  [address] Procedure: 2D Echo, 3D Echo, Cardiac Doppler, Color Doppler and Strain Analysis            (Both Spectral and Color Flow Doppler were utilized during            procedure). Indications:    R06.00 Dyspnea  History:        Patient has prior history of Echocardiogram examinations, most                 recent 06/28/2023. CAD, CKD, Arrythmias:Atrial Fibrillation; Risk                 Factors:Hypertension.  Sonographer:    Waldo Guadalajara RCS Referring Phys: [phone] ROBERT J KRASOWSKI IMPRESSIONS  1. Left ventricular ejection fraction, by estimation, is 50 to 55%. Left ventricular ejection fraction by 3D volume is 50 %. The left ventricle has low normal function. The left ventricle has no regional wall motion abnormalities. Left ventricular diastolic parameters  are consistent with Grade III diastolic dysfunction (restrictive). Elevated left ventricular end-diastolic pressure. The E/e' is 21.  2. Right ventricular systolic function is low normal. The right ventricular size is normal. There is moderately elevated pulmonary artery systolic pressure. The estimated right ventricular systolic pressure is 59.2 mmHg.  3. Calcified structure in the right atrium measuring 1.0 x 1.74 cm, adherent to the free wall - may be a cast from prior dialysis catheter or port.  4. The  mitral valve is degenerative. Moderate to severe mitral valve regurgitation. Moderate mitral annular calcification.  5. The tricuspid valve is abnormal. Tricuspid valve regurgitation is moderate.  6. The aortic valve is tricuspid. Aortic valve regurgitation is trivial. Aortic valve sclerosis/calcification is present, without any evidence of aortic stenosis.  7. The inferior vena cava is normal in size with greater than 50% respiratory variability, suggesting right atrial pressure of 3 mmHg. Comparison(s): Changes from prior study are noted. 06/28/2023: LVEF 55-60%. FINDINGS  Left Ventricle: Left ventricular ejection fraction, by estimation, is 50 to 55%. Left ventricular ejection fraction by 3D volume is 50 %. The left ventricle has low normal function. The left ventricle has no regional wall motion abnormalities. The left ventricular internal cavity size was normal in size. There is no left ventricular hypertrophy. Left ventricular diastolic parameters are consistent with Grade III diastolic dysfunction (restrictive). Elevated left ventricular end-diastolic pressure. The E/e' is 21. Right Ventricle: The right ventricular size is normal. No increase in right ventricular wall thickness. Right ventricular systolic function is low normal. There is moderately elevated pulmonary artery systolic pressure. The tricuspid regurgitant velocity  is 3.75 m/s, and with an assumed right atrial pressure of 3 mmHg, the estimated  right ventricular systolic pressure is 59.2 mmHg. Left Atrium: Left atrial size was normal in size. Right Atrium: Calcified structure in the right atrium measuring 1.0 x 1.74 cm, adherent to the free wall - may be a cast from prior dialysis catheter or port. Right atrial size was normal in size. Pericardium: There is no evidence of pericardial effusion. Mitral Valve: The mitral valve is degenerative in appearance. There is mild calcification of the posterior mitral valve leaflet(s). Moderate mitral annular calcification. Moderate to severe mitral valve regurgitation. Tricuspid Valve: The tricuspid valve is abnormal. Tricuspid valve regurgitation is moderate. Aortic Valve: The aortic valve is tricuspid. Aortic valve regurgitation is trivial. Aortic regurgitation PHT measures 816 msec. Aortic valve sclerosis/calcification is present, without any evidence of aortic stenosis. Pulmonic Valve: The pulmonic valve was grossly normal. Pulmonic valve regurgitation is mild. Aorta: The aortic root and ascending aorta are structurally normal, with no evidence of dilitation. Venous: The inferior vena cava is normal in size with greater than 50% respiratory variability, suggesting right atrial pressure of 3 mmHg. IAS/Shunts: No atrial level shunt detected by color flow Doppler. Additional Comments: 3D was performed not requiring image post processing on an independent workstation and was abnormal.  LEFT VENTRICLE PLAX 2D LVIDd:         4.70 cm         Diastology LVIDs:         3.50 cm         LV e' medial:    5.11 cm/s LV PW:         1.10 cm         LV E/e' medial:  24.3 LV IVS:        1.00 cm         LV e' lateral:   7.07 cm/s LVOT diam:     2.00 cm         LV E/e' lateral: 17.5 LV SV:         54 LV SV Index:   34 LVOT Area:     3.14 cm        3D Volume EF  LV 3D EF:    Left                                             ventricul                                             ar                                              ejection                                             fraction                                             by 3D                                             volume is                                             50 %.                                 3D Volume EF:                                3D EF:        50 %                                LV EDV:       146 ml                                LV ESV:       74 ml                                LV SV:        72 ml RIGHT VENTRICLE RV Basal diam:  3.60 cm RV S prime:     10.80 cm/s TAPSE (M-mode): 1.5 cm RVSP:           59.2 mmHg LEFT ATRIUM             Index        RIGHT ATRIUM           Index LA diam:  4.10 cm 2.55 cm/m   RA Pressure: 3.00 mmHg LA Vol (A2C):   58.9 ml 36.70 ml/m  RA Area:     14.60 cm LA Vol (A4C):   37.0 ml 23.05 ml/m  RA Volume:   39.00 ml  24.30 ml/m LA Biplane Vol: 50.9 ml 31.72 ml/m  AORTIC VALVE LVOT Vmax:   67.10 cm/s LVOT Vmean:  50.300 cm/s LVOT VTI:    0.173 m AI PHT:      816 msec  AORTA Ao Root diam: 2.70 cm Ao Asc diam:  3.20 cm MITRAL VALVE                  TRICUSPID VALVE MV Area (PHT):                TR Peak grad:   56.2 mmHg MV Decel Time:                TR Vmax:        375.00 cm/s MR Peak grad:    69.6 mmHg    Estimated RAP:  3.00 mmHg MR Mean grad:    50.0 mmHg    RVSP:           59.2 mmHg MR Vmax:         417.00 cm/s MR Vmean:        337.0 cm/s   SHUNTS MR PISA:         3.08 cm     Systemic VTI:  0.17 m MR PISA Eff ROA: 23 mm       Systemic Diam: 2.00 cm MR PISA Radius:  0.70 cm MV E velocity: 124.00 cm/s MV A velocity: 51.40 cm/s MV E/A ratio:  2.41 Vinie Maxcy MD Electronically signed by Vinie Maxcy MD Signature Date/Time: 05/01/2024/11:05:03 PM    Final      TODAY-DAY OF DISCHARGE:  Subjective:   Elizabeth Olsen today has no headache,no chest abdominal pain,no new weakness tingling or numbness, feels much better wants to go home today.   Objective:   Blood pressure 135/63, pulse 90,  temperature 98.1 F (36.7 C), temperature source Oral, resp. rate 18, height 5' 4 (1.626 m), weight 53.8 kg, SpO2 94%. No intake or output data in the 24 hours ending 05/15/24 1009 Filed Weights   05/10/24 1143 05/12/24 1110 05/12/24 1120  Weight: 52.2 kg 53.8 kg 53.8 kg    Exam: Awake Alert, Oriented *3, No new F.N deficits, Normal affect Rooks.AT,PERRAL Supple Neck,No JVD, No cervical lymphadenopathy appriciated.  Symmetrical Chest wall movement, Good air movement bilaterally, CTAB RRR,No Gallops,Rubs or new Murmurs, No Parasternal Heave +ve B.Sounds, Abd Soft, Non tender, No organomegaly appriciated, No rebound -guarding or rigidity. No Cyanosis, Clubbing or edema, No new Rash or bruise   PERTINENT RADIOLOGIC STUDIES: No results found.   PERTINENT LAB RESULTS: CBC: No results for input(s): WBC, HGB, HCT, PLT in the last 72 hours. CMET CMP     Component Value Date/Time   NA 133 (L) 05/11/2024 0743   NA 130 (L) 05/21/2020 0952   K 3.7 05/11/2024 0743   CL 93 (L) 05/11/2024 0743   CO2 25 05/11/2024 0743   GLUCOSE 186 (H) 05/11/2024 0743   BUN 17 05/11/2024 0743   BUN 38 (H) 05/21/2020 0952   CREATININE 2.64 (H) 05/11/2024 0743   CREATININE 2.00 (H) 02/10/2020 0925   CALCIUM  8.9 05/11/2024 0743   PROT 7.1 05/10/2024 1156   PROT 6.7 03/17/2020 1134   ALBUMIN  3.0 (L) 05/10/2024  1156   ALBUMIN  3.7 03/17/2020 1134   AST 19 05/10/2024 1156   AST 24 02/10/2020 0925   ALT 19 05/10/2024 1156   ALT 16 02/10/2020 0925   ALKPHOS 72 05/10/2024 1156   BILITOT 0.6 05/10/2024 1156   BILITOT 0.2 03/17/2020 1134   BILITOT 0.4 02/10/2020 0925   GFRNONAA 18 (L) 05/11/2024 0743   GFRNONAA 23 (L) 02/10/2020 0925    GFR Estimated Creatinine Clearance: 14 mL/min (A) (by C-G formula based on SCr of 2.64 mg/dL (H)). No results for input(s): LIPASE, AMYLASE in the last 72 hours. No results for input(s): CKTOTAL, CKMB, CKMBINDEX, TROPONINI in the last 72  hours. Invalid input(s): POCBNP No results for input(s): DDIMER in the last 72 hours. No results for input(s): HGBA1C in the last 72 hours. No results for input(s): CHOL, HDL, LDLCALC, TRIG, CHOLHDL, LDLDIRECT in the last 72 hours. No results for input(s): TSH, T4TOTAL, T3FREE, THYROIDAB in the last 72 hours.  Invalid input(s): FREET3 No results for input(s): VITAMINB12, FOLATE, FERRITIN, TIBC, IRON , RETICCTPCT in the last 72 hours. Coags: No results for input(s): INR in the last 72 hours.  Invalid input(s): PT Microbiology: Recent Results (from the past 240 hours)  Resp panel by RT-PCR (RSV, Flu A&B, Covid) Anterior Nasal Swab     Status: None   Collection Time: 05/05/24 12:29 PM   Specimen: Anterior Nasal Swab  Result Value Ref Range Status   SARS Coronavirus 2 by RT PCR NEGATIVE NEGATIVE Final   Influenza A by PCR NEGATIVE NEGATIVE Final   Influenza B by PCR NEGATIVE NEGATIVE Final    Comment: (NOTE) The Xpert Xpress SARS-CoV-2/FLU/RSV plus assay is intended as an aid in the diagnosis of influenza from Nasopharyngeal swab specimens and should not be used as a sole basis for treatment. Nasal washings and aspirates are unacceptable for Xpert Xpress SARS-CoV-2/FLU/RSV testing.  Fact Sheet for Patients: BloggerCourse.com  Fact Sheet for Healthcare Providers: SeriousBroker.it  This test is not yet approved or cleared by the [place]  FDA and has been authorized for detection and/or diagnosis of SARS-CoV-2 by FDA under an Emergency Use Authorization (EUA). This EUA will remain in effect (meaning this test can be used) for the duration of the COVID-19 declaration under Section 564(b)(1) of the Act, 21 U.S.C. section 360bbb-3(b)(1), unless the authorization is terminated or revoked.     Resp Syncytial Virus by PCR NEGATIVE NEGATIVE Final    Comment: (NOTE) Fact Sheet for  Patients: BloggerCourse.com  Fact Sheet for Healthcare Providers: SeriousBroker.it  This test is not yet approved or cleared by the [place]  FDA and has been authorized for detection and/or diagnosis of SARS-CoV-2 by FDA under an Emergency Use Authorization (EUA). This EUA will remain in effect (meaning this test can be used) for the duration of the COVID-19 declaration under Section 564(b)(1) of the Act, 21 U.S.C. section 360bbb-3(b)(1), unless the authorization is terminated or revoked.  Performed at [facility], [address] [place], [place] [address]   Resp panel by RT-PCR (RSV, Flu A&B, Covid) Anterior Nasal Swab     Status: None   Collection Time: 05/10/24 11:57 AM   Specimen: Anterior Nasal Swab  Result Value Ref Range Status   SARS Coronavirus 2 by RT PCR NEGATIVE NEGATIVE Final   Influenza A by PCR NEGATIVE NEGATIVE Final   Influenza B by PCR NEGATIVE NEGATIVE Final    Comment: (NOTE) The Xpert Xpress SARS-CoV-2/FLU/RSV plus assay is intended as an aid in the diagnosis of  influenza from Nasopharyngeal swab specimens and should not be used as a sole basis for treatment. Nasal washings and aspirates are unacceptable for Xpert Xpress SARS-CoV-2/FLU/RSV testing.  Fact Sheet for Patients: BloggerCourse.com  Fact Sheet for Healthcare Providers: SeriousBroker.it  This test is not yet approved or cleared by the [place]  FDA and has been authorized for detection and/or diagnosis of SARS-CoV-2 by FDA under an Emergency Use Authorization (EUA). This EUA will remain in effect (meaning this test can be used) for the duration of the COVID-19 declaration under Section 564(b)(1) of the Act, 21 U.S.C. section 360bbb-3(b)(1), unless the authorization is terminated or revoked.     Resp Syncytial Virus by PCR NEGATIVE NEGATIVE Final    Comment: (NOTE) Fact Sheet  for Patients: BloggerCourse.com  Fact Sheet for Healthcare Providers: SeriousBroker.it  This test is not yet approved or cleared by the [place]  FDA and has been authorized for detection and/or diagnosis of SARS-CoV-2 by FDA under an Emergency Use Authorization (EUA). This EUA will remain in effect (meaning this test can be used) for the duration of the COVID-19 declaration under Section 564(b)(1) of the Act, 21 U.S.C. section 360bbb-3(b)(1), unless the authorization is terminated or revoked.  Performed at [facility], [address] [place], [place] [address]   MRSA Next Gen by PCR, Nasal     Status: None   Collection Time: 05/10/24  9:06 PM   Specimen: Nasal Mucosa; Nasal Swab  Result Value Ref Range Status   MRSA by PCR Next Gen NOT DETECTED NOT DETECTED Final    Comment: (NOTE) The GeneXpert MRSA Assay (FDA approved for NASAL specimens only), is one component of a comprehensive MRSA colonization surveillance program. It is not intended to diagnose MRSA infection nor to guide or monitor treatment for MRSA infections. Test performance is not FDA approved in patients less than 65 years old. Performed at [facility], [address], [place], [place] [address]     FURTHER DISCHARGE INSTRUCTIONS:  Get Medicines reviewed and adjusted: Please take all your medications with you for your next visit with your Primary MD  Laboratory/radiological data: Please request your Primary MD to go over all hospital tests and procedure/radiological results at the follow up, please ask your Primary MD to get all Hospital records sent to his/her office.  In some cases, they will be blood work, cultures and biopsy results pending at the time of your discharge. Please request that your primary care M.D. goes through all the records of your hospital data and follows up on these results.  Also Note the following: If you  experience worsening of your admission symptoms, develop shortness of breath, life threatening emergency, suicidal or homicidal thoughts you must seek medical attention immediately by calling 911 or calling your MD immediately  if symptoms less severe.  You must read complete instructions/literature along with all the possible adverse reactions/side effects for all the Medicines you take and that have been prescribed to you. Take any new Medicines after you have completely understood and accpet all the possible adverse reactions/side effects.   Do not drive when taking Pain medications or sleeping medications (Benzodaizepines)  Do not take more than prescribed Pain, Sleep and Anxiety Medications. It is not advisable to combine anxiety,sleep and pain medications without talking with your primary care practitioner  Special Instructions: If you have smoked or chewed Tobacco  in the last 2 yrs please stop smoking, stop any regular Alcohol   and or any Recreational drug use.  Wear Seat belts while driving.  Please note: You were cared for by a hospitalist during your hospital stay. Once you are discharged, your primary care physician will handle any further medical issues. Please note that NO REFILLS for any discharge medications will be authorized once you are discharged, as it is imperative that you return to your primary care physician (or establish a relationship with a primary care physician if you do not have one) for your post hospital discharge needs so that they can reassess your need for medications and monitor your lab values.  Total Time spent coordinating discharge including counseling, education and face to face time equals greater than 30 minutes.  SignedBETHA Donalda Applebaum 05/15/2024 10:09 AM

## 2024-05-16 ENCOUNTER — Ambulatory Visit: Admitting: Psychology

## 2024-05-16 DIAGNOSIS — F0280 Dementia in other diseases classified elsewhere without behavioral disturbance: Secondary | ICD-10-CM

## 2024-05-16 DIAGNOSIS — F0150 Vascular dementia without behavioral disturbance: Secondary | ICD-10-CM

## 2024-05-16 DIAGNOSIS — G309 Alzheimer's disease, unspecified: Secondary | ICD-10-CM

## 2024-05-16 NOTE — Progress Notes (Signed)
   Neuropsychology Feedback Session Elizabeth Olsen. [facility] Department of Neurology  Reason for Referral:   Elizabeth Olsen is a 83 y.o. right-handed Hispanic female referred by Camie Sevin, PA-C, to characterize her current cognitive functioning and assist with diagnostic clarity and treatment planning in the context of subjective cognitive decline.   Feedback:   Elizabeth Olsen completed a comprehensive neuropsychological evaluation on 05/08/2024. Please refer to that encounter for the full report and recommendations. Briefly, results suggested diffuse cognitive impairment. An appropriate performance relative to age-matched peers was exhibited across basic attention/concentration. However, fairly severe impairment was exhibited across all other assessed cognitive domains. This includes processing speed, cognitive flexibility, expressive language, visuospatial abilities, and all aspects of learning and memory. Functionally, Elizabeth Olsen no longer drives and her family has fully taken over all medication management, financial management, and bill paying responsibilities. 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   The telephone feedback was completed with Elizabeth Olsen's daughter. While Elizabeth Olsen was present, her daughter noted that she had been hospitalized in the interim, had been placed on hospice care, and was sedated at the present time. Elizabeth Olsen and her daughter were within her residence while I was within my office. I discussed the limitations of evaluation and management by telemedicine and the availability of in person appointments. Elizabeth Olsen daughter expressed her understanding and agreed to proceed. Content of the current session focused on the results of her neuropsychological evaluation. Elizabeth Olsen daughter was given the opportunity to ask questions and  her questions were answered. She was encouraged to reach out should additional questions arise. Her reported is available to her on MyChart.     As Elizabeth Olsen was unable to actively participate in her feedback appointment and the total time was of our telephone call was less than 30 minutes, no billing charges were rendered.

## 2024-05-30 DEATH — deceased

## 2024-06-18 ENCOUNTER — Ambulatory Visit: Admitting: Cardiology

## 2024-07-01 ENCOUNTER — Ambulatory Visit: Admitting: Cardiology

## 2024-07-03 ENCOUNTER — Ambulatory Visit: Admitting: Dermatology

## 2024-08-05 ENCOUNTER — Ambulatory Visit: Admitting: Emergency Medicine
